# Patient Record
Sex: Female | Born: 1945 | Race: Black or African American | Hispanic: No | Smoking: Never smoker
Health system: Southern US, Community
[De-identification: ages and names within clinical notes are randomized; demographics above are authoritative.]

## PROBLEM LIST (undated history)

## (undated) ENCOUNTER — Inpatient Hospital Stay (HOSPITAL_COMMUNITY): Payer: Medicare Other

## (undated) DIAGNOSIS — H409 Unspecified glaucoma: Secondary | ICD-10-CM

## (undated) DIAGNOSIS — R42 Dizziness and giddiness: Secondary | ICD-10-CM

## (undated) DIAGNOSIS — Z5189 Encounter for other specified aftercare: Secondary | ICD-10-CM

## (undated) DIAGNOSIS — C189 Malignant neoplasm of colon, unspecified: Secondary | ICD-10-CM

## (undated) DIAGNOSIS — K2970 Gastritis, unspecified, without bleeding: Secondary | ICD-10-CM

## (undated) DIAGNOSIS — D649 Anemia, unspecified: Secondary | ICD-10-CM

## (undated) DIAGNOSIS — I10 Essential (primary) hypertension: Secondary | ICD-10-CM

## (undated) DIAGNOSIS — K219 Gastro-esophageal reflux disease without esophagitis: Secondary | ICD-10-CM

## (undated) DIAGNOSIS — E7800 Pure hypercholesterolemia, unspecified: Secondary | ICD-10-CM

## (undated) DIAGNOSIS — T7840XA Allergy, unspecified, initial encounter: Secondary | ICD-10-CM

## (undated) DIAGNOSIS — N8110 Cystocele, unspecified: Secondary | ICD-10-CM

## (undated) DIAGNOSIS — J45909 Unspecified asthma, uncomplicated: Secondary | ICD-10-CM

## (undated) DIAGNOSIS — N189 Chronic kidney disease, unspecified: Secondary | ICD-10-CM

## (undated) DIAGNOSIS — D332 Benign neoplasm of brain, unspecified: Secondary | ICD-10-CM

## (undated) HISTORY — DX: Allergy, unspecified, initial encounter: T78.40XA

## (undated) HISTORY — DX: Anemia, unspecified: D64.9

## (undated) HISTORY — PX: CATARACT EXTRACTION, BILATERAL: SHX1313

## (undated) HISTORY — PX: COLONOSCOPY W/ BIOPSIES: SHX1374

## (undated) HISTORY — DX: Dizziness and giddiness: R42

## (undated) HISTORY — DX: Unspecified glaucoma: H40.9

## (undated) HISTORY — DX: Gastro-esophageal reflux disease without esophagitis: K21.9

## (undated) HISTORY — DX: Unspecified asthma, uncomplicated: J45.909

## (undated) HISTORY — DX: Cystocele, unspecified: N81.10

## (undated) HISTORY — DX: Essential (primary) hypertension: I10

## (undated) HISTORY — DX: Pure hypercholesterolemia, unspecified: E78.00

## (undated) HISTORY — PX: EYE SURGERY: SHX253

## (undated) HISTORY — PX: OTHER SURGICAL HISTORY: SHX169

## (undated) HISTORY — PX: UPPER GASTROINTESTINAL ENDOSCOPY: SHX188

## (undated) HISTORY — PX: TMJ ARTHROPLASTY: SHX1066

## (undated) HISTORY — DX: Encounter for other specified aftercare: Z51.89

## (undated) HISTORY — DX: Gastritis, unspecified, without bleeding: K29.70

## (undated) HISTORY — DX: Benign neoplasm of brain, unspecified: D33.2

## (undated) HISTORY — PX: COLONOSCOPY: SHX174

## (undated) HISTORY — DX: Malignant neoplasm of colon, unspecified: C18.9

---

## 1974-04-11 HISTORY — PX: ABDOMINAL HYSTERECTOMY: SHX81

## 1995-04-12 HISTORY — PX: LAPAROSCOPIC NISSEN FUNDOPLICATION: SHX1932

## 1998-07-15 ENCOUNTER — Encounter: Payer: Self-pay | Admitting: Emergency Medicine

## 1998-07-15 ENCOUNTER — Emergency Department (HOSPITAL_COMMUNITY): Admission: EM | Admit: 1998-07-15 | Discharge: 1998-07-15 | Payer: Self-pay | Admitting: Emergency Medicine

## 1999-04-26 ENCOUNTER — Emergency Department (HOSPITAL_COMMUNITY): Admission: EM | Admit: 1999-04-26 | Discharge: 1999-04-26 | Payer: Self-pay | Admitting: Emergency Medicine

## 1999-04-29 ENCOUNTER — Encounter: Admission: RE | Admit: 1999-04-29 | Discharge: 1999-04-29 | Payer: Self-pay | Admitting: Occupational Medicine

## 1999-04-29 ENCOUNTER — Encounter: Payer: Self-pay | Admitting: Occupational Medicine

## 1999-06-02 ENCOUNTER — Encounter: Admission: RE | Admit: 1999-06-02 | Discharge: 1999-06-22 | Payer: Self-pay | Admitting: Occupational Medicine

## 1999-06-21 ENCOUNTER — Encounter: Admission: RE | Admit: 1999-06-21 | Discharge: 1999-06-21 | Payer: Self-pay | Admitting: *Deleted

## 1999-06-21 ENCOUNTER — Encounter: Payer: Self-pay | Admitting: *Deleted

## 1999-08-04 ENCOUNTER — Encounter: Payer: Self-pay | Admitting: Emergency Medicine

## 1999-08-04 ENCOUNTER — Emergency Department (HOSPITAL_COMMUNITY): Admission: EM | Admit: 1999-08-04 | Discharge: 1999-08-04 | Payer: Self-pay | Admitting: Emergency Medicine

## 1999-08-06 ENCOUNTER — Encounter: Admission: RE | Admit: 1999-08-06 | Discharge: 1999-08-06 | Payer: Self-pay | Admitting: Family Medicine

## 1999-08-06 ENCOUNTER — Ambulatory Visit (HOSPITAL_COMMUNITY): Admission: RE | Admit: 1999-08-06 | Discharge: 1999-08-06 | Payer: Self-pay | Admitting: Family Medicine

## 1999-08-11 ENCOUNTER — Encounter: Admission: RE | Admit: 1999-08-11 | Discharge: 1999-11-09 | Payer: Self-pay | Admitting: *Deleted

## 1999-08-20 ENCOUNTER — Encounter: Admission: RE | Admit: 1999-08-20 | Discharge: 1999-08-20 | Payer: Self-pay | Admitting: Family Medicine

## 1999-08-30 ENCOUNTER — Encounter: Admission: RE | Admit: 1999-08-30 | Discharge: 1999-08-30 | Payer: Self-pay | Admitting: Family Medicine

## 1999-08-31 ENCOUNTER — Encounter: Admission: RE | Admit: 1999-08-31 | Discharge: 1999-08-31 | Payer: Self-pay | Admitting: Sports Medicine

## 1999-09-28 ENCOUNTER — Ambulatory Visit (HOSPITAL_COMMUNITY): Admission: RE | Admit: 1999-09-28 | Discharge: 1999-09-28 | Payer: Self-pay | Admitting: Gastroenterology

## 1999-11-23 ENCOUNTER — Emergency Department (HOSPITAL_COMMUNITY): Admission: EM | Admit: 1999-11-23 | Discharge: 1999-11-23 | Payer: Self-pay | Admitting: Emergency Medicine

## 1999-12-01 ENCOUNTER — Ambulatory Visit (HOSPITAL_COMMUNITY): Admission: RE | Admit: 1999-12-01 | Discharge: 1999-12-01 | Payer: Self-pay | Admitting: Gastroenterology

## 1999-12-01 ENCOUNTER — Encounter: Payer: Self-pay | Admitting: Gastroenterology

## 2000-01-27 ENCOUNTER — Ambulatory Visit (HOSPITAL_COMMUNITY): Admission: RE | Admit: 2000-01-27 | Discharge: 2000-01-27 | Payer: Self-pay | Admitting: Gastroenterology

## 2000-01-27 ENCOUNTER — Encounter: Payer: Self-pay | Admitting: Gastroenterology

## 2000-08-17 ENCOUNTER — Encounter: Admission: RE | Admit: 2000-08-17 | Discharge: 2000-08-17 | Payer: Self-pay | Admitting: Family Medicine

## 2000-12-01 ENCOUNTER — Encounter: Admission: RE | Admit: 2000-12-01 | Discharge: 2000-12-01 | Payer: Self-pay | Admitting: Family Medicine

## 2001-01-10 ENCOUNTER — Encounter: Admission: RE | Admit: 2001-01-10 | Discharge: 2001-01-10 | Payer: Self-pay | Admitting: Sports Medicine

## 2001-01-31 ENCOUNTER — Encounter: Payer: Self-pay | Admitting: Sports Medicine

## 2001-01-31 ENCOUNTER — Encounter: Admission: RE | Admit: 2001-01-31 | Discharge: 2001-01-31 | Payer: Self-pay | Admitting: Family Medicine

## 2001-01-31 ENCOUNTER — Encounter: Admission: RE | Admit: 2001-01-31 | Discharge: 2001-01-31 | Payer: Self-pay | Admitting: Sports Medicine

## 2001-02-15 ENCOUNTER — Encounter: Admission: RE | Admit: 2001-02-15 | Discharge: 2001-02-15 | Payer: Self-pay | Admitting: Family Medicine

## 2001-02-20 ENCOUNTER — Encounter: Admission: RE | Admit: 2001-02-20 | Discharge: 2001-02-20 | Payer: Self-pay | Admitting: Sports Medicine

## 2001-02-20 ENCOUNTER — Encounter: Payer: Self-pay | Admitting: Sports Medicine

## 2001-05-07 ENCOUNTER — Encounter: Admission: RE | Admit: 2001-05-07 | Discharge: 2001-05-07 | Payer: Self-pay | Admitting: Sports Medicine

## 2001-05-07 ENCOUNTER — Inpatient Hospital Stay (HOSPITAL_COMMUNITY): Admission: AD | Admit: 2001-05-07 | Discharge: 2001-05-09 | Payer: Self-pay | Admitting: Family Medicine

## 2001-05-07 ENCOUNTER — Ambulatory Visit (HOSPITAL_COMMUNITY): Admission: RE | Admit: 2001-05-07 | Discharge: 2001-05-07 | Payer: Self-pay | Admitting: Sports Medicine

## 2001-05-09 ENCOUNTER — Ambulatory Visit (HOSPITAL_COMMUNITY): Admission: RE | Admit: 2001-05-09 | Discharge: 2001-05-09 | Payer: Self-pay | Admitting: Sports Medicine

## 2001-05-14 ENCOUNTER — Encounter: Payer: Self-pay | Admitting: Emergency Medicine

## 2001-05-14 ENCOUNTER — Emergency Department (HOSPITAL_COMMUNITY): Admission: EM | Admit: 2001-05-14 | Discharge: 2001-05-14 | Payer: Self-pay | Admitting: Emergency Medicine

## 2001-05-16 ENCOUNTER — Encounter: Admission: RE | Admit: 2001-05-16 | Discharge: 2001-05-16 | Payer: Self-pay | Admitting: Family Medicine

## 2001-05-23 ENCOUNTER — Encounter: Admission: RE | Admit: 2001-05-23 | Discharge: 2001-05-23 | Payer: Self-pay | Admitting: Family Medicine

## 2001-06-20 ENCOUNTER — Encounter: Admission: RE | Admit: 2001-06-20 | Discharge: 2001-06-20 | Payer: Self-pay | Admitting: Family Medicine

## 2001-07-16 ENCOUNTER — Encounter: Admission: RE | Admit: 2001-07-16 | Discharge: 2001-07-16 | Payer: Self-pay | Admitting: Family Medicine

## 2001-07-26 ENCOUNTER — Encounter: Admission: RE | Admit: 2001-07-26 | Discharge: 2001-07-26 | Payer: Self-pay | Admitting: Sports Medicine

## 2001-08-09 ENCOUNTER — Emergency Department (HOSPITAL_COMMUNITY): Admission: EM | Admit: 2001-08-09 | Discharge: 2001-08-09 | Payer: Self-pay | Admitting: Emergency Medicine

## 2001-10-23 DIAGNOSIS — K621 Rectal polyp: Secondary | ICD-10-CM

## 2001-10-23 DIAGNOSIS — K620 Anal polyp: Secondary | ICD-10-CM | POA: Insufficient documentation

## 2001-10-23 DIAGNOSIS — K644 Residual hemorrhoidal skin tags: Secondary | ICD-10-CM

## 2001-10-23 HISTORY — DX: Residual hemorrhoidal skin tags: K64.4

## 2002-02-28 ENCOUNTER — Ambulatory Visit (HOSPITAL_COMMUNITY): Admission: RE | Admit: 2002-02-28 | Discharge: 2002-02-28 | Payer: Self-pay | Admitting: Family Medicine

## 2002-02-28 ENCOUNTER — Encounter: Admission: RE | Admit: 2002-02-28 | Discharge: 2002-02-28 | Payer: Self-pay | Admitting: Family Medicine

## 2002-03-15 ENCOUNTER — Encounter: Admission: RE | Admit: 2002-03-15 | Discharge: 2002-03-15 | Payer: Self-pay | Admitting: Family Medicine

## 2002-03-15 ENCOUNTER — Encounter: Payer: Self-pay | Admitting: Family Medicine

## 2002-04-15 ENCOUNTER — Encounter: Admission: RE | Admit: 2002-04-15 | Discharge: 2002-04-15 | Payer: Self-pay | Admitting: Family Medicine

## 2002-04-23 ENCOUNTER — Encounter: Admission: RE | Admit: 2002-04-23 | Discharge: 2002-04-23 | Payer: Self-pay | Admitting: Family Medicine

## 2002-09-05 ENCOUNTER — Encounter: Admission: RE | Admit: 2002-09-05 | Discharge: 2002-09-05 | Payer: Self-pay | Admitting: Family Medicine

## 2002-09-12 ENCOUNTER — Encounter: Admission: RE | Admit: 2002-09-12 | Discharge: 2002-09-12 | Payer: Self-pay | Admitting: Sports Medicine

## 2002-09-13 ENCOUNTER — Ambulatory Visit (HOSPITAL_COMMUNITY): Admission: RE | Admit: 2002-09-13 | Discharge: 2002-09-13 | Payer: Self-pay | Admitting: Sports Medicine

## 2002-09-19 ENCOUNTER — Encounter: Admission: RE | Admit: 2002-09-19 | Discharge: 2002-09-19 | Payer: Self-pay | Admitting: Family Medicine

## 2002-10-02 ENCOUNTER — Encounter: Admission: RE | Admit: 2002-10-02 | Discharge: 2002-10-02 | Payer: Self-pay | Admitting: Family Medicine

## 2003-01-07 ENCOUNTER — Encounter: Admission: RE | Admit: 2003-01-07 | Discharge: 2003-01-07 | Payer: Self-pay | Admitting: Sports Medicine

## 2003-02-21 ENCOUNTER — Inpatient Hospital Stay (HOSPITAL_COMMUNITY): Admission: AD | Admit: 2003-02-21 | Discharge: 2003-02-23 | Payer: Self-pay | Admitting: Family Medicine

## 2003-02-21 ENCOUNTER — Encounter: Admission: RE | Admit: 2003-02-21 | Discharge: 2003-02-21 | Payer: Self-pay | Admitting: Family Medicine

## 2003-03-03 ENCOUNTER — Encounter: Admission: RE | Admit: 2003-03-03 | Discharge: 2003-03-03 | Payer: Self-pay | Admitting: Family Medicine

## 2003-03-10 ENCOUNTER — Encounter: Admission: RE | Admit: 2003-03-10 | Discharge: 2003-03-10 | Payer: Self-pay | Admitting: Family Medicine

## 2003-03-21 ENCOUNTER — Encounter: Admission: RE | Admit: 2003-03-21 | Discharge: 2003-03-21 | Payer: Self-pay | Admitting: Sports Medicine

## 2003-04-12 HISTORY — PX: CARDIAC CATHETERIZATION: SHX172

## 2003-09-16 ENCOUNTER — Emergency Department (HOSPITAL_COMMUNITY): Admission: EM | Admit: 2003-09-16 | Discharge: 2003-09-16 | Payer: Self-pay | Admitting: Emergency Medicine

## 2004-03-26 ENCOUNTER — Ambulatory Visit: Payer: Self-pay | Admitting: Sports Medicine

## 2004-03-26 ENCOUNTER — Ambulatory Visit (HOSPITAL_COMMUNITY): Admission: RE | Admit: 2004-03-26 | Discharge: 2004-03-26 | Payer: Self-pay | Admitting: Sports Medicine

## 2004-03-30 ENCOUNTER — Ambulatory Visit: Payer: Self-pay | Admitting: Sports Medicine

## 2004-03-30 ENCOUNTER — Ambulatory Visit (HOSPITAL_COMMUNITY): Admission: RE | Admit: 2004-03-30 | Discharge: 2004-03-30 | Payer: Self-pay | Admitting: Sports Medicine

## 2004-03-30 ENCOUNTER — Ambulatory Visit: Payer: Self-pay | Admitting: Internal Medicine

## 2004-03-31 ENCOUNTER — Ambulatory Visit (HOSPITAL_COMMUNITY): Admission: RE | Admit: 2004-03-31 | Discharge: 2004-03-31 | Payer: Self-pay | Admitting: Internal Medicine

## 2004-03-31 ENCOUNTER — Ambulatory Visit: Payer: Self-pay | Admitting: Cardiology

## 2004-04-15 ENCOUNTER — Ambulatory Visit: Payer: Self-pay | Admitting: Family Medicine

## 2004-04-16 ENCOUNTER — Encounter: Admission: RE | Admit: 2004-04-16 | Discharge: 2004-04-16 | Payer: Self-pay | Admitting: Sports Medicine

## 2004-04-29 ENCOUNTER — Ambulatory Visit: Payer: Self-pay | Admitting: Family Medicine

## 2004-04-29 ENCOUNTER — Encounter: Admission: RE | Admit: 2004-04-29 | Discharge: 2004-04-29 | Payer: Self-pay | Admitting: Sports Medicine

## 2004-05-13 ENCOUNTER — Ambulatory Visit: Payer: Self-pay | Admitting: Family Medicine

## 2004-06-04 ENCOUNTER — Ambulatory Visit: Payer: Self-pay | Admitting: Family Medicine

## 2004-06-07 ENCOUNTER — Encounter: Admission: RE | Admit: 2004-06-07 | Discharge: 2004-06-07 | Payer: Self-pay | Admitting: Family Medicine

## 2004-06-21 ENCOUNTER — Ambulatory Visit: Payer: Self-pay | Admitting: Sports Medicine

## 2004-12-17 ENCOUNTER — Ambulatory Visit: Payer: Self-pay | Admitting: Family Medicine

## 2004-12-23 ENCOUNTER — Ambulatory Visit (HOSPITAL_COMMUNITY): Admission: RE | Admit: 2004-12-23 | Discharge: 2004-12-23 | Payer: Self-pay | Admitting: Family Medicine

## 2004-12-23 ENCOUNTER — Encounter (INDEPENDENT_AMBULATORY_CARE_PROVIDER_SITE_OTHER): Payer: Self-pay | Admitting: *Deleted

## 2004-12-23 DIAGNOSIS — Z5189 Encounter for other specified aftercare: Secondary | ICD-10-CM

## 2004-12-23 HISTORY — DX: Encounter for other specified aftercare: Z51.89

## 2004-12-27 ENCOUNTER — Encounter: Admission: RE | Admit: 2004-12-27 | Discharge: 2004-12-27 | Payer: Self-pay | Admitting: Gastroenterology

## 2004-12-27 ENCOUNTER — Encounter: Payer: Self-pay | Admitting: Internal Medicine

## 2005-01-07 ENCOUNTER — Ambulatory Visit: Payer: Self-pay | Admitting: Family Medicine

## 2005-01-18 ENCOUNTER — Ambulatory Visit: Payer: Self-pay | Admitting: Sports Medicine

## 2005-03-07 ENCOUNTER — Encounter (INDEPENDENT_AMBULATORY_CARE_PROVIDER_SITE_OTHER): Payer: Self-pay | Admitting: *Deleted

## 2005-03-07 ENCOUNTER — Inpatient Hospital Stay (HOSPITAL_COMMUNITY): Admission: RE | Admit: 2005-03-07 | Discharge: 2005-03-15 | Payer: Self-pay | Admitting: General Surgery

## 2005-03-07 DIAGNOSIS — C189 Malignant neoplasm of colon, unspecified: Secondary | ICD-10-CM

## 2005-03-07 HISTORY — PX: RIGHT COLECTOMY: SHX853

## 2005-03-07 HISTORY — DX: Malignant neoplasm of colon, unspecified: C18.9

## 2005-03-29 ENCOUNTER — Ambulatory Visit: Payer: Self-pay | Admitting: Hematology and Oncology

## 2005-04-18 ENCOUNTER — Ambulatory Visit (HOSPITAL_COMMUNITY): Admission: RE | Admit: 2005-04-18 | Discharge: 2005-04-18 | Payer: Self-pay | Admitting: General Surgery

## 2005-04-22 ENCOUNTER — Ambulatory Visit (HOSPITAL_COMMUNITY): Admission: RE | Admit: 2005-04-22 | Discharge: 2005-04-22 | Payer: Self-pay | Admitting: Hematology and Oncology

## 2005-05-13 ENCOUNTER — Ambulatory Visit: Payer: Self-pay | Admitting: Hematology and Oncology

## 2005-07-01 ENCOUNTER — Ambulatory Visit: Payer: Self-pay | Admitting: Family Medicine

## 2005-07-05 ENCOUNTER — Ambulatory Visit: Payer: Self-pay | Admitting: Hematology and Oncology

## 2005-07-20 LAB — CBC WITH DIFFERENTIAL/PLATELET
BASO%: 1 % (ref 0.0–2.0)
Basophils Absolute: 0.1 10*3/uL (ref 0.0–0.1)
EOS%: 0.7 % (ref 0.0–7.0)
Eosinophils Absolute: 0.1 10*3/uL (ref 0.0–0.5)
HCT: 42.1 % (ref 34.8–46.6)
HGB: 14.3 g/dL (ref 11.6–15.9)
LYMPH%: 31 % (ref 14.0–48.0)
MCH: 30.9 pg (ref 26.0–34.0)
MCHC: 33.9 g/dL (ref 32.0–36.0)
MCV: 0 fL — ABNORMAL LOW (ref 81.0–101.0)
MONO#: 0.8 10*3/uL (ref 0.1–0.9)
MONO%: 8.4 % (ref 0.0–13.0)
NEUT#: 5.4 10*3/uL (ref 1.5–6.5)
NEUT%: 58.8 % (ref 39.6–76.8)
Platelets: ADEQUATE 10*3/uL (ref 145–400)
RBC: 4.61 10*6/uL (ref 3.70–5.32)
RDW: 14.5 % (ref 11.3–14.5)
WBC: 9.2 10*3/uL (ref 3.9–10.0)
lymph#: 2.9 10*3/uL (ref 0.9–3.3)

## 2005-07-20 LAB — COMPREHENSIVE METABOLIC PANEL
ALT: <8 U/L (ref 0–40)
AST: 11 U/L (ref 0–37)
Albumin: 4.2 g/dL (ref 3.5–5.2)
Alkaline Phosphatase: 95 U/L (ref 39–117)
BUN: 7 mg/dL (ref 6–23)
CO2: 26 mEq/L (ref 19–32)
Calcium: 9.3 mg/dL (ref 8.4–10.5)
Chloride: 101 mEq/L (ref 96–112)
Creatinine, Ser: 0.6 mg/dL (ref 0.4–1.2)
Glucose, Bld: 177 mg/dL — ABNORMAL HIGH (ref 70–99)
Potassium: 3.4 mEq/L — ABNORMAL LOW (ref 3.5–5.3)
Sodium: 141 mEq/L (ref 135–145)
Total Bilirubin: 0.6 mg/dL (ref 0.3–1.2)
Total Protein: 6.8 g/dL (ref 6.0–8.3)

## 2005-07-20 LAB — MAGNESIUM: Magnesium: 1.8 mg/dL (ref 1.5–2.5)

## 2005-08-03 LAB — CBC WITH DIFFERENTIAL/PLATELET
BASO%: 0.4 % (ref 0.0–2.0)
Basophils Absolute: 0 10*3/uL (ref 0.0–0.1)
EOS%: 1.1 % (ref 0.0–7.0)
Eosinophils Absolute: 0.1 10*3/uL (ref 0.0–0.5)
HCT: 39.5 % (ref 34.8–46.6)
HGB: 13.3 g/dL (ref 11.6–15.9)
LYMPH%: 29.5 % (ref 14.0–48.0)
MCH: 30.7 pg (ref 26.0–34.0)
MCHC: 33.6 g/dL (ref 32.0–36.0)
MCV: 91.4 fL (ref 81.0–101.0)
MONO#: 0.7 10*3/uL (ref 0.1–0.9)
MONO%: 7.3 % (ref 0.0–13.0)
NEUT#: 5.6 10*3/uL (ref 1.5–6.5)
NEUT%: 61.7 % (ref 39.6–76.8)
Platelets: 252 10*3/uL (ref 145–400)
RBC: 4.32 10*6/uL (ref 3.70–5.32)
RDW: 15 % — ABNORMAL HIGH (ref 11.3–14.5)
WBC: 9 10*3/uL (ref 3.9–10.0)
lymph#: 2.7 10*3/uL (ref 0.9–3.3)

## 2005-08-03 LAB — COMPREHENSIVE METABOLIC PANEL
ALT: 8 U/L (ref 0–40)
AST: 10 U/L (ref 0–37)
Albumin: 4 g/dL (ref 3.5–5.2)
Alkaline Phosphatase: 100 U/L (ref 39–117)
BUN: 11 mg/dL (ref 6–23)
CO2: 27 mEq/L (ref 19–32)
Calcium: 9.5 mg/dL (ref 8.4–10.5)
Chloride: 102 mEq/L (ref 96–112)
Creatinine, Ser: 0.7 mg/dL (ref 0.4–1.2)
Glucose, Bld: 211 mg/dL — ABNORMAL HIGH (ref 70–99)
Potassium: 4.1 mEq/L (ref 3.5–5.3)
Sodium: 141 mEq/L (ref 135–145)
Total Bilirubin: 0.6 mg/dL (ref 0.3–1.2)
Total Protein: 6.7 g/dL (ref 6.0–8.3)

## 2005-08-03 LAB — MAGNESIUM: Magnesium: 1.7 mg/dL (ref 1.5–2.5)

## 2005-08-17 LAB — CBC WITH DIFFERENTIAL/PLATELET
BASO%: 0.4 % (ref 0.0–2.0)
Basophils Absolute: 0 10*3/uL (ref 0.0–0.1)
EOS%: 0.8 % (ref 0.0–7.0)
Eosinophils Absolute: 0.1 10*3/uL (ref 0.0–0.5)
HCT: 37.6 % (ref 34.8–46.6)
HGB: 12.4 g/dL (ref 11.6–15.9)
LYMPH%: 24.9 % (ref 14.0–48.0)
MCH: 30.4 pg (ref 26.0–34.0)
MCHC: 33.1 g/dL (ref 32.0–36.0)
MCV: 91.8 fL (ref 81.0–101.0)
MONO#: 0.8 10*3/uL (ref 0.1–0.9)
MONO%: 9.4 % (ref 0.0–13.0)
NEUT#: 5.6 10*3/uL (ref 1.5–6.5)
NEUT%: 64.5 % (ref 39.6–76.8)
Platelets: 249 10*3/uL (ref 145–400)
RBC: 4.09 10*6/uL (ref 3.70–5.32)
RDW: 15.6 % — ABNORMAL HIGH (ref 11.3–14.5)
WBC: 8.7 10*3/uL (ref 3.9–10.0)
lymph#: 2.2 10*3/uL (ref 0.9–3.3)

## 2005-08-17 LAB — COMPREHENSIVE METABOLIC PANEL
ALT: <8 U/L (ref 0–40)
AST: 12 U/L (ref 0–37)
Albumin: 3.9 g/dL (ref 3.5–5.2)
Alkaline Phosphatase: 89 U/L (ref 39–117)
BUN: 11 mg/dL (ref 6–23)
CO2: 26 mEq/L (ref 19–32)
Calcium: 9.3 mg/dL (ref 8.4–10.5)
Chloride: 104 mEq/L (ref 96–112)
Creatinine, Ser: 0.6 mg/dL (ref 0.4–1.2)
Glucose, Bld: 207 mg/dL — ABNORMAL HIGH (ref 70–99)
Potassium: 3.7 mEq/L (ref 3.5–5.3)
Sodium: 141 mEq/L (ref 135–145)
Total Bilirubin: 0.6 mg/dL (ref 0.3–1.2)
Total Protein: 6.5 g/dL (ref 6.0–8.3)

## 2005-08-17 LAB — MAGNESIUM: Magnesium: 1.8 mg/dL (ref 1.5–2.5)

## 2005-08-19 ENCOUNTER — Ambulatory Visit: Payer: Self-pay | Admitting: Hematology and Oncology

## 2005-08-31 LAB — CBC WITH DIFFERENTIAL/PLATELET
BASO%: 0.4 % (ref 0.0–2.0)
Basophils Absolute: 0 10*3/uL (ref 0.0–0.1)
EOS%: 1.4 % (ref 0.0–7.0)
Eosinophils Absolute: 0.1 10*3/uL (ref 0.0–0.5)
HCT: 39 % (ref 34.8–46.6)
HGB: 12.9 g/dL (ref 11.6–15.9)
LYMPH%: 24.7 % (ref 14.0–48.0)
MCH: 30.3 pg (ref 26.0–34.0)
MCHC: 33.1 g/dL (ref 32.0–36.0)
MCV: 91.3 fL (ref 81.0–101.0)
MONO#: 0.6 10*3/uL (ref 0.1–0.9)
MONO%: 7.6 % (ref 0.0–13.0)
NEUT#: 5.4 10*3/uL (ref 1.5–6.5)
NEUT%: 65.9 % (ref 39.6–76.8)
Platelets: 258 10*3/uL (ref 145–400)
RBC: 4.27 10*6/uL (ref 3.70–5.32)
RDW: 15.1 % — ABNORMAL HIGH (ref 11.3–14.5)
WBC: 8.2 10*3/uL (ref 3.9–10.0)
lymph#: 2 10*3/uL (ref 0.9–3.3)

## 2005-08-31 LAB — COMPREHENSIVE METABOLIC PANEL
ALT: <8 U/L (ref 0–40)
AST: 9 U/L (ref 0–37)
Albumin: 3.8 g/dL (ref 3.5–5.2)
Alkaline Phosphatase: 104 U/L (ref 39–117)
BUN: 13 mg/dL (ref 6–23)
CO2: 27 mEq/L (ref 19–32)
Calcium: 8.8 mg/dL (ref 8.4–10.5)
Chloride: 104 mEq/L (ref 96–112)
Creatinine, Ser: 0.7 mg/dL (ref 0.4–1.2)
Glucose, Bld: 223 mg/dL — ABNORMAL HIGH (ref 70–99)
Potassium: 3.8 mEq/L (ref 3.5–5.3)
Sodium: 142 mEq/L (ref 135–145)
Total Bilirubin: 0.8 mg/dL (ref 0.3–1.2)
Total Protein: 6.4 g/dL (ref 6.0–8.3)

## 2005-09-14 LAB — COMPREHENSIVE METABOLIC PANEL
ALT: 8 U/L (ref 0–40)
AST: 10 U/L (ref 0–37)
Albumin: 4.1 g/dL (ref 3.5–5.2)
Alkaline Phosphatase: 100 U/L (ref 39–117)
BUN: 14 mg/dL (ref 6–23)
CO2: 26 mEq/L (ref 19–32)
Calcium: 9.4 mg/dL (ref 8.4–10.5)
Chloride: 105 mEq/L (ref 96–112)
Creatinine, Ser: 0.71 mg/dL (ref 0.40–1.20)
Glucose, Bld: 220 mg/dL — ABNORMAL HIGH (ref 70–99)
Potassium: 3.3 mEq/L — ABNORMAL LOW (ref 3.5–5.3)
Sodium: 143 mEq/L (ref 135–145)
Total Bilirubin: 0.8 mg/dL (ref 0.3–1.2)
Total Protein: 6.8 g/dL (ref 6.0–8.3)

## 2005-09-14 LAB — CBC WITH DIFFERENTIAL/PLATELET
BASO%: 0.4 % (ref 0.0–2.0)
Basophils Absolute: 0 10*3/uL (ref 0.0–0.1)
EOS%: 1.2 % (ref 0.0–7.0)
Eosinophils Absolute: 0.1 10*3/uL (ref 0.0–0.5)
HCT: 39.5 % (ref 34.8–46.6)
HGB: 13.2 g/dL (ref 11.6–15.9)
LYMPH%: 22.6 % (ref 14.0–48.0)
MCH: 30.3 pg (ref 26.0–34.0)
MCHC: 33.3 g/dL (ref 32.0–36.0)
MCV: 90.8 fL (ref 81.0–101.0)
MONO#: 0.7 10*3/uL (ref 0.1–0.9)
MONO%: 7.9 % (ref 0.0–13.0)
NEUT#: 5.8 10*3/uL (ref 1.5–6.5)
NEUT%: 67.9 % (ref 39.6–76.8)
Platelets: 261 10*3/uL (ref 145–400)
RBC: 4.35 10*6/uL (ref 3.70–5.32)
RDW: 14.9 % — ABNORMAL HIGH (ref 11.3–14.5)
WBC: 8.5 10*3/uL (ref 3.9–10.0)
lymph#: 1.9 10*3/uL (ref 0.9–3.3)

## 2005-09-28 LAB — CBC WITH DIFFERENTIAL/PLATELET
BASO%: 0.4 % (ref 0.0–2.0)
Basophils Absolute: 0 10*3/uL (ref 0.0–0.1)
EOS%: 1.1 % (ref 0.0–7.0)
Eosinophils Absolute: 0.1 10*3/uL (ref 0.0–0.5)
HCT: 40 % (ref 34.8–46.6)
HGB: 13.5 g/dL (ref 11.6–15.9)
LYMPH%: 25.1 % (ref 14.0–48.0)
MCH: 30.8 pg (ref 26.0–34.0)
MCHC: 33.8 g/dL (ref 32.0–36.0)
MCV: 91.1 fL (ref 81.0–101.0)
MONO#: 0.6 10*3/uL (ref 0.1–0.9)
MONO%: 8.6 % (ref 0.0–13.0)
NEUT#: 4.9 10*3/uL (ref 1.5–6.5)
NEUT%: 64.8 % (ref 39.6–76.8)
Platelets: 252 10*3/uL (ref 145–400)
RBC: 4.39 10*6/uL (ref 3.70–5.32)
RDW: 15.3 % — ABNORMAL HIGH (ref 11.3–14.5)
WBC: 7.5 10*3/uL (ref 3.9–10.0)
lymph#: 1.9 10*3/uL (ref 0.9–3.3)

## 2005-09-28 LAB — COMPREHENSIVE METABOLIC PANEL
ALT: <8 U/L (ref 0–40)
AST: 11 U/L (ref 0–37)
Albumin: 4 g/dL (ref 3.5–5.2)
Alkaline Phosphatase: 94 U/L (ref 39–117)
BUN: 10 mg/dL (ref 6–23)
CO2: 27 mEq/L (ref 19–32)
Calcium: 9.3 mg/dL (ref 8.4–10.5)
Chloride: 103 mEq/L (ref 96–112)
Creatinine, Ser: 0.66 mg/dL (ref 0.40–1.20)
Glucose, Bld: 246 mg/dL — ABNORMAL HIGH (ref 70–99)
Potassium: 3.7 mEq/L (ref 3.5–5.3)
Sodium: 141 mEq/L (ref 135–145)
Total Bilirubin: 1 mg/dL (ref 0.3–1.2)
Total Protein: 6.7 g/dL (ref 6.0–8.3)

## 2005-09-29 ENCOUNTER — Ambulatory Visit: Payer: Self-pay | Admitting: Hematology and Oncology

## 2005-10-11 LAB — COMPREHENSIVE METABOLIC PANEL
ALT: 8 U/L (ref 0–40)
AST: 10 U/L (ref 0–37)
Albumin: 4.2 g/dL (ref 3.5–5.2)
Alkaline Phosphatase: 103 U/L (ref 39–117)
BUN: 8 mg/dL (ref 6–23)
CO2: 26 mEq/L (ref 19–32)
Calcium: 9.6 mg/dL (ref 8.4–10.5)
Chloride: 103 mEq/L (ref 96–112)
Creatinine, Ser: 0.68 mg/dL (ref 0.40–1.20)
Glucose, Bld: 300 mg/dL — ABNORMAL HIGH (ref 70–99)
Potassium: 3.6 mEq/L (ref 3.5–5.3)
Sodium: 141 mEq/L (ref 135–145)
Total Bilirubin: 0.8 mg/dL (ref 0.3–1.2)
Total Protein: 7 g/dL (ref 6.0–8.3)

## 2005-10-11 LAB — CBC WITH DIFFERENTIAL/PLATELET
BASO%: 0.3 % (ref 0.0–2.0)
Basophils Absolute: 0 10*3/uL (ref 0.0–0.1)
EOS%: 0.7 % (ref 0.0–7.0)
Eosinophils Absolute: 0.1 10*3/uL (ref 0.0–0.5)
HCT: 40.3 % (ref 34.8–46.6)
HGB: 13.6 g/dL (ref 11.6–15.9)
LYMPH%: 23.5 % (ref 14.0–48.0)
MCH: 31.1 pg (ref 26.0–34.0)
MCHC: 33.7 g/dL (ref 32.0–36.0)
MCV: 92.3 fL (ref 81.0–101.0)
MONO#: 0.6 10*3/uL (ref 0.1–0.9)
MONO%: 8 % (ref 0.0–13.0)
NEUT#: 5.4 10*3/uL (ref 1.5–6.5)
NEUT%: 67.5 % (ref 39.6–76.8)
Platelets: 257 10*3/uL (ref 145–400)
RBC: 4.36 10*6/uL (ref 3.70–5.32)
RDW: 15.2 % — ABNORMAL HIGH (ref 11.3–14.5)
WBC: 8.1 10*3/uL (ref 3.9–10.0)
lymph#: 1.9 10*3/uL (ref 0.9–3.3)

## 2005-11-09 LAB — COMPREHENSIVE METABOLIC PANEL
ALT: 9 U/L (ref 0–40)
AST: 11 U/L (ref 0–37)
Albumin: 4.2 g/dL (ref 3.5–5.2)
Alkaline Phosphatase: 100 U/L (ref 39–117)
BUN: 10 mg/dL (ref 6–23)
CO2: 23 mEq/L (ref 19–32)
Calcium: 9.8 mg/dL (ref 8.4–10.5)
Chloride: 100 mEq/L (ref 96–112)
Creatinine, Ser: 0.64 mg/dL (ref 0.40–1.20)
Glucose, Bld: 357 mg/dL — ABNORMAL HIGH (ref 70–99)
Potassium: 3.2 mEq/L — ABNORMAL LOW (ref 3.5–5.3)
Sodium: 138 mEq/L (ref 135–145)
Total Bilirubin: 0.7 mg/dL (ref 0.3–1.2)
Total Protein: 6.7 g/dL (ref 6.0–8.3)

## 2005-11-09 LAB — CBC WITH DIFFERENTIAL/PLATELET
BASO%: 0.7 % (ref 0.0–2.0)
Basophils Absolute: 0.1 10*3/uL (ref 0.0–0.1)
EOS%: 0.7 % (ref 0.0–7.0)
Eosinophils Absolute: 0.1 10*3/uL (ref 0.0–0.5)
HCT: 40.9 % (ref 34.8–46.6)
HGB: 14.1 g/dL (ref 11.6–15.9)
LYMPH%: 27.1 % (ref 14.0–48.0)
MCH: 31.2 pg (ref 26.0–34.0)
MCHC: 34.3 g/dL (ref 32.0–36.0)
MCV: 90.8 fL (ref 81.0–101.0)
MONO#: 0.4 10*3/uL (ref 0.1–0.9)
MONO%: 5.5 % (ref 0.0–13.0)
NEUT#: 5 10*3/uL (ref 1.5–6.5)
NEUT%: 66 % (ref 39.6–76.8)
Platelets: 243 10*3/uL (ref 145–400)
RBC: 4.51 10*6/uL (ref 3.70–5.32)
RDW: 14.1 % (ref 11.3–14.5)
WBC: 7.5 10*3/uL (ref 3.9–10.0)
lymph#: 2 10*3/uL (ref 0.9–3.3)

## 2005-11-10 ENCOUNTER — Ambulatory Visit (HOSPITAL_COMMUNITY): Admission: RE | Admit: 2005-11-10 | Discharge: 2005-11-10 | Payer: Self-pay | Admitting: Hematology and Oncology

## 2005-11-16 ENCOUNTER — Ambulatory Visit: Payer: Self-pay | Admitting: Hematology and Oncology

## 2006-02-08 ENCOUNTER — Ambulatory Visit: Payer: Self-pay | Admitting: Hematology and Oncology

## 2006-03-21 LAB — CBC WITH DIFFERENTIAL/PLATELET
BASO%: 0.4 % (ref 0.0–2.0)
Basophils Absolute: 0 10*3/uL (ref 0.0–0.1)
EOS%: 1.5 % (ref 0.0–7.0)
Eosinophils Absolute: 0.1 10*3/uL (ref 0.0–0.5)
HCT: 39 % (ref 34.8–46.6)
HGB: 13.2 g/dL (ref 11.6–15.9)
LYMPH%: 27.8 % (ref 14.0–48.0)
MCH: 29.4 pg (ref 26.0–34.0)
MCHC: 33.9 g/dL (ref 32.0–36.0)
MCV: 86.7 fL (ref 81.0–101.0)
MONO#: 0.5 10*3/uL (ref 0.1–0.9)
MONO%: 6.2 % (ref 0.0–13.0)
NEUT#: 4.9 10*3/uL (ref 1.5–6.5)
NEUT%: 64.1 % (ref 39.6–76.8)
Platelets: 259 10*3/uL (ref 145–400)
RBC: 4.5 10*6/uL (ref 3.70–5.32)
RDW: 12.5 % (ref 11.3–14.5)
WBC: 7.6 10*3/uL (ref 3.9–10.0)
lymph#: 2.1 10*3/uL (ref 0.9–3.3)

## 2006-03-21 LAB — COMPREHENSIVE METABOLIC PANEL
ALT: 11 U/L (ref 0–35)
AST: 10 U/L (ref 0–37)
Albumin: 4.1 g/dL (ref 3.5–5.2)
Alkaline Phosphatase: 118 U/L — ABNORMAL HIGH (ref 39–117)
BUN: 14 mg/dL (ref 6–23)
CO2: 24 mEq/L (ref 19–32)
Calcium: 9 mg/dL (ref 8.4–10.5)
Chloride: 104 mEq/L (ref 96–112)
Creatinine, Ser: 0.63 mg/dL (ref 0.40–1.20)
Glucose, Bld: 287 mg/dL — ABNORMAL HIGH (ref 70–99)
Potassium: 3.7 mEq/L (ref 3.5–5.3)
Sodium: 139 mEq/L (ref 135–145)
Total Bilirubin: 0.4 mg/dL (ref 0.3–1.2)
Total Protein: 6.7 g/dL (ref 6.0–8.3)

## 2006-03-21 LAB — CEA: CEA: 1.5 ng/mL (ref 0.0–5.0)

## 2006-04-27 ENCOUNTER — Ambulatory Visit (HOSPITAL_COMMUNITY): Admission: RE | Admit: 2006-04-27 | Discharge: 2006-04-27 | Payer: Self-pay | Admitting: General Surgery

## 2006-05-31 ENCOUNTER — Ambulatory Visit: Payer: Self-pay | Admitting: Cardiology

## 2006-05-31 ENCOUNTER — Ambulatory Visit: Payer: Self-pay | Admitting: Family Medicine

## 2006-05-31 ENCOUNTER — Encounter: Payer: Self-pay | Admitting: Cardiology

## 2006-05-31 ENCOUNTER — Inpatient Hospital Stay (HOSPITAL_COMMUNITY): Admission: EM | Admit: 2006-05-31 | Discharge: 2006-06-01 | Payer: Self-pay | Admitting: Emergency Medicine

## 2006-05-31 ENCOUNTER — Ambulatory Visit: Payer: Self-pay | Admitting: Internal Medicine

## 2006-06-08 DIAGNOSIS — Z85038 Personal history of other malignant neoplasm of large intestine: Secondary | ICD-10-CM

## 2006-06-08 DIAGNOSIS — N6029 Fibroadenosis of unspecified breast: Secondary | ICD-10-CM | POA: Insufficient documentation

## 2006-06-08 DIAGNOSIS — F329 Major depressive disorder, single episode, unspecified: Secondary | ICD-10-CM | POA: Insufficient documentation

## 2006-06-08 DIAGNOSIS — F3289 Other specified depressive episodes: Secondary | ICD-10-CM

## 2006-06-08 DIAGNOSIS — F411 Generalized anxiety disorder: Secondary | ICD-10-CM | POA: Insufficient documentation

## 2006-06-08 DIAGNOSIS — K219 Gastro-esophageal reflux disease without esophagitis: Secondary | ICD-10-CM | POA: Insufficient documentation

## 2006-06-08 DIAGNOSIS — I2510 Atherosclerotic heart disease of native coronary artery without angina pectoris: Secondary | ICD-10-CM | POA: Insufficient documentation

## 2006-06-08 DIAGNOSIS — D509 Iron deficiency anemia, unspecified: Secondary | ICD-10-CM | POA: Insufficient documentation

## 2006-06-08 HISTORY — DX: Personal history of other malignant neoplasm of large intestine: Z85.038

## 2006-06-08 HISTORY — DX: Other specified depressive episodes: F32.89

## 2006-06-20 ENCOUNTER — Ambulatory Visit: Payer: Self-pay | Admitting: Cardiology

## 2006-06-28 ENCOUNTER — Ambulatory Visit: Payer: Self-pay

## 2006-07-11 ENCOUNTER — Ambulatory Visit: Payer: Self-pay | Admitting: Hematology and Oncology

## 2006-07-14 ENCOUNTER — Encounter (INDEPENDENT_AMBULATORY_CARE_PROVIDER_SITE_OTHER): Payer: Self-pay | Admitting: Family Medicine

## 2006-07-14 ENCOUNTER — Ambulatory Visit: Payer: Self-pay | Admitting: Family Medicine

## 2006-07-14 DIAGNOSIS — E781 Pure hyperglyceridemia: Secondary | ICD-10-CM | POA: Insufficient documentation

## 2006-07-14 DIAGNOSIS — I152 Hypertension secondary to endocrine disorders: Secondary | ICD-10-CM | POA: Insufficient documentation

## 2006-07-14 DIAGNOSIS — E1169 Type 2 diabetes mellitus with other specified complication: Secondary | ICD-10-CM | POA: Insufficient documentation

## 2006-07-14 DIAGNOSIS — J309 Allergic rhinitis, unspecified: Secondary | ICD-10-CM | POA: Insufficient documentation

## 2006-07-14 DIAGNOSIS — I10 Essential (primary) hypertension: Secondary | ICD-10-CM | POA: Insufficient documentation

## 2006-07-14 LAB — COMPREHENSIVE METABOLIC PANEL
ALT: 8 U/L (ref 0–35)
AST: 11 U/L (ref 0–37)
Albumin: 4 g/dL (ref 3.5–5.2)
Alkaline Phosphatase: 105 U/L (ref 39–117)
BUN: 19 mg/dL (ref 6–23)
CO2: 21 mEq/L (ref 19–32)
Calcium: 9.7 mg/dL (ref 8.4–10.5)
Chloride: 103 mEq/L (ref 96–112)
Creatinine, Ser: 0.74 mg/dL (ref 0.40–1.20)
Glucose, Bld: 354 mg/dL — ABNORMAL HIGH (ref 70–99)
Potassium: 3.7 mEq/L (ref 3.5–5.3)
Sodium: 138 mEq/L (ref 135–145)
Total Bilirubin: 0.5 mg/dL (ref 0.3–1.2)
Total Protein: 7 g/dL (ref 6.0–8.3)

## 2006-07-14 LAB — CBC WITH DIFFERENTIAL/PLATELET
BASO%: 0.5 % (ref 0.0–2.0)
Basophils Absolute: 0 10*3/uL (ref 0.0–0.1)
EOS%: 1.4 % (ref 0.0–7.0)
Eosinophils Absolute: 0.1 10*3/uL (ref 0.0–0.5)
HCT: 37.8 % (ref 34.8–46.6)
HGB: 13.2 g/dL (ref 11.6–15.9)
LYMPH%: 25.8 % (ref 14.0–48.0)
MCH: 29.4 pg (ref 26.0–34.0)
MCHC: 34.8 g/dL (ref 32.0–36.0)
MCV: 84.2 fL (ref 81.0–101.0)
MONO#: 0.4 10*3/uL (ref 0.1–0.9)
MONO%: 4.5 % (ref 0.0–13.0)
NEUT#: 5.6 10*3/uL (ref 1.5–6.5)
NEUT%: 67.8 % (ref 39.6–76.8)
Platelets: 249 10*3/uL (ref 145–400)
RBC: 4.48 10*6/uL (ref 3.70–5.32)
RDW: 12.6 % (ref 11.3–14.5)
WBC: 8.3 10*3/uL (ref 3.9–10.0)
lymph#: 2.1 10*3/uL (ref 0.9–3.3)

## 2006-07-14 LAB — CEA: CEA: 1.2 ng/mL (ref 0.0–5.0)

## 2006-07-17 ENCOUNTER — Ambulatory Visit (HOSPITAL_COMMUNITY): Admission: RE | Admit: 2006-07-17 | Discharge: 2006-07-17 | Payer: Self-pay | Admitting: Hematology and Oncology

## 2006-07-31 ENCOUNTER — Encounter (INDEPENDENT_AMBULATORY_CARE_PROVIDER_SITE_OTHER): Payer: Self-pay | Admitting: Family Medicine

## 2006-07-31 ENCOUNTER — Emergency Department (HOSPITAL_COMMUNITY): Admission: EM | Admit: 2006-07-31 | Discharge: 2006-07-31 | Payer: Self-pay | Admitting: Emergency Medicine

## 2006-07-31 LAB — COMPREHENSIVE METABOLIC PANEL
ALT: 9 U/L (ref 0–35)
AST: 12 U/L (ref 0–37)
Albumin: 4.4 g/dL (ref 3.5–5.2)
Alkaline Phosphatase: 121 U/L — ABNORMAL HIGH (ref 39–117)
BUN: 12 mg/dL (ref 6–23)
CO2: 23 mEq/L (ref 19–32)
Calcium: 9.7 mg/dL (ref 8.4–10.5)
Chloride: 102 mEq/L (ref 96–112)
Creatinine, Ser: 0.72 mg/dL (ref 0.40–1.20)
Glucose, Bld: 328 mg/dL — ABNORMAL HIGH (ref 70–99)
Potassium: 3.7 mEq/L (ref 3.5–5.3)
Sodium: 139 mEq/L (ref 135–145)
Total Bilirubin: 0.7 mg/dL (ref 0.3–1.2)
Total Protein: 7.5 g/dL (ref 6.0–8.3)

## 2006-07-31 LAB — CBC WITH DIFFERENTIAL/PLATELET
BASO%: 0.6 % (ref 0.0–2.0)
Basophils Absolute: 0 10*3/uL (ref 0.0–0.1)
EOS%: 1.4 % (ref 0.0–7.0)
Eosinophils Absolute: 0.1 10*3/uL (ref 0.0–0.5)
HCT: 40 % (ref 34.8–46.6)
HGB: 13.6 g/dL (ref 11.6–15.9)
LYMPH%: 25.2 % (ref 14.0–48.0)
MCH: 29.2 pg (ref 26.0–34.0)
MCHC: 34.1 g/dL (ref 32.0–36.0)
MCV: 85.6 fL (ref 81.0–101.0)
MONO#: 0.5 10*3/uL (ref 0.1–0.9)
MONO%: 6.9 % (ref 0.0–13.0)
NEUT#: 5.1 10*3/uL (ref 1.5–6.5)
NEUT%: 65.9 % (ref 39.6–76.8)
Platelets: 233 10*3/uL (ref 145–400)
RBC: 4.67 10*6/uL (ref 3.70–5.32)
RDW: 11.5 % (ref 11.3–14.5)
WBC: 7.7 10*3/uL (ref 3.9–10.0)
lymph#: 1.9 10*3/uL (ref 0.9–3.3)

## 2006-08-15 ENCOUNTER — Ambulatory Visit: Payer: Self-pay | Admitting: Family Medicine

## 2006-08-15 LAB — CONVERTED CEMR LAB
HCT: 39.2 %
Hemoglobin: 13.5 g/dL
MCV: 86.5 fL
Platelets: 240 10*3/uL
RBC: 4.53 M/uL
WBC: 9 10*3/uL

## 2006-09-01 ENCOUNTER — Ambulatory Visit: Payer: Self-pay | Admitting: Family Medicine

## 2006-09-01 LAB — CONVERTED CEMR LAB: Hgb A1c MFr Bld: 8.4 %

## 2006-09-05 ENCOUNTER — Ambulatory Visit: Payer: Self-pay | Admitting: Family Medicine

## 2006-09-06 ENCOUNTER — Ambulatory Visit: Payer: Self-pay | Admitting: Family Medicine

## 2006-11-02 ENCOUNTER — Ambulatory Visit: Payer: Self-pay | Admitting: Internal Medicine

## 2006-11-16 ENCOUNTER — Ambulatory Visit: Payer: Self-pay | Admitting: Hematology and Oncology

## 2006-11-21 LAB — CBC WITH DIFFERENTIAL/PLATELET
BASO%: 0.4 % (ref 0.0–2.0)
Basophils Absolute: 0 10*3/uL (ref 0.0–0.1)
EOS%: 1.2 % (ref 0.0–7.0)
Eosinophils Absolute: 0.1 10*3/uL (ref 0.0–0.5)
HCT: 35.5 % (ref 34.8–46.6)
HGB: 12.4 g/dL (ref 11.6–15.9)
LYMPH%: 25.6 % (ref 14.0–48.0)
MCH: 29.4 pg (ref 26.0–34.0)
MCHC: 34.9 g/dL (ref 32.0–36.0)
MCV: 84.3 fL (ref 81.0–101.0)
MONO#: 0.5 10*3/uL (ref 0.1–0.9)
MONO%: 6.1 % (ref 0.0–13.0)
NEUT#: 5.9 10*3/uL (ref 1.5–6.5)
NEUT%: 66.7 % (ref 39.6–76.8)
Platelets: 245 10*3/uL (ref 145–400)
RBC: 4.22 10*6/uL (ref 3.70–5.32)
RDW: 12.8 % (ref 11.3–14.5)
WBC: 8.8 10*3/uL (ref 3.9–10.0)
lymph#: 2.3 10*3/uL (ref 0.9–3.3)

## 2006-11-22 LAB — COMPREHENSIVE METABOLIC PANEL
ALT: 10 U/L (ref 0–35)
AST: 11 U/L (ref 0–37)
Albumin: 4.1 g/dL (ref 3.5–5.2)
Alkaline Phosphatase: 88 U/L (ref 39–117)
BUN: 17 mg/dL (ref 6–23)
CO2: 23 mEq/L (ref 19–32)
Calcium: 9.7 mg/dL (ref 8.4–10.5)
Chloride: 104 mEq/L (ref 96–112)
Creatinine, Ser: 0.88 mg/dL (ref 0.40–1.20)
Glucose, Bld: 293 mg/dL — ABNORMAL HIGH (ref 70–99)
Potassium: 3.7 mEq/L (ref 3.5–5.3)
Sodium: 140 mEq/L (ref 135–145)
Total Bilirubin: 0.6 mg/dL (ref 0.3–1.2)
Total Protein: 6.9 g/dL (ref 6.0–8.3)

## 2006-11-22 LAB — CEA: CEA: 0.9 ng/mL (ref 0.0–5.0)

## 2006-11-29 ENCOUNTER — Ambulatory Visit (HOSPITAL_COMMUNITY): Admission: RE | Admit: 2006-11-29 | Discharge: 2006-11-29 | Payer: Self-pay | Admitting: Internal Medicine

## 2006-11-29 ENCOUNTER — Encounter (INDEPENDENT_AMBULATORY_CARE_PROVIDER_SITE_OTHER): Payer: Self-pay | Admitting: Family Medicine

## 2006-12-13 ENCOUNTER — Ambulatory Visit: Payer: Self-pay | Admitting: Internal Medicine

## 2006-12-26 ENCOUNTER — Inpatient Hospital Stay (HOSPITAL_COMMUNITY): Admission: EM | Admit: 2006-12-26 | Discharge: 2006-12-28 | Payer: Self-pay | Admitting: Emergency Medicine

## 2007-01-04 ENCOUNTER — Ambulatory Visit (HOSPITAL_COMMUNITY): Admission: RE | Admit: 2007-01-04 | Discharge: 2007-01-04 | Payer: Self-pay | Admitting: Neurosurgery

## 2007-01-21 ENCOUNTER — Encounter: Admission: RE | Admit: 2007-01-21 | Discharge: 2007-01-21 | Payer: Self-pay | Admitting: Neurosurgery

## 2007-01-31 ENCOUNTER — Telehealth (INDEPENDENT_AMBULATORY_CARE_PROVIDER_SITE_OTHER): Payer: Self-pay | Admitting: Family Medicine

## 2007-02-07 ENCOUNTER — Encounter (INDEPENDENT_AMBULATORY_CARE_PROVIDER_SITE_OTHER): Payer: Self-pay | Admitting: Family Medicine

## 2007-02-07 ENCOUNTER — Ambulatory Visit: Payer: Self-pay | Admitting: Internal Medicine

## 2007-02-07 DIAGNOSIS — D320 Benign neoplasm of cerebral meninges: Secondary | ICD-10-CM

## 2007-02-07 HISTORY — DX: Benign neoplasm of cerebral meninges: D32.0

## 2007-02-07 LAB — CONVERTED CEMR LAB
BUN: 15 mg/dL (ref 6–23)
CO2: 24 meq/L (ref 19–32)
Calcium: 9 mg/dL (ref 8.4–10.5)
Chloride: 102 meq/L (ref 96–112)
Creatinine, Ser: 0.85 mg/dL (ref 0.40–1.20)
Glucose, Bld: 301 mg/dL — ABNORMAL HIGH (ref 70–99)
Potassium: 3.8 meq/L (ref 3.5–5.3)
Sodium: 138 meq/L (ref 135–145)

## 2007-02-08 ENCOUNTER — Encounter (INDEPENDENT_AMBULATORY_CARE_PROVIDER_SITE_OTHER): Payer: Self-pay | Admitting: Family Medicine

## 2007-02-12 ENCOUNTER — Telehealth (INDEPENDENT_AMBULATORY_CARE_PROVIDER_SITE_OTHER): Payer: Self-pay | Admitting: Family Medicine

## 2007-03-16 ENCOUNTER — Ambulatory Visit (HOSPITAL_COMMUNITY): Admission: RE | Admit: 2007-03-16 | Discharge: 2007-03-16 | Payer: Self-pay | Admitting: Family Medicine

## 2007-03-16 ENCOUNTER — Ambulatory Visit: Payer: Self-pay | Admitting: Family Medicine

## 2007-03-16 ENCOUNTER — Encounter (INDEPENDENT_AMBULATORY_CARE_PROVIDER_SITE_OTHER): Payer: Self-pay | Admitting: Family Medicine

## 2007-03-16 DIAGNOSIS — E1165 Type 2 diabetes mellitus with hyperglycemia: Secondary | ICD-10-CM | POA: Insufficient documentation

## 2007-03-16 DIAGNOSIS — E1122 Type 2 diabetes mellitus with diabetic chronic kidney disease: Secondary | ICD-10-CM | POA: Insufficient documentation

## 2007-03-16 LAB — CONVERTED CEMR LAB
BUN: 18 mg/dL (ref 6–23)
CO2: 23 meq/L (ref 19–32)
Calcium: 9.9 mg/dL (ref 8.4–10.5)
Chloride: 104 meq/L (ref 96–112)
Creatinine, Ser: 0.75 mg/dL (ref 0.40–1.20)
Glucose, Bld: 254 mg/dL — ABNORMAL HIGH (ref 70–99)
Potassium: 3.7 meq/L (ref 3.5–5.3)
Pro B Natriuretic peptide (BNP): 26 pg/mL (ref 0.0–100.0)
Sodium: 141 meq/L (ref 135–145)

## 2007-03-19 ENCOUNTER — Ambulatory Visit: Payer: Self-pay | Admitting: Family Medicine

## 2007-03-19 ENCOUNTER — Encounter (INDEPENDENT_AMBULATORY_CARE_PROVIDER_SITE_OTHER): Payer: Self-pay | Admitting: Family Medicine

## 2007-03-20 ENCOUNTER — Encounter (INDEPENDENT_AMBULATORY_CARE_PROVIDER_SITE_OTHER): Payer: Self-pay | Admitting: Family Medicine

## 2007-03-20 ENCOUNTER — Ambulatory Visit: Payer: Self-pay | Admitting: Family Medicine

## 2007-03-26 ENCOUNTER — Ambulatory Visit: Payer: Self-pay | Admitting: Hematology and Oncology

## 2007-03-30 ENCOUNTER — Ambulatory Visit (HOSPITAL_COMMUNITY): Admission: RE | Admit: 2007-03-30 | Discharge: 2007-03-30 | Payer: Self-pay | Admitting: Sports Medicine

## 2007-03-30 ENCOUNTER — Encounter (INDEPENDENT_AMBULATORY_CARE_PROVIDER_SITE_OTHER): Payer: Self-pay | Admitting: Family Medicine

## 2007-03-30 LAB — CBC WITH DIFFERENTIAL/PLATELET
BASO%: 0.1 % (ref 0.0–2.0)
Basophils Absolute: 0 10*3/uL (ref 0.0–0.1)
EOS%: 1.8 % (ref 0.0–7.0)
Eosinophils Absolute: 0.1 10*3/uL (ref 0.0–0.5)
HCT: 35.2 % (ref 34.8–46.6)
HGB: 12.1 g/dL (ref 11.6–15.9)
LYMPH%: 29.7 % (ref 14.0–48.0)
MCH: 29.4 pg (ref 26.0–34.0)
MCHC: 34.3 g/dL (ref 32.0–36.0)
MCV: 85.5 fL (ref 81.0–101.0)
MONO#: 0.5 10*3/uL (ref 0.1–0.9)
MONO%: 6.2 % (ref 0.0–13.0)
NEUT#: 4.6 10*3/uL (ref 1.5–6.5)
NEUT%: 62.2 % (ref 39.6–76.8)
Platelets: 273 10*3/uL (ref 145–400)
RBC: 4.12 10*6/uL (ref 3.70–5.32)
RDW: 13.6 % (ref 11.3–14.5)
WBC: 7.4 10*3/uL (ref 3.9–10.0)
lymph#: 2.2 10*3/uL (ref 0.9–3.3)

## 2007-03-30 LAB — COMPREHENSIVE METABOLIC PANEL
ALT: 8 U/L (ref 0–35)
AST: 12 U/L (ref 0–37)
Albumin: 4.1 g/dL (ref 3.5–5.2)
Alkaline Phosphatase: 78 U/L (ref 39–117)
BUN: 13 mg/dL (ref 6–23)
CO2: 26 mEq/L (ref 19–32)
Calcium: 9.5 mg/dL (ref 8.4–10.5)
Chloride: 107 mEq/L (ref 96–112)
Creatinine, Ser: 0.8 mg/dL (ref 0.40–1.20)
Glucose, Bld: 165 mg/dL — ABNORMAL HIGH (ref 70–99)
Potassium: 3.9 mEq/L (ref 3.5–5.3)
Sodium: 143 mEq/L (ref 135–145)
Total Bilirubin: 0.6 mg/dL (ref 0.3–1.2)
Total Protein: 7.1 g/dL (ref 6.0–8.3)

## 2007-03-30 LAB — LACTATE DEHYDROGENASE: LDH: 141 U/L (ref 94–250)

## 2007-03-30 LAB — CEA: CEA: 0.8 ng/mL (ref 0.0–5.0)

## 2007-04-03 ENCOUNTER — Encounter: Payer: Self-pay | Admitting: Sports Medicine

## 2007-04-03 ENCOUNTER — Encounter (INDEPENDENT_AMBULATORY_CARE_PROVIDER_SITE_OTHER): Payer: Self-pay | Admitting: Family Medicine

## 2007-04-03 ENCOUNTER — Ambulatory Visit: Payer: Self-pay | Admitting: Cardiology

## 2007-04-03 ENCOUNTER — Ambulatory Visit: Payer: Self-pay | Admitting: Sports Medicine

## 2007-04-03 ENCOUNTER — Inpatient Hospital Stay (HOSPITAL_COMMUNITY): Admission: EM | Admit: 2007-04-03 | Discharge: 2007-04-04 | Payer: Self-pay | Admitting: Emergency Medicine

## 2007-04-03 ENCOUNTER — Encounter: Payer: Self-pay | Admitting: Family Medicine

## 2007-04-03 ENCOUNTER — Ambulatory Visit: Payer: Self-pay | Admitting: Family Medicine

## 2007-04-03 ENCOUNTER — Ambulatory Visit: Payer: Self-pay | Admitting: Internal Medicine

## 2007-04-06 ENCOUNTER — Telehealth (INDEPENDENT_AMBULATORY_CARE_PROVIDER_SITE_OTHER): Payer: Self-pay | Admitting: Family Medicine

## 2007-04-18 ENCOUNTER — Encounter (INDEPENDENT_AMBULATORY_CARE_PROVIDER_SITE_OTHER): Payer: Self-pay | Admitting: Family Medicine

## 2007-04-20 ENCOUNTER — Encounter: Payer: Self-pay | Admitting: *Deleted

## 2007-07-09 ENCOUNTER — Encounter (INDEPENDENT_AMBULATORY_CARE_PROVIDER_SITE_OTHER): Payer: Self-pay | Admitting: Family Medicine

## 2007-07-09 ENCOUNTER — Ambulatory Visit: Payer: Self-pay | Admitting: Family Medicine

## 2007-07-09 ENCOUNTER — Telehealth: Payer: Self-pay | Admitting: *Deleted

## 2007-07-09 DIAGNOSIS — G5760 Lesion of plantar nerve, unspecified lower limb: Secondary | ICD-10-CM | POA: Insufficient documentation

## 2007-07-09 LAB — CONVERTED CEMR LAB
BUN: 13 mg/dL (ref 6–23)
CO2: 21 meq/L (ref 19–32)
Calcium: 9.1 mg/dL (ref 8.4–10.5)
Chloride: 107 meq/L (ref 96–112)
Creatinine, Ser: 0.82 mg/dL (ref 0.40–1.20)
Glucose, Bld: 197 mg/dL — ABNORMAL HIGH (ref 70–99)
Hgb A1c MFr Bld: 8.6 %
Potassium: 3.9 meq/L (ref 3.5–5.3)
Sodium: 140 meq/L (ref 135–145)

## 2007-07-10 ENCOUNTER — Encounter (INDEPENDENT_AMBULATORY_CARE_PROVIDER_SITE_OTHER): Payer: Self-pay | Admitting: Family Medicine

## 2007-08-21 ENCOUNTER — Ambulatory Visit (HOSPITAL_COMMUNITY): Admission: RE | Admit: 2007-08-21 | Discharge: 2007-08-21 | Payer: Self-pay | Admitting: Chiropractic Medicine

## 2007-08-21 ENCOUNTER — Telehealth: Payer: Self-pay | Admitting: *Deleted

## 2007-08-21 ENCOUNTER — Emergency Department (HOSPITAL_COMMUNITY): Admission: EM | Admit: 2007-08-21 | Discharge: 2007-08-21 | Payer: Self-pay | Admitting: Emergency Medicine

## 2007-08-23 ENCOUNTER — Encounter (INDEPENDENT_AMBULATORY_CARE_PROVIDER_SITE_OTHER): Payer: Self-pay | Admitting: Family Medicine

## 2007-08-23 ENCOUNTER — Ambulatory Visit: Payer: Self-pay | Admitting: Family Medicine

## 2007-08-23 ENCOUNTER — Telehealth: Payer: Self-pay | Admitting: *Deleted

## 2007-08-23 ENCOUNTER — Ambulatory Visit (HOSPITAL_COMMUNITY): Admission: RE | Admit: 2007-08-23 | Discharge: 2007-08-23 | Payer: Self-pay | Admitting: Family Medicine

## 2007-08-23 LAB — CONVERTED CEMR LAB
BUN: 15 mg/dL (ref 6–23)
CO2: 23 meq/L (ref 19–32)
Calcium: 9.8 mg/dL (ref 8.4–10.5)
Chloride: 104 meq/L (ref 96–112)
Creatinine, Ser: 0.77 mg/dL (ref 0.40–1.20)
Glucose, Bld: 192 mg/dL — ABNORMAL HIGH (ref 70–99)
HCT: 38.3 % (ref 36.0–46.0)
Hemoglobin: 12.7 g/dL (ref 12.0–15.0)
MCHC: 33.2 g/dL (ref 30.0–36.0)
MCV: 86.6 fL (ref 78.0–100.0)
Platelets: 154 10*3/uL (ref 150–400)
Potassium: 3.9 meq/L (ref 3.5–5.3)
RBC: 4.43 M/uL (ref 3.87–5.11)
RDW: 13.5 % (ref 11.5–15.5)
Sodium: 139 meq/L (ref 135–145)
WBC: 8.3 10*3/uL (ref 4.0–10.5)

## 2007-08-24 ENCOUNTER — Encounter (INDEPENDENT_AMBULATORY_CARE_PROVIDER_SITE_OTHER): Payer: Self-pay | Admitting: Family Medicine

## 2007-08-24 ENCOUNTER — Ambulatory Visit: Payer: Self-pay | Admitting: Family Medicine

## 2007-08-29 ENCOUNTER — Ambulatory Visit: Payer: Self-pay | Admitting: Family Medicine

## 2007-09-05 ENCOUNTER — Encounter: Payer: Self-pay | Admitting: *Deleted

## 2007-09-19 ENCOUNTER — Ambulatory Visit: Payer: Self-pay | Admitting: Hematology and Oncology

## 2007-09-24 ENCOUNTER — Ambulatory Visit (HOSPITAL_COMMUNITY): Admission: RE | Admit: 2007-09-24 | Discharge: 2007-09-24 | Payer: Self-pay | Admitting: Hematology and Oncology

## 2007-09-24 LAB — CBC WITH DIFFERENTIAL/PLATELET
BASO%: 1.1 % (ref 0.0–2.0)
Basophils Absolute: 0.1 10*3/uL (ref 0.0–0.1)
EOS%: 1 % (ref 0.0–7.0)
Eosinophils Absolute: 0.1 10*3/uL (ref 0.0–0.5)
HCT: 38.8 % (ref 34.8–46.6)
HGB: 13.4 g/dL (ref 11.6–15.9)
LYMPH%: 27.5 % (ref 14.0–48.0)
MCH: 29.3 pg (ref 26.0–34.0)
MCHC: 34.4 g/dL (ref 32.0–36.0)
MCV: 85.2 fL (ref 81.0–101.0)
MONO#: 0.7 10*3/uL (ref 0.1–0.9)
MONO%: 7.2 % (ref 0.0–13.0)
NEUT#: 5.8 10*3/uL (ref 1.5–6.5)
NEUT%: 63.2 % (ref 39.6–76.8)
Platelets: 248 10*3/uL (ref 145–400)
RBC: 4.56 10*6/uL (ref 3.70–5.32)
RDW: 13.3 % (ref 11.3–14.5)
WBC: 9.2 10*3/uL (ref 3.9–10.0)
lymph#: 2.5 10*3/uL (ref 0.9–3.3)

## 2007-09-24 LAB — COMPREHENSIVE METABOLIC PANEL
ALT: 15 U/L (ref 0–35)
AST: 18 U/L (ref 0–37)
Albumin: 3.7 g/dL (ref 3.5–5.2)
Alkaline Phosphatase: 74 U/L (ref 39–117)
BUN: 14 mg/dL (ref 6–23)
CO2: 31 mEq/L (ref 19–32)
Calcium: 9.4 mg/dL (ref 8.4–10.5)
Chloride: 101 mEq/L (ref 96–112)
Creatinine, Ser: 0.95 mg/dL (ref 0.40–1.20)
Glucose, Bld: 155 mg/dL — ABNORMAL HIGH (ref 70–99)
Potassium: 3.3 mEq/L — ABNORMAL LOW (ref 3.5–5.3)
Sodium: 141 mEq/L (ref 135–145)
Total Bilirubin: 1.2 mg/dL (ref 0.3–1.2)
Total Protein: 7.3 g/dL (ref 6.0–8.3)

## 2007-09-24 LAB — CEA: CEA: 1.1 ng/mL (ref 0.0–5.0)

## 2007-09-27 ENCOUNTER — Encounter (INDEPENDENT_AMBULATORY_CARE_PROVIDER_SITE_OTHER): Payer: Self-pay | Admitting: Family Medicine

## 2007-11-09 ENCOUNTER — Emergency Department (HOSPITAL_COMMUNITY): Admission: EM | Admit: 2007-11-09 | Discharge: 2007-11-09 | Payer: Self-pay | Admitting: Emergency Medicine

## 2007-12-18 ENCOUNTER — Ambulatory Visit: Payer: Self-pay | Admitting: Family Medicine

## 2007-12-18 LAB — CONVERTED CEMR LAB: Hgb A1c MFr Bld: 7.8 %

## 2007-12-25 ENCOUNTER — Ambulatory Visit: Payer: Self-pay | Admitting: Family Medicine

## 2007-12-26 ENCOUNTER — Ambulatory Visit: Payer: Self-pay | Admitting: Family Medicine

## 2007-12-26 ENCOUNTER — Encounter: Payer: Self-pay | Admitting: Family Medicine

## 2007-12-28 ENCOUNTER — Encounter: Payer: Self-pay | Admitting: Family Medicine

## 2007-12-28 LAB — CONVERTED CEMR LAB
Cholesterol: 141 mg/dL (ref 0–200)
HDL: 44 mg/dL (ref >39)
LDL Cholesterol: 70 mg/dL (ref 0–99)
Total CHOL/HDL Ratio: 3.2
Triglycerides: 135 mg/dL (ref <150)
VLDL: 27 mg/dL (ref 0–40)

## 2008-01-16 ENCOUNTER — Encounter: Admission: RE | Admit: 2008-01-16 | Discharge: 2008-01-16 | Payer: Self-pay | Admitting: Family Medicine

## 2008-02-11 ENCOUNTER — Encounter: Admission: RE | Admit: 2008-02-11 | Discharge: 2008-02-11 | Payer: Self-pay | Admitting: Neurosurgery

## 2008-02-22 ENCOUNTER — Telehealth: Payer: Self-pay | Admitting: *Deleted

## 2008-03-13 ENCOUNTER — Encounter: Payer: Self-pay | Admitting: Family Medicine

## 2008-03-13 ENCOUNTER — Ambulatory Visit (HOSPITAL_COMMUNITY): Admission: RE | Admit: 2008-03-13 | Discharge: 2008-03-13 | Payer: Self-pay | Admitting: Family Medicine

## 2008-03-13 ENCOUNTER — Ambulatory Visit: Payer: Self-pay | Admitting: Family Medicine

## 2008-03-13 LAB — CONVERTED CEMR LAB
BUN: 17 mg/dL (ref 6–23)
CO2: 22 meq/L (ref 19–32)
Calcium: 9.8 mg/dL (ref 8.4–10.5)
Chloride: 107 meq/L (ref 96–112)
Creatinine, Ser: 0.86 mg/dL (ref 0.40–1.20)
Glucose, Bld: 118 mg/dL — ABNORMAL HIGH (ref 70–99)
Hgb A1c MFr Bld: 7.5 %
Potassium: 3.5 meq/L (ref 3.5–5.3)
Sodium: 143 meq/L (ref 135–145)
TSH: 0.694 microintl units/mL (ref 0.350–4.50)

## 2008-03-17 ENCOUNTER — Encounter: Payer: Self-pay | Admitting: Family Medicine

## 2008-03-19 ENCOUNTER — Ambulatory Visit: Payer: Self-pay | Admitting: Cardiology

## 2008-03-19 ENCOUNTER — Encounter: Payer: Self-pay | Admitting: Family Medicine

## 2008-03-25 ENCOUNTER — Ambulatory Visit: Payer: Self-pay

## 2008-03-25 ENCOUNTER — Encounter: Payer: Self-pay | Admitting: Family Medicine

## 2008-04-21 ENCOUNTER — Telehealth: Payer: Self-pay | Admitting: Family Medicine

## 2008-04-30 ENCOUNTER — Emergency Department (HOSPITAL_COMMUNITY): Admission: EM | Admit: 2008-04-30 | Discharge: 2008-05-01 | Payer: Self-pay | Admitting: Emergency Medicine

## 2008-05-01 ENCOUNTER — Telehealth: Payer: Self-pay | Admitting: Family Medicine

## 2008-05-05 ENCOUNTER — Encounter (INDEPENDENT_AMBULATORY_CARE_PROVIDER_SITE_OTHER): Payer: Self-pay | Admitting: Family Medicine

## 2008-05-05 ENCOUNTER — Ambulatory Visit: Payer: Self-pay | Admitting: Family Medicine

## 2008-05-05 ENCOUNTER — Ambulatory Visit (HOSPITAL_COMMUNITY): Admission: RE | Admit: 2008-05-05 | Discharge: 2008-05-05 | Payer: Self-pay | Admitting: Family Medicine

## 2008-05-05 LAB — CONVERTED CEMR LAB
ALT: 8 units/L (ref 0–35)
AST: 12 units/L (ref 0–37)
Albumin: 4.2 g/dL (ref 3.5–5.2)
Alkaline Phosphatase: 76 units/L (ref 39–117)
BUN: 13 mg/dL (ref 6–23)
Basophils Absolute: 0 10*3/uL (ref 0.0–0.1)
Basophils Relative: 0 % (ref 0–1)
Bilirubin Urine: NEGATIVE
Blood in Urine, dipstick: NEGATIVE
CO2: 23 meq/L (ref 19–32)
Calcium: 9.3 mg/dL (ref 8.4–10.5)
Chloride: 102 meq/L (ref 96–112)
Creatinine, Ser: 0.8 mg/dL (ref 0.40–1.20)
Eosinophils Absolute: 0.1 10*3/uL (ref 0.0–0.7)
Eosinophils Relative: 2 % (ref 0–5)
Glucose, Bld: 191 mg/dL — ABNORMAL HIGH (ref 70–99)
Glucose, Urine, Semiquant: 100
HCT: 40.1 % (ref 36.0–46.0)
Hemoglobin: 13.1 g/dL (ref 12.0–15.0)
Ketones, urine, test strip: NEGATIVE
Lymphocytes Relative: 36 % (ref 12–46)
Lymphs Abs: 2.9 10*3/uL (ref 0.7–4.0)
MCHC: 32.7 g/dL (ref 30.0–36.0)
MCV: 86.6 fL (ref 78.0–100.0)
Monocytes Absolute: 0.6 10*3/uL (ref 0.1–1.0)
Monocytes Relative: 8 % (ref 3–12)
Neutro Abs: 4.5 10*3/uL (ref 1.7–7.7)
Neutrophils Relative %: 55 % (ref 43–77)
Nitrite: NEGATIVE
Platelets: 281 10*3/uL (ref 150–400)
Potassium: 3.9 meq/L (ref 3.5–5.3)
Protein, U semiquant: 30
RBC: 4.63 M/uL (ref 3.87–5.11)
RDW: 13.2 % (ref 11.5–15.5)
Sodium: 143 meq/L (ref 135–145)
Specific Gravity, Urine: >=1.03
Total Bilirubin: 0.6 mg/dL (ref 0.3–1.2)
Total Protein: 7 g/dL (ref 6.0–8.3)
Urobilinogen, UA: 0.2
WBC Urine, dipstick: NEGATIVE
WBC: 8.1 10*3/uL (ref 4.0–10.5)
pH: 5.5

## 2008-05-06 ENCOUNTER — Encounter (INDEPENDENT_AMBULATORY_CARE_PROVIDER_SITE_OTHER): Payer: Self-pay | Admitting: Family Medicine

## 2008-05-09 ENCOUNTER — Encounter: Payer: Self-pay | Admitting: Family Medicine

## 2008-07-08 ENCOUNTER — Telehealth: Payer: Self-pay | Admitting: Family Medicine

## 2008-07-09 ENCOUNTER — Ambulatory Visit: Payer: Self-pay | Admitting: Family Medicine

## 2008-07-09 LAB — CONVERTED CEMR LAB: Hgb A1c MFr Bld: 7.9 %

## 2008-07-10 ENCOUNTER — Encounter (INDEPENDENT_AMBULATORY_CARE_PROVIDER_SITE_OTHER): Payer: Self-pay | Admitting: Family Medicine

## 2008-08-12 ENCOUNTER — Emergency Department (HOSPITAL_COMMUNITY): Admission: EM | Admit: 2008-08-12 | Discharge: 2008-08-13 | Payer: Self-pay | Admitting: Emergency Medicine

## 2008-08-13 ENCOUNTER — Ambulatory Visit (HOSPITAL_COMMUNITY): Admission: RE | Admit: 2008-08-13 | Discharge: 2008-08-13 | Payer: Self-pay | Admitting: Emergency Medicine

## 2008-08-13 ENCOUNTER — Encounter (INDEPENDENT_AMBULATORY_CARE_PROVIDER_SITE_OTHER): Payer: Self-pay | Admitting: Emergency Medicine

## 2008-08-13 ENCOUNTER — Ambulatory Visit: Payer: Self-pay | Admitting: Vascular Surgery

## 2008-09-03 ENCOUNTER — Encounter: Payer: Self-pay | Admitting: Family Medicine

## 2008-09-03 ENCOUNTER — Ambulatory Visit: Payer: Self-pay | Admitting: Family Medicine

## 2008-09-03 LAB — CONVERTED CEMR LAB
ALT: 12 units/L (ref 0–35)
AST: 13 units/L (ref 0–37)
Albumin: 4.2 g/dL (ref 3.5–5.2)
Alkaline Phosphatase: 89 units/L (ref 39–117)
BUN: 12 mg/dL (ref 6–23)
CO2: 22 meq/L (ref 19–32)
Calcium: 9.4 mg/dL (ref 8.4–10.5)
Chloride: 105 meq/L (ref 96–112)
Creatinine, Ser: 0.75 mg/dL (ref 0.40–1.20)
Glucose, Bld: 112 mg/dL — ABNORMAL HIGH (ref 70–99)
Potassium: 3.6 meq/L (ref 3.5–5.3)
Sodium: 144 meq/L (ref 135–145)
Total Bilirubin: 0.7 mg/dL (ref 0.3–1.2)
Total Protein: 7.4 g/dL (ref 6.0–8.3)

## 2008-09-09 ENCOUNTER — Encounter: Payer: Self-pay | Admitting: Family Medicine

## 2008-09-16 ENCOUNTER — Encounter: Payer: Self-pay | Admitting: Family Medicine

## 2008-10-30 ENCOUNTER — Encounter: Payer: Self-pay | Admitting: Family Medicine

## 2008-10-30 ENCOUNTER — Ambulatory Visit: Payer: Self-pay | Admitting: Family Medicine

## 2008-10-30 LAB — CONVERTED CEMR LAB
BUN: 16 mg/dL (ref 6–23)
CO2: 25 meq/L (ref 19–32)
Calcium: 9.3 mg/dL (ref 8.4–10.5)
Chloride: 105 meq/L (ref 96–112)
Cholesterol: 149 mg/dL (ref 0–200)
Creatinine, Ser: 0.87 mg/dL (ref 0.40–1.20)
Glucose, Bld: 145 mg/dL — ABNORMAL HIGH (ref 70–99)
HDL: 46 mg/dL (ref >39)
Hgb A1c MFr Bld: 7.8 %
LDL Cholesterol: 77 mg/dL (ref 0–99)
Potassium: 3.6 meq/L (ref 3.5–5.3)
Sodium: 143 meq/L (ref 135–145)
Total CHOL/HDL Ratio: 3.2
Triglycerides: 130 mg/dL (ref <150)
VLDL: 26 mg/dL (ref 0–40)

## 2008-10-31 ENCOUNTER — Encounter: Payer: Self-pay | Admitting: Family Medicine

## 2008-11-05 ENCOUNTER — Telehealth: Payer: Self-pay | Admitting: *Deleted

## 2008-11-06 ENCOUNTER — Ambulatory Visit: Payer: Self-pay | Admitting: Family Medicine

## 2008-11-06 ENCOUNTER — Encounter: Payer: Self-pay | Admitting: Family Medicine

## 2008-11-07 ENCOUNTER — Ambulatory Visit: Payer: Self-pay | Admitting: Family Medicine

## 2008-11-11 ENCOUNTER — Encounter: Payer: Self-pay | Admitting: Family Medicine

## 2008-12-27 ENCOUNTER — Encounter (INDEPENDENT_AMBULATORY_CARE_PROVIDER_SITE_OTHER): Payer: Self-pay | Admitting: *Deleted

## 2008-12-27 DIAGNOSIS — F17200 Nicotine dependence, unspecified, uncomplicated: Secondary | ICD-10-CM | POA: Insufficient documentation

## 2009-03-29 ENCOUNTER — Emergency Department (HOSPITAL_COMMUNITY): Admission: EM | Admit: 2009-03-29 | Discharge: 2009-03-29 | Payer: Self-pay | Admitting: Emergency Medicine

## 2009-04-09 ENCOUNTER — Ambulatory Visit (HOSPITAL_COMMUNITY): Admission: RE | Admit: 2009-04-09 | Discharge: 2009-04-09 | Payer: Self-pay | Admitting: Neurosurgery

## 2009-06-09 ENCOUNTER — Inpatient Hospital Stay (HOSPITAL_COMMUNITY): Admission: EM | Admit: 2009-06-09 | Discharge: 2009-06-11 | Payer: Self-pay | Admitting: Emergency Medicine

## 2009-06-09 ENCOUNTER — Ambulatory Visit: Payer: Self-pay | Admitting: Family Medicine

## 2009-06-10 LAB — CONVERTED CEMR LAB
Platelets: 11.2 10*3/uL
TSH: 1.392 microintl units/mL

## 2009-06-16 ENCOUNTER — Ambulatory Visit: Payer: Self-pay | Admitting: Family Medicine

## 2009-06-16 DIAGNOSIS — R42 Dizziness and giddiness: Secondary | ICD-10-CM | POA: Insufficient documentation

## 2009-07-03 ENCOUNTER — Encounter: Payer: Self-pay | Admitting: Family Medicine

## 2009-08-05 ENCOUNTER — Ambulatory Visit: Payer: Self-pay | Admitting: Family Medicine

## 2009-08-07 ENCOUNTER — Telehealth: Payer: Self-pay | Admitting: Family Medicine

## 2009-08-11 ENCOUNTER — Telehealth: Payer: Self-pay | Admitting: Family Medicine

## 2009-08-13 ENCOUNTER — Telehealth: Payer: Self-pay | Admitting: Family Medicine

## 2009-09-03 ENCOUNTER — Encounter: Payer: Self-pay | Admitting: Family Medicine

## 2009-09-03 ENCOUNTER — Ambulatory Visit: Payer: Self-pay | Admitting: Family Medicine

## 2009-09-03 LAB — CONVERTED CEMR LAB
ALT: 11 units/L (ref 0–35)
AST: 11 units/L (ref 0–37)
Albumin: 4.1 g/dL (ref 3.5–5.2)
Alkaline Phosphatase: 75 units/L (ref 39–117)
BUN: 14 mg/dL (ref 6–23)
CO2: 22 meq/L (ref 19–32)
Calcium: 9.5 mg/dL (ref 8.4–10.5)
Chloride: 106 meq/L (ref 96–112)
Creatinine, Ser: 0.71 mg/dL (ref 0.40–1.20)
Glucose, Bld: 153 mg/dL — ABNORMAL HIGH (ref 70–99)
HCT: 37.9 % (ref 36.0–46.0)
Hemoglobin: 12.3 g/dL (ref 12.0–15.0)
MCHC: 32.5 g/dL (ref 30.0–36.0)
MCV: 86.7 fL (ref 78.0–100.0)
Platelets: 232 10*3/uL (ref 150–400)
Potassium: 3.7 meq/L (ref 3.5–5.3)
RBC: 4.37 M/uL (ref 3.87–5.11)
RDW: 13.4 % (ref 11.5–15.5)
Sodium: 142 meq/L (ref 135–145)
Total Bilirubin: 0.6 mg/dL (ref 0.3–1.2)
Total Protein: 6.7 g/dL (ref 6.0–8.3)
WBC: 8.8 10*3/uL (ref 4.0–10.5)

## 2009-09-21 ENCOUNTER — Encounter: Payer: Self-pay | Admitting: Family Medicine

## 2010-01-29 ENCOUNTER — Ambulatory Visit: Payer: Self-pay | Admitting: Family Medicine

## 2010-01-29 ENCOUNTER — Emergency Department (HOSPITAL_COMMUNITY): Admission: EM | Admit: 2010-01-29 | Discharge: 2010-01-30 | Payer: Self-pay | Admitting: Emergency Medicine

## 2010-01-29 ENCOUNTER — Telehealth: Payer: Self-pay | Admitting: *Deleted

## 2010-02-09 ENCOUNTER — Ambulatory Visit: Payer: Self-pay | Admitting: Family Medicine

## 2010-02-09 LAB — CONVERTED CEMR LAB: Hgb A1c MFr Bld: 8.9 %

## 2010-02-16 ENCOUNTER — Encounter: Payer: Self-pay | Admitting: Family Medicine

## 2010-03-01 ENCOUNTER — Encounter: Payer: Self-pay | Admitting: Family Medicine

## 2010-03-01 ENCOUNTER — Ambulatory Visit: Payer: Self-pay | Admitting: Family Medicine

## 2010-03-01 LAB — CONVERTED CEMR LAB
ALT: <8 units/L (ref 0–35)
AST: 14 units/L (ref 0–37)
Albumin: 4.1 g/dL (ref 3.5–5.2)
Alkaline Phosphatase: 79 units/L (ref 39–117)
Amylase: 64 units/L (ref 0–105)
BUN: 45 mg/dL — ABNORMAL HIGH (ref 6–23)
Bilirubin Urine: NEGATIVE
CO2: 27 meq/L (ref 19–32)
Calcium: 9.4 mg/dL (ref 8.4–10.5)
Chloride: 101 meq/L (ref 96–112)
Creatinine, Ser: 1.59 mg/dL — ABNORMAL HIGH (ref 0.40–1.20)
Glucose, Bld: 169 mg/dL — ABNORMAL HIGH (ref 70–99)
Glucose, Urine, Semiquant: NEGATIVE
Ketones, urine, test strip: NEGATIVE
Lipase: 53 units/L (ref 0–75)
Nitrite: POSITIVE
Potassium: 3.4 meq/L — ABNORMAL LOW (ref 3.5–5.3)
Protein, U semiquant: NEGATIVE
Sodium: 142 meq/L (ref 135–145)
Specific Gravity, Urine: 1.02
Total Bilirubin: 0.6 mg/dL (ref 0.3–1.2)
Total Protein: 7.1 g/dL (ref 6.0–8.3)
Urobilinogen, UA: 0.2
WBC Urine, dipstick: NEGATIVE
pH: 5.5

## 2010-03-03 ENCOUNTER — Ambulatory Visit (HOSPITAL_COMMUNITY)
Admission: RE | Admit: 2010-03-03 | Discharge: 2010-03-03 | Payer: Self-pay | Source: Home / Self Care | Admitting: Family Medicine

## 2010-03-08 ENCOUNTER — Encounter: Payer: Self-pay | Admitting: Family Medicine

## 2010-03-08 ENCOUNTER — Ambulatory Visit: Payer: Self-pay | Admitting: Family Medicine

## 2010-03-08 DIAGNOSIS — R279 Unspecified lack of coordination: Secondary | ICD-10-CM | POA: Insufficient documentation

## 2010-03-08 LAB — CONVERTED CEMR LAB
HCT: 36.6 % (ref 36.0–46.0)
Hemoglobin: 11.9 g/dL — ABNORMAL LOW (ref 12.0–15.0)
MCHC: 32.5 g/dL (ref 30.0–36.0)
MCV: 87.8 fL (ref 78.0–100.0)
Platelets: 246 10*3/uL (ref 150–400)
RBC: 4.17 M/uL (ref 3.87–5.11)
RDW: 12.6 % (ref 11.5–15.5)
Vitamin B-12: 343 pg/mL (ref 211–911)
WBC: 9.2 10*3/uL (ref 4.0–10.5)

## 2010-03-10 ENCOUNTER — Ambulatory Visit (HOSPITAL_COMMUNITY)
Admission: RE | Admit: 2010-03-10 | Discharge: 2010-03-10 | Payer: Self-pay | Source: Home / Self Care | Admitting: Family Medicine

## 2010-03-18 ENCOUNTER — Ambulatory Visit: Payer: Self-pay | Admitting: Family Medicine

## 2010-04-07 ENCOUNTER — Ambulatory Visit: Payer: Self-pay | Admitting: Hematology and Oncology

## 2010-04-09 LAB — CBC WITH DIFFERENTIAL/PLATELET
BASO%: 0.3 % (ref 0.0–2.0)
Basophils Absolute: 0 10*3/uL (ref 0.0–0.1)
EOS%: 1 % (ref 0.0–7.0)
Eosinophils Absolute: 0.1 10*3/uL (ref 0.0–0.5)
HCT: 32.5 % — ABNORMAL LOW (ref 34.8–46.6)
HGB: 11.3 g/dL — ABNORMAL LOW (ref 11.6–15.9)
LYMPH%: 25.5 % (ref 14.0–49.7)
MCH: 30.2 pg (ref 25.1–34.0)
MCHC: 34.6 g/dL (ref 31.5–36.0)
MCV: 87.4 fL (ref 79.5–101.0)
MONO#: 0.4 10*3/uL (ref 0.1–0.9)
MONO%: 5.6 % (ref 0.0–14.0)
NEUT#: 5.3 10*3/uL (ref 1.5–6.5)
NEUT%: 67.6 % (ref 38.4–76.8)
Platelets: 227 10*3/uL (ref 145–400)
RBC: 3.72 10*6/uL (ref 3.70–5.45)
RDW: 13.2 % (ref 11.2–14.5)
WBC: 7.9 10*3/uL (ref 3.9–10.3)
lymph#: 2 10*3/uL (ref 0.9–3.3)

## 2010-04-09 LAB — COMPREHENSIVE METABOLIC PANEL
ALT: 9 U/L (ref 0–35)
AST: 16 U/L (ref 0–37)
Albumin: 3.6 g/dL (ref 3.5–5.2)
Alkaline Phosphatase: 64 U/L (ref 39–117)
BUN: 19 mg/dL (ref 6–23)
CO2: 25 mEq/L (ref 19–32)
Calcium: 9 mg/dL (ref 8.4–10.5)
Chloride: 101 mEq/L (ref 96–112)
Creatinine, Ser: 1.02 mg/dL (ref 0.40–1.20)
Glucose, Bld: 290 mg/dL — ABNORMAL HIGH (ref 70–99)
Potassium: 3.4 mEq/L — ABNORMAL LOW (ref 3.5–5.3)
Sodium: 139 mEq/L (ref 135–145)
Total Bilirubin: 0.5 mg/dL (ref 0.3–1.2)
Total Protein: 6.8 g/dL (ref 6.0–8.3)

## 2010-04-09 LAB — CEA: CEA: 0.8 ng/mL (ref 0.0–5.0)

## 2010-04-14 ENCOUNTER — Ambulatory Visit: Payer: Self-pay | Admitting: Hematology and Oncology

## 2010-04-14 ENCOUNTER — Encounter: Payer: Self-pay | Admitting: Family Medicine

## 2010-05-03 ENCOUNTER — Encounter: Payer: Self-pay | Admitting: Neurosurgery

## 2010-05-05 ENCOUNTER — Encounter
Admission: RE | Admit: 2010-05-05 | Discharge: 2010-05-05 | Payer: Self-pay | Source: Home / Self Care | Attending: Hematology and Oncology | Admitting: Hematology and Oncology

## 2010-05-11 NOTE — Progress Notes (Signed)
Summary: triage   Phone Note Call from Patient Call back at Home Phone [phone]   Caller: Patient Summary of Call: Crystal from [facility] called and is concerned about [name]- bs is running high for the last few days (430) and had abd. pain over the weekend.  she is going to be home around 4 pm today Initial call taken by: Audie Clear,  July 08, 2008 2:30 PM  Follow-up for Phone Call        left message Follow-up by: Elige Radon RN,  July 08, 2008 2:35 PM  Additional Follow-up for Phone Call Additional follow up Details #1::        pt agreed to 1:30pm appt tomorrow with Dr. Jerline Pain. asked her to bring the meter & pill bottles. states she has not been feeling well. abd pain off & on Additional Follow-up by: Elige Radon RN,  July 08, 2008 3:49 PM

## 2010-05-11 NOTE — Assessment & Plan Note (Signed)
Summary: holter monitor removal  Nurse Visit   Complete Medication List: 1)  Ativan 1 Mg Tabs (Lorazepam) .... Take 1 tablet by mouth every twelve hours 2)  Bayer Childrens Aspirin 81 Mg Chew (Aspirin) .... Take 1 tablet by mouth once a day 3)  Coreg 25 Mg Tabs (Carvedilol) .... Take 1 tablet by mouth twice a day 4)  Glucotrol 10 Mg Tabs (Glipizide) .... Take 1 tablet by mouth twice a day 5)  Hydrochlorothiazide 12.5 Mg Tabs (Hydrochlorothiazide) .... Take 1 tablet by mouth once a day 6)  Lantus 100 Unit/ml Soln (Insulin glargine) .... Inject 22 units in the morning and 22 units in the evening  dispense # qs 3 months.  refills #3 7)  Norvasc 10 Mg Tabs (Amlodipine besylate) .... Take 1 tablet once a day 8)  Onetouch Ultra Test Strp (Glucose blood) .... Use 1 strip three times a day 9)  Lisinopril 20 Mg Tabs (Lisinopril) .Marland Kitchen.. 1 by mouth daily 10)  Nitroquick 0.4 Mg Subl (Nitroglycerin) .Marland Kitchen.. 1 under your tongue every 5 minutes as needed.  if chest pain not better, call 911 11)  Isosorbide Dinitrate 30 Mg Tabs (Isosorbide dinitrate) .Marland Kitchen.. 1 by mouth 4x per day   Vitals Entered By: Lupita Raider CMA, (March 27, 2007 9:36 AM)                 Prior Medications: ATIVAN 1 MG TABS (LORAZEPAM) Take 1 tablet by mouth every twelve hours BAYER CHILDRENS ASPIRIN 81 MG CHEW (ASPIRIN) Take 1 tablet by mouth once a day COREG 25 MG TABS (CARVEDILOL) Take 1 tablet by mouth twice a day GLUCOTROL 10 MG TABS (GLIPIZIDE) Take 1 tablet by mouth twice a day HYDROCHLOROTHIAZIDE 12.5 MG TABS (HYDROCHLOROTHIAZIDE) Take 1 tablet by mouth once a day LANTUS 100 UNIT/ML SOLN (INSULIN GLARGINE) Inject 22 units in the morning and 22 units in the evening  Dispense # QS 3 months.  Refills #3 NORVASC 10 MG TABS (AMLODIPINE BESYLATE) Take 1 tablet once a day ONETOUCH ULTRA TEST  STRP (GLUCOSE BLOOD) Use 1 strip three times a day LISINOPRIL 20 MG  TABS (LISINOPRIL) 1 by mouth daily NITROQUICK 0.4 MG  SUBL  (NITROGLYCERIN) 1 under your tongue every 5 minutes as needed.  if chest pain not better, call 911 ISOSORBIDE DINITRATE 30 MG  TABS (ISOSORBIDE DINITRATE) 1 by mouth 4x per day     Orders Added: 1)  No Charge Patient Arrived (NCPA0) [NCPA0]    ]  Appended Document: holter monitor removal Note ETT today 03/30/2007  Test termination secondary to Chest pain, poor exercise tolerance (only 4.5 METS) and frequent PVCs in exercise and at rest.  All suggestive of coronary artery disease or significant intrinsic CVD.

## 2010-05-11 NOTE — Miscellaneous (Signed)
Summary: chart update   Clinical Lists Changes  Problems: Removed problem of UTI (ICD-599.0) Removed problem of FREQUENCY, URINARY (ICD-788.41) Removed problem of ABDOMINAL PAIN (ICD-789.00) Removed problem of GASTRITIS (ICD-535.50) Removed problem of CHEST PAIN (ICD-786.50) Removed problem of MENOPAUSAL SYNDROME (ICD-627.2) Removed problem of HYPERTRIGLYCERIDEMIA (ICD-272.1) Removed problem of HYPERTENSION, BENIGN SYSTEMIC (ICD-401.1) Observations: Added new observation of PRIMARY MD: Eugenie Norrie  MD (09/21/2009 11:36)

## 2010-05-11 NOTE — Progress Notes (Signed)
Summary: phn msg   Phone Note Other Incoming Call back at [phone]   Caller: [place] Summary of Call: Pt is feeling whoozing on new bp meds, but bp is down. Initial call taken by: Raymond Gurney,  Aug 11, 2009 10:55 AM  Follow-up for Phone Call        noted. Blue team -- can we call pt to see if she's falling? If so, needs to stop med and have follow-up here soon. Thanks. Follow-up by: Eugenie Norrie  MD,  Aug 11, 2009 1:43 PM  Additional Follow-up for Phone Call Additional follow up Details #1::        Spoke with pt., she states meds make her feel out of it and that she always catches herself before she falls. Offered pt an earlier appt., states she is going to try taking the med at night to see if that makes any difference and if not she will call back and make a sooner appt. Message to MD. Additional Follow-up by: Levert Feinstein LPN,  May  3, 624THL 579FGE PM    Additional Follow-up for Phone Call Additional follow up Details #2::    agree. thanks. Follow-up by: Eugenie Norrie  MD,  Aug 11, 2009 2:08 PM

## 2010-05-11 NOTE — Letter (Signed)
Summary: Generic Letter  [facility]  [address]   [place], [place] [address]   Phone: [phone]  Fax: [phone]    03/19/2007 MRN: [id number]  [place], [place]  [address]  Dear Ms. Lizama,  Your electroyltes were normal except your blood sugar was high at 254.  The blood test for strain on your heart was also normal.    Sincerely,      Hortencia Pilar MD [place]  Appended Document: Generic Letter Lab results sent via mail to pt ..................Marland KitchenDelores Pate-Gaddy, CMA (AAMA) {DATETIMESTAMP()}

## 2010-05-11 NOTE — Consult Note (Signed)
Summary: Heart Monitor  Heart Monitor   Imported By: Raymond Gurney 10/31/2008 12:24:59  _____________________________________________________________________  External Attachment:    Type:   Image     Comment:   External Document

## 2010-05-11 NOTE — Letter (Signed)
Summary: Generic Letter  [facility]  [address]   [place], [place] [address]   Phone: [phone]  Fax: [phone]    02/08/2007 MRN: [id number]  [place], [place]  [address]  Dear Ms. Kohan,  Your blood work was normal except for a blood sugar that was 300.      Sincerely,      Hortencia Pilar MD [place]

## 2010-05-11 NOTE — Letter (Signed)
Summary: Generic Letter  [facility]  [address]   [place], [place] [address]   Phone: [phone]  Fax: [phone]    07/10/2007 MRN: [id number]  [place], [place]  [address]  Dear Ms. Mase,  Your electrolytes were normal.    Sodium                    140 mEq/L                   135-145   Potassium                 3.9 mEq/L                   3.5-5.3   Chloride                  107 mEq/L                   96-112   CO2                       21 mEq/L                    19-32   Glucose              [H]  197 mg/dL                   70-99   BUN                       13 mg/dL                    6-23   Creatinine                0.82 mg/dL                  0.40-1.20   Calcium                   9.1 mg/dL                   8.4-10.5   Sincerely,      Hortencia Pilar MD [place]

## 2010-05-11 NOTE — Progress Notes (Signed)
Summary: Triage   Phone Note Call from Patient Call back at Home Phone [phone]   Summary of Call: was seen in ED last night and told she has a UTI, needs to f/u on Monday with any provider.   Initial call taken by: Drucie Ip,  May 01, 2008 11:39 AM  Follow-up for Phone Call        taking antibiotic. was told to f/u monday. pcp not in clinic that day. appt with another md on his team at 11am. asked pt to bring the med bottles with her Follow-up by: Elige Radon RN,  May 01, 2008 11:46 AM

## 2010-05-11 NOTE — Assessment & Plan Note (Signed)
Summary: f/u,df/resch'd from 7/14/[facility]   Vital Signs:  Patient profile:   65 year old female Height:      64 inches Weight:      158.9 pounds BMI:     27.37 Temp:     97.9 degrees F oral Pulse rate:   68 / minute BP sitting:   155 / 85  (left arm) Cuff size:   regular  Vitals Entered By: Levert Feinstein LPN (July 22, 624THL D34-534 AM) CC: f/u visit   Primary Care Provider:  Eugenie Norrie  MD  CC:  f/u visit.  History of Present Illness: 1. HTN Out of coreg for the last month due to issues with the MAP program. Taking accuretic and amlodipine. Brings log of BPs at home -- looks pretty well-controlled [id number] to Q000111Q systolic for the most part. No problems with medications.  ROS: endorses HA, occasional SOB, occasional chest pain.  2. diabetes Brings in meter today. Sugars are variable from 100s to low 300s. Hasn't been to diabetic education center for a while and has questions re: diet (what she should be eating). Eats lots of grits, but also admits to high variability in dietary intake-- says sometimes she doesn't eat for a whole day. Last LDL was 70. Checks feet every day. A1C today 7.8. 7.9 at last check.   ROS: reports occasional nausea and loose stools.   3. Falls States that she's still falling. Reports dizziness with room spinning. Thinks that she may lose consciousness sometimes. BPs not very low per her log. States that she hasn't injured herself before with any of these falls. States the she sometimes loses her footing as well. Of note, pt does not mention these falls when asked about her concerns, has to be asked about this history before she gives any details.    Allergies: No Known Drug Allergies  Past History:  Past Surgical History: -bladder tack -,  -cardiac Cath 03/31/04 no sig. 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  There was a 50% PDA)  -Cardiolite 11/04-nl, EF 68%, no ischemia -normal myoview 12/10 -colonoscopy: adenomatous polyp 2003 -EGD 7/98, 7/03 (H.pylori) gastritis - -hysterectomy -,  -Nissen fundiplication (Q000111Q) -Holter monitor 5/08 - normal.   Physical Exam  General:  Vital signs reviewed. Hypertensive. No signs of bruising or trauma on the skin/head.  HEENT: normal. EOMI. Fundoscopic exam benign. Neck: no carotid bruits; no JVD CV: RRR, no murmurs Lungs: work of breathing, no wheezes, rales, or ronchi  Ext: no cyanosis, clubbing, or edema;  Neuro: CN II-XII intact; speech, station and gait normal; ambulates easily. Normal finger-nose. Romberg negative. Could not elicit patellar DTRs. alert and oriented. Appriopriately answers questions.   Diabetes Management Exam:    Foot Exam (with socks and/or shoes not present):       Sensory-Monofilament:          Left foot: normal          Right foot: normal       Inspection:          Left foot: normal          Right foot: normal       Nails:          Left foot: normal          Right foot: normal   Impression & Recommendations:  Problem #  1:  HYPERTENSION, BENIGN SYSTEMIC (ICD-401.1) Suboptimal control. On amlodipine. Tried to call MAP office but they were gone for lunch. Will try to get coreg back on-board. No very low BPs in pt's log. Fundoscopic exam benign. Check electrolytes.   Her updated medication list for this problem includes:    Accuretic 20-12.5 Mg Tabs (Quinapril-hydrochlorothiazide) .Marland Kitchen... Take 2 tabs by mouth daily    Carvedilol 25 Mg Tabs (Carvedilol) ..... One by mouth two times a day for blood pressure and heart    Amlodipine Besylate 10 Mg Tabs (Amlodipine besylate) ..... One by mouth daily for blood pressure  Orders: Basic Met-[facility] [id number]) [facility]- Est  Level 4 [id number])  Problem # 2:  DIABETES MELLITUS II, UNCOMPLICATED (XX123456) A1C shows need for better control. Continue current glucotrol and add metformin two times a day. Still  don't think pt need insulin reinstated yet. Continue to follow. Monofilament normal. Checks feet daily. Will refer to diabetes and nutrition for review of dietary interventions.  Her updated medication list for this problem includes:    Bayer Childrens Aspirin 81 Mg Chew (Aspirin) .Marland Kitchen... Take 1 tablet by mouth once a day    Glucotrol 10 Mg Tabs (Glipizide) .Marland Kitchen... Take 1 tablet by mouth twice a day    Accuretic 20-12.5 Mg Tabs (Quinapril-hydrochlorothiazide) .Marland Kitchen... Take 2 tabs by mouth daily    Metformin Hcl 500 Mg Tabs (Metformin hcl) ..... One by mouth two times a day for diabetes  Orders: A1C-[facility] [id number]) Diabetic Clinic Referral (Diabetic) Lipid-[facility] [id number]) [facility]- Est  Level 4 [id number])  Problem # 3:  ACCIDENTAL FALL (ICD-E888.9) Assessment: Unchanged I am a bit puzzled by the patient's history. Normal holter in 2008. Has had negative myoview 12/09. Consider orthostatic hypotension, medication side effect, peripheral neuropathy, BPPV, arrhythmia. Will redo Holter and have back soon for reevaluation. Encouraged pt to follow-up with cardiology again soon. It is remarkable that the patients shows no sign of injury, has a normal exam, and doesn't seem concerned about these falls - i.e. does not volunteer to discuss the problem when asked about active issues.  Precepted with Dr. Nori Riis.   Orders: [facility]- Est  Level 4 ([id number])Future Orders: 24 Hr Holter (24 Hr Holter) ... 10/30/2009  Complete Medication List: 1)  Bayer Childrens Aspirin 81 Mg Chew (Aspirin) .... Take 1 tablet by mouth once a day 2)  Glucotrol 10 Mg Tabs (Glipizide) .... Take 1 tablet by mouth twice a day 3)  Truetrack Test Strp (Glucose blood) .... Dispense 50 strips; use as directed 4)  Nitroquick 0.4 Mg Subl (Nitroglycerin) .Marland Kitchen.. 1 under your tongue every 5 minutes as needed.  if chest pain not better, call 911 5)  Accuretic 20-12.5 Mg Tabs (Quinapril-hydrochlorothiazide) .... Take 2 tabs by mouth daily 6)  Carvedilol 25 Mg Tabs  (Carvedilol) .... One by mouth two times a day for blood pressure and heart 7)  Hydroxyzine Hcl 25 Mg Tabs (Hydroxyzine hcl) .... One by mouth q6 hours as needed itching. 8)  Amlodipine Besylate 10 Mg Tabs (Amlodipine besylate) .... One by mouth daily for blood pressure 9)  Metformin Hcl 500 Mg Tabs (Metformin hcl) .... One by mouth two times a day for diabetes  Patient Instructions: 1)  follow-up on Monday for a nurse visit to place a heart monitor. 2)  we're going to [facility] you [facility] with the [facility]. 3)  Start metformin 500 mg two times a day. Let me know if you have problems. 4)  follow-up with me in 1-2 months. Prescriptions:  METFORMIN HCL 500 MG TABS (METFORMIN HCL) one by mouth two times a day for diabetes  #60 x 3   Entered and Authorized by:   Eugenie Norrie  MD   Signed by:   Eugenie Norrie  MD on 10/30/2008   Method used:   Electronically to        [facility]. [phone]* (retail)       [address] [place]  [address]       Ph: [phone] or [phone]       Fax: [phone]   RxID:   [id number] METFORMIN HCL 500 MG TABS (METFORMIN HCL) one by mouth two times a day for diabetes  #60 x 3   Entered and Authorized by:   Eugenie Norrie  MD   Signed by:   Eugenie Norrie  MD on 10/30/2008   Method used:   Electronically to        [facility]  [phone]* (retail)       [place], [place]  [address]       Ph: [phone] or [phone]       Fax: [phone]   RxID:   [id number]   Laboratory Results   Blood Tests   Date/Time Received: October 30, 2008 11:30 AM  Date/Time Reported: October 30, 2008 11:47 AM   HGBA1C: 7.8%   (Normal Range: Non-Diabetic - 3-6%   Control Diabetic - 6-8%)  Comments: ...............test performed by......Marland KitchenBonnie A. Martin[place], [place] (ASCP)

## 2010-05-11 NOTE — Assessment & Plan Note (Signed)
Summary: routine check wp   Vital Signs:  Patient Profile:   65 Years Old Female Height:     64 inches (162.56 cm) Weight:      163.5 pounds BMI:     28.17 Temp:     98.5 degrees F oral Pulse rate:   68 / minute BP sitting:   165 / 95  (left arm) Cuff size:   regular  Pt. in pain?   yes    Location:   [facility]  Vitals Entered By: Levert Feinstein LPN (December  3, 579FGE 2:13 PM)                  PCP:  Eugenie Norrie  MD  Chief Complaint:  f/u visit.  History of Present Illness: 1. chest pain Pt reports chest heaviness. Radiates into left arm with hand numbness/tingling. Worse with exertion and relieved by rest. Happens almost daily and lasts about 30 minutes. Cath 12/08 showed moderate non-obstructive disease and normal LV function (reviewed chart). Normal renal arteries. Has seen [facility] in the past. Occasional associated nausea. Walked 2 miles the other day without problems but felt "shaky" afterwards and was wiped out for the rest of the day.   2. HTN Occasionally sees "in threes." Has fairly frequent headaches. BP diary shows pressures mostly in the 120s to 150s. Taking medications without problems. Needs refill on coreg (we did not have this on her med list at this point). No problems walking/talking  3. diabetes Sugars in the 140s to low 200s for the most part. No hypoglycemic episodes. Taking Lantus two times a day and glipizide without problems. Needs diabetic eye exam. Cholesterol good 9/09 (LDL 70, HDL 44).   4. Itching Last month or so has had itchy skin. Staying in shower for 15 minutes. Uses moisturizers. Keeps her up at night. Skin feels dry.        Physical Exam  General:     alert appropriate. Well-dressed, well-nourished.  Eyes:     No corneal or conjunctival inflammation noted. EOMI. Perrla. Catarcts visualized. No papilledema. Optic disks normal to my exam. No hemorrhage/exudate. Mouth:     pharynx pink and moist.   Neck:     no JVD. R carotid bruit  noted. Lungs:     Normal respiratory effort, chest expands symmetrically. Lungs are clear to auscultation, no crackles or wheezes. Heart:     normal rate, regular rhythm, no murmur, and no gallop.   Abdomen:     soft, non-tender, no distention, no masses, no guarding, and no hepatomegaly.   Pulses:     2+ DP and radial pulses. Extremities:     no LE edema Neurologic:     CN II-XII grossly intact. Normal gait. Speech articulate. Additional Exam:     EKG: NSR. unchanged from 5/09. Non-specific t wave inversion in II, III, aVF that is old.    Impression & Recommendations:  Problem # 1:  CHEST PAIN (ICD-786.50) Assessment: Deteriorated Sounds typical. NTG not really helping. EKG unchaned from 5/09 - T inversion in leads II, III, aVF. On coreg (which I did not know) and ACE/thiazide combo. Given HR will increase HCTZ instead of beta-blocker. BP 165/95 on my manual recheck. Will refer back to [facility] as they did a cath on her 12/09 which showed moderate non-obstructive disease. Perhaps they would want to consider myoview - regardless, cardiology follow-up would be helpful in this pt with multiple risk factors. Check BMET, TSH today. Orders: Cardiology Referral (Cardiology) Basic Met-[facility] (  [phone]) TSH-[facility] [phone])  Orders: Basic Met-[facility] [id number]) TSH-[facility] [phone]) Cardiology Referral (Cardiology) [facility]- Est  Level 4 [id number])   Problem # 2:  HYPERTENSION, BENIGN SYSTEMIC (ICD-401.1) Assessment: Deteriorated Increase HCTZ to 25 mg as above. Return in 1-2 months. Check BMET.  Her updated medication list for this problem includes:    Lisinopril-hydrochlorothiazide 20-25 Mg Tabs (Lisinopril-hydrochlorothiazide) ..... One by mouth daily    Carvedilol 25 Mg Tabs (Carvedilol) ..... One by mouth two times a day  Orders: [facility]- Est  Level 4 [id number])   Problem # 3:  DIABETES MELLITUS II, UNCOMPLICATED (XX123456) A1C at 7.5. Given consistenly high sugars will increase Lantus  to 32 units two times a day. Consider metformin as insulin sensitizing agent. Refer for diabetic eye exam. Cholesterol at goal 9/09.   Her updated medication list for this problem includes:    Bayer Childrens Aspirin 81 Mg Chew (Aspirin) .Marland Kitchen... Take 1 tablet by mouth once a day    Glucotrol 10 Mg Tabs (Glipizide) .Marland Kitchen... Take 1 tablet by mouth twice a day    Lantus 100 Unit/ml Soln (Insulin glargine) ..... Inject 32 units sq bid. dispense one month's supply    Lisinopril-hydrochlorothiazide 20-25 Mg Tabs (Lisinopril-hydrochlorothiazide) ..... One by mouth daily  Orders: A1C-[facility] [id number]) Ophthalmology Referral (Ophthalmology) [facility]- Est  Level 4 [id number])   Problem # 4:  DRY SKIN (ICD-701.1) Assessment: New likely due to moisture loss, heat, winter dry air. Advised moisturizers, reducing shower time to 10 minutes or less. Hydroxyzine as needed.  Orders: [facility]- Est  Level 4 ([address])   Complete Medication List: 1)  Ativan 1 Mg Tabs (Lorazepam) .... Take 1 tablet by mouth every twelve hours 2)  Bayer Childrens Aspirin 81 Mg Chew (Aspirin) .... Take 1 tablet by mouth once a day 3)  Glucotrol 10 Mg Tabs (Glipizide) .... Take 1 tablet by mouth twice a day 4)  Lantus 100 Unit/ml Soln (Insulin glargine) .... Inject 32 units sq bid. dispense one month's supply 5)  Truetrack Test Strp (Glucose blood) .... Dispense 50 strips; use as directed 6)  Nitroquick 0.4 Mg Subl (Nitroglycerin) .Marland Kitchen.. 1 under your tongue every 5 minutes as needed.  if chest pain not better, call 911 7)  Lisinopril-hydrochlorothiazide 20-25 Mg Tabs (Lisinopril-hydrochlorothiazide) .... One by mouth daily 8)  Carvedilol 25 Mg Tabs (Carvedilol) .... One by mouth two times a day 9)  Hydroxyzine Hcl 25 Mg Tabs (Hydroxyzine hcl) .... One by mouth q6 hours as needed itching.   Patient Instructions: 1)  Increase Lantus to 32 units twice a day. Continue to record your blood sugars. If it ever gets below 70 I want you to call our office and let  us know. 2)  We are going to refer you back to [facility] to see if they want to do any more tests. 3)  We are going to refer you to the eye doctor for your diabetes. 4)  We are going to increase your linsinopril/HCTZ. 5)  Follow-up here in 1-2 months. We need to get on top of your diabetes and blood pressure.   Prescriptions: HYDROXYZINE HCL 25 MG TABS (HYDROXYZINE HCL) one by mouth q6 hours as needed itching.  #30 x 2   Entered and Authorized by:   Eugenie Norrie  MD   Signed by:   Eugenie Norrie  MD on 03/13/2008   Method used:   Electronically to        [facility]. [phone]* (retail)  [address], [place]  [address]       Ph: [phone] or [phone]       Fax: [phone]   RxID:   [id number] TEST  STRP (GLUCOSE BLOOD) dispense 50 strips; use as directed  #1 x 300   Entered and Authorized by:   Eugenie Norrie  MD   Signed by:   Eugenie Norrie  MD on 03/13/2008   Method used:   Electronically to        [facility]. [phone]* (retail)       [address] [place], [place]  [address]       Ph: [phone] or [phone]       Fax: [phone]   RxID:   [id number] LISINOPRIL-HYDROCHLOROTHIAZIDE 20-25 MG TABS (LISINOPRIL-HYDROCHLOROTHIAZIDE) one by mouth daily  #30 x 3   Entered and Authorized by:   Eugenie Norrie  MD   Signed by:   Eugenie Norrie  MD on 03/13/2008   Method used:   Electronically to        [facility]. [phone]* (retail)       [address] [place]  [address]       Ph: [phone] or [phone]       Fax: [phone]   RxID:   [id number] CARVEDILOL 25 MG TABS (CARVEDILOL) one by mouth two times a day  #30 x 3   Entered and Authorized by:   Eugenie Norrie  MD   Signed by:   Eugenie Norrie  MD on 03/13/2008   Method used:   Electronically to        [facility]. [phone]* (retail)       [address] [place], [place]  [address]       Ph: [phone]  or [phone]       Fax: [phone]   RxID:   [id number] LANTUS 100 UNIT/ML SOLN (INSULIN GLARGINE) Inject 32 units SQ bid. Dispense one month's supply  #1 x 3   Entered and Authorized by:   Eugenie Norrie  MD   Signed by:   Eugenie Norrie  MD on 03/13/2008   Method used:   Electronically to        [facility]. [phone]* (retail)       [address] [place], [place]  [address]       Ph: [phone] or [phone]       Fax: [phone]   RxID:   [id number]  ] Laboratory Results   Blood Tests   Date/Time Received: March 13, 2008 2:16  PM  Date/Time Reported: March 13, 2008 2:29 PM   HGBA1C: 7.5%   (Normal Range: Non-Diabetic - 3-6%   Control Diabetic - 6-8%)  Comments: ...............test performed by......Marland KitchenBonnie A. Martin[place], [place] (ASCP)

## 2010-05-11 NOTE — Miscellaneous (Signed)
Summary: DNKA  Clinical Lists Changes 

## 2010-05-11 NOTE — Assessment & Plan Note (Signed)
Summary: f/u,df   Vital Signs:  Patient profile:   65 year old female Height:      64 inches Weight:      157 pounds BMI:     27.05 Temp:     98.4 degrees F oral Pulse rate:   56 / minute BP sitting:   110 / 72  (right arm) Cuff size:   regular  Vitals Entered By: Schuyler Amor CMA (March 08, 2010 9:01 AM) CC: F/U vertigo and abdominal pain Is Patient Diabetic? Yes Pain Assessment Patient in pain? yes     Location: abdomen Intensity: 7   Primary Care Provider:  Lynne Leader MD  CC:  F/U vertigo and abdominal pain.  History of Present Illness: Ms Garnes presents to clinic today to follow up her abdominal pain and vertigo/dizzyness.   Vertigo: Continues to be a problem. Has been intermintantly persistant since March when she was admited to the hospital. She has seen Dr. Juleen China and Kennon Rounds. She has tried valium which did not help much. She describes spinning. Her family notes that she seems like she is drunk. She denies any falls or syncope symptoms. She is concerned about her history of colon cancer.   Abdominal Pain: Upper left quadrent and lower right quadrent pain. Has been present for a few weeks. Denies any nausea or vomiting. No blood per rectum or mouth. Pain is not related to food. She has had an ultrasound of her abdomen which only showed fatty liver. She is of course worried about recurrance of her colon cancer.   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  (ICD-280.9) 19)  Rhinitis, Allergic Nos  (ICD-477.9) 20)  Hypertriglyceridemia  (ICD-272.1) 21)  Hypertension, Benign Essential  (ICD-401.1)  Current Medications (verified): 1)  Bayer Childrens Aspirin 81 Mg Chew (Aspirin) .... Take 1 Tablet By Mouth Once A Day 2)  Glucotrol 10 Mg Tabs (Glipizide) .... Take 1 Tablet By Mouth Twice A Day 3)  Truetrack Test  Strp (Glucose Blood) .... Dispense 50 Strips; Use As Directed 4)  Nitroquick 0.4 Mg  Subl (Nitroglycerin) .Marland Kitchen.. 1 Under Your Tongue Every 5 Minutes As Needed.  If Chest Pain Not Better, Call 911 5)  Accuretic 20-12.5 Mg Tabs (Quinapril-Hydrochlorothiazide) .... Take 2 Tabs By Mouth Daily 6)  Carvedilol 25 Mg Tabs (Carvedilol) .... One By Mouth Two Times A Day For Blood Pressure and Heart 7)  Klonopin 0.5 Mg Tabs (Clonazepam) .... One By Mouth 2-3 Times A Day For Dizziness 8)  Accuretic 20-12.5 Mg Tabs (Quinapril-Hydrochlorothiazide) .... 2 Tabs By Mouth Daily For Blood Pressure 9)  Amlodipine Besylate 10 Mg Tabs (Amlodipine Besylate) .... One By Mouth Daily 10)  Lantus 100 Unit/ml Soln (Insulin Glargine) .Marland Kitchen.. 10 Units Subcutaneously Twice A Day;; Disp 1  Months Supply 11)  Clarinex 5 Mg Tabs (Desloratadine) .... One By Mouth Daily For Allergies 12)  Antivert 50 Mg Tabs (Meclizine Hcl) .Marland Kitchen.. 1 By Mouth Two Times A Day As Needed Dizziness 13)  Valium 5 Mg Tabs (Diazepam) .... Take 1/2 To 1 Three Times A Day While Having Severe Vertigo Symptoms 14)  Promethazine Hcl 12.5 Mg Tabs (Promethazine Hcl) .Marland Kitchen.. 1 Tab By Mouth Every 8 Hours As Needed For Nausea  Allergies (verified): No Known Drug Allergies  Past History:  Past Medical History: Last updated: 12/25/2007 colon  CA Dx 12/23/04 - sees Dr. Jamse Arn. surg 02/25/05 chemo sche x 12 CTs 7/07: no mets, interdigital neuroma right foot, moderatly differntiated adenocarcinoma with, mucinous feautres., s/p 6 mo chemo. last tx 10-14-05 meningioma diagnosed 11/08 hysterectomy in 77 for bleeding TMJ surgery 90 bladder tack in 89 [id number]  Past Surgical History: Last updated: 10/30/2008 -bladder tack -,  -cardiac Cath 03/31/04 no sig. 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  There was a 50% PDA) -Cardiolite 11/04-nl, EF 68%, no ischemia -normal myoview 12/10 -colonoscopy: adenomatous polyp 2003 -EGD 7/98, 7/03 (H.pylori) gastritis - -hysterectomy -,  -Nissen fundiplication ([id number]) -Holter monitor 5/08 - normal.   Family History: Last updated: 12/18/2007 Daughter healthy., Father with DM, dialysis, HTN, no CAD.  Mom left when patient was 40 yo.  Son commited suicide. 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  No tob, etoh, drugs.; Son commited suicide in April of 2002.  His birthday is in November-pt gets sad these times of year.  Risk Factors: Smoking Status: never (03/08/2010)  Review of Systems       The patient complains of abdominal pain and difficulty walking.  The patient denies anorexia, fever, weight loss, chest pain, syncope, prolonged cough, melena, hematochezia, severe indigestion/heartburn, muscle weakness, unusual weight change, and enlarged lymph nodes.    Physical Exam  General:  VS noted.  NAD Head:  normocephalic and atraumatic.   Eyes:  No corneal or conjunctival inflammation noted. EOMI. Perrla. Funduscopic exam benign, without hemorrhages, exudates or papilledema. Vision grossly normal. Ears:  External ear exam shows no significant lesions or deformities.  Otoscopic examination reveals  clear canals, tympanic membranes are intact bilaterally without bulging, retraction, inflammation or discharge. Hearing is grossly normal bilaterally. Nose:  External nasal examination shows no deformity or inflammation. Nasal mucosa are pink and moist without lesions or exudates. Mouth:  Oral mucosa and oropharynx without lesions or exudates.   Neck:  supple no JVD noted.  Lungs:  CTABL nl WOB.   Heart:  normal rate, regular rhythm, and no murmur.   Abdomen:  Non disteded. NABS.  TTP left upper quadrent No masses no rebound no guarding noted.  Extremities:  no LE edema Neurologic:  alert & oriented X3, cranial nerves II-XII intact, sensation intact to light touch, finger-to-nose normal, toes down bilaterally on Babinski, Romberg negative, and abnormal gait.    Gait: Curves to the right but does correct self. Not stumbling or falling.  Cervical Nodes:  No lymphadenopathy noted Axillary Nodes:  No palpable lymphadenopathy Additional Exam:  Dix-hallpike: No nystagmus to either right or left. Neg test.    Impression & Recommendations:  Problem # 1:  VERTIGO, CHRONIC (ICD-780.4) Assessment New  Pt has persistant vertigo with a  history of colon cancer and a meingeoma.  Neuro exam only showes a gait deviation to the right.  Plan at this time to start with a head CT to eval mass and meingeoma. May require a MRI of head in the future. Will also check a B12 level to eval the gait and vertigo.  Will follow up in 1-2 weeks.   Her updated medication list for this problem includes:    Clarinex 5 Mg Tabs (Desloratadine) ..... One by mouth daily for allergies    Antivert 50 Mg Tabs (Meclizine hcl) .Marland Kitchen... 1 by mouth two times a day as needed dizziness    Promethazine Hcl 12.5 Mg Tabs (Promethazine hcl) .Marland Kitchen... 1 tab by mouth every 8 hours as needed for nausea  Orders: [place]- Est  Level 4 ([id number])  Problem # 2:  ABDOMINAL PAIN (ICD-789.00) Assessment: Unchanged As above this patient has a history of  colon cancer. She has persistant abdominal pain. Will eval this with a CT scan with oral conterast and a CBC. Has had a CMP recently as well as a ultrasound.  Will follow up as above. Discussed red flags.   Orders: CBC-[facility] [id number]) B12-[facility] [id number]) CT with Contrast (CT w/ contrast) [place]- Est  Level 4 [id number])  Problem # 3:  LACK OF COORDINATION (ICD-781.3) Assessment: New Diagnosis of the gait. Neuro exam seems subtle at this time. Will follow up the above test and spend more time in clinic with a dedicated neuro exam including pinprick and vibratory exam.  Orders: [place]- Est  Level 4 [id number])  Complete Medication List: 1)  Bayer Childrens Aspirin 81 Mg Chew (Aspirin) .... Take 1 tablet by mouth once a day 2)  Glucotrol 10 Mg Tabs (Glipizide) .... Take 1 tablet by mouth twice a day 3)  Truetrack Test Strp (Glucose blood) .... Dispense 50 strips; use as directed 4)  Nitroquick 0.4 Mg Subl (Nitroglycerin) .Marland Kitchen.. 1 under your tongue every 5 minutes as needed.  if chest pain not better, call 911 5)  Accuretic 20-12.5 Mg Tabs (Quinapril-hydrochlorothiazide) .... Take 2 tabs by mouth daily 6)  Carvedilol 25 Mg Tabs (Carvedilol) .... One by mouth two times a day for blood pressure and heart 7)  Klonopin 0.5 Mg Tabs (Clonazepam) .... One by mouth 2-3 times a day for dizziness 8)  Accuretic 20-12.5 Mg Tabs (Quinapril-hydrochlorothiazide) .... 2 tabs by mouth daily for blood pressure 9)  Amlodipine Besylate 10 Mg Tabs (Amlodipine besylate) .... One by mouth daily 10)  Lantus 100 Unit/ml Soln (Insulin glargine) .Marland Kitchen.. 10 units subcutaneously twice a day;; disp 1 months supply 11)  Clarinex 5 Mg Tabs (Desloratadine) .... One by mouth daily for allergies 12)  Antivert 50 Mg Tabs (Meclizine hcl) .Marland Kitchen.. 1 by mouth two times a day as needed dizziness 13)  Valium 5 Mg Tabs (Diazepam) .... Take 1/2 to 1 three times a day while having severe vertigo symptoms 14)  Promethazine Hcl 12.5 Mg Tabs (Promethazine hcl)  .Marland Kitchen.. 1 tab by mouth every 8 hours as needed for nausea  Patient Instructions: 1)  Thank you for seeing me today. 2)  If you have chest pain, difficulty breathing, fevers over 102 that does not get better with tylenol please call us or see a doctor.  3)  Please schedule a follow-up appointment in 1-2 weeks.  4)  Please go get the CT scan.   Orders Added: 1)  CBC-[facility] [id number] 2)  B12-[facility] [[id number]] 3)  CT with Contrast [CT w/ contrast] 4)  [place]- Est  Level 4 [id number]

## 2010-05-11 NOTE — Assessment & Plan Note (Signed)
Summary: high cbgs lately, abd pain off & on   Vital Signs:  Patient profile:   65 year old female Height:      64 inches (162.56 cm) Weight:      161.5 pounds (73.41 kg) BMI:     27.82 Temp:     97.8 degrees F (36.56 degrees C) oral Pulse rate:   88 / minute BP sitting:   193 / 73  (left arm) Cuff size:   regular  Vitals Entered By: Levert Feinstein LPN (March 31, 624THL 075-GRM PM) CC: blood sugar, abd pain Pain Assessment Patient in pain? yes     Location: back Intensity: 8   History of Present Illness: 65 yo here for abdominal pain and sugars have been high.  She says she has not been following up as suggested because of her insurance running out.   DM - Sugars have been 438 at the highest. Lowest has been 111. Usually checks sugars at 6 am. Urinating a lot. Thirsty alot. Feels hungry a lot. Taking lantus 30 units. Did not increase her lantus to 32 units two times a day as she had been told to do at her last apt.CBG today is 314.  HTN - Has a little substernal chest pain right now but this is an everyday thing. Says she went to see cardiologist recently for the same pain and he told her not to worry about it. No SOB. No vision changes. No changes in leg swelling.   Abd pain - comes as a sharp pain and lasts 10-15 min. Feels like something is turning over in her stomach. Feeling nausea a lot. No vomiting but is spitting up some times. Spits up goldish yellow stuff. Sometimes has metal taste in her mouth. no heartburn. Does not eat spicey foods. Pain in belly happens when she eats and when she does not eat. Pain is in RLQ. Last time had pain was Sunday. Having a lot of diarrhea. Had colon cancer surgery in 2006 and ever since has had diarrhea. Stool is dark black. Says she did stool cards but says she turned them back in. Has had hysterectomy in the past for polyps. No cancer. Has been taking Ibuprofen 400 mg two times a day for headaches. Thinks she may have another UTI. Was seen by urology  recently and put on [place] chronically but has been unable to afford this. Is trying to get meds at [facility]. No hematuria. No dysuria.   Habits & Providers     Tobacco Status: current  Allergies: No Known Drug Allergies  Social History:    Smoking Status:  current  Physical Exam  General:  alert, NAD  Lungs:  normal respiratory effort, normal breath sounds, no crackles, and no wheezes.   Heart:  normal rate, regular rhythm, mild 2/6 RUSB systolic murmur, no gallop, and no rub.   Abdomen:  soft, non-tender, normal bowel sounds, no distention, no masses, no guarding, no rigidity, no hepatomegaly, and no splenomegaly.   Extremities:  trace edema Bilaterally    Impression & Recommendations:  Problem # 1:  DIABETES MELLITUS II, UNCOMPLICATED ([id number]) Assessment Deteriorated Advised patient to increase lantus to 32 units two times a day as we had discussed doing at her last appointment. Should follow up with Dr. Sarita Haver in 1-2 weeks.   Her updated medication list for this problem includes:    Bayer Childrens Aspirin 81 Mg Chew (Aspirin) .Marland Kitchen... Take 1 tablet by mouth once a day    Glucotrol  10 Mg Tabs (Glipizide) .Marland Kitchen... Take 1 tablet by mouth twice a day    Lantus 100 Unit/ml Soln (Insulin glargine) ..... Inject 32 units sq bid. dispense one month's supply    Prinzide 20-12.5 Mg Tabs (Lisinopril-hydrochlorothiazide) .Marland Kitchen... Take 2 tablets two times a day for blood pressure  Orders: A1C-[facility] [id number]) Glucose Cap-[facility] [id number]) [place]- Est  Level 4 [id number])  Problem # 2:  ABDOMINAL PAIN (ICD-789.00) Assessment: Deteriorated Will check urine culture. If positive will treat, if not then will refer back to urology. Also advised patient to see if she can get her macrobid filled through the [facility]. Chronic diarrhea seems to be due to her colon surgery. Will see if lab ever received her stool cards as patient said she turned them in. Patient does not seem to be feeling very poorly today on exam.  Advised her not to take Ibuprofen as this may be irritating her stomach. Does not seem like GERD but could consider trying omeprazole in the future.   Orders: Urine Culture-[facility] [id number]) [place]- Est  Level 4 [id number])  Problem # 3:  HYPERTENSION, BENIGN SYSTEMIC (ICD-401.1) Assessment: Deteriorated  BP very high. Will increase Prinzide to 2 tablets two times a day and have her come back in 1 week for BP check and to see Everhardt.  Discussed with patient the importance of being seen regularly by her PCP.   Her updated medication list for this problem includes:    Prinzide 20-12.5 Mg Tabs (Lisinopril-hydrochlorothiazide) .Marland Kitchen... Take 2 tablets two times a day for blood pressure    Carvedilol 25 Mg Tabs (Carvedilol) ..... One by mouth two times a day    Norvasc 10 Mg Tabs (Amlodipine besylate) .Marland Kitchen... Take one tablet by mouth daily for blood pressure  Orders: [place]- Est  Level 4 [id number])  Complete Medication List: 1)  Bayer Childrens Aspirin 81 Mg Chew (Aspirin) .... Take 1 tablet by mouth once a day 2)  Glucotrol 10 Mg Tabs (Glipizide) .... Take 1 tablet by mouth twice a day 3)  Lantus 100 Unit/ml Soln (Insulin glargine) .... Inject 32 units sq bid. dispense one month's supply 4)  Truetrack Test Strp (Glucose blood) .... Dispense 50 strips; use as directed 5)  Nitroquick 0.4 Mg Subl (Nitroglycerin) .Marland Kitchen.. 1 under your tongue every 5 minutes as needed.  if chest pain not better, call 911 6)  Prinzide 20-12.5 Mg Tabs (Lisinopril-hydrochlorothiazide) .... Take 2 tablets two times a day for blood pressure 7)  Carvedilol 25 Mg Tabs (Carvedilol) .... One by mouth two times a day 8)  Hydroxyzine Hcl 25 Mg Tabs (Hydroxyzine hcl) .... One by mouth q6 hours as needed itching. 9)  Norvasc 10 Mg Tabs (Amlodipine besylate) .... Take one tablet by mouth daily for blood pressure 10)  Macrobid 100 Mg Caps (Nitrofurantoin monohyd macro) .... Take one tablet two times a day for 7 days prescribed by macdiarmid,  urologist 11)  Lyrica 75 Mg Caps (Pregabalin) .... Takes one a day - she has samples at home. given to her by dr. Hoy Morn.  Patient Instructions: 1)  Increase your lantus to 32 units two times a day. 2)  Stop taking Ibuprofen and advil and try Tylenol instead.  3)  Increase your HCTZ/Lisinopril to 2 tablets two times a day. 4)  I sent your new  5)  Follow up in 1week with Dr. Sarita Haver because your blood pressure is so high today or come in 1 week for a blood pressure check with the nurses and then one week  after that to see Everhardt. Prescriptions: LANTUS 100 UNIT/ML SOLN (INSULIN GLARGINE) Inject 32 units SQ bid. Dispense one month's supply  #1 x 3   Entered and Authorized by:   Danise Mina MD   Signed by:   Danise Mina MD on 07/09/2008   Method used:   Faxed to ...       [facility] (retail)       [address], [place]  [address]       Ph: [phone]       Fax: [phone]   RxID:   [id number] PRINZIDE 20-12.5 MG TABS (LISINOPRIL-HYDROCHLOROTHIAZIDE) Take 2 tablets two times a day for blood pressure  #60 x 0   Entered and Authorized by:   Danise Mina MD   Signed by:   Danise Mina MD on 07/09/2008   Method used:   Faxed to ...       [facility] (retail)       [address], [place]  [address]       Ph: [phone]       Fax: [phone]   RxID:   [id number]   Laboratory Results   Blood Tests   Date/Time Received: July 09, 2008 1:40 PM  Date/Time Reported: July 09, 2008 1:50 PM   HGBA1C: 7.9%   (Normal Range: Non-Diabetic - 3-6%   Control Diabetic - 6-8%)  Comments: ...........test performed by...........Marland KitchenHedy Camara, CMA     Appended Document: high cbgs lately, abd pain off & on Patient had heme cards that were reported as negative on 05/05/08.

## 2010-05-11 NOTE — Assessment & Plan Note (Signed)
Summary: [place] for dizziness/falls/kf/red   Vital Signs:  Patient profile:   65 year old female Height:      64 inches Weight:      154 pounds Temp:     98.3 degrees F oral Pulse rate:   65 / minute BP sitting:   162 / 87  (left arm) Cuff size:   regular  Vitals Entered By: Enid Skeens, CMA (January 29, 2010 4:17 PM) CC: dizziness and off balance x3 days. hearing decreased and ears feel full Is Patient Diabetic? Yes   Primary Care Provider:  Eugenie Norrie  MD  CC:  dizziness and off balance x3 days. hearing decreased and ears feel full.  History of Present Illness: 1. 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  She was doing well up until about 3 days ago when she started having dizziness again.  Described like the room was spinning.  This feels the same as her prior dizzy episodes but is a little bit worse.  She has been running into things and have fallen a couple of times.  The dizziness is worse when she is turning her head.  When she keeps her head straight it is not as bad.  She has been taking Meclizine once a day.  It helps a little bit.  She has been out of the [place].  She has some associated nausea but no vomitting.    ROS: denies numbness / weakness, vision changes.  endorses feeling like her ears are clogged up and some left leg pain.  Denies fevers.  PMHx: BPPV  Habits & Providers  Alcohol-Tobacco-Diet     Tobacco Status: never  Current Medications (verified): 1)  Bayer Childrens Aspirin 81 Mg Chew (Aspirin) .... Take 1 Tablet By Mouth Once A Day 2)  Glucotrol 10 Mg Tabs (Glipizide) .... Take 1 Tablet By Mouth Twice A Day 3)  Truetrack Test  Strp (Glucose Blood) .... Dispense 50 Strips; Use As Directed 4)  Nitroquick 0.4 Mg  Subl (Nitroglycerin) .Marland Kitchen.. 1 Under Your Tongue Every 5 Minutes As Needed.  If Chest Pain Not Better, Call 911 5)  Accuretic 20-12.5 Mg Tabs (Quinapril-Hydrochlorothiazide) .... Take 2 Tabs By Mouth Daily 6)   Carvedilol 25 Mg Tabs (Carvedilol) .... One By Mouth Two Times A Day For Blood Pressure and Heart 7)  Klonopin 0.5 Mg Tabs (Clonazepam) .... One By Mouth 2-3 Times A Day For Dizziness 8)  Accuretic 20-12.5 Mg Tabs (Quinapril-Hydrochlorothiazide) .... 2 Tabs By Mouth Daily For Blood Pressure 9)  Amlodipine Besylate 10 Mg Tabs (Amlodipine Besylate) .... One By Mouth Daily 10)  Lantus 100 Unit/ml Soln (Insulin Glargine) .Marland Kitchen.. 10 Units Subcutaneously Twice A Day;; Disp 1 Months Supply 11)  Clarinex 5 Mg Tabs (Desloratadine) .... One By Mouth Daily For Allergies 12)  Meclizine Hcl 25 Mg Tabs (Meclizine Hcl) .Marland Kitchen.. 1 Tab By Mouth Twice A Day As Needed For Dizziness 13)  Klonopin 0.5 Mg Tabs (Clonazepam) .Marland Kitchen.. 1 Tab By Mouth Twice A Day As Needed For Dizziness 14)  Promethazine Hcl 12.5 Mg Tabs (Promethazine Hcl) .Marland Kitchen.. 1 Tab By Mouth Every 8 Hours As Needed For Nausea  Allergies: No Known Drug Allergies  Physical Exam  General:  vitals signs reviewed -- hypertensive but otherwise normal.  Sitting on exam table keeping head still.  No acute distress Head:  normocephalic and atraumatic.   Eyes:  No corneal or conjunctival inflammation noted. EOMI. Perrla. Funduscopic exam benign, without hemorrhages,  exudates or papilledema. Vision grossly normal. Ears:  External ear exam shows no significant lesions or deformities.  Otoscopic examination reveals clear canals, tympanic membranes are intact bilaterally without bulging, retraction, inflammation or discharge.  Nose:  no external deformity and no nasal discharge.   Mouth:  Oral mucosa and oropharynx without lesions or exudates.   Neck:  supple, full ROM, and no masses.   Lungs:  work of breathing unlabored, clear to auscultation bilaterally; no wheezes, rales, or ronchi; good air movement throughout  Heart:  regular rate and rhythm, no murmurs; normal s1/s2  Abdomen:  soft, non-tender, and normal bowel sounds.   Extremities:  no LE edema Neurologic:  +  Dix-HallPike CNII-XII Intact.  Strength and sensation intact in all extremities.  Normal gait.   Impression & Recommendations:  Problem # 1:  BENIGN PAROXYSMAL POSITIONAL VERTIGO (ICD-386.11) Assessment Deteriorated  Worse over the past couple of days.  Will provide Meclizine in increased dose and Klonopin in this acute period.  Will also provide Phenergan for pain.  Would recommend sending pt back to PT for vertigo therapy.  Will have patient follow up with PCP in 1 week. Her updated medication list for this problem includes:    Clarinex 5 Mg Tabs (Desloratadine) ..... One by mouth daily for allergies    Meclizine Hcl 25 Mg Tabs (Meclizine hcl) .Marland Kitchen... 1 tab by mouth twice a day as needed for dizziness    Promethazine Hcl 12.5 Mg Tabs (Promethazine hcl) .Marland Kitchen... 1 tab by mouth every 8 hours as needed for nausea  Orders: [facility]- Est Level  3 [id number])  Complete Medication List: 1)  Bayer Childrens Aspirin 81 Mg Chew (Aspirin) .... Take 1 tablet by mouth once a day 2)  Glucotrol 10 Mg Tabs (Glipizide) .... Take 1 tablet by mouth twice a day 3)  Truetrack Test Strp (Glucose blood) .... Dispense 50 strips; use as directed 4)  Nitroquick 0.4 Mg Subl (Nitroglycerin) .Marland Kitchen.. 1 under your tongue every 5 minutes as needed.  if chest pain not better, call 911 5)  Accuretic 20-12.5 Mg Tabs (Quinapril-hydrochlorothiazide) .... Take 2 tabs by mouth daily 6)  Carvedilol 25 Mg Tabs (Carvedilol) .... One by mouth two times a day for blood pressure and heart 7)  Klonopin 0.5 Mg Tabs (Clonazepam) .... One by mouth 2-3 times a day for dizziness 8)  Accuretic 20-12.5 Mg Tabs (Quinapril-hydrochlorothiazide) .... 2 tabs by mouth daily for blood pressure 9)  Amlodipine Besylate 10 Mg Tabs (Amlodipine besylate) .... One by mouth daily 10)  Lantus 100 Unit/ml Soln (Insulin glargine) .Marland Kitchen.. 10 units subcutaneously twice a day;; disp 1 months supply 11)  Clarinex 5 Mg Tabs (Desloratadine) .... One by mouth daily for  allergies 12)  Meclizine Hcl 25 Mg Tabs (Meclizine hcl) .Marland Kitchen.. 1 tab by mouth twice a day as needed for dizziness 13)  Klonopin 0.5 Mg Tabs (Clonazepam) .Marland Kitchen.. 1 tab by mouth twice a day as needed for dizziness 14)  Promethazine Hcl 12.5 Mg Tabs (Promethazine hcl) .Marland Kitchen.. 1 tab by mouth every 8 hours as needed for nausea  Patient Instructions: 1)  I know that the dizziness can be very bothersome 2)  We will try the Meclizine in a stronger dose and the Klonopin again to see if we can control your dizziness 3)  I have also provided some Phenergan for nausea 4)  Please schedule an appointment in 1 week with your regular doctor to see how you are doing Prescriptions: PROMETHAZINE HCL 12.5 MG TABS (PROMETHAZINE  HCL) 1 tab by mouth every 8 hours as needed for nausea  #40 x 0   Entered and Authorized by:   Mylinda Latina MD   Signed by:   Mylinda Latina MD on 01/29/2010   Method used:   Print then Give to Patient   RxID:   [id number] KLONOPIN 0.5 MG TABS (CLONAZEPAM) 1 tab by mouth twice a day as needed for dizziness  #30 x 0   Entered and Authorized by:   Mylinda Latina MD   Signed by:   Mylinda Latina MD on 01/29/2010   Method used:   Print then Give to Patient   RxID:   [id number] MECLIZINE HCL 25 MG TABS (MECLIZINE HCL) 1 tab by mouth twice a day as needed for dizziness  #40 x 0   Entered and Authorized by:   Mylinda Latina MD   Signed by:   Mylinda Latina MD on 01/29/2010   Method used:   Print then Give to Patient   RxID:   [id number]    Orders Added: 1)  [facility]- Est Level  3 [id number]

## 2010-05-11 NOTE — Progress Notes (Signed)
Summary: triage   Phone Note From Other Clinic Call back at Home Phone [phone]   Caller: [place]- PT Summary of Call: is at the home of pt and has an onset of rt ear pain & neck pain/blurred vision- loss of appetite, sleeping more.  Initial call taken by: Audie Clear,  Aug 13, 2009 11:03 AM  Follow-up for Phone Call        spoke with the PT. pt c/o R ear pain since last night. pain 7/10.  advised tylenol & warm cloth to outside of ear. She is unable to get here today 224, 195 sugars. states they have benn running high. has not seen eye md in "a long time" advised she call for an appt asap. pt states her dtr will call us for an appt tomorrow Follow-up by: Elige Radon RN,  Aug 13, 2009 11:16 AM  Additional Follow-up for Phone Call Additional follow up Details #1::        spoke with pt. States she's feeling better. BPs are "low" at 114/60 or so. Does not feel bad with these BPs. I stated that this was actually good. Has follow-up with me 5/26. Okay to wait until then. Asked pt to call for any problems before then. She is agreeable. Additional Follow-up by: Eugenie Norrie  MD,  Aug 19, 2009 1:42 PM

## 2010-05-11 NOTE — Miscellaneous (Signed)
Summary: change lisinopril/hctz to accuretic  Medications Added NITROQUICK 0.4 MG  SUBL (NITROGLYCERIN) 1 under your tongue every 5 minutes as needed.  if chest pain not better, call 911 ACCURETIC 20-12.5 MG TABS (QUINAPRIL-HYDROCHLOROTHIAZIDE) 2 tabs by mouth daily for blood pressure       Clinical Lists Changes  Medications: Changed medication from LISINOPRIL-HYDROCHLOROTHIAZIDE 20-12.5 MG TABS (LISINOPRIL-HYDROCHLOROTHIAZIDE) take 2 tablets daily for blood pressure to ACCURETIC 20-12.5 MG TABS (QUINAPRIL-HYDROCHLOROTHIAZIDE) 2 tabs by mouth daily for blood pressure - Signed Changed medication from NITROQUICK 0.4 MG  SUBL (NITROGLYCERIN) 1 under your tongue every 5 minutes as needed.  if chest pain not better, call 911 to NITROQUICK 0.4 MG  SUBL (NITROGLYCERIN) 1 under your tongue every 5 minutes as needed.  if chest pain not better, call 911 - Signed Rx of ACCURETIC 20-12.5 MG TABS (QUINAPRIL-HYDROCHLOROTHIAZIDE) 2 tabs by mouth daily for blood pressure;  #60 x 2;  Signed;  Entered by: Eugenie Norrie  MD;  Authorized by: Eugenie Norrie  MD;  Method used: Faxed to [facility], [place], [place], [place]  [address], Ph: [phone], Fax: [phone] Rx of NITROQUICK 0.4 MG  SUBL (NITROGLYCERIN) 1 under your tongue every 5 minutes as needed.  if chest pain not better, call 911;  #25 x 0;  Signed;  Entered by: Eugenie Norrie  MD;  Authorized by: Eugenie Norrie  MD;  Method used: Faxed to [facility], [place], [place], [place]  [address], Ph: [phone], Fax: [phone]    Prescriptions: NITROQUICK 0.4 MG  SUBL (NITROGLYCERIN) 1 under your tongue every 5 minutes as needed.  if chest pain not better, call 911  #25 x 0   Entered and Authorized by:   Eugenie Norrie  MD   Signed by:   Eugenie Norrie  MD on 07/03/2009   Method used:   Faxed to ...       [facility] (retail)       [place], [place]  [address]       Ph: [phone]       Fax: [phone]   RxID:   [id number] ACCURETIC 20-12.5 MG TABS (QUINAPRIL-HYDROCHLOROTHIAZIDE) 2 tabs by mouth daily for blood pressure  #60 x 2   Entered and Authorized by:   Eugenie Norrie  MD   Signed by:   Eugenie Norrie  MD on 07/03/2009   Method used:   Faxed to ...       [facility] (retail)       [address] [place], [place]  [address]       Ph: [phone]       Fax: [phone]   RxID:   [id number]

## 2010-05-11 NOTE — Assessment & Plan Note (Signed)
Summary: cpe,tcb   Vital Signs:  Patient profile:   65 year old female Height:      64 inches Weight:      158 pounds BMI:     27.22 BSA:     1.77 Temp:     97.9 degrees F Pulse rate:   77 / minute BP sitting:   174 / 82  Vitals Entered By: Christen Bame CMA (Sep 03, 2008 11:45 AM) CC: CPE Pain Assessment Patient in pain? no        History of Present Illness: 1. Pt reports difficulties with balance States she gets off-balance 2-3 times per week and falls. Denies any syncope or LOC. No serious injury. Does not use a walking aid. Has been seen in ED for this previously.  2. Prevention s/p hysto; h/o colon cancer - has upcoming monitor colonoscopy 9/10 (last in 06).   Last Lipid ProfileCholesterol: 141 (12/26/2007 8:17:00 PM)HDL:  44 (12/26/2007 8:17:00 PM)LDL:  70 (12/26/2007 8:17:00 PM)Triglycerides:  Last Liver profileSGOT:  12 (05/05/2008 8:20:00 PM)SPGT:  8 (05/05/2008 8:20:00 PM)T. Bili:  0.6 (05/05/2008 8:20:00 PM)Alk Phos:  76 (05/05/2008 8:20:00 PM)  3. diabetes Has not been taking oral agent or lantus. CBG in rom is 109. Hasn't had anything to eat or drink today.  Note that pt has not been able to get meds for last 2 weeks due to income and MAP set-up not going through yet.    Current Medications (verified): 1)  Bayer Childrens Aspirin 81 Mg Chew (Aspirin) .... Take 1 Tablet By Mouth Once A Day 2)  Glucotrol 10 Mg Tabs (Glipizide) .... Take 1 Tablet By Mouth Twice A Day 3)  Lantus 100 Unit/ml Soln (Insulin Glargine) .... Inject 32 Units Sq Bid. Dispense One Month's Supply 4)  Truetrack Test  Strp (Glucose Blood) .... Dispense 50 Strips; Use As Directed 5)  Nitroquick 0.4 Mg  Subl (Nitroglycerin) .Marland Kitchen.. 1 Under Your Tongue Every 5 Minutes As Needed.  If Chest Pain Not Better, Call 911 6)  Prinzide 20-12.5 Mg Tabs (Lisinopril-Hydrochlorothiazide) .... Take 2 Tablets Two Times A Day For Blood Pressure 7)  Carvedilol 25 Mg Tabs (Carvedilol) .... One By Mouth Two Times  A Day 8)  Hydroxyzine Hcl 25 Mg Tabs (Hydroxyzine Hcl) .... One By Mouth Q6 Hours As Needed Itching. 9)  Norvasc 10 Mg Tabs (Amlodipine Besylate) .... Take One Tablet By Mouth Daily For Blood Pressure 10)  Macrobid 100 Mg Caps (Nitrofurantoin Monohyd Macro) .... Take One Tablet Two Times A Day For 7 Days Prescribed By Macdiarmid, Urologist 11)  Lyrica 75 Mg Caps (Pregabalin) .... Takes One A Day - She Has Samples At Home. Given To Her By Dr. Hoy Morn.  Allergies: No Known Drug Allergies  Past History:  Past Medical History: Last updated: 12/25/2007 colon CA Dx 12/23/04 - sees Dr. Jamse Arn. surg 02/25/05 chemo sche x 12 CTs 7/07: no mets, interdigital neuroma right foot, moderatly differntiated adenocarcinoma with, mucinous feautres., s/p 6 mo chemo. last tx 10-14-05 meningioma diagnosed 11/08 hysterectomy in 77 for bleeding TMJ surgery 90 bladder tack in 89 G2P2002  Past Surgical History: Last updated: 09/01/2006 bladder tack -,  cardiac Cath 03/31/04 no sig. CA dz - 04/15/2004,  Cardiolite 11/04-nl, EF 68%, no ischemia - 02/25/2003,  colonoscopy: adenomatous polyp 2003 - 09/10/1999,  EGD 7/98, 7/03 (H.pylori) gastritis -,  ETT 12/20 + - 04/15/2004,  hysterectomy -,  Nissen fundiplication (Q000111Q) - Q000111Q  Family History: Last updated: 12/18/2007 Daughter healthy., Father with DM, dialysis, HTN,  no CAD.  Mom left when patient was 77 yo.  Son commited suicide. No premature heart disease  Physical Exam  General:  alert, well-developed, well-nourished, well-hydrated, and overweight-appearing.   Eyes:  No corneal or conjunctival inflammation noted. EOMI. Perrla. Funduscopic exam benign, without hemorrhages, exudates or papilledema. Vision grossly normal. Ears:  External ear exam shows no significant lesions or deformities.  Otoscopic examination reveals clear canals, tympanic membranes are intact bilaterally without bulging, retraction, inflammation or discharge. Hearing is grossly normal  bilaterally. Mouth:  Oral mucosa and oropharynx without lesions or exudates.  Teeth in good repair. Neck:  No deformities, masses, or tenderness noted. no JVD Lungs:  Normal respiratory effort, chest expands symmetrically. Lungs are clear to auscultation, no crackles or wheezes. Heart:  Normal rate and regular rhythm. S1 and S2 normal without gallop, murmur, click, rub or other extra sounds. Abdomen:  soft, non-tender, normal bowel sounds, no distention, no masses, and no guarding.   Msk:  Normal posture and gait.   Pulses:  1+ BLE pitting edema Extremities:  No clubbing, cyanosis, edema, or deformity noted with normal full range of motion of all joints.   Neurologic:  alert & oriented X3, cranial nerves II-XII intact, strength normal in all extremities, gait normal, DTRs symmetrical and normal, finger-to-nose normal, toes down bilaterally on Babinski, and Romberg negative. Gait is steady. No obvious problems with ambulation. Skin:  turgor normal, color normal, no rashes, and no suspicious lesions.   Psych:  Cognition and judgment appear intact. Alert and cooperative with normal attention span and concentration. No apparent delusions, illusions, hallucinations  Diabetes Management Exam:       Nails:          Left foot: normal          Right foot: normal   Impression & Recommendations:  Problem # 1:  PHYSICAL EXAMINATION (ICD-V70.0) Assessment Unchanged  Will send meds to [facility] - most on $4 list. History of falls is concerning. Will have back soon to reevaluate. Will not restart amlodipine given cost and not wanting to start too many meds at once.  Orders: [facility] - Est  40-64 yrs [id number])  Problem # 2:  DIABETES MELLITUS II, UNCOMPLICATED ([id number]) Assessment: Comment Only Very surprising that glucose is 109 given no meds for past 2 weeks. Will continue to watch. Will not restart lantus as do not want to cause hypoglycemia given these numbers.  The following medications were removed  from the medication list:    Lantus 100 Unit/ml Soln (Insulin glargine) ..... Inject 32 units sq bid. dispense one month's supply Her updated medication list for this problem includes:    Bayer Childrens Aspirin 81 Mg Chew (Aspirin) .Marland Kitchen... Take 1 tablet by mouth once a day    Glucotrol 10 Mg Tabs (Glipizide) .Marland Kitchen... Take 1 tablet by mouth twice a day    Prinzide 20-12.5 Mg Tabs (Lisinopril-hydrochlorothiazide) .Marland Kitchen... Take 2 tablets once a day for blood pressure  Orders: Glucose Cap-[facility] [id number]) Comp Met-[facility] [id number])  Problem # 3:  HYPERTENSION, BENIGN SYSTEMIC (ICD-401.1) Assessment: Comment Only not on meds. Will restart and reevaluate soon.  The following medications were removed from the medication list:    Norvasc 10 Mg Tabs (Amlodipine besylate) .Marland Kitchen... Take one tablet by mouth daily for blood pressure Her updated medication list for this problem includes:    Prinzide 20-12.5 Mg Tabs (Lisinopril-hydrochlorothiazide) .Marland Kitchen... Take 2 tablets once a day for blood pressure    Carvedilol 25 Mg Tabs (Carvedilol) ..... One by  mouth two times a day for blood pressure and heart  Complete Medication List: 1)  Bayer Childrens Aspirin 81 Mg Chew (Aspirin) .... Take 1 tablet by mouth once a day 2)  Glucotrol 10 Mg Tabs (Glipizide) .... Take 1 tablet by mouth twice a day 3)  Truetrack Test Strp (Glucose blood) .... Dispense 50 strips; use as directed 4)  Nitroquick 0.4 Mg Subl (Nitroglycerin) .Marland Kitchen.. 1 under your tongue every 5 minutes as needed.  if chest pain not better, call 911 5)  Prinzide 20-12.5 Mg Tabs (Lisinopril-hydrochlorothiazide) .... Take 2 tablets once a day for blood pressure 6)  Carvedilol 25 Mg Tabs (Carvedilol) .... One by mouth two times a day for blood pressure and heart 7)  Hydroxyzine Hcl 25 Mg Tabs (Hydroxyzine hcl) .... One by mouth q6 hours as needed itching.  Patient Instructions: 1)  I will send a prescription for your medicines to [facility]. 2)  Call MAP to see when your  medicines will be available. 3)  follow-up with me within the next month to discuss falls and diabetes. 4)  I'll keep you off the lantus for now. 5)  Use a cane to help with your balance. Prescriptions: CARVEDILOL 25 MG TABS (CARVEDILOL) one by mouth two times a day for blood pressure and heart  #60 x 3   Entered and Authorized by:   Eugenie Norrie  MD   Signed by:   Eugenie Norrie  MD on 09/03/2008   Method used:   Electronically to        [facility] [phone]* (retail)       [place], [place]  [address]       Ph: [phone]       Fax: [phone]   [id number] HYDROXYZINE HCL 25 MG TABS (HYDROXYZINE HCL) one by mouth q6 hours as needed itching.  #30 x 2   Entered and Authorized by:   Eugenie Norrie  MD   Signed by:   Eugenie Norrie  MD on 09/03/2008   Method used:   Electronically to        [name] [phone]* (retail)       [place], [place]  [address]       Ph: [phone]       Fax: [phone]   [id number] PRINZIDE 20-12.5 MG TABS (LISINOPRIL-HYDROCHLOROTHIAZIDE) Take 2 tablets once a day for blood pressure  #60 x 3   Entered and Authorized by:   Eugenie Norrie  MD   Signed by:   Eugenie Norrie  MD on 09/03/2008   Method used:   Electronically to        [facility] [phone]* (retail)       [address]       [place], [place]  [address]       Ph: [phone]       Fax: [phone]   [id number] TEST  STRP (GLUCOSE BLOOD) dispense 50 strips; use as directed  #1 x 300   Entered and Authorized by:   Eugenie Norrie  MD   Signed by:   Eugenie Norrie  MD on 09/03/2008   Method used:   Electronically to        [facility] [phone]* (retail)       [place], [place]  [id number]       Ph: [phone]       Fax: [phone]   [id number] GLUCOTROL 10 MG TABS (GLIPIZIDE) Take 1 tablet by mouth twice a day  #62 x 3   Entered and  Authorized by:   Eugenie Norrie  MD   Signed by:   Eugenie Norrie  MD on 09/03/2008   Method used:   Electronically to        [facility] [phone]* (retail)       [place], [place]  [address]       Ph: [phone]       Fax: [phone]   RxID:   [id number] BAYER CHILDRENS ASPIRIN 81 MG CHEW (ASPIRIN) Take 1 tablet by mouth once a day  #90 x 11   Entered and Authorized by:   Eugenie Norrie  MD   Signed by:   Eugenie Norrie  MD on 09/03/2008   Method used:   Electronically to        [facility] [phone]* (retail)       [address]       [place], [place]  [address]       Ph: [phone]       Fax: [phone]   [id number]       History of Present Illness: 1. Pt reports difficulties with balance States she gets off-balance 2-3 times per week and falls. Denies any syncope or LOC. No serious injury. Does not use a walking aid. Has been seen in ED for this previously.  2. Prevention s/p hysto; h/o colon cancer - has upcoming monitor colonoscopy 9/10 (last in 06).   Last Lipid ProfileCholesterol: 141 (12/26/2007 8:17:00 PM)HDL:  44 (12/26/2007 8:17:00 PM)LDL:  70 (12/26/2007 8:17:00 PM)Triglycerides:  Last Liver profileSGOT:  12 (05/05/2008 8:20:00 PM)SPGT:  8 (05/05/2008 8:20:00 PM)T. Bili:  0.6 (05/05/2008 8:20:00 PM)Alk Phos:  76 (05/05/2008 8:20:00 PM)  3. diabetes Has not been taking oral agent or lantus. CBG in rom is 109. Hasn't had anything to eat or drink today.  Note that pt has not been able to get meds for last 2 weeks due to income and MAP set-up not going through yet.

## 2010-05-11 NOTE — Miscellaneous (Signed)
Summary: ED visit for UTI                    PHYSICIAN DOCUMENTATION SHEET          Thu Jan 21 01:05:12 EST 2010            [place]       [address]       [place], [place] [address]        PHONE: [phone]      MRN:  [id number]        Account #: [id number]   Name: [name], [name]       Sex: F   Age: 65         DOB: 1945-09-21   Complaint: Abdominal pain      Primary Diagnosis: Urinary tract            infection   Arrival Time: 04/30/2008 18:14      Discharge Time: 05/01/2008 01:05   All Providers: Dr. Delora Fuel, MD PSA   --------------------------------------------------------------------------------------         PROVIDER: Dr. Delora Fuel, MD PSA        HPI:       The patient is a 65 year old female who presents with a chief complaint of abdom-       inal pain. The history was provided by the patient. The patient was seen at 09:03       PM. She had similar symptoms in the past with a UTI and  also  bladder  prolapse.       The  abdominal  pain  started yesterday. The onset was acute. The Pattern is con-       stant. The Course is persistent. The abdominal  pain  is  located  in  the lower       abdomen. The abdominal pain radiates to the left arm, left chest and left jaw. It       is characterized as vague pain. The patient's pain was 10 out of 10 at its worst.       The  patient's pain is 5 out of 10 now. The symptoms are described as severe. The       condition is aggravated by urination. The condition is relieved by nothing.  The       symptoms  have  been  associated  with dysuria,  while the symptoms have not been       associated with constipation, cough, diarrhea, dyspnea, nausea, numbness or  vom-       iting.  The patient was treated prior to ED evaluation by no one. The patient has       a significant history of similar symptoms previously, brain tumor, colon  cancer,       diabetes and hypertension.     21:35 [id number] by Delora Fuel, MD PSA, Dr.           Leonard Downing:       Statement: all systems negative except as marked or noted in the HPI     21:32 [id number] by Delora Fuel, MD PSA, Dr.           [facility]:       Documentation: physician reviewed/amended       Historian: patient       Past medical history: hypertension, diabetes, brain tumor, cancer of colon       Surgical History: bladder tacking,  colectomy, hysterectomy, Nissen fundoplication       Social History: non-smoker, non-drinker, no drug abuse          +--------------------------------------------------------------------------------+       +     Allergies         +       +---------------------+----------------------------+-----------------------------+       +    Drug    +     Reaction        +   Allergy Note      +       +---------------------+----------------------------+-----------------------------+       +    NKDA    +          +        +       +---------------------+----------------------------+-----------------------------+     21:32 [id number] by Delora Fuel, MD PSA, Dr.           Epifania Gore Medications:       Documentation: physician reviewed/amended          +--------------------------------------------------------------------------------+       +           Medications         +       +----------------------+-------------------+---------------------+---------------+       +   Medication    +   [Medication]    +     Frequency      +  Last Dose   +       +      +   Dosage        +       +       +       +----------------------+-------------------+---------------------+---------------+       +  Ativan Oral    +   1 mg        +   every 12 hours    +       +       +----------------------+-------------------+---------------------+---------------+       +  Aspirin Oral    +   81 mg        +   once a day      +       +       +----------------------+-------------------+---------------------+---------------+       +  Glucotrol Oral    +   10 mg        +    twice a day      +       +       +----------------------+-------------------+---------------------+---------------+       +  LanTUS SubQ    +   22 units        +   twice a day      +       +       +----------------------+-------------------+---------------------+---------------+       +  Nitroglycerin SL    +         +   as needed      +       +       +----------------------+-------------------+---------------------+---------------+       +  TrueTrack Smart    +         +       +       +       +  System InVt    +         +       +       +       +----------------------+-------------------+---------------------+---------------+       +  Lisinopril-    +         +       +       +       +  Hydrochloroth-    +         +       +       +       +  iazide Oral    +         +       +       +       +----------------------+-------------------+---------------------+---------------+     21:32 [id number] by Delora Fuel, MD PSA, Dr.           Physical examination:       Vital signs and O2 SAT: reviewed, normal except, hypertensive       Constitutional: well developed, well nourished, well hydrated, in no  acute  dis-       tress       Head and Face: normocephalic, atraumatic       Eyes: EOMI, PERRL, no scleral icterus       ENMT: ears, nose and throat normal       Neck:  supple, full range of motion, no meningeal signs, no lymphadenopathy, non-       tender       Spine: entire spine non-tender       Cardiovascular: regular rate and rhythm, no murmur, rub, or gallop       Respiratory: normal, breath sounds equal bilaterally, no rales, rhonchi, wheezes,       or rub       Chest: movement normal, non-tender       Abdomen:  soft,  nontender, nondistended, no masses, no hepatosplenomegaly, bowel       Sounds present       Genitourinary: no CVA tenderness       Extremities: full range of motion, no edema, normal appearance       Neuro: Cranial Nerves II-XII intact, motor intact in all  extremities,   sensation       normal , normal reflexes, normal coordination, normal speech, a T Ox3       Skin: color normal, no rash, warm, dry       Psychiatric:  no  abnormalities  of  mood  or  affect, alert and oriented person,       place, and time, memory intact       Lymph: no palpable or tender nodes     21:32 [id number] by Delora Fuel, MD PSA, Dr.           Reviewed result:       Result Type: Dorisann Frames: [id number]       Step Type: LAB       Procedure Name: CBC WITH DIFF        Procedure: CBC WITH DIFF        Result:        WBC COUNT    11.7       K/uL  [4.0-10.5]      H        RBC COUNT    4.50       MIL/uL  [3.87-5.11]        HEMOGLOBIN    13.0       g/dL  [12.0-15.0]        HEMATOCRIT  38.8       %   [36.0-46.0]        MCV    86.3       fL  [78.0-100.0]        MCHC    33.5       g/dL  [30.0-36.0]        RDW    13.2       %   [11.5-15.5]        PLATELET COUNT   248       K/uL  [150-400]        NEUTROPHIL    69       %   [43-77]        ABS GRANULOCYTE   8.1       K/uL  [1.7-7.7]      H        LYMPHOCYTE    24       %   [12-46]        ABS LYMPH    2.8       K/uL  [0.7-4.0]        MONOCYTE    6       %   [3-12]        ABS MONOCYTE   0.7       K/uL  [0.1-1.0]        EOSINOPHIL    1       %   [0-5]        ABS EOS    0.1       K/uL  [0.0-0.7]        BASOPHIL    0        %   [0-1]        ABS BASO    0.0       K/uL  [0.0-0.1]           21:04 [id number] by Delora Fuel, MD PSA, Dr.        Reviewed result:       Result Type: Dorisann Frames: [id number]       Step Type: LAB       Procedure Name: CKMB        Procedure: CKMB        Result:        CK, TOTAL    113       U/L  [7-177]        CK-MB    2.8       ng/mL  [0.3-4.0]        RELATIVE INDEX   2.5    [0.0-2.5]           21:29 [id number] by Delora Fuel, MD PSA, Dr.        Reviewed result:       Result Type: Dorisann Frames: [id number]       Step Type: LAB       Procedure Name:  COMPREHENSIVE METABOLIC PANEL        Procedure: COMPREHENSIVE METABOLIC PANEL        Procedure Notes: GFR, Est Afr Am - The eGFR has been calculated using  the  MDRD       equation.  This  calculation  has  not been validated in all clinical situations.       eGFR's persistently <60 mL/min signify possible Chronic Kidney Disease.  Result:        SODIUM    141       mEq/L  [135-145]        POTASSIUM    3.2       mEq/L  [3.5-5.1]      L        CHLORIDE    106       mEq/L  [96-112]        CARBON DIOXIDE   29       mEq/L  [19-32]        GLUCOSE    117       mg/dL  [70-99]      H        BUN    8       mg/dL  [6-23]        CREATININE    0.74       mg/dL  [0.4-1.2]        CALCIUM    9.2       mg/dL  [8.4-10.5]        TOTAL PROTEIN   6.9       g/dL  [6.0-8.3]        ALBUMIN    3.5       g/dL  [3.5-5.2]        AST/SGOT    15       U/L  [0-37]        ALT/SGPT    12       U/L  [0-35]        ALKALINE PHOSPHATASE 85       U/L  [39-117]        BILIRUBIN, TOTAL   1.0       mg/dL  [0.3-1.2]        GFR, Est Non Af Am   >60       mL/min  [>60]        GFR, Est Afr Am   >60       mL/min  [>60]           21:31 [id number] by Delora Fuel, MD PSA, Dr.        Reviewed result:       Result Type: Dorisann Frames: [id number]       Step Type: LAB       Procedure Name: TROPONIN I        Procedure: TROPONIN I        Procedure Notes: TROPONIN I - NO INDICATION OF MYOCARDIAL INJURY.        Result:        TROPONIN I    0.01       ng/mL  [0.00-0.06]           21:31 [id number] by Delora Fuel, MD PSA, Dr.        Reviewed result:       Result Type: Dorisann Frames: [id number]       Step Type: XRAY       Procedure Name: DG CHEST 2 VIEW        Procedure: DG CHEST 2 VIEW        Result:              Clinical Data: Left chest pain, hypertension, diabetes           CHEST - 2 VIEW  Comparison: 11/09/2007           Findings:        Upper normal heart size.        Normal  mediastinal contours and pulmonary vascularity.        Minimal chronic peribronchial thickening.        No pulmonary infiltrate, pleural effusion, or pneumothorax.        Bones unremarkable.           IMPRESSION:        No acute abnormalities.                Note/Interpretation: no pneumonia - images viewed by me     21:33 [id number] by Delora Fuel, MD PSA, Dr.        Reviewed result:       Result Type: Dorisann Frames: [id number]       Step Type: LAB       Procedure Name: URINE MACROSCOPIC        Procedure: URINE MACROSCOPIC        Result:        URINE COLOR   YELLOW   [YELLOW]        URINE APPEARANCE   TURBID   [CLEAR]      A        URINE SPEC GRAVITY   1.019    [1.005-1.030]        URINE PH    6.0    [5.0-8.0]        URINE GLUCOSE   NEGATIVE    mg/dL  [NEG]        URINE HEMOGLOBIN   MODERATE   [NEG]       A        URINE BILIRUBIN   NEGATIVE   [NEG]        URINE KETONES   NEGATIVE    mg/dL  [NEG]        URINE TOTAL PROTEIN  30       mg/dL  [NEG]       A        URINE UROBILINOGEN   0.2       mg/dL  [0.0-1.0]        URINE NITRITE   POSITIVE   [NEG]       A        LEUKOCYTE ESTERASE   LARGE    [NEG]       A           00:07 0000000 by Delora Fuel, MD PSA, Dr.        Reviewed result:       Result Type: Dorisann Frames: [id number]       Step Type: LAB       Procedure Name: URINE MICROSCOPIC        Procedure: URINE MICROSCOPIC        Result:        URINE EPITHELIAL   MANY    [RARE]       A        URINE WBC'S TOO NUMEROUS TO COUNT WBC/hpf [<3]        URINE RBC'S   7-10       RBC/hpf  [<3]        BACTERIA    MANY    [RARE]       A  00:07 0000000 by Delora Fuel, MD PSA, Dr.           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     21:32 [id number] by Delora Fuel, MD PSA, Dr.           Cardiac:          +--------------------------------------------------------------------------------+       +        EKG          +       +-----------+-----------+-------------+----------+---------------+---------------+       + EKG Time +   Rate    + Rhythm   +  Axis    +  PQRS  T       +   ST  T  T     +       +  +     +    +      +  Intervals   +       +       +-----------+-----------+-------------+----------+---------------+---------------+       +  +  82 BPM   + normal   + normal   + normal P     + nonspecific  +       +  +     + sinus, no   +      + waves, nor-  + T wave      +       +  +     + ectopy   +      + mal QRS,     + changes      +       +  +     +    +      + normal      +       +       +  +     +    +      + intervals    +       +       +-----------+-----------+-------------+----------+---------------+---------------+             +--------------------------------------------------------------------------------+       +          Compared with previous EKG        +       +------------------------+-------------------------------+-----------------------+       +      Date      +  Comparison      +        Comments      +       +------------------------+-------------------------------+-----------------------+       +   03/13/2008      +       no significant      +        +       +        +       changes       +        +       +------------------------+-------------------------------+-----------------------+  22:40 [id number] by Delora Fuel, MD PSA, Dr.           Patient disposition:       Patient disposition: Disch - Home       Primary Diagnosis: urinary tract infection       Additional diagnoses: hypokalemia            Counseling: advised of diagnosis, advised of treatment plan,  advised  of  xray         and  lab findings,  advised  of need  for close follow-up, advised of need to          return for worsening or changing symptoms, patient voices understanding     00:08 0000000 by Delora Fuel, MD PSA, Dr.           Prescriptions:            +------------------------------------------------------------------------------+         +           Prescription         +         +-----------------------------+----------------------+-------------------------+         +        Medication     +    Dispense    +      Sig Line      +         +-----------------------------+----------------------+-------------------------+         +   Cipro 500 mg Tab     +   Twenty (20)    + One PO BID      +         +-----------------------------+----------------------+-------------------------+         +   Potassium Chloride S R     +   Twenty (20)    + One tablet PO BID    +         +   20 mEq Tab, Parti-     +      +        +         +   cles/Crystals      +      +        +         +-----------------------------+----------------------+-------------------------+            Drug interactions:       +----------------------------------------------------------------------------+    +        Drug interactions         +    +-------------+-------------------+---------------------+--------------------+    +       +     +   +       +    +-------------+-------------------+---------------------+--------------------+    + Moderate    +  Lisinopril-   +   Potassium Chlo- +  ACE INHIBITORS;   +    +       +  Hydrochloroth-   +   ride Oral  +  ARB'S/POTASSIUM   +    +       +  iazide Oral   +   +  PREPARATIONS      +    +-------------+-------------------+---------------------+--------------------+    + Moderate    +  Glucotrol Oral   +   Cipro Oral +  ANTIDIABETIC      +    +       +     +   +  AGENTS/SELECTED   +    +       +     +   +  QUINOLONES      +    +-------------+-------------------+---------------------+--------------------+    + Moderate    +  LanTUS SubQ   +   Cipro Oral +  ANTIDIABETIC      +     +       +     +   +  AGENTS/SELECTED   +    +       +     +   +  QUINOLONES      +    +-------------+-------------------+---------------------+--------------------+     22:42 99991111 by Delora Fuel, MD PSA, Dr.           Medication disposition:          +--------------------------------------------------------------------------------+       +           Medications         +       +---------------+-------------+------------+-----------+------------+------------+       +  Medication   +[Medication] + Frequency  +Last Dose  +Medication +PCP contact +       +      +Dosage   +        +    +disposition +      +       +---------------+-------------+------------+-----------+------------+------------+       +Ativan Oral    +1 mg   +every 12    +    +continue +      +       +      +    +hours       +    +  +      +       +---------------+-------------+------------+-----------+------------+------------+       +Aspirin Oral   +81 mg   +once a day  +    +continue +      +       +---------------+-------------+------------+-----------+------------+------------+       +Glucotrol     +10 mg   +twice a day +    +continue +      +       +Oral     +    +        +    +  +      +       +---------------+-------------+------------+-----------+------------+------------+       +LanTUS SubQ    +22 units   +twice a day +    +continue +      +       +---------------+-------------+------------+-----------+------------+------------+       +Nitroglyc-     +    +as needed   +    +continue +      +       +erin SL     +    +        +    +  +      +       +---------------+-------------+------------+-----------+------------+------------+       +TrueTrack     +    +        +    +continue +      +       +Smart System   +    +        +    +  +      +       +  InVt     +    +        +    +  +      +       +---------------+-------------+------------+-----------+------------+------------+       +Lisinopril-    +    +         +    +continue +      +       +Hydrochloroth- +    +        +    +  +      +       +iazide Oral    +    +        +    +  +      +       +---------------+-------------+------------+-----------+------------+------------+     22:42 [id number] by Delora Fuel, MD PSA, Dr.           Discharge:       Discharge Instructions:         hypokalemia - with potassium supplementation, urinary tract infection          Append a Note to Discharge Instructions: Return if symptoms are getting worse.          +--------------------------------------------------------------------------------+       +      Referral/Appointment         +       +----------------------+--------------------+-------------------+----------------+       +  Refer Patient To:   +   Phone Number: +   Follow-up in    +  Appointment   +       +      +   +      +  Details:      +       +----------------------+--------------------+-------------------+----------------+       +  Family Practice    +   +   5 days     +       +       +  Center,     +   +      +       +       +----------------------+--------------------+-------------------+----------------+          Drug Instructions:         antibiotic fluoroquinolones, potassium     22:42 [id number] by Delora Fuel, MD PSA, Dr.           Hyman Bible electronically signed by ER Physician     22:43 [id number] by Delora Fuel, MD PSA, Dr.        Hyman Bible electronically signed by Responsible Physician     00:09 0000000 by Delora Fuel, MD PSA, Dr.           ED Course:          +--------------------------------------------------------------------------------+       +      Course          +       +-------------+-----------+---------------+-----------+--------------+-----------+       + Assessment  + Treatment +  Medication   +   Route   + Reassessment + Timestamp +       +-------------+-----------+---------------+-----------+--------------+-----------+       +urinary   +       + ciprofloxacin +  PO (oral) +   +      +       +  tract infec- +       +        +    +   +      +       +tion   +       +        +    +   +      +       +-------------+-----------+---------------+-----------+--------------+-----------+       +hypokalemia  +       + potassium     + PO (oral) +   +      +       +    +       + chloride      +    +   +      +       +-------------+-----------+---------------+-----------+--------------+-----------+     00:08 0000000 by Delora Fuel, MD PSA, Dr.

## 2010-05-11 NOTE — Assessment & Plan Note (Signed)
Summary: work in fu wp   Vital Signs:  Patient Profile:   65 Years Old Female Height:     64 inches (162.56 cm) Weight:      158.2 pounds Temp:     98.0 degrees F Pulse rate:   63 / minute BP sitting:   139 / 77  (left arm)  Pt. in pain?   no  Vitals Entered By: Arnette Schaumann RN (December 25, 2007 2:06 PM)                  PCP:  Eugenie Norrie  MD  Chief Complaint:  f/u diabetes.  History of Present Illness: HTN -Taking meds regularly? yes  -Side effects from medications: no -headache? yes, daily -problems with speech, gait, or coordination? denies BP is controlled today. Pt does report persistent blurry vision.   Diabetes Sugars have ranged from 119 - 240s but mostly in the 140-180s for the most part. Taking lantus 30 units two times a day. No hypoglycemic episodes. Gets up to urinate a lot at night. No polydipsia. Reports occasional burning and numbness in feet. Checks feet regularly but not daily. Not foot wounds. Diabetic foot exam performed. Needs lipids and other labs checked but wants to wait until disability/medicaid comes through (hopefully in 2-3 months).  Colon cancer - Pt reports that she has been cancer-free for 2 years as of yesterday. Sees Dr. Jamse Arn. States that she has an appointment sometime next month for follow-up with this and a colonoscopy scheduled next year.     Past Medical History:    colon CA Dx 12/23/04 - sees Dr. Jamse Arn.    surg 02/25/05 chemo sche x 12    CTs 7/07: no mets, interdigital neuroma right foot, moderatly differntiated adenocarcinoma with, mucinous feautres., s/p 6 mo chemo.    last tx 10-14-05    meningioma diagnosed 11/08    hysterectomy in 77 for bleeding    TMJ surgery 89    bladder tack in 89    G2P2002    Last Flex Sig:  Done. (10/09/2001 12:00:00 AM) Flex Sig Next Due:  Not Indicated Last Hemoccult Result: Done. (02/10/2003 12:00:00 AM) Hemoccult Next Due:  Not Indicated PAP Next Due:  Not Indicated     Physical Exam  General:     alert and well-hydrated.   Eyes:     No corneal or conjunctival inflammation noted. EOMI. Perrla. Funduscopic exam benign, without hemorrhages, exudates or papilledema. Vision grossly normal. Lungs:     Normal respiratory effort, chest expands symmetrically. Lungs are clear to auscultation, no crackles or wheezes. Heart:     Normal rate and regular rhythm. S1 and S2 normal without gallop, murmur, click, rub or other extra sounds. Neurologic:     alert & oriented X3, cranial nerves II-XII intact, and strength normal in all extremities.    Diabetes Management Exam:    Foot Exam (with socks and/or shoes not present):       Sensory-Pinprick/Light touch:          Left medial foot (L-4): diminished          Left dorsal foot (L-5): normal          Left lateral foot (S-1): diminished          Right medial foot (L-4): normal          Right dorsal foot (L-5): normal          Right lateral foot (S-1): normal  Sensory-Monofilament:          Left foot: diminished          Right foot: normal       Inspection:          Left foot: normal          Right foot: normal    Impression & Recommendations:  Problem # 1:  HYPERTENSION, BENIGN SYSTEMIC (ICD-401.1) Assessment: Improved Better with lisinopril/hctz. Records from home indicate adequate control.  No medications side effects. Still with HA and blurry visiton. Continue to follow. Her updated medication list for this problem includes:    Lisinopril-hydrochlorothiazide 20-12.5 Mg Tabs (Lisinopril-hydrochlorothiazide) ..... One by mouth daily  Orders: [place]- Est  Level 4 [id number])   Problem # 2:  DIABETES MELLITUS II, UNCOMPLICATED ([id number]) Assessment: Unchanged Discussed A1c and need for continued improvement. Diabetic foot exam done, evidence of neuropathy on monofilament. Discussed foot care daily. Consider diabetic shoes, but again does not have insurance. Needs eye exam but does not have insurance for  this either. Hopefully can get one once medicaid/disability in place. Follow-up. Her updated medication list for this problem includes:    Bayer Childrens Aspirin 81 Mg Chew (Aspirin) .Marland Kitchen... Take 1 tablet by mouth once a day    Glucotrol 10 Mg Tabs (Glipizide) .Marland Kitchen... Take 1 tablet by mouth twice a day    Lantus 100 Unit/ml Soln (Insulin glargine) ..... Inject 30 units sq bid. refills #3    Lisinopril-hydrochlorothiazide 20-12.5 Mg Tabs (Lisinopril-hydrochlorothiazide) ..... One by mouth daily  Orders: [place]- Est  Level 4 (99214)   Problem # 3:  DIABETIC PERIPHERAL NEUROPATHY (ICD-250.60) Assessment: New Evidence of periph. neuropathy on monofilament. Pt mentions some burning in feet. Will not start therapy at this time. Discussed foot care. Her updated medication list for this problem includes:    Bayer Childrens Aspirin 81 Mg Chew (Aspirin) .Marland Kitchen... Take 1 tablet by mouth once a day    Glucotrol 10 Mg Tabs (Glipizide) .Marland Kitchen... Take 1 tablet by mouth twice a day    Lantus 100 Unit/ml Soln (Insulin glargine) ..... Inject 30 units sq bid. refills #3    Lisinopril-hydrochlorothiazide 20-12.5 Mg Tabs (Lisinopril-hydrochlorothiazide) ..... One by mouth daily  Orders: [place]- Est  Level 4 [id number])   Problem # 4:  COLON CANCER (ICD-153.9) Assessment: Unchanged Has follow-up with Dr. Jamse Arn next month and repeat colonoscopy in one year. No symptoms. Continue to follow. Orders: [place]- Est  Level 4 (99214)   Complete Medication List: 1)  Ativan 1 Mg Tabs (Lorazepam) .... Take 1 tablet by mouth every twelve hours 2)  Bayer Childrens Aspirin 81 Mg Chew (Aspirin) .... Take 1 tablet by mouth once a day 3)  Glucotrol 10 Mg Tabs (Glipizide) .... Take 1 tablet by mouth twice a day 4)  Lantus 100 Unit/ml Soln (Insulin glargine) .... Inject 30 units sq bid. refills #3 5)  Truetrack Test Strp (Glucose blood) .... Dispense 50 strips; use as directed 6)  Nitroquick 0.4 Mg Subl (Nitroglycerin) .Marland Kitchen.. 1 under your tongue  every 5 minutes as needed.  if chest pain not better, call 911 7)  Isosorbide Dinitrate 30 Mg Tabs (Isosorbide dinitrate) .Marland Kitchen.. 1 by mouth 4x per day 8)  Lisinopril-hydrochlorothiazide 20-12.5 Mg Tabs (Lisinopril-hydrochlorothiazide) .... One by mouth daily   Patient Instructions: 1)  Schedule a follow-up appointment in 2 months. Schedule a lab appointment this week (you need to be fasting) and we'll check your cholesterol and some other things. 2)  Call our office today  or tomorrow with your dose of Coreg (also called carvedilol).  3)  Continue to watch your sugar intake and your carbohydrate intake to keep your sugar under control. You're better but we still have some work to do. 4)  Schedule a mammogram when possible. 5)  When need to get you an eye appointment and check your cholesterol at some point in the future.   ]

## 2010-05-11 NOTE — Assessment & Plan Note (Signed)
Summary: Dizziness/corey/kf   Vital Signs:  Patient profile:   65 year old female Weight:      155.9 pounds Temp:     98.5 degrees F oral Pulse rate:   60 / minute BP sitting:   171 / 79  (right arm)  Vitals Entered By: Mauricia Area CMA, (February 09, 2010 2:09 PM) CC: dizziness ongoing. worse with head movement to the left. Is Patient Diabetic? Yes Pain Assessment Patient in pain? yes     Location: ears Intensity: 8 Onset of pain  x 2 weeks   Primary Care Provider:  Lynne Leader MD  CC:  dizziness ongoing. worse with head movement to the left..  History of Present Illness: 65 yo F:  1. Vertigo: Intermittent, chronic issue. Worse with head movement to left. Has fallen 3 times. BG this am 198.  Has been Rx Klonopin and Meclizine - reports not improvement with either medication. Family member picking her up today. She can go and stay with her daughter. Hx meningioma - frontal. Labs from previous ER visit reviewed - WNL with exception of hyperglycemia and slightly elevated Cr. No URI s/s. No ear fullness or change in hearing. No CP, SOB, HA, lightheadedness, LE edema, numbness/tingling in hands/feet, rash, fever/chills.  Habits & Providers  Alcohol-Tobacco-Diet     Tobacco Status: never  Current Medications (verified): 1)  Bayer Childrens Aspirin 81 Mg Chew (Aspirin) .... Take 1 Tablet By Mouth Once A Day 2)  Glucotrol 10 Mg Tabs (Glipizide) .... Take 1 Tablet By Mouth Twice A Day 3)  Truetrack Test  Strp (Glucose Blood) .... Dispense 50 Strips; Use As Directed 4)  Nitroquick 0.4 Mg  Subl (Nitroglycerin) .Marland Kitchen.. 1 Under Your Tongue Every 5 Minutes As Needed.  If Chest Pain Not Better, Call 911 5)  Accuretic 20-12.5 Mg Tabs (Quinapril-Hydrochlorothiazide) .... Take 2 Tabs By Mouth Daily 6)  Carvedilol 25 Mg Tabs (Carvedilol) .... One By Mouth Two Times A Day For Blood Pressure and Heart 7)  Klonopin 0.5 Mg Tabs (Clonazepam) .... One By Mouth 2-3 Times A Day For Dizziness 8)   Accuretic 20-12.5 Mg Tabs (Quinapril-Hydrochlorothiazide) .... 2 Tabs By Mouth Daily For Blood Pressure 9)  Amlodipine Besylate 10 Mg Tabs (Amlodipine Besylate) .... One By Mouth Daily 10)  Lantus 100 Unit/ml Soln (Insulin Glargine) .Marland Kitchen.. 10 Units Subcutaneously Twice A Day;; Disp 1 Months Supply 11)  Clarinex 5 Mg Tabs (Desloratadine) .... One By Mouth Daily For Allergies 12)  Meclizine Hcl 25 Mg Tabs (Meclizine Hcl) .Marland Kitchen.. 1 Tab By Mouth Twice A Day As Needed For Dizziness 13)  Valium 5 Mg Tabs (Diazepam) .... Take 1/2 To 1 Three Times A Day While Having Severe Vertigo Symptoms 14)  Promethazine Hcl 12.5 Mg Tabs (Promethazine Hcl) .Marland Kitchen.. 1 Tab By Mouth Every 8 Hours As Needed For Nausea  Allergies (verified): No Known Drug Allergies PMH-FH-SH reviewed for relevance  Review of Systems      See HPI  Physical Exam  General:  Well-developed, well-nourished, in no acute distress; alert, appropriate and cooperative throughout examination. Vitals reviewed. Head:  Normocephalic and atraumatic without obvious abnormalities.  Eyes:  No corneal or conjunctival inflammation noted. EOMI. Perrla. Funduscopic exam benign, without hemorrhages, exudates or papilledema. Vision grossly normal. Ears:  R ear normal and L ear normal.   Nose:  External nasal examination shows no deformity or inflammation. Nasal mucosa are pink and moist without lesions or exudates. Mouth:  Oral mucosa and oropharynx without lesions or exudates.  Lungs:  Normal respiratory effort, chest expands symmetrically. Lungs are clear to auscultation, no crackles or wheezes. Heart:  Regular rate and rhythm, no murmurs; normal S1/S2.  Neurologic:  Alert & oriented X3, cranial nerves II-XII intact, strength normal in all extremities, sensation intact to light touch, and DTRs symmetrical and normal.   Endorsed dizziness with head movement right to left, no nystagmus noted. Psych:  Oriented X3, memory intact for recent and remote, normally  interactive, good eye contact, not anxious appearing, and not depressed appearing.     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 Previous OV notes and UCC note reviewed. No red flags today. Precepted with Dr. Nori Riis. Rx Valium. Discussed sedating effects with patient at length. Patient will be going to stay with daughter. Follow by the end of the week if not starting to improve. Her updated medication list for this problem includes:    Clarinex 5 Mg Tabs (Desloratadine) ..... One by mouth daily for allergies    Meclizine Hcl 25 Mg Tabs (Meclizine hcl) .Marland Kitchen... 1 tab by mouth twice a day as needed for dizziness    Promethazine Hcl 12.5 Mg Tabs (Promethazine hcl) .Marland Kitchen... 1 tab by mouth every 8 hours as needed for nausea  Orders: [facility]- Est Level  3 [id number])  Complete Medication List: 1)  Bayer Childrens Aspirin 81 Mg Chew (Aspirin) .... Take 1 tablet by mouth once a day 2)  Glucotrol 10 Mg Tabs (Glipizide) .... Take 1 tablet by mouth twice a day 3)  Truetrack Test Strp (Glucose blood) .... Dispense 50 strips; use as directed 4)  Nitroquick 0.4 Mg Subl (Nitroglycerin) .Marland Kitchen.. 1 under your tongue every 5 minutes as needed.  if chest pain not better, call 911 5)  Accuretic 20-12.5 Mg Tabs (Quinapril-hydrochlorothiazide) .... Take 2 tabs by mouth daily 6)  Carvedilol 25 Mg Tabs (Carvedilol) .... One by mouth two times a day for blood pressure and heart 7)  Klonopin 0.5 Mg Tabs (Clonazepam) .... One by mouth 2-3 times a day for dizziness 8)  Accuretic 20-12.5 Mg Tabs (Quinapril-hydrochlorothiazide) .... 2 tabs by mouth daily for blood pressure 9)  Amlodipine Besylate 10 Mg Tabs (Amlodipine besylate) .... One by mouth daily 10)  Lantus 100 Unit/ml Soln (Insulin glargine) .Marland Kitchen.. 10 units subcutaneously twice a day;; disp 1 months supply 11)  Clarinex 5 Mg Tabs (Desloratadine) .... One by mouth daily for allergies 12)   Meclizine Hcl 25 Mg Tabs (Meclizine hcl) .Marland Kitchen.. 1 tab by mouth twice a day as needed for dizziness 13)  Valium 5 Mg Tabs (Diazepam) .... Take 1/2 to 1 three times a day while having severe vertigo symptoms 14)  Promethazine Hcl 12.5 Mg Tabs (Promethazine hcl) .Marland Kitchen.. 1 tab by mouth every 8 hours as needed for nausea  Other Orders: [facility]1C-[facility] [id number])  Patient Instructions: 1)  I know that the dizziness can be very bothersome. 2)  We are changing your medicine to Valium. This is very strong! You must take it easy for the next several days. 3)  Please schedule an appointment with your regular doctor to see how you are doing. Prescriptions: VALIUM 5 MG TABS (DIAZEPAM) take 1/2 to 1 three times a day while having severe vertigo symptoms  #15 x 0   Entered and Authorized by:   Briscoe Deutscher DO   Signed by:   Briscoe Deutscher DO on 02/09/2010   Method used:   Print then Give to Patient  RxID:   [id number]    Orders Added: 1)  A1C-[facility] [83036] 2)  [facility]- Est Level  3 [99213]    Laboratory Results   Blood Tests   Date/Time Received: February 09, 2010 2:23 PM  Date/Time Reported: February 09, 2010 2:44 PM   HGBA1C: 8.9%   (Normal Range: Non-Diabetic - 3-6%   Control Diabetic - 6-8%)  Comments: ...............test performed by......Marland KitchenBonnie A. Martinique, MLS (ASCP)cm

## 2010-05-11 NOTE — Assessment & Plan Note (Signed)
Summary: [name] and meds wp   Vital Signs:  Patient Profile:   65 Years Old Female Height:     64 inches (162.56 cm) Weight:      157.4 pounds BMI:     27.12 Temp:     98.0 degrees F oral Pulse rate:   77 / minute BP sitting:   144 / 77  (left arm)  Pt. in pain?   no  Vitals Entered By: Levert Feinstein LPN (March 30, [id number] PM)                  PCP:  Hortencia Pilar MD  Chief Complaint:  f/u on [name] and med refills.  History of Present Illness: 65 yo F with  1.  [name] - am blood sugars 110-115.  post prandial 200s occasioanlly but usually lower 140s.  Hasn't seen optho yet - just got project access.  takes asa, acei.  check feet daily.  2. HTN: No shortness of breath, chest pain, edema.  Tolerating medications well.  Knows medication regimen.  No side effects.  no bps at home are less than 140.  3. syncope/meningioma - no further episodes of losing conciousness.  was supposed to take topamax per neuro but can't afford it.    4. chest pain - only has 2x/month, relieved with NG.  not taking imdur as too $$$.  hasn't seen cards again as just got project access.    5. foot pain - worse p wearing high heels.  on bottom of foot onaly.  worse3 p wearing flip flops and flat shoes,  worse on left.  was bad last pm.  in current shoes, does not hurt.        Physical Exam  General:     Well-developed,well-nourished,in no acute distress; alert,appropriate and cooperative throughout examination Lungs:     Normal respiratory effort, chest expands symmetrically. Lungs are clear to auscultation, no crackles or wheezes. Heart:     Normal rate and regular rhythm. S1 and S2 normal without gallop, murmur, click, rub or other extra sounds. Extremities:     pain between 3rd and 4th MT on palpation.  no bony abnormalities of feet.  normal sensation.      Impression & Recommendations:  Problem # 1:  DIABETES MELLITUS II, UNCOMPLICATED ([id number]) Assessment: Deteriorated good control.   will check HbA1c today.  will increase lisinopril since bp can tolerate it.  emphasized neesds to see optho soon - talk to [facility] about which opthos accept it.   The following medications were removed from the medication list:    Lisinopril 20 Mg Tabs (Lisinopril) .Marland Kitchen... 1 by mouth daily  Her updated medication list for this problem includes:    Bayer Childrens Aspirin 81 Mg Chew (Aspirin) .Marland Kitchen... Take 1 tablet by mouth once a day    Glucotrol 10 Mg Tabs (Glipizide) .Marland Kitchen... Take 1 tablet by mouth twice a day    Lantus 100 Unit/ml Soln (Insulin glargine) ..... Inject 24 units in the morning and 23 units in the evening  dispense # qs 3 months.  refills #3    Lisinopril-hydrochlorothiazide 20-12.5 Mg Tabs (Lisinopril-hydrochlorothiazide) .Marland Kitchen... 2 by mouth daily  Orders: A1C-[facility] [id number]) [place]- Est  Level 4 [id number])   Problem # 2:  HYPERTENSION, BENIGN SYSTEMIC (ICD-401.1) Assessment: Deteriorated not at goal.  will increase lisinopril today and HCTZ to try to to get to goal of 130/80.  check BMET with increasing lisinopril. The following medications were removed from the medication  list:    Hydrochlorothiazide 12.5 Mg Tabs (Hydrochlorothiazide) .Marland Kitchen... Take 1 tablet by mouth once a day    Lisinopril 20 Mg Tabs (Lisinopril) .Marland Kitchen... 1 by mouth daily  Her updated medication list for this problem includes:    Coreg 25 Mg Tabs (Carvedilol) .Marland Kitchen... Take 1 tablet by mouth twice a day    Norvasc 10 Mg Tabs (Amlodipine besylate) .Marland Kitchen... Take 1 tablet once a day    Lisinopril-hydrochlorothiazide 20-12.5 Mg Tabs (Lisinopril-hydrochlorothiazide) .Marland Kitchen... 2 by mouth daily  Orders: [facility] [id number]) [place]- Est  Level 4 [id number])   Problem # 3:  CHEST PAIN (ICD-786.50) Assessment: Improved stable.  not taking imdur so will stop it.  ng as needed.   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  avoid  high heels.  bring in shoes tha thurt and we can put in pads.   Orders: [place] [place]- Est  Level 4 (99214)   Complete Medication List: 1)  Ativan 1 Mg Tabs (Lorazepam) .... Take 1 tablet by mouth every twelve hours 2)  Bayer Childrens Aspirin 81 Mg Chew (Aspirin) .... Take 1 tablet by mouth once a day 3)  Coreg 25 Mg Tabs (Carvedilol) .... Take 1 tablet by mouth twice a day 4)  Glucotrol 10 Mg Tabs (Glipizide) .... Take 1 tablet by mouth twice a day 5)  Lantus 100 Unit/ml Soln (Insulin glargine) .... Inject 24 units in the morning and 23 units in the evening  dispense # qs 3 months.  refills #3 6)  Norvasc 10 Mg Tabs (Amlodipine besylate) .... Take 1 tablet once a day 7)  Onetouch Ultra Test Strp (Glucose blood) .... Use 1 strip three times a day 8)  Nitroquick 0.4 Mg Subl (Nitroglycerin) .Marland Kitchen.. 1 under your tongue every 5 minutes as needed.  if chest pain not better, call 911 9)  Isosorbide Dinitrate 30 Mg Tabs (Isosorbide dinitrate) .Marland Kitchen.. 1 by mouth 4x per day 10)  Lisinopril-hydrochlorothiazide 20-12.5 Mg Tabs (Lisinopril-hydrochlorothiazide) .... 2 by mouth daily     Prescriptions: LANTUS 100 UNIT/ML SOLN (INSULIN GLARGINE) Inject 24 units in the morning and 23 units in the evening  Dispense # QS 3 months.  Refills #3  #1 x 3   Entered and Authorized by:   Hortencia Pilar MD   Signed by:   Hortencia Pilar MD on 07/09/2007   Method used:   Print then Give to Patient   RxID:   [id number] NITROQUICK 0.4 MG  SUBL (NITROGLYCERIN) 1 under your tongue every 5 minutes as needed.  if chest pain not better, call 911  #9 x 0   Entered and Authorized by:   Hortencia Pilar MD   Signed by:   Hortencia Pilar MD on 07/09/2007   Method used:   Print then Give to Patient   RxID:   [id number] ONETOUCH ULTRA TEST  STRP (GLUCOSE BLOOD) Use 1 strip three times a day  #300 x 11   Entered and Authorized by:   Hortencia Pilar MD   Signed by:   Hortencia Pilar MD on 07/09/2007   Method used:   Print then Give to  Patient   RxID:   [id number] NORVASC 10 MG TABS (AMLODIPINE BESYLATE) Take 1 tablet once a day  #90 x 3   Entered and Authorized by:   Hortencia Pilar MD   Signed by:   Hortencia Pilar MD on 07/09/2007  Method used:   Print then Give to Patient   RxID:   [id number] LISINOPRIL-HYDROCHLOROTHIAZIDE 20-12.5 MG  TABS (LISINOPRIL-HYDROCHLOROTHIAZIDE) 2 by mouth daily  #180 x 3   Entered and Authorized by:   Hortencia Pilar MD   Signed by:   Hortencia Pilar MD on 07/09/2007   Method used:   Print then Give to Patient   RxID:   [id number] LANTUS 100 UNIT/ML SOLN (INSULIN GLARGINE) Inject 22 units in the morning and 22 units in the evening  Dispense # QS 3 months.  Refills #3  #1 x 3   Entered and Authorized by:   Hortencia Pilar MD   Signed by:   Hortencia Pilar MD on 07/09/2007   Method used:   Print then Give to Patient   RxID:   [id number] GLUCOTROL 10 MG TABS (GLIPIZIDE) Take 1 tablet by mouth twice a day  #90 x 3   Entered and Authorized by:   Hortencia Pilar MD   Signed by:   Hortencia Pilar MD on 07/09/2007   Method used:   Print then Give to Patient   RxID:   [id number] COREG 25 MG TABS (CARVEDILOL) Take 1 tablet by mouth twice a day  #90 x 3   Entered and Authorized by:   Hortencia Pilar MD   Signed by:   Hortencia Pilar MD on 07/09/2007   Method used:   Print then Give to Patient   RxID:   [id number] ATIVAN 1 MG TABS (LORAZEPAM) Take 1 tablet by mouth every twelve hours  #30 x 3   Entered and Authorized by:   Hortencia Pilar MD   Signed by:   Hortencia Pilar MD on 07/09/2007   Method used:   Print then Give to Patient   RxID:   [id number]  ] Laboratory Results   Blood Tests   Date/Time Received: July 09, 2007 3:00  PM  Date/Time Reported: July 09, 2007 3:15 PM   HGBA1C: 8.6%   (Normal Range: Non-Diabetic - 3-6%   Control Diabetic - 6-8%)  Comments: ...............test performed by......Marland KitchenBonnie A. Martinique, [place] (ASCP)

## 2010-05-11 NOTE — Progress Notes (Signed)
Summary: needs orders   Phone Note From Other Clinic Call back at [phone] x [phone]   [facility] Summary [facility]: need verbal orders to get a commode Initial call taken by: Audie Clear,  Aug 11, 2009 9:36 AM  Follow-up for Phone Call        order for commode given Follow-up by: Eugenie Norrie  MD,  Aug 11, 2009 1:59 PM

## 2010-05-11 NOTE — Progress Notes (Signed)
Summary: phn msg   Phone Note Other Incoming Call back at [phone]   Caller: Olean Ree -Pt for [place] Summary of Call: Continues to have high bp of 190/90.  Has not started on new bp meds will start on them today.  Will continue working with her next week to se how she responds to meds.   Initial call taken by: Raymond Gurney,  August 07, 2009 4:36 PM

## 2010-05-11 NOTE — Miscellaneous (Signed)
Summary: [facility]- f/u   Clinical Lists Changes

## 2010-05-11 NOTE — Miscellaneous (Signed)
   Clinical Lists Changes  Problems: Removed problem of ACCIDENTAL FALL (ICD-E888.9) Removed problem of PHYSICAL EXAMINATION (ICD-V70.0) Removed problem of HYPOKALEMIA (ICD-276.8) Changed problem from DIABETES MELLITUS II, UNCOMPLICATED ([id number]) to DIABETES MELLITUS, TYPE II, WITH NEUROLOGICAL COMPLICATIONS (123XX123)
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
## 2010-05-11 NOTE — Assessment & Plan Note (Signed)
Summary: hfu,df   Vital Signs:  Patient profile:   66 year old female Height:      64 inches Weight:      151 pounds BMI:     26.01 BSA:     1.74 Temp:     98.1 degrees F Pulse rate:   77 / minute BP sitting:   155 / 81  Vitals Entered By: Christen Bame CMA (June 16, 2009 3:03 PM) CC: HFU Is Patient Diabetic? Yes Did you bring your meter with you today? No Pain Assessment Patient in pain? no        Primary Care Provider:  Eugenie Norrie  MD  CC:  HFU.  History of Present Illness: Pt here for hospital follow-up after d/c 3/3...diagnosed with BPPV; Head CT negative except for stable menigioma  1. dizziness Sensation of the room spinning. Happens when she "makes the wrong move." Happens many times during the day. Has lost her balance and caught herself. Denies any falls. Denies any injury. Not using a walking aid at this time. Not driving (hasn't been driving for some time now).  Meclizine doesn't seem to improve her symptoms. Has tried to do the exercises for dizziness that PT recommended. Can't afford PT as Medicare/Medicaid won't be active until the first of April.   ROS: some nausea but no vomiting. Decreased appetite. Some problems sleeping due to the dizziness. No problems breathing. No syncope or presyncope.   2. HTN restarted on amlodipine in hospital but BP was low so this was stopped again. Discharged on Lisnionpril / HCTZ 10/12.5mg  -- different from her previous regimen.  3. diabetes A1c in the hospital was  11.2; previously 7.16 October 2008. On glipizide two times a day   Habits & Providers  Alcohol-Tobacco-Diet     Tobacco Status: never  -  Date:  06/10/2009    PLTS 11.2    TSH 1.392  Current Medications (verified): 1)  Bayer Childrens Aspirin 81 Mg Chew (Aspirin) .... Take 1 Tablet By Mouth Once A Day 2)  Glucotrol 10 Mg Tabs (Glipizide) .... Take 1 Tablet By Mouth Twice A Day 3)  Truetrack Test  Strp (Glucose Blood) .... Dispense 50 Strips; Use As  Directed 4)  Nitroquick 0.4 Mg  Subl (Nitroglycerin) .Marland Kitchen.. 1 Under Your Tongue Every 5 Minutes As Needed.  If Chest Pain Not Better, Call 911 5)  Accuretic 20-12.5 Mg Tabs (Quinapril-Hydrochlorothiazide) .... Take 2 Tabs By Mouth Daily 6)  Carvedilol 25 Mg Tabs (Carvedilol) .... One By Mouth Two Times A Day For Blood Pressure and Heart 7)  Klonopin 0.5 Mg Tabs (Clonazepam) .... One By Mouth 2-3 Times A Day For Dizziness 8)  Lisinopril-Hydrochlorothiazide 20-12.5 Mg Tabs (Lisinopril-Hydrochlorothiazide) .... Take 2 Tablets Daily For Blood Pressure  Allergies (verified): No Known Drug Allergies  Social History: Smoking Status:  never  Review of Systems       as per HPI section  Physical Exam  General:  vitals signs reviewed -- hypertensive but otherwise normal  Eyes:  PERRL, EOMI, fundoscopic exam unremarkable  Lungs:  work of breathing unlabored, clear to auscultation bilaterally; no wheezes, rales, or ronchi; good air movement throughout  Heart:  regular rate and rhythm, no murmurs; normal s1/s2  Neurologic:  alert and oriented. speech normal. CN II-XII intact. Station and gait normal. Normal upper and lower extremity strength. CN II-XII intact. PRomberg negative. Normal finger-nose.   Dix-Hallpike ellicits mild rotational nystagmus -- this maneuver was transitioned to Epley maneuver. Pt able to  walk easily and without problems immediately afterward.    Impression & Recommendations:  Problem # 1:  BENIGN PAROXYSMAL POSITIONAL VERTIGO (ICD-386.11) Assessment New  posterior canal vertigo based on PT testing in the hospital. Epley performed today. Will transition from meclizine to klonipin for dizziness. Needs PT but financial constraints prevent. Asked pt to purchase a cane. Stressed that falls are one of her greatest health risks at this point. follow-up in 2 weeks.  Orders: [place]- Est  Level 4 [id number])  Problem # 2:  HYPERTENSION, BENIGN SYSTEMIC (ICD-401.1) Assessment:  Unchanged  Given vertigo, will not alter regimen as do not want to exacerbate symptoms. Consider adding norvasc or replacing diuretic with this at next visit.   Her updated medication list for this problem includes:    Accuretic 20-12.5 Mg Tabs (Quinapril-hydrochlorothiazide) .Marland Kitchen... Take 2 tabs by mouth daily    Carvedilol 25 Mg Tabs (Carvedilol) ..... One by mouth two times a day for blood pressure and heart    Lisinopril-hydrochlorothiazide 20-12.5 Mg Tabs (Lisinopril-hydrochlorothiazide) .Marland Kitchen... Take 2 tablets daily for blood pressure  BP today: 155/81 Prior BP: 155/85 (10/30/2008)  Labs Reviewed: K+: 3.6 (10/30/2008) Creat: : 0.87 (10/30/2008)   Chol: 149 (10/30/2008)   HDL: 46 (10/30/2008)   LDL: 77 (10/30/2008)   TG: 130 (10/30/2008)  Orders: [place]- Est  Level 4 ([address]214)  Problem # 3:  DIABETES MELLITUS II, UNCOMPLICATED ([id number]) Assessment: Deteriorated  a1c greatly deteriorated (11.2). continue glipizide for now. Had been on Lantus previous (> 30 units) but patient has a confusing history of coming off lantus with good control of sugars. Poor health literacy. follow-up in 2 weeks.   Her updated medication list for this problem includes:    Bayer Childrens Aspirin 81 Mg Chew (Aspirin) .Marland Kitchen... Take 1 tablet by mouth once a day    Glucotrol 10 Mg Tabs (Glipizide) .Marland Kitchen... Take 1 tablet by mouth twice a day    Accuretic 20-12.5 Mg Tabs (Quinapril-hydrochlorothiazide) .Marland Kitchen... Take 2 tabs by mouth daily    Lisinopril-hydrochlorothiazide 20-12.5 Mg Tabs (Lisinopril-hydrochlorothiazide) .Marland Kitchen... Take 2 tablets daily for blood pressure  Orders: [place]- Est  Level 4 [id number])  Complete Medication List: 1)  Bayer Childrens Aspirin 81 Mg Chew (Aspirin) .... Take 1 tablet by mouth once a day 2)  Glucotrol 10 Mg Tabs (Glipizide) .... Take 1 tablet by mouth twice a day 3)  Truetrack Test Strp (Glucose blood) .... Dispense 50 strips; use as directed 4)  Nitroquick 0.4 Mg Subl (Nitroglycerin) .Marland Kitchen.. 1 under  your tongue every 5 minutes as needed.  if chest pain not better, call 911 5)  Accuretic 20-12.5 Mg Tabs (Quinapril-hydrochlorothiazide) .... Take 2 tabs by mouth daily 6)  Carvedilol 25 Mg Tabs (Carvedilol) .... One by mouth two times a day for blood pressure and heart 7)  Klonopin 0.5 Mg Tabs (Clonazepam) .... One by mouth 2-3 times a day for dizziness 8)  Lisinopril-hydrochlorothiazide 20-12.5 Mg Tabs (Lisinopril-hydrochlorothiazide) .... Take 2 tablets daily for blood pressure  Patient Instructions: 1)  take the klonopin to help with your dizziness 2)  go to the store and buy a can to help with your balance. 3)  it's important that we work on getting your medicare and medicaid straightened out so that you can get the care that you need. 4)  do the exercises that physical therapy taught you. 5)  follow-up with me in 2 weeks. Prescriptions: LISINOPRIL-HYDROCHLOROTHIAZIDE 20-12.5 MG TABS (LISINOPRIL-HYDROCHLOROTHIAZIDE) take 2 tablets daily for blood pressure  #60 x 2   Entered  and Authorized by:   Eugenie Norrie  MD   Signed by:   Eugenie Norrie  MD on 06/16/2009   Method used:   Faxed to ...       [facility] (retail)       [place], [place]  [address]       Ph: [phone]       Fax: [phone]   RxID:   [id number] KLONOPIN 0.5 MG TABS (CLONAZEPAM) one by mouth 2-3 times a day for dizziness  #60 x 0   Entered and Authorized by:   Eugenie Norrie  MD   Signed by:   Eugenie Norrie  MD on 06/16/2009   Method used:   Print then Give to Patient   RxID:   [id number]    Prevention & Chronic Care Immunizations   Influenza vaccine: Not documented    Tetanus booster: 08/09/1996: Done.   Tetanus booster due: 08/10/2006    Pneumococcal vaccine: Not documented    H. zoster vaccine: Not documented  Colorectal Screening   Hemoccult: normal  (05/14/2008)   Hemoccult due: Not Indicated    Colonoscopy: Done.  (12/10/2004)    Colonoscopy due: 12/11/2014  Other Screening   Pap smear: Not documented   Pap smear due: Not Indicated    Mammogram: Normal  (01/17/2008)   Mammogram due: 01/2009    DXA bone density scan: Not documented   Smoking status: never  (06/16/2009)  Diabetes Mellitus   HgbA1C: 7.8  (10/30/2008)   Hemoglobin A1C due: 10/09/2007    Eye exam: Not documented    Foot exam: yes  (10/30/2008)   High risk foot: Not documented   Foot care education: Not documented   Foot exam due: 12/24/2008    Urine microalbumin/creatinine ratio: Not documented    Diabetes flowsheet reviewed?: Yes   Progress toward A1C goal: Deteriorated  Lipids   Total Cholesterol: 149  (10/30/2008)   LDL: 77  (10/30/2008)   LDL Direct: Not documented   HDL: 46  (10/30/2008)   Triglycerides: 130  (10/30/2008)    SGOT (AST): 13  (09/03/2008)   SGPT (ALT): 12  (09/03/2008)   Alkaline phosphatase: 89  (09/03/2008)   Total bilirubin: 0.7  (09/03/2008)    Lipid flowsheet reviewed?: Yes   Progress toward LDL goal: Unchanged  Hypertension   Last Blood Pressure: 155 / 81  (06/16/2009)   Serum creatinine: 0.87  (10/30/2008)   Serum potassium 3.6  (10/30/2008)    Hypertension flowsheet reviewed?: Yes   Progress toward BP goal: Unchanged  Self-Management Support :   Personal Goals (by the next clinic visit) :     Personal A1C goal: 8  (06/16/2009)     Personal blood pressure goal: 140/90  (06/16/2009)     Personal LDL goal: 70  (06/16/2009)    Diabetes self-management support: Not documented    Hypertension self-management support: Not documented    Lipid self-management support: Not documented

## 2010-05-11 NOTE — Assessment & Plan Note (Signed)
Summary: HTN   Vital Signs:  Patient Profile:   65 Years Old Female Height:     64 inches (162.56 cm) Weight:      160 pounds Temp:     97.9 degrees F Pulse rate:   76 / minute BP sitting:   216 / 89  (left arm)  Pt. in pain?   yes    Location:   headache    Intensity:   9    Type:       aching  Vitals Entered By: Marcell Barlow RN (Aug 23, 2007 8:32 AM)              Is Patient Diabetic? Yes      PCP:  Hortencia Pilar MD  Chief Complaint:  elevated blood pressure and headache.  History of Present Illness: Hx, HTN, meningioma, ? with seizure activity, hx colon cancer s/p resection with chemo 2006, DM  Presents for   High blood pressure - states taking all meds but did not bring bottles today.  Has been doubling up on several pills when BP at home > 210.  These include norvasc, imdur, lisinopril.  Took 2 NTG on tuesday and this gave her a headache as well.  Admits HA right frontal x 3 days.  Sent to ED  08/21/07.  No slurred speech or vision change.   States also with left leg weakness since 0930 last night.  No problems with bowel or bladder.  Has not had neuro follow up was given script for topamax for concern seizure activity with syncopal episodes 2008.  Pt did not start topamax sec to finances.  She has no otyher syncopal episodes since this time.    ** Pt seen with Dr. Walker Kehr**           Physical Exam  General:     Well-developed,well-nourished,in no acute distress; alert,appropriate and cooperative throughout examination Head:     Normocephalic and atraumatic without obvious abnormalities. No apparent alopecia or balding. Eyes:     No corneal or conjunctival inflammation noted. EOMI. Perrla. Funduscopic exam benign, without hemorrhages, exudates or papilledema. Vision grossly normal. Mouth:     Oral mucosa and oropharynx without lesions or exudates.  Teeth in good repair. Neck:     no bruits Lungs:     Normal respiratory effort, chest expands  symmetrically. Lungs are clear to auscultation, no crackles or wheezes. Heart:     Normal rate and regular rhythm. S1 and S2 normal without gallop, murmur, click, rub or other extra sounds. Abdomen:     Bowel sounds positive,abdomen soft and non-tender without masses, organomegaly or hernias noted. Msk:     5/5 UE/ LE strength 4/5 strength left hip flexors/ 5/5 right LE walks on toes and heels without difficulty. 2+ DTR.  Neative rhomberg Extremities:     No clubbing, cyanosis, edema, or deformity noted with normal full range of motion of all joints.   Neurologic:     No cranial nerve deficits noted. Station and gait are normal. Plantar reflexes are down-going bilaterally. DTRs are symmetrical throughout.  See MSK exam as above    Impression & Recommendations:  Problem # 1:  WEAKNESS (ICD-780.79) Multifactorial especially given hx meningioma, and multiple stroke risk factors v/s lumbar pathology for new onset left leg weakness.  Will check CBC/BMET for creat and set up Head CT W/WO contrast and l- spine.   Orders: Basic Met-[facility] [id number]) CT with Contrast (CT w/ contrast)   Problem # 2:  MENINGIOMA (ICD-225.2) Last MRI 01/2007 7X7X8 mm right parafalx meningioma.  Has seen Dr. Erling Cruz in the past.  Pt was supposed to start Topamax but she states she could not afford this med. ? if an EEG revealed seizure activity.  Pt denies seizure like symptoms.  Would recommend f/u on this issue as she may tolerate a cheaper seizure med if truly needed like dilantin Orders: [place]- Est  Level 4 [id number]) CT with Contrast (CT w/ contrast)   Problem # 3:  HYPERTENSION, BENIGN SYSTEMIC (ICD-401.1) Assessment: Deteriorated Recheck confirmed SBP > 200 both arms.  EKG no acute ST-T changes.  Given clonidine in office and to start 0.1 mg two times a day.  Informed to take meds as directed and not to double up on her own.   Her updated medication list for this problem includes:    Coreg 25 Mg Tabs  (Carvedilol) .Marland Kitchen... Take 1 tablet by mouth twice a day    Norvasc 10 Mg Tabs (Amlodipine besylate) .Marland Kitchen... Take 1 tablet once a day    Lisinopril-hydrochlorothiazide 20-12.5 Mg Tabs (Lisinopril-hydrochlorothiazide) .Marland Kitchen... 2 by mouth daily    Catapres 0.1 Mg Tabs (Clonidine hcl) .Marland Kitchen... 1 tab by mouth bid  Orders: [id number] [facility]- Est  Level 4 (99214) CBC-[facility] [id number]) Basic Met-[facility] [id number])  BP today: 216/89 Prior BP: 144/77 (07/09/2007)  Labs Reviewed: Creat: 0.82 (07/09/2007)   Complete Medication List: 1)  Ativan 1 Mg Tabs (Lorazepam) .... Take 1 tablet by mouth every twelve hours 2)  Bayer Childrens Aspirin 81 Mg Chew (Aspirin) .... Take 1 tablet by mouth once a day 3)  Coreg 25 Mg Tabs (Carvedilol) .... Take 1 tablet by mouth twice a day 4)  Glucotrol 10 Mg Tabs (Glipizide) .... Take 1 tablet by mouth twice a day 5)  Lantus 100 Unit/ml Soln (Insulin glargine) .... Inject 24 units in the morning and 23 units in the evening  dispense # qs 3 months.  refills #3 6)  Norvasc 10 Mg Tabs (Amlodipine besylate) .... Take 1 tablet once a day 7)  Onetouch Ultra Test Strp (Glucose blood) .... Use 1 strip three times a day 8)  Nitroquick 0.4 Mg Subl (Nitroglycerin) .Marland Kitchen.. 1 under your tongue every 5 minutes as needed.  if chest pain not better, call 911 9)  Isosorbide Dinitrate 30 Mg Tabs (Isosorbide dinitrate) .Marland Kitchen.. 1 by mouth 4x per day 10)  Lisinopril-hydrochlorothiazide 20-12.5 Mg Tabs (Lisinopril-hydrochlorothiazide) .... 2 by mouth daily 11)  Catapres 0.1 Mg Tabs (Clonidine hcl) .Marland Kitchen.. 1 tab by mouth bid   Patient Instructions: 1)  Follow up with your regular physician in 3-4 days. 2)  Have your head and back CT performed as scheduled. 3)  We will contact you with the results and let you know if further work up needs to be done. 4)  Start clonidine 0.1 mg by mouth two times a day for blood pressure 5)  Take all other meds as prescribed. 6)  If worse weakness, chest pain, slurred speech or stroke  symptoms , call 911.   bp checked in right arm also 205/87  [facility] RN  Aug 23, 2007 8:37 AM   Prescriptions: CATAPRES 0.1 MG  TABS (CLONIDINE HCL) 1 tab by mouth bid  #62 x 0   Entered and Authorized by:   Barth Kirks MD   Signed by:   Barth Kirks MD on 08/23/2007   Method used:   Print then Give to Patient   [id number]  ]  Appended Document: HTN              Complete Medication List: 1)  Ativan 1 Mg Tabs (Lorazepam) .... Take 1 tablet by mouth every twelve hours 2)  Bayer Childrens Aspirin 81 Mg Chew (Aspirin) .... Take 1 tablet by mouth once a day 3)  Coreg 25 Mg Tabs (Carvedilol) .... Take 1 tablet by mouth twice a day 4)  Glucotrol 10 Mg Tabs (Glipizide) .... Take 1 tablet by mouth twice a day 5)  Lantus 100 Unit/ml Soln (Insulin glargine) .... Inject 24 units in the morning and 23 units in the evening  dispense # qs 3 months.  refills #3 6)  Norvasc 10 Mg Tabs (Amlodipine besylate) .... Take 1 tablet once a day 7)  Onetouch Ultra Test Strp (Glucose blood) .... Use 1 strip three times a day 8)  Nitroquick 0.4 Mg Subl (Nitroglycerin) .Marland Kitchen.. 1 under your tongue every 5 minutes as needed.  if chest pain not better, call 911 9)  Isosorbide Dinitrate 30 Mg Tabs (Isosorbide dinitrate) .Marland Kitchen.. 1 by mouth 4x per day 10)  Lisinopril-hydrochlorothiazide 20-12.5 Mg Tabs (Lisinopril-hydrochlorothiazide) .... 2 by mouth daily 11)  Catapres 0.1 Mg Tabs (Clonidine hcl) .Marland Kitchen.. 1 tab by mouth bid    ]   Medication Administration  Medication # 3:    Medication: Clonidine 0.1mg  tab    Diagnosis: HYPERTENSION, BENIGN ESSENTIAL (ICD-401.1)    Dose: 1 tablet    Route: po    Exp Date: 07/11/2007    Lot #: [id number]    Comments: clonidine 0.2 mg. 1/2 tablet given at 9:50 AM with instructions to take other half at 10:50 AM (  one hour later.)    Patient tolerated medication without complications    Given by: Marcell Barlow RN (Aug 23, 2007 10:46  AM)  Orders Added: 1)  Clonidine 0.1mg  tab [EMRORAL]  Appended Document: order for EKG              Complete Medication List: 1)  Ativan 1 Mg Tabs (Lorazepam) .... Take 1 tablet by mouth every twelve hours 2)  Bayer Childrens Aspirin 81 Mg Chew (Aspirin) .... Take 1 tablet by mouth once a day 3)  Coreg 25 Mg Tabs (Carvedilol) .... Take 1 tablet by mouth twice a day 4)  Glucotrol 10 Mg Tabs (Glipizide) .... Take 1 tablet by mouth twice a day 5)  Lantus 100 Unit/ml Soln (Insulin glargine) .... Inject 24 units in the morning and 23 units in the evening  dispense # qs 3 months.  refills #3 6)  Norvasc 10 Mg Tabs (Amlodipine besylate) .... Take 1 tablet once a day 7)  Onetouch Ultra Test Strp (Glucose blood) .... Use 1 strip three times a day 8)  Nitroquick 0.4 Mg Subl (Nitroglycerin) .Marland Kitchen.. 1 under your tongue every 5 minutes as needed.  if chest pain not better, call 911 9)  Isosorbide Dinitrate 30 Mg Tabs (Isosorbide dinitrate) .Marland Kitchen.. 1 by mouth 4x per day 10)  Lisinopril-hydrochlorothiazide 20-12.5 Mg Tabs (Lisinopril-hydrochlorothiazide) .... 2 by mouth daily 11)  Catapres 0.1 Mg Tabs (Clonidine hcl) .Marland Kitchen.. 1 tab by mouth bid    ]

## 2010-05-11 NOTE — Progress Notes (Signed)
Summary: CLARIFY ORDERS   Phone Note Other Incoming Call back at [phone]   Call placed by: BONNIE @ [place] Summary of Call: NEED RETURN CALL TO [facility]. Initial call taken by: Benna Dunks,  Aug 23, 2007 11:02 AM  Follow-up for Phone Call        called and spoke with Horris Latino and clarified question. Follow-up by: Marcell Barlow RN,  Aug 23, 2007 11:08 AM
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
## 2010-05-11 NOTE — Letter (Signed)
Summary: Generic Letter  [facility]  [address]   [place], [place] [address]   Phone: [phone]  Fax: [phone]    12/28/2007  [facility] [place], [place]  [address]  Dear Ms. Aymond,  Please call our office as soon as you can to let us know how many times you take it and where to send your prescription for carvedilol (Coreg). Thank you.  Sincerely,     Eugenie Norrie  MD [facility]  Appended Document: Generic Letter Letter sent to pt via mail    ............................DELORES PATE-GADDY,CMA (AAMA)
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
## 2010-05-11 NOTE — Progress Notes (Signed)
Summary: returning call  Phone Note Call from Patient Call back at Home Phone [phone]   Reason for Call: Talk to Nurse Summary of Call: pt is returning someones call, not sure who called Initial call taken by: ERIN LEVAN,  February 12, 2007 3:01 PM  Follow-up for Phone Call        Spoke with pt- advised I scheduled appt for her ETT 03/30/07 at 11am.   Follow-up by: AMY MARTIN RN,  February 12, 2007 3:08 PM

## 2010-05-11 NOTE — Assessment & Plan Note (Signed)
Summary: med refills/eo   Vital Signs:  Patient Profile:   65 Years Old Female Height:     64 inches (162.56 cm) BP sitting:   189 / 91  (left arm)  Vitals Entered By: Arnette Schaumann RN (December 18, 2007 4:47 PM)                 PCP:  Hortencia Pilar MD  Chief Complaint:  meet new MD and f/u.  History of Present Illness: Pt presents to meet me for the first time and discuss meds, medical problems.  1. BP - very elevated today. Taking cardura two times a day which is not on her med list. Not taking catapress but also not taking lisinopril. States she has never been on a fluid pill. Reports that she has blurry and sometime double vision. Also has fairly frequent headaches. No trouble with speech, gait, or coordination. Denies chest pain or shortness of breath. States that she would be willing to follow-up in 1 week. Pt has not been seen since 5/09 and was supposed to have close follow-up at that visit.  2. diabetes - taking glipizide and lantus 30 units twice  a day per her report. Denies polyphagia or polydipsia. Does report urinary frequency especially at night.  3. 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 Stable as far as the pt knows. Has seen Dr. Erling Cruz concerning this but does not have another appointment that she is aware of.    Past Medical History:    Reviewed history from 09/27/2007 and no changes required:       colon CA Dx 12/23/04       surg 02/25/05 chemo sche x 12       CTs 7/07: no mets, interdigital neuroma right foot, moderatly differntiated adenocarcinoma with, mucinous feautres., s/p 6 mo chemo.       last tx 10-14-05       meningioma diagnosed 11/08       hysterectomy in 77 for bleeding       TMJ surgery 89       bladder tack in 89       G2P2002  Past Surgical History:    Reviewed history from 09/01/2006 and no changes required:       bladder tack -,        cardiac Cath 03/31/04 no sig. CA dz - 04/15/2004,        Cardiolite 11/04-nl, EF 68%, no ischemia -  02/25/2003,        colonoscopy: adenomatous polyp 2003 - 09/10/1999,        EGD 7/98, 7/03 (H.pylori) gastritis -,        ETT 12/20 + - 04/15/2004,        hysterectomy -,        Nissen fundiplication (Q000111Q) - Q000111Q   Family History:    Reviewed history from 04/03/2007 and no changes required:       Daughter healthy., Father with DM, dialysis, HTN, no CAD.  Mom left when patient was 66 yo.  Son commited suicide.       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  No tob, etoh, drugs.; Son commited suicide in April of 2002.  His birthday is in November-pt gets sad these times of year.      Physical Exam  General:  alert and overweight-appearing.  Pleasant.  Eyes:     No corneal or conjunctival inflammation noted. EOMI. Perrla. Funduscopic exam benign, without hemorrhages, exudates or papilledema; discs normal as well. Vision grossly normal. Lungs:     Normal respiratory effort, chest expands symmetrically. Lungs are clear to auscultation, no crackles or wheezes. Heart:     normal rate, regular rhythm, and no murmur.   Abdomen:     soft, non-tender, no masses, and no guarding.   Pulses:     2+ DP and radial pulses Extremities:     trace pitting bilat LE edema Neurologic:     alert & oriented X3, cranial nerves II-XII intact, strength normal in all extremities, finger-to-nose normal, and heel-to-shin normal.  DTRs were not able to be elicited. Skin:     turgor normal, color normal, and no rashes.      Impression & Recommendations:  Problem # 1:  HYPERTENSION, BENIGN SYSTEMIC (ICD-401.1) Assessment: Deteriorated Will add lisinorpil-HCTZ for better control with follow-up in one week. This is on $4 list and pt states she will get it. Advised her to take first dose tonight. Pt agrees. Will need to add cardura to med list once we verify dose.  The following  medications were removed from the medication list:    Catapres 0.1 Mg Tabs (Clonidine hcl) .Marland Kitchen... 1 tab by mouth bid  Her updated medication list for this problem includes:    Lisinopril-hydrochlorothiazide 20-12.5 Mg Tabs (Lisinopril-hydrochlorothiazide) ..... One by mouth daily  Orders: [place]- Est Level  3 [id number])   Problem # 2:  DIABETES MELLITUS II, UNCOMPLICATED ([id number]) Assessment: Improved Last A1C  ~ 12. Still needs optimization but will not push this at this time until BP more stable. Need to confirm lantus dosing. RN Arnette Schaumann gave pt lantus samples and we will try to get pt help with obtaining this medicine. ACE would be indicated given her diabetes. Check Cr in near future. Will need to work to eye exams set up but pt has not been able to afford these in the past.   Her updated medication list for this problem includes:    Bayer Childrens Aspirin 81 Mg Chew (Aspirin) .Marland Kitchen... Take 1 tablet by mouth once a day    Glucotrol 10 Mg Tabs (Glipizide) .Marland Kitchen... Take 1 tablet by mouth twice a day    Lantus 100 Unit/ml Soln (Insulin glargine) ..... Inject 30 units sq bid. refills #3    Lisinopril-hydrochlorothiazide 20-12.5 Mg Tabs (Lisinopril-hydrochlorothiazide) ..... One by mouth daily  Orders: A1C-[facility] [id number]) [place]- Est Level  3 [id number])   Problem # 3:  MENINGIOMA (ICD-225.2) Assessment: Unchanged Stable per report. Will need to make sure she has neurology follow-up. Payment is an issue for the patient.  Complete Medication List: 1)  Ativan 1 Mg Tabs (Lorazepam) .... Take 1 tablet by mouth every twelve hours 2)  Bayer Childrens Aspirin 81 Mg Chew (Aspirin) .... Take 1 tablet by mouth once a day 3)  Glucotrol 10 Mg Tabs (Glipizide) .... Take 1 tablet by mouth twice a day 4)  Lantus 100 Unit/ml Soln (Insulin glargine) .... Inject 30 units sq bid. refills #3 5)  Truetrack Test Strp (Glucose blood) .... Dispense 50 strips; use as directed 6)  Nitroquick 0.4 Mg Subl (Nitroglycerin) .Marland Kitchen..  1 under your tongue every 5 minutes as needed.  if chest pain not better, call 911 7)  Isosorbide Dinitrate 30 Mg Tabs (Isosorbide dinitrate) .Marland Kitchen.. 1 by mouth 4x per day 8)  Lisinopril-hydrochlorothiazide 20-12.5 Mg  Tabs (Lisinopril-hydrochlorothiazide) .... One by mouth daily   Patient Instructions: 1)  Call 911 if you have any worsening vision problems, pass out, or other concerning symptoms. 2)  Be sure to start the new blood pressure medication. You can get it at [facility] for $4. 3)  Schedule a follow-up appointment in 1 week. We need to get your blood pressure under control.   Prescriptions: GLUCOTROL 10 MG TABS (GLIPIZIDE) Take 1 tablet by mouth twice a day  #62 x 3   Entered and Authorized by:   Eugenie Norrie  MD   Signed by:   Eugenie Norrie  MD on 12/18/2007   Method used:   Print then Give to Patient   RxIDPD:[id number] [place] TEST  STRP (GLUCOSE BLOOD) dispense 50 strips; use as directed  #1 x 11   Entered and Authorized by:   Eugenie Norrie  MD   Signed by:   Eugenie Norrie  MD on 12/18/2007   Method used:   Print then Give to Patient   RxIDNE:[id number] LISINOPRIL-HYDROCHLOROTHIAZIDE 20-12.5 MG TABS (LISINOPRIL-HYDROCHLOROTHIAZIDE) one by mouth daily  #31 x 3   Entered and Authorized by:   Eugenie Norrie  MD   Signed by:   Eugenie Norrie  MD on 12/18/2007   Method used:   Print then Give to Patient   RxID:   [id number]  ] Laboratory Results   Blood Tests   Date/Time Received: December 18, 2007 5:28 PM  Date/Time Reported: December 18, 2007 5:35 PM   HGBA1C: 7.8%   (Normal Range: Non-Diabetic - 3-6%   Control Diabetic - 6-8%)  Comments: ...............test performed by......Marland KitchenBonnie A. Martinique, [place] (ASCP)

## 2010-05-11 NOTE — Letter (Signed)
Summary: Generic Letter  [facility]  [address]   [place], [place] [address]   Phone: [phone]  Fax: [phone]    03/19/2008  [facility] [place], [place]  [address]  Dear Ms. Haraway,  I wanted to let you know that your lab tests were normal. Please call our office if you have any questions. Nice to see you the other day.  Sincerely,   Eugenie Norrie  MD [facility]  Appended Document: Generic Letter mailed

## 2010-05-11 NOTE — Letter (Signed)
Summary: Generic Letter  [facility]  [address]   [place], [place] [address]   Phone: [phone]  Fax: [phone]    12/28/2007  [facility] [place], [place]  [address]  Dear Ms. Misner,  I wanted to let you know that all your cholesterol results have come back. Most of the results are good but your "bad" cholesterol is too high. I would like you to set up an appointment in the next week or two to discuss medications and options to treat this. Nice to see you the other day.   Sincerely,     Eugenie Norrie  MD [facility]  Appended Document: Generic Letter Letter sent to pt via mail    ............................DELORES PATE-GADDY,CMA (AAMA)

## 2010-05-11 NOTE — Letter (Signed)
Summary: Generic Letter  [facility]  [address]   [place], [place] [address]   Phone: [phone]  Fax: [phone]    05/06/2008  [facility] [place], [place]  [address]  Dear Ms. Fischman,  Your labwork from 05/05/08 was normal except that your blood sugar was 191. We are still waiting on your urine culture. If there is anything that grows in your culture,we will call and let you know. Please call our office if you have any questions.  Sincerely,   Danise Mina MD [facility]  Appended Document: Generic Letter mailed

## 2010-05-11 NOTE — Assessment & Plan Note (Signed)
Summary: ed for uti thursday. needs f/u   Vital Signs:  Patient Profile:   65 Years Old Female Height:     64 inches (162.56 cm) Weight:      160 pounds (72.73 kg) Temp:     98.2 degrees F (36.78 degrees C) Pulse rate:   75 / minute BP sitting:   180 / 100  Pt. in pain?   yes    Location:   back and lower abd    Intensity:   8  Vitals Entered By: Christen Bame CMA (May 05, 2008 11:08 AM)                  PCP:  Eugenie Norrie  MD  Chief Complaint:  F/u UTI from ED.  History of Present Illness: 65 yo here for FU for UTI diagnosed in ED, Culture showed 50,000 colonies of multiple types of bacteria  Started having urine problems on Tuesday (1 week ago). Was having pain in left arm to fingers every time she had to urinate. Also feeling pain in stomach, vagina, and legs and back pain. Did not check her vagina in the ED. No vaginal discharge. No itching or burning in vaginal area. Felt the same way when her bladder was falling and had to have her bladder "tacked up." Does not remember who did the surgery. Thinks it was a gynecologist. Feels like she has to go very bad. Frequency as well. Not getting better since using cipro that she got from ED. Never had any urine infections before. Does not feel like a yeast infection. No fevers. Pain in back and legs noticed last week. Earache and sorethroat now. Had a cold last week. Sugar was 289 this AM. Some nausea. chronic diarrhea. Sometimes has dark stools. Has been dark recently. Was also given potassium to take in the ED because it was low. Has had hysterectomy before.   Took BP meds this AM. No SOB. See above.     Current Allergies (reviewed today): No known allergies       Physical Exam  General:     alert, NAD  Ears:     R and L ear without erythema, Shiny TM  Mouth:     pharynx pink and moist, no erythema, and no exudates.   Lungs:     normal respiratory effort, normal breath sounds, no crackles, and no wheezes.    Heart:     normal rate, regular rhythm, no murmur, no gallop, and no rub.   Abdomen:     Mild epigastric tendernesssoft, no distention, no masses, no hepatomegaly, and no splenomegaly.   Genitalia:     Question of bladder dropping on genital exam. Blind vaginal pouch. No discharge or reddness noted. Speculum exam performed. Extremities:     Trace edema bilaterally  Neurologic:     alert and oriented    Impression & Recommendations:  Problem # 1:  FREQUENCY, URINARY (ICD-788.41) Assessment: New Diagnosed with UTI in ED based on UA and started on Cipro. Today UA is clear. Will go ahead and culture since only multiple bacterial morphotypes came back on prior UA and patient is still having problems. Could be due to cystocele that seemed present on exam. Will sent to urology for evaluation.  Orders: [place]- Est  Level 4 [id number])   Problem # 2:  HYPERTENSION, BENIGN SYSTEMIC (ICD-401.1) Assessment: Deteriorated Very elevated. Will start Norvasc 10 mg daily. FU with [facility] in 2 weeks. EKG looked unchanged from prior. 73 bpm.  Nonspecific T wave abnromalities that were present on prior EKG. HAs had neg Cardiac workup recently with myoview and has had multiple caths in the past.  Her updated medication list for this problem includes:    Lisinopril-hydrochlorothiazide 20-25 Mg Tabs (Lisinopril-hydrochlorothiazide) ..... One by mouth daily    Carvedilol 25 Mg Tabs (Carvedilol) ..... One by mouth two times a day    Norvasc 10 Mg Tabs (Amlodipine besylate) .Marland Kitchen... Take one tablet by mouth daily for blood pressure   Problem # 3:  HYPOKALEMIA (ICD-276.8) Assessment: Unchanged Was low in Ed. Could be due to HCTZ. Will check again today as patient is now taking potassium.  Orders: [place]- Est  Level 4 [id number])   Problem # 4:  MELENA (ICD-578.1) Assessment: New Will check stool cards x 3. Patient is noting darker stools recently.  Orders: [place]- Est  Level 4 ([id number])   Problem # 5:  ABDOMINAL PAIN  (ICD-789.00) Assessment: New Could be due to hx of gastritis. Will go ahead and check below labs and refer in case this is due to urologic issues.   Orders: Urine Culture-[facility] [id number]) Comp Met-[facility] [id number]) CBC w/Diff-[facility] [id number]) Urology Referral (Urology) [facility]- Est  Level 4 [id number])   Complete Medication List: 1)  Ativan 1 Mg Tabs (Lorazepam) .... Take 1 tablet by mouth every twelve hours 2)  Bayer Childrens Aspirin 81 Mg Chew (Aspirin) .... Take 1 tablet by mouth once a day 3)  Glucotrol 10 Mg Tabs (Glipizide) .... Take 1 tablet by mouth twice a day 4)  Lantus 100 Unit/ml Soln (Insulin glargine) .... Inject 32 units sq bid. dispense one month's supply 5)  Truetrack Test Strp (Glucose blood) .... Dispense 50 strips; use as directed 6)  Nitroquick 0.4 Mg Subl (Nitroglycerin) .Marland Kitchen.. 1 under your tongue every 5 minutes as needed.  if chest pain not better, call 911 7)  Lisinopril-hydrochlorothiazide 20-25 Mg Tabs (Lisinopril-hydrochlorothiazide) .... One by mouth daily 8)  Carvedilol 25 Mg Tabs (Carvedilol) .... One by mouth two times a day 9)  Hydroxyzine Hcl 25 Mg Tabs (Hydroxyzine hcl) .... One by mouth q6 hours as needed itching. 10)  Norvasc 10 Mg Tabs (Amlodipine besylate) .... Take one tablet by mouth daily for blood pressure  Other Orders: Urinalysis-[facility] (00000) EKG- [place] (EKG)   Patient Instructions: 1)  We are checking your potassium today and the cells that look for infection. 2)  I would also like you to do the stool cards - complete 3 and bring them back to [place].  3)  Your EKG shows no change.  4)  I would like for you to go back to see the urologist. 5)  We will follow your culture results.  6)  Follow up in 2 weeks with Dr. Sarita Haver.    Prescriptions: NORVASC 10 MG TABS (AMLODIPINE BESYLATE) Take one tablet by mouth daily for blood pressure  #30 x 3   Entered and Authorized by:   Danise Mina MD   Signed by:   Danise Mina MD on 05/05/2008   Method used:    Faxed to ...       [facility] (retail)       [place], [place]  [address]       Ph: [phone]       Fax: [phone]   RxID:   [id number]   Laboratory Results   Urine Tests  Date/Time Received: May 05, 2008 12:10 PM  Date/Time Reported: May 05, 2008 12:30 PM   Routine Urinalysis   Color: yellow Appearance: Clear Glucose: 100   (Normal Range: Negative) Bilirubin: negative   (Normal Range: Negative) Ketone: negative   (Normal Range: Negative) Spec. Gravity: >=1.030   (Normal Range: 1.003-1.035) Blood: negative   (Normal Range: Negative) pH: 5.5   (Normal Range: 5.0-8.0) Protein: 30   (Normal Range: Negative) Urobilinogen: 0.2   (Normal Range: 0-1) Nitrite: negative   (Normal Range: Negative) Leukocyte Esterace: negative   (Normal Range: Negative)  Urine Microscopic WBC/HPF: 1-5 RBC/HPF: 0-3 Bacteria/HPF: 2+ Mucous/HPF: 2+ Epithelial/HPF: 5-10    Comments: urine sent for culture ...........test performed by...........Marland KitchenHedy Camara, CMA      Appended Document: hemoccult cards report     Lab Visit   Laboratory Results  Date/Time Received: May 13, 2008 Date/Time Reported: May 13, 2008 5:25 PM   Stool - Occult Blood Hemmoccult #1: negative Date: 05/06/2008 Hemoccult #2: negative Date: 05/06/2008 Hemoccult #3: negative Date: 05/08/2008 Comments: ...............test performed by......Marland KitchenBonnie A. Martinique, [place] (ASCP)    Orders Today: Miscellaneous Lab Charge-[facility] [phone]   Appended Document: ed for uti thursday. needs f/u     Clinical Lists Changes  Observations: Added new observation of LAST STL OCC: 05/14/2008 (05/14/2008 9:24) Added new observation of HEMOCULTDUE: Not Indicated (05/14/2008 9:24) Added new observation of HEMOCCULT: normal (05/14/2008 9:24) Added new observation of HDLNXTDUE: [id number] (05/14/2008 9:24) Added new observation of LDLNXTDUE: [id number]  (05/14/2008 9:24) Added new observation of DB FT EX DUE: 12/24/2008 (05/14/2008 9:24)      Last Hemoccult Result: Done. (02/10/2003 12:00:00 AM) Hemoccult Result Date:  05/14/2008 Hemoccult Result:  normal Hemoccult Next Due:  Not Indicated

## 2010-05-11 NOTE — Miscellaneous (Signed)
Summary: [facility] diabetes edu appt   Clinical Lists Changes rec'd fax that pt did not keep her appt at diabtes edu center on 11/10/08.Elige Radon RN  November 11, 2008 10:57 AM

## 2010-05-11 NOTE — Assessment & Plan Note (Signed)
Summary: REMOVAL HOLTER MONITOR [facility]  Nurse Visit        Holter monitor removed, pt will f/u with pcp @ next visit.

## 2010-05-11 NOTE — Progress Notes (Signed)
Summary: triage   Phone Note Call from Patient Call back at [phone]   Caller: Patient Summary of Call: Pt having episodes of falling down and dizziness. Initial call taken by: Raymond Gurney,  January 29, 2010 2:25 PM  Follow-up for Phone Call        Sx started this past Tuesday.  Describes them as dizziness, nausea and difficulty walking (has even fallen a few times).  Did expereince similar symptoms back in March and was diagnosed with vertigo but says that they are worse this time.  Questioned about an hemiparesis or other neurological changes, denied these but does feel like for legs are going to give out when she walks.  Told pt to come in and we would work her in.   Follow-up by: Elray Mcgregor RN,  January 29, 2010 2:39 PM

## 2010-05-11 NOTE — Assessment & Plan Note (Signed)
Summary: FU VISIT [facility]   Vital Signs:  Patient Profile:   65 Years Old Female Height:     64 inches (162.56 cm) Weight:      157.2 pounds (71.45 kg) BMI:     27.08 Temp:     97.9 degrees F (36.61 degrees C) Pulse rate:   60 / minute BP sitting:   122 / 71  (left arm)  Pt. in pain?   no  Vitals Entered By: Dalbert Mayotte (Sep 01, 2006 1:49 PM)              Is Patient Diabetic? Yes    Chief Complaint:  F/U visit.  History of Present Illness: 65 yo Fw ith DM, HTN:  1. HTN: No CP, edema.  c/o palpitations.  Occur p she takes her coreg and atacand.  Occur only at rest.  occasionally gets sweating with them, no nausea, cp or diaphoresis.  Hasn't taken her pulse with these.  Occasionally get SOB with these.  Increased Norvasc to 5mg  daily last visit. home bps 132-152/74-90.  2. Diabetes - Blood sugards 134-437.  None less than 100.  On Glucotrol, Lantus 20u two times a day.  Feels tired and weak.  Normal hgb last visit.       Past Medical History:    Reviewed history from 08/15/2006 and no changes required:       colon CA Dx 12/23/04       surg 02/25/05 chemo sche x 12       CTs 7/07: no mets, interdigital neuroma right foot, moderatly differntiated adenocarcinoma with, mucinous feautres., s/p 6 mo chemo.       last tx 10-14-05  Past Surgical History:    Reviewed history from 06/08/2006 and no changes required:       bladder tack -,        cardiac Cath 03/31/04 no sig. CA dz - 04/15/2004,        Cardiolite 11/04-nl, EF 68%, no ischemia - 02/25/2003,        colonoscopy: adenomatous polyp 2003 - 09/10/1999,        EGD 7/98, 7/03 (H.pylori) gastritis -,        ETT 12/20 + - 04/15/2004,        hysterectomy -,        Nissen fundiplication (Q000111Q) - Q000111Q    Risk Factors:  Tobacco use:  current    Physical Exam  General:     Well-developed,well-nourished,in no acute distress; alert,appropriate and cooperative throughout examination Neck:     No deformities, masses, or  tenderness noted. Lungs:     Normal respiratory effort, chest expands symmetrically. Lungs are clear to auscultation, no crackles or wheezes. Heart:     Normal rate and regular rhythm. S1 and S2 normal without gallop, murmur, click, rub or other extra sounds. Extremities:     No clubbing, cyanosis, edema, or deformity noted with normal full range of motion of all joints.      Impression & Recommendations:  Problem # 1:  HYPERTENSION, BENIGN SYSTEMIC (ICD-401.1) Assessment: Unchanged Will increase norvasc to 10mg  daily.  Continue to moniter home bps.   Orders: [place]- Est  Level 4 [id number])   Problem # 2:  DIABETES MELLITUS II, UNCOMPLICATED ([id number]) Assessment: Deteriorated Will increase Lantus by 1 unit daily.  F/u 2 weeks.   Orders: A1C-[facility] [id number]) [place]- Est  Level 4 [id number])   Problem # 3:  PALPITATIONS (ICD-785.1) Assessment: New Needs holter moniter.  Will have come  back for nurse visit next week.  told to go to er if gets cp with these.  quit smoking. Orders: [place]- Est  Level 4 [id number])    Patient Instructions: 1)  1. Increase Norvasc to 10mg  daily.  Continue taking home blood pressures. 2)  2. Diabetes.  Increase your Lantus by 1 unit daily.  Follow up in two weeks and we will watch your blood sugars.  If you morning sugar is ever less than 100, stop increasing your Lantus.   3)  3. Palpitations.  Holter moniter to look for arrythmias.   Laboratory Results   Blood Tests   Date/Time Recieved: Sep 01, 2006 1:47 PM  Date/Time Reported: Sep 01, 2006 2:00 PM   HGBA1C: 8.4%   (Normal Range: Non-Diabetic - 3-6%   Control Diabetic - 6-8%)  Comments: ...................................................................DONNA LORING  Sep 01, 2006 2:00 PM

## 2010-05-11 NOTE — Progress Notes (Signed)
  Phone Note Outgoing Call   Summary of Call: Called and told patient that she shoudl stop hctz and drink plenty of water.  doing okay.   Initial call taken by: Hortencia Pilar MD,  April 06, 2007 12:29 PM

## 2010-05-11 NOTE — Assessment & Plan Note (Signed)
Summary: f/up,tcb   Vital Signs:  Patient profile:   65 year old female Height:      64 inches Weight:      148 pounds BMI:     25.50 BSA:     1.72 Temp:     98.4 degrees F Pulse rate:   71 / minute BP sitting:   181 / 81  Vitals Entered By: Christen Bame CMA (August 05, 2009 3:28 PM) CC: f/u Is Patient Diabetic? Yes Pain Assessment Patient in pain? no        Primary Care Provider:  Eugenie Norrie  MD  CC:  f/u.  History of Present Illness: 1. dizziness states that this is maybe a little better but still a persistent problem. Doing the BPPV exercises regularly. Has had a couple of falls since I saw but hasn't hurt herself. Just got her medicare to go through. States she can't go to PT because of transportation problems. Notes blurry vision and the sensation that she's going around in a twirl. The meclizine helps (taking it two times a day). Given klonopin at last visit and only takes these if she really really needs them. States they help but make her sleepy.  ROS: some nausea but no vomiting. Decreased appetite. Some problems sleeping due to the dizziness. No problems breathing. No syncope or presyncope.   2. HTN Hypertension adherent to medications: yes but didn't take today side effects from medications:denies subjective: checks BP and occasionally gets 140/80 but other times is "real high."  ROS: chest pain:   no    SOB: no   HA:  occasion    swelling: no  vision changes: no, has occasional blurry vision    also reports itchy eyes and sneezing -- would like something for this.  3. diabetes Just started Lantus 10 units a day (without my knowledge or an rx from me) as it just got approved by MAP. Also on glipizide two times a day. Checking sugars two times a day but not since sunday because she ran out of strips. Eating only once a day due to decreased appetite. Denies any hypoglycemic episodes. Getting numbers mostly in the mid-100s but doesn't have a log book with her  today.  Habits & Providers  Alcohol-Tobacco-Diet     Tobacco Status: never  Current Medications (verified): 1)  Bayer Childrens Aspirin 81 Mg Chew (Aspirin) .... Take 1 Tablet By Mouth Once A Day 2)  Glucotrol 10 Mg Tabs (Glipizide) .... Take 1 Tablet By Mouth Twice A Day 3)  Truetrack Test  Strp (Glucose Blood) .... Dispense 50 Strips; Use As Directed 4)  Nitroquick 0.4 Mg  Subl (Nitroglycerin) .Marland Kitchen.. 1 Under Your Tongue Every 5 Minutes As Needed.  If Chest Pain Not Better, Call 911 5)  Accuretic 20-12.5 Mg Tabs (Quinapril-Hydrochlorothiazide) .... Take 2 Tabs By Mouth Daily 6)  Carvedilol 25 Mg Tabs (Carvedilol) .... One By Mouth Two Times A Day For Blood Pressure and Heart 7)  Klonopin 0.5 Mg Tabs (Clonazepam) .... One By Mouth 2-3 Times A Day For Dizziness 8)  Accuretic 20-12.5 Mg Tabs (Quinapril-Hydrochlorothiazide) .... 2 Tabs By Mouth Daily For Blood Pressure 9)  Amlodipine Besylate 10 Mg Tabs (Amlodipine Besylate) .... One By Mouth Daily 10)  Lantus 100 Unit/ml Soln (Insulin Glargine) .Marland Kitchen.. 10 Units Subcutaneously Daily; Disp 1 Months Supply 11)  Clarinex 5 Mg Tabs (Desloratadine) .... One By Mouth Daily For Allergies  Allergies (verified): No Known Drug Allergies  Review of Systems  review of systems as noted in HPI section   Physical Exam  General:  vitals signs reviewed -- hypertensive but otherwise normal  Neck:  no carotid bruits, no JVD, no tenderness or masses  Lungs:  work of breathing unlabored, clear to auscultation bilaterally; no wheezes, rales, or ronchi; good air movement throughout  Heart:  regular rate and rhythm, no murmurs; normal s1/s2  Neurologic:  alert and oriented ;grossly intact; ambulating without difficulty but reports subjective dizziness on standing; negative Romberg; no pronator drift. normal finger nose.  Dix-Hallpike elicits vertigo but no nystagmus observed. This is transitioned to an Eply maneuver during the visit.   Impression &  Recommendations:  Problem # 1:  BENIGN PAROXYSMAL POSITIONAL VERTIGO (ICD-386.11) Not much better. Now Medicare has been approved. Can't get to PT so will order [facility] PT. continue meclizine and klonipin. Eply done in office today. follow-up in one month.  Orders: Home Health Referral ([place]) [facility]- Est  Level 4 [id number])  Problem # 2:  HYPERTENSION, BENIGN SYSTEMIC (ICD-401.1)  poorly controlled today but hasn't taken medications. Given persistent elevation will add amlodipine. Will have pt call to clarify accuretic dose.  Will try to get BP checks by [facility] and reported to [place] here at [facility].   Her updated medication list for this problem includes:    Accuretic 20-12.5 Mg Tabs (Quinapril-hydrochlorothiazide) .Marland Kitchen... Take 2 tabs by mouth daily    Carvedilol 25 Mg Tabs (Carvedilol) ..... One by mouth two times a day for blood pressure and heart    Accuretic 20-12.5 Mg Tabs (Quinapril-hydrochlorothiazide) .Marland Kitchen... 2 tabs by mouth daily for blood pressure    Amlodipine Besylate 10 Mg Tabs (Amlodipine besylate) ..... One by mouth daily  Orders: [facility]- Est  Level 4 [id number])  Problem # 3:  DIABETES MELLITUS II, UNCOMPLICATED ([id number]) A1c 11.2 in the hospital in March. Surprised to hear that the patient has started taking Lantus (without a prescription from me). Advised increasing to at least meals 3 x per day. Check sugars twice per day. Continue Lantus 10 units daily and glipizide bid.  Has been on Lantus 32 units in the past. follow-up in one month.  Her updated medication list for this problem includes:    Bayer Childrens Aspirin 81 Mg Chew (Aspirin) .Marland Kitchen... Take 1 tablet by mouth once a day    Glucotrol 10 Mg Tabs (Glipizide) .Marland Kitchen... Take 1 tablet by mouth twice a day    Accuretic 20-12.5 Mg Tabs (Quinapril-hydrochlorothiazide) .Marland Kitchen... Take 2 tabs by mouth daily    Accuretic 20-12.5 Mg Tabs (Quinapril-hydrochlorothiazide) .Marland Kitchen... 2 tabs by mouth daily for blood pressure    Lantus 100 Unit/ml Soln (Insulin  glargine) .Marland KitchenMarland KitchenMarland KitchenMarland Kitchen 10 units subcutaneously daily; disp 1 months supply  Orders: [facility]1C-[facility] [id number]) [facility]- Est  Level 4 [id number])  Complete Medication List: 1)  Bayer Childrens Aspirin 81 Mg Chew (Aspirin) .... Take 1 tablet by mouth once a day 2)  Glucotrol 10 Mg Tabs (Glipizide) .... Take 1 tablet by mouth twice a day 3)  Truetrack Test Strp (Glucose blood) .... Dispense 50 strips; use as directed 4)  Nitroquick 0.4 Mg Subl (Nitroglycerin) .Marland Kitchen.. 1 under your tongue every 5 minutes as needed.  if chest pain not better, call 911 5)  Accuretic 20-12.5 Mg Tabs (Quinapril-hydrochlorothiazide) .... Take 2 tabs by mouth daily 6)  Carvedilol 25 Mg Tabs (Carvedilol) .... One by mouth two times a day for blood pressure and heart 7)  Klonopin 0.5 Mg Tabs (Clonazepam) .... One by mouth 2-3 times  a day for dizziness 8)  Accuretic 20-12.5 Mg Tabs (Quinapril-hydrochlorothiazide) .... 2 tabs by mouth daily for blood pressure 9)  Amlodipine Besylate 10 Mg Tabs (Amlodipine besylate) .... One by mouth daily 10)  Lantus 100 Unit/ml Soln (Insulin glargine) .Marland Kitchen.. 10 units subcutaneously daily; disp 1 months supply 11)  Clarinex 5 Mg Tabs (Desloratadine) .... One by mouth daily for allergies  Patient Instructions: 1)  start the amlodipine 10 mg once a day for your blood pressure 2)  call to schedule a mammogram 3)  call us to let us know whoat your accuretic dose is (we have 20/12.5 mg 2 tablets a day) 4)  check your sugars twice a day -- I'm sending the test strips to your pharmacy. 5)  we will arrange for PT to come to your house to help you with your dizziness Prescriptions: CLARINEX 5 MG TABS (DESLORATADINE) one by mouth daily for allergies  #30 x 2   Entered and Authorized by:   Eugenie Norrie  MD   Signed by:   Eugenie Norrie  MD on 08/05/2009   Method used:   Faxed to ...       [facility] (retail)       [place], [place]  [address]       Ph: [phone]       Fax:  [phone]   RxID:   [id number] AMLODIPINE BESYLATE 10 MG TABS (AMLODIPINE BESYLATE) one by mouth daily  #30 x 2   Entered and Authorized by:   Eugenie Norrie  MD   Signed by:   Eugenie Norrie  MD on 08/05/2009   Method used:   Faxed to ...       [facility] (retail)       [place], [place]  [address]       Ph: [phone]       Fax: [phone]   RxID:   [id number] TRUETRACK TEST  STRP (GLUCOSE BLOOD) dispense 50 strips; use as directed  #1 x 300   Entered and Authorized by:   Eugenie Norrie  MD   Signed by:   Eugenie Norrie  MD on 08/05/2009   Method used:   Faxed to ...       [facility] (retail)       [address] [place], [place]  [address]       Ph: [phone]       Fax: [phone]   RxID:   [id number]    Prevention & Chronic Care Immunizations   Influenza vaccine: Not documented    Tetanus booster: 08/09/1996: Done.   Tetanus booster due: 08/10/2006    Pneumococcal vaccine: Not documented    H. zoster vaccine: Not documented  Colorectal Screening   Hemoccult: normal  (05/14/2008)   Hemoccult due: Not Indicated    Colonoscopy: Done.  (12/10/2004)   Colonoscopy due: 12/11/2014  Other Screening   Pap smear: Not documented   Pap smear due: Not Indicated    Mammogram: Normal  (01/17/2008)   Mammogram due: 01/2009    DXA bone density scan: Not documented   Smoking status: never  (08/05/2009)  Diabetes Mellitus   HgbA1C: 7.8  (10/30/2008)   Hemoglobin A1C due: 10/09/2007    Eye exam: Not documented    Foot exam: yes  (10/30/2008)   High risk foot: Not  documented   Foot care education: Not documented   Foot exam due: 12/24/2008    Urine microalbumin/creatinine ratio: Not documented    Diabetes flowsheet reviewed?: Yes   Progress toward A1C goal: Unchanged  Lipids   Total Cholesterol: 149  (10/30/2008)   LDL: 77  (10/30/2008)   LDL Direct: Not  documented   HDL: 46  (10/30/2008)   Triglycerides: 130  (10/30/2008)    SGOT (AST): 13  (09/03/2008)   SGPT (ALT): 12  (09/03/2008)   Alkaline phosphatase: 89  (09/03/2008)   Total bilirubin: 0.7  (09/03/2008)    Lipid flowsheet reviewed?: Yes   Progress toward LDL goal: Unchanged  Hypertension   Last Blood Pressure: 181 / 81  (08/05/2009)   Serum creatinine: 0.87  (10/30/2008)   Serum potassium 3.6  (10/30/2008)    Hypertension flowsheet reviewed?: Yes   Progress toward BP goal: Deteriorated  Self-Management Support :   Personal Goals (by the next clinic visit) :     Personal A1C goal: 8  (06/16/2009)     Personal blood pressure goal: 140/90  (06/16/2009)     Personal LDL goal: 70  (06/16/2009)    Diabetes self-management support: Copy of home glucose meter record, Written self-care plan, Education handout  (08/05/2009)   Diabetes care plan printed   Diabetes education handout printed    Hypertension self-management support: Written self-care plan  (08/05/2009)   Hypertension self-care plan printed.    Lipid self-management support: Written self-care plan  (08/05/2009)   Lipid self-care plan printed.  Appended Document: f/up,tcb     Clinical Lists Changes  Observations: Added new observation of HGBA1C: 11.2 % (06/23/2009 10:11)
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
## 2010-05-11 NOTE — Progress Notes (Signed)
  Phone Note Outgoing Call   Summary of Call: Tried to call patient and tell her that her HBA1C was too high and we need to increase her Lantus.  Have script up at front for her but meanwhile, take 24 units in the morning and 23 units in the evening.  Since her blood sugars have been too high, have her make an appointment to see me in a month instead of 3 months.  Have her bring in her glucose meter.  Tried to call her but didn't get through. Initial call taken by: Hortencia Pilar MD,  July 09, 2007 3:59 PM  Follow-up for Phone Call        lmovm for pt. to call back Follow-up by: Ellamae Lybeck LPN,  March 30, [id number] 4:02 PM  Additional Follow-up for Phone Call Additional follow up Details #1::        Pt. notified, expressed understanding Additional Follow-up by: Enzley Kitchens LPN,  March 30, [id number] 4:09 PM
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
## 2010-05-11 NOTE — Procedures (Signed)
Summary: [place]   Imported By: Raymond Gurney 11/19/2008 16:19:28  _____________________________________________________________________  External Attachment:    Type:   Image     Comment:   External Document

## 2010-05-11 NOTE — Progress Notes (Signed)
Summary: NEED PRESCRIPTIONS  Medications Added ATACAND 32 MG TABS (CANDESARTAN CILEXETIL) Take 1 tablet by mouth once a day ATIVAN 1 MG TABS (LORAZEPAM) Take 1 tablet by mouth every twelve hours BAYER CHILDRENS ASPIRIN 81 MG CHEW (ASPIRIN) Take 1 tablet by mouth once a day COLACE 100 MG CAPS (DOCUSATE SODIUM) Take 1 capsule by mouth twice a day COREG 25 MG TABS (CARVEDILOL) Take 1 tablet by mouth twice a day GLUCOTROL 10 MG TABS (GLIPIZIDE) Take 1 tablet by mouth twice a day HYDROCHLOROTHIAZIDE 12.5 MG TABS (HYDROCHLOROTHIAZIDE) Take 1 tablet by mouth once a day LANTUS 100 UNIT/ML SOLN (INSULIN GLARGINE) Inject 20 unit subcutaneously every twelve hours NORVASC 10 MG TABS (AMLODIPINE BESYLATE) Take 1 tablet once a day ONETOUCH ULTRA TEST  STRP (GLUCOSE BLOOD) Use 1 strip three times a day LISINOPRIL 20 MG  TABS (LISINOPRIL) 1 by mouth daily       Phone Note Other Incoming Call back at [phone]   Call placed by: [facility] @ [place], NURSE CASE MGR Summary of Call: NEED TO SPK W/SOMEONE TO GET PRESCRIPTIONS FOR ALL OF PT'S MEDS.  PT BP & SUGAR ELEVATED.  PT HAS NO MEDS.  TRYING TO HELP HER GET MEDS THROUGH [facility] OR [facility].  NEED TO GET 3 MONTH PRESCRIPTIONS AND/OR ANY SAMPLES TO HELP. NEED RETURN CALL ASAP. Initial call taken by: Benna Dunks,  January 31, 2007 3:07 PM  Follow-up for Phone Call        Spoke with pt- she states she is out of all of her meds - states her blood sugars have been running 170s-180s, and bp 170s/90s.  States she is still under her husbands insurance, but has recently been diagnosed with a brain tumor, and does not want to cannot afford to come to the doctor because of medical bills adding up.  Advised we will be glad to switch her rxs to any pharmacy she would like - to switch to [facility] on [address] and would like the generic of all her meds if possible.  Left a voicemail for pt's case manager as well.  Also was able to schedule pt for f/u with Dr. Hoy Morn  02/07/07 at 2:45pm - pt agreeable to come to appt then.  Advised will ask Dr. Hoy Morn to send her rxs ASAP.   Follow-up by: AMY MARTIN RN,  January 31, 2007 5:13 PM    New/Updated Medications: ATACAND 32 MG TABS (CANDESARTAN CILEXETIL) Take 1 tablet by mouth once a day ATIVAN 1 MG TABS (LORAZEPAM) Take 1 tablet by mouth every twelve hours BAYER CHILDRENS ASPIRIN 81 MG CHEW (ASPIRIN) Take 1 tablet by mouth once a day COLACE 100 MG CAPS (DOCUSATE SODIUM) Take 1 capsule by mouth twice a day COREG 25 MG TABS (CARVEDILOL) Take 1 tablet by mouth twice a day GLUCOTROL 10 MG TABS (GLIPIZIDE) Take 1 tablet by mouth twice a day HYDROCHLOROTHIAZIDE 12.5 MG TABS (HYDROCHLOROTHIAZIDE) Take 1 tablet by mouth once a day LANTUS 100 UNIT/ML SOLN (INSULIN GLARGINE) Inject 20 unit subcutaneously every twelve hours NORVASC 10 MG TABS (AMLODIPINE BESYLATE) Take 1 tablet once a day ONETOUCH ULTRA TEST  STRP (GLUCOSE BLOOD) Use 1 strip three times a day LISINOPRIL 20 MG  TABS (LISINOPRIL) 1 by mouth daily   Prescriptions: LISINOPRIL 20 MG  TABS (LISINOPRIL) 1 by mouth daily  #30 x 0   Entered and Authorized by:   Hortencia Pilar MD   Signed by:   Hortencia Pilar MD on 02/01/2007   Method used:  Electronically sent to ...       [facility]       [address]       [place], [place]  [address]       Ph: [phone]       Fax: [phone]   RxID:   [id number] NORVASC 10 MG TABS (AMLODIPINE BESYLATE) Take 1 tablet once a day  #30 x 0   Entered and Authorized by:   Hortencia Pilar MD   Signed by:   Hortencia Pilar MD on 02/01/2007   Method used:   Electronically sent to ...       [facility]       [address]       [place], [place]  [address]       Ph: [phone]       Fax: [phone]   RxID:   [id number] HYDROCHLOROTHIAZIDE 12.5 MG TABS (HYDROCHLOROTHIAZIDE) Take 1 tablet by mouth once a day  #30 x 0   Entered and Authorized by:    Hortencia Pilar MD   Signed by:   Hortencia Pilar MD on 02/01/2007   Method used:   Electronically sent to ...       [facility]       [address]       [place], [place]  [address]       Ph: [phone]       Fax: [phone]   RxID:   [id number] GLUCOTROL 10 MG TABS (GLIPIZIDE) Take 1 tablet by mouth twice a day  #60 x 0   Entered and Authorized by:   Hortencia Pilar MD   Signed by:   Hortencia Pilar MD on 02/01/2007   Method used:   Electronically sent to ...       [facility]       [address]       [place], [place]  [address]       Ph: [phone]       Fax: [phone]   RxID:   [id number] COREG 25 MG TABS (CARVEDILOL) Take 1 tablet by mouth twice a day  #30 x 0   Entered and Authorized by:   Hortencia Pilar MD   Signed by:   Hortencia Pilar MD on 02/01/2007   Method used:   Electronically sent to ...       [facility]       [address]       [place], [place]  [address]       Ph: [phone]       Fax: [phone]   RxID:   [id number]     Appended Document: NEED PRESCRIPTIONS [name] is requesting to speak with Amy again.  Her phone number is [phone].  Appended Document: NEED PRESCRIPTIONS Spoke with [name]- advised meds have been sent to [facility] - and she can pick up lantus samples for pt here at our office.  [name] states they are going to adopt pt's family  for christmas as an office this year- and will be paying for her med refill and picking up her lantus and deliver to pt.

## 2010-05-11 NOTE — Assessment & Plan Note (Signed)
Summary: F/U [facility]   Vital Signs:  Patient Profile:   65 Years Old Female Height:     64 inches (162.56 cm) Weight:      162 pounds Temp:     98.3 degrees F Pulse rate:   72 / minute BP sitting:   176 / 88  (left arm)  Pt. in pain?   yes    Location:   chest, back, neck and left leg    Intensity:   9    Type:       aching  Vitals Entered ByArnette Schaumann RN (Aug 29, 2007 8:53 AM)                  PCP:  Hortencia Pilar MD  Chief Complaint:  f/u bp and diabetes.  History of Present Illness: 65 yo F with:  HTN: No shortness of breath, chest pain, edema.  does get dizzy when bp too high.  Tolerating medications well.  Knows medication regimen.  bps better p taking medication as low as 114/72 but p meds wear off, she shoots back up to 200s/100s.  2. DM - fasting blood sugars 169-180.  also high during the day 140-200s.  Check feet daily.  still can't afford to see optho.  Dad had DM also.  went on dialysis.         Physical Exam  General:     Well-developed,well-nourished,in no acute distress; alert,appropriate and cooperative throughout examination Lungs:     Normal respiratory effort, chest expands symmetrically. Lungs are clear to auscultation, no crackles or wheezes. Heart:     Normal rate and regular rhythm. S1 and S2 normal without gallop, murmur, click, rub or other extra sounds. Extremities:     no edema.    Impression & Recommendations:  Problem # 1:  HYPERTENSION, BENIGN SYSTEMIC (ICD-401.1) clonidine does not seem to be working for her -- too variable.  will switch back to her old regimen but have to do it carefully.  d/w SM fellow and she agreed with plan.  Will have patient under close follow up for awhile until we get this sorted out.   Her updated medication list for this problem includes:    Catapres 0.1 Mg Tabs (Clonidine hcl) .Marland Kitchen... 1 tab by mouth bid    Lisinopril 40 Mg Tabs (Lisinopril) .Marland Kitchen... 1 by mouth daily  Orders: Basic Met-[facility]  [id number]) [place]- Est Level  3 [id number])   Problem # 2:  DIABETES MELLITUS II, UNCOMPLICATED ([id number]) will increase her lantus.  could probably go up by 8 but want to be careful and avoid hypoglycemia so will only increase by 4 overall and have patient call me with her blood sugars if she has problems.  otherwise, will check in late next week. Her updated medication list for this problem includes:    Bayer Childrens Aspirin 81 Mg Chew (Aspirin) .Marland Kitchen... Take 1 tablet by mouth once a day    Glucotrol 10 Mg Tabs (Glipizide) .Marland Kitchen... Take 1 tablet by mouth twice a day    Lantus 100 Unit/ml Soln (Insulin glargine) ..... Inject 24 units in the morning and 23 units in the evening  dispense # qs 3 months.  refills #3    Lisinopril 40 Mg Tabs (Lisinopril) .Marland Kitchen... 1 by mouth daily  Orders: [place]- Est Level  3 [id number])   Complete Medication List: 1)  Ativan 1 Mg Tabs (Lorazepam) .... Take 1 tablet by mouth every twelve hours 2)  Bayer Childrens Aspirin 81 Mg  Chew (Aspirin) .... Take 1 tablet by mouth once a day 3)  Glucotrol 10 Mg Tabs (Glipizide) .... Take 1 tablet by mouth twice a day 4)  Lantus 100 Unit/ml Soln (Insulin glargine) .... Inject 24 units in the morning and 23 units in the evening  dispense # qs 3 months.  refills #3 5)  Onetouch Ultra Test Strp (Glucose blood) .... Use 1 strip three times a day 6)  Nitroquick 0.4 Mg Subl (Nitroglycerin) .Marland Kitchen.. 1 under your tongue every 5 minutes as needed.  if chest pain not better, call 911 7)  Isosorbide Dinitrate 30 Mg Tabs (Isosorbide dinitrate) .Marland Kitchen.. 1 by mouth 4x per day 8)  Catapres 0.1 Mg Tabs (Clonidine hcl) .Marland Kitchen.. 1 tab by mouth bid 9)  Lisinopril 40 Mg Tabs (Lisinopril) .Marland Kitchen.. 1 by mouth daily   Patient Instructions: 1)  Blood pressure - Start taking your Lisinopril again (40mg ).  On Monday, Decrease your Clonidine to once daily.  Clonidine is a good medicine but after it wears off, it actually drives your blood pressure higher.  I don't think this is  the right medicine for you.  Do NOT stop the Clonidine totally - your blood pressure will be too high.  Make an appointment to see me later next week. 2)  Blood sugar - Increase your Lantus to 27 units in the morning and in the evening. 3)  **addendum: wrote on her paper for patient to take a half pill of lisinopril instead since her bp does get to [id number] systolic.     Prescriptions: LISINOPRIL 40 MG  TABS (LISINOPRIL) 1 by mouth daily  #30 x 2   Entered and Authorized by:   Hortencia Pilar MD   Signed by:   Hortencia Pilar MD on 08/29/2007   Method used:   Print then Give to Patient   RxID:   [id number]  ]

## 2010-05-11 NOTE — Procedures (Signed)
Summary: Gastroenterology Col  Gastroenterology Col   Imported By: June McMurray Eaton 07/25/2007 13:23:33  _____________________________________________________________________  External Attachment:    Type:   Image     Comment:   External Document

## 2010-05-11 NOTE — Assessment & Plan Note (Signed)
Summary: F/U [facility]   Vital Signs:  Patient Profile:   65 Years Old Female Height:     64 inches Weight:      150 pounds BMI:     25.84 BSA:     1.73 Temp:     98.8 degrees F Pulse rate:   60 / minute BP sitting:   150 / 75  Pt. in pain?   yes    Location:   head    Intensity:   9  Vitals Entered By: Elige Radon RN (Aug 15, 2006 1:44 PM)              Is Patient Diabetic? Yes    Chief Complaint:  f/u .  History of Present Illness: 66 yo F c HTN, colon ca, c/o  1. HTN: High bps at home and sometimes feels dizzy with them.  No headaches.  No edema.  Some chest pain after eating big dinners and lying donw - burning, her typical reflux.  some sOB with walking.  c/o feeling a little weaker lately.  2. Colon cancer - pt had been having bright red blood in her stools, dark tarry stools.  she says she told this to her onc doctor but they didn't do anything.  recent CT with density on liver  Has abdominal pain with every BM.  Her stools are loose all the time since her colon rescetion and she has 4-5/day.       Past Medical History:    Reviewed history from 07/14/2006 and no changes required:       colon CA Dx 12/23/04       surg 02/25/05 chemo sche x 12       CTs 7/07: no mets, interdigital neuroma right foot, moderatly differntiated adenocarcinoma with, mucinous feautres., s/p 6 mo chemo.       last tx 10-14-05   Family History:    Reviewed history from 06/08/2006 and no changes required:       Daughter healthy., Father with DM, dialysis, HTN, no CAD.  Mom left, when patient was 65 yo.  Son commited suicide.  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  No tob, etoh, drugs.; Son commited suicide in April of 2002.  His birthday is in November-pt gets sad these times of year.     Physical Exam  General:     Well-developed,well-nourished,in no acute distress; alert,appropriate and  cooperative throughout examination Neck:     No deformities, masses, or tenderness noted. Chest Wall:     No deformities, masses, or tenderness noted. Lungs:     Normal respiratory effort, chest expands symmetrically. Lungs are clear to auscultation, no crackles or wheezes. Heart:     Normal rate and regular rhythm. S1 and S2 normal without gallop, murmur, click, rub or other extra sounds. Abdomen:     Bowel sounds positive,abdomen soft and without masses, organomegaly or hernias noted.  Some tenderness bilatearl lower quadrants. Extremities:     No clubbing, cyanosis, edema, or deformity noted with normal full range of motion of all joints.      Impression & Recommendations:  Problem # 1:  HYPERTENSION, BENIGN SYSTEMIC (ICD-401.1) bp not well controlled.  will add norvasc 5mg  daily and recheck in 2 weeks.   Orders: [place]- Est  Level 4 [id number])   Problem # 2:  COLON CANCER (ICD-153.9) c/o  increase in weakness.  wil check CBC.  with blood in stool, will send  a note to her onc doctor.  Hemeoccult negative in the office today.   Orders: CBC-[facility] [id number]) [place]- Est  Level 4 [id number])   Problem # 3:  HEADACHE (ICD-784.0) 9/10 get occasional headaches.   Orders: Promethazine up to 50mg  (J2550) Morphine 1 mg (J2270) [place]- Est  Level 4 [id number])  Orders: Promethazine up to 50mg  (J2550) Morphine 10 mg (J2270) [place]- Est  Level 4 [id number])   Problem # 4:  ANEMIA, IRON DEFICIENCY, UNSPEC. (ICD-280.9) Will check CBC in concern for weakness.    Medication Administration  Injection # 1:    Medication: Morphine 10 mg    Diagnosis: HEADACHE (ICD-784.0)    Route: IM    Site: L deltoid    Exp Date: 02/10/2008    Lot #: [id number]    Mfr: [email or web address]    Patient tolerated injection without complications    Given by: Elige Radon RN (Aug 15, 2006 3:10 PM)  Injection # 2:    Medication: Promethazine up to 50mg     Diagnosis: HEADACHE (ICD-784.0)    Route: IM    Site: R deltoid    Exp Date:  03/10/2008    Lot #: [id number]    Mfr: [email or web address]    Patient tolerated injection without complications    Given by: Elige Radon RN (Aug 15, 2006 3:13 PM)  Orders Added: 1)  CBC-[facility] [id number] 2)  Promethazine up to 50mg  [J2550] 3)  Morphine 10 mg [J2270] 4)  [place]- Est  Level 4 [id number]   Laboratory Results   Blood Tests   Date/Time Recieved: Aug 15, 2006 2:42 PM  Date/Time Reported: Aug 15, 2006 3:59 PM    CBC WBC:  9.0   (Normal Range: 4.5-11.0) RBC:  4.53   (Normal Range 4.20-5.40) HGB:  13.5 g/dL   (Normal Range: 13.0-17.0 in Males, 12.0-15.0 in Females) Hct:  39.2 %   (Normal Range: 36.0-46.0) MCV:  86.5   (Normal Range: 80.0-100.0) Plt.:  240   (Normal Range: 150-450) Comments: ...................................................................DONNA [facility]  Aug 15, 2006 3:59 PM

## 2010-05-11 NOTE — Assessment & Plan Note (Signed)
Summary: f/u/kh   Vital Signs:  Patient profile:   65 year old female Height:      64 inches Weight:      153 pounds BMI:     26.36 BSA:     1.75 Temp:     97.6 degrees F Pulse rate:   53 / minute BP sitting:   144 / 80  (left arm)  Vitals Entered By: Christen Bame CMA (Sep 03, 2009 4:15 PM) CC: f/u BP Is Patient Diabetic? Yes Did you bring your meter with you today? No Pain Assessment Patient in pain? yes     Location: head Intensity: 7   Primary Care Provider:  Eugenie Norrie  MD  CC:  f/u BP.  History of Present Illness: 1. dizziness states that this is improved. Still dizzy at times but much better. Donig the Epley maneuvers at home. PT has been out to work on balance but she's not sure this has done much good. Taking the meclizine and occasionally klonipin but this makes her quite sleepy. Has not fallen since our last visit.   ROS: some nausea but no vomiting. Decreased appetite. Still having some sleep diffifulty. No problems breathing. No syncope or presyncope.   2. HTN Hypertension adherent to medications: yes but didn't take today side effects from medications:denies subjective: states that BPs have been up and down. Doesn't take the norvasc every day because her blood pressure gets down to around 123XX123 systolic. Pt denies any symptoms when BP is that low, just thinks the number is too low.   ROS: chest pain:   no    SOB: no   HA:  occasionally   swelling: no  vision changes  3. Diabetes taking medications: Lantus 10 units bid and glipizide problems with medications?: no blood sugar testing frequency: two times a day  hypoglycemic events?: no subjective: feels like sugars are variable. Numbers don't seem to correlate with how much she's eating.  polyuria:  yes   polydipsia: yes    problems with feet:   sugar log readings for past several days in reverse chronology: 172 /  185 / 318 / 176 / 256 / 172 / 244 / 228 / 180 / 130 / 245 / 244 / 164 /  243  Habits & Providers  Alcohol-Tobacco-Diet     Tobacco Status: never  Current Medications (verified): 1)  Bayer Childrens Aspirin 81 Mg Chew (Aspirin) .... Take 1 Tablet By Mouth Once A Day 2)  Glucotrol 10 Mg Tabs (Glipizide) .... Take 1 Tablet By Mouth Twice A Day 3)  Truetrack Test  Strp (Glucose Blood) .... Dispense 50 Strips; Use As Directed 4)  Nitroquick 0.4 Mg  Subl (Nitroglycerin) .Marland Kitchen.. 1 Under Your Tongue Every 5 Minutes As Needed.  If Chest Pain Not Better, Call 911 5)  Accuretic 20-12.5 Mg Tabs (Quinapril-Hydrochlorothiazide) .... Take 2 Tabs By Mouth Daily 6)  Carvedilol 25 Mg Tabs (Carvedilol) .... One By Mouth Two Times A Day For Blood Pressure and Heart 7)  Klonopin 0.5 Mg Tabs (Clonazepam) .... One By Mouth 2-3 Times A Day For Dizziness 8)  Accuretic 20-12.5 Mg Tabs (Quinapril-Hydrochlorothiazide) .... 2 Tabs By Mouth Daily For Blood Pressure 9)  Amlodipine Besylate 10 Mg Tabs (Amlodipine Besylate) .... One By Mouth Daily 10)  Lantus 100 Unit/ml Soln (Insulin Glargine) .Marland Kitchen.. 10 Units Subcutaneously Twice A Day;; Disp 1 Months Supply 11)  Clarinex 5 Mg Tabs (Desloratadine) .... One By Mouth Daily For Allergies  Allergies (verified): No Known  Drug Allergies  Review of Systems       review of systems as noted in HPI section   Physical Exam  General:  vitals signs reviewed -- hypertensive but otherwise normal  Neck:  no carotid bruits, no tenderness or masses  Lungs:  work of breathing unlabored, clear to auscultation bilaterally; no wheezes, rales, or ronchi; good air movement throughout  Heart:  regular rate and rhythm, no murmurs; normal s1/s2  Neurologic:  alert and oriented. speech normal. station and gait normal. no gross deficitis. transfers easily, bends over and stands up without problems.   Impression & Recommendations:  Problem # 1:  BENIGN PAROXYSMAL POSITIONAL VERTIGO (ICD-386.11) Assessment Improved  better. continue at-home Epley maneuvers. PT has  been ordered for balance and I think this may have more benefit than the pt knows. No falls. Her updated medication list for this problem includes:    Clarinex 5 Mg Tabs (Desloratadine) ..... One by mouth daily for allergies  Orders: [place]- Est  Level 4 [id number])  Problem # 2:  HYPERTENSION, BENIGN SYSTEMIC (ICD-401.1) Assessment: Improved Explained a normal BP to the patient. She seems to understand. No symptoms of orthostasis. Continue amlodipine along with other meds. Has been difficult to control. Pt reports no problems remembering meds.   Her updated medication list for this problem includes:    Accuretic 20-12.5 Mg Tabs (Quinapril-hydrochlorothiazide) .Marland Kitchen... Take 2 tabs by mouth daily    Carvedilol 25 Mg Tabs (Carvedilol) ..... One by mouth two times a day for blood pressure and heart    Accuretic 20-12.5 Mg Tabs (Quinapril-hydrochlorothiazide) .Marland Kitchen... 2 tabs by mouth daily for blood pressure    Amlodipine Besylate 10 Mg Tabs (Amlodipine besylate) ..... One by mouth daily  Problem # 3:  DIABETIC PERIPHERAL NEUROPATHY (ICD-250.60) Assessment: Unchanged Deteriorating control per A1c in the hospital in March. Will increase lantus to 12 units two times a day given suboptimal control per pt glucose readings. follow-up in pharm clinic soon.  I am still unclear why pt had such good control in July 2010 off lantus and now has poor control have she restarted it on her own. Consider restarting metformin (pt d/c'd due to GI side effects).   Her updated medication list for this problem includes:    Bayer Childrens Aspirin 81 Mg Chew (Aspirin) .Marland Kitchen... Take 1 tablet by mouth once a day    Glucotrol 10 Mg Tabs (Glipizide) .Marland Kitchen... Take 1 tablet by mouth twice a day    Accuretic 20-12.5 Mg Tabs (Quinapril-hydrochlorothiazide) .Marland Kitchen... Take 2 tabs by mouth daily    Accuretic 20-12.5 Mg Tabs (Quinapril-hydrochlorothiazide) .Marland Kitchen... 2 tabs by mouth daily for blood pressure    Lantus 100 Unit/ml Soln (Insulin glargine)  .Marland KitchenMarland KitchenMarland KitchenMarland Kitchen 10 units subcutaneously twice a day;; disp 1 months supply  Labs Reviewed: Creat: 0.87 (10/30/2008)    Reviewed HgBA1c results: 11.2 (06/23/2009)  7.8 (10/30/2008)  Complete Medication List: 1)  Bayer Childrens Aspirin 81 Mg Chew (Aspirin) .... Take 1 tablet by mouth once a day 2)  Glucotrol 10 Mg Tabs (Glipizide) .... Take 1 tablet by mouth twice a day 3)  Truetrack Test Strp (Glucose blood) .... Dispense 50 strips; use as directed 4)  Nitroquick 0.4 Mg Subl (Nitroglycerin) .Marland Kitchen.. 1 under your tongue every 5 minutes as needed.  if chest pain not better, call 911 5)  Accuretic 20-12.5 Mg Tabs (Quinapril-hydrochlorothiazide) .... Take 2 tabs by mouth daily 6)  Carvedilol 25 Mg Tabs (Carvedilol) .... One by mouth two times a day for blood  pressure and heart 7)  Klonopin 0.5 Mg Tabs (Clonazepam) .... One by mouth 2-3 times a day for dizziness 8)  Accuretic 20-12.5 Mg Tabs (Quinapril-hydrochlorothiazide) .... 2 tabs by mouth daily for blood pressure 9)  Amlodipine Besylate 10 Mg Tabs (Amlodipine besylate) .... One by mouth daily 10)  Lantus 100 Unit/ml Soln (Insulin glargine) .Marland Kitchen.. 10 units subcutaneously twice a day;; disp 1 months supply 11)  Clarinex 5 Mg Tabs (Desloratadine) .... One by mouth daily for allergies  Other Orders: Comp Met-[facility] [phone]) CBC-[facility] [id number])  Patient Instructions: 1)  increase your lantus to 12 units two times a day; remember to eat small frequent meals 2)  keep taking the norvasc every day (blood pressures around 110 for the top number are good as long as you feel okay) 3)  follow-up in our pharmacy clinic in the next month to talk more about your diabetes. Keep recording your sugars, this is very helpful. 4)  follow-up here in 2 months.   Prevention & Chronic Care Immunizations   Influenza vaccine: Not documented    Tetanus booster: 08/09/1996: Done.   Tetanus booster due: 08/10/2006    Pneumococcal vaccine: Not documented    H. zoster vaccine:  Not documented  Colorectal Screening   Hemoccult: normal  (05/14/2008)   Hemoccult due: Not Indicated    Colonoscopy: Done.  (12/10/2004)   Colonoscopy due: 12/11/2014  Other Screening   Pap smear: Not documented   Pap smear due: Not Indicated    Mammogram: Normal  (01/17/2008)   Mammogram due: 01/2009    DXA bone density scan: Not documented   Smoking status: never  (09/03/2009)  Diabetes Mellitus   HgbA1C: 11.2  (06/23/2009)   Hemoglobin A1C due: 10/09/2007    Eye exam: Not documented    Foot exam: yes  (10/30/2008)   High risk foot: Not documented   Foot care education: Not documented   Foot exam due: 12/24/2008    Urine microalbumin/creatinine ratio: Not documented    Diabetes flowsheet reviewed?: Yes   Progress toward A1C goal: Unchanged  Lipids   Total Cholesterol: 149  (10/30/2008)   LDL: 77  (10/30/2008)   LDL Direct: Not documented   HDL: 46  (10/30/2008)   Triglycerides: 130  (10/30/2008)    SGOT (AST): 13  (09/03/2008)   SGPT (ALT): 12  (09/03/2008) CMP ordered    Alkaline phosphatase: 89  (09/03/2008)   Total bilirubin: 0.7  (09/03/2008)    Lipid flowsheet reviewed?: Yes   Progress toward LDL goal: Unchanged  Hypertension   Last Blood Pressure: 144 / 80  (09/03/2009)   Serum creatinine: 0.87  (10/30/2008)   Serum potassium 3.6  (10/30/2008) CMP ordered     Hypertension flowsheet reviewed?: Yes   Progress toward BP goal: Improved  Self-Management Support :   Personal Goals (by the next clinic visit) :     Personal A1C goal: 8  (06/16/2009)     Personal blood pressure goal: 140/90  (06/16/2009)     Personal LDL goal: 70  (06/16/2009)    Diabetes self-management support: Copy of home glucose meter record, Written self-care plan, Education handout  (08/05/2009)    Hypertension self-management support: Written self-care plan  (08/05/2009)    Lipid self-management support: Written self-care plan  (08/05/2009)
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
## 2010-05-13 NOTE — Consult Note (Signed)
Summary: [facility]  [facility]   Imported By: Audie Clear 04/26/2010 13:54:35  _____________________________________________________________________  External Attachment:    Type:   Image     Comment:   External Document

## 2010-06-23 LAB — DIFFERENTIAL
Basophils Absolute: 0 10*3/uL (ref 0.0–0.1)
Basophils Relative: 0 % (ref 0–1)
Eosinophils Absolute: 0.1 10*3/uL (ref 0.0–0.7)
Eosinophils Relative: 1 % (ref 0–5)
Lymphocytes Relative: 31 % (ref 12–46)
Lymphs Abs: 3.2 10*3/uL (ref 0.7–4.0)
Monocytes Absolute: 0.7 10*3/uL (ref 0.1–1.0)
Monocytes Relative: 7 % (ref 3–12)
Neutro Abs: 6.4 10*3/uL (ref 1.7–7.7)
Neutrophils Relative %: 61 % (ref 43–77)

## 2010-06-23 LAB — BASIC METABOLIC PANEL
BUN: 19 mg/dL (ref 6–23)
CO2: 24 mEq/L (ref 19–32)
Calcium: 9.2 mg/dL (ref 8.4–10.5)
Chloride: 104 mEq/L (ref 96–112)
Creatinine, Ser: 1.37 mg/dL — ABNORMAL HIGH (ref 0.4–1.2)
GFR calc Af Amer: 47 mL/min — ABNORMAL LOW (ref >60)
GFR calc non Af Amer: 39 mL/min — ABNORMAL LOW (ref >60)
Glucose, Bld: 448 mg/dL — ABNORMAL HIGH (ref 70–99)
Potassium: 3.5 mEq/L (ref 3.5–5.1)
Sodium: 137 mEq/L (ref 135–145)

## 2010-06-23 LAB — CBC
HCT: 37.8 % (ref 36.0–46.0)
Hemoglobin: 12.5 g/dL (ref 12.0–15.0)
MCH: 28.8 pg (ref 26.0–34.0)
MCHC: 33.1 g/dL (ref 30.0–36.0)
MCV: 87.1 fL (ref 78.0–100.0)
Platelets: 235 10*3/uL (ref 150–400)
RBC: 4.34 MIL/uL (ref 3.87–5.11)
RDW: 13 % (ref 11.5–15.5)
WBC: 10.4 10*3/uL (ref 4.0–10.5)

## 2010-06-23 LAB — GLUCOSE, CAPILLARY
Glucose-Capillary: 291 mg/dL — ABNORMAL HIGH (ref 70–99)
Glucose-Capillary: 400 mg/dL — ABNORMAL HIGH (ref 70–99)

## 2010-07-05 LAB — BASIC METABOLIC PANEL
BUN: 16 mg/dL (ref 6–23)
BUN: 20 mg/dL (ref 6–23)
CO2: 24 mEq/L (ref 19–32)
CO2: 31 mEq/L (ref 19–32)
Calcium: 9 mg/dL (ref 8.4–10.5)
Calcium: 9.6 mg/dL (ref 8.4–10.5)
Chloride: 101 mEq/L (ref 96–112)
Chloride: 97 mEq/L (ref 96–112)
Creatinine, Ser: 0.75 mg/dL (ref 0.4–1.2)
Creatinine, Ser: 0.9 mg/dL (ref 0.4–1.2)
GFR calc Af Amer: >60 mL/min (ref >60)
GFR calc Af Amer: >60 mL/min (ref >60)
GFR calc non Af Amer: >60 mL/min (ref >60)
GFR calc non Af Amer: >60 mL/min (ref >60)
Glucose, Bld: 254 mg/dL — ABNORMAL HIGH (ref 70–99)
Glucose, Bld: 331 mg/dL — ABNORMAL HIGH (ref 70–99)
Potassium: 2.9 mEq/L — ABNORMAL LOW (ref 3.5–5.1)
Potassium: 4 mEq/L (ref 3.5–5.1)
Sodium: 134 mEq/L — ABNORMAL LOW (ref 135–145)
Sodium: 138 mEq/L (ref 135–145)

## 2010-07-05 LAB — GLUCOSE, CAPILLARY
Glucose-Capillary: 153 mg/dL — ABNORMAL HIGH (ref 70–99)
Glucose-Capillary: 161 mg/dL — ABNORMAL HIGH (ref 70–99)
Glucose-Capillary: 175 mg/dL — ABNORMAL HIGH (ref 70–99)
Glucose-Capillary: 212 mg/dL — ABNORMAL HIGH (ref 70–99)
Glucose-Capillary: 213 mg/dL — ABNORMAL HIGH (ref 70–99)
Glucose-Capillary: 288 mg/dL — ABNORMAL HIGH (ref 70–99)
Glucose-Capillary: 313 mg/dL — ABNORMAL HIGH (ref 70–99)
Glucose-Capillary: 314 mg/dL — ABNORMAL HIGH (ref 70–99)

## 2010-07-05 LAB — URINE MICROSCOPIC-ADD ON

## 2010-07-05 LAB — CARDIAC PANEL(CRET KIN+CKTOT+MB+TROPI)
CK, MB: 1.4 ng/mL (ref 0.3–4.0)
Relative Index: INVALID (ref 0.0–2.5)
Total CK: 80 U/L (ref 7–177)
Troponin I: 0.04 ng/mL (ref 0.00–0.06)

## 2010-07-05 LAB — HEMOGLOBIN A1C
Hgb A1c MFr Bld: 11.2 % — ABNORMAL HIGH (ref 4.6–6.1)
Mean Plasma Glucose: 275 mg/dL

## 2010-07-05 LAB — POCT CARDIAC MARKERS
CKMB, poc: <1 ng/mL — ABNORMAL LOW (ref 1.0–8.0)
Myoglobin, poc: 77.9 ng/mL (ref 12–200)
Troponin i, poc: <0.05 ng/mL (ref 0.00–0.09)

## 2010-07-05 LAB — CBC
HCT: 38 % (ref 36.0–46.0)
Hemoglobin: 13.3 g/dL (ref 12.0–15.0)
MCHC: 34.9 g/dL (ref 30.0–36.0)
MCV: 86.3 fL (ref 78.0–100.0)
Platelets: 218 10*3/uL (ref 150–400)
RBC: 4.41 MIL/uL (ref 3.87–5.11)
RDW: 12.6 % (ref 11.5–15.5)
WBC: 7.7 10*3/uL (ref 4.0–10.5)

## 2010-07-05 LAB — URINALYSIS, ROUTINE W REFLEX MICROSCOPIC
Bilirubin Urine: NEGATIVE
Glucose, UA: >1000 mg/dL — AB
Hgb urine dipstick: NEGATIVE
Ketones, ur: NEGATIVE mg/dL
Leukocytes, UA: NEGATIVE
Nitrite: NEGATIVE
Protein, ur: NEGATIVE mg/dL
Specific Gravity, Urine: 1.02 (ref 1.005–1.030)
Urobilinogen, UA: 0.2 mg/dL (ref 0.0–1.0)
pH: 5 (ref 5.0–8.0)

## 2010-07-05 LAB — DIFFERENTIAL
Basophils Absolute: 0 10*3/uL (ref 0.0–0.1)
Basophils Relative: 0 % (ref 0–1)
Eosinophils Absolute: 0.1 10*3/uL (ref 0.0–0.7)
Eosinophils Relative: 1 % (ref 0–5)
Lymphocytes Relative: 28 % (ref 12–46)
Lymphs Abs: 2.2 10*3/uL (ref 0.7–4.0)
Monocytes Absolute: 0.5 10*3/uL (ref 0.1–1.0)
Monocytes Relative: 7 % (ref 3–12)
Neutro Abs: 4.9 10*3/uL (ref 1.7–7.7)
Neutrophils Relative %: 64 % (ref 43–77)

## 2010-07-05 LAB — CK TOTAL AND CKMB (NOT AT ARMC)
CK, MB: 1.3 ng/mL (ref 0.3–4.0)
Relative Index: INVALID (ref 0.0–2.5)
Total CK: 77 U/L (ref 7–177)

## 2010-07-05 LAB — TSH: TSH: 1.392 u[IU]/mL (ref 0.350–4.500)

## 2010-07-05 LAB — URINE CULTURE: Colony Count: 15000

## 2010-07-05 LAB — TROPONIN I: Troponin I: 0.04 ng/mL (ref 0.00–0.06)

## 2010-07-12 LAB — CREATININE, SERUM
Creatinine, Ser: 0.91 mg/dL (ref 0.4–1.2)
GFR calc Af Amer: >60 mL/min (ref >60)
GFR calc non Af Amer: >60 mL/min (ref >60)

## 2010-07-12 LAB — RAPID STREP SCREEN (MED CTR MEBANE ONLY): Streptococcus, Group A Screen (Direct): NEGATIVE

## 2010-07-20 LAB — URINALYSIS, ROUTINE W REFLEX MICROSCOPIC
Bilirubin Urine: NEGATIVE
Glucose, UA: NEGATIVE mg/dL
Hgb urine dipstick: NEGATIVE
Ketones, ur: NEGATIVE mg/dL
Nitrite: NEGATIVE
Protein, ur: NEGATIVE mg/dL
Specific Gravity, Urine: 1.012 (ref 1.005–1.030)
Urobilinogen, UA: 0.2 mg/dL (ref 0.0–1.0)
pH: 6 (ref 5.0–8.0)

## 2010-07-20 LAB — BASIC METABOLIC PANEL
BUN: 14 mg/dL (ref 6–23)
CO2: 27 mEq/L (ref 19–32)
Calcium: 9.2 mg/dL (ref 8.4–10.5)
Chloride: 103 mEq/L (ref 96–112)
Creatinine, Ser: 0.72 mg/dL (ref 0.4–1.2)
GFR calc Af Amer: >60 mL/min (ref >60)
GFR calc non Af Amer: >60 mL/min (ref >60)
Glucose, Bld: 132 mg/dL — ABNORMAL HIGH (ref 70–99)
Potassium: 3.8 mEq/L (ref 3.5–5.1)
Sodium: 137 mEq/L (ref 135–145)

## 2010-07-20 LAB — DIFFERENTIAL
Basophils Absolute: 0.1 10*3/uL (ref 0.0–0.1)
Basophils Relative: 1 % (ref 0–1)
Eosinophils Absolute: 0.1 10*3/uL (ref 0.0–0.7)
Eosinophils Relative: 1 % (ref 0–5)
Lymphocytes Relative: 38 % (ref 12–46)
Lymphs Abs: 3.1 10*3/uL (ref 0.7–4.0)
Monocytes Absolute: 0.5 10*3/uL (ref 0.1–1.0)
Monocytes Relative: 6 % (ref 3–12)
Neutro Abs: 4.3 10*3/uL (ref 1.7–7.7)
Neutrophils Relative %: 54 % (ref 43–77)

## 2010-07-20 LAB — CBC
HCT: 39 % (ref 36.0–46.0)
Hemoglobin: 13.1 g/dL (ref 12.0–15.0)
MCHC: 33.5 g/dL (ref 30.0–36.0)
MCV: 87 fL (ref 78.0–100.0)
Platelets: 228 10*3/uL (ref 150–400)
RBC: 4.49 MIL/uL (ref 3.87–5.11)
RDW: 13.4 % (ref 11.5–15.5)
WBC: 8 10*3/uL (ref 4.0–10.5)

## 2010-07-20 LAB — BRAIN NATRIURETIC PEPTIDE: Pro B Natriuretic peptide (BNP): 94 pg/mL (ref 0.0–100.0)

## 2010-07-20 LAB — GLUCOSE, CAPILLARY: Glucose-Capillary: 109 mg/dL — ABNORMAL HIGH (ref 70–99)

## 2010-07-22 LAB — GLUCOSE, CAPILLARY: Glucose-Capillary: 314 mg/dL — ABNORMAL HIGH (ref 70–99)

## 2010-07-26 LAB — CBC
HCT: 38.8 % (ref 36.0–46.0)
Hemoglobin: 13 g/dL (ref 12.0–15.0)
MCHC: 33.5 g/dL (ref 30.0–36.0)
MCV: 86.3 fL (ref 78.0–100.0)
Platelets: 248 10*3/uL (ref 150–400)
RBC: 4.5 MIL/uL (ref 3.87–5.11)
RDW: 13.2 % (ref 11.5–15.5)
WBC: 11.7 10*3/uL — ABNORMAL HIGH (ref 4.0–10.5)

## 2010-07-26 LAB — COMPREHENSIVE METABOLIC PANEL
ALT: 12 U/L (ref 0–35)
AST: 15 U/L (ref 0–37)
Albumin: 3.5 g/dL (ref 3.5–5.2)
Alkaline Phosphatase: 85 U/L (ref 39–117)
BUN: 8 mg/dL (ref 6–23)
CO2: 29 mEq/L (ref 19–32)
Calcium: 9.2 mg/dL (ref 8.4–10.5)
Chloride: 106 mEq/L (ref 96–112)
Creatinine, Ser: 0.74 mg/dL (ref 0.4–1.2)
GFR calc Af Amer: >60 mL/min (ref >60)
GFR calc non Af Amer: >60 mL/min (ref >60)
Glucose, Bld: 117 mg/dL — ABNORMAL HIGH (ref 70–99)
Potassium: 3.2 mEq/L — ABNORMAL LOW (ref 3.5–5.1)
Sodium: 141 mEq/L (ref 135–145)
Total Bilirubin: 1 mg/dL (ref 0.3–1.2)
Total Protein: 6.9 g/dL (ref 6.0–8.3)

## 2010-07-26 LAB — URINE MICROSCOPIC-ADD ON

## 2010-07-26 LAB — DIFFERENTIAL
Basophils Absolute: 0 10*3/uL (ref 0.0–0.1)
Basophils Relative: 0 % (ref 0–1)
Eosinophils Absolute: 0.1 10*3/uL (ref 0.0–0.7)
Eosinophils Relative: 1 % (ref 0–5)
Lymphocytes Relative: 24 % (ref 12–46)
Lymphs Abs: 2.8 10*3/uL (ref 0.7–4.0)
Monocytes Absolute: 0.7 10*3/uL (ref 0.1–1.0)
Monocytes Relative: 6 % (ref 3–12)
Neutro Abs: 8.1 10*3/uL — ABNORMAL HIGH (ref 1.7–7.7)
Neutrophils Relative %: 69 % (ref 43–77)

## 2010-07-26 LAB — CK TOTAL AND CKMB (NOT AT ARMC)
CK, MB: 2.8 ng/mL (ref 0.3–4.0)
Relative Index: 2.5 (ref 0.0–2.5)
Total CK: 113 U/L (ref 7–177)

## 2010-07-26 LAB — URINALYSIS, ROUTINE W REFLEX MICROSCOPIC
Bilirubin Urine: NEGATIVE
Glucose, UA: NEGATIVE mg/dL
Ketones, ur: NEGATIVE mg/dL
Nitrite: POSITIVE — AB
Protein, ur: 30 mg/dL — AB
Specific Gravity, Urine: 1.019 (ref 1.005–1.030)
Urobilinogen, UA: 0.2 mg/dL (ref 0.0–1.0)
pH: 6 (ref 5.0–8.0)

## 2010-07-26 LAB — URINE CULTURE: Colony Count: 50000

## 2010-07-26 LAB — TROPONIN I: Troponin I: 0.01 ng/mL (ref 0.00–0.06)
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
## 2010-08-24 NOTE — Assessment & Plan Note (Signed)
[facility] OFFICE NOTE   [name], [name]                       MRN:          [id number]  DATE:11/02/2006                            DOB:          [birth year]    CHIEF COMPLAINT:  Epigastric pain, indigestion.   ASSESSMENT:  64. A 65 year old African American woman status post prior [employer]      fundoplication with recurrent epigastric pain and indigestion      symptoms.  2. Personal history of colon cancer. I had performed colonoscopy in      2003, showing adenomatous colon polyp in the hepatic flexure with      gastritis on EGD and prior fundoplication. She also had      diverticulosis and hemorrhoids on her colonoscopy. She had      attempted to return to me for further evaluation of recurrent      melena in 2006, but somehow was told that she had not seen me in      the past and could not come to see me. She saw Dr.  Benson Norway who      diagnosed an ascending colon cancer in September 2006. She had a      resection through Dr.  Zella Richer and ended up having neo adjuvant      chemotherapy. I believe she was stage T3NO. She had a surveillance      colonoscopy in 2007, that was free of disease. She apparently has a      liver lesion and is due to have followup CT in August.  3. Chronic diarrhea since resection. Probably related to right      hemicolectomy.   PLAN:  1. I apologized to her about the refusal of our office to schedule her      a couple of years ago. I cannot explain that, she understands and      wishes to continue care with me and [employer].  2. Schedule EGD to evaluate this epigastric pain and dyspepsia.      Question if she is having problems related to her fundoplication.      Note, in the past, I had treated her for H-pylori. We will check      for that again. She is not on a proton pump inhibitor. The      antibiotics seem to help her. She could have recurrent H-pylori and      I  also wondered if she might not have had some sort of bacterial      overgrowth complaint as well.  3. Regarding her diarrhea, I did discuss possibly taking some Imodium.      There may be further treatment to be recommended something like      Colestid or cholestyramine as well. Will see what this other study      shows first.   HISTORY:  As above. She is complaining of a lot of indigestion.  Sometimes there is occasional dysphagia too. This is somewhat vague. She  feels pressure in the epigastrium. She has loose stools all the time,  though does not  awaken at night. It is mainly a postprandial phenomena.  She is not describing any bleeding at this time. That has been in the  past. Otherwise, history as above. See my medical history form.   MEDICATIONS:  1. Lantus 22 units morning, 22 units evening.  2. Atacand 32/12.5 mg b.i.d.  3. Carvedilol 12.5 mg b.i.d.  4. Glipizide 20 mg b.i.d.   DRUG ALLERGIES:  None known.   PAST MEDICAL HISTORY:  1. Colon cancer as above.  2. Hypertension.  3. Diabetes mellitus.  4. Osteoarthritis.  5. Prior hiatal hernia repair/fundoplication.  6. Hemorrhoid surgery.  7. Prior hysterectomy.  8. H-pylori, status post therapy.  9. History of nephrolithiasis.   SOCIAL HISTORY, FAMILY HISTORY AND REVIEW OF SYSTEMS:  See my medical  history form otherwise. Her father did have colon polyps.   PHYSICAL EXAMINATION:  Height 5 feet, 4 inches. Weight 155 pounds, blood  pressure 164/78, pulse 64.  EYES: Anicteric.  ENT: Teeth in fair to poor condition. Otherwise, free of lesions in the  mouth and posterior pharynx.  NECK: Is supple without mass.  CHEST: Is clear.  HEART: S1, S2. No murmurs or gallop.  ABDOMEN: Multiple surgical scars. No organomegaly or mass.  EXTREMITIES: There is no edema.  SKIN: No rash.   I have reviewed the previous colonoscopy reports from Dr.  Benson Norway. Also,  previous pathology report, operative note from [facility]. Office   records from [facility] show a recent hemoglobin  of 13.5 on May 6, with normal white count. I reviewed those office notes  as well.   Note, she needs a repeat colonoscopy in 2010 in September. The  colonoscopy report Dr.  Benson Norway says December 27, 2004 for the followup  examination, but I believe that is really 2007. I will clarify that with  the patient to be sure as the colonoscopy that diagnosed the colon  cancer was on December 23, 2004.     Gatha Mayer, MD,FACG  Electronically Signed   CEG/MedQ  DD: 11/02/2006  DT: 11/02/2006  Job #: [id number]   cc:   Hortencia Pilar, M.D.  Lauretta I. Odogwu, M.D.  Odis Hollingshead, M.D.

## 2010-08-24 NOTE — Discharge Summary (Signed)
[name], [name]                ACCOUNT NO.:  [id number]   MEDICAL RECORD NO.:  [id number]          PATIENT TYPE:  INP   LOCATION:  4735                         FACILITY:  [facility]   PHYSICIAN:  Jamal Collin. Hensel, M.D.DATE OF BIRTH:  19-Mar-1946   DATE OF ADMISSION:  04/03/2007  DATE OF DISCHARGE:  04/04/2007                               DISCHARGE SUMMARY   DISCHARGE DIAGNOSES:  1. Noncardiac chest pain.  2. Diabetes mellitus, type 2.  3. Hypertension.  4. Hypertriglyceridemia.  5. Anxiety.  6. Recently diagnosed meningioma.   DISCHARGE MEDICATIONS:  1. Aspirin 81 mg p.o. daily.  2. Coreg 25 mg p.o. b.i.d.  3. Glucotrol 10 mg p.o. daily.  4. Hydrochlorothiazide 12.5 mg p.o. daily.  5. Lantus 22 units each morning and 22 units each night.  6. Norvasc 10 mg p.o. daily.  7. Lisinopril 20 mg p.o. daily.  8. Isosorbide dinitrate 30 mg 1 tablet 4 times per day.  9. Ativan 1 mg twice daily.  10.Zocor 40 mg nightly.   PROCEDURES:  1. Echocardiogram showed overall normal left ventricular systolic      function with an EF 55-65%, inadequate study for evaluation of left      ventricular regional wall motion and mild focal  basilar septal      hypertrophy.  2. Cardiac catheterization showed an EF of 70%, mild plaque in the      aorta, and bilateral renal arteries patent.  It showed moderate      nonobstructive coronary artery disease, ranging 30-50% blockages.   DISCHARGE LABORATORY DATA:  Sodium 141, potassium 3.6, chloride 107,  bicarb 27, BUN 12, creatinine 0.67, glucose 158, calcium 9.0.  White  blood count 7.9, hemoglobin 10.9, hematocrit 31.7, platelets 231.  Cardiac enzymes negative x3 sets.  Total cholesterol 140, HDL 25,  triglycerides elevated at 689, and unable to calculate LDL secondary to  elevated triglycerides.   FOLLOWUP:  The patient is to follow up as previously scheduled with Dr.  Hoy Morn on April 18, 2007.  She is to return to clinic earlier if she  experiences any hematoma or bleeding at her catheterization site or any  further concerns.   HOSPITAL COURSE:  #1.  CHEST PAIN:  The patient was admitted from family practice center  clinic with sustained substernal chest pain radiating down the left arm  associated with nausea and diaphoresis.  She had a positive recent  treadmill test with chest pain and PVCs.  She had no significant change  in her EKG, and point-of-care cardiac enzymes were negative.  However,  given her symptoms and her risk factors, she was taken to cardiac  catheterization by Dr. Haroldine Laws and was found to have a normal ejection  fraction and nonobstructive coronary artery disease.  Her chest pain is  noncardiac in nature, likely related to anxiety, musculoskeletal, or  GERD.  For her nonobstructive coronary artery disease, we will continue  risk factor modification.   #2.  HYPERTENSION:  In the hospital when started on her home regimen of  Coreg 25 mg p.o. b.i.d., hydrochlorothiazide 12.5 mg daily, Norvasc 10  mg daily, and lisinopril 20 mg daily, her blood pressure was at her goal  of less than 130/80.  Continue these medications as an outpatient.   #3.  DIABETES:  Blood sugars have been moderately well controlled in the  hospital between 150s and 190s.  The patient will continue her home  Lantus regimen and Glucotrol XL. Follow up with her primary care  physician.  She is also to continue ACE inhibitor and aspirin.   #4.  HYPERTRIGLYCERIDEMIA:  This was discussed with the patient.  The  first step in treatment of hypertriglyceridemia will be better control  of diabetes, and then also we added a statin  Zocor 40 mg daily to  assist with lowering of triglycerides.  The patient's direct LDL was  unable to be calculated secondary to the elevated triglycerides.  The  patient was counseled about muscle aches and need for LFT monitoring,  and she expressed understanding and agreement with the plan to start the   medicine.   #5.  ANXIETY:  The patient's anxiety was likely playing a role in her  chest pain.  She has significant stress related to her recent diagnosis  of meningioma.  She had been out of Ativan per clinic note today.  This  was restarted in the hospital, and she will be discharged home with  this.   CONSULTATIONS:  [facility].      Brion Aliment, M.D.  Electronically Signed      Jamal Collin. Andria Frames, M.D.  Electronically Signed    JH/MEDQ  D:  04/04/2007  T:  04/04/2007  Job:  [id number]
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
## 2010-08-24 NOTE — Cardiovascular Report (Signed)
NAMEELONNA, [name]                ACCOUNT NO.:  [id number]   MEDICAL RECORD NO.:  [id number]          PATIENT TYPE:  INP   LOCATION:  [id number]                         FACILITY:  [facility]   PHYSICIAN:  Shaune Pascal. Bensimhon, MDDATE OF BIRTH:  06-25-1945   DATE OF PROCEDURE:  04/03/2007  DATE OF DISCHARGE:                            CARDIAC CATHETERIZATION   PRIMARY CARE PHYSICIAN:  Dr. Hortencia Pilar.   INDICATIONS:  Ms. Essenburg is a 65 year old woman with multiple cardiac  risk factors.  She has had two previous cardiac catheterizations in the  past, most recently December 2005 which showed nonobstructive coronary  artery disease.  She presented to clinic today reporting several-week  history of progressive chest pain radiating to her left arm.  She  apparently had a routine treadmill test last week, which she had poor  exercise tolerance and noted to have early ST depression.  She was once  again having chest pain today in clinic with markedly elevated blood  pressure, and she was admitted.  We are consulted.  Given her previous  cardiac catheterizations, we discussed Myoview versus repeat  catheterization.  Ms. Stonecypher elected to proceed with diagnostic  catheterization.   PROCEDURES PERFORMED:  1. Selective coronary angiography.  2. Left heart cath.  3. Left ventriculogram.  4. Abdominal aortogram.  5. StarClose femoral artery closure.   DESCRIPTION OF PROCEDURE:  The risks and indication of catheterization  were explained.  Consent was signed and placed on the chart.  A 5-French  arterial sheath was placed in right femoral artery using a modified  Seldinger technique.  Standard catheters including JL-4, JR-4 and angled  pigtail used  in procedure.  All catheter exchanges made over wire.  There are no apparent complications.  At the end the procedure, the  patient had her right femoral artery site closed with a StarClose  closure device.  There was good hemostasis.   Central aortic  pressure was a 131/60 with a mean of 88.  LV was 143/3  with an EDP of 9.  There was no aortic stenosis.   Left main was normal.   LAD coursed to the apex.  It gave off a large diagonal in the proximal  portion.  There is a 30% lesion in the mid-LAD.   Left circumflex gave off a small OM-1, a small OM-2, moderate sized OM-3  and a small posterolateral.  There is a 50% lesion in the ostium of the  very small OM-1 and 30% lesion in the mid AV groove circ.   Right coronary artery was a moderate-sized dominant vessel, gave off an  RV branch and PDA.  It had diffuse plaquing.  There is a 30% plaquing  throughout the proximal and mid section.  There is a 50% lesion in the  PDA.   Left ventriculogram done in the RAO position showed an EF of 70% with no  regional wall motion abnormalities and no mitral regurgitation.   Abdominal aortogram done to assess her renal arteries showed mild  plaquing.  Her abdominal aorta with patent renal arteries bilaterally.   ASSESSMENT:  1.  Moderate nonobstructive coronary artery disease.  2. Normal LV function.  3. Normal renal arteries.   PLAN/DISCUSSION:  I suspect her chest pain is non cardiac.  We will  continue aggressive risk factor modification.  She should be okay for  discharge from a cardiac perspective tonight as long as her groin  remains stable.      Shaune Pascal. Bensimhon, MD  Electronically Signed     DRB/MEDQ  D:  04/03/2007  T:  04/04/2007  Job:  [id number]   cc:   Hortencia Pilar, M.D.
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
## 2010-08-24 NOTE — Assessment & Plan Note (Signed)
[place] OFFICE NOTE   JAYLI, [name]                       MRN:          [id number]  DATE:03/19/2008                            DOB:          07-Mar-1946    [name] is a 65 year old female who I am asked to evaluate for  chest pain.  She has been seen in this office in the past.  She has had  multiple catheterizations in the past secondary to chest pain by her  report.  I do note that she had a previous catheterization performed in  December 2005, that showed nonobstructive disease.  Her last  catheterization was performed on April 03, 2007.  At that time she  was found to have a 30% mid LAD, a 50% lesion in the ostium of a very  small OM1, and a 30% lesion in the mid AV circumflex.  There was a 50%  PDA.  Her ejection fraction was 70%.  She has been treated medically.  Also, note that she had a Myoview last performed on June 28, 2006, that  showed no sign of scar ischemia, an ejection fraction of 60%.  She has  been recently complaining of occasional palpitations.  They typically  occur at night.  They are described as her heart skip.  However, they  are not sustained palpitations.  There is no associated chest pain,  shortness of breath, or syncope.  She also has dyspnea on exertion which  apparently has a chronic issue.  There is no orthopnea, PND, or pedal  edema.  She also has occasional pain in her chest.  She states it  typically occurs when she is lying down.  It lasts for approximately 20  seconds, resolve spontaneously.  It does not radiate.  It is not  pleuritic in position, nor it is related to food.  There are no  associated symptoms.  Because of the above, we were asked to further  evaluate.   MEDICATIONS:  1. Glipizide 20 mg p.o. b.i.d.  2. Insulin.  3. Carvedilol 25 mg daily p.o. b.i.d.  4. Aspirin 81 mg p.o. daily.  5. Actos 25 mg p.o. daily.  6. Altace 2.5 mg p.o. daily.   PHYSICAL EXAMINATION:  VITAL SIGNS:  Blood pressure of 140/80 and her  pulse is 68.  She weighs 164 pounds.  HEENT:  Normal.  NECK:  Supple with no bruits.  CHEST:  Clear.  CARDIOVASCULAR:  Regular rate and rhythm.  ABDOMINAL:  No tenderness.  EXTREMITIES:  No edema.  Her electrocardiogram from March 13, 2008,  shows a sinus rhythm at a rate of 60.  There are nonspecific T-wave  changes in the inferolateral leads.   DIAGNOSES:  1. Atypical chest pain - [name] continues to have occasional      atypical chest pain.  She has had multiple cardiac catheterizations      in the past, most recently in December 2008.  Description of her      symptoms are atypical, but she does have longstanding diabetes.  We  will plan to proceed with an adenosine Myoview.  If it shows no      ischemia, then we will not pursue further cardiac workup.  2. Dyspnea - this has also been a longstanding process and we will      quantify our left ventricular function with Myoview and also      exclude ischemia.  3. Palpitations - these are found to be most likely premature atrial      contractions or premature ventricular contractions.  They are non-      sustained nor is there any associated presyncope or syncope.  We      will continue with her Coreg.  4. Hypertension - her primary care physician following this, will      continue with the present medications.  5. Diabetes mellitus.  6. History of nonobstructive coronary disease - given the patient's      diabetes and history of plaque, she would benefit from a statin      long term.  I will leave this to her primary care physician.  7. History of colon cancer.  8. Gastroesophageal reflux disease.  9. History of anxiety/depression.   I will see her back on a p.r.n. basis depending the results of her  Myoview.     Denice Bors Stanford Breed, MD, [facility]  Electronically Signed    BSC/MedQ  DD: 03/19/2008  DT: 03/20/2008  Job #: [id number]   cc:   Eugenie Norrie, MD @ [facility]

## 2010-08-27 NOTE — Consult Note (Signed)
[facility]  Patient:    [name], [name] Visit Number: [id number] MRN: [id number]          Service Type: DSU Location: 2000 2012 01 Attending Physician:  Tiburcio Pea Dictated by:   Nikki Dom, M.D., [facility] [place]. Date: 05/08/01 Admit Date:  05/07/2001   CC:         [place]   Consultation Report  REASON FOR CONSULTATION: Thank you very much for asking me to see Sharlynn Oliphant in cardiac consultation because of chest pain.  HISTORY OF PRESENT ILLNESS: Ms. Lupfer is a 65 year old woman, with an extensive history of GE reflux disease, actually having undergone Nissen fundoplication in Q000111Q with a marked improvement in her symptoms.  Over recent weeks she has had some problems with intermittent chest discomfort and over the last couple of weeks has had marked escalation of chest discomfort, associated with primarily with exertion.  She describes this as like someone standing on her chest, with radiation into her neck and to her shoulders.  There is associated diaphoresis and nausea, and then relieved with rest.  It has occurred only minimally while at rest, and not aggravated at all by being recumbent.  She also has an extensive history of GE reflux disease, with a history of the Nissen as noted.  Her cardiac risk factors are notable for hypertension, diabetes, but a low HDL.  She was admitted yesterday.  She has continued to have some spells, though not significant.  Interestingly, her initial electrocardiogram showed T wave inversions in leads V3 to V6 and II, III, and F, which have resolved in the anterior leads by this morning though have persisted in the inferior leads.  SOCIAL HISTORY: The patient works at [employer].  She often walks home from work, three miles in about 45 minutes.  She is married.  She has two children.  Her son committed suicide in April 2002, and this has  been a markedly devastating experience.  CURRENT MEDICATIONS:  1. Actos.  2. Altace 5 mg.  3. Atenolol 50 mg.  4. Sliding-scale insulin.  ALLERGIES: No known drug allergies.  PAST SURGICAL HISTORY:  1. Hysterectomy.  2. Bladder tack.  3. Colonoscopy with polyp.  4. Nissen fundoplication.  REVIEW OF SYSTEMS: Notable as above, otherwise noncontributory.  PHYSICAL EXAMINATION:  GENERAL: She is a middle-aged African-American female, in no significant distress.  VITAL SIGNS: Blood pressure 136/70, pulse 72, respirations 20 and unlabored.  HEENT: No scleral icterus.  TMs normal.  NECK: The neck veins were flat.  Carotids were brisk and full bilaterally without bruits.  BACK: Without kyphosis or scoliosis.  LUNGS: Clear.  HEART: Sounds regular without murmurs or gallops.  No lymphadenopathy.  ABDOMEN: Soft, with active bowel sounds.  Without midline pulsation or hepatomegaly.  EXTREMITIES: Femoral pulses 2+, distal pulses intact.  No clubbing, cyanosis, or edema.  SKIN: Warm and dry.  NEUROLOGIC: Grossly normal.  LABORATORY DATA: Electrocardiogram is as described above, with interval this morning of 0.14/0.08/0.41.  As noted, troponins and CKs were normal.  HDL 39.  IMPRESSION:  1. Chest pain with typical and atypical features, including:     a. Symptoms being typical with radiation and associated ______        phenomenon.     b. Exertional.     c. Associated with brackish taste in her mouth.  2. Cardiac risk factors notable for hypertension, diabetes, and a low  HDL.  3. Gastroesophageal reflux disease, with history of a Nissen fundoplication     in Q000111Q.  4. Depression and anxiety since suicide death of her son in 09-Aug-2000.  5. Electrocardiogram changes associated with #1, with normal enzymes.  RECOMMENDATIONS: Ms. Bochenek has chest pain that is quite concerning.  She has features that are both suggestive of significant ischemic heart disease with  a low threshold for exertional chest pain, the abnormal electrocardiogram, and the multiple risk factors.  However, the fact that she has an extensive gastrointestinal history and that she has a brackish taste associated with these episodes makes that a little bit less concerning.  However, I still think the post test probability is going to be sufficiently that if she were to have recurrent chest pain that she would need to be catheterized.  Also, there is a huge amount of anxiety in the family right now related to the suicidal death of her son, and fear across-the-board about the patients likelihood of dying that with the borderline pretest probability, I favor catheterization.  I have reviewed this with her and with Dr. Lavella Hammock of the teaching service.  Dr. Oneida Alar was not available at this time.  I have reviewed the procedure with the patient as well, and would recommend that we undertake this.  This is tentatively scheduled for tomorrow.Dictated by:   Nikki Dom, M.D., [facility] Attending Physician:  Tiburcio Pea DD:  05/08/01 TD:  05/09/01 Job: [id number]

## 2010-08-27 NOTE — Procedures (Signed)
[place]  Patient:    [name], [name]                       MRN: [id number] Proc. Date: 09/28/99 Adm. Date:  [id number] Disc. Date: [id number] Attending:  Rafael Bihari CC:         Everlene Balls, M.D., [place]                           Procedure Report  PROCEDURE PERFORMED:  Colonoscopy with polypectomy.  INDICATION FOR PROCEDURE:  Recurrent hemoccult positive stool with last colon imaging three years ago.  DESCRIPTION OF PROCEDURE:  The patient was placed in the left lateral decubitus position and placed on the pulse monitor with continuous low flow oxygen delivered by nasal cannula. She was sedated with 60 mg IV Demerol and 7 mg IV Versed.  The Olympus video colonoscope was inserted into the rectum and advanced to the cecum, confirmed by transillumination of McBurneys point and visualization of the ileocecal valve and appendiceal orifice.  The prep was good.  At the base of the cecum was a diminutive polyp approximately 6 mm in diameter and this was fulgurated by hot biopsy.  The remainder of the cecum, ascending and transverse descending and sigmoid colon appeared normal with no further polyps, masses, diverticula or other mucosal abnormalities. The rectum likewise appeared normal and retroflexed view of the anus revealed no obvious internal hemorrhoids.  The colonoscope was then withdrawn and the patient returned to the recovery room in stable condition.  She tolerated the procedure well and there were no immediate complications.  IMPRESSION: Small cecal polyp otherwise normal colonoscopy.  PLAN: Await histology for determination of interval for further colon screening. DD:  09/28/99 TD:  09/30/99 Job: [id number]

## 2010-08-27 NOTE — Assessment & Plan Note (Signed)
[place] OFFICE NOTE   Anne, SCHROTENBOER                       MRN:          [id number]  DATE:06/20/2006                            DOB:          12/11/1945    [name] is a 65 year old female with multiple medical problems who  was recently admitted to [facility] with multiple complaints.  It was felt that she had the flu.  She was also complaining of melena  and also of atypical chest pain that was felt to be musculoskeletal in  etiology.  She ruled out for myocardial infarction with serial enzymes.  She did have an echocardiogram performed on May 31, 2006.  There  was mild, asymmetric septal hypertrophy with normal LV function.  Note,  her chest pain had been present for several months without ever  completely resolving.  She continues to have the chest pain since  discharge.  It never completely resolves.  It does increase with  exertion, but also with certain movements.   CURRENT MEDICATIONS:  1. Atacand HCT 32/12.5 mg tablets 1 p.o. daily.  2. Coreg 6.25 mg p.o. daily.  3. Insulin.   PHYSICAL EXAMINATION:  Blood pressure of 189/95 and her pulse is 85.  Her neck is supple.  Her chest is clear.  CARDIOVASCULAR:  Regular rate and rhythm.  EXTREMITIES:  Show no edema.   Electrocardiogram shows a sinus rhythm at a rate of 80.  There is  inferolateral T-wave inversion.   DIAGNOSES:  1. Atypical chest pain.  2. Hypertension.  3. Diabetes mellitus.  4. Question history of melena.  5. Gastroesophageal reflux disease.  6. History of colon cancer.  7. Anxiety/depression.   PLAN:  [name] continues to have chest pain that does not sound  cardiac.  There is some reproduction with palpation of her chest wall.  However, she has multiple risk factors, and we will plan to proceed with  a Myoview.  If it shows normal perfusion, we will not proceed with  further cardiac workup.  She  does need risk factor modification.  Her  blood pressure is elevated today, and I have asked her to increase her  Coreg to 12.5 mg p.o. b.i.d.  I then asked her to follow up with her  primary care physician for her blood pressure, diabetes, and other risk  factors.  Given her diabetes mellitus and the fact that this is a  coronary artery disease equivalent, I think she would benefit from a  Statin long term when the primary care physicians feel this is  appropriate.  She would also benefit from baby aspirin once her  gastrointestinal  system is cleared, which was an issue in the hospital.  She will see me  back on an as-needed basis if her Myoview is normal.     Denice Bors. Stanford Breed, MD, [facility]  Electronically Signed    BSC/MedQ  DD: 06/20/2006  DT: 06/22/2006  Job #: [id number]   cc:   Hortencia Pilar, M.D.

## 2010-08-27 NOTE — H&P (Signed)
NAMEARRIANNE, CANSLER                          ACCOUNT NO.:  [id number]   MEDICAL RECORD NO.:  [id number]                   PATIENT TYPE:  INP   LOCATION:  2028                                 FACILITY:  [facility]   PHYSICIAN:  Loura Back. Lavella Hammock, M.D.               DATE OF BIRTH:  July 31, 1945   DATE OF ADMISSION:  02/21/2003  DATE OF DISCHARGE:                                HISTORY & PHYSICAL   CHIEF COMPLAINT:  Weakness x1 day.   HISTORY:  Ms. [name] is a 65 year old African-American female with previous  history significant for diabetes, hypertriglyceridemia, and coronary artery  disease who presents today with a 1 day history of feeling lightheaded, with  some shortness of breath on exertion associated with chest pain, which is  retrosternal, lasting approximately 15-20 seconds at a time, and is  alleviated by rest.  The patient has difficulty describing the chest pain,  other than it is like when I had my heart attack.  Also, patient has  complained of sore throat for the past day.   REVIEW OF SYSTEMS:  Significant for weakness, fatigue, dizziness and  positive chills for 1 day.  Diarrhea for the past week.  The patient denied  any rashes or neurological symptoms.  She does state that she had polyuria  for the past week as well, and that her blood sugars have been higher than  normal, although she does state her normal blood sugars run around 250  fasting.   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   MEDICATIONS:  1. Aspirin 325 mg a day.  2. Estrace vaginal cream daily.  3. Insulin 70/30, 16 units in the morning and 8 units in the evening.  4. Nitroglycerin sublingual 0.4 mg p.r.n.   In addition to those, she is supposed to be taking:  1. Altace 5 mg a day.  2. Glipizide 20 mg a  day.  3. Lantis 14 units q.h.s.  4. Metformin 1000 mg b.i.d.  5. Toprol  XL 25 mg a day.  However, she currently is not taking those 5 medications due to monetary  issues.   ALLERGIES:  NO KNOWN DRUG ALLERGIES.   SOCIAL HISTORY:  Patient's husband just got laid off, and she is working at  the [employer], trying to keep the 2 of them above water. She  is currently very stressed out about this. She denies smoking.   FAMILY HISTORY:  Unknown.   PHYSICAL EXAMINATION:  VITAL SIGNS:  Patient is afebrile.  Initial blood  pressure 157/83, pulse 78, she was not orthostatic in the office.  GENERAL:  This is an ill-appearing female in no acute distress.  HEENT:  [place]/AT, PERRL, EOMI.  Nares patent. Mouth, oropharynx shows some mild  erythema.  NECK:  Supple, no masses or lymphadenopathy.  LUNGS:  CTAP.  CARDIOVASCULAR:  Positive RRR.  ABDOMEN:  Soft, nontender, nondistended.  Bowel sounds positive.  NEUROLOGIC:  Cranial nerves II-XII grossly intact. There is no focal  neurologic deficit.   LABORATORY DATA:  UA showed greater than 1000 glucose, trace ketones,  negative nitrates, negative leukocyte esterase and negative blood.  White  count 9.7, hemoglobin 14, hematocrit 41.2, platelet count 317,000. CBG 323.  EKG shows no changes from her previous EKG in November 2003.  CMP and  cardiac enzymes are pending.   ASSESSMENT/PLAN:  1. Chest pain.  Given the extent of the patient's uncontrolled diabetes,     this patient will need to be ruled out for an MI with EKG's and enzymes.     She was given an aspirin in the clinic 325 mg prior to being sent to the     hospital.  Although it is possible that this all simply from a viral     illness, given the patient's history and current symptoms, I feel rule     out is warranted.  2. Diabetes mellitus. Will place the patient on her home dose of 70/30, and     add a sliding scale insulin. Currently, the patient is at high risk for      becoming volume depleted, given her uncontrolled diabetes and her recent     polyuria and diarrhea.  3. Hypertension.  In addition to its benefit for coronary artery disease,     will restart the patient on her ACE and beta-blocker for her     hypertension.  4. Psychiatric, anxiety and depression.  The patient denies ever having been     on antidepressants or anxiolytics, but I would recommend that she be     started on them, if not in-hospital, soon thereafter.                                                Loura Back Lavella Hammock, M.D.    SAG/MEDQ  D:  02/23/2003  T:  02/23/2003  Job:  [id number]   cc:   Barth Kirks C. Alford Highland, M.D.  Fam. Med - Resident - [place], [place] [address]  Fax: [phone]

## 2010-08-27 NOTE — Discharge Summary (Signed)
[name], [name]                ACCOUNT NO.:  [id number]   MEDICAL RECORD NO.:  [id number]          PATIENT TYPE:  INP   LOCATION:  1615                         [facility]   PHYSICIAN:  Odis Hollingshead, M.D.DATE OF BIRTH:  Feb 14, 1946   DATE OF ADMISSION:  03/07/2005  DATE OF DISCHARGE:  03/15/2005                                 DISCHARGE SUMMARY   PRINCIPAL DISCHARGE DIAGNOSIS:  Right colon cancer (T3, N0).   SECONDARY DIAGNOSES:  1.  Type 2 diabetes mellitus.  2.  Hypertension.  3.  Acute on chronic anemia.  4.  Acute delirium.   PROCEDURE:  Laparoscopic-assisted right colectomy.   REASON FOR ADMISSION:  [name] is a 65 year old female who was noted to  be anemic, and a colonoscopy demonstrated a mass in the ascending colon,  which was biopsied and positive for adenocarcinoma.  She was admitted for  elective resection.   HOSPITAL COURSE:  She underwent a laparoscopic-assisted right colectomy on  March 07, 2005.  Postoperatively, she had some acute on chronic anemia  but remained hemodynamically stable.  She was placed on a sliding-scale  insulin for type 2 diabetes mellitus.  She was mobilized on the first  postoperative day.  Hemoglobin did drop to 7.1, and she was a little dizzy.  Thus, she was given 2 units of packed red blood cells on March 09, 2005.  Pathology came back T3, N0 with lymphovascular invasion.  The plan was to  get her an outpatient oncology consult.  She had a little bit of confusion,  and she was given some Ambien that night, and once this was stopped, her  acute delirium/confusion improved.  Her antihypertensives were restarted as  well as her oral hypoglycemics.  She had a mild ileus, but this slowly began  to improve.  She had some mild hypokalemia as well, but this improved with  supplementation.  By her eighth postoperative day, she was tolerating a  diet, her bowels were moving, she was feeling stronger, and she was able to  be  discharged.   DISPOSITION:  Discharged to home on March 15, 2005, in satisfactory  condition.  She is to come back to see me in two weeks.  Staples were  removed.  She was given Tylox for pain and told to continue her home  medications.      Odis Hollingshead, M.D.  Electronically Signed     TJR/MEDQ  D:  04/18/2005  T:  04/19/2005  Job:  [id number]

## 2010-08-27 NOTE — Op Note (Signed)
[name], [name]                ACCOUNT NO.:  [id number]   MEDICAL RECORD NO.:  [id number]          PATIENT TYPE:  AMB   LOCATION:  SDS                          FACILITY:  [place]   PHYSICIAN:  Odis Hollingshead, M.D.DATE OF BIRTH:  November 06, 1945   DATE OF PROCEDURE:  04/27/2006  DATE OF DISCHARGE:                               OPERATIVE REPORT   PREOPERATIVE DIAGNOSIS:  Retained Port-A-Cath.   POSTOPERATIVE DIAGNOSIS:  Retained Port-A-Cath.   PROCEDURE:  Port-A-Cath removal.   SURGEON:  Odis Hollingshead, M.D.   ANESTHESIA:  Local (1% lidocaine with epinephrine) with MAC.   INDICATIONS:  This 65 year old female had colorectal cancer and has  undergone chemotherapy treatments.  She is finished with her  chemotherapy.  She had a Port-A-Cath placed and now presents for  removal.  We discussed the procedure preoperatively.   TECHNIQUE:  She was seen in the holding area and brought to the  operating room, and placed supine on the operating table and given some  intravenous sedation.  The left upper chest wall was sterilely prepped  and draped.  The Port-A-Cath incision site was identified.  Local  anesthetic was infiltrated superficially and deep.  The previous  incision was reincised sharply and incision carried down to through the  subcutaneous tissues until the catheter was identified.  The fibrous  sheath around the catheter was dissected and freed up and the catheter  was removed and direct pressure held in the left infraclavicular region  for approximately 10 minutes.  I then used electrocautery to dissect the  fibrous sheath off of the port and removed the entire apparatus in toto.  I inspected the Port-A-Cath bed and controlled bleeding with  electrocautery.  Once hemostasis was adequate, I then closed the wound  in two layers.  The subcutaneous tissue was approximated with a running  3-0 Vicryl suture and skin closed with 4-0 Monocryl subcuticular stitch.  Steri-Strips  and a sterile dressing were applied.   She tolerated the procedure without any apparent complications and was  taken to recovery room in satisfactory condition.      Odis Hollingshead, M.D.  Electronically Signed     TJR/MEDQ  D:  04/27/2006  T:  04/27/2006  Job:  [id number]

## 2010-08-27 NOTE — Discharge Summary (Signed)
[name], [name]                          ACCOUNT NO.:  [id number]   MEDICAL RECORD NO.:  [id number]                   PATIENT TYPE:  INP   LOCATION:  2028                                 FACILITY:  [facility]   PHYSICIAN:  Vickki Hearing, M.D.                  DATE OF BIRTH:  [birth year]   DATE OF ADMISSION:  02/21/2003  DATE OF DISCHARGE:  02/23/2003                                 DISCHARGE SUMMARY   PRIMARY CARE PHYSICIAN:  Dr. Barth Kirks C. [facility], [facility].   DISCHARGE DIAGNOSES:  1. Atypical chest pain, non-cardiac.  2. Diabetes mellitus.  3. Hypertension.   DISCHARGE MEDICATIONS:  1. Aspirin 81 mg p.o. q. day.  2. Insulin 70/30 16 units b.i.d.  3. Enalapril 5 mg p.o. q. day.   BRIEF ADMISSION HISTORY:  This is a 65 year old female who presented with a  one day history of intermittent chest pain associated with headache,  weakness, sore throat, and dyspnea on exertion.  Chest pain is substernal  and the patient had a lot of difficulties describing the nature of it.  She  states it is like when I had my heart attack.  She states that is lasts  approximately 15 to 20 minutes.   BRIEF HOSPITAL COURSE:  #1 -  CHEST PAIN:  The patient was admitted with the  history as outlined above.  Upon initially evaluating the patient she was  hypertensive, blood pressure 157/83.  Otherwise, she had a normal physical  exam.  EKG was unchanged since November 2003.  She was given aspirin in the  clinic and this was continued while in the hospital.  She was admitted for  serial cardiac enzymes.  She ruled out.  However, she continued to have some  intermittent chest pain which was relieved by nitroglycerin.  Consequently,  [facility] was consulted and they performed a Cardiolite on February 23, 2003.  The Cardiolite exam revealed normal motion, normal EF of 68%, and  no ischemia.  They deemed no further cardiology workup was needed.  Ms.  [name] continued to do well  while in the hospital, having some intermittent  chest pain which was most likely viral associated.  She was discharged on  February 23, 2003, in good condition.  #2 -  DIABETES MELLITUS:  [name] is on insulin 70/30 16 units in the  morning and 8 units at night as an outpatient.  She had been on her other  diabetic medications, but these had been stopped due to financial reasons.  While in the hospital, she was placed on insulin 16 units b.i.d. with fair  control of her blood sugar.  They fluctuated tremendously while in the  hospital.  I believe further titration of her other anti-diabetic  medications will be needed.  This can be worked on as an outpatient.  #3 -  HYPERTENSION:  Due to the concern  of coronary artery disease and her  symptoms, she was restarted on Toprol and Altace while in the hospital.  Her  blood pressure improved and was found to be 120/70s for the last day of  admission.  However, she says she is unable to afford this.  Consequently,  she was switched to Enalapril at the time of discharge in the hopes that she  can afford this.  #4 -  FINANCIAL ISSUES:  The patient states her husband recently lost his  job and they have been having a lot of trouble affording things, even though  she has insurance.  Consequently, she will be placed on the medications  named above.  I have asked her to contact Clarice Pole to see if she qualifies  for any further assistance.  She can do this as an outpatient.   ACTIVITY:  No restrictions.   DIET:  American Diabetic Diet.   SPECIAL INSTRUCTIONS:  The patient was advised to keep track of all of her  blood sugars and bring them to her next appointment with Dr. Alford Highland.   FOLLOWUP:  1. [facility] with Dr. Alford Highland.  The patient was advised to     call in the morning at [phone] to set up a follow up appointment.  2. Clarice Pole.  The patient was advised to call the [facility]     to get [name]'s number  to contact her for further financial assistance.                                                Vickki Hearing, M.D.    AK/MEDQ  D:  02/23/2003  T:  02/23/2003  Job:  [id number]   cc:   Barth Kirks C. Alford Highland, M.D.  Fam. Med - Resident - [place], [place] [address]  Fax: [phone]

## 2010-08-27 NOTE — Discharge Summary (Signed)
[name], [name]                ACCOUNT NO.:  [id number]   MEDICAL RECORD NO.:  [id number]          PATIENT TYPE:  INP   LOCATION:  [id number]                         FACILITY:  [facility]   PHYSICIAN:  Mayra Neer, M.D.   DATE OF BIRTH:  08-Sep-1945   DATE OF ADMISSION:  05/31/2006  DATE OF DISCHARGE:  06/01/2006                               DISCHARGE SUMMARY   PRIMARY CARE PHYSICIAN:  Hortencia Pilar, M.D. of [place].   CONSULTS:  Cardiology, Dr. Stanford Breed.   PROCEDURES AND IMAGING:  1. Echocardiogram that showed mild asymmetric septal hypertrophy with      an overall left ventricular systolic function of [id number] and no      regional wall abnormalities.  2. KUB, no acute changes.  3. A chest x-ray with no acute disease.   DISCHARGE DIAGNOSES:  1. Atypical chest pain, likely musculoskeletal in origin.  2. Influenza A.  3. Diabetes type 2, uncomplicated.  4. Hypertriglyceridemia.  5. Anxiety and depression.  6. A history of melena and a history of gastritis.  7. Gastroesophageal reflux disease.  8. A history of colon cancer status post resection.  9. Hypertension.  10.Coronary artery disease.  11.Hemorrhoids.   MEDICATIONS:  1. Atacand 32/12.5 mg 1 tablet p.o. daily.  2. Glucotrol 10 mg p.o. b.i.d.  3. Lantus 20 units subcu b.i.d.  4. Coreg 6.25 mg p.o. b.i.d.  5. Tamiflu 75 mg p.o. b.i.d. until June 05, 2006.  6. Prilosec 20 mg p.o. daily.  7. Note:  Aspirin was held until patient follows up with Dr. Benson Norway, and      Statin will be held until patient has recovered from the flu and      follows up with Dr. Hoy Morn.  8. The patient was instructed not to take her Toprol-XL or any NSAIDs      as well as not taking her Coreg before her stress test on February      29.   LABS:  Hemoglobin Alc 9.2.  Hemoccult negative in the office and in the  hospital.  Cardiac enzymes negative x3.  Hemoglobin on discharge 12.6.  Chemistries within normal limits except for a  glucose of 282, creatinine  was 0.81.  LFTs were within normal limits.  Total cholesterol was 129,  triglycerides 222, HDL 38, LDL 47.  Helicobacter negative.  TSH 0.411.   FOLLOWUP:  1. Dr. Benson Norway with Gastroenterology on Monday, February 25 at 2 p.m.  2. Myoview on February 29 at 8 a.m. and a followup with Dr. Stanford Breed      on March 11 at 3:30 p.m.  3. Dr. [facility] on April 4 at 8:30 a.m.   HOSPITAL COURSE:  [name] with a  history of diabetes, coronary artery disease, hypertension,  hyperlipidemia, and colon cancer status post resection and chemotherapy  who presented with atypical chest pain and complaint of melena.   By problems:  1. Atypical chest pain.  The patient was considered at high risk for a      cardiac source of her  chest pain and was admitted for a rule out of      myocardial ischemia.  Cardiology was consulted and felt that with      her negative cardiac enzymes and no significant changes in her EKG      and a relatively normal echo as well as reproducible chest pain,      that patient was okay for a Myoview as an outpatient, that this was      likely musculoskeletal in origin.  She was started on a beta      blocker and restarted her on her Ativan, which she had previously      not been taken.  She was not started on a Statin given her      diagnosis of flu, nor was she was started on an aspirin given that      she had a complaint of melena.  Both of these will need to be      started, however, once she recovers from the flu and she consults      Dr. Benson Norway for management of possible gastritis.  Her diabetes and      triglycerides were not well controlled, mostly because she refuses      to take her medications continuously.  She was restarted on her      Glucotrol and Lantus, and the importance of keeping these under      control was emphasized at length with [name].  2. Influenza A.  The patient  developed fever to 101.6, malaise, body      aches, and fatigue the first night of her admission.  She had      numerous family members currently suffering from similar flu-like      symptoms, and she was started on Tamiflu and will need to continue      this for 5 days.  Influenza swab was pending at discharge.  3. A history of melena.  The patient complained of melena and bright      red blood per rectum, although she was hemoccult negative both in      the clinic and in the hospital, and visualization of her stool by      health care providers was brown.  Her hemoglobin was stable and she      was not orthostatic, so it was felt it was safe for the patient to      be followed up by Dr. Benson Norway as an outpatient on Monday, the 25th, at      2 p.m. for further management.  Aspirin was not started secondary      to this complaint but will need to be started once this is cleared      up.  I expressed to the patient the importance of following up with      GI and that she should avoid NSAIDs, including things like Goody's      powders,given her history of gastritis, Tylenol for pain only.  She      was started on Prilosec OTC 20 mg p.o. daily.           ______________________________  Mayra Neer, M.D.     KS/MEDQ  D:  06/01/2006  T:  06/02/2006  Job:  [id number]

## 2010-08-27 NOTE — Op Note (Signed)
[name], Anne                ACCOUNT NO.:  [id number]   MEDICAL RECORD NO.:  [id number]          PATIENT TYPE:  AMB   LOCATION:  SDS                          FACILITY:  [facility]   PHYSICIAN:  Odis Hollingshead, M.D.DATE OF BIRTH:  Sep 06, 1945   DATE OF PROCEDURE:  04/18/2005  DATE OF DISCHARGE:  04/18/2005                                 OPERATIVE REPORT   PREOP DIAGNOSIS:  Right colon cancer.   POSTOPERATIVE DIAGNOSIS:  Right colon cancer.   PROCEDURE:  Insertion of single-lumen Port-A-Cath for fluoroscopic guidance.   SURGEON:  Odis Hollingshead, M.D.   ANESTHESIA:  Local (1% lidocaine) with MAC.   INDICATIONS:  65 year old female status post right colectomy for colon  cancer. She is felt to be a good candidate chemotherapy any longer needs  long-term venous access. She now presents for Port-A-Cath insertion. We  discussed the procedure and risks including but not limited to bleeding,  infection, wound healing problems, catheter malfunction, DVT, pneumothorax,  anesthesia. I also discussed my recommendation for the 1 milligram of  Coumadin daily while the Port-A-Cath is in.   TECHNIQUE:  She is seen in holding area and brought to the operating room,  placed supine on the operating table and given intravenous sedation. A roll  was placed under the back. The upper chest wall and neck were sterilely  prepped and draped. Local anesthetic was infiltrated inferior to the left  clavicle and a hollow needle was used to cannulate the left subclavian vein.  A wire was passed through the needle was verified in the vena cava by way of  fluoroscopic guidance.   The needle was then removed and the wire left in place. On the left chest  wall inferior to the cannulation site local anesthetic was infiltrated  superficially and deep and an incision was made through the skin and  subcutaneous tissue and using electrocautery, a pocket was created for the  port.  I then made an incision  around the wire. The tunnel was created and  the catheter was passed from the inferior incision up to the superior  incision. The dilator introducer complex was then placed over the wire and  the dilator and wire were removed. The catheter was then threaded through  the introducer complex and the peel-away sheath introducer was removed.   The catheter aspirated blood. Under fluoroscopic guidance I pulled the  catheter back until the tip was in the distal superior vena cava. I then cut  the catheter and threaded onto the port. The port aspirated blood and  flushed easily. The pole was then anchored to the chest wall with  interrupted 2-0 Vicryl sutures. I then placed concentrated heparin solution  in the port.   Hemostasis was adequate at this time and I once again used fluoroscopy to  verify the position of the catheter tip. Following this, the subcutaneous  tissue was closed over the port with a running 3-0 Vicryl suture. The skin  of both incisions was closed with 4-0 Monocryl subcuticular stitch. Steri-  Strips and sterile dressing were applied.   She tolerated  procedure without apparent complications was taken to recovery  in satisfactory condition. A portable chest x-ray is pending.      Odis Hollingshead, M.D.  Electronically Signed     TJR/MEDQ  D:  04/18/2005  T:  04/19/2005  Job:  [id number]

## 2010-08-27 NOTE — Consult Note (Signed)
NAMESONATA, HOCKLEY                ACCOUNT NO.:  [id number]   MEDICAL RECORD NO.:  [id number]          PATIENT TYPE:  INP   LOCATION:  3733                         FACILITY:  [facility]   PHYSICIAN:  Denice Bors. Stanford Breed, MD, FACCDATE OF BIRTH:  Sep 07, 1945   DATE OF CONSULTATION:  05/31/2006  DATE OF DISCHARGE:                                 CONSULTATION   SUMMARY OF HISTORY:  Ms. Ocana is a 65 year old African American  female who was admitted by the teaching service after she presented to  their office today with multiple complaints.  Ms. Mada states that  she has not felt well since July 2007.  Her multiple complaints consist  of reoccurring colds, generalized weakness particularly in her legs,  chest discomfort, blurry vision, headaches, multiple GI complaints.  After seeing her physician today in the office, she was sent to the ER  for admission.  We are asked to consult secondary to chest discomfort.  Ms. Allton states that she has anterior dull chest discomfort since her  chemotherapy July 2007.  She said that she has not paid attention to it  very much and she cannot relate a duration, frequency, etc.  However,  since April 27, 2006 after her Port-A-Cath was removed, she has had  increased episodes she describes as a dull discomfort with radiation  into the abdomen, both arms and her back.  It occurs nocturnally with  exertion.  With rest, she obtains some relief.  The intense episodes  usually last less than 15 minutes.  She states the discomfort can  sometimes be a 7 to an 8, although usually it is a 2.  She cannot recall  the last time it was a zero, meaning no discomfort at all.  She states  that it was the least prior to her Port-A-Cath removal.  She denies any  accidents or injuries.  It is noted that she is somewhat of a poor  historian.   PAST MEDICAL HISTORY:  Allergies include, according to teaching service,  ENALAPRIL for which she develops nausea and throat  closing.  She is not  on any prescriptions.  She ran out of her prescriptions in October 2007  secondary to her insurance was cancelled.  She is now currently on [employer]  and covered by her husband's policy since he works at [employer].  Past  medical history is notable for hypertension, type 2 diabetes.  She does  continue to check her sugars and her blood pressures at home.  These  usually are round 195/98 and greater than the 200s, per the patient.  She has a history of anxiety, depression, hyperlipidemia.  She underwent  a cardiac catheterization in 2005 secondary to a hypertensive response  on a stress test that was never completed.  Catheterization at that time  showed EF of 60%, 30% proximal LAD, 50% OM1, patent renals and aorta and  iliacs.  Her last adenosine Myoview was on February 23, 2003.  It showed  an EF of 68% and no ischemia.  She has also been treated for H. pylori  in 2003, irritable bowel  syndrome, iron-deficiency anemia, GERD and  gastritis.  She was diagnosed with colon cancer, adenocarcinoma T3 N0  and underwent colectomy in November 2006.  She also went through  chemotherapy and last treatment was July 2007.   SOCIAL HISTORY:  She resides in [place] with her husband.  She is  unemployed at this time since December 2006.  She had one son commit  suicide.  One daughter is alive with diabetes.  Six grandchildren, one  great grandchild.  She has never smoked.  Denies alcohol, drugs or  herbal medications, specific diet or exercise.  She does not know the  health history of her mother.  Her father died at age 17 with  complications from hemodialysis.  History of diabetes and hypertension.  She has two half sisters and two half brothers who are alive and well.   REVIEW OF SYSTEMS:  Review of systems is notable for recent temperatures  up to 101, chills, poor dentition, reading glasses, chest discomfort as  described above, sinus congestion, nocturia, generalized weakness,   depression, anxiety, nausea, diarrhea, GERD, abdominal discomfort, dark  tarry stools according to the patient, postmenopausal.  Specifically,  she denies associated shortness of breath, dyspnea on exertion, edema,  orthopnea or PND.   PHYSICAL EXAM:  GENERAL:  Well-nourished, well-developed pleasant  African American female in no apparent distress.  Temperature is 97.2,  blood pressure is 197/85, pulse 79 and regular, respirations 18 and  regular, 100% sat on room air.  HEENT:  Grossly unremarkable except for poor dentition.  NECK:  Neck is supple without thyromegaly, adenopathy, JVD or carotid  bruits.  CHEST:  Symmetrical excursion.  Lung sounds are slightly diminished at  right base.  HEART:  PMI is not displaced.  Regular rate and rhythm.  I  did not appreciate any murmurs, rubs, clicks or gallops.  All pulses are  symmetric and intact.  I did not appreciate any abdominal or femoral  bruits.  SKIN:  Integument appears to be intact.  ABDOMEN:  Bowel sounds present, soft.  I did not appreciate any masses.  Slight tenderness throughout her abdomen without organomegaly or masses.  EXTREMITIES:  No cyanosis, clubbing or edema.  MUSCULOSKELETAL:  Grossly unremarkable.  However, with palpation of  anterior chest, she did have some chest discomfort that was not  reminiscent of her above symptoms.  NEUROLOGICAL:  Grossly unremarkable.   Chest x-ray showed no active disease.  Abdominal films showed  postoperative changes.  EKG showed normal sinus rhythm at 76, normal  axis, normal intervals, early R-wave, nonspecific ST-T wave changes.  No  old EKGs for comparison.  H&H is 13.9 and 40.6, normal indices,  platelets 231, WBC 7.6.  Sodium 139, potassium was low at 3.3, BUN 6,  creatinine 0.67, glucose 295, normal LFTs.  CK-MB was 109, troponin  0.01.  Urinalysis was positive for greater than 1000 mg of glucose.   IMPRESSION: 1. Prolonged atypical chest discomfort:  Last time her discomfort  was      a zero, the patient was not sure; it seems to be at least a month      ago.  2. Hypertension.  3. Hyperglycemia with a history of diabetes:  Both are poorly      controlled.  4. Multiple gastrointestinal complaints.  5. Hypokalemia:  Noncompliance with medication secondary to lack of      insurance and finances.  6. History as previously.   PLAN:  Dr. Stanford Breed reviewed the patient's history, spoke with  and  examined the patient and agrees with the above.  Agree with admission to  rule out myocardial infarction.  Proceed with control of hypertension  and her diabetes and GI evaluation.  Would add beta blocker and increase  as necessary as well as aspirin and statin.  If she does rule out for  myocardial infarction, we will arrange an outpatient adenosine Myoview  as well as follow-up appointment.      Sharyl Nimrod, PA-C      Denice Bors Stanford Breed, MD, [facility]  Electronically Signed    EW/MEDQ  D:  05/31/2006  T:  06/01/2006  Job:  [id number]   cc:   [facility] Stanford Breed, MD, [facility], [place]

## 2010-08-27 NOTE — Cardiovascular Report (Signed)
[place]  Patient:    [name], [name] Visit Number: [id number] MRN: [id number]          Service Type: MED Location: 2000 2012 01 Attending Physician:  Tiburcio Pea Dictated by:   Christy Sartorius, M.D. [facility] Proc. Date: 05/09/01 Admit Date:  05/07/2001 Discharge Date: 05/09/2001   CC:         Nikki Dom, M.D., [facility]  Kirtland Bouchard, M.D.  Jerrel Ivory, M.D.   Cardiac Catheterization  PROCEDURES PERFORMED: 1. Left heart catheterization. 2. Left ventriculogram. 3. Selective coronary angiography.  DIAGNOSES: 1. Moderate coronary artery disease. 2. Normal left ventricular systolic function.  HISTORY:  [name] is a 65 year old black female with diabetes mellitus and multiple cardiac risk factors who presents with substernal and right arm discomfort.  This appeared to be exacerbated by exertion.  The patient had nonspecific ECG changes with T wave inversion in the inferolateral leads and she was admitted to the hospital where troponin enzymes were slightly elevated.  She presents for further cardiac assessment.  DESCRIPTION OF PROCEDURE:  Informed consent was obtained.  The patient was brought to the catheterization lab.  A #6 French sheath was placed in the right femoral artery.  Left heart catheterization, left ventriculogram, and selective coronary angiography was then performed in the usual fashion using preformed #6 French Judkins catheters.  An additional angiography of the right coronary artery was performed using #6 [place] special right catheter and intracoronary nitroglycerin.  At the termination of the case, catheters and sheath were removed and manual pressure applied until adequate hemostasis was achieved.  The patient tolerated the procedure well and was transferred to the floor in stable condition.  Findings are as follows:  FINDINGS: 1. Left main trunk:  Eccentric plaque of 20% in the  proximal segment. 2. LAD:  This begins as a large caliber vessel and provides a major first    diagonal branch in the proximal segment and several small diagonal    branches in the middle and distal segment.  The LAD has mild diffuse    disease in the middle and distal segments.  There is a focal concentric    narrowing of 50% in the mid section. 3. Left circumflex artery:  This is a medium caliber vessel that provides    four marginal branches.  The third marginal branch is the largest of the    vessels and provides the lateral wall.  There are mild irregularities of    20% in the AV circumflex. 4. Right coronary artery:  Dominant.  This is a small caliber vessel that    provides a post descending artery in its terminal segment.  The right    coronary artery has moderate disease of 20-30% in the mid section.    Spasm was noted in the proximal right coronary artery with engagement    using a #6 [place] JR4 catheter.  Repeat angiography and selective right    catheter and intracoronary nitroglycerin showed no critical disease.  LV:  Normal end systolic and end diastolic dimensions.  Overall left ventricular function is well preserved.  Ejection fraction of greater than 55%.  No mitral regurgitation.  LV pressure was 120/5.  Aortic was 120/70. LVEDP equals 14.  ASSESSMENT AND PLAN:  [name] is a 65 year old female with noncritical coronary disease and well preserved LV function.  Continued medical therapy will be pursued. Dictated by:   Christy Sartorius, M.D. [place] Attending Physician:  Tiburcio Pea DD:  05/09/01 TD:  05/09/01 Job: [id number]

## 2010-08-27 NOTE — Op Note (Signed)
[name], POLL                ACCOUNT NO.:  [id number]   MEDICAL RECORD NO.:  [id number]          PATIENT TYPE:  INP   LOCATION:  1615                         FACILITY:  [facility]   PHYSICIAN:  Odis Hollingshead, M.D.DATE OF BIRTH:  1946/03/22   DATE OF PROCEDURE:  DATE OF DISCHARGE:                                 OPERATIVE REPORT   PREOPERATIVE DIAGNOSIS:  Right colon cancer.   POSTOPERATIVE DIAGNOSIS:  Right colon cancer.   PROCEDURE:  Laparoscopic-assisted right colectomy.   SURGEON:  Odis Hollingshead, M.D.   ASSISTANT:  Orson Ape. [name], M.D.   ANESTHESIA:  General.   ESTIMATED BLOOD LOSS:  350 mL.   INDICATIONS:  This 65 year old female had been feeling weak, noted to be  anemic and had a little hematochezia.  She underwent a colonoscopy  demonstrating a lesion on the right colon which was adenocarcinoma. No  obvious metastatic disease was noted on her preoperative CT scan.  Preoperative CEA was 1.3. She now presents for elective laparoscopic-  assisted possible open colectomy.   TECHNIQUE:  She is seen in the holding area, brought to the operating room,  placed supine on the operating table, and general anesthetic was  administered.  A Foley catheter placed in the bladder. The abdomen was  sterilely prepped and draped. There was a previous small transverse  supraumbilical incision was re-incised through the skin and subcutaneous  tissue. The fascia was grasped. A small incision made in the fascia.  Peritoneal cavity was entered under direct vision. A pursestring suture of  zero Vicryl was placed around fascial edges. A Hassan trocar was induced to  the peritoneal cavity. Pneumoperitoneum created by insufflation of CO2 gas.   Next, the laparoscope was introduced. There were some omental adhesions  noted superiorly at the upper midline and lower midline, although very low  midline was cleared of omental adhesions and made a small incision through  previous  scar, inserted 10/11 mm trocar through this incision. I then  performed adhesiolysis of the upper abdominal wall mobilizing the omentum  free from this using harmonic scalpel. I was then able to put a 10/11 mm  trocar through a small epigastric incision.   I then directed my attention to the cecum which I began mobilizing, dividing  lateral attachments using harmonic scalpel. The distal ileum appeared to be  fairly affixed and was affixed down into the pelvis. I then just  concentrated on mobilizing the descending colon with harmonic scalpel up  toward the hepatic flexure. Once I felt I had some of this mobilized, I felt  that I was not going to be able to mobilize the terminal ileum. I  subsequently removed a Hassan trocar and made a transverse incision toward  the right side of the abdomen through all the skin, anterior fascia, rectus  muscle, posterior fascia. I ended up having to enlarge this incision.   I then directed my attention to the distal ileum.  There were dense  adhesions between the distal ileum, what appeared to be a fallopian tube and  bladder. I used sharp dissection to mobilize  these dense adhesions and  trying to mobilize the terminal ileum and bring the small bowel up into the  wound. Once I had this I was then able to further mobilize and medialize the  right colon using electrocautery and harmonic scalpel. I mobilized the  hepatic flexure and dissected the omentum free from the transverse colon. I  was able to palpate the tumor in the mid ascending colon. The ilium was then  divided proximally approximately 6-8 cm proximal to ileocecal valve. I then  divided the transverse colon at the proximal one-third distal, two  third  junction. The mesenteric vessels were then clamped, divided, and doubly  ligated and a wedge of mesentery as well as the part of the distal ileum and  right colon, and part of transverse colon were then handed off the field and  sent to  pathology.   Following this, I then performed a side-to-side stapled anastomosis between  the distal ileum and the transverse colon. The remaining enterotomy was  closed with a linear non cutting stapler and this part was oversewn with  interrupted 3-0 silk sutures. The distal anterior staple was reinforced with  a 3-0 silk suture. The anastomosis was patent, viable, under no tension. The  mesenteric defect was closed with interrupted 3-0 silk sutures.   Gloves were changed and irrigation was performed. During my dissection, I  had identified the ureter and kept the plane of dissection anterior to it.  It was once again inspected and intact without evidence of injury. I  irrigated with approximately 2 liters saline. I saw no evidence of leak from  the anastomosis and no bleeding. Irrigation fluid was evacuated. No liver  lesions were noted.   I then requested the needle, sponge, and instrument count, which was  reported to be correct. I then closed the fascia of the large transverse  incision in two layers, closing posterior layer with running #1 PDS suture,  the anterior layer with a running #1 PDS suture. Subcutaneous tissue was  irrigated.  Trocars were removed. All skin incisions were closed with  staples.  Sterile dressings were applied. She tolerated procedure without  apparent complications was taken to recovery room in satisfactory condition.      Odis Hollingshead, M.D.  Electronically Signed     TJR/MEDQ  D:  03/07/2005  T:  03/07/2005  Job:  [id number]   cc:   Tory Emerald. Benson Norway, MD  Fax: [phone]   Riverton Oneida Alar, M.D.  Fax: [phone]

## 2010-08-27 NOTE — Discharge Summary (Signed)
[name]. [facility]  Patient:    [name], [name] Visit Number: [id number] MRN: [id number]          Service Type: MED Location: 2000 2012 01 Attending Physician:  [name] Dictated by:   [name], M.D. Admit Date:  05/07/2001 Discharge Date: 05/09/2001   CC:         [name], M.D.  [name], M.D. [facility]  [name], M.D., [facility]   Discharge Summary  DATE OF BIRTH:  October 20, 1945  DISCHARGE MEDICATIONS: 1. Aspirin 325 mg q.d. 2. Altace 5 mg q.d. 3. Actos 15 mg q.d. 4. Acetaminophen 650 mg p.o. q.4-6h. p.r.n. pain at her incision site.  DISCHARGE INSTRUCTIONS:  She is instructed not to do any heavy lifting for a week.  She is to adhere to a low fat, low cholesterol diet. She is able to shower.  She has been given a follow up appointment to see Dr. Melford [name] at the [facility] at 9:20 a.m. on May 16, 2001.  DISCHARGE DIAGNOSES: 1. Chest pain. 2. Diabetes mellitus type 2. 3. Depression. 4. Hypertension.  HOSPITAL COURSE:  This is a 65 year old female with multiple cardiac risk factors who presented to the [facility] with 2-1/2 hour history of left sided chest pain which began while hanging up clothes at work.  She became diaphoretic, mildly short of breath and the pain radiated to her right arm.  The pain was relieved by rest but restarted when the patient went to the break room.  See admission H&P for admission physical exam and labs.  EKG on admission showed biphasic T waves in 3, aVF and V3 through V6 with no ST segment changes.  She was in normal sinus rhythm at the time.  #1 - CHEST PAIN:  Possible etiologies include cardiac, psychiatric and GI. For cardiac, the patient was ruled out by serial enzymes but her EKGs were abnormal and given her risk factors which include diabetes, hypertriglyceridemia, and hypertension a cardiology consult was obtained.  Dr. Caryl [name]  suggested a catheterization be done.  The catheterization was performed on May 09, 2001.  It showed an ejection fraction of 55%, 40 to 50% stenosis of the LAD, 20% stenosis of the circumflex and 20 to 30% stenosis of the right coronary artery and it was suggested that the patient had noncritical coronary artery disease and she undergo medical management.  They did not believe that the patients current chest pain is cardiac in nature on the basis of this catheterization.  Other possible etiologies:  She does have GERD, although she stated that this particular pain is nothing like her GI symptoms in the past.  The third possible etiology is psychiatric.  The patients son recently committed suicide and she stated that a number of her church members have been telling her that both she and her son are going to hell because of this.  This has had detrimental effects on her mental well being and could very well be the cause of her chest pain.  #2 - DIABETES MELLITUS:  The patients hemoglobin A1C 9 as an outpatient.  Her blood sugars in the hospital ranged from the 200s to the 400s.  The patient was kept on her Actos while in the hospital and a sliding scale was instituted with some efficacy.  #3 - PSYCHOLOGICAL:  The patients son recently committed suicide.  Her husband has a drug addiction problem.  She has a brother who  died of an accidental death and a half brother who died of an accidental death.  As stated before she is being told by members of her church that she and her son are going to hell because he committed suicide.  This is having a severely detrimental effect on her emotional state.  Information regarding her was given to Anne Baseman, PsyD and Dr. Elder [name] card was given to the patient in the hopes that she will contact Dr. Margart [name] for help in this matter.  #4 - HYPERTENSION:  The patients blood pressure was well maintained on Altace 5 mg q.d. She was placed on a beta  blocker, Atenolol 50 mg each day secondary to multiple cardiac risk factors and possible MI, although MI is ruled out. The patient was kept on beta blocker as she does have coronary artery disease although it is noncritical.  #5 - HYPERLIPIDEMIA:  The patient has a history of hypertriglyceridemia and a fasting lipid profile was obtained on May 08, 2001 which showed a total cholesterol of 159, triglyceride level of 209, HDL cholesterol 39, VLDL 42, LDL 78 with a total cholesterol/ACL ratio of 4.1 which puts her just under average risk for coronary heart disease.  SUGGESTED FOLLOW UP ITEMS: 1. Due to the patients noncritical coronary artery disease the patient was    discharged on aspirin, Altace and Atenolol.  Her blood pressure at    discharge was 109/55.  I believe it is a very good idea to keep Mrs.    [name] on a beta blocker and an ACE but if there is concern regarding    her blood pressure it might be a good idea to remove the beta blocker. 2. Diabetes.  There is a question as to why the patient was on Actos rather    than Metformin.  You might want to consider switching her to Metformin    to see if her blood sugars could be better controlled. 3. Psychological.  The patient is diagnosed with depressive disorder,    anxiety and I believe she also is having grief reaction at this point    in time and I think that she would highly benefit from meeting with    Dr. Margart [name].  If she has not called Dr. Margart [name] by the time of her follow    up visit it is suggested she be encouraged to do so. Dictated by:   [name], M.D. Attending Physician:  [name] DD:  05/09/01 TD:  05/10/01 Job: [id number]

## 2010-08-27 NOTE — Cardiovascular Report (Signed)
[name], [name]                ACCOUNT NO.:  [id number]   MEDICAL RECORD NO.:  [id number]          PATIENT TYPE:  OIB   LOCATION:  2899                         FACILITY:  [facility]   PHYSICIAN:  Eustace Quail, M.D. [facility] DATE OF BIRTH:  1946/03/12   DATE OF PROCEDURE:  03/31/2004  DATE OF DISCHARGE:  03/31/2004                              CARDIAC CATHETERIZATION   CLINICAL HISTORY:  [name] is 65 years old and has a history of  hypertension, diabetes, and hyperlipidemia.  She had a catheterization done  in 2003 which showed nonobstructive disease.  Recently has developed chest  pain with exertion and had a stress test performed in family practice and  was only able to go a few minutes before she had to stop due to a  hypertensive response and chest pain.  She was seen in consultation by Dr.  Pierre Bali and scheduled for catheterization today.   PROCEDURE:  The procedure was performed via the right femoral artery using  arterial sheath and 6 French preformed coronary catheters. A front wall  arterial puncture was performed and Omnipaque contrast was used.  A distal  aortogram was performed to rule out renal vascular causes for hypertension.  Right femoral artery was closed with Angio-Seal at the end of the procedure.  The patient tolerated the procedure well and left the laboratory in  satisfactory condition.   RESULTS:  Aortic pressure was 185/93 with mean of 132.  Left ventricular  pressure 185/14.   Left main coronary artery:  The left main coronary was free of significant  disease.   Left anterior descending artery:  The left anterior descending artery gave  rise to two septal perforators and three diagonal branches.  There was 30%  narrowing in the proximal LAD and there were irregularities in the proximal  and mid LAD.   Circumflex artery:  The circumflex artery gave rise to a first marginal  branch, second smaller marginal branch, and two posterior lateral  branches.  There is 50% narrowing in the first marginal branch near its ostium and  there were irregularities in the mid circumflex artery.   Right coronary artery:  The right coronary artery is a small co-dominant  vessel that gave rise to a right ventricular branch and a posterior  descending.  There are irregularities in this vessel, but no significant  obstruction.   LEFT VENTRICULOGRAM:  The left ventriculogram performed in the RAO  projection showed good wall motion with no areas of hypokinesis.  The  estimated ejection fraction was 60%.   DISTAL AORTOGRAM:  Distal aortogram was performed showed patent renal  arteries and no significant aortoiliac obstruction.   CONCLUSION:  Nonobstructive coronary artery disease with 30% narrowing in  the proximal left anterior descending artery, 50% narrowing in the first  marginal branch of the circumflex artery, irregularities in the right  coronary artery and normal left ventricular function.   RECOMMENDATIONS:  In view of these findings, I think it is not likely that  the patient's symptoms are related to cardiac etiology.  She does have  hypertensive disease and I  think emphasis on control of her hypertension.  Will plan to resume her Metformin tomorrow and treat her with aspirin for  nonobstructive disease and arrange followup with Dr. Etheleen Sia for further  adjustment in her blood pressure medicines.       BB/MEDQ  D:  03/31/2004  T:  04/01/2004  Job:  [id number]   cc:   Dr. Hector Shade at [facility]   Dr. Darlin Priestly

## 2010-08-27 NOTE — H&P (Signed)
[name], [name]                ACCOUNT NO.:  [id number]   MEDICAL RECORD NO.:  [id number]          PATIENT TYPE:  AMB   LOCATION:  SDS                          FACILITY:  [place]   PHYSICIAN:  Hortencia Pilar, M.D.    DATE OF BIRTH:  11-05-1945   DATE OF ADMISSION:  05/31/2006  DATE OF DISCHARGE:                              HISTORY & PHYSICAL   HISTORY OF PRESENT ILLNESS:  The patient is a 65 year old female with a  history of diabetes and coronary artery disease who comes in with left-  sided chest pain.  She describes this pressure that radiates down her  left arm.  She also complains of tingling in the arm.  The pain comes  and goes and is made worse with exertion and stress.  It is better with  rest.  It is associated with nausea, diaphoresis, and shortness of  breath.  She ran out of her meds a few months ago as she has no  insurance.  She has complained of dark tarry stools for 2 weeks, pain  with eating, and diffuse abdominal pain.  Some bright red blood per  rectum.  Review of systems also positive for diarrhea, which is chronic.  Of note in her past, she had a cath December 2005 that showed no signs  of acute disease.   PAST MEDICAL HISTORY:  1. Diabetes type 2, uncomplicated.  2. Hypertriglyceridemia.  3. Anxiety.  4. Depression.  5. Hypertension.  6. Coronary artery disease.  7. Hemorrhoids.  8. Gastroesophageal reflux disease.  9. Colon cancer with history of iron deficiency anemia.   CURRENT MEDICATIONS:  None, as patient has run out of medicines and has  not been able to afford them.  In the past she had been on:  1. Toprol XL.  2. Lantus 20 b.i.d.  3. Glucotrol 10 mg b.i.d.  4. Ativan 1 mg p.o. b.i.d.  5. Atacand 32/12.5.   ALLERGIES:  AN ACE INHIBITOR CAUSED HER THROAT TO FEEL LIKE IT WAS  CLOSING.   OTHER DOCTORS:  1. GI, John C. Amedeo Plenty, M.D.  2. Cardiology, Dr. Ron Parker with [facility].   SOCIAL HISTORY:  The patient is a nonsmoker.  Denies drinking and  alcohol.  She works at [employer].  Her son comitted  suicide in April a few years ago.   FAMILY HISTORY:  Dad with diabetes, dialysis, hypertension.  No heart  disease per the patient.  Mom left at 26 years old.  Son comitted  suicide.   PHYSICAL EXAMINATION:  VITAL SIGNS:  195/85, pulse 74, temperature 98.4.  GENERAL:  The patient is in no apparent distress.  Alert and oriented  x3.  HEENT:  Head is atraumatic, normocephalic.  Moist mucous membranes.  Extraocular muscles are intact.  Oropharynx clear.  CARDIOVASCULAR:  Regular rate and rhythm.  No murmurs.  NECK:  Carotids without bruits.  LUNGS:  Clear to auscultation bilaterally.  ABDOMEN:  Positive bowel sounds, soft, no rebound or guarding.  Tender  to palpation at the left side and mid epigastrium.  EXTREMITIES:  No clubbing, cyanosis, or edema,  2+ pedal pulses  bilaterally.  RECTAL:  Exam positive for hemorrhoids.  No stool in vault.   LABORATORY DATA:  Heme negative.  HbA1c equals 9.2.   EKG showed depressed T wave in the inferior leads, but this is unchanged  from 2001.   ASSESSMENT:  1. Chest pain.  Likely unstable angina versus acute coronary syndrome.      We did an electrocardiogram, we will admit patient.  Check cardiac      enzymes every 8 x3.  Check a TSH and a comprehensive metabolic      profile.  We will hold aspirin secondary to tarry stools.      Nitroglycerin as needed.  Call [facility].  No angiotensin-      converting enzymes or angiotensin receptor blocker secondary to      allergy.  We will start a beta-blocker.  2. Tarry stools.  Check kidneys, ureters, and bladder imaging with      tenderness to palpation, hemoccult stools.  We will check      Helicobacter pylori.  May need to consult oncologist.  No      gastroenterology consult yet unless patient's stools turn out to be      hemoccult positive or she has a low red blood cell count.  3. Diabetes.  Sliding scale insulin with Lantus.  We  will give half      the past dose given that we are unsure of patient's current needs.      Hemoglobin A1c today in clinic was 9.2.  No angiotensin-converting      enzymes or angiotensin receptor blocker secondary to allergy.  4. Hypertension.  Carvedilol 6.25 mg by mouth twice a day.      Hypertensive urgency, no emergency.  May need a higher dose, but we      will try to keep on the $4 list.      Hortencia Pilar, M.D.     Margie Ege  D:  05/31/2006  T:  06/01/2006  Job:  [id number]

## 2010-08-27 NOTE — Consult Note (Signed)
[facility]  Patient:    [name], [name]                       MRN: [id number] Proc. Date: 11/23/99 Adm. Date:  [id number] Attending:  Rolland Porter CC:         [place]   Consultation Report  REASON FOR CONSULTATION:  The patient called my nurse stating that she had been spitting up blood or possibly vomiting up blood today and noted dark stools and dizziness, and was referred to the emergency room.  I have known the patient for some time, and she has had chronic dyspeptic complaints centered around chest discomfort, atypical dysphagia, atypical reflux symptoms and diffuse abdominal pain.  She has had empiric response to dilatation in the past in terms of dysphagia, but continues to have dyspeptic symptoms. Although her symptoms were not typical of reflux, she had an abnormal 24 hour pH study with fairly good symptom correlation.  She ultimately underwent antireflux surgery, which seemed to help her for a while.  She has had heme positive stools on several occasions over the years, and was recently sent to me for this this year.  Colonoscopy was performed, which showed a single adenomatous colon polyp and no other abnormalities.  She states that her stools have been dark since then, but not maroon or bloody.  Today, she states that she either spit up or coughed up some blood, but did not describe true vomiting.  She is complaining of frequent epigastric periumbilical chest discomfort and dysphagia, particularly from pills.  PHYSICAL EXAMINATION:  VITAL SIGNS:  Blood pressure 141/82, pulse 69 lying.  Blood pressure 159/93 with pulse 85 standing.  HEENT:  Unremarkable.  HEART:  Regular rate and rhythm without murmur.  LUNGS:  Clear.  ABDOMEN:  Soft.  Nondistended.  Normoactive bowel sounds.  No hepatosplenomegaly, mass or guarding.  RECTAL:  Only a small amount of medium brown stool which is heme negative.  LABORATORY DATA:    Hemoglobin normal at 13.  Glucose elevated at 336.  BUN and creatinine normal.  IMPRESSION: 1. Continued atypical chest pain, epigastric pain and dysphagia status post    Nissen fundoplication in [id number]. 2. Reported spitting or coughing up blood, which does not sound likely to    represent true hematemesis. 3. Dark stools.  Heme negative on exam, denies the use of Pepto-Bismol or    iron.  PLAN: 1. Will release to outpatient workup including barium swallow and barium    tablet as well as gallbladder ultrasound. 2. Will repeat Hemoccults. 3. Empiric trial of Nexium. 4. The patient is to call if she vomits or spits up any more bright red blood    and will reconsider barium swallow in favor of possible EGD. DD:  11/23/99 TD:  11/24/99 Job: [id number]

## 2010-08-27 NOTE — Consult Note (Signed)
NAMEDELAINEY, MCCULLEY                          ACCOUNT NO.:  [id number]   MEDICAL RECORD NO.:  [id number]                   PATIENT TYPE:  INP   LOCATION:  2028                                 FACILITY:  [facility]   PHYSICIAN:  Kirk Ruths, M.D.                DATE OF BIRTH:  05/17/1945   DATE OF CONSULTATION:  02/22/2003  DATE OF DISCHARGE:                                   CONSULTATION   Mrs. Rockwood is a 65 year old female with past medical history of diabetes  mellitus, hypertension, and hyperlipidemia who presents with chest pain.  The patient underwent cardiac catheterization on May 09, 2001 secondary  to chest pain.  At that time, she was found to have a 20% left main, 50% mid  LAD, and a 30% mid right coronary artery.  Her ejection fraction was 55%.  Medical therapy was recommended.  Over the past several months, she has  complained of occasional chest pain.  She was admitted on February 21, 2003  with complaints of weakness, unsteadiness with ambulation, leg pain and  chest pain.  The chest pain is in the substernal area.  It does not radiate.  There is associated shortness of breath, but there is no nausea, vomiting.  She does state there was diaphoresis.  The pain is not clearly exertional  although it can increase with exertion.  It is not pleuritic or related to  food.  It can increase with lying flat.  It typically lasts 15 minutes and  resolves spontaneously.  Because of these symptoms, we were asked to further  evaluate.  Of note, the patient also had dyspnea on exertion.   PAST MEDICAL HISTORY:  Significant for diabetes mellitus, hypertension,  hyperlipidemia.  She does not have history of myocardial infarction although  she does have minor coronary artery disease as described in the HPI.  She  has history of gastroesophageal reflux disease and is status post  fundoplication.  She also has a history of anxiety and depression by report.  She has history of  hemorrhoids.  She has had a prior hysterectomy and  bladder tack.   MEDICATIONS:  1. Altace 5 mg daily.  2. Aspirin 325 mg p.o. daily.  3. Esterase.  4. Glipizide.  5. Insulin.  6. Metformin.  7. Toprol 25 mg p.o. daily.  8. Sublingual nitroglycerin as needed.   She has no known drug allergies.   SOCIAL HISTORY:  She does not smoke, nor does she consume alcohol.   FAMILY HISTORY:  Negative for coronary artery disease.  She does state that  her father is on dialysis.   REVIEW OF SYSTEMS:  There is no headaches, fever or chills.  There is no  productive cough or hemoptysis.  She does have mild dysphasia in the upper  pharynx.  There is no odynophagia.  There is no melena or hematochezia  noted.  There is no  orthopnea or PND.  There is no pedal edema.  She does  complain of occasional numbness and pain in her lower extremities.   Her electrocardiogram shows a normal sinus rhythm at a rate of 74.  There is  inferolateral T wave changes.  It is unchanged from previous.  Her  enzymes  are negative.    DIAGNOSES:  1. Atypical chest pain.  2. Diabetes mellitus.  3. Hyperlipidemia.  4. Hypertension.  5. History of anxiety and depression.  6. History of gastroesophageal reflux disease status post fundoplication.   PLAN:  Mrs. Mohrman presents with chest pain of uncertain etiology.  Her  cardiac catheterization in January 2003 revealed nonobstructive disease.  Her symptoms are also atypical.  However, she does have multiple risk  factors and we will plan to risk stratify with a stress Cardiolite.  If it  shows significant ischemia, then we would recommend proceeding with cardiac  catheterization.  We will otherwise continue with her present medications.  Of note, her diabetes mellitus is a coronary artery disease equivalent.  She  would therefore benefit from a statin long term.  We will make further  recommendations once we have the results of her nuclear study.                                                Kirk Ruths, M.D.    BC/MEDQ  D:  02/22/2003  T:  02/22/2003  Job:  [id number]

## 2010-08-27 NOTE — H&P (Signed)
[name], [name]                ACCOUNT NO.:  [id number]   MEDICAL RECORD NO.:  [id number]          PATIENT TYPE:  INP   LOCATION:  0004                         FACILITY:  [facility]   PHYSICIAN:  Odis Hollingshead, M.D.DATE OF BIRTH:  20-Mar-1946   DATE OF ADMISSION:  03/07/2005  DATE OF DISCHARGE:                                HISTORY & PHYSICAL   REASON FOR ADMISSION:  Elective right colectomy.   HISTORY OF PRESENT ILLNESS:  [name] is a 65 year old female who was  feeling weak, anorexic, and had anemia. 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 CT scan was negative  for metastatic disease. She is now admitted for elective laparoscopic-  assisted right colectomy.   PAST MEDICAL HISTORY:  1.  Hypertension.  2.  Gastroesophageal reflux disease.  3.  Type 2 diabetes mellitus.  4.  Hyperlipidemia.  5.  Intermittent anxiety.  6.  Gastritis.   PREVIOUS OPERATIONS:  1.  Hysterectomy.  2.  Laparoscopic Nissan fundoplication.  3.  Bladder suspension.   ALLERGIES:  None.   MEDICATIONS:  1.  Toprol-XL.  2.  Atacand.  3.  Avandamet.   SOCIAL HISTORY:  She is married. Denies tobacco or alcohol use.   FAMILY HISTORY:  Positive for diabetes.   REVIEW OF SYSTEMS:  Notable for having a cardiac catheterization in 2005 and  recent stress test, both of these which did not show any significant cardiac  disease.   PHYSICAL EXAMINATION:  GENERAL:  A well-developed, well-nourished female in  no acute distress. Pleasant and cooperative.  VITAL SIGNS:  Temperature is 96.8, her blood pressure is 160/82, her pulse  is 72.  EYES:  Extraocular motions intact, no icterus.  NECK:  Supple without masses.  RESPIRATORY:  Breath sounds equal and clear, respirations unlabored.  CARDIOVASCULAR:  Heart demonstrates a regular rate and rhythm.  ABDOMEN:  Soft with lower midline scar and small upper midline scars. No  obvious hernias. No palpable  masses or organomegaly.  EXTREMITIES:  No cyanosis or edema.   IMPRESSION:  Right colon cancer.   PLAN:  Laparoscopic-assisted right colectomy. We have discussed the  procedure and the risks preoperatively.      Odis Hollingshead, M.D.  Electronically Signed     TJR/MEDQ  D:  03/07/2005  T:  03/07/2005  Job:  [id number]

## 2010-08-27 NOTE — Consult Note (Signed)
   [name], [name]                          ACCOUNT NO.:  [id number]   MEDICAL RECORD NO.:  [id number]                   PATIENT TYPE:  INP   LOCATION:  2028                                 FACILITY:  [facility]   PHYSICIAN:  Kirk Ruths, M.D.                DATE OF BIRTH:  1946-01-28   DATE OF CONSULTATION:  02/22/2003  DATE OF DISCHARGE:                                   CONSULTATION   ADDENDUM:  Please add prior to Problem List:   PHYSICAL EXAMINATION:  VITAL SIGNS: Blood pressure today is 100/51, pulse  58, respiratory rate 20.  GENERAL:  She is well developed and somewhat obese.  She is in no acute  distress.  She does not appear to be depressed at present.  There is no  peripheral clubbing.  SKIN:  Warm and dry.  HEENT:  Unremarkable with normal eyelids.  NECK:  Supple and normal bilaterally.  No bruits noted.  No jugular venous  distention and no thyromegaly noted.  CHEST:  Clear to auscultation.  Normal expansion.  CARDIOVASCULAR:  Regular rate and rhythm with normal S1 and S2.  There were  no murmurs, rubs, or gallops noted.  ABDOMEN:  Nontender, nondistended.  Positive bowel sounds.  No  hepatosplenomegaly and no masses appreciated.  There is no abdominal bruit.  EXTREMITIES:  She has 2+ femoral pulses and no bruit.  Extremities show now  edema, and I can palpate no cords.  She has 2+ dorsalis pedis pulses  bilaterally.  NEUROLOGIC:  Grossly intact.                                               Kirk Ruths, M.D.    BC/MEDQ  D:  02/22/2003  T:  02/22/2003  Job:  [id number]

## 2010-09-23 ENCOUNTER — Inpatient Hospital Stay (HOSPITAL_COMMUNITY): Payer: Medicare Other

## 2010-09-23 ENCOUNTER — Ambulatory Visit (HOSPITAL_COMMUNITY)
Admission: RE | Admit: 2010-09-23 | Discharge: 2010-09-23 | Disposition: A | Discharge status: Home / Self Care | Payer: Medicare Other | Source: Ambulatory Visit

## 2010-09-23 ENCOUNTER — Encounter: Payer: Self-pay | Admitting: Family Medicine

## 2010-09-23 ENCOUNTER — Observation Stay (HOSPITAL_COMMUNITY)
Admission: AD | Admit: 2010-09-23 | Discharge: 2010-09-24 | Disposition: A | Discharge status: Home / Self Care | Payer: Medicare Other | Source: Ambulatory Visit | Attending: Family Medicine | Admitting: Family Medicine

## 2010-09-23 ENCOUNTER — Ambulatory Visit (INDEPENDENT_AMBULATORY_CARE_PROVIDER_SITE_OTHER): Payer: Medicare Other | Admitting: Family Medicine

## 2010-09-23 DIAGNOSIS — R42 Dizziness and giddiness: Secondary | ICD-10-CM | POA: Insufficient documentation

## 2010-09-23 DIAGNOSIS — R0789 Other chest pain: Principal | ICD-10-CM | POA: Insufficient documentation

## 2010-09-23 DIAGNOSIS — Z9049 Acquired absence of other specified parts of digestive tract: Secondary | ICD-10-CM | POA: Insufficient documentation

## 2010-09-23 DIAGNOSIS — I10 Essential (primary) hypertension: Secondary | ICD-10-CM

## 2010-09-23 DIAGNOSIS — Z9181 History of falling: Secondary | ICD-10-CM | POA: Insufficient documentation

## 2010-09-23 DIAGNOSIS — E1165 Type 2 diabetes mellitus with hyperglycemia: Secondary | ICD-10-CM

## 2010-09-23 DIAGNOSIS — E1149 Type 2 diabetes mellitus with other diabetic neurological complication: Secondary | ICD-10-CM

## 2010-09-23 DIAGNOSIS — R079 Chest pain, unspecified: Secondary | ICD-10-CM | POA: Insufficient documentation

## 2010-09-23 DIAGNOSIS — Z85038 Personal history of other malignant neoplasm of large intestine: Secondary | ICD-10-CM | POA: Insufficient documentation

## 2010-09-23 DIAGNOSIS — Z79899 Other long term (current) drug therapy: Secondary | ICD-10-CM | POA: Insufficient documentation

## 2010-09-23 DIAGNOSIS — E119 Type 2 diabetes mellitus without complications: Secondary | ICD-10-CM | POA: Insufficient documentation

## 2010-09-23 DIAGNOSIS — IMO0001 Reserved for inherently not codable concepts without codable children: Secondary | ICD-10-CM

## 2010-09-23 DIAGNOSIS — H8110 Benign paroxysmal vertigo, unspecified ear: Secondary | ICD-10-CM

## 2010-09-23 DIAGNOSIS — Z23 Encounter for immunization: Secondary | ICD-10-CM | POA: Insufficient documentation

## 2010-09-23 DIAGNOSIS — IMO0002 Reserved for concepts with insufficient information to code with codable children: Secondary | ICD-10-CM

## 2010-09-23 DIAGNOSIS — D320 Benign neoplasm of cerebral meninges: Secondary | ICD-10-CM | POA: Insufficient documentation

## 2010-09-23 LAB — COMPREHENSIVE METABOLIC PANEL
ALT: 9 U/L (ref 0–35)
AST: 11 U/L (ref 0–37)
Albumin: 3.3 g/dL — ABNORMAL LOW (ref 3.5–5.2)
Alkaline Phosphatase: 90 U/L (ref 39–117)
BUN: 16 mg/dL (ref 6–23)
CO2: 27 mEq/L (ref 19–32)
Calcium: 9.5 mg/dL (ref 8.4–10.5)
Chloride: 102 mEq/L (ref 96–112)
Creatinine, Ser: 0.71 mg/dL (ref 0.4–1.2)
GFR calc Af Amer: >60 mL/min (ref >60)
GFR calc non Af Amer: >60 mL/min (ref >60)
Glucose, Bld: 227 mg/dL — ABNORMAL HIGH (ref 70–99)
Potassium: 3.3 mEq/L — ABNORMAL LOW (ref 3.5–5.1)
Sodium: 139 mEq/L (ref 135–145)
Total Bilirubin: 0.4 mg/dL (ref 0.3–1.2)
Total Protein: 6.7 g/dL (ref 6.0–8.3)

## 2010-09-23 LAB — CARDIAC PANEL(CRET KIN+CKTOT+MB+TROPI)
CK, MB: 1.8 ng/mL (ref 0.3–4.0)
Relative Index: INVALID (ref 0.0–2.5)
Total CK: 74 U/L (ref 7–177)
Troponin I: <0.3 ng/mL (ref <0.30)

## 2010-09-23 LAB — CBC
HCT: 39.1 % (ref 36.0–46.0)
Hemoglobin: 13.5 g/dL (ref 12.0–15.0)
MCH: 29 pg (ref 26.0–34.0)
MCHC: 34.5 g/dL (ref 30.0–36.0)
MCV: 84.1 fL (ref 78.0–100.0)
Platelets: 227 10*3/uL (ref 150–400)
RBC: 4.65 MIL/uL (ref 3.87–5.11)
RDW: 12.7 % (ref 11.5–15.5)
WBC: 8.8 10*3/uL (ref 4.0–10.5)

## 2010-09-23 LAB — HEMOGLOBIN A1C
Hgb A1c MFr Bld: 10.8 % — ABNORMAL HIGH (ref <5.7)
Mean Plasma Glucose: 263 mg/dL — ABNORMAL HIGH (ref <117)

## 2010-09-23 LAB — GLUCOSE, CAPILLARY
Glucose-Capillary: 226 mg/dL — ABNORMAL HIGH (ref 70–99)
Glucose-Capillary: 305 mg/dL — ABNORMAL HIGH (ref 70–99)

## 2010-09-23 LAB — TSH: TSH: 1.085 u[IU]/mL (ref 0.350–4.500)

## 2010-09-23 MED ORDER — GADOBENATE DIMEGLUMINE 529 MG/ML IV SOLN
18.0000 mL | Freq: Once | INTRAVENOUS | Status: AC
  Administered 2010-09-23: 18 mL via INTRAVENOUS

## 2010-09-23 NOTE — Progress Notes (Signed)
  Subjective:    Patient ID: [name], female    DOB: [birth year], 65 y.o.   MRN: [id number]  HPI Vertigo- feels like this getting worse, balance getting worse, had fallen 3 times in past month , has been feeling faint especially in church    Feels like she is spinning , note had been doing okay until a few weeks ago, typically with any movements gets this sensation    No seizure activity, has been a little forgetful lately    Has not taken valium recently, this makes her sleepy - lives with husband (who had partial blindness and difficulty hearing) Note had Menigioma that has not been followed for some time approx 2 years last MRI    Diabetes- takes blood sugar- once a day, blood sugars running in mid 200's -- Lantus 10 units     Last night had chest pain on left side and this morning- sharp pain, non radiating lasted 30 minutes, felt diaphoretic, + nausea, little SOB, no diaphoresis Last night was resting when this occurred Last night took a nitroglycerin    Last A1C  8.9% 2011    HTN-  BP runn systolic Q000111Q    Taking all medications   Review of Systems - See above, no recent illness      Objective:   Physical Exam       Gen- NAD, alert and oriented    HEENT- PERRL, EOMI, no nystagmus, MMM    Neck- no JVD    CVS- RRR, no murmur    RESP- CTAB    EXT- no edema   Neuro- Orientation in tact in tact, CN- II-XII grossly in tact, some past pointing with finger to nose, feels dizzy when looking downward on gaze, sensation in tact, ataxic gait- wide based, +rhomberg       EKG- Sinus rhythem, U waves V1-V4, II  Assessment & Plan:   Lying - 167/76 , Standing 174/78   1. Chest pain- Concern for acute coronary syndrome, pt has has many co-morbidities and risk factors that are not well controlled , also has history of non obstructive CAD via Cath in 2008. Currently chest pain free, will admit to Telemetry, cycle CE,CXR check TSH, repeat EKG in AM, EKG concerning for U waves and  abnormal T waves, check lytes. If pt has recurrent pain would consult cardiology She is a diabetic and can have atypical presentation, Continue BB and ASA  2. Dizziness- pt has chronic vertigo however gait imbalance has worsened, concern this may be secondary to growth of Meningioma, vs multifactorial from vertigo, diabetes, HTN. Will obtain MRI of brain, consult her Neurosurgeon if needed. Would avoid the valium in this pt as she has had multiple falls  3. DM- poorly controlled, check A1C, needs titration of diabetic meds, specifically Insulin, unsure if she can tolerate Metformin  4. HTN- Elevated BP, but not orthostatic in clinic, hold ACEI secondary to possible intervention, continue other home meds, check potassium level  5. Colon Cancer- per report surveillance has been normal  6. FEN/GI - diabetic diet, check lytes, SLIV  7. Prophylaxis- Heparin sub q 8 hrs  8. Dispo- pending work-up, may need Vestibular PT if work-up normal or Neuro referral.    I interviewed and examined [name] and discussed her admission plan with [name]. Anne Jupiter A. Walker Kehr, MD   Daughter - [name] [phone]

## 2010-09-24 ENCOUNTER — Other Ambulatory Visit: Payer: Self-pay | Admitting: Family Medicine

## 2010-09-24 LAB — CARDIAC PANEL(CRET KIN+CKTOT+MB+TROPI)
CK, MB: 1.9 ng/mL (ref 0.3–4.0)
Relative Index: INVALID (ref 0.0–2.5)
Total CK: 63 U/L (ref 7–177)
Troponin I: <0.3 ng/mL (ref <0.30)

## 2010-09-24 LAB — BASIC METABOLIC PANEL
BUN: 11 mg/dL (ref 6–23)
CO2: 27 mEq/L (ref 19–32)
Calcium: 8.8 mg/dL (ref 8.4–10.5)
Chloride: 106 mEq/L (ref 96–112)
Creatinine, Ser: 0.47 mg/dL — ABNORMAL LOW (ref 0.50–1.10)
GFR calc Af Amer: >60 mL/min (ref >60)
GFR calc non Af Amer: >60 mL/min (ref >60)
Glucose, Bld: 159 mg/dL — ABNORMAL HIGH (ref 70–99)
Potassium: 3.5 mEq/L (ref 3.5–5.1)
Sodium: 140 mEq/L (ref 135–145)

## 2010-09-24 LAB — GLUCOSE, CAPILLARY
Glucose-Capillary: 114 mg/dL — ABNORMAL HIGH (ref 70–99)
Glucose-Capillary: 243 mg/dL — ABNORMAL HIGH (ref 70–99)

## 2010-09-24 MED ORDER — LISINOPRIL 5 MG PO TABS: 5.0000 mg | ORAL_TABLET | Freq: Every day | ORAL | Status: DC

## 2010-09-27 LAB — GLUCOSE, CAPILLARY: Glucose-Capillary: 158 mg/dL — ABNORMAL HIGH (ref 70–99)

## 2010-09-27 NOTE — Discharge Summary (Signed)
  NAMEDEAISA, HOSAKA                ACCOUNT NO.:  [id number]  MEDICAL RECORD NO.:  [id number]  LOCATION:  [id number]                         FACILITY:  [facility]  PHYSICIAN:  Blane Ohara Taegen Lennox, M.D.DATE OF BIRTH:  1946/04/01  DATE OF ADMISSION:  09/23/2010 DATE OF DISCHARGE:  09/24/2010                              DISCHARGE SUMMARY   ADDENDUM  DISCHARGE MEDICATIONS: 1. Amlodipine 10 mg daily. 2. Carvedilol 12.5 mg by mouth twice a day. 3. Insulin glargine which is Lantus 10 mg subcutaneous at bedtime. 4. Lisinopril 5 mg daily. 5. Nitroglycerin 0.4 mg p.r.n. 6. Tylenol as needed for pain. 7. Aspirin 81 mg daily. 8. Glipizide 10 mg daily.  DISCONTINUED MEDICATIONS: 1. Lisinopril/hydrochlorothiazide 10/12.5. 2. Carvedilol 25.     Lynne Leader, MD   ______________________________ Blane Ohara Zacchary Pompei, M.D.    EC/MEDQ  D:  09/24/2010  T:  09/25/2010  Job:  [id number]  Electronically Signed by Lynne Leader  on 09/27/2010 09:20:22 AM Electronically Signed by Lissa Morales M.D. on 09/27/2010 12:39:52 PM

## 2010-09-29 ENCOUNTER — Inpatient Hospital Stay: Payer: Medicare Other | Admitting: Family Medicine

## 2010-09-30 NOTE — Discharge Summary (Signed)
NAMESIANNE, [name]                ACCOUNT NO.:  [id number]  MEDICAL RECORD NO.:  [id number]  LOCATION:  [id number]                         FACILITY:  [facility]  PHYSICIAN:  Blane Ohara Jalah Warmuth, M.D.DATE OF BIRTH:  12-16-1945  DATE OF ADMISSION:  09/23/2010 DATE OF DISCHARGE:  09/24/2010                              DISCHARGE SUMMARY   PRIMARY CARE PROVIDER:  Lynne Leader, MD at [facility].  DISCHARGE DIAGNOSES: 1. Chest pain, noncardiac. 2. Chronic vertigo. 3. Meningioma, stable. 4. Hypertension. 5. Type 2 diabetes.  DISCHARGE MEDICATIONS: 1. Amlodipine 10 mg p.o. daily. 2. Carvedilol 12.5 mg p.o. b.i.d. with meals. 3. Lantus 10 units subcu at bedtime. 4. Lisinopril 20 mg p.o. daily. 5. Nitroglycerin 0.4 mg sublingual q.5 minutes p.r.n. up to 3 doses. 6. Tylenol 650 mg p.o. q.6 h. p.r.n. 7. Aspirin 81 mg p.o. daily. 8. Glipizide 10 mg p.o. b.i.d.  Medications which were stopped during this hospitalization: 1. Lisinopril/hydrochlorothiazide 20/25 p.o. daily. 2. Carvedilol 25 mg p.o. b.i.d.  CONSULTS:  None.  PROCEDURES: 1. MRI of brain with and without on September 24, 2010, showing chronic     small vessel disease affecting hemispheric white matter, not     significantly changed compared to previous study, right parafalcine     meningioma in frontal region measuring 8 x 8 x 9 mm.  This is only     1 mm or so larger than seen previously approximately year and half     ago.  No significant mass effect upon brain. 2. Chest x-ray on September 23, 2010, showed no acute cardiopulmonary     disease.  LABORATORY DATA:  Cardiac enzymes were cycled, were negative x2. Hemoglobin A1c 10.8.  TSH 1.085.  CBC unremarkable.  CMET remarkable only for low potassium 3.3 on admission, had improved to 3.5 on the day of discharge, creatinine 0.47.  BRIEF HOSPITAL COURSE:  This is a 65 year old female with known meningioma and chronic vertigo presenting with worsening vertigo and chest  pain. 1. Chest pain.  Cardiac enzymes were cycled and were negative x2.  The     patient was risk stratified with normal TSH elevated A1c with no     diagnosis of diabetes.  The patient was continued on her home     aspirin and given sublingual nitro to go home for chest pain.  It     was felt that this was most likely not a noncardiac cause of chest     pain.  The patient will need an outpatient echocardiogram.  PCP     will set this up at followup. 2. Vertigo.  This is a chronic issue, does not appear that meningioma     has increased in size or is having any mass effect on the brain to     cause worsening vertigo.  There is no current need for Neurology     and Neurosurgery to being to become involved acutely.  We will     refer the patient to vestibular rehab as an outpatient for     assistance with dealing with this.  Neuro exam remained relatively     normal throughout hospitalization. 3. Hypertension.  Blood pressure is normotensive at the time of     discharge.  The patient was continued on her home Norvasc, Coreg     was decreased from 25-12.5 mg b.i.d. as the patient's heart rate,     and the patient's heart rate was slightly bradycardic in the 50s.     In addition, the patient was restarted on her home lisinopril dose.     The patient's hydrochlorothiazide was held due to mild hypokalemia.     On admission, BMET could be checked as an outpatient at followup,     and hydrochlorothiazide restarted if blood pressures were still     elevated. 4. Type 2 diabetes.  The patient was continued on Lantus and sliding     scale insulin.  Blood sugars were elevated in the 200-300.  A1c was     significantly elevated at 10.8.  The patient will need tighter     diabetic control as an outpatient.  DISCHARGE INSTRUCTIONS:  The patient was instructed to increase activity slowly and walk with assistance.  The patient had been seen by physical therapy prior to discharge.  The patient was also  told to continue on a low-sodium heart-healthy diet and that informed that she will need an outpatient echocardiogram.  FOLLOWUP APPOINTMENTS: 1. [facility] with Dr. Buelah Manis on Wednesday September 29, 2010, at 10:00 a.m. 2. [facility] [place].  The patient is to call and make an     appointment.  DISCHARGE CONDITION:  The patient was discharged home in stable medical condition.    ______________________________ Anne Glass, MD   ______________________________ Blane Ohara Nillie Bartolotta, M.D.    JM/MEDQ  D:  09/24/2010  T:  09/25/2010  Job:  [id number]  cc:   Lynne Leader, MD  Electronically Signed by Anne Glass MD on 09/28/2010 10:33:02 AM Electronically Signed by Lissa Morales M.D. on 09/30/2010 03:42:40 PM

## 2010-10-14 NOTE — H&P (Signed)
Anne, [name]                ACCOUNT NO.:  [id number]  MEDICAL RECORD NO.:  [id number]  LOCATION:  [id number]                         FACILITY:  [facility]  PHYSICIAN:  [place] A. Walker Kehr, M.D.    DATE OF BIRTH:  05-20-1945  DATE OF ADMISSION:  09/23/2010 DATE OF DISCHARGE:                             HISTORY & PHYSICAL   PRIMARY CARE PROVIDER:  Lynne Leader, MD  CHIEF COMPLAINT:  Chest pain, dizziness, followup diabetes.  HISTORY OF PRESENT ILLNESS:  [name] is a 65 year old female who presented to the [facility] for workup stating that she had not been feeling well for the past few weeks.  She has had increased dizziness and vertigo, has fallen multiple times in the past few months and is concerned her diabetes and hypertension are not under control.  The patient feels like her vertigo has been worsening over the past 2 weeks as well as her balance.  She has fallen 3 times in the past month.  Has been feeling especially while in church.  She feels like she is spinning and not the room.  Of note, she has a history of chronic vertigo, was previously on meclizine and then switched to Valium, however, did not take his recently because it makes her sleepy.  Typically, she can get vertigo with any movement and she also noted when she looks down, she has more dizziness.  She denies any seizure activity, but does note she has been a  little more forgetful lately.  Of note, the patient has a meningioma which has not been followed for some time.  Her last MRI was approximately 2 years ago and she is followed at [facility].  Diabetes.  The patient also felt that her diabetes was out of control when asked about particular symptoms including chest pain.  She admitted to substernal chest pain on the left side, which she had occurrence last night.  Chest pain was sharp in nature, nonradiating, lasted approximately 30 minutes.  She fell diaphoretic.  Had a little  bit of nausea and some shortness of breath.  Pain subsided without the use of nitroglycerin.  The patient was resting when this occurred of note.  The patient had a repeat episode this morning of similar recurrence which lasted for 30 minutes.  At that time, she was getting up to fix breakfast for her husband.  She did not take nitroglycerin this morning as well.  Regarding her A1c, her last one was 8.9% in 2011.  REVIEW OF SYSTEMS:  Per above.  She also admits to lower extremity weakness.  Feels like her knees are going to buckle.  Denies any fever, vomiting.  Admits to a few loose stools.  No abdominal pain.  No dysuria.  Denies any injuries with falls stated above.  PHYSICAL EXAMINATION:  VITAL SIGNS:  Temperature 98.4, heart rate 61, blood pressure 170/88.  Weight is 146 pounds, respiratory rate 16. GENERAL:  No acute distress, alert and oriented. HEENT:  PERRL. EOMI.  No nystagmus.  Moist mucous membranes. NECK:  No JVD. CVS:  Regular rate and rhythm.  No murmur. RESPIRATORY:  CTAB. EXTREMITIES:  No edema.  NEURO:  Orientation intact.  Cranial nerves II through XII grossly intact.  Some past pointing with finger-to-nose.  Feels dizzy when looking downward on the gaze.  The patient has had ataxic gait with wide base.  She has positive Romberg.  Cannot complete neurological exam as the patient was symptomatic throughout.  IMAGING:  EKG obtained in office showed sinus rhythm with Q-waves in leads II, V1 through V4.  Orthostatics in clinic:  Lying blood pressure was 167/76, standing was 174/78.  ALLERGIES:  No known drug allergies.  MEDICATIONS: 1. Norvasc 10 mg daily. 2. Aspirin 81 mg daily. 3. Coreg 25 mg b.i.d. 4. Clarinex 5 mg daily. 5. Valium 5 mg one-half to one tablet three times a day p.r.n.     vertigo. 6. Glucotrol 10 mg b.i.d. 7. Lantus 10 units subcu daily. 8. Quinapril 20/12.5 mg 2 tablets by mouth daily. 9. sublingual p.r.n. chest pain. 10.Hydroxyzine 25 mg  q.6 h. p.r.n. itching.  PAST MEDICAL HISTORY: 1. Colon cancer, status post resection and chemotherapy in 2006. 2. Meningioma diagnosed in 2008. 3. Hypertension. 4. Diabetes. 5. Chronic vertigo. 6. Hypertriglyceridemia. 7. History of iron deficiency anemia. 8. Tobacco user. 9. History of depression. 10.Morton neuroma. 11.Nonobstructive coronary artery disease, cath in 2008. 12.GERD. 13.Allergic rhinitis. 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 15.Gait imbalance.  PAST SURGICAL HISTORY: 1. Hemicolectomy 2. TMJ surgery in 1990. 3. Bladder tack in 1992. 4. Hysterectomy in 1976. 5. Nissen fundoplication in Q000111Q.  FAMILY HISTORY:  Father with diabetes, is on dialysis and hypertension. No premature heart disease.  She had a son who committed suicide.  SOCIAL HISTORY:  She currently lives with her husband and active in the church.  She is on disability secondary to her previous cancer and meningioma.  No current tobacco, alcohol or illicit drug use.  ASSESSMENT/PLAN:  [name] is a 65 year old female with chest pain, dizziness/vertigo, and multiple falls. 1. Chest pain, concern for acute coronary syndrome.  The patient with     comorbidities and risk factors that are not well controlled, also     has history of nonobstructive coronary artery disease via cath in     2008.  Currently, chest pain-free.  We will admit to telemetry,     cycle enzymes, obtain a chest x-ray, TSH.  Of note, EKG concerning     for U waves and abnormal T-wave.  We will check lytes.  If the     patient has recurrent pain, we will consult Cardiology.  She is     diabetic and has an atypical presentation.  We will continue beta-     blocker and aspirin. 2. Dizziness.  The patient has chronic vertigo.  However, gait     imbalance has worsened.  Concerned this might be secondary to     growth in meningioma versus multifactorial from vertigo, diabetes,     and hypertension.  We will obtain MRI of brain, consult      Neurosurgery if needed.  We will avoid the Valium in this patient     as she has had multiple falls. 3. Diabetes mellitus, poorly controlled.  Check A1c.  Needs titration     of diabetic medicines, specifically insulin.  Unsure if she could     tolerate metformin. 4. Hypertension, elevated blood pressure, but not orthostatic in     clinic.  Hold ACE inhibitor secondary to possible intervention.     Continue other home meds.  Check potassium level. 5. Colon cancer.  Per report, surveillance has  been normal. 6. FEN/GI:  Diabetic diet.  Check lytes, saline lock, IV fluids. 7. Prophylaxis.  Heparin subcu 8 hours. 8. Disposition.  Pending workup, may need vestibular PT if workup is     normal or Neurology referral.  Her daughter's number is [phone]-     [phone].  Her name Ronelle Nigh.     Vic Blackbird, MD   ______________________________ Arty Baumgartner. Walker Kehr, M.D.    KD/MEDQ  D:  09/23/2010  T:  09/24/2010  Job:  [id number]  Electronically Signed by Vic Blackbird MD on 09/29/2010 10:07:03 PM Electronically Signed by Candelaria Celeste M.D. on 10/14/2010 09:17:23 PM

## 2010-10-18 ENCOUNTER — Ambulatory Visit: Payer: Medicare Other | Attending: Family Medicine | Admitting: Rehabilitative and Restorative Service Providers"

## 2010-10-18 DIAGNOSIS — R42 Dizziness and giddiness: Secondary | ICD-10-CM | POA: Insufficient documentation

## 2010-10-18 DIAGNOSIS — R269 Unspecified abnormalities of gait and mobility: Secondary | ICD-10-CM | POA: Insufficient documentation

## 2010-10-18 DIAGNOSIS — IMO0001 Reserved for inherently not codable concepts without codable children: Secondary | ICD-10-CM | POA: Insufficient documentation

## 2010-10-27 ENCOUNTER — Ambulatory Visit: Payer: Medicare Other | Admitting: Rehabilitative and Restorative Service Providers"

## 2010-10-29 ENCOUNTER — Ambulatory Visit: Payer: Medicare Other | Admitting: Rehabilitative and Restorative Service Providers"

## 2010-11-03 ENCOUNTER — Ambulatory Visit: Payer: Medicare Other | Admitting: Rehabilitative and Restorative Service Providers"

## 2010-11-05 ENCOUNTER — Ambulatory Visit: Payer: Medicare Other | Admitting: Rehabilitative and Restorative Service Providers"

## 2010-12-22 ENCOUNTER — Encounter: Payer: Self-pay | Admitting: Family Medicine

## 2010-12-22 ENCOUNTER — Ambulatory Visit (INDEPENDENT_AMBULATORY_CARE_PROVIDER_SITE_OTHER): Payer: Medicare Other | Admitting: Family Medicine

## 2010-12-22 VITALS — BP 155/93 | HR 68 | Temp 98.5°F | Ht 64.0 in | Wt 151.5 lb

## 2010-12-22 DIAGNOSIS — E1149 Type 2 diabetes mellitus with other diabetic neurological complication: Secondary | ICD-10-CM

## 2010-12-22 DIAGNOSIS — R42 Dizziness and giddiness: Secondary | ICD-10-CM

## 2010-12-22 DIAGNOSIS — I10 Essential (primary) hypertension: Secondary | ICD-10-CM

## 2010-12-22 DIAGNOSIS — Z23 Encounter for immunization: Secondary | ICD-10-CM

## 2010-12-22 MED ORDER — LISINOPRIL-HYDROCHLOROTHIAZIDE 10-12.5 MG PO TABS: 1.0000 | ORAL_TABLET | Freq: Every day | ORAL | Status: DC

## 2010-12-22 NOTE — Progress Notes (Signed)
Ms. Beyda presents to clinic today to followup her issues.  Vertigo: She attended vestibular rehabilitation which ended on 27 July.  This helped a lot and very much minimized her symptoms. She occasionally will take a Valium if she is having a very bad day, but otherwise is doing very well.  Hypertension: Currently not taking any of her blood pressure medicine. She had presyncopal symptoms while previously taking medications, and stopped all her medicines on her own. She denies any chest pain palpitations or syncopal symptoms.  Diabetes: Currently taking glipizide and Lantus. Her meter broke in July and she ordered a new one. The meter should be here next. She denies any symptomatic hypoglycemic episode.  PMH reviewed.  ROS as above otherwise neg Medications reviewed.  Exam:  BP 155/93  Pulse 68  Temp(Src) 98.5 F (36.9 C) (Oral)  Ht 5\' 4"  (1.626 m)  Wt 151 lb 8 oz (68.72 kg)  BMI 26.00 kg/m2 Gen: Well NAD HEENT: EOMI,  MMM Lungs: CTABL Nl WOB Heart: RRR no MRG Abd: NABS, NT, ND Exts: Non edematous BL  LE, warm and well perfused. Feet have 4 x 4 sensitivity to monofilament bilaterally. No ulcers noted.

## 2010-12-22 NOTE — Assessment & Plan Note (Signed)
Currently not controlled. Patient was symptomatic with her prior blood pressure medications and stopped taking them on her own. Plan to restart lisinopril/hydrochlorothiazide. We will follow her blood pressure in one month and add Norvasc if needed.

## 2010-12-22 NOTE — Assessment & Plan Note (Signed)
Much improved with rehabilitation. We will follow her

## 2010-12-22 NOTE — Patient Instructions (Addendum)
Thanks for seeing me today. I wrote you a new prescription of lisinopril/hydrochlorothiazide for blood pressure. Take that medicine and let's see what your blood pressure is in one month. I would like you to stop the glipizide when you get your blood glucose meter. Check your glucose every morning. If your blood sugar is more than 200 for 3 days in a row in the morning, increased you Lantus by 3 units. Write your blood sugars down and how much Lantus you are taking and we will go over this at your next visit in one month. Make an appointment to see me in one month.

## 2010-12-22 NOTE — Assessment & Plan Note (Signed)
Her A1c is currently out of range. I'm not sure where her blood sugars are usually Plan: Get meter and start checking every morning. We'll also stop glipizide and titrate Lantus up. We will followup in one month with review of blood glucose log. We may need basal bolus therapy for good control

## 2011-01-07 LAB — DIFFERENTIAL
Basophils Absolute: 0
Basophils Relative: 1
Eosinophils Absolute: 0.2
Eosinophils Relative: 2
Lymphocytes Relative: 39
Lymphs Abs: 3.4
Monocytes Absolute: 0.6
Monocytes Relative: 7
Neutro Abs: 4.5
Neutrophils Relative %: 52

## 2011-01-07 LAB — URINALYSIS, ROUTINE W REFLEX MICROSCOPIC
Bilirubin Urine: NEGATIVE
Glucose, UA: NEGATIVE
Hgb urine dipstick: NEGATIVE
Ketones, ur: NEGATIVE
Nitrite: NEGATIVE
Protein, ur: NEGATIVE
Specific Gravity, Urine: 1.016
Urobilinogen, UA: 0.2
pH: 5

## 2011-01-07 LAB — CBC
HCT: 39.2
Hemoglobin: 12.9
MCHC: 32.8
MCV: 88.5
Platelets: 242
RBC: 4.43
RDW: 13.4
WBC: 8.7

## 2011-01-07 LAB — POCT I-STAT, CHEM 8
BUN: 15
Calcium, Ion: 1.09 — ABNORMAL LOW
Chloride: 107
Creatinine, Ser: 0.9
Glucose, Bld: 91
HCT: 40
Hemoglobin: 13.6
Potassium: 3.8
Sodium: 140
TCO2: 27

## 2011-01-07 LAB — POCT CARDIAC MARKERS
CKMB, poc: 1.4
Myoglobin, poc: 58.4
Troponin i, poc: <0.05

## 2011-01-14 LAB — BASIC METABOLIC PANEL
BUN: 12
CO2: 27
Calcium: 9
Chloride: 107
Creatinine, Ser: 0.67
GFR calc Af Amer: >60
GFR calc non Af Amer: >60
Glucose, Bld: 158 — ABNORMAL HIGH
Potassium: 3.6
Sodium: 141

## 2011-01-14 LAB — I-STAT 8, (EC8 V) (CONVERTED LAB)
Acid-base deficit: 1
BUN: 8
Bicarbonate: 24.9 — ABNORMAL HIGH
Chloride: 108
Glucose, Bld: 203 — ABNORMAL HIGH
HCT: 38
Hemoglobin: 12.9
Operator id: 198171
Potassium: 3.7
Sodium: 139
TCO2: 26
pCO2, Ven: 46.9
pH, Ven: 7.333 — ABNORMAL HIGH

## 2011-01-14 LAB — CBC
HCT: 31.7 — ABNORMAL LOW
HCT: 35.3 — ABNORMAL LOW
Hemoglobin: 10.9 — ABNORMAL LOW
Hemoglobin: 11.7 — ABNORMAL LOW
MCHC: 33.2
MCHC: 34.3
MCV: 86.4
MCV: 86.7
Platelets: 231
Platelets: 241
RBC: 3.67 — ABNORMAL LOW
RBC: 4.07
RDW: 13.4
RDW: 13.5
WBC: 7.5
WBC: 7.9

## 2011-01-14 LAB — CARDIAC PANEL(CRET KIN+CKTOT+MB+TROPI)
CK, MB: 1
CK, MB: 1.4
CK, MB: 1.8
Relative Index: 1.7
Relative Index: INVALID
Relative Index: INVALID
Total CK: 105
Total CK: 68
Total CK: 74
Troponin I: 0.01
Troponin I: 0.02
Troponin I: 0.03

## 2011-01-14 LAB — LIPID PANEL
Cholesterol: 140
HDL: 25 — ABNORMAL LOW
LDL Cholesterol: UNDETERMINED
Total CHOL/HDL Ratio: 5.6
Triglycerides: 689 — ABNORMAL HIGH
VLDL: UNDETERMINED

## 2011-01-14 LAB — TSH: TSH: 0.745

## 2011-01-14 LAB — HEMOGLOBIN A1C
Hgb A1c MFr Bld: 9.2 — ABNORMAL HIGH
Mean Plasma Glucose: 250

## 2011-01-14 LAB — HEPARIN LEVEL (UNFRACTIONATED): Heparin Unfractionated: <0.1 — ABNORMAL LOW

## 2011-01-14 LAB — POCT CARDIAC MARKERS
CKMB, poc: 1
Myoglobin, poc: 61.5
Operator id: 198171
Troponin i, poc: <0.05

## 2011-01-14 LAB — D-DIMER, QUANTITATIVE: D-Dimer, Quant: <0.22

## 2011-01-14 LAB — PROTIME-INR
INR: 1
Prothrombin Time: 13.3

## 2011-01-14 LAB — POCT I-STAT CREATININE
Creatinine, Ser: 0.7
Operator id: 198171

## 2011-01-20 LAB — CBC
HCT: 34.5 — ABNORMAL LOW
HCT: 34.5 — ABNORMAL LOW
HCT: 38.4
Hemoglobin: 11.7 — ABNORMAL LOW
Hemoglobin: 11.7 — ABNORMAL LOW
Hemoglobin: 12.9
MCHC: 33.6
MCHC: 33.8
MCHC: 34
MCV: 85.4
MCV: 85.5
MCV: 86
Platelets: 229
Platelets: 240
Platelets: 266
RBC: 4.04
RBC: 4.04
RBC: 4.47
RDW: 12.5
RDW: 12.7
RDW: 12.9
WBC: 7.1
WBC: 7.4
WBC: 8

## 2011-01-20 LAB — COMPREHENSIVE METABOLIC PANEL
ALT: 17
AST: 22
Albumin: 3 — ABNORMAL LOW
Alkaline Phosphatase: 68
BUN: 7
CO2: 27
Calcium: 8.6
Chloride: 103
Creatinine, Ser: 0.66
GFR calc Af Amer: >60
GFR calc non Af Amer: >60
Glucose, Bld: 222 — ABNORMAL HIGH
Potassium: 3.4 — ABNORMAL LOW
Sodium: 140
Total Bilirubin: 1.1
Total Protein: 6.1

## 2011-01-20 LAB — BASIC METABOLIC PANEL
BUN: 6
BUN: 9
CO2: 25
CO2: 26
Calcium: 8.8
Calcium: 9
Chloride: 103
Chloride: 105
Creatinine, Ser: 0.6
Creatinine, Ser: 0.62
GFR calc Af Amer: >60
GFR calc Af Amer: >60
GFR calc non Af Amer: >60
GFR calc non Af Amer: >60
Glucose, Bld: 174 — ABNORMAL HIGH
Glucose, Bld: 202 — ABNORMAL HIGH
Potassium: 3.5
Potassium: 4.2
Sodium: 138
Sodium: 139

## 2011-01-20 LAB — I-STAT 8, (EC8 V) (CONVERTED LAB)
Acid-Base Excess: 1
BUN: 9
Bicarbonate: 25.7 — ABNORMAL HIGH
Chloride: 106
Glucose, Bld: 208 — ABNORMAL HIGH
HCT: 41
Hemoglobin: 13.9
Operator id: 272551
Potassium: 3.8
Sodium: 139
TCO2: 27
pCO2, Ven: 39.7 — ABNORMAL LOW
pH, Ven: 7.42 — ABNORMAL HIGH

## 2011-01-20 LAB — DIFFERENTIAL
Basophils Absolute: 0
Basophils Relative: 0
Eosinophils Absolute: 0.1
Eosinophils Relative: 1
Lymphocytes Relative: 37
Lymphs Abs: 3
Monocytes Absolute: 0.5
Monocytes Relative: 7
Neutro Abs: 4.3
Neutrophils Relative %: 54

## 2011-01-20 LAB — POCT I-STAT CREATININE
Creatinine, Ser: 0.6
Operator id: 272551

## 2011-01-21 ENCOUNTER — Ambulatory Visit: Payer: Medicare Other | Admitting: Family Medicine

## 2011-01-21 LAB — CLOTEST (H. PYLORI), BIOPSY: Helicobacter screen: NEGATIVE

## 2011-03-02 ENCOUNTER — Other Ambulatory Visit: Payer: Self-pay | Admitting: Family Medicine

## 2011-03-02 MED ORDER — AMLODIPINE BESYLATE 10 MG PO TABS: 10.0000 mg | ORAL_TABLET | Freq: Every day | ORAL | Status: DC

## 2011-03-28 ENCOUNTER — Other Ambulatory Visit: Payer: Self-pay | Admitting: Family Medicine

## 2011-03-28 DIAGNOSIS — Z1231 Encounter for screening mammogram for malignant neoplasm of breast: Secondary | ICD-10-CM

## 2011-04-02 ENCOUNTER — Telehealth: Payer: Self-pay | Admitting: Hematology and Oncology

## 2011-04-02 NOTE — Telephone Encounter (Signed)
Talked to pt, gave her appt for 05/04/11

## 2011-04-18 ENCOUNTER — Other Ambulatory Visit: Payer: Medicare Other | Admitting: Lab

## 2011-05-03 ENCOUNTER — Encounter: Payer: Self-pay | Admitting: *Deleted

## 2011-05-04 ENCOUNTER — Ambulatory Visit (HOSPITAL_BASED_OUTPATIENT_CLINIC_OR_DEPARTMENT_OTHER): Payer: Medicare Other | Admitting: Hematology and Oncology

## 2011-05-04 ENCOUNTER — Other Ambulatory Visit (HOSPITAL_BASED_OUTPATIENT_CLINIC_OR_DEPARTMENT_OTHER): Payer: Medicare Other | Admitting: Lab

## 2011-05-04 ENCOUNTER — Telehealth: Payer: Self-pay | Admitting: Hematology and Oncology

## 2011-05-04 VITALS — BP 150/92 | HR 80 | Temp 98.1°F | Ht 64.0 in | Wt 150.4 lb

## 2011-05-04 DIAGNOSIS — Z9221 Personal history of antineoplastic chemotherapy: Secondary | ICD-10-CM

## 2011-05-04 DIAGNOSIS — Z85038 Personal history of other malignant neoplasm of large intestine: Secondary | ICD-10-CM

## 2011-05-04 DIAGNOSIS — C189 Malignant neoplasm of colon, unspecified: Secondary | ICD-10-CM

## 2011-05-04 DIAGNOSIS — C182 Malignant neoplasm of ascending colon: Secondary | ICD-10-CM

## 2011-05-04 LAB — CBC WITH DIFFERENTIAL/PLATELET
BASO%: 0.2 % (ref 0.0–2.0)
Basophils Absolute: 0 10*3/uL (ref 0.0–0.1)
EOS%: 0.7 % (ref 0.0–7.0)
Eosinophils Absolute: 0.1 10*3/uL (ref 0.0–0.5)
HCT: 38.1 % (ref 34.8–46.6)
HGB: 12.9 g/dL (ref 11.6–15.9)
LYMPH%: 34.7 % (ref 14.0–49.7)
MCH: 28.4 pg (ref 25.1–34.0)
MCHC: 33.9 g/dL (ref 31.5–36.0)
MCV: 83.9 fL (ref 79.5–101.0)
MONO#: 0.6 10*3/uL (ref 0.1–0.9)
MONO%: 6.4 % (ref 0.0–14.0)
NEUT#: 5.2 10*3/uL (ref 1.5–6.5)
NEUT%: 58 % (ref 38.4–76.8)
Platelets: 246 10*3/uL (ref 145–400)
RBC: 4.54 10*6/uL (ref 3.70–5.45)
RDW: 12.8 % (ref 11.2–14.5)
WBC: 9 10*3/uL (ref 3.9–10.3)
lymph#: 3.1 10*3/uL (ref 0.9–3.3)

## 2011-05-04 NOTE — Progress Notes (Signed)
This office note has been dictated.

## 2011-05-04 NOTE — Telephone Encounter (Signed)
Gv pt appt for jan-feb2014.  called Dr. Carlean Purl to scheduled appt and was told that pt was dismissed from the practice .  Called Dr. Juanita Craver office s/w [facility] and she asked that we fax over all pts medical information for review and they will call pt

## 2011-05-05 LAB — COMPREHENSIVE METABOLIC PANEL
ALT: 10 U/L (ref 0–35)
AST: 15 U/L (ref 0–37)
Albumin: 4.3 g/dL (ref 3.5–5.2)
Alkaline Phosphatase: 79 U/L (ref 39–117)
BUN: 19 mg/dL (ref 6–23)
CO2: 22 mEq/L (ref 19–32)
Calcium: 9.9 mg/dL (ref 8.4–10.5)
Chloride: 99 mEq/L (ref 96–112)
Creatinine, Ser: 0.92 mg/dL (ref 0.50–1.10)
Glucose, Bld: 272 mg/dL — ABNORMAL HIGH (ref 70–99)
Potassium: 3.5 mEq/L (ref 3.5–5.3)
Sodium: 135 mEq/L (ref 135–145)
Total Bilirubin: 0.5 mg/dL (ref 0.3–1.2)
Total Protein: 7.1 g/dL (ref 6.0–8.3)

## 2011-05-05 LAB — CEA: CEA: 1.5 ng/mL (ref 0.0–5.0)

## 2011-05-05 NOTE — Progress Notes (Signed)
CC:   [name], M.D. Gatha Mayer, MD,FACG Lynne Leader, MD  IDENTIFYING STATEMENT:  The patient is a 66 year old woman presents for followup.  PROBLEM LIST: 1. 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 2. Status post 6 months of adjuvant chemotherapy with 4 cycles of     FOLFOX6 initiated on 04/17/2005.  This was followed by 8 cycles of     [place]gram[place] regimen, completing therapy on 10/13/2005. 3. Hypertension. 4. Type 2 diabetes. 5. Hypercholesterolemia. 6. Status post Nissen fundoplication for GERD in 1996. 7. History of prolapsed bladder.  INTERVAL HISTORY:  [name] has no current issues or concerns since her last followup visit in mid last year.  She has no weight loss, nausea vomiting, diarrhea, melena, or hematochezia.  Her last colonoscopy was in 2007.  She has not been able to receive one as she lacks the funds, but she now does have insurance.  MEDICATIONS:  Reviewed and updated.  PHYSICAL EXAMINATION:  General Appearance:  Alert and oriented x3. Vital Signs:  Pulse 80, blood pressure 150/98, temp 98.1, respirations 18, weight 160 pounds.  HEENT:  Head is atraumatic, normocephalic. Sclerae are anicteric.  Mouth is moist.  Chest:  Clear.  Abdomen:  Soft, nontender.  Bowel sounds present.  Extremities:  No edema.  LABORATORY DATA:  On 05/04/2011, white cell count 9, hemoglobin 12.9, hematocrit 38.1, platelets 246.  CEA and CMET are pending.  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  Completed 6 months of adjuvant therapy, last receiving treatment on 10/14/2005.  She currently has no evidence of disease.  However, she does require colonoscopy.  She has agreed to move ahead.  Thus, we will have  one scheduled through Dr. Celesta Aver office.  If all is well, the patient follows up in a year's time.    ______________________________ Nira Retort, M.D. LIO/MEDQ  D:  05/04/2011  T:  05/04/2011  Job:  [id number]

## 2011-05-09 ENCOUNTER — Ambulatory Visit
Admission: RE | Admit: 2011-05-09 | Discharge: 2011-05-09 | Disposition: A | Discharge status: Home / Self Care | Payer: Medicare Other | Source: Ambulatory Visit | Attending: Family Medicine | Admitting: Family Medicine

## 2011-05-09 DIAGNOSIS — Z1231 Encounter for screening mammogram for malignant neoplasm of breast: Secondary | ICD-10-CM

## 2011-05-19 ENCOUNTER — Encounter: Payer: Self-pay | Admitting: Internal Medicine

## 2011-05-24 ENCOUNTER — Encounter: Payer: Self-pay | Admitting: Family Medicine

## 2011-05-24 ENCOUNTER — Ambulatory Visit (INDEPENDENT_AMBULATORY_CARE_PROVIDER_SITE_OTHER): Payer: Medicare Other | Admitting: Family Medicine

## 2011-05-24 VITALS — BP 170/87 | HR 69 | Ht 64.0 in | Wt 148.5 lb

## 2011-05-24 DIAGNOSIS — M7711 Lateral epicondylitis, right elbow: Secondary | ICD-10-CM

## 2011-05-24 DIAGNOSIS — I10 Essential (primary) hypertension: Secondary | ICD-10-CM

## 2011-05-24 DIAGNOSIS — M7710 Lateral epicondylitis, unspecified elbow: Secondary | ICD-10-CM

## 2011-05-24 DIAGNOSIS — E1149 Type 2 diabetes mellitus with other diabetic neurological complication: Secondary | ICD-10-CM

## 2011-05-24 DIAGNOSIS — E1142 Type 2 diabetes mellitus with diabetic polyneuropathy: Secondary | ICD-10-CM

## 2011-05-24 DIAGNOSIS — C189 Malignant neoplasm of colon, unspecified: Secondary | ICD-10-CM

## 2011-05-24 LAB — POCT GLYCOSYLATED HEMOGLOBIN (HGB A1C): Hemoglobin A1C: 9.5

## 2011-05-24 MED ORDER — INSULIN GLARGINE 100 UNIT/ML ~~LOC~~ SOLN: 30.0000 [IU] | Freq: Every day | SUBCUTANEOUS | Status: DC

## 2011-05-24 MED ORDER — LISINOPRIL-HYDROCHLOROTHIAZIDE 10-12.5 MG PO TABS: 1.0000 | ORAL_TABLET | Freq: Every day | ORAL | Status: DC

## 2011-05-24 NOTE — Patient Instructions (Signed)
Thank you for coming in today. Please re-start your lisinopril/HCTZ blood pressure pill.  Stop glipizide.  Increase your lantus to 30 units daily . Come back in 1 months.  Go to [employer] and buy a Tennis Elbow Strap. Apply it to your forearm.   Lateral Epicondylitis (Tennis Elbow) with Rehab Lateral epicondylitis involves inflammation and pain around the outer portion of the elbow. The pain is caused by inflammation of the tendons in the forearm that bring back (extend) the wrist. Lateral epicondylittis is also called tennis elbow, because it is very common in tennis players. However, it may occur in any individual who extends the wrist repetitively. If lateral epicondylitis is left untreated, it may become a chronic problem. SYMPTOMS    Pain, tenderness, and inflammation on the outer (lateral) side of the elbow.     Pain or weakness with gripping activities.     Pain that increases with wrist twisting motions (playing tennis, using a screwdriver, opening a door or a jar).     Pain with lifting objects, including a coffee cup.  CAUSES   Lateral epicondylitis is caused by inflammation of the tendons that extend the wrist. Causes of injury may include:  Repetitive stress and strain on the muscles and tendons that extend the wrist.     Sudden change in activity level or intensity.     Incorrect grip in racquet sports.     Incorrect grip size of racquet (often too large).     Incorrect hitting position or technique (usually backhand, leading with the elbow).     Using a racket that is too heavy.  RISK INCREASES WITH:  Sports or occupations that require repetitive and/or strenuous forearm and wrist movements (tennis, squash, racquetball, carpentry).     Poor wrist and forearm strength and flexibility.     Failure to warm up properly before activity.     Resuming activity before healing, rehabilitation, and conditioning are complete.  PREVENTION    Warm up and stretch properly  before activity.     Maintain physical fitness:     Strength, flexibility, and endurance.     Cardiovascular fitness.     Wear and use properly fitted equipment.     Learn and use proper technique and have a coach correct improper technique.     Wear a tennis elbow (counterforce) brace.  PROGNOSIS   The course of this condition depends on the degree of the injury. If treated properly, acute cases (symptoms lasting less than 4 weeks) are often resolved in 2 to 6 weeks. Chronic (longer lasting cases) often resolve in 3 to 6 months, but may require physical therapy. RELATED COMPLICATIONS    Frequently recurring symptoms, resulting in a chronic problem. Properly treating the problem the first time decreases frequency of recurrence.     Chronic inflammation, scarring tendon degeneration, and partial tendon tear, requiring surgery.     Delayed healing or resolution of symptoms.  TREATMENT   Treatment first involves the use of ice and medicine, to reduce pain and inflammation. Strengthening and stretching exercises may help reduce discomfort, if performed regularly. These exercises may be performed at home, if the condition is an acute injury. Chronic cases may require a referral to a physical therapist for evaluation and treatment. Your caregiver may advise a corticosteroid injection, to help reduce inflammation. Rarely, surgery is needed. MEDICATION  If pain medicine is needed, nonsteroidal anti-inflammatory medicines (aspirin and ibuprofen), or other minor pain relievers (acetaminophen), are often advised.  Do not take pain medicine for 7 days before surgery.     Prescription pain relievers may be given, if your caregiver thinks they are needed. Use only as directed and only as much as you need.     Corticosteroid injections may be recommended. These injections should be reserved only for the most severe cases, because they can only be given a certain number of times.  HEAT AND  COLD  Cold treatment (icing) should be applied for 10 to 15 minutes every 2 to 3 hours for inflammation and pain, and immediately after activity that aggravates your symptoms. Use ice packs or an ice massage.     Heat treatment may be used before performing stretching and strengthening activities prescribed by your caregiver, physical therapist, or athletic trainer. Use a heat pack or a warm water soak.  SEEK MEDICAL CARE IF: Symptoms get worse or do not improve in 2 weeks, despite treatment. EXERCISES   RANGE OF MOTION (ROM) AND STRETCHING EXERCISES - Epicondylitis, Lateral (Tennis Elbow) These exercises may help you when beginning to rehabilitate your injury. Your symptoms may go away with or without further involvement from your physician, physical therapist or athletic trainer. While completing these exercises, remember:    Restoring tissue flexibility helps normal motion to return to the joints. This allows healthier, less painful movement and activity.     An effective stretch should be held for at least 30 seconds.     A stretch should never be painful. You should only feel a gentle lengthening or release in the stretched tissue.  RANGE OF MOTION - Wrist Flexion, Active-Assisted  Extend your right / left elbow with your fingers pointing down.*     Gently pull the back of your hand towards you, until you feel a gentle stretch on the top of your forearm.     Hold this position for __________ seconds.  Repeat __________ times. Complete this exercise __________ times per day.   *If directed by your physician, physical therapist or athletic trainer, complete this stretch with your elbow bent, rather than extended. RANGE OF MOTION - Wrist Extension, Active-Assisted  Extend your right / left elbow and turn your palm upwards.*     Gently pull your palm and fingertips back, so your wrist extends and your fingers point more toward the ground.     You should feel a gentle stretch on the  inside of your forearm.     Hold this position for __________ seconds.  Repeat __________ times. Complete this exercise __________ times per day. *If directed by your physician, physical therapist or athletic trainer, complete this stretch with your elbow bent, rather than extended. STRETCH - Wrist Flexion  Place the back of your right / left hand on a tabletop, leaving your elbow slightly bent. Your fingers should point away from your body.     Gently press the back of your hand down onto the table by straightening your elbow. You should feel a stretch on the top of your forearm.     Hold this position for __________ seconds.  Repeat __________ times. Complete this stretch __________ times per day.   STRETCH - Wrist Extension   Place your right / left fingertips on a tabletop, leaving your elbow slightly bent. Your fingers should point backwards.     Gently press your fingers and palm down onto the table by straightening your elbow. You should feel a stretch on the inside of your forearm.     Hold this  position for __________ seconds.  Repeat __________ times. Complete this stretch __________ times per day.   STRENGTHENING EXERCISES - Epicondylitis, Lateral (Tennis Elbow) These exercises may help you when beginning to rehabilitate your injury. They may resolve your symptoms with or without further involvement from your physician, physical therapist or athletic trainer. While completing these exercises, remember:    Muscles can gain both the endurance and the strength needed for everyday activities through controlled exercises.     Complete these exercises as instructed by your physician, physical therapist or athletic trainer. Increase the resistance and repetitions only as guided.     You may experience muscle soreness or fatigue, but the pain or discomfort you are trying to eliminate should never worsen during these exercises. If this pain does get worse, stop and make sure you are  following the directions exactly. If the pain is still present after adjustments, discontinue the exercise until you can discuss the trouble with your caregiver.  STRENGTH - Wrist Flexors  Sit with your right / left forearm palm-up and fully supported on a table or countertop. Your elbow should be resting below the height of your shoulder. Allow your wrist to extend over the edge of the surface.     Loosely holding a __________ weight, or a piece of rubber exercise band or tubing, slowly curl your hand up toward your forearm.     Hold this position for __________ seconds. Slowly lower the wrist back to the starting position in a controlled manner.  Repeat __________ times. Complete this exercise __________ times per day.   STRENGTH - Wrist Extensors  Sit with your right / left forearm palm-down and fully supported on a table or countertop. Your elbow should be resting below the height of your shoulder. Allow your wrist to extend over the edge of the surface.     Loosely holding a __________ weight, or a piece of rubber exercise band or tubing, slowly curl your hand up toward your forearm.     Hold this position for __________ seconds. Slowly lower the wrist back to the starting position in a controlled manner.  Repeat __________ times. Complete this exercise __________ times per day.   STRENGTH - Ulnar Deviators  Stand with a ____________________ weight in your right / left hand, or sit while holding a rubber exercise band or tubing, with your healthy arm supported on a table or countertop.     Move your wrist, so that your pinkie travels toward your forearm and your thumb moves away from your forearm.     Hold this position for __________ seconds and then slowly lower the wrist back to the starting position.  Repeat __________ times. Complete this exercise __________ times per day STRENGTH - Radial Deviators  Stand with a ____________________ weight in your right / left hand, or sit while  holding a rubber exercise band or tubing, with your injured arm supported on a table or countertop.     Raise your hand upward in front of you or pull up on the rubber tubing.     Hold this position for __________ seconds and then slowly lower the wrist back to the starting position.  Repeat __________ times. Complete this exercise __________ times per day. STRENGTH - Forearm Supinators   Sit with your right / left forearm supported on a table, keeping your elbow below shoulder height. Rest your hand over the edge, palm down.     Gently grip a hammer or a soup ladle.  Without moving your elbow, slowly turn your palm and hand upward to a "thumbs-up" position.     Hold this position for __________ seconds. Slowly return to the starting position.  Repeat __________ times. Complete this exercise __________ times per day.   STRENGTH - Forearm Pronators   Sit with your right / left forearm supported on a table, keeping your elbow below shoulder height. Rest your hand over the edge, palm up.     Gently grip a hammer or a soup ladle.     Without moving your elbow, slowly turn your palm and hand upward to a "thumbs-up" position.     Hold this position for __________ seconds. Slowly return to the starting position.  Repeat __________ times. Complete this exercise __________ times per day.   STRENGTH - Grip  Grasp a tennis ball, a dense sponge, or a large, rolled sock in your hand.     Squeeze as hard as you can, without increasing any pain.     Hold this position for __________ seconds. Release your grip slowly.  Repeat __________ times. Complete this exercise __________ times per day.   STRENGTH - Elbow Extensors, Isometric  Stand or sit upright, on a firm surface. Place your right / left arm so that your palm faces your stomach, and it is at the height of your waist.     Place your opposite hand on the underside of your forearm. Gently push up as your right / left arm resists. Push  as hard as you can with both arms, without causing any pain or movement at your right / left elbow. Hold this stationary position for __________ seconds.  Gradually release the tension in both arms. Allow your muscles to relax completely before repeating. Document Released: 03/28/2005 Document Revised: 12/08/2010 Document Reviewed: 07/10/2008 [facility] Patient Information 2012 [place].

## 2011-05-25 NOTE — Assessment & Plan Note (Signed)
Diabetes not adequately controlled with an A1c of 9.5.  Currently on glipizide twice a day and Lantus twice a day. Plan to simplify an increased regimen by stopping glipizide and increase her Lantus to 30 units once a day. We'll followup in one month with glucose logs. Warned patient about hypoglycemia. She expresses understanding.

## 2011-05-25 NOTE — Assessment & Plan Note (Signed)
Pain is classic for lateral epicondylitis. Plan to treat with strap and rehabilitation exercises. If no improvement at the next visit will refer to either sports medicine or directly to physical therapy.  Handout provided reviewed with patient: Exercises

## 2011-05-25 NOTE — Progress Notes (Signed)
[name] is a 66 y.o. female who presents to [facility] today for   1) hypertension: Is taking amlodipine but has run out of lisinopril/hydrochlorothiazide. Feels well without any chest pain palpitations dyspnea or syncope.   2) diabetes: A1c currently 9.5 taking glipizide and Lantus.  Takes Lantus 10 units twice a day. Denies any hypoglycemic episodes or significant hyperglycemic episodes with polyuria or polydipsia. Denies any foot ulcers or decreased feeling in her feet. Feels well otherwise.Marland Kitchen   3) right elbow pain: Noted right elbow pain especially when picking up objects worse over the past few weeks.  Pain radiates approximately 10 cm proximally and 10 cm distally. No hand weakness or numbness.    PMH reviewed. Significant for hypertension diabetes and colon cancer status post resection approximately 6 years ago ROS as above otherwise neg Medications reviewed. Current Outpatient Prescriptions  Medication Sig Dispense Refill  . amLODipine (NORVASC) 10 MG tablet Take 1 tablet (10 mg total) by mouth daily.  30 tablet  5  . aspirin 81 MG chewable tablet Chew 81 mg by mouth daily.        Marland Kitchen glucose blood (TRUETRACK TEST) test strip Use as instructed. Dispense 50 strips       . insulin glargine (LANTUS) 100 UNIT/ML injection Inject 30 Units into the skin daily. Dispense 1 month supply  10 mL  6  . lisinopril-hydrochlorothiazide (PRINZIDE,ZESTORETIC) 10-12.5 MG per tablet Take 1 tablet by mouth daily.  30 tablet  2  . nitroGLYCERIN (NITROSTAT) 0.4 MG SL tablet Place 0.4 mg under the tongue every 5 (five) minutes as needed. If chest pain not relieved by third tab call 911.        Exam:  BP 170/87  Pulse 69  Ht 5\' 4"  (1.626 m)  Wt 148 lb 8 oz (67.359 kg)  BMI 25.49 kg/m2 Gen: Well NAD HEENT: EOMI,  MMM  Lungs: CTABL Nl WOB Heart: RRR no MRG Abd: NABS, NT, ND Exts: Non edematous BL  LE, warm and well perfused. No ulcers or nail overgrowth noted on feet. Right is 4/4 to monofilament left  is 3/4 to monofilament. MSK: Pain over the lateral epicondyles when picking a heavy book up with hand pronated.  No pain with full range of motion relation or supination. No shoulder pain. Normal strength in hand normal sensation and normal capillary refill bilaterally.

## 2011-05-25 NOTE — Assessment & Plan Note (Signed)
Blood pressure not controlled currently however not on hydrochlorothiazide/lisinopril. Plan to restart that medication and followup in one month with blood pressure recheck. Discussed warning signs such as chest pains or palpitations with patient who expresses understanding

## 2011-05-31 ENCOUNTER — Ambulatory Visit (AMBULATORY_SURGERY_CENTER): Payer: Medicare Other | Admitting: *Deleted

## 2011-05-31 VITALS — Ht 64.0 in | Wt 151.4 lb

## 2011-05-31 DIAGNOSIS — Z1211 Encounter for screening for malignant neoplasm of colon: Secondary | ICD-10-CM

## 2011-05-31 DIAGNOSIS — Z85038 Personal history of other malignant neoplasm of large intestine: Secondary | ICD-10-CM

## 2011-05-31 MED ORDER — PEG-KCL-NACL-NASULF-NA ASC-C 100 G PO SOLR: ORAL | Status: DC

## 2011-06-08 ENCOUNTER — Telehealth: Payer: Self-pay | Admitting: Family Medicine

## 2011-06-08 NOTE — Telephone Encounter (Signed)
Forward to PCP for Rx, I will fax to [facility] once I have the scripts.[name], Kevin Fenton

## 2011-06-08 NOTE — Telephone Encounter (Signed)
MAP program needs Dr. Georgina Snell to place an order for all of her medications.  It can be faxed to [facility], phone # [phone].

## 2011-06-09 ENCOUNTER — Other Ambulatory Visit: Payer: Self-pay | Admitting: Family Medicine

## 2011-06-09 MED ORDER — QUINAPRIL-HYDROCHLOROTHIAZIDE 20-12.5 MG PO TABS: 2.0000 | ORAL_TABLET | Freq: Every day | ORAL | Status: DC

## 2011-06-09 MED ORDER — CARVEDILOL 25 MG PO TABS: 25.0000 mg | ORAL_TABLET | Freq: Two times a day (BID) | ORAL | Status: DC

## 2011-06-09 MED ORDER — DESLORATADINE 5 MG PO TABS: 5.0000 mg | ORAL_TABLET | Freq: Every day | ORAL | Status: DC

## 2011-06-09 NOTE — Telephone Encounter (Signed)
Called pt. Informed. .[name]  

## 2011-06-09 NOTE — Telephone Encounter (Signed)
Called pharmacy and sent in new rx.  Please call pt and let her know they are ready.

## 2011-06-14 ENCOUNTER — Ambulatory Visit (AMBULATORY_SURGERY_CENTER): Payer: Medicare Other | Admitting: Internal Medicine

## 2011-06-14 ENCOUNTER — Other Ambulatory Visit: Payer: Medicare Other | Admitting: Internal Medicine

## 2011-06-14 ENCOUNTER — Encounter: Payer: Self-pay | Admitting: Internal Medicine

## 2011-06-14 DIAGNOSIS — Z1211 Encounter for screening for malignant neoplasm of colon: Secondary | ICD-10-CM

## 2011-06-14 DIAGNOSIS — D129 Benign neoplasm of anus and anal canal: Secondary | ICD-10-CM

## 2011-06-14 DIAGNOSIS — Z860100 Personal history of colon polyps, unspecified: Secondary | ICD-10-CM

## 2011-06-14 DIAGNOSIS — Z85038 Personal history of other malignant neoplasm of large intestine: Secondary | ICD-10-CM

## 2011-06-14 DIAGNOSIS — D126 Benign neoplasm of colon, unspecified: Secondary | ICD-10-CM

## 2011-06-14 DIAGNOSIS — D128 Benign neoplasm of rectum: Secondary | ICD-10-CM

## 2011-06-14 LAB — GLUCOSE, CAPILLARY
Glucose-Capillary: 234 mg/dL — ABNORMAL HIGH (ref 70–99)
Glucose-Capillary: 170 mg/dL — ABNORMAL HIGH (ref 70–99)

## 2011-06-14 MED ORDER — SODIUM CHLORIDE 0.9 % IV SOLN: 500.0000 mL | INTRAVENOUS | Status: DC

## 2011-06-14 NOTE — Progress Notes (Signed)
Patient did not experience any of the following events: a burn prior to discharge; a fall within the facility; wrong site/side/patient/procedure/implant event; or a hospital transfer or hospital admission upon discharge from the facility. (G8907) Patient did not have preoperative order for IV antibiotic SSI prophylaxis. ([id number])  

## 2011-06-14 NOTE — Patient Instructions (Addendum)
YOU HAD AN ENDOSCOPIC PROCEDURE TODAY AT THE [facility]: Refer to the procedure report that was given to you for any specific questions about what was found during the examination.  If the procedure report does not answer your questions, please call your gastroenterologist to clarify.  If you requested that your care partner not be given the details of your procedure findings, then the procedure report has been included in a sealed envelope for you to review at your convenience later.  YOU SHOULD EXPECT: Some feelings of bloating in the abdomen. Passage of more gas than usual.  Walking can help get rid of the air that was put into your GI tract during the procedure and reduce the bloating. If you had a lower endoscopy (such as a colonoscopy or flexible sigmoidoscopy) you may notice spotting of blood in your stool or on the toilet paper. If you underwent a bowel prep for your procedure, then you may not have a normal bowel movement for a few days.  DIET: Your first meal following the procedure should be a light meal and then it is ok to progress to your normal diet.  A half-sandwich or bowl of soup is an example of a good first meal.  Heavy or fried foods are harder to digest and may make you feel nauseous or bloated.  Likewise meals heavy in dairy and vegetables can cause extra gas to form and this can also increase the bloating.  Drink plenty of fluids but you should avoid alcoholic beverages for 24 hours.  ACTIVITY: Your care partner should take you home directly after the procedure.  You should plan to take it easy, moving slowly for the rest of the day.  You can resume normal activity the day after the procedure however you should NOT DRIVE or use heavy machinery for 24 hours (because of the sedation medicines used during the test).    SYMPTOMS TO REPORT IMMEDIATELY: A gastroenterologist can be reached at any hour.  During normal business hours, 8:30 AM to 5:00 PM Monday through Friday,  call [phone].  After hours and on weekends, please call the GI answering service at [phone] who will take a message and have the physician on call contact you.   Following lower endoscopy (colonoscopy or flexible sigmoidoscopy):  Excessive amounts of blood in the stool  Significant tenderness or worsening of abdominal pains  Swelling of the abdomen that is new, acute  Fever of 100F or higher    FOLLOW UP: If any biopsies were taken you will be contacted by phone or by letter within the next 1-3 weeks.  Call your gastroenterologist if you have not heard about the biopsies in 3 weeks.  Our staff will call the home number listed on your records the next business day following your procedure to check on you and address any questions or concerns that you may have at that time regarding the information given to you following your procedure. This is a courtesy call and so if there is no answer at the home number and we have not heard from you through the emergency physician on call, we will assume that you have returned to your regular daily activities without incident.  SIGNATURES/CONFIDENTIALITY: You and/or your care partner have signed paperwork which will be entered into your electronic medical record.  These signatures attest to the fact that that the information above on your After Visit Summary has been reviewed and is understood.  Full responsibility of the confidentiality   of this discharge information lies with you and/or your care-partner.     

## 2011-06-14 NOTE — Op Note (Signed)
[facility] [address] [place], [place]  [id number]  COLONOSCOPY PROCEDURE REPORT  PATIENT:  Anne, [name]  MR#:  [id number] BIRTHDATE:  11-07-1945, 65 yrs. old  GENDER:  female ENDOSCOPIST:  Gatha Mayer, MD, [facility]  PROCEDURE DATE:  06/14/2011 PROCEDURE:  Colonoscopy with biopsy ASA CLASS:  Class II INDICATIONS:  surveillance and high-risk screening, history of colon cancer, history of pre-cancerous (adenomatous) colon polyps colon cancer removed 2006 MEDICATIONS:   These medications were titrated to patient response per physician's verbal order, Fentanyl 50 mcg IV, Versed 4 mg IV  DESCRIPTION OF PROCEDURE:   After the risks benefits and alternatives of the procedure were thoroughly explained, informed consent was obtained.  Digital rectal exam was performed and revealed no abnormalities.   The [id number] endoscope was introduced through the anus and advanced to the anastomosis, without limitations.  The quality of the prep was excellent, using MoviPrep.  The instrument was then slowly withdrawn as the colon was fully examined. <<PROCEDUREIMAGES>>  FINDINGS:  A diminutive polyp (2-50mm) was found in the mid transverse colon. The polyp was removed using cold biopsy forceps. This was otherwise a normal examination of the colon. S/p right hemicolectomy.   Retroflexed views in the rectum revealed internal hemorrhoids.    The time to cecum = 1:38 minutes. The scope was then withdrawn in 6:47 minutes from the cecum and the procedure completed. COMPLICATIONS:  None ENDOSCOPIC IMPRESSION: 1) Diminutive polyp in the mid transverse colon - removed  2) Internal hemorrhoids 3) Otherwise normal examination, excellent prep 4) Personal history of colon cancer (2006), adenoma (2003)  REPEAT EXAM:  In for Colonoscopy, pending biopsy results. likely 5 years  Gatha Mayer, MD, [facility]  CC:  Dorena Bodo, MD [facility]), The Patient  n. eSIGNED:    Gatha Mayer at 06/14/2011 03:33 PM  Sharlynn Oliphant, [id number]

## 2011-06-15 ENCOUNTER — Telehealth: Payer: Self-pay | Admitting: *Deleted

## 2011-06-15 NOTE — Telephone Encounter (Signed)
  Follow up Call-  Call back number 06/14/2011  Post procedure Call Back phone  # [phone]  Permission to leave phone message Yes     Patient questions:  Do you have a fever, pain , or abdominal swelling? no Pain Score  0 *  Have you tolerated food without any problems? yes  Have you been able to return to your normal activities? yes  Do you have any questions about your discharge instructions: Diet   no Medications  no Follow up visit  no  Do you have questions or concerns about your Care? no  Actions: * If pain score is 4 or above: No action needed, pain <4.

## 2011-06-21 ENCOUNTER — Encounter: Payer: Self-pay | Admitting: Internal Medicine

## 2011-06-21 NOTE — Progress Notes (Signed)
Quick Note:  No true polyps Repeat colonoscopy about 06/2016 ______

## 2011-06-23 ENCOUNTER — Ambulatory Visit (INDEPENDENT_AMBULATORY_CARE_PROVIDER_SITE_OTHER): Payer: Medicare Other | Admitting: Family Medicine

## 2011-06-23 VITALS — BP 162/70 | HR 76 | Ht 64.0 in | Wt 149.0 lb

## 2011-06-23 DIAGNOSIS — M7710 Lateral epicondylitis, unspecified elbow: Secondary | ICD-10-CM

## 2011-06-23 DIAGNOSIS — M7711 Lateral epicondylitis, right elbow: Secondary | ICD-10-CM

## 2011-06-23 DIAGNOSIS — R42 Dizziness and giddiness: Secondary | ICD-10-CM

## 2011-06-23 NOTE — Patient Instructions (Signed)
Thank you for coming in today. Please buy a firm wrist brace for your tennis elbow.  We will call you about physical therapy.  See me in 2 weeks.  Let me know if you get dizzy again.  Record your blood sugar when you get dizzy.  Take care and see you soon.

## 2011-06-27 ENCOUNTER — Encounter: Payer: Self-pay | Admitting: Family Medicine

## 2011-06-27 NOTE — Progress Notes (Signed)
[name] is a 66 y.o. female who presents to [facility] today for a fall the day prior to presentation. [name] became dizzy and fell over.  She describes her usual pattern of vertigo which caused to lose her balance and fall in the bathroom. She denies any syncope, presyncope, chest pains, palpitations, or shortness of breath preceding her fall. She checks her blood sugar a little while later it was 215.  She feels well currently without any vertigo.  She denies any new medications. She denies any residual pain.   Additionally she notes continued right elbow pain.  She is taking some pain medicine but has not done much for home physical therapy exercises. Additionally she purchased an elbow pad but not a tennis elbow forearm strap.    PMH reviewed. 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 Last measured June 2012 ROS as above otherwise neg Medications reviewed. Current Outpatient Prescriptions  Medication Sig Dispense Refill  . amLODipine (NORVASC) 10 MG tablet Take 1 tablet (10 mg total) by mouth daily.  30 tablet  5  . aspirin 81 MG chewable tablet Chew 81 mg by mouth daily.        . carvedilol (COREG) 25 MG tablet Take 1 tablet (25 mg total) by mouth 2 (two) times daily. For blood pressure and heart  60 tablet  12  . desloratadine (CLARINEX) 5 MG tablet Take 1 tablet (5 mg total) by mouth daily. For allergies  30 tablet  12  . glucose blood (TRUETRACK TEST) test strip Use as instructed. Dispense 50 strips       . insulin glargine (LANTUS) 100 UNIT/ML injection Inject 30 Units into the skin daily. Dispense 1 month supply  10 mL  6  . nitroGLYCERIN (NITROSTAT) 0.4 MG SL tablet Place 0.4 mg under the tongue every 5 (five) minutes as needed. If chest pain not relieved by third tab call 911.      . quinapril-hydrochlorothiazide (ACCURETIC) 20-12.5 MG per tablet Take 2 tablets by mouth daily. For blood pressure  60 tablet  12    Exam:  BP  162/70  Pulse 76  Ht 5\' 4"  (1.626 m)  Wt 149 lb (67.586 kg)  BMI 25.58 kg/m2 Gen: Well NAD HEENT: EOMI,  MMM, pupils equal reactive to light. No contusions on skull or face Lungs: CTABL Nl WOB Heart: RRR no MRG Abd: NABS, NT, ND Exts: Non edematous BL  LE, warm and well perfused.  Neuro: Alert and oriented x3. Cranial nerves 2 through 12 are intact. Strength sensation coordination reflexes are normal. Romberg is negative. He is normal. Rapid alternating movements are normal bilaterally MSK: Normal-appearing right elbow. Mildly tender to palpation over the lateral upper condyle. Pain with wrist extension in the lateral epicondyle.

## 2011-06-27 NOTE — Assessment & Plan Note (Signed)
Not improved with home therapy. Additionally patient purchase the wrong device.  I feel that we have to step up her therapy at this point.  Plan for a right wrist brace and referral to physical therapy.  Followup in 2 weeks.

## 2011-06-27 NOTE — Assessment & Plan Note (Signed)
Patient had vertigo which resulted in a fall the day prior to presentation. We'll work this issue up in the past and did not find any likely causes. She has been to vestibular physical therapy and rehabilitation but was unable to do many visits due to cost.  She denies any palpitations syncope or chest pain preceding her fall therefore I feel that cardiogenic syncope is unlikely.  Plan to do watchful waiting for 2 weeks and reevaluate. I've asked the patient to check her blood pressures at home as well as her sugars at home should she get dizzy again.  She will followup soon.

## 2011-07-05 ENCOUNTER — Ambulatory Visit (INDEPENDENT_AMBULATORY_CARE_PROVIDER_SITE_OTHER): Payer: Medicare Other | Admitting: Family Medicine

## 2011-07-05 ENCOUNTER — Encounter: Payer: Self-pay | Admitting: Family Medicine

## 2011-07-05 VITALS — BP 136/68 | Temp 98.0°F | Ht 64.0 in | Wt 151.0 lb

## 2011-07-05 DIAGNOSIS — M7710 Lateral epicondylitis, unspecified elbow: Secondary | ICD-10-CM

## 2011-07-05 DIAGNOSIS — Z23 Encounter for immunization: Secondary | ICD-10-CM

## 2011-07-05 DIAGNOSIS — M7711 Lateral epicondylitis, right elbow: Secondary | ICD-10-CM

## 2011-07-05 DIAGNOSIS — Z780 Asymptomatic menopausal state: Secondary | ICD-10-CM

## 2011-07-05 MED ORDER — CELECOXIB 100 MG PO CAPS: 100.0000 mg | ORAL_CAPSULE | Freq: Two times a day (BID) | ORAL | Status: AC | PRN

## 2011-07-05 MED ORDER — CYCLOBENZAPRINE HCL 10 MG PO TABS: 10.0000 mg | ORAL_TABLET | Freq: Three times a day (TID) | ORAL | Status: AC | PRN

## 2011-07-05 NOTE — Progress Notes (Signed)
[name] is a 66 y.o. female who presents to [facility] today for   1) followup for right lateral epicondylitis.  In the interim she found an old bottle of Celebrex and at the nape muscle relaxer.  The Celebrex and one pill of the muscle relaxer helped control her right elbow pain.  She feels completely normal now.  She did not need a wrist brace.  She would like to refill the Celebrex and have some muscle relaxers on standby in case she has worsening pain.  2) health maintenance: Due for DEXA scan, Tdap, and Pneumovax.   PMH, SH reviewed: Significant for hypertension and diabetes ROS as above otherwise neg. No Chest pain, palpitations, SOB, Fever, Chills, Abd pain, N/V/D.  Medications reviewed. Current Outpatient Prescriptions  Medication Sig Dispense Refill  . amLODipine (NORVASC) 10 MG tablet Take 1 tablet (10 mg total) by mouth daily.  30 tablet  5  . aspirin 81 MG chewable tablet Chew 81 mg by mouth daily.        . carvedilol (COREG) 25 MG tablet Take 1 tablet (25 mg total) by mouth 2 (two) times daily. For blood pressure and heart  60 tablet  12  . celecoxib (CELEBREX) 100 MG capsule Take 1 capsule (100 mg total) by mouth 2 (two) times daily as needed for pain.  60 capsule  6  . cyclobenzaprine (FLEXERIL) 10 MG tablet Take 1 tablet (10 mg total) by mouth 3 (three) times daily as needed for muscle spasms.  30 tablet  6  . desloratadine (CLARINEX) 5 MG tablet Take 1 tablet (5 mg total) by mouth daily. For allergies  30 tablet  12  . glucose blood (TRUETRACK TEST) test strip Use as instructed. Dispense 50 strips       . insulin glargine (LANTUS) 100 UNIT/ML injection Inject 30 Units into the skin daily. Dispense 1 month supply  10 mL  6  . nitroGLYCERIN (NITROSTAT) 0.4 MG SL tablet Place 0.4 mg under the tongue every 5 (five) minutes as needed. If chest pain not relieved by third tab call 911.      . quinapril-hydrochlorothiazide (ACCURETIC) 20-12.5 MG per tablet Take 2 tablets by mouth daily.  For blood pressure  60 tablet  12    Exam:  BP 136/68  Temp(Src) 98 F (36.7 C) (Oral)  Ht 5\' 4"  (1.626 m)  Wt 151 lb (68.493 kg)  BMI 25.92 kg/m2 Gen: Well NAD Lungs: CTABL Nl WOB Heart: RRR no MRG Abd: NABS, NT, ND MSK: Nontender over lateral epicondyle. Normal wrist extensor strength.

## 2011-07-05 NOTE — Progress Notes (Signed)
Addended by: [place] Pink on: 07/05/2011 04:38 PM   Modules accepted: Orders

## 2011-07-05 NOTE — Patient Instructions (Signed)
Thank you for coming in today. Come back in 2-3 months for a recheck.  Come back sooner if you worsen.  I refilled your test strips.  Take the prescription to the pharmacy for the Tetanus vaccine. We will give it to you when you bring it back.  We will set up an osteoporosis test.  I am leaving at the end of June. We will see each other before I go.  Thank you for helping me learn how to be a good doctor.

## 2011-07-05 NOTE — Assessment & Plan Note (Signed)
Much improved with Celebrex and a muscle relaxer.  Plan to refill Celebrex and prescribe Flexeril as needed.  Followup in 2-3 months or sooner if needed.

## 2011-07-15 ENCOUNTER — Ambulatory Visit
Admission: RE | Admit: 2011-07-15 | Discharge: 2011-07-15 | Disposition: A | Discharge status: Home / Self Care | Payer: Medicare Other | Source: Ambulatory Visit | Attending: Family Medicine | Admitting: Family Medicine

## 2011-07-15 DIAGNOSIS — Z780 Asymptomatic menopausal state: Secondary | ICD-10-CM

## 2011-09-27 ENCOUNTER — Ambulatory Visit (INDEPENDENT_AMBULATORY_CARE_PROVIDER_SITE_OTHER): Payer: Medicare Other | Admitting: Family Medicine

## 2011-09-27 ENCOUNTER — Encounter: Payer: Self-pay | Admitting: Family Medicine

## 2011-09-27 VITALS — BP 180/82 | HR 77 | Temp 98.4°F | Ht 64.0 in | Wt 150.0 lb

## 2011-09-27 DIAGNOSIS — M7710 Lateral epicondylitis, unspecified elbow: Secondary | ICD-10-CM

## 2011-09-27 DIAGNOSIS — E1142 Type 2 diabetes mellitus with diabetic polyneuropathy: Secondary | ICD-10-CM

## 2011-09-27 DIAGNOSIS — E1149 Type 2 diabetes mellitus with other diabetic neurological complication: Secondary | ICD-10-CM

## 2011-09-27 DIAGNOSIS — M7711 Lateral epicondylitis, right elbow: Secondary | ICD-10-CM

## 2011-09-27 DIAGNOSIS — I10 Essential (primary) hypertension: Secondary | ICD-10-CM

## 2011-09-27 DIAGNOSIS — Z79899 Other long term (current) drug therapy: Secondary | ICD-10-CM

## 2011-09-27 LAB — POCT GLYCOSYLATED HEMOGLOBIN (HGB A1C): Hemoglobin A1C: 9.3

## 2011-09-27 MED ORDER — QUINAPRIL-HYDROCHLOROTHIAZIDE 20-12.5 MG PO TABS: 1.0000 | ORAL_TABLET | Freq: Every day | ORAL | Status: DC

## 2011-09-27 NOTE — Assessment & Plan Note (Signed)
Poorly controlled diabetes. Patient has run out of glucose strips. I wrote a new prescription for glucometer strips and asked patient to check her sugar 3 times a day and bring the log or the meter to the next appointment in one month. Likely will be titrating Lantus up at that visit

## 2011-09-27 NOTE — Assessment & Plan Note (Signed)
Resolved

## 2011-09-27 NOTE — Assessment & Plan Note (Signed)
Hypertension is less than ideally controlled. Patient has not been taking one of her combo pills. She developed some lightheadedness symptoms. Plan to reduce the ACE inhibitor/hydrochlorothiazide dose in half and followup in one month. Will need to reck LDL assessment at that visit as well

## 2011-09-27 NOTE — Patient Instructions (Signed)
Thank you for coming in today. 1) Blood pressure: Please take amlodipine daily and Quinapril-HCTZ 1 pill daily Come back in 1 month for a re-check.  2) Diabtes: Please check your sugar 3x a day for 1 month and come back.  3) Elbow: It is looking good. Let me know if you do not get better.   Call or go to the emergency room if you get worse, have trouble breathing, have chest pains, or palpitations.

## 2011-09-27 NOTE — Progress Notes (Signed)
[name] is a 66 y.o. female who presents to [facility] today for   1) Follow up Elbow: Lateral epicondylitis in March.  Doing much better today without pain.   2) BP: Patient is taking amlodipine but is not taking her Quinapril hydrochlorothiazide regularly.  She notes that she takes 2 pills as indicated daily she becomes lightheaded. She last took this medication 4 days ago. She denies any chest pain palpitations dyspnea or edema. She denies any syncope.  3) DM: Agent is taking insulin as noted below. However she is out of strips and doesn't know what her blood sugars have been recently.  She denies any polyuria or polydipsia.   PMH: Reviewed hypertension and diabetes History  Substance Use Topics  . Smoking status: Never Smoker   . Smokeless tobacco: Never Used  . Alcohol Use: No   ROS as above  Medications reviewed. Current Outpatient Prescriptions  Medication Sig Dispense Refill  . amLODipine (NORVASC) 10 MG tablet Take 1 tablet (10 mg total) by mouth daily.  30 tablet  5  . aspirin 81 MG chewable tablet Chew 81 mg by mouth daily.        . carvedilol (COREG) 25 MG tablet Take 1 tablet (25 mg total) by mouth 2 (two) times daily. For blood pressure and heart  60 tablet  12  . desloratadine (CLARINEX) 5 MG tablet Take 1 tablet (5 mg total) by mouth daily. For allergies  30 tablet  12  . glucose blood (TRUETRACK TEST) test strip Use as instructed. Dispense 50 strips       . insulin glargine (LANTUS) 100 UNIT/ML injection Inject 30 Units into the skin daily. Dispense 1 month supply  10 mL  6  . DISCONTD: quinapril-hydrochlorothiazide (ACCURETIC) 20-12.5 MG per tablet Take 2 tablets by mouth daily. For blood pressure  60 tablet  12  . nitroGLYCERIN (NITROSTAT) 0.4 MG SL tablet Place 0.4 mg under the tongue every 5 (five) minutes as needed. If chest pain not relieved by third tab call 911.      . quinapril-hydrochlorothiazide (ACCURETIC) 20-12.5 MG per tablet Take 1 tablet by mouth daily.  For blood pressure  60 tablet  12    Exam:  BP 180/82  Pulse 77  Temp 98.4 F (36.9 C) (Oral)  Ht 5\' 4"  (1.626 m)  Wt 150 lb (68.04 kg)  BMI 25.75 kg/m2 Gen: Well NAD HEENT: EOMI,  MMM Lungs: CTABL Nl WOB Heart: RRR no MRG Abd: NABS, NT, ND Exts: Non edematous BL  LE, warm and well perfused. MSK: Nontender over right lateral epicondyle  Results for orders placed in visit on 09/27/11 (from the past 72 hour(s))  POCT GLYCOSYLATED HEMOGLOBIN (HGB A1C)     Status: Abnormal   Collection Time   09/27/11  3:22 PM      Component Value Range Comment   Hemoglobin A1C 9.3

## 2011-11-08 ENCOUNTER — Ambulatory Visit (INDEPENDENT_AMBULATORY_CARE_PROVIDER_SITE_OTHER): Payer: Medicare Other | Admitting: Family Medicine

## 2011-11-08 ENCOUNTER — Encounter: Payer: Self-pay | Admitting: Family Medicine

## 2011-11-08 VITALS — BP 166/91 | HR 60 | Ht 64.0 in | Wt 148.0 lb

## 2011-11-08 DIAGNOSIS — N898 Other specified noninflammatory disorders of vagina: Secondary | ICD-10-CM

## 2011-11-08 DIAGNOSIS — E1142 Type 2 diabetes mellitus with diabetic polyneuropathy: Secondary | ICD-10-CM

## 2011-11-08 DIAGNOSIS — I10 Essential (primary) hypertension: Secondary | ICD-10-CM

## 2011-11-08 DIAGNOSIS — Z9889 Other specified postprocedural states: Secondary | ICD-10-CM

## 2011-11-08 DIAGNOSIS — E1149 Type 2 diabetes mellitus with other diabetic neurological complication: Secondary | ICD-10-CM

## 2011-11-08 DIAGNOSIS — N949 Unspecified condition associated with female genital organs and menstrual cycle: Secondary | ICD-10-CM | POA: Insufficient documentation

## 2011-11-08 DIAGNOSIS — N9489 Other specified conditions associated with female genital organs and menstrual cycle: Secondary | ICD-10-CM | POA: Insufficient documentation

## 2011-11-08 LAB — POCT WET PREP (WET MOUNT): Clue Cells Wet Prep Whiff POC: NEGATIVE

## 2011-11-08 LAB — POCT URINALYSIS DIPSTICK
Bilirubin, UA: NEGATIVE
Glucose, UA: 500
Ketones, UA: NEGATIVE
Leukocytes, UA: NEGATIVE
Nitrite, UA: NEGATIVE
Spec Grav, UA: 1.02
Urobilinogen, UA: 0.2
pH, UA: 5.5

## 2011-11-08 LAB — LIPID PANEL
Cholesterol: 168 mg/dL (ref 0–200)
HDL: 44 mg/dL (ref >39)
LDL Cholesterol: 58 mg/dL (ref 0–99)
Total CHOL/HDL Ratio: 3.8 Ratio
Triglycerides: 328 mg/dL — ABNORMAL HIGH (ref <150)
VLDL: 66 mg/dL — ABNORMAL HIGH (ref 0–40)

## 2011-11-08 LAB — POCT UA - MICROSCOPIC ONLY

## 2011-11-08 LAB — GLUCOSE, CAPILLARY: Glucose-Capillary: 154 mg/dL — ABNORMAL HIGH (ref 70–99)

## 2011-11-08 MED ORDER — GLIPIZIDE 10 MG PO TABS: 10.0000 mg | ORAL_TABLET | Freq: Two times a day (BID) | ORAL | Status: DC

## 2011-11-08 MED ORDER — QUINAPRIL-HYDROCHLOROTHIAZIDE 20-25 MG PO TABS: 1.0000 | ORAL_TABLET | Freq: Every day | ORAL | Status: DC

## 2011-11-08 MED ORDER — INSULIN GLARGINE 100 UNIT/ML ~~LOC~~ SOLN: 30.0000 [IU] | Freq: Every day | SUBCUTANEOUS | Status: DC

## 2011-11-08 MED ORDER — AMLODIPINE BESYLATE 10 MG PO TABS
10.0000 mg | ORAL_TABLET | Freq: Every day | ORAL | Status: DC
Start: 2011-11-08 — End: 2012-03-12

## 2011-11-08 MED ORDER — CARVEDILOL 12.5 MG PO TABS: 12.5000 mg | ORAL_TABLET | Freq: Two times a day (BID) | ORAL | Status: DC

## 2011-11-08 NOTE — Assessment & Plan Note (Signed)
Urinalysis and wet prep normal. No evidence of UTI. No evidence of yeast infection. External genital area looking normal. Patient to return if worsening of symptoms.

## 2011-11-08 NOTE — Progress Notes (Signed)
Patient ID: [name], female   DOB: 1945/07/30, 66 y.o.   MRN: [id number] Patient ID: [name]    DOB: 01-03-1946, 66 y.o.   MRN: [id number] --- Subjective:  [name] is a 66 y.o.female who presents for follow up on diabetes. Also feels like she may have a yeast infection.  - Vaginal burning and itching. No abdominal pain. Present dysuria. No discharge. X a few days. Has been using hydrocortisone which has not been helping.   - Diabetes: Doesn't check blood sugars. lantus 10 units and glipizide 10 bid. No recent hypoglycemia. No polyuria. No nocturia.  - mole on left eye. H/o cataracts and glaucoma. Needed mole removed before surgery for cataract. This was done on 10/19/11. Went to the ophthalmologist and excision LLL heel margin lesion was done. Biopsy left eye lateral conjunctiva lysing lesion. Diagnosed with punctal stenosis left eye. Epiphora due to insufficient drainage of the left side.   ROS: see HPI Past Medical History: reviewed and updated medications and allergies. Social History: Tobacco: denies  Objective: Filed Vitals:   11/08/11 1332  BP: 166/91  Pulse: 60    Physical Examination:   General appearance - alert, well appearing, and in no distress Neck - supple, no significant adenopathy Chest - clear to auscultation, no wheezes, rales or rhonchi, symmetric air entry Heart - normal rate, regular rhythm, normal S1, S2, no murmurs, rubs, clicks or gallops Abdomen - soft, nontender, nondistended, no masses or organomegaly Extremities - peripheral pulses normal, no pedal edema, no clubbing or cyanosis Pelvic exam - normal vagina, cervix, minimal white discharge

## 2011-11-08 NOTE — Assessment & Plan Note (Signed)
Blood pressure not well controlled at 166/91. Patient compliant with her amlodipine in her ramipril hydrochlorothiazide. Not compliant with Corag which she has only been taking every other day or so because she doesn't like the way it makes her feel. Will increase hydrochlorothiazide from 12.5 mg to 25 mg. Heart rate was 60 without patient having taken any beta blocker. Therefore recommended patient to take Corag 12.5 mg twice daily instead of the 25 mg twice daily (called patient after clinic to discuss the Corag dose change since it had not been discussed during the office visit). Will reassess blood pressure in 3 or 4 weeks.

## 2011-11-08 NOTE — Patient Instructions (Addendum)
For the blood pressure and the heart, please take your coreg every day, twice a day. For the blood pressure, it was elevated, so I would like to increase the dose of the quinapril-hydrochlorothiazide.  Please come back for your blood pressure in 3 weeks. At that time we will get some extra blood work to make sure you are tolerating the medicine.   For the diabetes, I am going to check your sugar and based on that, I will adjust the medication.   I will call you if anything is abnormal about the vaginal itching.

## 2011-11-08 NOTE — Assessment & Plan Note (Addendum)
Patient taking Lantus 10 units once daily and glipizide 10 mg twice daily. Reviewed previous notes which showed that patient was initially prescribed Lantus 30 units daily with glipizide 10 mg twice daily. Fasting CBG in the office today was 154. Not wanting to induce hypoglycemia, will k glipizide 10 mg twice daily and increase  Lantus to 11 units daily. Patient to return in 2-3 weeks with glucose log. Wrote prescription for glucometer, test strips, lancets.

## 2011-11-10 ENCOUNTER — Encounter: Payer: Self-pay | Admitting: Family Medicine

## 2011-11-28 ENCOUNTER — Ambulatory Visit: Payer: Medicare Other | Admitting: Family Medicine

## 2012-01-09 ENCOUNTER — Ambulatory Visit (INDEPENDENT_AMBULATORY_CARE_PROVIDER_SITE_OTHER): Payer: Medicare Other | Admitting: Family Medicine

## 2012-01-09 ENCOUNTER — Encounter: Payer: Self-pay | Admitting: Family Medicine

## 2012-01-09 VITALS — BP 138/69 | HR 58 | Temp 98.0°F | Ht 64.0 in | Wt 153.0 lb

## 2012-01-09 DIAGNOSIS — M25511 Pain in right shoulder: Secondary | ICD-10-CM | POA: Insufficient documentation

## 2012-01-09 DIAGNOSIS — M25519 Pain in unspecified shoulder: Secondary | ICD-10-CM

## 2012-01-09 DIAGNOSIS — R319 Hematuria, unspecified: Secondary | ICD-10-CM | POA: Insufficient documentation

## 2012-01-09 DIAGNOSIS — E1142 Type 2 diabetes mellitus with diabetic polyneuropathy: Secondary | ICD-10-CM

## 2012-01-09 DIAGNOSIS — Z79899 Other long term (current) drug therapy: Secondary | ICD-10-CM

## 2012-01-09 DIAGNOSIS — I10 Essential (primary) hypertension: Secondary | ICD-10-CM

## 2012-01-09 DIAGNOSIS — L299 Pruritus, unspecified: Secondary | ICD-10-CM

## 2012-01-09 DIAGNOSIS — E1149 Type 2 diabetes mellitus with other diabetic neurological complication: Secondary | ICD-10-CM

## 2012-01-09 HISTORY — DX: Pruritus, unspecified: L29.9

## 2012-01-09 LAB — POCT URINALYSIS DIPSTICK
Bilirubin, UA: NEGATIVE
Blood, UA: NEGATIVE
Glucose, UA: 500
Ketones, UA: NEGATIVE
Leukocytes, UA: NEGATIVE
Nitrite, UA: NEGATIVE
Protein, UA: NEGATIVE
Spec Grav, UA: 1.015
Urobilinogen, UA: 0.2
pH, UA: 5.5

## 2012-01-09 LAB — POCT GLYCOSYLATED HEMOGLOBIN (HGB A1C): Hemoglobin A1C: 9.4

## 2012-01-09 MED ORDER — MELOXICAM 7.5 MG PO TABS: 7.5000 mg | ORAL_TABLET | Freq: Every day | ORAL | Status: DC

## 2012-01-09 NOTE — Patient Instructions (Addendum)
An other cream to use for the shoulder is called aspecreme.  Do the exercises that we talked about to strengthen the shoulders. If the pain gets worst or doesn't go away, come back for a steroid injection.   For the diabetes, we are going to increase your lantus to 15 units daily. Increase your lantus by 1unit anytime your suhar is higher than 150 in the morning before eating. Let us know if your sugar ever goes under than 70.

## 2012-01-09 NOTE — Progress Notes (Signed)
Patient ID: [name], female   DOB: 1945-05-24, 66 y.o.   MRN: [id number] Patient ID: [name]    DOB: 16-Feb-1946, 66 y.o.   MRN: [id number] --- Subjective:  [name] is a 66 y.o.female with h/o DM2, CAD, HLD, HTN who presents for follow up of DM2 and also has complaint of right shoulder pain. - DM: 170's fasting. 300's postprandial. No symptoms of hypoglycemia. No new skin ulcers or sores. No loss of sensation in feet. Taking glipiide 10 bid and lantus 10 units qhs. - right shoulder: x3 weeks after a big dog climbed on her. Since then has been having pain in right shoulder, worst at night time. Worst with picking up heavy things, up to 4lbs. Turning over makes it worst. No weakness or loss of sensation in right arm. Has been taking ibuprofen 2tabs of 200mg  daily and alcohol arthritis patch from the [employer] store which have not helped much.  - skin itching: for several months now. On back and head. Has not tried anything for it. No redness. No pain. No obvious rash that she has noticed.  - HTN: no chest pain, no lower extremity swelling,no headache, no shortness of breath. has been taking coreg 12.5, amlodipine 10, quinapril/hctz 20/25.tolerating new increase in hctz.   ROS: see HPI Past Medical History: reviewed and updated medications and allergies. Social History: Tobacco: denies  Objective: Filed Vitals:   01/09/12 1354  BP: 138/69  Pulse: 58  Temp: 98 F (36.7 C)    Physical Examination:   General appearance - alert, well appearing, and in no distress Neck - supple, no significant adenopathy Chest - clear to auscultation, no wheezes, rales or rhonchi, symmetric air entry Heart - normal rate, regular rhythm, normal S1, S2, 2/6 systolic ejection murmur, best heard at right sternal border Abdomen - soft, nontender, nondistended, no masses or organomegaly Extremities - peripheral pulses normal, no pedal edema, microfilament: intact Right shoulder - intact range of motion.  Tenderness to palpation along the glenohumeral joint, empty can test positive on right, stooped position bilaterally. No tenderness to palpation along cervical spine.  Skin - back: no obvious rash or depigmentation.

## 2012-01-09 NOTE — Assessment & Plan Note (Signed)
No obvious explanation. No evidence of tinea versicolor or other rash on exam. Treat symptomatically with hydrocortisone cream OTC and cetirizine or benadryl. LFT's wnl in January.

## 2012-01-09 NOTE — Assessment & Plan Note (Signed)
Improved on HCTZ/quinapril increase to 25/20 from 12.5/20. BP:138/69. Will check BMP for K. Follow up in 1 month with DM

## 2012-01-09 NOTE — Assessment & Plan Note (Addendum)
Likely triggered by initiated event 3 weeks ago. There are some components of rotator cuff injury or impigement. Will treat with mobic daily (Creatinine wnl) and aspecreme locally. Recommended stretches: empty can stretch and trying to make scapulas touch. PT in case this does not improve.

## 2012-01-09 NOTE — Assessment & Plan Note (Signed)
RBC's in last urine sample. REpeat today was negative for blood.

## 2012-01-09 NOTE — Assessment & Plan Note (Signed)
A1C today of 9.4 from 9.3, 3 months ago, Will increase lantus to 15 units daily. Patient instructed on increasing lantus dose per morning CBG. See AVS. No evidence of skin ulcers or loss of sensation. Will arrange for eye exam at next visit. Follow up in 1 month

## 2012-01-10 ENCOUNTER — Telehealth: Payer: Self-pay | Admitting: Family Medicine

## 2012-01-10 LAB — BASIC METABOLIC PANEL
BUN: 27 mg/dL — ABNORMAL HIGH (ref 6–23)
CO2: 28 mEq/L (ref 19–32)
Calcium: 9.7 mg/dL (ref 8.4–10.5)
Chloride: 100 mEq/L (ref 96–112)
Creat: 0.99 mg/dL (ref 0.50–1.10)
Glucose, Bld: 229 mg/dL — ABNORMAL HIGH (ref 70–99)
Potassium: 3.4 mEq/L — ABNORMAL LOW (ref 3.5–5.3)
Sodium: 138 mEq/L (ref 135–145)

## 2012-01-10 MED ORDER — POTASSIUM CHLORIDE ER 10 MEQ PO TBCR: 10.0000 meq | EXTENDED_RELEASE_TABLET | Freq: Every day | ORAL | Status: DC

## 2012-01-10 NOTE — Telephone Encounter (Signed)
Called patient and let her know of her lab results. Mildly low K, likely from increase in HCTZ. Sent KDur 64meq to the pharmacy and will repeat BMP at next visit.

## 2012-02-09 ENCOUNTER — Ambulatory Visit: Payer: Medicare Other | Admitting: Family Medicine

## 2012-02-20 ENCOUNTER — Encounter: Payer: Self-pay | Admitting: Home Health Services

## 2012-02-21 ENCOUNTER — Encounter: Payer: Self-pay | Admitting: Home Health Services

## 2012-03-07 ENCOUNTER — Ambulatory Visit (INDEPENDENT_AMBULATORY_CARE_PROVIDER_SITE_OTHER): Payer: Medicare Other | Admitting: Family Medicine

## 2012-03-07 ENCOUNTER — Encounter: Payer: Self-pay | Admitting: Family Medicine

## 2012-03-07 VITALS — BP 137/73 | HR 65 | Temp 98.4°F | Ht 64.0 in | Wt 151.0 lb

## 2012-03-07 DIAGNOSIS — R32 Unspecified urinary incontinence: Secondary | ICD-10-CM

## 2012-03-07 MED ORDER — OXYBUTYNIN CHLORIDE ER 10 MG PO TB24: 10.0000 mg | ORAL_TABLET | Freq: Every day | ORAL | Status: DC

## 2012-03-07 NOTE — Progress Notes (Signed)
  Subjective:    Patient ID: [name], female    DOB: 06-29-1945, 66 y.o.   MRN: [id number]  HPI  [name] comes in for bladder "leaking."  She says this has happened before, but since Sunday it has been bad and difficulty to control.  She says she is embarrassed and does not want to leave the house.  Any time she laughs, coughs, sneezes she leaks urine, and also sometimes when she is walking. She says she had a hysterectomy many years ago, and then after that she had to have a bladder tac done because she had bladder prolapse.  She denies any fevers, back pain, dysuria.    Review of Systems Pertinent items in HPI    Objective:   Physical Exam BP 137/73  Pulse 65  Temp 98.4 F (36.9 C) (Oral)  Ht 5\' 4"  (1.626 m)  Wt 151 lb (68.493 kg)  BMI 25.92 kg/m2 General appearance: alert, cooperative and no distress Pelvic: external genitalia normal, rectovaginal septum normal and uterus surgically absent, there is small cystocele visible in anterior vaginal wall.        Assessment & Plan:

## 2012-03-07 NOTE — Patient Instructions (Addendum)
Prolapse  Prolapse means the falling down, bulging, dropping, or drooping of a body part. Organs that commonly prolapse include the rectum, small intestine, bladder, urethra, vagina (birth canal), uterus (womb), and cervix. Prolapse occurs when the ligaments and muscle tissue around the rectum, bladder, and uterus are damaged or weakened.  CAUSES  This happens especially with:  Childbirth. Some women feel pelvic pressure or have trouble holding their urine right after childbirth, because of stretching and tearing of pelvic tissues. This generally gets better with time and the feeling usually goes away, but it may return with aging.  Chronic heavy lifting.  Aging.  Menopause, with loss of estrogen production weakening the pelvic ligaments and muscles.  Past pelvic surgery.  Obesity.  Chronic constipation.  Chronic cough. Prolapse may affect a single organ, or several organs may prolapse at the same time. The front wall of the vagina holds up the bladder. The back wall holds up part of the lower intestine, or rectum. The uterus fills a spot in the middle. All these organs can be involved when the ligaments and muscles around the vagina relax too much. This often gets worse when women stop producing estrogen (menopause). SYMPTOMS  Uncontrolled loss of urine (incontinence) with cough, sneeze, straining, and exercise.  More force may be required to have a bowel movement, due to trapping of the stool.  When part of an organ bulges through the opening of the vagina, there is sometimes a feeling of heaviness or pressure. It may feel as though something is falling out. This sensation increases with coughing or bearing down.  If the organs protrude through the opening of the vagina and rub against the clothing, there may be soreness, ulcers, infection, pain, and bleeding.  Lower back pain.  Pushing in the upper or lower part of the vagina, to pass urine or have a bowel movement.  Problems  having sexual intercourse.  Being unable to insert a tampon or applicator. DIAGNOSIS  Usually, a physical exam is all that is needed to identify the problem. During the examination, you may be asked to cough and strain while lying down, sitting up, and standing up. Your caregiver will determine if more testing is required, such as bladder function tests. Some diagnoses are:  Cystocele: Bulging and falling of the bladder into the top of the vagina.  Rectocele: Part of the rectum bulging into the vagina.  Prolapse of the uterus: The uterus falls or drops into the vagina.  Enterocele: Bulging of the top of the vagina, after a hysterectomy (uterus removal), with the small intestine bulging into the vagina. A hernia in the top of the vagina.  Urethrocele: The urethra (urine carrying tube) bulging into the vagina. TREATMENT  In most cases, prolapse needs to be treated only if it produces symptoms. If the symptoms are interfering with your usual daily or sexual activities, treatment may be necessary. The following are some measures that may be used to treat prolapse.  Estrogen may help elderly women with mild prolapse.  Kegel exercises may help mild cases of prolapse, by strengthening and tightening the muscles of the pelvic floor.  Pessaries are used in women who choose not to, or are unable to, have surgery. A pessary is a doughnut-shaped piece of plastic or rubber that is put into the vagina to keep the organs in place. This device must be fitted by your caregiver. Your caregiver will also explain how to care for yourself with the pessary. If it works well for you,   this may be the only treatment required.  Surgery is often the only form of treatment for more severe prolapses. There are different types of surgery available. You should discuss what the best procedure is for you. If the uterus is prolapsed, it may be removed (hysterectomy) as part of the surgical treatment. Your caregiver will  discuss the risks and benefits with you.  Uterine-vaginal suspension (surgery to hold up the organs) may be used, especially if you want to maintain your fertility. No form of treatment is guaranteed to correct the prolapse or relieve the symptoms. HOME CARE INSTRUCTIONS   Wear a sanitary pad or absorbent product if you have incontinence of urine.  Avoid heavy lifting and straining with exercise and work.  Take over-the-counter pain medicine for minor discomfort.  Try taking estrogen or using estrogen vaginal cream.  Try Kegel exercises or use a pessary, before deciding to have surgery.  Do Kegel exercises after having a baby. SEEK MEDICAL CARE IF:   Your symptoms interfere with your daily activities.  You need medicine to help with the discomfort.  You need to be fitted with a pessary.  You notice bleeding from the vagina.  You think you have ulcers or you notice ulcers on the cervix.  You have an oral temperature above 102 F (38.9 C).  You develop pain or blood with urination.  You have bleeding with a bowel movement.  The symptoms are interfering with your sex life.  You have urinary incontinence that interferes with your daily activities.  You lose urine with sexual intercourse.  You have a chronic cough.  You have chronic constipation. Document Released: 10/02/2002 Document Revised: 06/20/2011 Document Reviewed: 04/12/2009 [facility] Patient Information 2013 [employer].   Please try the oxybutynin for the leaking.  If it does not help, please come back and see [place].

## 2012-03-07 NOTE — Assessment & Plan Note (Signed)
Patient with history of bladder prolapse, small cystocele seen on exam today.  She describes a mix of stress incontinence and overflow incontinence.  Will try oxybutynin to see if this will improve her symptoms.  If not, will consider referral back to Urology.

## 2012-03-12 ENCOUNTER — Encounter (HOSPITAL_COMMUNITY): Payer: Self-pay | Admitting: *Deleted

## 2012-03-12 ENCOUNTER — Emergency Department (HOSPITAL_COMMUNITY): Payer: Medicare Other

## 2012-03-12 ENCOUNTER — Emergency Department (HOSPITAL_COMMUNITY)
Admission: EM | Admit: 2012-03-12 | Discharge: 2012-03-12 | Disposition: A | Discharge status: Home / Self Care | Payer: Medicare Other | Attending: Emergency Medicine | Admitting: Emergency Medicine

## 2012-03-12 DIAGNOSIS — E7800 Pure hypercholesterolemia, unspecified: Secondary | ICD-10-CM | POA: Insufficient documentation

## 2012-03-12 DIAGNOSIS — K219 Gastro-esophageal reflux disease without esophagitis: Secondary | ICD-10-CM | POA: Insufficient documentation

## 2012-03-12 DIAGNOSIS — R350 Frequency of micturition: Secondary | ICD-10-CM | POA: Insufficient documentation

## 2012-03-12 DIAGNOSIS — K2970 Gastritis, unspecified, without bleeding: Secondary | ICD-10-CM | POA: Insufficient documentation

## 2012-03-12 DIAGNOSIS — Z79899 Other long term (current) drug therapy: Secondary | ICD-10-CM | POA: Insufficient documentation

## 2012-03-12 DIAGNOSIS — R109 Unspecified abdominal pain: Secondary | ICD-10-CM | POA: Insufficient documentation

## 2012-03-12 DIAGNOSIS — M25579 Pain in unspecified ankle and joints of unspecified foot: Secondary | ICD-10-CM | POA: Insufficient documentation

## 2012-03-12 DIAGNOSIS — E119 Type 2 diabetes mellitus without complications: Secondary | ICD-10-CM | POA: Insufficient documentation

## 2012-03-12 DIAGNOSIS — M79673 Pain in unspecified foot: Secondary | ICD-10-CM

## 2012-03-12 DIAGNOSIS — I10 Essential (primary) hypertension: Secondary | ICD-10-CM | POA: Insufficient documentation

## 2012-03-12 DIAGNOSIS — Z794 Long term (current) use of insulin: Secondary | ICD-10-CM | POA: Insufficient documentation

## 2012-03-12 DIAGNOSIS — Z85038 Personal history of other malignant neoplasm of large intestine: Secondary | ICD-10-CM | POA: Insufficient documentation

## 2012-03-12 LAB — URINALYSIS, ROUTINE W REFLEX MICROSCOPIC
Bilirubin Urine: NEGATIVE
Glucose, UA: 250 mg/dL — AB
Hgb urine dipstick: NEGATIVE
Leukocytes, UA: NEGATIVE
Nitrite: NEGATIVE
Protein, ur: NEGATIVE mg/dL
Specific Gravity, Urine: 1.029 (ref 1.005–1.030)
Urobilinogen, UA: 0.2 mg/dL (ref 0.0–1.0)
pH: 5 (ref 5.0–8.0)

## 2012-03-12 MED ORDER — HYDROCODONE-ACETAMINOPHEN 5-325 MG PO TABS: 1.0000 | ORAL_TABLET | Freq: Once | ORAL | Status: DC

## 2012-03-12 MED ORDER — HYDROCODONE-ACETAMINOPHEN 5-325 MG PO TABS
1.0000 | ORAL_TABLET | Freq: Once | ORAL | Status: AC
  Administered 2012-03-12: 1 via ORAL
  Filled 2012-03-12: qty 1

## 2012-03-12 MED ORDER — HYDROCODONE-ACETAMINOPHEN 5-325 MG PO TABS: 1.0000 | ORAL_TABLET | ORAL | Status: DC | PRN

## 2012-03-12 NOTE — ED Provider Notes (Signed)
History     CSN: [id number]  Arrival date & time 03/12/12  1322   First MD Initiated Contact with Patient 03/12/12 1740      Chief Complaint  Patient presents with  . Foot Pain    right    (Consider location/radiation/quality/duration/timing/severity/associated sxs/prior treatment) Patient is a 66 y.o. female presenting with lower extremity pain and frequency. The history is provided by the patient and a relative.  Foot Pain This is a new problem. The current episode started today. The problem occurs constantly. Pertinent negatives include no chest pain, chills, fever, nausea, vomiting or weakness. Associated symptoms comments: She woke today with right ankle and heel pain that prevents her from walking comfortably. No known injury. No swelling or lesion/ulcerations. The patient has a history of diabetes but no known history of neuropathy. She denies calf pain or swelling. Marland Kitchen  Urinary Frequency Pertinent negatives include no chest pain, chills, fever, nausea, vomiting or weakness. Associated symptoms comments: She has been told she will need a bladder tack and complains of associated urinary leakage and urgency. No fever. She has bilateral flank discomfort that wraps around to the front. No nausea or vomiting. .    Past Medical History  Diagnosis Date  . Colon cancer Nov. 27,  2006  . Hypertension   . GERD (gastroesophageal reflux disease)   . Diabetes mellitus     Type 2  . Hypercholesterolemia   . Gastritis     Past Surgical History  Procedure Date  . Right colectomy Nov. 27, 2006  . Cardiac catheterization 2005  . Tmj arthroplasty   . Laparoscopic nissen fundoplication 0000000  . Repair prolapsedbladder   . Abdominal hysterectomy 1976    Family History  Problem Relation Age of Onset  . Lymphoma Other   . Colon cancer Neg Hx   . Stomach cancer Neg Hx     History  Substance Use Topics  . Smoking status: Never Smoker   . Smokeless tobacco: Never Used  . Alcohol Use:  No    OB History    Grav Para Term Preterm Abortions TAB SAB Ect Mult Living                  Review of Systems  Constitutional: Negative for fever and chills.  Respiratory: Negative.  Negative for shortness of breath.   Cardiovascular: Negative.  Negative for chest pain.  Gastrointestinal: Negative for nausea and vomiting.       See HPI.  Genitourinary: Positive for urgency, frequency and flank pain. Negative for pelvic pain.  Musculoskeletal:       See HPI.  Skin: Negative.   Neurological: Negative.  Negative for weakness.  Psychiatric/Behavioral: Negative for confusion.    Allergies  Review of patient's allergies indicates no known allergies.  Home Medications   Current Outpatient Rx  Name  Route  Sig  Dispense  Refill  . ASPIRIN 81 MG PO CHEW   Oral   Chew 81 mg by mouth daily.           Marland Kitchen CARVEDILOL 12.5 MG PO TABS   Oral   Take 1 tablet (12.5 mg total) by mouth 2 (two) times daily with a meal.   60 tablet   3   . DESLORATADINE 5 MG PO TABS   Oral   Take 5 mg by mouth daily as needed. For allergies         . GLIPIZIDE 10 MG PO TABS   Oral   Take 1  tablet (10 mg total) by mouth 2 (two) times daily before a meal.   60 tablet   2   . INSULIN GLARGINE 100 UNIT/ML [place] SOLN   Subcutaneous   Inject 10 Units into the skin daily. Dispense 1 month supply         . MELOXICAM 7.5 MG PO TABS   Oral   Take 1 tablet (7.5 mg total) by mouth daily.   30 tablet   0   . OXYBUTYNIN CHLORIDE ER 10 MG PO TB24   Oral   Take 1 tablet (10 mg total) by mouth daily.   30 tablet   1   . QUINAPRIL-HYDROCHLOROTHIAZIDE 20-25 MG PO TABS   Oral   Take 1 tablet by mouth daily.   30 tablet   3   . TRAVOPROST (BAK FREE) 0.004 % OP SOLN   Both Eyes   Place 1 drop into both eyes at bedtime.         Marland Kitchen NITROGLYCERIN 0.4 MG SL SUBL   Sublingual   Place 0.4 mg under the tongue every 5 (five) minutes as needed. If chest pain not relieved by third tab call 911.            BP 149/71  Pulse 66  Temp 98.4 F (36.9 C) (Oral)  Resp 18  SpO2 100%  Physical Exam  Constitutional: She is oriented to person, place, and time. She appears well-developed and well-nourished. No distress.  HENT:  Head: Normocephalic.  Mouth/Throat: Oropharynx is clear and moist.  Neck: Normal range of motion. Neck supple.  Cardiovascular: Normal rate and regular rhythm.   No murmur heard. Pulmonary/Chest: Effort normal and breath sounds normal. She has no wheezes. She has no rales.  Abdominal: Soft. Bowel sounds are normal. There is no tenderness. There is no rebound and no guarding.  Genitourinary:       No flank or spinal/paraspinal tenderness.   Musculoskeletal: Normal range of motion. She exhibits no edema and no tenderness.       Right ankle unremarkable in appearance. No swelling, discoloration or deformity. FROM without pain. No palpable tenderness to ankle or heel. No calf tenderness.   Neurological: She is alert and oriented to person, place, and time. Coordination normal.  Skin: Skin is warm and dry.  Psychiatric: She has a normal mood and affect.    ED Course  Procedures (including critical care time)  Labs Reviewed  URINALYSIS, ROUTINE W REFLEX MICROSCOPIC - Abnormal; Notable for the following:    APPearance CLOUDY (*)     Glucose, UA 250 (*)     Ketones, ur TRACE (*)     All other components within normal limits  URINE CULTURE   Dg Ankle Complete Right  03/12/2012  *RADIOLOGY REPORT*  Clinical Data:  Right ankle pain and inability to bear weight.  No known injury.  RIGHT ANKLE - COMPLETE 3+ VIEW  Comparison:  None.  Findings:  There is no evidence of fracture, dislocation, or joint effusion.  There is no evidence of arthropathy or other focal bone abnormality.  Soft tissues are unremarkable.  IMPRESSION: Negative.   Original Report Authenticated By: Earle Gell, M.D.      No diagnosis found. 1. Foot pain   MDM  Urine without infection. No change in  urinary symptoms associated with known bladder drop and followed by her doctor. Ankle evaluation is unremarkable. Patient medicated. Ambulated in room with discomfort but improved. Able to weight bear.  Leotis Shames, PA-C 03/12/12 2003

## 2012-03-12 NOTE — ED Notes (Signed)
Pt states 2 weeks ago started having leakage of urine when coughing, laughing or walking, has had bladder tacked up before, went to PCP and was told bladder has dropped again, was given medication but states has not helped. Pt also states today woke up w/ R heel/foot/ankle pain, denies swelling, redness or injury to foot. Pt also states having flank pain radiating to back, denies urinary symptoms.

## 2012-03-13 LAB — URINE CULTURE: Colony Count: 45000

## 2012-03-13 NOTE — ED Provider Notes (Signed)
Medical screening examination/treatment/procedure(s) were performed by non-physician practitioner and as supervising physician I was immediately available for consultation/collaboration.  Jasper Riling. Alvino Chapel, MD 03/13/12 [phone]

## 2012-03-31 ENCOUNTER — Telehealth: Payer: Self-pay | Admitting: Oncology

## 2012-03-31 NOTE — Telephone Encounter (Signed)
called pt and she is aware that dr lo is not longer here and reassign to [employer]

## 2012-04-14 ENCOUNTER — Encounter: Payer: Self-pay | Admitting: Oncology

## 2012-04-14 ENCOUNTER — Telehealth: Payer: Self-pay

## 2012-04-14 NOTE — Telephone Encounter (Signed)
Confirmed that pt is aware of new dr and appts per prev. Documentation,sch and letter mailed     [name] 04/14/12

## 2012-05-02 ENCOUNTER — Encounter: Payer: Self-pay | Admitting: Family Medicine

## 2012-05-02 ENCOUNTER — Ambulatory Visit (INDEPENDENT_AMBULATORY_CARE_PROVIDER_SITE_OTHER): Payer: Medicare Other | Admitting: Family Medicine

## 2012-05-02 VITALS — BP 153/83 | HR 59 | Ht 64.0 in | Wt 153.0 lb

## 2012-05-02 DIAGNOSIS — N393 Stress incontinence (female) (male): Secondary | ICD-10-CM

## 2012-05-02 DIAGNOSIS — R197 Diarrhea, unspecified: Secondary | ICD-10-CM

## 2012-05-02 DIAGNOSIS — R32 Unspecified urinary incontinence: Secondary | ICD-10-CM

## 2012-05-02 MED ORDER — ONDANSETRON HCL 4 MG PO TABS: 4.0000 mg | ORAL_TABLET | Freq: Three times a day (TID) | ORAL | Status: DC | PRN

## 2012-05-02 NOTE — Progress Notes (Signed)
Patient ID: ORRIE RAVELO    DOB: 1945/09/16, 67 y.o.   MRN: [id number] --- Subjective:  Dierdre is a 67 y.o.female with h/o colon cancer who presents with urinary incontinence and diarrhea.  - urinary incontinence: since November. Started after she lifted something. Occurs with coughing, laughing or any sudden movement. She had a "bladder tuck" in the 1990's and has not had any trouble since. She denies any dysuria, any related abdominal pain. She has needed to wear adult diapers.  She denies any back pain, any lower extremity weakness, any numbness in lower extremities.   - diarrhea: x4days. Watery, non bloody, occurs every time she eats something. She has been able to keep fluids down. Also feels nauseated, no vomiting. No recent antibiotic use, no travel, no recent different diet, no sick contacts.    ROS: see HPI Past Medical History: reviewed and updated medications and allergies. Social History: Tobacco: none  Objective: Filed Vitals:   05/02/12 1530  BP: 153/83  Pulse: 59    Physical Examination:   General appearance - alert, well appearing, and in no distress Abdomen - soft, present bowel movements, diffusely uncomfortable with palpation, no guarding, no rebound. Pelvic - cystocele present, otherwise normal external genitalia, vagina, cervix, uterus and adnexa.

## 2012-05-02 NOTE — Patient Instructions (Addendum)
For the diarrhea, I think it is from a gastroenteritis that can last for up to 7 days. Make sure you stay hydrated. You can drink gatorade as a way to do that.  If it doesn't get any better, come back.  For the nausea, I will prescribe a few tablets of a medicine called zofran for it.    For the bladder, I am sending a referral to a urologist to evaluate you further.

## 2012-05-02 NOTE — Assessment & Plan Note (Signed)
Stress incontinence, likely from cystocele. Will refer to uro-gyn for further evaluation: pessary vs surgery vs pelvic floor muscle rehab

## 2012-05-02 NOTE — Assessment & Plan Note (Signed)
Likely gastroenteritis. Instructed importance of hydration. Reviewed red flags for return (see AVS). No imodium since patient has history of getting constipated post imodium, in the setting of her h/o colon resection.

## 2012-05-10 ENCOUNTER — Other Ambulatory Visit: Payer: Self-pay

## 2012-05-10 DIAGNOSIS — Z85038 Personal history of other malignant neoplasm of large intestine: Secondary | ICD-10-CM

## 2012-05-11 ENCOUNTER — Emergency Department (HOSPITAL_COMMUNITY)
Admission: EM | Admit: 2012-05-11 | Discharge: 2012-05-11 | Discharge status: Absent without leave | Payer: Medicare Other | Source: Home / Self Care | Attending: Emergency Medicine | Admitting: Emergency Medicine

## 2012-05-11 ENCOUNTER — Telehealth: Payer: Self-pay | Admitting: *Deleted

## 2012-05-11 ENCOUNTER — Telehealth: Payer: Self-pay

## 2012-05-11 ENCOUNTER — Encounter (HOSPITAL_COMMUNITY): Payer: Self-pay | Admitting: Cardiology

## 2012-05-11 ENCOUNTER — Telehealth: Payer: Self-pay | Admitting: Family Medicine

## 2012-05-11 ENCOUNTER — Emergency Department (HOSPITAL_COMMUNITY)
Admission: EM | Admit: 2012-05-11 | Discharge: 2012-05-11 | Disposition: A | Discharge status: Home / Self Care | Payer: Medicare Other | Attending: Emergency Medicine | Admitting: Emergency Medicine

## 2012-05-11 ENCOUNTER — Other Ambulatory Visit (HOSPITAL_BASED_OUTPATIENT_CLINIC_OR_DEPARTMENT_OTHER): Payer: Medicare Other | Admitting: Lab

## 2012-05-11 ENCOUNTER — Emergency Department (HOSPITAL_COMMUNITY): Payer: Medicare Other

## 2012-05-11 DIAGNOSIS — Z9889 Other specified postprocedural states: Secondary | ICD-10-CM | POA: Insufficient documentation

## 2012-05-11 DIAGNOSIS — C189 Malignant neoplasm of colon, unspecified: Secondary | ICD-10-CM

## 2012-05-11 DIAGNOSIS — E119 Type 2 diabetes mellitus without complications: Secondary | ICD-10-CM | POA: Insufficient documentation

## 2012-05-11 DIAGNOSIS — Z794 Long term (current) use of insulin: Secondary | ICD-10-CM | POA: Insufficient documentation

## 2012-05-11 DIAGNOSIS — I10 Essential (primary) hypertension: Secondary | ICD-10-CM | POA: Insufficient documentation

## 2012-05-11 DIAGNOSIS — Z7982 Long term (current) use of aspirin: Secondary | ICD-10-CM | POA: Insufficient documentation

## 2012-05-11 DIAGNOSIS — Z862 Personal history of diseases of the blood and blood-forming organs and certain disorders involving the immune mechanism: Secondary | ICD-10-CM | POA: Insufficient documentation

## 2012-05-11 DIAGNOSIS — Z8719 Personal history of other diseases of the digestive system: Secondary | ICD-10-CM | POA: Insufficient documentation

## 2012-05-11 DIAGNOSIS — E876 Hypokalemia: Secondary | ICD-10-CM | POA: Insufficient documentation

## 2012-05-11 DIAGNOSIS — Z85038 Personal history of other malignant neoplasm of large intestine: Secondary | ICD-10-CM

## 2012-05-11 DIAGNOSIS — Z79899 Other long term (current) drug therapy: Secondary | ICD-10-CM | POA: Insufficient documentation

## 2012-05-11 DIAGNOSIS — C182 Malignant neoplasm of ascending colon: Secondary | ICD-10-CM

## 2012-05-11 DIAGNOSIS — E860 Dehydration: Secondary | ICD-10-CM | POA: Insufficient documentation

## 2012-05-11 DIAGNOSIS — R5381 Other malaise: Secondary | ICD-10-CM | POA: Insufficient documentation

## 2012-05-11 DIAGNOSIS — Z8639 Personal history of other endocrine, nutritional and metabolic disease: Secondary | ICD-10-CM | POA: Insufficient documentation

## 2012-05-11 LAB — COMPREHENSIVE METABOLIC PANEL (CC13)
ALT: 8 U/L (ref 0–55)
AST: 12 U/L (ref 5–34)
Albumin: 3.5 g/dL (ref 3.5–5.0)
Alkaline Phosphatase: 63 U/L (ref 40–150)
BUN: 32.8 mg/dL — ABNORMAL HIGH (ref 7.0–26.0)
CO2: 26 mEq/L (ref 22–29)
Calcium: 9.5 mg/dL (ref 8.4–10.4)
Chloride: 101 mEq/L (ref 98–107)
Creatinine: 1.8 mg/dL — ABNORMAL HIGH (ref 0.6–1.1)
Glucose: 272 mg/dl — ABNORMAL HIGH (ref 70–99)
Potassium: 2.9 mEq/L — CL (ref 3.5–5.1)
Sodium: 142 mEq/L (ref 136–145)
Total Bilirubin: 0.64 mg/dL (ref 0.20–1.20)
Total Protein: 7.6 g/dL (ref 6.4–8.3)

## 2012-05-11 LAB — CBC WITH DIFFERENTIAL/PLATELET
BASO%: 0.8 % (ref 0.0–2.0)
Basophils Absolute: 0.1 10*3/uL (ref 0.0–0.1)
EOS%: 1.9 % (ref 0.0–7.0)
Eosinophils Absolute: 0.2 10*3/uL (ref 0.0–0.5)
HCT: 34.5 % — ABNORMAL LOW (ref 34.8–46.6)
HGB: 11.8 g/dL (ref 11.6–15.9)
LYMPH%: 28.4 % (ref 14.0–49.7)
MCH: 29.1 pg (ref 25.1–34.0)
MCHC: 34.2 g/dL (ref 31.5–36.0)
MCV: 85.2 fL (ref 79.5–101.0)
MONO#: 0.5 10*3/uL (ref 0.1–0.9)
MONO%: 4.6 % (ref 0.0–14.0)
NEUT#: 6.4 10*3/uL (ref 1.5–6.5)
NEUT%: 64.3 % (ref 38.4–76.8)
Platelets: 229 10*3/uL (ref 145–400)
RBC: 4.05 10*6/uL (ref 3.70–5.45)
RDW: 13.6 % (ref 11.2–14.5)
WBC: 9.9 10*3/uL (ref 3.9–10.3)
lymph#: 2.8 10*3/uL (ref 0.9–3.3)

## 2012-05-11 LAB — LACTATE DEHYDROGENASE (CC13): LDH: 162 U/L (ref 125–245)

## 2012-05-11 LAB — CEA: CEA: 1.2 ng/mL (ref 0.0–5.0)

## 2012-05-11 LAB — POCT I-STAT TROPONIN I: Troponin i, poc: 0 ng/mL (ref 0.00–0.08)

## 2012-05-11 MED ORDER — POTASSIUM CHLORIDE CRYS ER 20 MEQ PO TBCR: 20.0000 meq | EXTENDED_RELEASE_TABLET | Freq: Every day | ORAL | Status: DC

## 2012-05-11 MED ORDER — SODIUM CHLORIDE 0.9 % IV BOLUS (SEPSIS)
1000.0000 mL | Freq: Once | INTRAVENOUS | Status: AC
  Administered 2012-05-11: 1000 mL via INTRAVENOUS

## 2012-05-11 MED ORDER — POTASSIUM CHLORIDE CRYS ER 20 MEQ PO TBCR
40.0000 meq | EXTENDED_RELEASE_TABLET | Freq: Once | ORAL | Status: AC
  Administered 2012-05-11: 40 meq via ORAL
  Filled 2012-05-11: qty 2

## 2012-05-11 MED ORDER — POTASSIUM CHLORIDE CRYS ER 20 MEQ PO TBCR: 40.0000 meq | EXTENDED_RELEASE_TABLET | Freq: Every day | ORAL | Status: DC

## 2012-05-11 NOTE — ED Notes (Addendum)
Pt reports she had blood work done today at the cancer center today and was called after to come to the ED because lab work was abnormal. Also reports she feels like she has been having palpations over the past 2 days. Also reports some chest pain. Denies any SOB, n/v. Basic labs completed at [facility] and noted tin Epic.

## 2012-05-11 NOTE — ED Provider Notes (Signed)
History     CSN: [id number]  Arrival date & time 05/11/12  [id number]   First MD Initiated Contact with Patient 05/11/12 2144      Chief Complaint  Patient presents with  . Palpitations  . Abnormal Lab    (Consider location/radiation/quality/duration/timing/severity/associated sxs/prior treatment) Patient is a 67 y.o. female presenting with weakness. The history is provided by the patient (the pt complains of mild weakness and has a low potassium). No language interpreter was used.  Weakness Primary symptoms do not include headaches or seizures. The symptoms began 12 to 24 hours ago. The symptoms are unchanged. The neurological symptoms are multifocal. Context: nothing.  Additional symptoms include weakness. Additional symptoms do not include hallucinations. Medical issues also include seizures.    Past Medical History  Diagnosis Date  . Colon cancer Nov. 27,  2006  . Hypertension   . GERD (gastroesophageal reflux disease)   . Diabetes mellitus     Type 2  . Hypercholesterolemia   . Gastritis     Past Surgical History  Procedure Date  . Right colectomy Nov. 27, 2006  . Cardiac catheterization 2005  . Tmj arthroplasty   . Laparoscopic nissen fundoplication 0000000  . Repair prolapsedbladder   . Abdominal hysterectomy 1976    Family History  Problem Relation Age of Onset  . Lymphoma Other   . Colon cancer Neg Hx   . Stomach cancer Neg Hx     History  Substance Use Topics  . Smoking status: Never Smoker   . Smokeless tobacco: Never Used  . Alcohol Use: No    OB History    Grav Para Term Preterm Abortions TAB SAB Ect Mult Living                  Review of Systems  Constitutional: Negative for fatigue.  HENT: Negative for congestion, sinus pressure and ear discharge.   Eyes: Negative for discharge.  Respiratory: Negative for cough.   Cardiovascular: Negative for chest pain.  Gastrointestinal: Negative for abdominal pain and diarrhea.  Genitourinary: Negative  for frequency and hematuria.  Musculoskeletal: Negative for back pain.  Skin: Negative for rash.  Neurological: Positive for weakness. Negative for seizures and headaches.  Hematological: Negative.   Psychiatric/Behavioral: Negative for hallucinations.    Allergies  Review of patient's allergies indicates no known allergies.  Home Medications   Current Outpatient Rx  Name  Route  Sig  Dispense  Refill  . ASPIRIN 81 MG PO CHEW   Oral   Chew 81 mg by mouth daily.           Marland Kitchen CARVEDILOL 12.5 MG PO TABS   Oral   Take 1 tablet (12.5 mg total) by mouth 2 (two) times daily with a meal.   60 tablet   3   . DESLORATADINE 5 MG PO TABS   Oral   Take 5 mg by mouth daily as needed. For allergies         . GLIPIZIDE 10 MG PO TABS   Oral   Take 1 tablet (10 mg total) by mouth 2 (two) times daily before a meal.   60 tablet   2   . INSULIN GLARGINE 100 UNIT/ML [place] SOLN   Subcutaneous   Inject 10 Units into the skin daily. Dispense 1 month supply         . NITROGLYCERIN 0.4 MG SL SUBL   Sublingual   Place 0.4 mg under the tongue every 5 (five) minutes as needed.  If chest pain not relieved by third tab call 911.         . QUINAPRIL-HYDROCHLOROTHIAZIDE 20-25 MG PO TABS   Oral   Take 1 tablet by mouth daily.   30 tablet   3   . TRAVOPROST (BAK FREE) 0.004 % OP SOLN   Both Eyes   Place 1 drop into both eyes at bedtime.         Marland Kitchen POTASSIUM CHLORIDE CRYS ER 20 MEQ PO TBCR   Oral   Take 2 tablets (40 mEq total) by mouth daily.   2 tablet   0     Take one tablet tonight and one tablet tomorrow. T ...   . POTASSIUM CHLORIDE CRYS ER 20 MEQ PO TBCR   Oral   Take 1 tablet (20 mEq total) by mouth daily.   5 tablet   0     BP 160/93  Pulse 68  Temp 98.2 F (36.8 C) (Oral)  Resp 18  SpO2 99%  Physical Exam  Constitutional: She is oriented to person, place, and time. She appears well-developed.  HENT:  Head: Normocephalic and atraumatic.  Eyes: Conjunctivae  normal and EOM are normal. No scleral icterus.  Neck: Neck supple. No thyromegaly present.  Cardiovascular: Normal rate and regular rhythm.  Exam reveals no gallop and no friction rub.   No murmur heard. Pulmonary/Chest: No stridor. She has no wheezes. She has no rales. She exhibits no tenderness.  Abdominal: She exhibits no distension. There is no tenderness. There is no rebound.  Musculoskeletal: Normal range of motion. She exhibits no edema.  Lymphadenopathy:    She has no cervical adenopathy.  Neurological: She is oriented to person, place, and time. Coordination normal.  Skin: No rash noted. No erythema.  Psychiatric: She has a normal mood and affect. Her behavior is normal.    ED Course  Procedures (including critical care time)   Labs Reviewed  POCT I-STAT TROPONIN I   Dg Chest 2 View  05/11/2012  *RADIOLOGY REPORT*  Clinical Data: Palpitations, history of colon cancer, abnormal labs  CHEST - 2 VIEW  Comparison: 09/23/2010  Findings: Lungs are clear. No pleural effusion or pneumothorax.  Cardiomediastinal silhouette is within normal limits.  Visualized osseous structures are within normal limits.  Surgical clips in the left upper abdomen.  IMPRESSION: No evidence of acute cardiopulmonary disease.   Original Report Authenticated By: Julian Hy, M.D.      1. Hypokalemia   2. Dehydration     .ededkg  Date: 05/11/2012  Rate: 65  Rhythm: normal sinus rhythm  QRS Axis: normal  Intervals: normal  ST/T Wave abnormalities: normal  Conduction Disutrbances:none  Narrative Interpretation:   Old EKG Reviewed: none available    MDM          Maudry Diego, MD 05/11/12 2312

## 2012-05-11 NOTE — Telephone Encounter (Addendum)
Received call at  4:45 PM from [facility]   from Dr. Rolan Bucco office reporting labs have come back at 4:25 that were drawn today  . Potassium 2.9  The labs were drawn for an upcoming appointment next week with Dr. Ralene Ok. She was a patient of Dr. Jamse Arn but will now be seeing Dr. Ralene Ok.  He has not seen her before.    She is calling to give the  report to Dr. Otis Dials.  Dr. Otis Dials notified. Labs are in [place].   Patient also called back stating  she owes $6.00 at nephrologist office and will go Monday to pay so appointment can then be scheduled.

## 2012-05-11 NOTE — Telephone Encounter (Signed)
Called patient about urology referral and the fact that [facility] will not set up referral until patient calls their billing department.  Instructed her to do so and gave her the office number. Patient agreed.   [name], PGY-2 Family Medicine Resident

## 2012-05-11 NOTE — ED Notes (Signed)
Xray results reviewed, brought into triage area for lab draw (per protocol orders).

## 2012-05-11 NOTE — Telephone Encounter (Signed)
S/w Jeani Hawking at [facility]. Pt K=2.9. Jeani Hawking stated she would give message to Dr Otis Dials. Had spoken with pt before calling Dr Otis Dials that her K is low and will be calling her PCP.

## 2012-05-11 NOTE — Telephone Encounter (Signed)
Was notified of abnormal labs from the cancer center that drew labs today. Patient's potassium is 2.9 and her Creatinine is 1.8 above her baseline in September of 0.99. Talked with patient and she tells me that she is not feeling very well: heart racing, muscle spasms, head spinning with standing. She had diarrhea when I saw her on 05/02/12 and she states that this has improved. She now has stomach pain with nausea, no vomiting. Poor appetite.  Given the fact that she is not feeling well and I can't assess her fluid status over the phone, I recommended that she go to Urgent Care tonight to be evaluated tonight for possible IV fluid needs.  Will send an Rx for KDur 40 x2 pills for her to take tonight and tomorrow, with close follow up on Monday with [facility] for labs and visit, in case for some reason, she did not go to Urgent Care tonight.  Patient expressed understanding.  [name], PGY-2 Family Medicine Resident

## 2012-05-14 ENCOUNTER — Telehealth: Payer: Self-pay | Admitting: Family Medicine

## 2012-05-14 NOTE — Telephone Encounter (Signed)
Patient needs a referral to go to Dr. Matilde Sprang, and that Appt is on 2/11, does Dr. Otis Dials need her to come in after this appt.

## 2012-05-15 ENCOUNTER — Ambulatory Visit: Payer: Medicare Other | Admitting: Hematology and Oncology

## 2012-05-15 NOTE — Telephone Encounter (Signed)
Called pt. We have faxed referral already.( see notes in referral) Informed to call [facility] # [phone]. Pt agreed and will call them. Javier Glazier, Gerrit Heck

## 2012-05-18 ENCOUNTER — Telehealth: Payer: Self-pay | Admitting: Oncology

## 2012-05-18 ENCOUNTER — Ambulatory Visit (HOSPITAL_BASED_OUTPATIENT_CLINIC_OR_DEPARTMENT_OTHER): Payer: Medicare Other | Admitting: Oncology

## 2012-05-18 ENCOUNTER — Encounter: Payer: Self-pay | Admitting: Oncology

## 2012-05-18 VITALS — BP 151/59 | HR 66 | Temp 97.9°F | Resp 20 | Ht 64.0 in | Wt 157.9 lb

## 2012-05-18 DIAGNOSIS — Z85038 Personal history of other malignant neoplasm of large intestine: Secondary | ICD-10-CM

## 2012-05-18 NOTE — Patient Instructions (Signed)
Be sure to contact Dr. Sherwood Gambler for follow-up MRI regarding your meningioma.

## 2012-05-18 NOTE — Telephone Encounter (Signed)
Gave pt appt for MD only 05/25/12 pt sent to labs today

## 2012-05-18 NOTE — Progress Notes (Signed)
This office note has been dictated.  [id number]

## 2012-05-21 NOTE — Progress Notes (Signed)
CC:   [name], M.D. Anne Mayer, MD,FACG  [facility], Fax [phone]  PROBLEM LIST: 1. 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  The patient underwent     laparoscopic assisted right colectomy by Dr. Jackolyn [name] on     03/07/2005.  The primary tumor measured 5.5 cm.  The cancer invaded     through the muscularis propria into the underlying pericolonic     fibroadipose tissue.  Lymphovascular invasion was identified.     Surgical margins were negative.  All 10 lymph nodes were negative.     Tumor stage was T3 N0 IIA.  The patient received adjuvant     chemotherapy by [name] consisting of 4 cycles of     FOLFOX and 8 cycles of chemotherapy with 5 fluorouracil leucovorin     and 5FU by continuous infusion from 04/27/2005 through 10/13/2005.     The patient remains without evidence of disease. 2. Meningioma.  The patient apparently sees [name] and is     supposed to be having yearly imaging studies.  She did have an MRI     of the brain with and without IV contrast on 09/23/2010.  There was     a right parafalcine meningioma in the frontal region measuring 8 x     8 x 9 mm.  This was only a mm larger than seen previously on the     MRI of 04/09/2009. 3. Hypertension. 4. Diabetes mellitus. 5. GERD. 6. Prolapsed bladder.   MEDICATIONS:  Reviewed and recorded. Current Outpatient Prescriptions  Medication Sig Dispense Refill  . aspirin 81 MG chewable tablet Chew 81 mg by mouth daily.        . carvedilol (COREG) 12.5 MG tablet Take 1 tablet (12.5 mg total) by mouth 2 (two) times daily with a meal.  60 tablet  3  . desloratadine (CLARINEX) 5 MG tablet Take 5 mg by mouth daily as needed. For allergies      . glipiZIDE (GLUCOTROL) 10 MG tablet Take 1 tablet (10 mg total) by mouth 2 (two) times daily before a meal.  60 tablet  2  . insulin glargine (LANTUS) 100 UNIT/ML  injection Inject 10 Units into the skin daily. Dispense 1 month supply      . nitroGLYCERIN (NITROSTAT) 0.4 MG SL tablet Place 0.4 mg under the tongue every 5 (five) minutes as needed. If chest pain not relieved by third tab call 911.      . potassium chloride SA (K-DUR,KLOR-CON) 20 MEQ tablet Take 2 tablets (40 mEq total) by mouth daily.  2 tablet  0  . quinapril-hydrochlorothiazide (ACCURETIC) 20-25 MG per tablet Take 1 tablet by mouth daily.  30 tablet  3  . Travoprost, BAK Free, (TRAVATAN) 0.004 % SOLN ophthalmic solution Place 1 drop into both eyes at bedtime.       No current facility-administered medications for this visit.     SMOKING HISTORY:  The patient has never smoked cigarettes.    HISTORY:  [name] is a 67 year old African American woman who is here today for followup of her history of a stage IIA colon cancer dating back to September 2006.  The patient is here today with her daughter, [name].  The patient was last seen here by [name] on 05/04/2011.  The patient denies any changes in her condition that would suggest a recurrence of her colon cancer.  She apparently is having some urinary difficulties and will be having surgery for prolapsed bladder or cystocele within the next few weeks.  She does occasionally have loose stools but this is apparently chronic.  There is no evidence of blood in her stools, melena or lower abdominal pain.  No nausea or vomiting. Appetite is good and her weight has been stable.  The patient says that she is supposed to be having yearly brain scans under the supervision of [name].  Apparently her last MRI of the brain was on 09/23/2010. She is also due for mammogram which was last carried out on 05/09/2011. The patient did have a colonoscopy carried out on 06/14/2011.  There were internal hemorrhoids.  Biopsy of a transverse colon polyp showed benign polypoid colorectal mucosa without dysplasia or malignancy identified.  The  patient will be due for another colonoscopy in 5 years.  I recommended to the patient's daughter that she have a colonoscopy. She has never had a colonoscopy.  Presently she does not have insurance. The patient has Medicare and Medicaid.  PHYSICAL EXAMINATION:  The patient looks well.  Weight is stable 157.8 pounds.  Height 5 feet 4 inches.  Body surface area 1.8 sq m.  Blood pressure 151/59.  Other vital signs are normal.  There is no scleral icterus.  Mouth and pharynx are benign.  There is no peripheral adenopathy palpable.  Heart and lungs are normal.  Abdomen is somewhat obese, nontender with no organomegaly or masses palpable.  Extremities: No peripheral edema.  Neurologic exam was normal.  LABORATORY DATA:  From 05/11/2012 white count was 9.9, ANC 6.4, hemoglobin 11.8, hematocrit 34.5, platelets 229,000.  Chemistries from 05/11/2012 notable for glucose of 272 and a potassium of 2.9.  The patient was informed about her low potassium and apparently went to the emergency room to receive supplemental IV and p.o. potassium.  BUN was 33 and creatinine 1.8.  On 01/09/2012 BUN was 27, creatinine 0.99.  IMAGING STUDIES: 1. Digital screening mammogram on 05/09/2011 was negative. 2. Chest x-ray, 2 view, on 05/11/2012 was negative.   IMPRESSION AND PLAN:  [name] continues to do well, now about 7-1/2 years from the diagnosis of her colon cancer without evidence of recurrence.  The patient is up to date, having had her colonoscopy on 06/14/2011.  I have urged her to call [name] office for followup and probably another MRI of the brain, given her history of meningioma. I have also urged the patient's daughter that she needs to have a colonoscopy as soon as she has insurance coverage.  The patient has requested followup in 1 year.  We will plan to check CBC and chemistries at that time.  Extensive records on this patient were reviewed in preparation for  this appointment.    ______________________________ [name], M.D. DSM/MEDQ  D:  05/18/2012  T:  05/18/2012  Job:  [id number]

## 2012-06-27 ENCOUNTER — Encounter: Payer: Self-pay | Admitting: *Deleted

## 2012-09-27 ENCOUNTER — Telehealth: Payer: Self-pay | Admitting: *Deleted

## 2012-09-27 NOTE — Telephone Encounter (Signed)
Wrong chart

## 2012-10-03 ENCOUNTER — Ambulatory Visit: Payer: Medicare Other | Admitting: Family Medicine

## 2012-10-17 ENCOUNTER — Ambulatory Visit (INDEPENDENT_AMBULATORY_CARE_PROVIDER_SITE_OTHER): Payer: Medicare Other | Admitting: Family Medicine

## 2012-10-17 ENCOUNTER — Encounter: Payer: Self-pay | Admitting: Family Medicine

## 2012-10-17 VITALS — BP 144/78 | HR 61 | Temp 98.0°F | Wt 155.0 lb

## 2012-10-17 DIAGNOSIS — R109 Unspecified abdominal pain: Secondary | ICD-10-CM

## 2012-10-17 DIAGNOSIS — IMO0002 Reserved for concepts with insufficient information to code with codable children: Secondary | ICD-10-CM

## 2012-10-17 DIAGNOSIS — E1165 Type 2 diabetes mellitus with hyperglycemia: Secondary | ICD-10-CM

## 2012-10-17 DIAGNOSIS — E1149 Type 2 diabetes mellitus with other diabetic neurological complication: Secondary | ICD-10-CM

## 2012-10-17 DIAGNOSIS — R1013 Epigastric pain: Secondary | ICD-10-CM

## 2012-10-17 DIAGNOSIS — R519 Headache, unspecified: Secondary | ICD-10-CM | POA: Insufficient documentation

## 2012-10-17 DIAGNOSIS — R51 Headache: Secondary | ICD-10-CM

## 2012-10-17 DIAGNOSIS — R42 Dizziness and giddiness: Secondary | ICD-10-CM

## 2012-10-17 DIAGNOSIS — IMO0001 Reserved for inherently not codable concepts without codable children: Secondary | ICD-10-CM

## 2012-10-17 LAB — COMPREHENSIVE METABOLIC PANEL
ALT: 8 U/L (ref 0–35)
AST: 13 U/L (ref 0–37)
Albumin: 4 g/dL (ref 3.5–5.2)
Alkaline Phosphatase: 58 U/L (ref 39–117)
BUN: 31 mg/dL — ABNORMAL HIGH (ref 6–23)
CO2: 27 mEq/L (ref 19–32)
Calcium: 9.8 mg/dL (ref 8.4–10.5)
Chloride: 102 mEq/L (ref 96–112)
Creat: 1.21 mg/dL — ABNORMAL HIGH (ref 0.50–1.10)
Glucose, Bld: 213 mg/dL — ABNORMAL HIGH (ref 70–99)
Potassium: 3.6 mEq/L (ref 3.5–5.3)
Sodium: 138 mEq/L (ref 135–145)
Total Bilirubin: 0.4 mg/dL (ref 0.3–1.2)
Total Protein: 7.1 g/dL (ref 6.0–8.3)

## 2012-10-17 LAB — LIPASE: Lipase: 47 U/L (ref 0–75)

## 2012-10-17 MED ORDER — CARVEDILOL 25 MG PO TABS: 25.0000 mg | ORAL_TABLET | Freq: Two times a day (BID) | ORAL | Status: DC

## 2012-10-17 MED ORDER — ESOMEPRAZOLE MAGNESIUM 40 MG PO PACK: 40.0000 mg | PACK | Freq: Every day | ORAL | Status: DC

## 2012-10-17 MED ORDER — QUINAPRIL-HYDROCHLOROTHIAZIDE 20-12.5 MG PO TABS: 2.0000 | ORAL_TABLET | Freq: Every day | ORAL | Status: DC

## 2012-10-17 MED ORDER — GLUCOSE BLOOD VI STRP: ORAL_STRIP | Status: DC

## 2012-10-17 MED ORDER — GLIPIZIDE 10 MG PO TABS: 10.0000 mg | ORAL_TABLET | Freq: Two times a day (BID) | ORAL | Status: DC

## 2012-10-17 MED ORDER — INSULIN GLARGINE 100 UNIT/ML ~~LOC~~ SOLN: 10.0000 [IU] | Freq: Every day | SUBCUTANEOUS | Status: DC

## 2012-10-17 NOTE — Patient Instructions (Addendum)
For the diabetes, we are going to increase your lantus to 12 units daily.  Check your sugars in the morning before eating and also check your sugar after eating at noon time.   For the abdominal pain, I am getting an ultrasound to check your gallbladder.  Start back on the nexium.   I'll call you with the lab results.   I'm referring you back to Dr. Electa Sniff.   You need a mammogram.   Follow up with me in 2 weeks.

## 2012-10-17 NOTE — Assessment & Plan Note (Signed)
A1C of 9.1 in June 2014 by insurance company. Increase lantus to 12 units from 10units daily.  Refilled strips.

## 2012-10-17 NOTE — Progress Notes (Signed)
Patient ID: TYISHIA STCLAIR    DOB: 1945-05-11, 67 y.o.   MRN: [id number] --- Subjective:  [name] is a 67 y.o.female with h/o HTN, DM2, vertigo, meningioma, h/o colon cancer who presents with abdominal pain and headache.   - abdominal pain: started in June. occurs with eating. Otherwise, pain doesn't occur. Pain Located in umbilical area. Difficulty describing nature of pain. Pain also occurs with bowel movement.  Nausea and emesis of clear sputum, no blood. Nausea Occurs with bad abdominal pain or with bowel movement.  BM every day: mostly diarrhea, loose stools, no blood. Stools occur after eating Poor appetite.  Also having some acid reflux since she ran out of nexium for 2 months.   - vertigo:  Fall episode: occurred after episode of dizziness and she lost consciousness for a few seconds. Occurred June 17th.Got dizzy and fell. Now having more falls with sharp turns or bending over. Pain along the right forehead. Improved in the last week but still present.    Lightheadedness when standing from sitting. Feeling of room spinning, no ringing in ear.  Used to take meclizine and valium which helped.   - DM2: CBG's: 170 after eating. Never lower than 70. No numbness or tingling in feet. Takes lantus 10units daily and glipizide 10mg  bid.     ROS: see HPI Past Medical History: reviewed and updated medications and allergies. Social History: Tobacco: none, no past history  Objective: Filed Vitals:   10/17/12 1448  BP: 144/78  Pulse: 61  Temp: 98 F (36.7 C)    Physical Examination:   General appearance - alert, well appearing, and in no distress Ears - bilateral TM's and external ear canals normal Nose - erythematous and congested nasal turbinates bilaterally No sinus tenderness.  Mouth - mucous membranes moist, pharynx normal without lesions Neck - supple, normal range of motion without significant pain.  Chest - clear to auscultation, no wheezes, rales or rhonchi, symmetric air  entry Heart - normal rate, regular rhythm, normal S1, S2, no murmurs Abdomen - soft, tender in epigatric area and right upper quadrant, no rebound, no guarding.  Neuro - CN2-12 grossly intact, no nystagmus appreciated.  5/5 upper extremity strength bilaterally Reproducible dizziness with head movement

## 2012-10-17 NOTE — Assessment & Plan Note (Addendum)
Differential includes:  Cholecystitis vs biliary colic vs GERD vs peptic ulcer disease History of diarrhea makes IBS or diverticulitis a possibility as well, but exam is more convincing for a stomach or gastric etiology. Unclear reason for diarrhea at this time.  - Will check abdominal ultrasound - CMP today  - lipase today (although pancreatitis less likely) - refilled PPI

## 2012-10-17 NOTE — Assessment & Plan Note (Addendum)
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 Will hold on treating with meclizine and valium for now, while we work her up for her abdominal pain and headache. Will reassess at next visit. Patient to follow up in 2 weeks.

## 2012-10-17 NOTE — Assessment & Plan Note (Addendum)
Persistent frontal headache with h/o fall 2-3 weeks ago. No focal findings on neuro exam, but would like to obtain head CT to rule out chronic subdural hematoma.  Referral to neurosurgery for h/o of meningioma. Patient had follow up but never went last year.

## 2012-10-18 NOTE — Addendum Note (Signed)
Addended by: Carter Kitten on: 10/18/2012 11:17 AM   Modules accepted: Orders

## 2012-10-19 ENCOUNTER — Ambulatory Visit (HOSPITAL_COMMUNITY)
Admission: RE | Admit: 2012-10-19 | Discharge: 2012-10-19 | Disposition: A | Discharge status: Home / Self Care | Payer: Medicare Other | Source: Ambulatory Visit | Attending: Family Medicine | Admitting: Family Medicine

## 2012-10-19 ENCOUNTER — Other Ambulatory Visit (HOSPITAL_COMMUNITY): Payer: Medicare Other

## 2012-10-19 DIAGNOSIS — R51 Headache: Secondary | ICD-10-CM

## 2012-10-19 DIAGNOSIS — R109 Unspecified abdominal pain: Secondary | ICD-10-CM

## 2012-10-19 DIAGNOSIS — D320 Benign neoplasm of cerebral meninges: Secondary | ICD-10-CM | POA: Insufficient documentation

## 2012-10-31 ENCOUNTER — Encounter (INDEPENDENT_AMBULATORY_CARE_PROVIDER_SITE_OTHER): Payer: Medicare Other | Admitting: Ophthalmology

## 2012-10-31 DIAGNOSIS — H35039 Hypertensive retinopathy, unspecified eye: Secondary | ICD-10-CM

## 2012-10-31 DIAGNOSIS — E1139 Type 2 diabetes mellitus with other diabetic ophthalmic complication: Secondary | ICD-10-CM

## 2012-10-31 DIAGNOSIS — I10 Essential (primary) hypertension: Secondary | ICD-10-CM

## 2012-10-31 DIAGNOSIS — E11359 Type 2 diabetes mellitus with proliferative diabetic retinopathy without macular edema: Secondary | ICD-10-CM

## 2012-10-31 DIAGNOSIS — E1165 Type 2 diabetes mellitus with hyperglycemia: Secondary | ICD-10-CM

## 2012-10-31 DIAGNOSIS — H4310 Vitreous hemorrhage, unspecified eye: Secondary | ICD-10-CM

## 2012-11-09 ENCOUNTER — Ambulatory Visit: Payer: Medicare Other | Admitting: Family Medicine

## 2012-11-15 ENCOUNTER — Encounter: Payer: Self-pay | Admitting: Family Medicine

## 2012-11-15 ENCOUNTER — Other Ambulatory Visit (INDEPENDENT_AMBULATORY_CARE_PROVIDER_SITE_OTHER): Payer: Medicare Other | Admitting: Ophthalmology

## 2012-11-15 DIAGNOSIS — H3581 Retinal edema: Secondary | ICD-10-CM

## 2012-11-21 ENCOUNTER — Ambulatory Visit (INDEPENDENT_AMBULATORY_CARE_PROVIDER_SITE_OTHER): Payer: Medicare Other | Admitting: Family Medicine

## 2012-11-21 ENCOUNTER — Encounter: Payer: Self-pay | Admitting: Family Medicine

## 2012-11-21 VITALS — BP 134/80 | HR 54 | Ht 64.8 in | Wt 153.0 lb

## 2012-11-21 DIAGNOSIS — IMO0002 Reserved for concepts with insufficient information to code with codable children: Secondary | ICD-10-CM

## 2012-11-21 DIAGNOSIS — IMO0001 Reserved for inherently not codable concepts without codable children: Secondary | ICD-10-CM

## 2012-11-21 DIAGNOSIS — E1165 Type 2 diabetes mellitus with hyperglycemia: Secondary | ICD-10-CM

## 2012-11-21 DIAGNOSIS — I10 Essential (primary) hypertension: Secondary | ICD-10-CM

## 2012-11-21 DIAGNOSIS — R197 Diarrhea, unspecified: Secondary | ICD-10-CM

## 2012-11-21 DIAGNOSIS — R42 Dizziness and giddiness: Secondary | ICD-10-CM

## 2012-11-21 DIAGNOSIS — E119 Type 2 diabetes mellitus without complications: Secondary | ICD-10-CM

## 2012-11-21 DIAGNOSIS — R1013 Epigastric pain: Secondary | ICD-10-CM

## 2012-11-21 LAB — POCT URINALYSIS DIPSTICK
Bilirubin, UA: NEGATIVE
Blood, UA: NEGATIVE
Glucose, UA: NEGATIVE
Leukocytes, UA: NEGATIVE
Nitrite, UA: NEGATIVE
Protein, UA: NEGATIVE
Spec Grav, UA: 1.025
Urobilinogen, UA: 0.2
pH, UA: 5.5

## 2012-11-21 LAB — POCT GLYCOSYLATED HEMOGLOBIN (HGB A1C): Hemoglobin A1C: 7.7

## 2012-11-21 MED ORDER — CARVEDILOL 12.5 MG PO TABS: 12.5000 mg | ORAL_TABLET | Freq: Two times a day (BID) | ORAL | Status: DC

## 2012-11-21 MED ORDER — DIAZEPAM 5 MG PO TABS: 5.0000 mg | ORAL_TABLET | Freq: Three times a day (TID) | ORAL | Status: DC | PRN

## 2012-11-21 NOTE — Progress Notes (Signed)
Patient ID: [name]    DOB: 05/08/1945, 67 y.o.   MRN: [id number] --- Subjective:  [name] is a 67 y.o.female who presents for follow up and review of labs.   - dizziness: every day, no head trauma. Lasts a few minutes. Worst with bending over and head in certain positions. No nausea. This has been ongoing for several years, but she Feels like it got worst after eye surgery where head was in backward position.  Valium helped in the past.   - DM2: CBG's: lowest 94, high: 389, average: 180.  Lantus: 16 units and glucotrol 10 mg. No numbness or tingling in lower extremities, no ulcers or sores. She has been seen by ophthalmology by a retinal specialist and had surgeries on July 23rd on right eye and left august 7th. Scheduled for surgery on Aug 22nd on left.  Denies any change in vision.   - BP: takes coreg 25mg  bid and quinapril/hctz and logs BP every day: last week had BP of 79/44 where she felt weak and light headed. She didn't take her meds that day.  Low BP in 80's happened before that week.  She denies any chest pain, shortness of breath or lower extremity swelling.   - abdominal pain: middle of umbilicus, crampy after eating. No given foods cause this. Associated with diarrhea. Usually better after BM. No blood in stool. Has had h/o diarrhea since colectomy for colon cancer.  She has also some heartburn which is improved with nexium. She tries to avoid spicy foods.   ROS: see HPI Past Medical History: reviewed and updated medications and allergies. Social History: Tobacco:  Objective: BP:134/80, HR: 54, Height: 5'4'', weight: 153lbs  Physical Examination:   General appearance - alert, well appearing, and in no distress Chest - no increased work of breathing Heart - normal rate  Neuro - no focal deficits, CN2-12 grossly intact

## 2012-11-21 NOTE — Assessment & Plan Note (Addendum)
Patient actively trying to improve diabetes as it has greatly contributed to her eye disease and her recent surgeries. A1C improved at 7.7 today compared to 9.1.  Increase lantus to 18 units. Still on glipizide. Reviewed signs and symptoms of hypoglycemia. Patient to call clinic in case of hypoglycemia, which she has not had. She may benefit from novolog in the future and d/cing glipizide. Will monitor and follow up in 1 month.

## 2012-11-21 NOTE — Assessment & Plan Note (Signed)
MRI head done in context of her falling before last visit was negative. Continues to be a problem. Appears to have been triggered by head position during one of her eye surgeries.  Will try valium low dose for symptom relief and reassess in 1 month. This has worked for her in the past.

## 2012-11-21 NOTE — Patient Instructions (Addendum)
For the diabetes, increase the lantus to 18 units daily. If your sugar drops lower than 70, drink juice and recheck your sugar. If it happens more than twice a week, call the clinic   For the blood pressure, take coreg 12.5mg  twice a day.   For the dizziness, we'll try valium.   Follow up with me in 1 month to check on the blood pressure.

## 2012-11-21 NOTE — Assessment & Plan Note (Signed)
Hypotension in [id number] systolic and average BP less than 123456 systolic. Will decrease coreg from 25mg  bid to 12.5mg  bid.  Patient to follow up in 1 month.

## 2012-11-21 NOTE — Assessment & Plan Note (Signed)
Improved with nexium. US abdomen without any acute findings. monitor .

## 2012-11-21 NOTE — Assessment & Plan Note (Signed)
Associated with cramping. Relieve with bowel movement and related to food intake. May be IBS. She has had chronic diarrhea since colectomy. Last colonoscopy from 2013 was normal.  Will continue to monitor. If continues to be a problem, will refer her back to GI.

## 2012-11-22 LAB — BASIC METABOLIC PANEL
BUN: 22 mg/dL (ref 6–23)
CO2: 25 mEq/L (ref 19–32)
Calcium: 10 mg/dL (ref 8.4–10.5)
Chloride: 103 mEq/L (ref 96–112)
Creat: 1.17 mg/dL — ABNORMAL HIGH (ref 0.50–1.10)
Glucose, Bld: 90 mg/dL (ref 70–99)
Potassium: 3.5 mEq/L (ref 3.5–5.3)
Sodium: 140 mEq/L (ref 135–145)

## 2012-11-29 ENCOUNTER — Encounter: Payer: Self-pay | Admitting: Family Medicine

## 2012-11-30 ENCOUNTER — Other Ambulatory Visit (INDEPENDENT_AMBULATORY_CARE_PROVIDER_SITE_OTHER): Payer: Medicare Other | Admitting: Ophthalmology

## 2012-11-30 DIAGNOSIS — H3581 Retinal edema: Secondary | ICD-10-CM

## 2013-02-04 ENCOUNTER — Ambulatory Visit (INDEPENDENT_AMBULATORY_CARE_PROVIDER_SITE_OTHER): Payer: Medicare Other | Admitting: Family Medicine

## 2013-02-04 ENCOUNTER — Encounter: Payer: Self-pay | Admitting: Family Medicine

## 2013-02-04 VITALS — BP 153/89 | HR 58 | Temp 98.3°F | Ht 64.0 in | Wt 155.0 lb

## 2013-02-04 DIAGNOSIS — R0609 Other forms of dyspnea: Secondary | ICD-10-CM

## 2013-02-04 DIAGNOSIS — E1165 Type 2 diabetes mellitus with hyperglycemia: Secondary | ICD-10-CM

## 2013-02-04 DIAGNOSIS — I10 Essential (primary) hypertension: Secondary | ICD-10-CM

## 2013-02-04 DIAGNOSIS — IMO0001 Reserved for inherently not codable concepts without codable children: Secondary | ICD-10-CM

## 2013-02-04 DIAGNOSIS — Z23 Encounter for immunization: Secondary | ICD-10-CM

## 2013-02-04 DIAGNOSIS — R0600 Dyspnea, unspecified: Secondary | ICD-10-CM

## 2013-02-04 DIAGNOSIS — IMO0002 Reserved for concepts with insufficient information to code with codable children: Secondary | ICD-10-CM

## 2013-02-04 LAB — POCT GLYCOSYLATED HEMOGLOBIN (HGB A1C): Hemoglobin A1C: 7.4

## 2013-02-04 MED ORDER — FLUTICASONE PROPIONATE 50 MCG/ACT NA SUSP: 2.0000 | Freq: Every day | NASAL | Status: DC

## 2013-02-04 MED ORDER — GLUCOSE BLOOD VI STRP: ORAL_STRIP | Status: DC

## 2013-02-04 NOTE — Patient Instructions (Addendum)
For the nasal congestion, I recommend that we try an allergy spray. Take it every day. This should help with the congestion and hoarseness.  For the diabetes, continue taking the Lantus 16 units every day. Continue with the glipizide.  For your shortness of breath, I would like to make sure that your heart squeezing fine. We're therefore getting a ultrasound of your heart.  Follow up in 3 weeks to go over the results of the ultrasound and to see how your shortness of breath is doing.  I also need to get cholesterol labs. Make an appointment for lab any time this week or next week. You need to be fasting, no food or drink other than water after midnight.

## 2013-02-04 NOTE — Assessment & Plan Note (Signed)
A1c improved to 7.4 from 7.7. In 67 year old patient with other comorbidities, will keep goal A1C To less than 8. Continue Lantus and glipizide. Patient is a retinal specialist. Obtain lipid panel. Depending on the results we'll start a moderate or high intensity statin.

## 2013-02-04 NOTE — Progress Notes (Signed)
Patient ID: [name]    DOB: 29-Jan-1946, 67 y.o.   MRN: [id number] --- Subjective:  [name] is a 67 y.o.female who presents for follow up on diabetes and hypertension. Her additional concern is shortness of breath.   - Shortness of breath: Starting 3 weeks ago. States that she is more short winded with going from one side of her house to the other. She also needs to stop for rest when she goes upstairs. She denies any associated chest pain. She denies any lower extremity swelling. Denies palpitations. She denies any coughing. She does report some nasal congestion. She also feels like sometimes her throat gets raspy.  - Diabetes: Check her CBGs before meals in the morning. They run in the 110's to 120s. She denies any lows lower than 70. She denies any symptoms of hypoglycemia. She takes Lantus 16 units sometimes every other day. She also takes glipizide 10 mg. She denies any numbness or tingling lower extremities. She has an appointment with the retinal specialist coming up.  - Hypertension: Checks her blood pressures at home every day. Sometimes as low as 80/60, but only very occasionally. Usually in the 130s/80s. Denies any chest pain. Denies any lower extremity swelling. Takes her blood pressure medicine every day. Denies skipping any doses.  ROS: see HPI Past Medical History: reviewed and updated medications and allergies. Social History: Tobacco: never smoker  Objective: Filed Vitals:   02/04/13 1350  BP: 153/89  Pulse: 58  Temp: 98.3 F (36.8 C)   O2: 100%  Physical Examination:   General appearance - alert, well appearing, and in no distress Ears - bilateral TM's and external ear canals normal Nose - erythematous and congested nasal turbinates bilaterally. Mouth - mucous membranes moist, pharynx normal without lesions, torus palatinus present Neck - supple, no significant adenopathy Chest - clear to auscultation, no wheezes, rales or rhonchi, symmetric air entry Heart -  normal rate, regular rhythm, normal S1, S2, no murmurs Extremities - peripheral pulses normal, no pedal edema, sensation to light touch and to microfilament intact.   A1C: 7.4

## 2013-02-04 NOTE — Addendum Note (Signed)
Addended by: Levert Feinstein F on: 02/04/2013 04:08 PM   Modules accepted: Orders

## 2013-02-04 NOTE — Assessment & Plan Note (Addendum)
Unclear etiology of her dyspnea. She does have some anxiety at baseline which could be contributing to it. However, given her history of CAD, cannot exclude that this is cardiac. We'll therefore obtain a 2-D echo. I will also treat her underlying nasal congestion with Flonase. She also has a history of anemia, which if worsening could be also contributing to her dyspnea. Will therefore get a CBC when she gets lab work for her lipid panel. Follow up in 3 weeks to go over results.

## 2013-02-04 NOTE — Assessment & Plan Note (Signed)
Repeat blood pressure better: 140/78. With reported blood pressures at home in the [id number] systolic, will continue with the current management.

## 2013-02-13 ENCOUNTER — Other Ambulatory Visit (HOSPITAL_COMMUNITY): Payer: Medicare Other

## 2013-02-18 ENCOUNTER — Ambulatory Visit (HOSPITAL_COMMUNITY)
Admission: RE | Admit: 2013-02-18 | Discharge: 2013-02-18 | Disposition: A | Discharge status: Home / Self Care | Payer: Medicare Other | Source: Ambulatory Visit | Attending: Family Medicine | Admitting: Family Medicine

## 2013-02-18 DIAGNOSIS — I369 Nonrheumatic tricuspid valve disorder, unspecified: Secondary | ICD-10-CM

## 2013-02-18 DIAGNOSIS — R0989 Other specified symptoms and signs involving the circulatory and respiratory systems: Secondary | ICD-10-CM | POA: Insufficient documentation

## 2013-02-18 DIAGNOSIS — I079 Rheumatic tricuspid valve disease, unspecified: Secondary | ICD-10-CM | POA: Insufficient documentation

## 2013-02-18 DIAGNOSIS — R0600 Dyspnea, unspecified: Secondary | ICD-10-CM

## 2013-02-18 DIAGNOSIS — I059 Rheumatic mitral valve disease, unspecified: Secondary | ICD-10-CM | POA: Insufficient documentation

## 2013-02-18 DIAGNOSIS — R0609 Other forms of dyspnea: Secondary | ICD-10-CM | POA: Insufficient documentation

## 2013-02-18 NOTE — Progress Notes (Signed)
  Echocardiogram 2D Echocardiogram has been performed.  Diamond Nickel 02/18/2013, 12:38 PM

## 2013-04-01 ENCOUNTER — Ambulatory Visit (INDEPENDENT_AMBULATORY_CARE_PROVIDER_SITE_OTHER): Payer: Medicare Other | Admitting: Ophthalmology

## 2013-04-01 DIAGNOSIS — E1139 Type 2 diabetes mellitus with other diabetic ophthalmic complication: Secondary | ICD-10-CM

## 2013-04-01 DIAGNOSIS — E11359 Type 2 diabetes mellitus with proliferative diabetic retinopathy without macular edema: Secondary | ICD-10-CM

## 2013-04-01 DIAGNOSIS — H35039 Hypertensive retinopathy, unspecified eye: Secondary | ICD-10-CM

## 2013-04-01 DIAGNOSIS — E11319 Type 2 diabetes mellitus with unspecified diabetic retinopathy without macular edema: Secondary | ICD-10-CM

## 2013-04-01 DIAGNOSIS — H43819 Vitreous degeneration, unspecified eye: Secondary | ICD-10-CM

## 2013-04-01 DIAGNOSIS — I10 Essential (primary) hypertension: Secondary | ICD-10-CM

## 2013-04-16 ENCOUNTER — Telehealth: Payer: Self-pay | Admitting: Internal Medicine

## 2013-04-16 NOTE — Telephone Encounter (Signed)
returned pt called and confirmed Feb appt...pt ok and aware

## 2013-05-17 ENCOUNTER — Other Ambulatory Visit: Payer: Self-pay | Admitting: Internal Medicine

## 2013-05-17 ENCOUNTER — Ambulatory Visit (HOSPITAL_BASED_OUTPATIENT_CLINIC_OR_DEPARTMENT_OTHER): Payer: Medicare Other | Admitting: Internal Medicine

## 2013-05-17 ENCOUNTER — Other Ambulatory Visit (HOSPITAL_BASED_OUTPATIENT_CLINIC_OR_DEPARTMENT_OTHER): Payer: Medicare Other

## 2013-05-17 ENCOUNTER — Telehealth: Payer: Self-pay | Admitting: Internal Medicine

## 2013-05-17 VITALS — BP 152/72 | HR 54 | Temp 98.9°F | Resp 20 | Ht 64.0 in | Wt 151.9 lb

## 2013-05-17 DIAGNOSIS — Z85038 Personal history of other malignant neoplasm of large intestine: Secondary | ICD-10-CM

## 2013-05-17 LAB — CBC WITH DIFFERENTIAL/PLATELET
BASO%: 1.3 % (ref 0.0–2.0)
Basophils Absolute: 0.1 10*3/uL (ref 0.0–0.1)
EOS%: 2.8 % (ref 0.0–7.0)
Eosinophils Absolute: 0.2 10*3/uL (ref 0.0–0.5)
HCT: 32.6 % — ABNORMAL LOW (ref 34.8–46.6)
HGB: 11 g/dL — ABNORMAL LOW (ref 11.6–15.9)
LYMPH%: 35.3 % (ref 14.0–49.7)
MCH: 29 pg (ref 25.1–34.0)
MCHC: 33.6 g/dL (ref 31.5–36.0)
MCV: 86.4 fL (ref 79.5–101.0)
MONO#: 0.5 10*3/uL (ref 0.1–0.9)
MONO%: 6.5 % (ref 0.0–14.0)
NEUT#: 4.3 10*3/uL (ref 1.5–6.5)
NEUT%: 54.1 % (ref 38.4–76.8)
Platelets: 209 10*3/uL (ref 145–400)
RBC: 3.78 10*6/uL (ref 3.70–5.45)
RDW: 13.1 % (ref 11.2–14.5)
WBC: 8 10*3/uL (ref 3.9–10.3)
lymph#: 2.8 10*3/uL (ref 0.9–3.3)

## 2013-05-17 LAB — COMPREHENSIVE METABOLIC PANEL (CC13)
ALT: 9 U/L (ref 0–55)
AST: 13 U/L (ref 5–34)
Albumin: 3.8 g/dL (ref 3.5–5.0)
Alkaline Phosphatase: 67 U/L (ref 40–150)
Anion Gap: 11 mEq/L (ref 3–11)
BUN: 27 mg/dL — ABNORMAL HIGH (ref 7.0–26.0)
CO2: 27 mEq/L (ref 22–29)
Calcium: 10.2 mg/dL (ref 8.4–10.4)
Chloride: 105 mEq/L (ref 98–109)
Creatinine: 1.4 mg/dL — ABNORMAL HIGH (ref 0.6–1.1)
Glucose: 139 mg/dl (ref 70–140)
Potassium: 3.6 mEq/L (ref 3.5–5.1)
Sodium: 143 mEq/L (ref 136–145)
Total Bilirubin: 0.82 mg/dL (ref 0.20–1.20)
Total Protein: 7.4 g/dL (ref 6.4–8.3)

## 2013-05-17 LAB — LACTATE DEHYDROGENASE (CC13): LDH: 178 U/L (ref 125–245)

## 2013-05-17 NOTE — Telephone Encounter (Signed)
gv adn printed appt sched for Feb 2014 and 2016.Anne KitchenMarland [name]

## 2013-05-17 NOTE — Progress Notes (Signed)
[name] OFFICE PROGRESS NOTE  Anne Graham, MD [address] [place] [place] [address]  DIAGNOSIS: History of colon cancer - Plan: CBC with Differential, Comprehensive metabolic panel (Cmet) - [facility], [facility], [facility] Digital Screening  Chief Complaint  Patient presents with  . History of colon cancer   CURRENT THERAPY: Observation.  INTERVAL HISTORY: [name] 68 y.o. female with a history of a stage IIA colon cancer dating back to September 2006 is here for follow-up. The patient was last seen here by [name] on 05/18/2012. The patient denies any changes in her condition that would  suggest a recurrence of her colon cancer.  There is no evidence of blood in her stools, melena or lower abdominal pain. No nausea or vomiting. Her appetite is good and her weight has been stable. She is following with [name] with her last MRI of the brain was on 09/23/2010.    MEDICAL HISTORY: Past Medical History  Diagnosis Date  . Colon cancer Nov. 27,  2006  . Hypertension   . GERD (gastroesophageal reflux disease)   . Diabetes mellitus     Type 2  . Hypercholesterolemia   . Gastritis     INTERIM HISTORY: has History of colon cancer; MENINGIOMA; DM (diabetes mellitus), type 2, uncontrolled; HYPERTRIGLYCERIDEMIA; ANEMIA, IRON DEFICIENCY, UNSPEC.; ANXIETY; DEPRESSIVE DISORDER, NOS; MORTON'S NEUROMA; HYPERTENSION, BENIGN ESSENTIAL; CORONARY, ARTERIOSCLEROSIS; EXTERNAL HEMORRHOIDS; RHINITIS, ALLERGIC NOS; GASTROESOPHAGEAL REFLUX, NO ESOPHAGITIS; COLONIC POLYPS, ADENOMATOUS, BENIGN; FIBROADENOSIS, BREAST; VERTIGO, CHRONIC; Lateral epicondylitis of right elbow; Post-menopausal; Right shoulder pain; Pruritus; Hematuria - cause not known; Urine incontinence; Diarrhea; Abdominal pain, epigastric; Headache(784.0); and Dyspnea on her problem list.    ALLERGIES:  has No Known Allergies.  MEDICATIONS: has a current medication list which includes the following prescription(s): aspirin,  carvedilol, desloratadine, diazepam, esomeprazole, fluticasone, glipizide, glucose blood, insulin glargine, nitroglycerin, quinapril-hydrochlorothiazide, and travoprost (bak free).  SURGICAL HISTORY:  Past Surgical History  Procedure Laterality Date  . Right colectomy  Nov. 27, 2006  . Cardiac catheterization  2005  . Tmj arthroplasty    . Laparoscopic nissen fundoplication  0000000  . Repair prolapsedbladder    . Abdominal hysterectomy  1976   PROBLEM LIST: 1. 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 The patient underwent laparoscopic assisted right colectomy by [name] on 03/07/2005. The primary tumor measured 5.5 cm. The cancer invaded through the muscularis propria into the underlying pericolonic fibroadipose tissue. Lymphovascular invasion was identified. Surgical margins were negative. All 10 lymph nodes were negative. Tumor stage was T3 N0 IIA. The patient received adjuvant chemotherapy by [name] consisting of 4 cycles of FOLFOX and 8 cycles of chemotherapy with 5 fluorouracil leucovorin and 5FU by continuous infusion from 04/27/2005 through 10/13/2005. The patient remains without evidence of disease.  2. Meningioma. The patient apparently sees [name] and is supposed to be having yearly imaging studies. She did have an MRI of the brain with and without IV contrast on 09/23/2010. 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 This was only a mm larger than seen previously on the MRI of 04/09/2009.  3. Hypertension.  4. Diabetes mellitus.  5. GERD.  6. Prolapsed bladder.  REVIEW OF SYSTEMS:   Constitutional: Denies fevers, chills or abnormal weight loss Eyes: Denies blurriness of vision Ears, nose, mouth, throat, and face: Denies mucositis or sore throat Respiratory: Denies cough, dyspnea or wheezes Cardiovascular: Denies palpitation,  chest discomfort  or lower extremity swelling Gastrointestinal:  Denies nausea, heartburn or change in bowel habits Skin: Denies abnormal skin rashes Lymphatics: Denies new lymphadenopathy or easy bruising Neurological:Denies numbness, tingling or new weaknesses Behavioral/Psych: Mood is stable, no new changes  All other systems were reviewed with the patient and are negative.  PHYSICAL EXAMINATION: ECOG PERFORMANCE STATUS: 0 - Asymptomatic  Blood pressure 152/72, pulse 54, temperature 98.9 F (37.2 C), temperature source Oral, resp. rate 20, height 5\' 4"  (1.626 m), weight 151 lb 14.4 oz (68.901 kg), SpO2 100.00%.  GENERAL:alert, no distress and comfortable; elderly female SKIN: skin color, texture, turgor are normal, no rashes or significant lesions EYES: normal, Conjunctiva are pink and non-injected, sclera clear OROPHARYNX:no exudate, no erythema and lips, buccal mucosa, and tongue normal  NECK: supple, thyroid normal size, non-tender, without nodularity LYMPH:  no palpable lymphadenopathy in the cervical, axillary or supraclavicular LUNGS: clear to auscultation  with normal breathing effort HEART: regular rate & rhythm and no murmurs and no lower extremity edema ABDOMEN:abdomen soft, non-tender and normal bowel sounds Musculoskeletal:no cyanosis of digits and no clubbing  NEURO: alert & oriented x 3 with fluent speech, no focal motor/sensory deficits  Labs:  Lab Results  Component Value Date   WBC 8.0 05/17/2013   HGB 11.0* 05/17/2013   HCT 32.6* 05/17/2013   MCV 86.4 05/17/2013   PLT 209 05/17/2013   NEUTROABS 4.3 05/17/2013      Chemistry      Component Value Date/Time   NA 143 05/17/2013 1341   NA 140 11/21/2012 1707   K 3.6 05/17/2013 1341   K 3.5 11/21/2012 1707   CL 103 11/21/2012 1707   CL 101 05/11/2012 1544   CO2 27 05/17/2013 1341   CO2 25 11/21/2012 1707   BUN 27.0* 05/17/2013 1341   BUN 22 11/21/2012 1707   CREATININE 1.4* 05/17/2013 1341   CREATININE 1.17* 11/21/2012 1707   CREATININE 0.92  05/04/2011 1356      Component Value Date/Time   CALCIUM 10.2 05/17/2013 1341   CALCIUM 10.0 11/21/2012 1707   ALKPHOS 67 05/17/2013 1341   ALKPHOS 58 10/17/2012 1601   AST 13 05/17/2013 1341   AST 13 10/17/2012 1601   ALT 9 05/17/2013 1341   ALT 8 10/17/2012 1601   BILITOT 0.82 05/17/2013 1341   BILITOT 0.4 10/17/2012 1601     Basic Metabolic Panel:  Recent Labs Lab 05/17/13 1341  NA 143  K 3.6  CO2 27  GLUCOSE 139  BUN 27.0*  CREATININE 1.4*  CALCIUM 10.2   GFR Estimated Creatinine Clearance: 37.7 ml/min (by C-G formula based on Cr of 1.4). Liver Function Tests:  Recent Labs Lab 05/17/13 1341  AST 13  ALT 9  ALKPHOS 67  BILITOT 0.82  PROT 7.4  ALBUMIN 3.8   CBC:  Recent Labs Lab 05/17/13 1341  WBC 8.0  NEUTROABS 4.3  HGB 11.0*  HCT 32.6*  MCV 86.4  PLT 209   Studies:  No results found.   RADIOGRAPHIC STUDIES: 1. Digital screening mammogram on 05/09/2011 was negative.  2. Chest x-ray, 2 view, on 05/11/2012 was negative.   ASSESSMENT: [name] 68 y.o. female with a history of History of colon cancer - Plan: CBC with Differential, Comprehensive metabolic panel (Cmet) - [facility], [facility], [facility] Digital Screening   PLAN:   1.  Colon Cancer. --Ms. Koone continues to do well, now about 8-1/2  years from the diagnosis of her colon cancer without evidence of  recurrence. The patient is up to date, having had her colonoscopy on 06/14/2011.   2. Meningoma. --I again  urged her to call Dr. Donnella Bi office for followup  and probably another MRI of the brain, given her history of meningioma.   3. Follow up.  --The patient has requested followup in 1 year. We will plan to check CBC and chemistries at that time.   All questions were answered. The patient knows to call the clinic with any problems, questions or concerns. We can certainly see the patient much sooner if necessary.  I spent 10 minutes counseling the patient face to face. The total time spent in the  appointment was 15 minutes.    Jesenya Bowditch, MD 05/19/2013 4:04 PM

## 2013-05-27 ENCOUNTER — Encounter: Payer: Self-pay | Admitting: Family Medicine

## 2013-05-27 ENCOUNTER — Ambulatory Visit (INDEPENDENT_AMBULATORY_CARE_PROVIDER_SITE_OTHER): Payer: Medicare Other | Admitting: Family Medicine

## 2013-05-27 VITALS — BP 128/81 | HR 52 | Temp 98.1°F | Ht 64.0 in | Wt 152.0 lb

## 2013-05-27 DIAGNOSIS — M25511 Pain in right shoulder: Secondary | ICD-10-CM

## 2013-05-27 DIAGNOSIS — E1165 Type 2 diabetes mellitus with hyperglycemia: Secondary | ICD-10-CM

## 2013-05-27 DIAGNOSIS — I10 Essential (primary) hypertension: Secondary | ICD-10-CM

## 2013-05-27 DIAGNOSIS — IMO0002 Reserved for concepts with insufficient information to code with codable children: Secondary | ICD-10-CM

## 2013-05-27 DIAGNOSIS — IMO0001 Reserved for inherently not codable concepts without codable children: Secondary | ICD-10-CM

## 2013-05-27 DIAGNOSIS — E1149 Type 2 diabetes mellitus with other diabetic neurological complication: Secondary | ICD-10-CM

## 2013-05-27 DIAGNOSIS — E119 Type 2 diabetes mellitus without complications: Secondary | ICD-10-CM

## 2013-05-27 DIAGNOSIS — M25519 Pain in unspecified shoulder: Secondary | ICD-10-CM

## 2013-05-27 LAB — LIPID PANEL
Cholesterol: 144 mg/dL (ref 0–200)
HDL: 36 mg/dL — ABNORMAL LOW (ref >39)
LDL Cholesterol: 66 mg/dL (ref 0–99)
Total CHOL/HDL Ratio: 4 Ratio
Triglycerides: 212 mg/dL — ABNORMAL HIGH (ref <150)
VLDL: 42 mg/dL — ABNORMAL HIGH (ref 0–40)

## 2013-05-27 LAB — BASIC METABOLIC PANEL
BUN: 33 mg/dL — ABNORMAL HIGH (ref 6–23)
CO2: 28 mEq/L (ref 19–32)
Calcium: 9.4 mg/dL (ref 8.4–10.5)
Chloride: 103 mEq/L (ref 96–112)
Creat: 1.39 mg/dL — ABNORMAL HIGH (ref 0.50–1.10)
Glucose, Bld: 178 mg/dL — ABNORMAL HIGH (ref 70–99)
Potassium: 3.6 mEq/L (ref 3.5–5.3)
Sodium: 139 mEq/L (ref 135–145)

## 2013-05-27 LAB — POCT GLYCOSYLATED HEMOGLOBIN (HGB A1C): Hemoglobin A1C: 7.9

## 2013-05-27 MED ORDER — TRAMADOL HCL 50 MG PO TABS: 50.0000 mg | ORAL_TABLET | Freq: Two times a day (BID) | ORAL | Status: DC | PRN

## 2013-05-27 MED ORDER — DIAZEPAM 5 MG PO TABS: 5.0000 mg | ORAL_TABLET | Freq: Three times a day (TID) | ORAL | Status: DC | PRN

## 2013-05-27 MED ORDER — DESLORATADINE 5 MG PO TABS: 5.0000 mg | ORAL_TABLET | Freq: Every day | ORAL | Status: DC | PRN

## 2013-05-27 MED ORDER — QUINAPRIL-HYDROCHLOROTHIAZIDE 20-12.5 MG PO TABS: 2.0000 | ORAL_TABLET | Freq: Every day | ORAL | Status: DC

## 2013-05-27 MED ORDER — NITROGLYCERIN 0.4 MG SL SUBL: 0.4000 mg | SUBLINGUAL_TABLET | SUBLINGUAL | Status: DC | PRN

## 2013-05-27 MED ORDER — GLIPIZIDE 10 MG PO TABS: 10.0000 mg | ORAL_TABLET | Freq: Two times a day (BID) | ORAL | Status: DC

## 2013-05-27 NOTE — Patient Instructions (Signed)
For the diabetes, go back to lantus 10 units every day. Check your sugars twice a day and make an appointment with the pharmacy clinic, Dr. Valentina Lucks in 2-3 weeks to discuss your sugars.   For the shoulder pain, do the exercises I showed you and take the tramadol. Avoid ibuprofen for now.   I will send you a letter with the lab results.

## 2013-05-27 NOTE — Progress Notes (Signed)
Patient ID: [name]    DOB: [birth year], 68 y.o.   MRN: [id number] --- Subjective:  [name] is a 68 y.o.female who presents for follow up on DM2. She also reports some right sided shoulder pain.   # Diabetes: - medications: glipizide 10mg  bid and lantus 16 units qd - missed doses in 1 week: patient takes lantus every other day if her sugars are in the 100's/  - hypoglycemic events: none - CBG's at home: 105 to 200's/  - frequency of monitoring: once a day - changes in vision?no - numbness or tingling in lower extremities? no - sores or ulcers?no - seen by podiatry?no - diet: has cut out coolaid, sodas, juice. Eats vegetables and baked meats.  - exercise: once in a while. Just got a membership to [employer].   # Right shoulder pain:  Ongoing for several months. Worst with over head movement. Difficult to cook for long periods of times. Pain is located on top and front of shoulder. No associated hand weakness. No neck pain. Has been taking 3 ibuprofen every day which helps.   # Hypertension: Medications: coreg 12.5mg  bid, quinapril/hctz 20/12.5 Number of doses missed in 1 week:none BP at home: no Exercise routine: see above Chest pain: none Lower extremity swelling: none Shortness of breath: none Headache: none Change in vision: none  ROS: see HPI Past Medical History: reviewed and updated medications and allergies. Social History: Tobacco: none  Objective: Filed Vitals:   05/27/13 0938  BP: 128/81  Pulse: 52  Temp: 98.1 F (36.7 C)    Physical Examination:   General appearance - alert, well appearing, and in no distress Mouth - mucous membranes moist, pharynx normal without lesions Neck - supple, no significant adenopathy Chest - clear to auscultation, no wheezes, rales or rhonchi, symmetric air entry Heart - normal rate, regular rhythm, normal S1, S2, no murmurs Extremities - peripheral pulses normal, no pedal edema, no sores or ulcerations, normal microfilament  testing bilaterally Right shoulder: decreased abduction motion to 110 degrees, normal internal and external rotation, negative Hawkin's, normal empty can, normal lift off.  No soft tissue swelling or joint effusion Tenderness to palpation along AC joint and top of shoulder

## 2013-05-28 NOTE — Assessment & Plan Note (Signed)
Ongoing problem for over a year.  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 Start tramadol - consider xray if doesn't improve

## 2013-05-28 NOTE — Assessment & Plan Note (Signed)
Well controled  Continue quinapril/hctz and coreg cmp today Encouraged 3 times a week exercise.

## 2013-05-28 NOTE — Assessment & Plan Note (Addendum)
A1C going up from last time. Now 7.9. Suspect this is from erratic lantus use. Patient is likely not on metformin due to renal insufficiency. She is on glipizide which could induce hypoglycemia but she denies any hypoglycemic episodes.  - continue glipizide - start lantus 10units once a day instead of 16 units since patient is hesitant to take this much on a daily basis.  - instructed patient to check CBG's and make follow up appointment with pharmacy clinic to review CBG's and adjust insulin if needed - check lipid panel today. Patient not on a statin. Will calculate risk and whether statin is warranted.

## 2013-05-29 ENCOUNTER — Telehealth: Payer: Self-pay | Admitting: Family Medicine

## 2013-05-29 ENCOUNTER — Encounter: Payer: Self-pay | Admitting: Family Medicine

## 2013-05-29 DIAGNOSIS — L209 Atopic dermatitis, unspecified: Secondary | ICD-10-CM

## 2013-05-29 DIAGNOSIS — E785 Hyperlipidemia, unspecified: Secondary | ICD-10-CM

## 2013-05-29 MED ORDER — ATORVASTATIN CALCIUM 80 MG PO TABS: 80.0000 mg | ORAL_TABLET | Freq: Every day | ORAL | Status: DC

## 2013-05-29 MED ORDER — TRIAMCINOLONE 0.1 % CREAM:EUCERIN CREAM 1:1: TOPICAL_CREAM | CUTANEOUS | Status: DC

## 2013-05-29 NOTE — Telephone Encounter (Signed)
Called patient to let her know of her lab results:  - creatinine of 1.4: avoid NSAIDs. Repeat BMP in 3 months. Consider urine check at next visit.  - hyperlipidemia: 10 year ASCVD risk of 17.4%. High intensity statin recommended. Will start lipitor 80mg  daily. Warned her of myalgias as possible side effect. Discussed this with patient and she agrees.  - patient reports itching of her arms and back due to dry skin and eczema. Will send Rx for eucerin and triamcinolone compound cream.   [name], PGY-3 Family Medicine Resident

## 2013-05-30 ENCOUNTER — Telehealth: Payer: Self-pay | Admitting: *Deleted

## 2013-05-30 NOTE — Telephone Encounter (Signed)
PA form received from [facility] for Desloratadine 5 mg tab.  Form placed in PCP box for review.  Derl Barrow, RN

## 2013-05-31 ENCOUNTER — Ambulatory Visit
Admission: RE | Admit: 2013-05-31 | Discharge: 2013-05-31 | Disposition: A | Discharge status: Home / Self Care | Payer: Medicare Other | Source: Ambulatory Visit | Attending: Internal Medicine | Admitting: Internal Medicine

## 2013-05-31 DIAGNOSIS — Z85038 Personal history of other malignant neoplasm of large intestine: Secondary | ICD-10-CM

## 2013-06-03 MED ORDER — TRIAMCINOLONE ACETONIDE 0.1 % EX OINT: 1.0000 "application " | TOPICAL_OINTMENT | Freq: Two times a day (BID) | CUTANEOUS | Status: DC

## 2013-06-03 MED ORDER — CETIRIZINE HCL 10 MG PO TABS
10.0000 mg | ORAL_TABLET | Freq: Every day | ORAL | Status: DC
Start: 2013-06-03 — End: 2013-12-06

## 2013-06-03 NOTE — Telephone Encounter (Signed)
Called patient to see if she had ever tried another allergy medicine prior to desloratadine. She tells me that she has not tried anything prior. She also tells me that the eczema cream was not approved at the pharmacy.  Will send zyrtec and will also send triamcinolone ointment.   [name], PGY-3 Family Medicine Resident

## 2013-06-17 ENCOUNTER — Ambulatory Visit: Payer: Medicare Other | Admitting: Pharmacist

## 2013-07-18 ENCOUNTER — Encounter: Payer: Self-pay | Admitting: Family Medicine

## 2013-07-18 ENCOUNTER — Ambulatory Visit (INDEPENDENT_AMBULATORY_CARE_PROVIDER_SITE_OTHER): Payer: Medicare Other | Admitting: Family Medicine

## 2013-07-18 VITALS — BP 130/52 | HR 61 | Temp 98.3°F | Ht 64.0 in | Wt 148.1 lb

## 2013-07-18 DIAGNOSIS — M25519 Pain in unspecified shoulder: Secondary | ICD-10-CM

## 2013-07-18 DIAGNOSIS — R112 Nausea with vomiting, unspecified: Secondary | ICD-10-CM

## 2013-07-18 DIAGNOSIS — M25511 Pain in right shoulder: Secondary | ICD-10-CM

## 2013-07-18 LAB — CBC WITH DIFFERENTIAL/PLATELET
Basophils Absolute: 0 10*3/uL (ref 0.0–0.1)
Basophils Relative: 0 % (ref 0–1)
Eosinophils Absolute: 0.1 10*3/uL (ref 0.0–0.7)
Eosinophils Relative: 1 % (ref 0–5)
HCT: 30.9 % — ABNORMAL LOW (ref 36.0–46.0)
Hemoglobin: 10.4 g/dL — ABNORMAL LOW (ref 12.0–15.0)
Lymphocytes Relative: 33 % (ref 12–46)
Lymphs Abs: 1.8 10*3/uL (ref 0.7–4.0)
MCH: 28.1 pg (ref 26.0–34.0)
MCHC: 33.7 g/dL (ref 30.0–36.0)
MCV: 83.5 fL (ref 78.0–100.0)
Monocytes Absolute: 0.5 10*3/uL (ref 0.1–1.0)
Monocytes Relative: 9 % (ref 3–12)
Neutro Abs: 3.1 10*3/uL (ref 1.7–7.7)
Neutrophils Relative %: 57 % (ref 43–77)
Platelets: 231 10*3/uL (ref 150–400)
RBC: 3.7 MIL/uL — ABNORMAL LOW (ref 3.87–5.11)
RDW: 13.5 % (ref 11.5–15.5)
WBC: 5.4 10*3/uL (ref 4.0–10.5)

## 2013-07-18 LAB — BASIC METABOLIC PANEL
BUN: 27 mg/dL — ABNORMAL HIGH (ref 6–23)
CO2: 25 mEq/L (ref 19–32)
Calcium: 9 mg/dL (ref 8.4–10.5)
Chloride: 101 mEq/L (ref 96–112)
Creat: 1.52 mg/dL — ABNORMAL HIGH (ref 0.50–1.10)
Glucose, Bld: 267 mg/dL — ABNORMAL HIGH (ref 70–99)
Potassium: 3.5 mEq/L (ref 3.5–5.3)
Sodium: 136 mEq/L (ref 135–145)

## 2013-07-18 LAB — LIPASE: Lipase: 23 U/L (ref 0–75)

## 2013-07-18 MED ORDER — ONDANSETRON 8 MG PO TBDP: 8.0000 mg | ORAL_TABLET | Freq: Three times a day (TID) | ORAL | Status: DC | PRN

## 2013-07-18 MED ORDER — TRAMADOL HCL 50 MG PO TABS: 50.0000 mg | ORAL_TABLET | Freq: Two times a day (BID) | ORAL | Status: DC | PRN

## 2013-07-18 NOTE — Patient Instructions (Addendum)
I am sorry you feel unwell. I think you fell because you were dehydrated. I think you have a viral stomach bug making you feel sick. Take zofran three times a day as needed for nausea, so that you can drink plenty of fluids. We are checking labs today and I will call you if anything is NOT normal. Take tramadol as needed for your shoulder pain. It appears bruised but you have good range of motion. If it does not get better in 5-7 days, come back for re-evaluation. If your stomach symptoms also do not improve in 5-7 days or worsen, return for re-evaluation.  Best,  Hilton Sinclair, MD

## 2013-07-18 NOTE — Progress Notes (Signed)
Patient ID: [name], female   DOB: Aug 16, 1945, 68 y.o.   MRN: [id number] Subjective:   CC: Stomach symptoms and fall  HPI:   Stomach symptoms - Patient felt "sick to her stomach" 4 days ago and was eating very little. She started vomiting 2 days ago and is having "clear green" diarrhea different from her usual diarrhea (with h/o partial colectomy for colon cancer). She feels nauseated even with drinking water. Reports general malaise and abdominal pain. Denies fevers, bloody stool or emesis, new medications, or new foods. Has not tried any medication. Reports sharp chest pain when she vomits that improves with laying back. Quick movement / position change or emesis brings on pain. Denies leg swelling.   Fall 2 days ago - Patient now has right shoulder "bad" pain and neck pain when she looks down, head feeling "heavy." She states she fell because she was lightheaded and legs gave out. Does not report syncope. She does not have any redness. No numbness/tingling. Does not report hitting head.   Review of Systems - Per HPI.  PMH: Colon cancer hx; H/o partial colectomy 2006, doing okay since then.    Objective:  Physical Exam BP 125/72  Pulse 60  Temp(Src) 98.3 F (36.8 C) (Oral)  Ht 5\' 4"  (1.626 m)  Wt 148 lb 1.6 oz (67.178 kg)  BMI 25.41 kg/m2 GEN: NAD HEENT: Atraumatic, normocephalic, neck supple, EOMI, sclera clear  CV: RRR, no murmurs, rubs, or gallops. No JVD. PULM: CTAB, normal effort ABD: Soft, mild generalized nonspecific tenderness, nondistended SKIN: No rash or cyanosis; warm and well-perfused EXTR: No lower extremity edema or calf tenderness; right shoulder tender throughout with no point tenderness; able to squeeze fingers and normal shoulder ROM, mildly slowed movements; 4/5 strength right shoulder/arm PSYCH: Mood and affect euthymic, normal rate and volume of speech NEURO: Awake, alert, no focal deficits grossly, normal speech    Assessment:     [name] is  a 68 y.o. female here for eval after fall hitting shoulder and stomach symptoms.  Plan:     Viral gastroenteritis - Likely worsened by patient's h/o partial colectomy. Currently having trouble maintaining PO, though BP and hydration status on exam seem stable. Fall likely due to hypotension/orthostasis. No reported head trauma or neurologic change. - Reassured. - Hydrate, rest, and wash hands. - Zofran prn nausea.  - Hold antihypertensive 1-2 days until drinking better.  - Return precautions reviewed.  - check BMET and CBC>> Cr 1.52 (mildly worsened from baseline) with BUN:Cr ratio 17. Likely prerenal. Recheck in 1 week with improved hydration. - Lipase >>> normal  Shoulder pain - Contusion after fall, worsening chronic pain. Ddoes not appear dislocated or infected. Likely due to hypotension causing fall. Not orthostatic in clinic.  - tramadol PRN shoulder pain, short-course with # 15 - return for shoulder eval in 5-7 days if no improvement.  Other issues: - Incidentally found anemia (10.4 hgb, 11 2 mo ago 11.8 1 year ago) with normal MCV. H/o iron deficiency in problem list. Fwd to PCP to f/u as this is subacute but would consider h/o colon cancer and screening parameters after partial colectomy. Nl MCV makes iron deficiency/blood loss anemia less likely. Letter sent to pt. - Hyperglycemia >200 - A1c 7.9 in 05/2013, recheck in May and monitor intake of sweets/carbs. Unable to reach by phone (vm not set up) - Letter sent.  Follow-up: Follow up in 5-7 days if no improvement in shoulder pain, for re-evaluation.  Hilton Sinclair, MD [place]

## 2013-07-20 NOTE — Assessment & Plan Note (Signed)
Contusion after fall, worsening chronic pain. Does not appear dislocated or infected. Likely due to hypotension causing fall. Not orthostatic in clinic.  - tramadol PRN shoulder pain, short-course with # 15 - return for shoulder eval in 5-7 days if no improvement.

## 2013-07-20 NOTE — Assessment & Plan Note (Signed)
Viral gastroenteritis, likely worsened by patient's h/o partial colectomy. Currently having trouble maintaining PO, though BP and hydration status on exam seem stable. Fall likely due to hypotension/orthostasis. No reported head trauma or neurologic change. - Reassured. - Hydrate, rest, and wash hands. - Zofran prn nausea.  - Hold antihypertensive 1-2 days until drinking better.  - Return precautions reviewed.  - check BMET and CBC>> Cr 1.52 (mildly worsened from baseline) with BUN:Cr ratio 17. Likely prerenal. Recheck in 1 week with improved hydration. - Lipase >>> normal

## 2013-08-24 ENCOUNTER — Encounter: Payer: Self-pay | Admitting: Family Medicine

## 2013-08-24 LAB — HEMOGLOBIN A1C: Hgb A1c MFr Bld: 8.1 % — AB (ref 4.0–6.0)

## 2013-09-19 ENCOUNTER — Ambulatory Visit (INDEPENDENT_AMBULATORY_CARE_PROVIDER_SITE_OTHER): Payer: Medicare Other | Admitting: Family Medicine

## 2013-09-19 ENCOUNTER — Encounter: Payer: Self-pay | Admitting: Family Medicine

## 2013-09-19 VITALS — BP 158/69 | HR 55 | Temp 97.9°F | Ht 64.0 in | Wt 146.0 lb

## 2013-09-19 DIAGNOSIS — IMO0001 Reserved for inherently not codable concepts without codable children: Secondary | ICD-10-CM

## 2013-09-19 DIAGNOSIS — R197 Diarrhea, unspecified: Secondary | ICD-10-CM

## 2013-09-19 DIAGNOSIS — D649 Anemia, unspecified: Secondary | ICD-10-CM

## 2013-09-19 DIAGNOSIS — E1149 Type 2 diabetes mellitus with other diabetic neurological complication: Secondary | ICD-10-CM

## 2013-09-19 DIAGNOSIS — IMO0002 Reserved for concepts with insufficient information to code with codable children: Secondary | ICD-10-CM

## 2013-09-19 DIAGNOSIS — M25519 Pain in unspecified shoulder: Secondary | ICD-10-CM

## 2013-09-19 DIAGNOSIS — E1165 Type 2 diabetes mellitus with hyperglycemia: Secondary | ICD-10-CM

## 2013-09-19 DIAGNOSIS — R42 Dizziness and giddiness: Secondary | ICD-10-CM

## 2013-09-19 LAB — IRON AND TIBC
%SAT: 22 % (ref 20–55)
Iron: 71 ug/dL (ref 42–145)
TIBC: 321 ug/dL (ref 250–470)
UIBC: 250 ug/dL (ref 125–400)

## 2013-09-19 LAB — CBC WITH DIFFERENTIAL/PLATELET
Basophils Absolute: 0 10*3/uL (ref 0.0–0.1)
Basophils Relative: 0 % (ref 0–1)
Eosinophils Absolute: 0.1 10*3/uL (ref 0.0–0.7)
Eosinophils Relative: 1 % (ref 0–5)
HCT: 36.3 % (ref 36.0–46.0)
Hemoglobin: 12.1 g/dL (ref 12.0–15.0)
Lymphocytes Relative: 20 % (ref 12–46)
Lymphs Abs: 2 10*3/uL (ref 0.7–4.0)
MCH: 28 pg (ref 26.0–34.0)
MCHC: 33.3 g/dL (ref 30.0–36.0)
MCV: 84 fL (ref 78.0–100.0)
Monocytes Absolute: 0.7 10*3/uL (ref 0.1–1.0)
Monocytes Relative: 7 % (ref 3–12)
Neutro Abs: 7.1 10*3/uL (ref 1.7–7.7)
Neutrophils Relative %: 72 % (ref 43–77)
Platelets: 269 10*3/uL (ref 150–400)
RBC: 4.32 MIL/uL (ref 3.87–5.11)
RDW: 13.4 % (ref 11.5–15.5)
WBC: 9.8 10*3/uL (ref 4.0–10.5)

## 2013-09-19 LAB — BASIC METABOLIC PANEL
BUN: 22 mg/dL (ref 6–23)
CO2: 23 mEq/L (ref 19–32)
Calcium: 9.6 mg/dL (ref 8.4–10.5)
Chloride: 103 mEq/L (ref 96–112)
Creat: 1.18 mg/dL — ABNORMAL HIGH (ref 0.50–1.10)
Glucose, Bld: 212 mg/dL — ABNORMAL HIGH (ref 70–99)
Potassium: 3.9 mEq/L (ref 3.5–5.3)
Sodium: 137 mEq/L (ref 135–145)

## 2013-09-19 LAB — TSH: TSH: 0.673 u[IU]/mL (ref 0.350–4.500)

## 2013-09-19 LAB — GLUCOSE, CAPILLARY: Glucose-Capillary: 164 mg/dL — ABNORMAL HIGH (ref 70–99)

## 2013-09-19 LAB — HEMOGLOBIN A1C: A1c: 8.1

## 2013-09-19 LAB — FERRITIN: Ferritin: 263 ng/mL (ref 10–291)

## 2013-09-19 MED ORDER — TRAMADOL HCL 50 MG PO TABS: 50.0000 mg | ORAL_TABLET | Freq: Two times a day (BID) | ORAL | Status: DC | PRN

## 2013-09-19 MED ORDER — ONDANSETRON HCL 4 MG PO TABS: 4.0000 mg | ORAL_TABLET | Freq: Three times a day (TID) | ORAL | Status: DC | PRN

## 2013-09-19 NOTE — Progress Notes (Signed)
Patient ID: [name]    DOB: [birth year], 68 y.o.   MRN: [id number] --- Subjective:  [name] is a 68 y.o.female with h/o DM2, HTN, colon cancer in 2006 with right colectomy who presents with nausea, diarrhea and vertigo and follow up on DM2.  # vertigo: patient has had a history of vertigo in the past. She states that in the last 4 weeks, she has been having daily spells of sensation of spinning. Lasts a few minutes, resolves spontaneously. Spinning is induced with head movement or laying her head down. No associated headache or nausea or vomiting or change in vision or tinnitus or hearing loss. She takes valium which does help.  # diarrhea with nausea: started on morning of office visit. Had one episode of loose green, non bloody stool. No vomiting. Since her colectomy, she has occurrence of this every couple of months. No fevers. No chills. Has not had anything to eat or drink today. No abdominal pain.   # Diabetes: - medications: lantus 10 units. Takes it every other day for fear of low sugar. Glipizide 10mg  daily.  - missed doses in 1 week: see above - hypoglycemic events: none, lowest CBG: 130 - CBG's at home: 140-150 - frequency of monitoring: 1-2 times per day - changes in vision? Stable: sees Dr. Bing Plume for glaucoma - numbness or tingling in lower extremities?none - sores or ulcers?none - seen by podiatry?no - last eye check? Recently seen by Dr. Bing Plume - exercise: joined [employer] in April and goes there 3 times per week  ROS: see HPI Past Medical History: reviewed and updated medications and allergies. Social History: Tobacco: none  Objective: Filed Vitals:   09/19/13 0854  BP: 158/69  Pulse: 55  Temp: 97.9 F (36.6 C)    Physical Examination:   General appearance - alert, well appearing, and in no distress Mouth - mucous membranes moist, pharynx normal without lesions Neck - supple, no significant adenopathy Chest - clear to auscultation, no wheezes, rales or  rhonchi, symmetric air entry Heart - normal rate, regular rhythm, normal S1, S2, no murmurs Abdomen - soft, nontender, nondistended, no masses or organomegaly Extremities - no pedal edema, no sores or ulcers, normal DP bilaterally Neuro - CN2-12 grossly intact, normal strength in upper extremities bilaterally Dix Hallpike positive for reproducible vertigo both on left and right. Nystagmus not appreciated  CBG: fasting: 164

## 2013-09-19 NOTE — Patient Instructions (Signed)
For the diabetes, take the glipizide once a day and take the lantus every day. If your sugar is less than 70, do not take the insulin. Follow up in 3 months.   We are going to check some labwork to make sure everything is stable.   For the vertigo, I am referring you to vestibular rehab.   For the diarrhea, keep hydrated, but if you do not get any better, please return.   I am sending zofran for nausea.

## 2013-09-23 ENCOUNTER — Telehealth: Payer: Self-pay | Admitting: Family Medicine

## 2013-09-23 NOTE — Telephone Encounter (Signed)
Please let patient know that her labwork was overall normal showing normal electrolytes, normal thyroid function. Her kidney functon is better than it was. Labwork is reassuring.   Thank you!  [name], PGY-3 Family Medicine Resident

## 2013-09-23 NOTE — Assessment & Plan Note (Signed)
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 No focal deficits on neuro exam.  - will refer patient for vestibular rehab

## 2013-09-23 NOTE — Telephone Encounter (Signed)
Called patient and informed her of normal labs.Dineen Conradt, Kevin Fenton

## 2013-09-23 NOTE — Assessment & Plan Note (Signed)
1 day of diarrhea. Patient not appearing dehydrated at this time. This seems to be recurrent occurrence. Unclear if it is related to her colectomy.  She had colonoscopy on 06/14/2011. She will be due back for repeat in 2016.  If patient persistently has diarrhea and this episode doesn't resolve, consider referring her back to GI for evaluation.  For now, will check electrolytes and TSH (in setting of diarrhea) Encouraged hydration Return if not better, worst or not keeping fluids down  zofran sent to pharmacy

## 2013-09-23 NOTE — Assessment & Plan Note (Signed)
A1C of 8.1 on 09/08/13 by insurance lab Patient is still hesitant to take lantus more than every other day. I suggested that we stop the glipizide and take lantus every day instead, but patient prefers to take oral medication than taking insulin every day.  - encouraged her to keep exercising with silver sneakers and going to the [facility] on regular basis.  - will not change regimen for now - follow up in 3 months for repeat A1C - continue lipitor - patient being followed by ophthalmology

## 2013-09-23 NOTE — Telephone Encounter (Signed)
Please let patient know.

## 2013-10-02 ENCOUNTER — Ambulatory Visit (INDEPENDENT_AMBULATORY_CARE_PROVIDER_SITE_OTHER): Payer: Medicare Other | Admitting: Ophthalmology

## 2013-11-19 ENCOUNTER — Ambulatory Visit (INDEPENDENT_AMBULATORY_CARE_PROVIDER_SITE_OTHER): Payer: Medicare Other | Admitting: Family Medicine

## 2013-11-19 ENCOUNTER — Encounter: Payer: Self-pay | Admitting: Family Medicine

## 2013-11-19 VITALS — BP 183/69 | HR 56 | Temp 98.0°F | Wt 146.0 lb

## 2013-11-19 DIAGNOSIS — R634 Abnormal weight loss: Secondary | ICD-10-CM | POA: Insufficient documentation

## 2013-11-19 DIAGNOSIS — Z85038 Personal history of other malignant neoplasm of large intestine: Secondary | ICD-10-CM

## 2013-11-19 DIAGNOSIS — E785 Hyperlipidemia, unspecified: Secondary | ICD-10-CM

## 2013-11-19 DIAGNOSIS — D320 Benign neoplasm of cerebral meninges: Secondary | ICD-10-CM

## 2013-11-19 DIAGNOSIS — E1165 Type 2 diabetes mellitus with hyperglycemia: Secondary | ICD-10-CM

## 2013-11-19 DIAGNOSIS — IMO0001 Reserved for inherently not codable concepts without codable children: Secondary | ICD-10-CM

## 2013-11-19 DIAGNOSIS — K219 Gastro-esophageal reflux disease without esophagitis: Secondary | ICD-10-CM

## 2013-11-19 DIAGNOSIS — IMO0002 Reserved for concepts with insufficient information to code with codable children: Secondary | ICD-10-CM

## 2013-11-19 DIAGNOSIS — I10 Essential (primary) hypertension: Secondary | ICD-10-CM

## 2013-11-19 LAB — POCT GLYCOSYLATED HEMOGLOBIN (HGB A1C): Hemoglobin A1C: 8.2

## 2013-11-19 MED ORDER — ATORVASTATIN CALCIUM 80 MG PO TABS: 80.0000 mg | ORAL_TABLET | Freq: Every day | ORAL | Status: DC

## 2013-11-19 MED ORDER — ESOMEPRAZOLE MAGNESIUM 40 MG PO PACK: 40.0000 mg | PACK | Freq: Every day | ORAL | Status: DC

## 2013-11-19 MED ORDER — GLIPIZIDE 10 MG PO TABS: 10.0000 mg | ORAL_TABLET | Freq: Every day | ORAL | Status: DC

## 2013-11-19 MED ORDER — QUINAPRIL-HYDROCHLOROTHIAZIDE 20-12.5 MG PO TABS: 2.0000 | ORAL_TABLET | Freq: Every day | ORAL | Status: DC

## 2013-11-19 MED ORDER — ONDANSETRON 8 MG PO TBDP: 8.0000 mg | ORAL_TABLET | Freq: Three times a day (TID) | ORAL | Status: DC | PRN

## 2013-11-19 MED ORDER — METOCLOPRAMIDE HCL 5 MG PO TABS
5.0000 mg | ORAL_TABLET | Freq: Three times a day (TID) | ORAL | Status: DC
Start: 2013-11-19 — End: 2014-07-16

## 2013-11-19 MED ORDER — CARVEDILOL 12.5 MG PO TABS: 12.5000 mg | ORAL_TABLET | Freq: Two times a day (BID) | ORAL | Status: DC

## 2013-11-19 NOTE — Patient Instructions (Signed)
Thank you for coming in, today!  I will refill your medications, today. I will also give you Zofran (ondansetron) to help with your nausea, and start a new medicine that might help, called Reglan (metoclopromide).  We will get you set up for an appointment with Dr. Juliann Mule (at oncology) soon. He will help evaluate whether or not you need more imaging or testing to see if this is related to your prior cancer.  Come back to see me after you see the oncologist, or as you need. If you are not feeling any better at all in 1-2 weeks, give me a call. If your symptoms are getting worse instead of better, if you have persistent chest pain, shortness of breath, or vomiting / nausea that your medicines don't help, call the clinic or go to the emergency room. Please feel free to call with any questions or concerns at any time, at [phone]. --Dr. Venetia Maxon

## 2013-11-19 NOTE — Assessment & Plan Note (Signed)
A: Major concern voiced by pt this visit, as well as primary concern from my standpoint; 10 lb loss in 2 months, with otherwise a constellation of symptoms specific in type (vomiting, "blow-out" bowel movements, poor PO intake) but vague in description (severity, duration, association, etc). Hx of colon cancer s/p chemo and surgery as well as hx of meningioma last imaged about 1 year ago. Clear concern for possible recurrence of malignancy, though also possible component of DM (?gastroparesis). No red flags on exam, no abdominal masses palpated, and pt does not appear frankly cachetic or emaciated.   P: Checking basic labs today, CBC, CMP. Also re-referred to oncology in hopes of having pt seen relatively soon for input from them on whether repeat imaging or other bloodwork is warranted. Also gave Rx for Zofran and Reglan (see DM problem list note) for symptomatic control, in case conservative therapy is helpful, alone. Strongly encouraged pt follow up as closely with onc as possible, and to return to clinic in the next couple of weeks if symptoms are worse or not improved with medications. Will f/u labs as appropriate.

## 2013-11-19 NOTE — Assessment & Plan Note (Signed)
A: A1c 8.2, has generally been 7-8 in the past year or two. Currently taking Lantus and glipizide at reduced doses due to concern that current complaints of nausea, poor PO intake, weight loss, etc are caused by low blood sugar. However, symptoms sound possibly more like gastroparesis, with hypoglycemia possible if she is truly taking that much less PO than previously.  P: Continue Lantus and glipizide at current self-reduced doses. Starting Reglan TID with meals, today (counseled on possible side effect of drowsiness). Also Rx for Zofran. Will check CMP as well for kidney function. Otherwise continue Lipitor, ACE, ASA and see separate problem list notes. Will plan to f/u for diabetes specifically in 3 months.

## 2013-11-19 NOTE — Assessment & Plan Note (Signed)
Markedly elevated, today, but not specifically symptomatic. Refilled quinapril-HCTZ and Coreg and checking kidney function today. Possibly related to current symptoms (see HPI, other problem list notes). Will follow at regular office visits; strongly advised presentation to the ED if she has any neuro changes, chest pain, SOB, or significantly increased LE swelling.

## 2013-11-19 NOTE — Assessment & Plan Note (Signed)
Of major concern given pt's recent weight loss; see separate problem list notes. Re-referred for oncology input, today.

## 2013-11-19 NOTE — Progress Notes (Signed)
   Subjective:    Patient ID: [name], female    DOB: 03-May-1945, 68 y.o.   MRN: [id number]  HPI: Pt presents to clinic with complaint of weight loss, nausea, intermittent abdominal pain, present for about 2 months.  Weight loss - pt reports about 10 lb weight loss since June (about 2 months), unintentionally - she reports nausea with occasional vomiting, occasional pain in her stomach, and headaches related to her nausea - she also reports "blow-out" bowel movements (loss of control of her bowels) about once a week for the same time period - she has had no blood in her stool but her stools have been "dark" - note pt has had colon cancer and states she feels similar to how she felt when she found out - pt is followed at [facility] but has not been seen in several months - pt also has a history of meningeoma, last seen by Dr. Sherwood Gambler (neurosurgery) "a long time ago"  DM - reports she is taking Lantus and glipizide, but at reduced doses since the above symptoms started - pt reports taking only Lantus 10 units at night and glipizide just once per day - she states this might have helped her feel some better  HTN - reports compliance with quinapril-HCTZ - denies chest pain, SOB  GERD - reports some occasional burning pain; takes Nexium, but is out  Review of Systems: As above. Pt does report feeling "cold" all time time, but denies frank fevers,      Objective:   Physical Exam BP 183/69  Pulse 56  Temp(Src) 98 F (36.7 C) (Oral)  Wt 146 lb (66.225 kg) Gen: well-appearing adult female in NAD; elderly, very pleasant HEENT: [place]/AT, EOMI, PERRLA, MMM Cardio: RRR, no murmur appreciated Pulm: CTAB, no wheezes Abd: soft, nontender; protuberant but not frankly distended or tympanic  BS+, no focal tenderness, though mildly uncomfortable with deep palpation Ext: warm, well-perfused, no LE edema Neuro: alert, oriented, no gross focal deficit noted  Subjective complaint of  "unsteadiness" but pt sits / stands / ambulates without assistance     Assessment & Plan:  Precepted with Dr. Ree Kida; obvious concern for recurrence of cancer or complication of diabetes. See problem list notes.

## 2013-11-19 NOTE — Assessment & Plan Note (Signed)
Reasonably well-controlled with Nexium, though pt has been out of this "a while" and it may be contributing to her current constellation of symptoms. Refilled today. F/u and plan per other problem list notes and for this specific issue as needed.

## 2013-11-19 NOTE — Assessment & Plan Note (Signed)
Per history, with last imaging 10/2012 without significant interval change since last imaging before that. Possibly contributing to current symptoms (see HPI) though neuromalignancy seems less likely than recurrent colon CA-related pathology, DM, or other mix of etiologies. Neurosurgeon is Dr. Sherwood Gambler. Will plan to manage current symptoms with work-up and follow-up as documented in other problem list notes; if no improvement or answers from current plans, will repeat MRI (likely needs yearly MRI, anyway), and re-refer to neurosurgery.

## 2013-11-20 LAB — CBC WITH DIFFERENTIAL/PLATELET
Basophils Absolute: 0 10*3/uL (ref 0.0–0.1)
Basophils Relative: 0 % (ref 0–1)
Eosinophils Absolute: 0.1 10*3/uL (ref 0.0–0.7)
Eosinophils Relative: 1 % (ref 0–5)
HCT: 31.7 % — ABNORMAL LOW (ref 36.0–46.0)
Hemoglobin: 10.8 g/dL — ABNORMAL LOW (ref 12.0–15.0)
Lymphocytes Relative: 36 % (ref 12–46)
Lymphs Abs: 3.1 10*3/uL (ref 0.7–4.0)
MCH: 28.2 pg (ref 26.0–34.0)
MCHC: 34.1 g/dL (ref 30.0–36.0)
MCV: 82.8 fL (ref 78.0–100.0)
Monocytes Absolute: 0.6 10*3/uL (ref 0.1–1.0)
Monocytes Relative: 7 % (ref 3–12)
Neutro Abs: 4.8 10*3/uL (ref 1.7–7.7)
Neutrophils Relative %: 56 % (ref 43–77)
Platelets: 238 10*3/uL (ref 150–400)
RBC: 3.83 MIL/uL — ABNORMAL LOW (ref 3.87–5.11)
RDW: 13.6 % (ref 11.5–15.5)
WBC: 8.6 10*3/uL (ref 4.0–10.5)

## 2013-11-20 LAB — COMPREHENSIVE METABOLIC PANEL
ALT: 16 U/L (ref 0–35)
AST: 11 U/L (ref 0–37)
Albumin: 4 g/dL (ref 3.5–5.2)
Alkaline Phosphatase: 64 U/L (ref 39–117)
BUN: 22 mg/dL (ref 6–23)
CO2: 27 mEq/L (ref 19–32)
Calcium: 9.9 mg/dL (ref 8.4–10.5)
Chloride: 101 mEq/L (ref 96–112)
Creat: 1.16 mg/dL — ABNORMAL HIGH (ref 0.50–1.10)
Glucose, Bld: 200 mg/dL — ABNORMAL HIGH (ref 70–99)
Potassium: 3.9 mEq/L (ref 3.5–5.3)
Sodium: 138 mEq/L (ref 135–145)
Total Bilirubin: 0.5 mg/dL (ref 0.2–1.2)
Total Protein: 7.1 g/dL (ref 6.0–8.3)

## 2013-11-21 ENCOUNTER — Encounter: Payer: Self-pay | Admitting: Family Medicine

## 2013-11-22 ENCOUNTER — Other Ambulatory Visit: Payer: Self-pay

## 2013-11-22 ENCOUNTER — Telehealth: Payer: Self-pay | Admitting: Internal Medicine

## 2013-11-22 NOTE — Telephone Encounter (Signed)
Pt's Dr. Fransisco Beau a sch for pt asap w/Dr. Juliann Mule, r/s pt for labs/ov per 08/14 POF, mailed updated sch to pt...KJ

## 2013-11-27 ENCOUNTER — Ambulatory Visit (HOSPITAL_BASED_OUTPATIENT_CLINIC_OR_DEPARTMENT_OTHER): Payer: Medicare Other | Admitting: Internal Medicine

## 2013-11-27 ENCOUNTER — Telehealth: Payer: Self-pay | Admitting: Internal Medicine

## 2013-11-27 ENCOUNTER — Other Ambulatory Visit (HOSPITAL_BASED_OUTPATIENT_CLINIC_OR_DEPARTMENT_OTHER): Payer: Medicare Other

## 2013-11-27 VITALS — BP 158/62 | HR 55 | Temp 98.0°F | Resp 17 | Ht 64.0 in | Wt 145.6 lb

## 2013-11-27 DIAGNOSIS — R634 Abnormal weight loss: Secondary | ICD-10-CM

## 2013-11-27 DIAGNOSIS — Z85038 Personal history of other malignant neoplasm of large intestine: Secondary | ICD-10-CM

## 2013-11-27 NOTE — Progress Notes (Signed)
[place], [place] [address]  DIAGNOSIS: History of colon cancer - Plan: CT Abdomen Wo Contrast, CBC with Differential, Ferritin, Iron and TIBC [facility]  Chief Complaint  Patient presents with  . Follow-up   CURRENT THERAPY: Observation.  INTERVAL HISTORY: [name] 68 y.o. female with a history of a stage IIA colon cancer dating back to September 2006 is here for follow-up. The patient was last seen here by me on 05/17/2013. The patient reports weight lost and is now concerned about recurrence of her colon cancer.  There is no evidence of blood in her stools, melena. She does endorsee  lower abdominal pain with explosive diarrhea. . No nausea or vomiting. Her appetite is good but she has lost weight (nearly 10 lbs over the 2 months). She is also following with Dr. Sherwood Gambler with her last MRI of the brain was on 09/23/2010.  She was last seen by Dr. Christa See on 11/19/2013.   MEDICAL HISTORY: Past Medical History  Diagnosis Date  . Colon cancer Nov. 27,  2006  . Hypertension   . GERD (gastroesophageal reflux disease)   . Diabetes mellitus     Type 2  . Hypercholesterolemia   . Gastritis     INTERIM HISTORY: has History of colon cancer; MENINGIOMA; DM (diabetes mellitus), type 2, uncontrolled; HYPERTRIGLYCERIDEMIA; ANEMIA, IRON DEFICIENCY, UNSPEC.; ANXIETY; DEPRESSIVE DISORDER, NOS; MORTON'S NEUROMA; HYPERTENSION, BENIGN ESSENTIAL; CORONARY, ARTERIOSCLEROSIS; EXTERNAL HEMORRHOIDS; RHINITIS, ALLERGIC NOS; GASTROESOPHAGEAL REFLUX, NO ESOPHAGITIS; COLONIC POLYPS, ADENOMATOUS, BENIGN; FIBROADENOSIS, BREAST; VERTIGO, CHRONIC; Lateral epicondylitis of right elbow; Post-menopausal; Right shoulder pain; Pruritus; Hematuria - cause not known; Urine incontinence; Diarrhea; Abdominal pain, epigastric; Headache(784.0); Dyspnea; Nausea with vomiting; and Unintended weight loss on her problem list.     ALLERGIES:  has No Known Allergies.  MEDICATIONS: has a current medication list which includes the following prescription(s): aspirin, carvedilol, cetirizine, diazepam, esomeprazole, fluticasone, glipizide, glucose blood, ibuprofen, insulin glargine, metoclopramide, nitroglycerin, ondansetron, quinapril-hydrochlorothiazide, travoprost (bak free), and triamcinolone ointment.  SURGICAL HISTORY:  Past Surgical History  Procedure Laterality Date  . Right colectomy  Nov. 27, 2006  . Cardiac catheterization  2005  . Tmj arthroplasty    . Laparoscopic nissen fundoplication  0000000  . Repair prolapsedbladder    . Abdominal hysterectomy  1976   PROBLEM LIST: 1. 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 The patient underwent laparoscopic assisted right colectomy by Dr. Jackolyn Confer on 03/07/2005. The primary tumor measured 5.5 cm. The cancer invaded through the muscularis propria into the underlying pericolonic fibroadipose tissue. Lymphovascular invasion was identified. Surgical margins were negative. All 10 lymph nodes were negative. Tumor stage was T3 N0 IIA. The patient received adjuvant chemotherapy by Dr. Jerold Coombe Odogwu consisting of 4 cycles of FOLFOX and 8 cycles of chemotherapy with 5 fluorouracil leucovorin and 5FU by continuous infusion from 04/27/2005 through 10/13/2005. The patient remains without evidence of disease.  2. Meningioma. The patient apparently sees Dr. Jovita Gamma and is supposed to be having yearly imaging studies. She did have an MRI of the brain with and without IV contrast on 09/23/2010. 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 This was only a mm larger than seen previously on the MRI of 04/09/2009.  3. Hypertension.  4. Diabetes mellitus.  5. GERD.  6. Prolapsed bladder.  REVIEW OF SYSTEMS:   Constitutional: Denies fevers, chills or abnormal  weight  loss Eyes: Denies blurriness of vision Ears, nose, mouth, throat, and face: Denies mucositis or sore throat Respiratory: Denies cough, dyspnea or wheezes Cardiovascular: Denies palpitation, chest discomfort or lower extremity swelling Gastrointestinal:  Denies nausea, heartburn;  changes in bowel habits as noted above Skin: Denies abnormal skin rashes Lymphatics: Denies new lymphadenopathy or easy bruising Neurological:Denies numbness, tingling or new weaknesses Behavioral/Psych: Mood is stable, no new changes  All other systems were reviewed with the patient and are negative.  PHYSICAL EXAMINATION: ECOG PERFORMANCE STATUS: 0 - Asymptomatic  Blood pressure 158/62, pulse 55, temperature 98 F (36.7 C), temperature source Oral, resp. rate 17, height 5\' 4"  (1.626 m), weight 145 lb 9.6 oz (66.044 kg), SpO2 100.00%.  GENERAL:alert, no distress and comfortable; elderly female SKIN: skin color, texture, turgor are normal, no rashes or significant lesions EYES: normal, Conjunctiva are pink and non-injected, sclera clear OROPHARYNX:no exudate, no erythema and lips, buccal mucosa, and tongue normal  NECK: supple, thyroid normal size, non-tender, without nodularity LYMPH:  no palpable lymphadenopathy in the cervical, axillary or supraclavicular LUNGS: clear to auscultation  with normal breathing effort HEART: regular rate & rhythm and no murmurs and no lower extremity edema ABDOMEN:abdomen soft, non-tender and normal bowel sounds Musculoskeletal:no cyanosis of digits and no clubbing  NEURO: alert & oriented x 3 with fluent speech, no focal motor/sensory deficits  Labs:  Lab Results  Component Value Date   WBC 8.6 11/19/2013   HGB 10.8* 11/19/2013   HCT 31.7* 11/19/2013   MCV 82.8 11/19/2013   PLT 238 11/19/2013   NEUTROABS 4.8 11/19/2013      Chemistry      Component Value Date/Time   NA 138 11/19/2013 1518   NA 143 05/17/2013 1341   K 3.9 11/19/2013 1518   K 3.6 05/17/2013 1341   CL 101  11/19/2013 1518   CL 101 05/11/2012 1544   CO2 27 11/19/2013 1518   CO2 27 05/17/2013 1341   BUN 22 11/19/2013 1518   BUN 27.0* 05/17/2013 1341   CREATININE 1.16* 11/19/2013 1518   CREATININE 1.4* 05/17/2013 1341   CREATININE 0.92 05/04/2011 1356      Component Value Date/Time   CALCIUM 9.9 11/19/2013 1518   CALCIUM 10.2 05/17/2013 1341   ALKPHOS 64 11/19/2013 1518   ALKPHOS 67 05/17/2013 1341   AST 11 11/19/2013 1518   AST 13 05/17/2013 1341   ALT 16 11/19/2013 1518   ALT 9 05/17/2013 1341   BILITOT 0.5 11/19/2013 1518   BILITOT 0.82 05/17/2013 1341     Basic Metabolic Panel: No results found for this basename: NA, K, CL, CO2, GLUCOSE, BUN, CREATININE, CALCIUM, MG, PHOS,  in the last 168 hours GFR Estimated Creatinine Clearance: 44 ml/min (by C-G formula based on Cr of 1.16). Liver Function Tests: No results found for this basename: AST, ALT, ALKPHOS, BILITOT, PROT, ALBUMIN,  in the last 168 hours CBC: No results found for this basename: WBC, NEUTROABS, HGB, HCT, MCV, PLT,  in the last 168 hours Studies:  No results found.   RADIOGRAPHIC STUDIES: 1. Digital screening mammogram on 05/09/2011 was negative.  2. Chest x-ray, 2 view, on 05/11/2012 was negative.  3. 10/19/2012  MRI HEAD WITHOUT CONTRAST  Technique: Multiplanar, multiecho pulse sequences of the brain and surrounding structures were obtained according to standard protocol without intravenous contrast. Comparison: 09/23/2010 Findings: Diffusion imaging does not show any acute or subacute infarction. The brainstem and cerebellum are normal. The cerebral hemispheres show chronic small vessel changes  within the deep white  matter, similar to the previous exam. No cortical or large vessel territory infarction. No intra-axial mass lesion, hemorrhage, hydrocephalus or extra-axial collection. 8 mm right anterior falcine meningioma appears unchanged, without significant mass effect upon the brain. No pituitary mass. No fluid in the sinuses, middle ears  or mastoids.  IMPRESSION: No acute finding. No change since the previous study. Chronic small vessel change of the hemispheric white matter. 8 mm right- sided anterior falcine meningioma is unchanged and probably not  significant.  4. Digital screening mammogram on 05/31/2013 was negative.   ASSESSMENT: [name] 68 y.o. female with a history of History of colon cancer - Plan: CT Abdomen Wo Contrast, CBC with Differential, Ferritin, Iron and TIBC [facility]   PLAN:   1.  Colon Cancer. --Ms. Blankley is clinically having weight lost and stool changes concerning for reoccurrence, although she is now nearly 9 years from her diagnosis of her colon cancer.  We will obtain a CT of abdomen without contrast to rule out colon cancer.   Her CEA is stable.  She will be back in the office in 2 weeks to discuss the results. The patient is up to date, having had her colonoscopy on 06/14/2011.   2. Meningoma, stable. -- She is being followed by Dr. Donnella Bi office.  Her last MRI of the brain demonstrated no acute finding with an 8 mm right-sided anterior falcine meningioma that was unchanged.   3. Follow up.  --The patient will follow up in 2 weeks and have CT of abdomen without contrast (due to mild renal insuffiency and diabetes) in one week.     All questions were answered. The patient knows to call the clinic with any problems, questions or concerns. We can certainly see the patient much sooner if necessary.  I spent 15 minutes counseling the patient face to face. The total time spent in the appointment was 25 minutes.    Kaysha Parsell, MD 11/28/2013 9:28 AM

## 2013-11-27 NOTE — Telephone Encounter (Signed)
Pt confirmed labs/ov per 08/19 POF, gave pt AVS....KJ °

## 2013-11-28 ENCOUNTER — Other Ambulatory Visit: Payer: Self-pay | Admitting: Medical Oncology

## 2013-11-28 DIAGNOSIS — Z85038 Personal history of other malignant neoplasm of large intestine: Secondary | ICD-10-CM

## 2013-11-28 LAB — CEA: CEA: 1 ng/mL (ref 0.0–5.0)

## 2013-12-02 ENCOUNTER — Ambulatory Visit (HOSPITAL_COMMUNITY)
Admission: RE | Admit: 2013-12-02 | Discharge: 2013-12-02 | Disposition: A | Discharge status: Home / Self Care | Payer: Medicare Other | Source: Ambulatory Visit | Attending: Internal Medicine | Admitting: Internal Medicine

## 2013-12-02 DIAGNOSIS — Z85038 Personal history of other malignant neoplasm of large intestine: Secondary | ICD-10-CM

## 2013-12-02 DIAGNOSIS — R634 Abnormal weight loss: Secondary | ICD-10-CM | POA: Insufficient documentation

## 2013-12-02 DIAGNOSIS — I2510 Atherosclerotic heart disease of native coronary artery without angina pectoris: Secondary | ICD-10-CM | POA: Diagnosis not present

## 2013-12-04 ENCOUNTER — Other Ambulatory Visit (HOSPITAL_BASED_OUTPATIENT_CLINIC_OR_DEPARTMENT_OTHER): Payer: Medicare Other

## 2013-12-04 ENCOUNTER — Ambulatory Visit (HOSPITAL_BASED_OUTPATIENT_CLINIC_OR_DEPARTMENT_OTHER): Payer: Medicare Other | Admitting: Internal Medicine

## 2013-12-04 ENCOUNTER — Telehealth: Payer: Self-pay | Admitting: Internal Medicine

## 2013-12-04 VITALS — BP 136/42 | HR 55 | Temp 98.1°F | Resp 20 | Ht 64.0 in | Wt 146.5 lb

## 2013-12-04 DIAGNOSIS — D320 Benign neoplasm of cerebral meninges: Secondary | ICD-10-CM

## 2013-12-04 DIAGNOSIS — Z85038 Personal history of other malignant neoplasm of large intestine: Secondary | ICD-10-CM

## 2013-12-04 LAB — CBC WITH DIFFERENTIAL/PLATELET
BASO%: 0.5 % (ref 0.0–2.0)
Basophils Absolute: 0 10*3/uL (ref 0.0–0.1)
EOS%: 1.4 % (ref 0.0–7.0)
Eosinophils Absolute: 0.1 10*3/uL (ref 0.0–0.5)
HCT: 32.5 % — ABNORMAL LOW (ref 34.8–46.6)
HGB: 10.7 g/dL — ABNORMAL LOW (ref 11.6–15.9)
LYMPH%: 31.4 % (ref 14.0–49.7)
MCH: 28.4 pg (ref 25.1–34.0)
MCHC: 32.8 g/dL (ref 31.5–36.0)
MCV: 86.5 fL (ref 79.5–101.0)
MONO#: 0.4 10*3/uL (ref 0.1–0.9)
MONO%: 5.9 % (ref 0.0–14.0)
NEUT#: 4.6 10*3/uL (ref 1.5–6.5)
NEUT%: 60.8 % (ref 38.4–76.8)
Platelets: 222 10*3/uL (ref 145–400)
RBC: 3.76 10*6/uL (ref 3.70–5.45)
RDW: 13 % (ref 11.2–14.5)
WBC: 7.5 10*3/uL (ref 3.9–10.3)
lymph#: 2.4 10*3/uL (ref 0.9–3.3)

## 2013-12-04 LAB — IRON AND TIBC CHCC
%SAT: 32 % (ref 21–57)
Iron: 93 ug/dL (ref 41–142)
TIBC: 290 ug/dL (ref 236–444)
UIBC: 197 ug/dL (ref 120–384)

## 2013-12-04 LAB — FERRITIN CHCC: Ferritin: 219 ng/ml (ref 9–269)

## 2013-12-04 NOTE — Progress Notes (Signed)
[place], MD [address] [place] [address]  DIAGNOSIS: History of colon cancer  Chief Complaint  Patient presents with  . History of colon cancer   CURRENT THERAPY: Observation.  INTERVAL HISTORY: [name] 68 y.o. female with a history of a stage IIA colon cancer dating back to September 2006 is here for follow-up. The patient was last seen here by me on 11/27/2013. The patient reports weight lost and was now concerned about recurrence of her colon cancer.  There is no evidence of blood in her stools, melena. She does endorse  lower abdominal pain with explosive diarrhea. She had an interim Ct scan of her abdomen and is here to discuss the results. Her appetite is good but she has lost weight (nearly 10 lbs over the 2 months). She is also following with Dr. Sherwood Gambler with her last MRI of the brain was on 09/23/2010.  She was last seen by Dr. Christa See on 11/19/2013.   MEDICAL HISTORY: Past Medical History  Diagnosis Date  . Colon cancer Nov. 27,  2006  . Hypertension   . GERD (gastroesophageal reflux disease)   . Diabetes mellitus     Type 2  . Hypercholesterolemia   . Gastritis     INTERIM HISTORY: has History of colon cancer; MENINGIOMA; DM (diabetes mellitus), type 2, uncontrolled; HYPERTRIGLYCERIDEMIA; ANEMIA, IRON DEFICIENCY, UNSPEC.; ANXIETY; DEPRESSIVE DISORDER, NOS; MORTON'S NEUROMA; HYPERTENSION, BENIGN ESSENTIAL; CORONARY, ARTERIOSCLEROSIS; EXTERNAL HEMORRHOIDS; RHINITIS, ALLERGIC NOS; GASTROESOPHAGEAL REFLUX, NO ESOPHAGITIS; COLONIC POLYPS, ADENOMATOUS, BENIGN; FIBROADENOSIS, BREAST; VERTIGO, CHRONIC; Lateral epicondylitis of right elbow; Post-menopausal; Right shoulder pain; Pruritus; Hematuria - cause not known; Urine incontinence; Diarrhea; Abdominal pain, epigastric; Headache(784.0); Dyspnea; Nausea with vomiting; and Unintended weight loss on her problem list.    ALLERGIES:  has No  Known Allergies.  MEDICATIONS: has a current medication list which includes the following prescription(s): aspirin, carvedilol, cetirizine, diazepam, esomeprazole, fluticasone, glipizide, glucose blood, ibuprofen, insulin glargine, metoclopramide, nitroglycerin, ondansetron, quinapril-hydrochlorothiazide, travoprost (bak free), and triamcinolone ointment.  SURGICAL HISTORY:  Past Surgical History  Procedure Laterality Date  . Right colectomy  Nov. 27, 2006  . Cardiac catheterization  2005  . Tmj arthroplasty    . Laparoscopic nissen fundoplication  0000000  . Repair prolapsedbladder    . Abdominal hysterectomy  1976   PROBLEM LIST: 1. 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 The patient underwent laparoscopic assisted right colectomy by Dr. Jackolyn Confer on 03/07/2005. The primary tumor measured 5.5 cm. The cancer invaded through the muscularis propria into the underlying pericolonic fibroadipose tissue. Lymphovascular invasion was identified. Surgical margins were negative. All 10 lymph nodes were negative. Tumor stage was T3 N0 IIA. The patient received adjuvant chemotherapy by Dr. Jerold Coombe Odogwu consisting of 4 cycles of FOLFOX and 8 cycles of chemotherapy with 5 fluorouracil leucovorin and 5FU by continuous infusion from 04/27/2005 through 10/13/2005. The patient remains without evidence of disease.  2. Meningioma. The patient apparently sees Dr. Jovita Gamma and is supposed to be having yearly imaging studies. She did have an MRI of the brain with and without IV contrast on 09/23/2010. 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 This was only a mm larger than seen previously on the MRI of 04/09/2009.  3. Hypertension.  4. Diabetes mellitus.  5. GERD.  6. Prolapsed bladder.  REVIEW OF SYSTEMS:   Constitutional: Denies fevers, chills but positive  for weight loss Eyes: Denies  blurriness of vision Ears, nose, mouth, throat, and face: Denies mucositis or sore throat Respiratory: Denies cough, dyspnea or wheezes Cardiovascular: Denies palpitation, chest discomfort or lower extremity swelling Gastrointestinal:  Denies nausea, heartburn;  changes in bowel habits as noted above Skin: Denies abnormal skin rashes Lymphatics: Denies new lymphadenopathy or easy bruising Neurological:Denies numbness, tingling or new weaknesses Behavioral/Psych: Mood is stable, no new changes  All other systems were reviewed with the patient and are negative.  PHYSICAL EXAMINATION: ECOG PERFORMANCE STATUS: 0 - Asymptomatic  Blood pressure 136/42, pulse 55, temperature 98.1 F (36.7 C), temperature source Oral, resp. rate 20, height 5\' 4"  (1.626 m), weight 146 lb 8 oz (66.452 kg).  GENERAL:alert, no distress and comfortable; elderly female SKIN: skin color, texture, turgor are normal, no rashes or significant lesions EYES: normal, Conjunctiva are pink and non-injected, sclera clear OROPHARYNX:no exudate, no erythema and lips, buccal mucosa, and tongue normal  NECK: supple, thyroid normal size, non-tender, without nodularity LYMPH:  no palpable lymphadenopathy in the cervical, axillary or supraclavicular LUNGS: clear to auscultation  with normal breathing effort HEART: regular rate & rhythm and no murmurs and no lower extremity edema ABDOMEN:abdomen soft, non-tender and normal bowel sounds Musculoskeletal:no cyanosis of digits and no clubbing  NEURO: alert & oriented x 3 with fluent speech, no focal motor/sensory deficits  Labs:  Lab Results  Component Value Date   WBC 7.5 12/04/2013   HGB 10.7* 12/04/2013   HCT 32.5* 12/04/2013   MCV 86.5 12/04/2013   PLT 222 12/04/2013   NEUTROABS 4.6 12/04/2013      Chemistry      Component Value Date/Time   NA 138 11/19/2013 1518   NA 143 05/17/2013 1341   K 3.9 11/19/2013 1518   K 3.6 05/17/2013 1341   CL 101 11/19/2013 1518   CL 101 05/11/2012  1544   CO2 27 11/19/2013 1518   CO2 27 05/17/2013 1341   BUN 22 11/19/2013 1518   BUN 27.0* 05/17/2013 1341   CREATININE 1.16* 11/19/2013 1518   CREATININE 1.4* 05/17/2013 1341   CREATININE 0.92 05/04/2011 1356      Component Value Date/Time   CALCIUM 9.9 11/19/2013 1518   CALCIUM 10.2 05/17/2013 1341   ALKPHOS 64 11/19/2013 1518   ALKPHOS 67 05/17/2013 1341   AST 11 11/19/2013 1518   AST 13 05/17/2013 1341   ALT 16 11/19/2013 1518   ALT 9 05/17/2013 1341   BILITOT 0.5 11/19/2013 1518   BILITOT 0.82 05/17/2013 1341     Basic Metabolic Panel: No results found for this basename: NA, K, CL, CO2, GLUCOSE, BUN, CREATININE, CALCIUM, MG, PHOS,  in the last 168 hours GFR Estimated Creatinine Clearance: 44.1 ml/min (by C-G formula based on Cr of 1.16). Liver Function Tests: No results found for this basename: AST, ALT, ALKPHOS, BILITOT, PROT, ALBUMIN,  in the last 168 hours CBC:  Recent Labs Lab 12/04/13 1158  WBC 7.5  NEUTROABS 4.6  HGB 10.7*  HCT 32.5*  MCV 86.5  PLT 222   Studies:  (Personally reviewed by me) No results found.   RADIOGRAPHIC STUDIES: 1. Digital screening mammogram on 05/09/2011 was negative.  2. Chest x-ray, 2 view, on 05/11/2012 was negative.  3. 10/19/2012  MRI HEAD WITHOUT CONTRAST  Technique: Multiplanar, multiecho pulse sequences of the brain and surrounding structures were obtained according to standard protocol without intravenous contrast. Comparison: 09/23/2010 Findings: Diffusion imaging does not show any acute or subacute infarction. The brainstem and  cerebellum are normal. The cerebral hemispheres show chronic small vessel changes within the deep white  matter, similar to the previous exam. No cortical or large vessel territory infarction. No intra-axial mass lesion, hemorrhage, hydrocephalus or extra-axial collection. 8 mm right anterior falcine meningioma appears unchanged, without significant mass effect upon the brain. No pituitary mass. No fluid in the sinuses,  middle ears or mastoids.  IMPRESSION: No acute finding. No change since the previous study. Chronic small vessel change of the hemispheric white matter. 8 mm right- sided anterior falcine meningioma is unchanged and probably not  significant.  4. Digital screening mammogram on 05/31/2013 was negative.   ASSESSMENT: [name] 68 y.o. female with a history of History of colon cancer   PLAN:   1.  Colon Cancer. --Ms. Ramcharan is clinically having weight lost and stool changes concerning for reoccurrence, although she is now nearly 9 years from her diagnosis of her colon cancer.  We obtained a CT of abdomen without contrast to rule out colon cancer which is negative for malignancy.   Her CEA is stable.  The patient is up to date, having had her colonoscopy on 06/14/2011.  She will follow up in 6 months for a CEA, Labs including chemistries and CBC.  2. Meningoma, stable. -- She is being followed by Dr. Donnella Bi office.  Her last MRI of the brain demonstrated no acute finding with an 8 mm right-sided anterior falcine meningioma that was unchanged. She reports having a follow up for repeat imaging per Dr. Donnella Bi office.   3. Follow up.  --The patient will follow up  In 6 months for labs including CEA, chemistries and CBC.     All questions were answered. The patient knows to call the clinic with any problems, questions or concerns. We can certainly see the patient much sooner if necessary.  I spent 15 minutes counseling the patient face to face. The total time spent in the appointment was 25 minutes.    [employer] Null, MD 12/04/2013 3:19 PM

## 2013-12-04 NOTE — Telephone Encounter (Signed)
gv and printed appt sched and avs for pt for Feb 2016...Marland Kitchenpt wanted to keep previously sched Feb 2016 appt

## 2013-12-06 ENCOUNTER — Observation Stay (HOSPITAL_COMMUNITY)
Admission: EM | Admit: 2013-12-06 | Discharge: 2013-12-08 | Disposition: A | Discharge status: Home / Self Care | Payer: Medicare Other | Attending: Family Medicine | Admitting: Family Medicine

## 2013-12-06 ENCOUNTER — Emergency Department (HOSPITAL_COMMUNITY): Payer: Medicare Other

## 2013-12-06 ENCOUNTER — Encounter (HOSPITAL_COMMUNITY): Payer: Self-pay | Admitting: Emergency Medicine

## 2013-12-06 DIAGNOSIS — I10 Essential (primary) hypertension: Secondary | ICD-10-CM | POA: Diagnosis not present

## 2013-12-06 DIAGNOSIS — E7800 Pure hypercholesterolemia, unspecified: Secondary | ICD-10-CM | POA: Insufficient documentation

## 2013-12-06 DIAGNOSIS — R079 Chest pain, unspecified: Secondary | ICD-10-CM | POA: Insufficient documentation

## 2013-12-06 DIAGNOSIS — Z7982 Long term (current) use of aspirin: Secondary | ICD-10-CM | POA: Diagnosis not present

## 2013-12-06 DIAGNOSIS — R110 Nausea: Secondary | ICD-10-CM | POA: Diagnosis not present

## 2013-12-06 DIAGNOSIS — E119 Type 2 diabetes mellitus without complications: Secondary | ICD-10-CM | POA: Insufficient documentation

## 2013-12-06 DIAGNOSIS — E876 Hypokalemia: Secondary | ICD-10-CM

## 2013-12-06 DIAGNOSIS — K2970 Gastritis, unspecified, without bleeding: Secondary | ICD-10-CM | POA: Diagnosis not present

## 2013-12-06 DIAGNOSIS — K219 Gastro-esophageal reflux disease without esophagitis: Secondary | ICD-10-CM | POA: Insufficient documentation

## 2013-12-06 DIAGNOSIS — R0602 Shortness of breath: Secondary | ICD-10-CM | POA: Insufficient documentation

## 2013-12-06 DIAGNOSIS — Z9889 Other specified postprocedural states: Secondary | ICD-10-CM | POA: Insufficient documentation

## 2013-12-06 DIAGNOSIS — Z79899 Other long term (current) drug therapy: Secondary | ICD-10-CM | POA: Diagnosis not present

## 2013-12-06 DIAGNOSIS — E1159 Type 2 diabetes mellitus with other circulatory complications: Secondary | ICD-10-CM | POA: Diagnosis present

## 2013-12-06 DIAGNOSIS — IMO0002 Reserved for concepts with insufficient information to code with codable children: Secondary | ICD-10-CM | POA: Diagnosis not present

## 2013-12-06 DIAGNOSIS — R29898 Other symptoms and signs involving the musculoskeletal system: Secondary | ICD-10-CM

## 2013-12-06 DIAGNOSIS — Z794 Long term (current) use of insulin: Secondary | ICD-10-CM | POA: Insufficient documentation

## 2013-12-06 DIAGNOSIS — Z85038 Personal history of other malignant neoplasm of large intestine: Secondary | ICD-10-CM | POA: Diagnosis not present

## 2013-12-06 DIAGNOSIS — R1013 Epigastric pain: Secondary | ICD-10-CM

## 2013-12-06 DIAGNOSIS — R0789 Other chest pain: Secondary | ICD-10-CM | POA: Diagnosis not present

## 2013-12-06 DIAGNOSIS — I152 Hypertension secondary to endocrine disorders: Secondary | ICD-10-CM | POA: Diagnosis present

## 2013-12-06 DIAGNOSIS — R42 Dizziness and giddiness: Secondary | ICD-10-CM

## 2013-12-06 DIAGNOSIS — I2510 Atherosclerotic heart disease of native coronary artery without angina pectoris: Secondary | ICD-10-CM | POA: Diagnosis present

## 2013-12-06 DIAGNOSIS — Z9861 Coronary angioplasty status: Secondary | ICD-10-CM | POA: Diagnosis present

## 2013-12-06 DIAGNOSIS — R001 Bradycardia, unspecified: Secondary | ICD-10-CM

## 2013-12-06 DIAGNOSIS — E1122 Type 2 diabetes mellitus with diabetic chronic kidney disease: Secondary | ICD-10-CM | POA: Diagnosis present

## 2013-12-06 DIAGNOSIS — K2990 Gastroduodenitis, unspecified, without bleeding: Secondary | ICD-10-CM

## 2013-12-06 DIAGNOSIS — E1165 Type 2 diabetes mellitus with hyperglycemia: Secondary | ICD-10-CM

## 2013-12-06 LAB — BASIC METABOLIC PANEL
Anion gap: 12 (ref 5–15)
BUN: 22 mg/dL (ref 6–23)
CO2: 27 mEq/L (ref 19–32)
Calcium: 9.4 mg/dL (ref 8.4–10.5)
Chloride: 102 mEq/L (ref 96–112)
Creatinine, Ser: 1.21 mg/dL — ABNORMAL HIGH (ref 0.50–1.10)
GFR calc Af Amer: 52 mL/min — ABNORMAL LOW (ref >90)
GFR calc non Af Amer: 45 mL/min — ABNORMAL LOW (ref >90)
Glucose, Bld: 189 mg/dL — ABNORMAL HIGH (ref 70–99)
Potassium: 3.4 mEq/L — ABNORMAL LOW (ref 3.7–5.3)
Sodium: 141 mEq/L (ref 137–147)

## 2013-12-06 LAB — PRO B NATRIURETIC PEPTIDE: Pro B Natriuretic peptide (BNP): 267.7 pg/mL — ABNORMAL HIGH (ref 0–125)

## 2013-12-06 LAB — CBC
HCT: 30.9 % — ABNORMAL LOW (ref 36.0–46.0)
Hemoglobin: 10.5 g/dL — ABNORMAL LOW (ref 12.0–15.0)
MCH: 28.7 pg (ref 26.0–34.0)
MCHC: 34 g/dL (ref 30.0–36.0)
MCV: 84.4 fL (ref 78.0–100.0)
Platelets: 244 10*3/uL (ref 150–400)
RBC: 3.66 MIL/uL — ABNORMAL LOW (ref 3.87–5.11)
RDW: 12.7 % (ref 11.5–15.5)
WBC: 8.7 10*3/uL (ref 4.0–10.5)

## 2013-12-06 LAB — I-STAT CHEM 8, ED
BUN: 20 mg/dL (ref 6–23)
Calcium, Ion: 1.2 mmol/L (ref 1.13–1.30)
Chloride: 102 mEq/L (ref 96–112)
Creatinine, Ser: 1.1 mg/dL (ref 0.50–1.10)
Glucose, Bld: 122 mg/dL — ABNORMAL HIGH (ref 70–99)
HCT: 30 % — ABNORMAL LOW (ref 36.0–46.0)
Hemoglobin: 10.2 g/dL — ABNORMAL LOW (ref 12.0–15.0)
Potassium: 3.2 mEq/L — ABNORMAL LOW (ref 3.7–5.3)
Sodium: 139 mEq/L (ref 137–147)
TCO2: 24 mmol/L (ref 0–100)

## 2013-12-06 LAB — TROPONIN I: Troponin I: <0.3 ng/mL (ref <0.30)

## 2013-12-06 LAB — I-STAT TROPONIN, ED: Troponin i, poc: 0 ng/mL (ref 0.00–0.08)

## 2013-12-06 MED ORDER — MORPHINE SULFATE 4 MG/ML IJ SOLN
4.0000 mg | Freq: Once | INTRAMUSCULAR | Status: AC
Start: 2013-12-06 — End: 2013-12-06
  Administered 2013-12-06: 4 mg via INTRAVENOUS
  Filled 2013-12-06: qty 1

## 2013-12-06 MED ORDER — ASPIRIN EC 325 MG PO TBEC
325.0000 mg | DELAYED_RELEASE_TABLET | Freq: Once | ORAL | Status: AC
  Administered 2013-12-06: 325 mg via ORAL
  Filled 2013-12-06: qty 1

## 2013-12-06 MED ORDER — ACETAMINOPHEN 325 MG PO TABS
650.0000 mg | ORAL_TABLET | Freq: Once | ORAL | Status: AC
  Administered 2013-12-06: 650 mg via ORAL
  Filled 2013-12-06: qty 2

## 2013-12-06 MED ORDER — NITROGLYCERIN 0.4 MG SL SUBL
0.4000 mg | SUBLINGUAL_TABLET | SUBLINGUAL | Status: AC | PRN
  Administered 2013-12-06 (×3): 0.4 mg via SUBLINGUAL
  Filled 2013-12-06: qty 1

## 2013-12-06 NOTE — ED Notes (Signed)
Admit Doctors at bedside. 

## 2013-12-06 NOTE — H&P (Signed)
[facility] Admission History and Physical Service Pager: [phone]  Patient name: [name] record number: [id number] Date of birth: July 01, 1945 Age: 68 y.o. Gender: female  Primary Care Provider: Christa See, MD Consultants: none Code Status: FULL (discussed on admission)  Chief Complaint: Chest pain  Assessment and Plan: [name] is a 68 y.o. female presenting with chest pain . PMH is significant for CAD, HTN, HLD, anxiety/depression, GERD, T2DM, chronic vertigo  #Chest pain: Patient presenting with atypical chest pain that is reproducible to palpation. Most likely MSK in origin.  Differential includes MI (no signs of ischemia on EKG) vs stable angina (CP with exertion) vs GERD (uses Nexium intermittently) vs costochondritis (TTP on lateral side of chest) vs anxiety.  Patient has multiple risk factors including CAD (with stents placed 10 yr ago), HTN, HLD, T2DM.  EKG in ED showed no ischemic changes.  IStat Troponin negative.  CXR showed no acute abnormalities. Last echo Grade 1 diastolic dysfxn EF 0000000.  -Admit with telemetry, [name] attending  -vitals per floor protocol -cycle troponins -TSH ordered -O2 PRN -Coreg decreased to 3.125, HR 51 in ED. Consider decreasing on discharge, as patient's HR runs in the 50's perpetually.  -ASA 81 -Morphine PRN pain -repeat EKG in am -consider cards consult  #CAD: normal Nuclear med study performed in 2009. Reports to having stents placed 10 years ago. - is on a daily ASA 81 mg  - ASCVD risk 26.6% and not currently on a statin, consider adding statin  - consider outpatient stress test if all labs normal   #Hx of HTN: BP was lower than normal while in the ED.  - continue home Coreg -hold accuretic.  Consider decreasing dose on discharge if BP remains soft  #Bradycardia: asymptomatic but appears to be at baseline in the 50-60's  - decrease coreg to 3.125 BID   #Hx of colon  cancer: Followed by Anne. Juliann [name]. 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 The patient underwent laparoscopic assisted right colectomy by Anne. Jackolyn [name] on 03/07/2005. Tumor stage was T3 N0 IIA. The patient received adjuvant chemotherapy by Anne. Jerold Coombe [name] consisting of 4 cycles of FOLFOX and 8 cycles of chemotherapy with 5 fluorouracil leucovorin and 5FU by continuous infusion from 04/27/2005 through 10/13/2005.  - per last visit, The patient remains without evidence of disease.   #T2DM: BG 122 in ED.  Last hgbA1c8.2 -Continue home Lantus 10u qHS and Glucotrol qam -CBGs QID  #GERD: On nexium at home.   - Protonix in hospital  #anxiety/Depressive disorder: is not currently taking any medications but mood appears to be stable.   #Meingioma: sees Anne. Jovita [name] and is supposed to be having yearly imaging studies. She did have an MRI of the brain with and without IV contrast on 09/23/2010. 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 This was only a mm larger than seen previously on the MRI of 04/09/2009.  FEN/GI: Protonix Prophylaxis: Heparin sub-q  Disposition: Admit to telemetry under [name]. Discharge to home pending cardiac r/o  History of Present Illness: [name] is a 68 y.o. female presenting with chest pain.  Patient point out that chest pain is localized on the center-left side of her chest.  It alternates being dull and sharp in nature.  The pain began at church around 7pm today.  She states that she got up to go to  the restroom and felt off balance coming back to the pew.  She reports that she sat down and slumped over.  Her family reports that she stated that she was not feeling well but patient does not seem to have good recollection of this.  She does report that she had a similar pain about 10 years ago.  It was also during that time she had coronary stents  placed.  She has not seen a cardiologist in several years.  She continues to have CP when ambulating in the ED.  Denies any SOB but admits to dizziness and imbalance.  She also admits to a mild headache and nausea but no vomiting.  Patient does note that she has also had diarrhea over the last couple weeks related to eating- stating, "I have a blow-out every time I eat".  Denies any sick contacts, cough, rhinorrhea, acid reflux.    Review Of Systems: Per HPI with the following additions: none Otherwise 12 point review of systems was performed and was unremarkable.  Patient Active Problem List   Diagnosis Date Noted  . Unintended weight loss 11/19/2013  . Nausea with vomiting 07/18/2013  . Dyspnea 02/04/2013  . Abdominal pain, epigastric 10/17/2012  . Headache(784.0) 10/17/2012  . Diarrhea 05/02/2012  . Urine incontinence 03/07/2012  . Right shoulder pain 01/09/2012  . Pruritus 01/09/2012  . Hematuria - cause not known 01/09/2012  . Post-menopausal 07/05/2011  . Lateral epicondylitis of right elbow 05/24/2011  . VERTIGO, CHRONIC 06/16/2009  . MORTON'S NEUROMA 07/09/2007  . DM (diabetes mellitus), type 2, uncontrolled 03/16/2007  . MENINGIOMA 02/07/2007  . HYPERTRIGLYCERIDEMIA 07/14/2006  . HYPERTENSION, BENIGN ESSENTIAL 07/14/2006  . RHINITIS, ALLERGIC NOS 07/14/2006  . History of colon cancer 06/08/2006  . ANEMIA, IRON DEFICIENCY, UNSPEC. 06/08/2006  . ANXIETY 06/08/2006  . DEPRESSIVE DISORDER, NOS 06/08/2006  . CORONARY, ARTERIOSCLEROSIS 06/08/2006  . GASTROESOPHAGEAL REFLUX, NO ESOPHAGITIS 06/08/2006  . FIBROADENOSIS, BREAST 06/08/2006  . EXTERNAL HEMORRHOIDS 10/23/2001  . COLONIC POLYPS, ADENOMATOUS, BENIGN 10/23/2001   Past Medical History: Past Medical History  Diagnosis Date  . Colon cancer Nov. 27,  2006  . Hypertension   . GERD (gastroesophageal reflux disease)   . Diabetes mellitus     Type 2  . Hypercholesterolemia   . Gastritis    Past Surgical  History: Past Surgical History  Procedure Laterality Date  . Right colectomy  Nov. 27, 2006  . Cardiac catheterization  2005  . Tmj arthroplasty    . Laparoscopic nissen fundoplication  0000000  . Repair prolapsedbladder    . Abdominal hysterectomy  1976   Social History: History  Substance Use Topics  . Smoking status: Never Smoker   . Smokeless tobacco: Never Used  . Alcohol Use: No   Additional social history: none Please also refer to relevant sections of EMR.  Family History: Family History  Problem Relation Age of Onset  . Lymphoma Other   . Colon cancer Neg Hx   . Stomach cancer Neg Hx    Allergies and Medications: No Known Allergies No current facility-administered medications on file prior to encounter.   Current Outpatient Prescriptions on File Prior to Encounter  Medication Sig Dispense Refill  . aspirin 81 MG chewable tablet Chew 81 mg by mouth daily.        . carvedilol (COREG) 12.5 MG tablet Take 1 tablet (12.5 mg total) by mouth 2 (two) times daily with a meal.  60 tablet  3  . cetirizine (ZYRTEC) 10 MG tablet Take  1 tablet (10 mg total) by mouth daily.  30 tablet  11  . diazepam (VALIUM) 5 MG tablet Take 1 tablet (5 mg total) by mouth every 8 (eight) hours as needed for anxiety. Take 1/2 to 1 tab by mouth three times a day while having severe vertigo symptoms.  30 tablet  0  . esomeprazole (NEXIUM) 40 MG packet Take 40 mg by mouth daily before breakfast.  30 each  12  . fluticasone (FLONASE) 50 MCG/ACT nasal spray Place 2 sprays into the nose daily.  16 g  6  . glipiZIDE (GLUCOTROL) 10 MG tablet Take 1 tablet (10 mg total) by mouth daily before breakfast.  30 tablet  1  . glucose blood test strip Check sugars three times daily  100 each  12  . ibuprofen (ADVIL,MOTRIN) 200 MG tablet Take 600 mg by mouth as needed.      . insulin glargine (LANTUS) 100 UNIT/ML injection Inject 10 Units into the skin daily. Dispense 1 month supply      . metoCLOPramide (REGLAN) 5  MG tablet Take 1 tablet (5 mg total) by mouth 3 (three) times daily before meals.  90 tablet  1  . nitroGLYCERIN (NITROSTAT) 0.4 MG SL tablet Place 1 tablet (0.4 mg total) under the tongue every 5 (five) minutes as needed. If chest pain not relieved by third tab call 911.  5 tablet  6  . ondansetron (ZOFRAN-ODT) 8 MG disintegrating tablet Take 1 tablet (8 mg total) by mouth every 8 (eight) hours as needed for nausea.  30 tablet  3  . quinapril-hydrochlorothiazide (ACCURETIC) 20-12.5 MG per tablet Take 2 tablets by mouth daily.  60 tablet  3  . Travoprost, BAK Free, (TRAVATAN) 0.004 % SOLN ophthalmic solution Place 1 drop into both eyes at bedtime.      . triamcinolone ointment (KENALOG) 0.1 % Apply 1 application topically 2 (two) times daily.  454 g  0    Objective: BP 139/58  Pulse 50  Temp(Src) 99.2 F (37.3 C) (Oral)  Resp 19  Ht 5\' 4"  (1.626 m)  Wt 145 lb (65.772 kg)  BMI 24.88 kg/m2  SpO2 100% Exam: General: well nourished, elderly AA female sitting up in bed, NAD, family at bedside HEENT: AT/Emily, MMM, PERRLA, EOMI, no cervical LAD, erythematous ring around oral cavity congruent with perioral dermatitis Cardiovascular: S1S2, RRR, no murmurs Chest: pain reproducible to touch, centrally located. Exacerbation of pain with extension of left arm but not left.  Respiratory: globally decreased breath sounds but CTAB, no increased work of breathing, speaks in full sentences Abdomen: obese, soft, ND, patient notes that there is mild tenderness but is unable to tell if it's TTP or 2/2 being hungry, no guarding, no rebound tenderness Extremities: thin, WWP, +1 pedal pulses Skin: dry, intact, no rashes or lesions noted Neuro: alert and oriented, CN 2-12 grossly in tact, gait not assessed.   Labs and Imaging: CBC BMET   Recent Labs Lab 12/06/13 1935  WBC 8.7  HGB 10.5*  HCT 30.9*  PLT 244    Recent Labs Lab 12/06/13 1935  NA 141  K 3.4*  CL 102  CO2 27  BUN 22  CREATININE 1.21*   GLUCOSE 189*  CALCIUM 9.4         Recent Labs Lab 12/06/13 2235 12/07/13 0118 12/07/13 0514  TROPONINI <0.30 <0.30 <0.30   EKG: sinus bradycardia, PVC's   Janora Norlander, DO 12/06/2013, 10:27 PM PGY-1, [facility]  pager: [phone], text pages welcome  Upper Level Addendum:  I have seen and evaluated this patient along with Anne. Lajuana Ripple and reviewed the above note, making necessary revisions in [facility].   Clearance Coots, MD Family Medicine PGY-2

## 2013-12-06 NOTE — ED Notes (Signed)
Pt. reports left chest pain with mild SOB , nausea and headache onset this evening , denies emesis or diaphoresis .

## 2013-12-06 NOTE — ED Provider Notes (Signed)
CSN: [id number]     Arrival date & time 12/06/13  1919 History   First MD Initiated Contact with Patient 12/06/13 2048     Chief Complaint  Patient presents with  . Chest Pain     (Consider location/radiation/quality/duration/timing/severity/associated sxs/prior Treatment) Patient is a 68 y.o. female presenting with chest pain. The history is provided by the patient.  Chest Pain Pain quality: dull and sharp   Pain radiates to:  Does not radiate Pain radiates to the back: no   Pain severity:  Moderate Onset quality:  Gradual Duration:  2 hours Timing:  Constant Progression:  Worsening Chronicity:  Recurrent Context: at rest   Context comment:  Exrtion Relieved by:  Nothing Worsened by:  Nothing tried Ineffective treatments:  None tried Associated symptoms: nausea and shortness of breath     Past Medical History  Diagnosis Date  . Colon cancer Nov. 27,  2006  . Hypertension   . GERD (gastroesophageal reflux disease)   . Diabetes mellitus     Type 2  . Hypercholesterolemia   . Gastritis    Past Surgical History  Procedure Laterality Date  . Right colectomy  Nov. 27, 2006  . Cardiac catheterization  2005  . Tmj arthroplasty    . Laparoscopic nissen fundoplication  0000000  . Repair prolapsedbladder    . Abdominal hysterectomy  1976   Family History  Problem Relation Age of Onset  . Lymphoma Other   . Colon cancer Neg Hx   . Stomach cancer Neg Hx    History  Substance Use Topics  . Smoking status: Never Smoker   . Smokeless tobacco: Never Used  . Alcohol Use: No   OB History   Grav Para Term Preterm Abortions TAB SAB Ect Mult Living                 Review of Systems  Respiratory: Positive for shortness of breath.   Cardiovascular: Positive for chest pain.  Gastrointestinal: Positive for nausea.  All other systems reviewed and are negative.     Allergies  Review of patient's allergies indicates no known allergies.  Home Medications   Prior to  Admission medications   Medication Sig Start Date End Date Taking? Authorizing Provider  aspirin 81 MG chewable tablet Chew 81 mg by mouth daily.      Historical Provider, MD  carvedilol (COREG) 12.5 MG tablet Take 1 tablet (12.5 mg total) by mouth 2 (two) times daily with a meal. 11/19/13   Sharon Mt Street, MD  cetirizine (ZYRTEC) 10 MG tablet Take 1 tablet (10 mg total) by mouth daily. 06/03/13   Kandis Nab, MD  diazepam (VALIUM) 5 MG tablet Take 1 tablet (5 mg total) by mouth every 8 (eight) hours as needed for anxiety. Take 1/2 to 1 tab by mouth three times a day while having severe vertigo symptoms. 05/27/13   Kandis Nab, MD  esomeprazole (NEXIUM) 40 MG packet Take 40 mg by mouth daily before breakfast. 11/19/13   Sharon Mt Street, MD  fluticasone [facility]) 50 MCG/ACT nasal spray Place 2 sprays into the nose daily. 02/04/13   Kandis Nab, MD  glipiZIDE (GLUCOTROL) 10 MG tablet Take 1 tablet (10 mg total) by mouth daily before breakfast. 11/19/13   Sharon Mt Street, MD  glucose blood test strip Check sugars three times daily 02/04/13   Kandis Nab, MD  ibuprofen (ADVIL,MOTRIN) 200 MG tablet Take 600 mg by mouth as needed.    Historical  Provider, MD  insulin glargine (LANTUS) 100 UNIT/ML injection Inject 10 Units into the skin daily. Dispense 1 month supply 10/17/12   Kandis Nab, MD  metoCLOPramide (REGLAN) 5 MG tablet Take 1 tablet (5 mg total) by mouth 3 (three) times daily before meals. 11/19/13   [place], MD  nitroGLYCERIN (NITROSTAT) 0.4 MG SL tablet Place 1 tablet (0.4 mg total) under the tongue every 5 (five) minutes as needed. If chest pain not relieved by third tab call 911. 05/27/13   Kandis Nab, MD  ondansetron (ZOFRAN-ODT) 8 MG disintegrating tablet Take 1 tablet (8 mg total) by mouth every 8 (eight) hours as needed for nausea. 11/19/13   [place], MD  quinapril-hydrochlorothiazide (ACCURETIC) 20-12.5 MG per tablet Take 2  tablets by mouth daily. 11/19/13   [place], MD  Travoprost, BAK Free, (TRAVATAN) 0.004 % SOLN ophthalmic solution Place 1 drop into both eyes at bedtime.    Historical Provider, MD  triamcinolone ointment (KENALOG) 0.1 % Apply 1 application topically 2 (two) times daily. 06/03/13   Kandis Nab, MD   BP 109/69  Pulse 56  Temp(Src) 99.2 F (37.3 C) (Oral)  Resp 16  Ht 5\' 4"  (1.626 m)  Wt 145 lb (65.772 kg)  BMI 24.88 kg/m2  SpO2 100% Physical Exam  Constitutional: She is oriented to person, place, and time. She appears well-developed and well-nourished. No distress.  HENT:  Head: Normocephalic and atraumatic.  Right Ear: External ear normal.  Left Ear: External ear normal.  Eyes: Conjunctivae and EOM are normal. Right eye exhibits no discharge. Left eye exhibits no discharge.  Neck: Normal range of motion. Neck supple. No JVD present.  Cardiovascular: Normal rate, regular rhythm and normal heart sounds.  Exam reveals no gallop and no friction rub.   No murmur heard. Pulmonary/Chest: Effort normal and breath sounds normal. No stridor. No respiratory distress. She has no wheezes. She has no rales. She exhibits no tenderness.  Abdominal: Soft. Bowel sounds are normal. She exhibits no distension. There is no tenderness. There is no rebound and no guarding.  Musculoskeletal: Normal range of motion. She exhibits no edema.  Neurological: She is alert and oriented to person, place, and time.  Skin: Skin is warm. No rash noted. She is not diaphoretic.  Psychiatric: She has a normal mood and affect. Her behavior is normal.    ED Course  Procedures (including critical care time) Labs Review Labs Reviewed  CBC - Abnormal; Notable for the following:    RBC 3.66 (*)    Hemoglobin 10.5 (*)    HCT 30.9 (*)    All other components within normal limits  BASIC METABOLIC PANEL - Abnormal; Notable for the following:    Potassium 3.4 (*)    Glucose, Bld 189 (*)    Creatinine, Ser  1.21 (*)    GFR calc non Af Amer 45 (*)    GFR calc Af Amer 52 (*)    All other components within normal limits  PRO B NATRIURETIC PEPTIDE - Abnormal; Notable for the following:    Pro B Natriuretic peptide (BNP) 267.7 (*)    All other components within normal limits  I-STAT TROPOININ, ED    Imaging Review Dg Chest 2 View  12/06/2013   CLINICAL DATA:  Chest pain.  EXAM: CHEST  2 VIEW  COMPARISON:  05/11/2012  FINDINGS: Low lung volumes. Heart and mediastinal contours are within normal limits. No focal opacities or effusions. No acute bony  abnormality.  IMPRESSION: Low volumes.  No active cardiopulmonary disease.   Electronically Signed   By: Rolm Baptise M.D.   On: 12/06/2013 20:43     EKG Interpretation None      Date: 12/06/2013  Rate: 54  Rhythm: sinus bradycardia  QRS Axis: normal  Intervals: normal  ST/T Wave abnormalities: normal  Conduction Disutrbances:none  Narrative Interpretation: sinus bradycardia with PVcs  Old EKG Reviewed: unchanged  MDM   Final diagnoses:  Chest pain, rule out acute myocardial infarction   Patient with CAD presents with typical chest pain. Improved with ASA and NTG. No dynamic ECG changes. Trop neg. CXR neg for PTX, PNA, and cardiomegaly. No reproducible pain. Plan to admit for high risk features to trend troponin. Will admit to family medicine. Care discussed with my attending, Dr. Christy Gentles.       Kelby Aline, MD 12/06/13 2330

## 2013-12-07 ENCOUNTER — Observation Stay (HOSPITAL_COMMUNITY): Payer: Medicare Other

## 2013-12-07 DIAGNOSIS — R42 Dizziness and giddiness: Secondary | ICD-10-CM

## 2013-12-07 DIAGNOSIS — I10 Essential (primary) hypertension: Secondary | ICD-10-CM

## 2013-12-07 DIAGNOSIS — Z85038 Personal history of other malignant neoplasm of large intestine: Secondary | ICD-10-CM

## 2013-12-07 DIAGNOSIS — E1165 Type 2 diabetes mellitus with hyperglycemia: Secondary | ICD-10-CM

## 2013-12-07 DIAGNOSIS — R110 Nausea: Secondary | ICD-10-CM | POA: Diagnosis not present

## 2013-12-07 DIAGNOSIS — I2510 Atherosclerotic heart disease of native coronary artery without angina pectoris: Secondary | ICD-10-CM

## 2013-12-07 DIAGNOSIS — R079 Chest pain, unspecified: Secondary | ICD-10-CM | POA: Diagnosis not present

## 2013-12-07 DIAGNOSIS — R0789 Other chest pain: Secondary | ICD-10-CM | POA: Diagnosis not present

## 2013-12-07 DIAGNOSIS — R0602 Shortness of breath: Secondary | ICD-10-CM | POA: Diagnosis not present

## 2013-12-07 DIAGNOSIS — R1013 Epigastric pain: Secondary | ICD-10-CM

## 2013-12-07 DIAGNOSIS — IMO0001 Reserved for inherently not codable concepts without codable children: Secondary | ICD-10-CM

## 2013-12-07 LAB — BASIC METABOLIC PANEL
Anion gap: 10 (ref 5–15)
BUN: 21 mg/dL (ref 6–23)
CO2: 25 mEq/L (ref 19–32)
Calcium: 8.8 mg/dL (ref 8.4–10.5)
Chloride: 103 mEq/L (ref 96–112)
Creatinine, Ser: 1.17 mg/dL — ABNORMAL HIGH (ref 0.50–1.10)
GFR calc Af Amer: 55 mL/min — ABNORMAL LOW (ref >90)
GFR calc non Af Amer: 47 mL/min — ABNORMAL LOW (ref >90)
Glucose, Bld: 207 mg/dL — ABNORMAL HIGH (ref 70–99)
Potassium: 4 mEq/L (ref 3.7–5.3)
Sodium: 138 mEq/L (ref 137–147)

## 2013-12-07 LAB — MAGNESIUM: Magnesium: 1.7 mg/dL (ref 1.5–2.5)

## 2013-12-07 LAB — TROPONIN I
Troponin I: <0.3 ng/mL (ref <0.30)
Troponin I: <0.3 ng/mL (ref <0.30)
Troponin I: <0.3 ng/mL (ref <0.30)

## 2013-12-07 LAB — GLUCOSE, CAPILLARY
Glucose-Capillary: 212 mg/dL — ABNORMAL HIGH (ref 70–99)
Glucose-Capillary: 148 mg/dL — ABNORMAL HIGH (ref 70–99)
Glucose-Capillary: 323 mg/dL — ABNORMAL HIGH (ref 70–99)
Glucose-Capillary: 265 mg/dL — ABNORMAL HIGH (ref 70–99)

## 2013-12-07 LAB — TSH: TSH: 1.83 u[IU]/mL (ref 0.350–4.500)

## 2013-12-07 MED ORDER — CARVEDILOL 3.125 MG PO TABS
3.1250 mg | ORAL_TABLET | Freq: Two times a day (BID) | ORAL | Status: DC
  Administered 2013-12-07 – 2013-12-08 (×2): 3.125 mg via ORAL
  Filled 2013-12-07 (×5): qty 1

## 2013-12-07 MED ORDER — LATANOPROST 0.005 % OP SOLN
1.0000 [drp] | Freq: Every day | OPHTHALMIC | Status: DC
  Administered 2013-12-07 (×2): 1 [drp] via OPHTHALMIC
  Filled 2013-12-07: qty 2.5

## 2013-12-07 MED ORDER — REGADENOSON 0.4 MG/5ML IV SOLN
INTRAVENOUS | Status: AC
Start: 2013-12-07 — End: 2013-12-07
  Filled 2013-12-07: qty 5

## 2013-12-07 MED ORDER — GI COCKTAIL ~~LOC~~: 30.0000 mL | Freq: Four times a day (QID) | ORAL | Status: DC | PRN

## 2013-12-07 MED ORDER — ACETAMINOPHEN 325 MG PO TABS
650.0000 mg | ORAL_TABLET | ORAL | Status: DC | PRN
Start: 2013-12-07 — End: 2013-12-07

## 2013-12-07 MED ORDER — TECHNETIUM TC 99M SESTAMIBI - CARDIOLITE
10.0000 | Freq: Once | INTRAVENOUS | Status: AC | PRN
  Administered 2013-12-07: 11:00:00 10 via INTRAVENOUS

## 2013-12-07 MED ORDER — MORPHINE SULFATE 2 MG/ML IJ SOLN: 2.0000 mg | INTRAMUSCULAR | Status: DC | PRN

## 2013-12-07 MED ORDER — ASPIRIN EC 81 MG PO TBEC
81.0000 mg | DELAYED_RELEASE_TABLET | Freq: Every day | ORAL | Status: DC
  Administered 2013-12-07 – 2013-12-08 (×2): 81 mg via ORAL
  Filled 2013-12-07 (×2): qty 1

## 2013-12-07 MED ORDER — ONDANSETRON HCL 4 MG/2ML IJ SOLN: 4.0000 mg | Freq: Four times a day (QID) | INTRAMUSCULAR | Status: DC | PRN

## 2013-12-07 MED ORDER — POTASSIUM CHLORIDE CRYS ER 20 MEQ PO TBCR
40.0000 meq | EXTENDED_RELEASE_TABLET | Freq: Once | ORAL | Status: AC
  Administered 2013-12-07: 40 meq via ORAL
  Filled 2013-12-07: qty 2

## 2013-12-07 MED ORDER — HEPARIN SODIUM (PORCINE) 5000 UNIT/ML IJ SOLN
5000.0000 [IU] | Freq: Three times a day (TID) | INTRAMUSCULAR | Status: DC
  Administered 2013-12-07 – 2013-12-08 (×3): 5000 [IU] via SUBCUTANEOUS
  Filled 2013-12-07 (×7): qty 1

## 2013-12-07 MED ORDER — METOCLOPRAMIDE HCL 5 MG PO TABS
5.0000 mg | ORAL_TABLET | Freq: Three times a day (TID) | ORAL | Status: DC
  Administered 2013-12-07 – 2013-12-08 (×4): 5 mg via ORAL
  Filled 2013-12-07 (×7): qty 1

## 2013-12-07 MED ORDER — TECHNETIUM TC 99M SESTAMIBI - CARDIOLITE
30.0000 | Freq: Once | INTRAVENOUS | Status: AC | PRN
  Administered 2013-12-07: 12:00:00 30 via INTRAVENOUS

## 2013-12-07 MED ORDER — PANTOPRAZOLE SODIUM 20 MG PO TBEC
20.0000 mg | DELAYED_RELEASE_TABLET | Freq: Every day | ORAL | Status: DC
  Administered 2013-12-08: 20 mg via ORAL
  Filled 2013-12-07 (×2): qty 1

## 2013-12-07 MED ORDER — GLIPIZIDE 10 MG PO TABS
10.0000 mg | ORAL_TABLET | Freq: Every day | ORAL | Status: DC
  Administered 2013-12-07 – 2013-12-08 (×2): 10 mg via ORAL
  Filled 2013-12-07 (×3): qty 1

## 2013-12-07 MED ORDER — INSULIN GLARGINE 100 UNIT/ML ~~LOC~~ SOLN
10.0000 [IU] | Freq: Every day | SUBCUTANEOUS | Status: DC
  Administered 2013-12-07 – 2013-12-08 (×2): 10 [IU] via SUBCUTANEOUS
  Filled 2013-12-07 (×3): qty 0.1

## 2013-12-07 MED ORDER — ACETAMINOPHEN 325 MG PO TABS
650.0000 mg | ORAL_TABLET | Freq: Four times a day (QID) | ORAL | Status: DC
  Administered 2013-12-07 – 2013-12-08 (×3): 650 mg via ORAL
  Filled 2013-12-07 (×3): qty 2

## 2013-12-07 MED ORDER — REGADENOSON 0.4 MG/5ML IV SOLN
0.4000 mg | Freq: Once | INTRAVENOUS | Status: AC
  Administered 2013-12-07: 0.4 mg via INTRAVENOUS

## 2013-12-07 NOTE — Progress Notes (Addendum)
I proctored 3 stress tests today, one of which had to be aborted early due to near-syncope and need for cardiac catheterization - that was NOT this patient despite the result that has populated in her chart. I believe two patients' results got swapped - this patient, and a patient by the name of Elizabeth Palau. I called radiologist Dr. Laurence Ferrari to clarify who will fix the error in Epic.   He reports THIS PATIENT's stress test to be unremarkable. I also called family medicine to clarify that this patient's stress test was normal, not abnormal. Melina Copa PA-C

## 2013-12-07 NOTE — Progress Notes (Signed)
Lexiscan nuc completed without complication. Emanuel Campos PA-C

## 2013-12-07 NOTE — H&P (Signed)
I have seen and examined this patient. I have discussed with Dr Raeford Razor.  I agree with their findings and plans as documented in their admission note.

## 2013-12-07 NOTE — Progress Notes (Signed)
UR Completed.  [name] Jane [phone] 12/07/2013  

## 2013-12-07 NOTE — Progress Notes (Signed)
I have seen and examined this patient. I have discussed with Dr Lajuana Ripple.  I agree with their findings and plans as documented in their progress note.  Patient right leg wekaness appears to be related to pain, specifically along right Achilles tendon consistent to a tendonopathy.  No acutye fidings on Head CT.   Will start scheduled APAP and ice pack for now.

## 2013-12-07 NOTE — Consult Note (Addendum)
CARDIOLOGY CONSULT NOTE   Patient ID: [name] MRN: [id number], DOB/AGE: 1945/04/19   Admit date: 12/06/2013 Date of Consult: 12/07/2013  Primary Physician: Christa See, MD Primary Cardiologist: None  Reason for consult:  Chest pain   Problem List  Past Medical History  Diagnosis Date  . Colon cancer Nov. 27,  2006  . Hypertension   . GERD (gastroesophageal reflux disease)   . Diabetes mellitus     Type 2  . Hypercholesterolemia   . Gastritis     Past Surgical History  Procedure Laterality Date  . Right colectomy  Nov. 27, 2006  . Cardiac catheterization  2005  . Tmj arthroplasty    . Laparoscopic nissen fundoplication  0000000  . Repair prolapsedbladder    . Abdominal hysterectomy  1976     Allergies  No Known Allergies  HPI   [name] is a 68 y.o. female presenting with chest pain.  The patient was in the church yesterday, when she got up to walk to the restroom and experienced chest pain, retrosternal, pressure like. She stopped and felt some relief, then felt chest pain again on the way from the bathroom. She sat down and passed out. Her sister brought her to the ER. She states that she has had a stress test in 2006 when she was diagnosed and treated for colo cancer. She saw Dr Olevia Perches at the time. She states that she also had a cath at the time, but doesn't remember the results and if any intervention was performed. She hasn't had any cardiac problems until recently when she started to experience fatigue and DOE. ADL like cooking would make her profoundly tired and lay down in between work to get some rest. She has never smoked.  She denies any orthopnea, PND or LE edema.  Her father had unspecified heart disease and died of unknown reason at age 54. No other family members with CAD.   Inpatient Medications  . aspirin EC  81 mg Oral Daily  . carvedilol  3.125 mg Oral BID WC  . glipiZIDE  10 mg Oral QAC breakfast  . heparin  5,000 Units  Subcutaneous 3 times per day  . insulin glargine  10 Units Subcutaneous Daily  . latanoprost  1 drop Both Eyes QHS  . metoCLOPramide  5 mg Oral TID AC  . pantoprazole  20 mg Oral Daily    Family History Family History  Problem Relation Age of Onset  . Lymphoma Other   . Colon cancer Neg Hx   . Stomach cancer Neg Hx      Social History History   Social History  . Marital Status: Legally Separated    Spouse Name: N/A    Number of Children: N/A  . Years of Education: N/A   Occupational History  . Not on file.   Social History Main Topics  . Smoking status: Never Smoker   . Smokeless tobacco: Never Used  . Alcohol Use: No  . Drug Use: No  . Sexual Activity: Not on file   Other Topics Concern  . Not on file   Social History Narrative  . No narrative on file     Review of Systems  General:  No chills, fever, night sweats or weight changes.  Cardiovascular:  No chest pain, dyspnea on exertion, edema, orthopnea, palpitations, paroxysmal nocturnal dyspnea. Dermatological: No rash, lesions/masses Respiratory: No cough, dyspnea Urologic: No hematuria, dysuria Abdominal:   No nausea, vomiting, diarrhea, bright red blood  per rectum, melena, or hematemesis Neurologic:  No visual changes, wkns, changes in mental status. All other systems reviewed and are otherwise negative except as noted above.  Physical Exam  Blood pressure 133/52, pulse 52, temperature 98.5 F (36.9 C), temperature source Oral, resp. rate 16, height 5\' 4"  (1.626 m), weight 147 lb 12.8 oz (67.042 kg), SpO2 99.00%.  General: Pleasant, NAD Psych: Normal affect. Neuro: Alert and oriented X 3. Moves all extremities spontaneously. HEENT: Normal  Neck: Supple without bruits or JVD. Lungs:  Resp regular and unlabored, CTA. Heart: RRR no s3, s4, or murmurs. Abdomen: Soft, non-tender, non-distended, BS + x 4.  Extremities: No clubbing, cyanosis or edema. DP/PT/Radials 2+ and equal  bilaterally.  Labs   Recent Labs  12/06/13 2235 12/07/13 0118 12/07/13 0514  TROPONINI <0.30 <0.30 <0.30   Lab Results  Component Value Date   WBC 8.7 12/06/2013   HGB 10.2* 12/06/2013   HCT 30.0* 12/06/2013   MCV 84.4 12/06/2013   PLT 244 12/06/2013    Recent Labs Lab 12/07/13 0514  NA 138  K 4.0  CL 103  CO2 25  BUN 21  CREATININE 1.17*  CALCIUM 8.8  GLUCOSE 207*   Lab Results  Component Value Date   CHOL 144 05/27/2013   HDL 36* 05/27/2013   LDLCALC 66 05/27/2013   TRIG 212* 05/27/2013   Lab Results  Component Value Date   DDIMER  Value: <0.22        AT THE INHOUSE ESTABLISHED CUTOFF VALUE OF 0.48 ug/mL FEU, THIS ASSAY HAS BEEN DOCUMENTED IN THE LITERATURE TO HAVE 04/03/2007   Radiology/Studies  Ct Abdomen Pelvis Wo Contrast  12/02/2013   CLINICAL DATA:  Unintentional weight loss. Lower abdominal pain. Nausea and vomiting. Diarrhea. History of colon cancer status post chemotherapy completed in 2007.  IMPRESSION: 1. No explanation for the patient's symptoms is identified on today's non contrast CT examination. 2. Postoperative changes near the gastroesophageal junction suggesting prior fundoplication. 3. Status post partial right colectomy. No signs of local recurrence of disease. 4. Atherosclerosis, including at least 2 vessel coronary artery disease. Assessment for potential risk factor modification, dietary therapy or pharmacologic therapy may be warranted, if clinically indicated. 5. Additional incidental findings, as above.     Dg Chest 2 View  12/06/2013   CLINICAL DATA:  Chest pain.  EXAM: CHEST  2 VIEW  COMPARISON:  05/11/2012  FINDINGS: Low lung volumes. Heart and mediastinal contours are within normal limits. No focal opacities or effusions. No acute bony abnormality.  IMPRESSION: Low volumes.  No active cardiopulmonary disease.    Echocardiogram - 02/18/2013 Left ventricle: The cavity size was normal. There was mild focal basal hypertrophy of the septum.  Systolic function was normal. The estimated ejection fraction was in the range of 55% to 60%. Wall motion was normal; there were no regional wall motion abnormalities. Doppler parameters are consistent with abnormal left ventricular relaxation (grade 1 diastolic dysfunction). - Pulmonary arteries: Systolic pressure was mildly increased. PA peak pressure: 23mm Hg (S).  ECG: Sinus bradycardia, negative T waves in the inferior leads suggestive of ischemia   ASSESSMENT AND PLAN  68 y.o. female presenting with chest pain .   1. Chest pain with syncope, recent exercise intolerance ACS ruled out - abnormal baseline ECG suggestive of possible inferior ischemia - coronary calcifications seen on LAD and RCA of non-contrast chest CT  - we will perform Lexiscan nuclear stress test today - echo is pending - ? Prior h/o CAD,  no records from 2006 and hasn't follow with cardiology in the last 10 years - continue ASA, cavedilol, add atorvastatin  2. HTN - controlled  3. Hypokalemia - resolved   4. Hx of colon cancer: Followed by Dr. Juliann Mule. Unremarkable non-contrast abdominal CT   Signed, Dorothy Spark, MD, [facility] 12/07/2013, 10:34 AM

## 2013-12-07 NOTE — Progress Notes (Signed)
Family Medicine Teaching Service Daily Progress Note Intern Pager: [phone]  Patient name: [name] record number: [id number] Date of birth: [birth year] Age: 68 y.o. Gender: female  Primary Care Provider: Christa See, MD Consultants: none Code Status: FULL  Pt Overview and Major Events to Date:  08/29: Patient complains of Right LE weakness that began this am.  Assessment and Plan:  [name] is a 68 y.o. female presenting with chest pain . PMH is significant for CAD, HTN, HLD, anxiety/depression, GERD, T2DM, chronic vertigo   Chest pain: MI (no signs of ischemia on EKG) vs stable angina (CP with exertion) vs GERD (uses Nexium intermittently) vs costochondritis (TTP on lateral side of chest) vs anxiety. Patient has multiple risk factors including CAD (with stents placed 10 yr ago), HTN, HLD, T2DM. EKG in ED showed no ischemic changes. IStat Troponin negative. CXR showed no acute abnormalities. Last echo Grade 1 diastolic dysfxn EF 0000000  -Admit with telemetry under Dr McDiarmid  -vitals per floor protocol  -cycle troponins: negative x3  -TSH 1.830 -O2 PRN  -Coreg decreased to 3.125, HR 52. Consider decreasing on discharge, as patient's HR runs in the 50's perpetually.  -ASA 81  -Morphine PRN pain  -repeat EKG sinus bradycardia, no ischemic changes  -consider cards consult   Bradycardia: asymptomatic but appears to be at baseline in the 50-60's  - decrease coreg to 3.125 BID  Right LE weakness: Began am of 08/29. Admits to foot drag on R. -CT head w/o contrast -neuro consult if abnormal CT  Hypokalemia: K 3.2 last evening. -Mg 1.7 -60meq K given -K this morning 4.0  CAD: normal Nuclear med study performed in 2009. Reports to having stents placed 10 years ago.  - is on a daily ASA 81 mg  - ASCVD risk 26.6% and not currently on a statin, consider adding statin  - consider outpatient stress test if all labs normal   HTN: continue home Coreg  -hold  accuretic. Consider decreasing dose on discharge if BP remains soft   T2DM: BG 122 in ED. Last hgbA1c8.2  -Continue home Lantus 10u qHS and Glucotrol qam  -CBGs QID   GERD: On nexium at home. Protonix in hospital   Hx of colon cancer: Followed by Dr. Juliann Mule. 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 The patient underwent laparoscopic assisted right colectomy by Dr. Jackolyn Confer on 03/07/2005. Tumor stage was T3 N0 IIA. The patient received adjuvant chemotherapy by Dr. Jerold Coombe Odogwu consisting of 4 cycles of FOLFOX and 8 cycles of chemotherapy with 5 fluorouracil leucovorin and 5FU by continuous infusion from 04/27/2005 through 10/13/2005.  - per last visit, The patient remains without evidence of disease.   Anxiety/Depressive disorder: is not currently taking any medications but mood appears to be stable.   Meningioma: sees Dr. Jovita Gamma and is supposed to be having yearly imaging studies. She did have an MRI of the brain with and without IV contrast on 09/23/2010. 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 This was only a mm larger than seen previously on the MRI of 04/09/2009.   FEN/GI: Protonix, NPO (possible stress test/echo)  Prophylaxis: Heparin sub-q   Disposition: Discharge pending cardiac r/o and CT head results.  Subjective:  Patient reports resolution of chest pain but states that her chest still feels funny on the Left side.  She admits to mild headache, nausea and weakness with ambulation that  began this morning.  She states that she "drags her right foot".  Denies changes in sensation.  Objective: Temp:  [98.5 F (36.9 C)-99.2 F (37.3 C)] 98.5 F (36.9 C) (08/29 0500) Pulse Rate:  [49-65] 52 (08/29 0500) Resp:  [11-20] 16 (08/29 0500) BP: (91-166)/(39-102) 133/52 mmHg (08/29 0500) SpO2:  [97 %-100 %] 99 % (08/29 0500) Weight:  [145 lb (65.772 kg)-147 lb  12.8 oz (67.042 kg)] 147 lb 12.8 oz (67.042 kg) (08/29 0052) Physical Exam: General: elderly female resting in bed on her side, NAD EENT: [place]/AT, poor dentition, EOMI Cardiovascular: S1S2, RRR, no murmurs Respiratory: globally decreased breath sounds, no increased work of breathing Abdomen: obese, soft, NT/ND, +BS Extremities: WWP,  4/5 b/l LE strength Left stronger than R, DTRs 1/4 LE, sensation in tact but better felt L compared to R Neuro: CN2-12 grossly in tact, sensation grossly in tact, gait not assessed.  Laboratory:  Recent Labs Lab 12/04/13 1158 12/06/13 1935 12/06/13 2244  WBC 7.5 8.7  --   HGB 10.7* 10.5* 10.2*  HCT 32.5* 30.9* 30.0*  PLT 222 244  --     Recent Labs Lab 12/06/13 1935 12/06/13 2244 12/07/13 0514  NA 141 139 138  K 3.4* 3.2* 4.0  CL 102 102 103  CO2 27  --  25  BUN 22 20 21   CREATININE 1.21* 1.10 1.17*  CALCIUM 9.4  --  8.8  GLUCOSE 189* 122* 207*   Mg: 1.74  Imaging/Diagnostic Tests: Dg Chest 2 View  12/06/2013   CLINICAL DATA:  Chest pain.  EXAM: CHEST  2 VIEW  COMPARISON:  05/11/2012  FINDINGS: Low lung volumes. Heart and mediastinal contours are within normal limits. No focal opacities or effusions. No acute bony abnormality.  IMPRESSION: Low volumes.  No active cardiopulmonary disease.   Electronically Signed   By: Rolm Baptise M.D.   On: 12/06/2013 20:43     Janora Norlander, DO 12/07/2013, 9:54 AM PGY-1, [place] Intern pager: [phone], text pages welcome

## 2013-12-07 NOTE — ED Notes (Signed)
Report attempted 

## 2013-12-07 NOTE — ED Provider Notes (Signed)
I have personally seen and examined the patient.  I have discussed the plan of care with the resident.  I have reviewed the documentation on PMH/FH/Soc. History.  I have reviewed the documentation of the resident and agree.  I have reviewed and agree with the ECG interpretation(s) documented by the resident.   Pt well appearing, feeling improved but would benefit from admission for evaluation of her CP   Sharyon Cable, MD 12/07/13 [phone]

## 2013-12-08 DIAGNOSIS — R0789 Other chest pain: Secondary | ICD-10-CM | POA: Diagnosis not present

## 2013-12-08 DIAGNOSIS — R29898 Other symptoms and signs involving the musculoskeletal system: Secondary | ICD-10-CM

## 2013-12-08 DIAGNOSIS — R001 Bradycardia, unspecified: Secondary | ICD-10-CM

## 2013-12-08 LAB — GLUCOSE, CAPILLARY
Glucose-Capillary: 164 mg/dL — ABNORMAL HIGH (ref 70–99)
Glucose-Capillary: 227 mg/dL — ABNORMAL HIGH (ref 70–99)

## 2013-12-08 MED ORDER — ATORVASTATIN CALCIUM 40 MG PO TABS: 40.0000 mg | ORAL_TABLET | Freq: Every day | ORAL | Status: DC

## 2013-12-08 MED ORDER — CARVEDILOL 3.125 MG PO TABS: 3.1250 mg | ORAL_TABLET | Freq: Two times a day (BID) | ORAL | Status: DC

## 2013-12-08 NOTE — Progress Notes (Signed)
Utilization Review Completed.   Tadeo Besecker, RN, BSN Nurse Case Manager  

## 2013-12-08 NOTE — Discharge Summary (Signed)
I have discussed with Dr Lajuana Ripple.  I agree with their plans as documented in their discharge note.

## 2013-12-08 NOTE — Discharge Instructions (Addendum)
[name], we are so glad to see that you are feeling better.  You were admitted with atypical chest pain.  Cardiac etiology was ruled out.  You are being discharged with a new strength of Carvedilol (3.125) to be taken twice daily instead of your old dose of 12.5.  Also, you are being discharged with a new medication called Atorvastatin 40mg .  This is a medication indicated for protection of your heart.  Please remember to go to your hospital follow up visit with Dr Gerarda Fraction on September 11 at [place] at the [facility].  Also, please plan to schedule an appointment with a cardiologist for follow up of your cardiac artery disease.  Thank you for allowing [place] to care for you at [facility]!   Chest Pain (Nonspecific) It is often hard to give a diagnosis for the cause of chest pain. There is always a chance that your pain could be related to something serious, such as a heart attack or a blood clot in the lungs. You need to follow up with your doctor. HOME CARE  If antibiotic medicine was given, take it as directed by your doctor. Finish the medicine even if you start to feel better.  For the next few days, avoid activities that bring on chest pain. Continue physical activities as told by your doctor.  Do not use any tobacco products. This includes cigarettes, chewing tobacco, and e-cigarettes.  Avoid drinking alcohol.  Only take medicine as told by your doctor.  Follow your doctor's suggestions for more testing if your chest pain does not go away.  Keep all doctor visits you made. GET HELP IF:  Your chest pain does not go away, even after treatment.  You have a rash with blisters on your chest.  You have a fever. GET HELP RIGHT AWAY IF:   You have more pain or pain that spreads to your arm, neck, jaw, back, or belly (abdomen).  You have shortness of breath.  You cough more than usual or cough up blood.  You have very bad back or belly pain.  You feel sick to your  stomach (nauseous) or throw up (vomit).  You have very bad weakness.  You pass out (faint).  You have chills. This is an emergency. Do not wait to see if the problems will go away. Call your local emergency services (911 in [place] Do not drive yourself to the hospital. MAKE SURE YOU:   Understand these instructions.  Will watch your condition.  Will get help right away if you are not doing well or get worse. Document Released: 09/14/2007 Document Revised: 04/02/2013 Document Reviewed: 09/14/2007 [facility] Patient Information 2015 [place], [place]. This information is not intended to replace advice given to you by your health care provider. Make sure you discuss any questions you have with your health care provider.

## 2013-12-08 NOTE — Discharge Summary (Signed)
[facility] Discharge Summary  Patient name: [name] record number: [id number] Date of birth: 11/08/1945 Age: 68 y.o. Gender: female Date of Admission: 12/06/2013  Date of Discharge: 12/08/13 Admitting Physician: Anne Ohara McDiarmid, MD  Primary Care Provider: Christa See, MD Consultants: Cardiology  Indication for Hospitalization: atypical chest pain  Discharge Diagnoses/Problem List:  Atypical chest pain: resolved T2DM HTN CAD GERD Hypokalemia Weakness of R foot  Disposition: Discharge home.  Discharge Condition: stable  Discharge Exam:  Gen: elderly AA female, resting in bed, NAD HEENT: AT/[place], poor dentition, mildly dry MM, EOMI Cardio: S1S2, bradycardic, regular rhythm, no murmurs Pulm: CTAB, no wheeze/rhonchi/rales Abd: soft, NT/ND, +BS Ext: thin, WWP, no edema, +2 pedal pulses Neuro: CN 2-12 grossly in tact, gait not assessed, moves extremities spontaneously  Brief Hospital Course:  [name] is a 68 y.o. female presenting with atypical chest pain . PMH is significant for CAD with stents, HTN, HLD, anxiety/depression, GERD, T2DM, chronic vertigo  Patient was admitted to telemetry and vitals were monitored per floor protocol.  On admission EKG, CXR and troponins were obtained.  EKG demonstrated no ischemic changes and CXR showed no acute abnormalities.  Troponins were negative x4.   Patient HR was noted to be in the 50's.  Therefore, her home Coreg of 12.5 BID was decreased to 3.125 BID.  Patient tolerated this well.  Patient was seen by cardiology and a nuclear stress test was performed, which was unremarkable. (Of note, there was a mis-documentation of patient's stress test by Radiology, indicating that she had an emergent catheterization.  These results were for another patient.  Records are being amended by Radiologist accordingly.) Because patient's ASCVD score was 26.6%, patient was discharged on Lipitor  40.  Patient was found to be hypokalemic on admission with a K of 3.4.  She was treated with [facility] and K remained stable throughout the rest of her stay.  K on discharge was 4.0.  Patient reported new weakness in her R ankle/foot during hospital stay.  She was examined and no overt neurological deficits were appreciated but mild weakness of the R LE compared to L LE was noted.  In addition, patient reported gait instability on admission, so a CT head was ordered.  CT head was unremarkable for any acute processes.  Patient's CBGs were monitored during stay.  Patient's home medications were continued.     Issues for Follow Up:   Foot weakness likely a tendonitis, but consider nerve conduction testing on R foot if weakness does not improve  Recommend rechecking K level at f/u visit  Please re-evaluate patient's HR.  Patient was previously on Coreg 12.5 and was decreased to 3.125 during hospitalization.  She will be discharged with the lower dose.  Please adjust accordingly, if needed.  Consider repeat LFTs, as patient was discharged on a high dose statin (ASCVD 26.6%)  Significant Procedures: none  Significant Labs and Imaging:   Recent Labs Lab 12/04/13 1158 12/06/13 1935 12/06/13 2244  WBC 7.5 8.7  --   HGB 10.7* 10.5* 10.2*  HCT 32.5* 30.9* 30.0*  PLT 222 244  --     Recent Labs Lab 12/06/13 1935 12/06/13 2244 12/07/13 0118 12/07/13 0514  NA 141 139  --  138  K 3.4* 3.2*  --  4.0  CL 102 102  --  103  CO2 27  --   --  25  GLUCOSE 189* 122*  --  207*  BUN 22 20  --  21  CREATININE 1.21* 1.10  --  1.17*  CALCIUM 9.4  --   --  8.8  MG  --   --  1.7  --     Dg Chest 2 View  12/06/2013   CLINICAL DATA:  Chest pain.  EXAM: CHEST  2 VIEW  COMPARISON:  05/11/2012  FINDINGS: Low lung volumes. Heart and mediastinal contours are within normal limits. No focal opacities or effusions. No acute bony abnormality.  IMPRESSION: Low volumes.  No active cardiopulmonary disease.    Electronically Signed   By: Rolm Baptise M.D.   On: 12/06/2013 20:43   Ct Head Wo Contrast  12/07/2013   CLINICAL DATA:  Right-sided lower extremity weakness. History of hypertension.  EXAM: CT HEAD WITHOUT CONTRAST  TECHNIQUE: Contiguous axial images were obtained from the base of the skull through the vertex without intravenous contrast.  COMPARISON:  Brain MRI 10/19/2012.  Head CT 03/10/2010.  FINDINGS: Patchy and confluent areas of decreased attenuation are noted throughout the deep and periventricular white matter of the cerebral hemispheres bilaterally, compatible with chronic microvascular ischemic disease. 9 mm high attenuation lesion associated with the right side of the anterior falx cerebri, compatible with a meningioma (unchanged). No acute intracranial abnormalities. Specifically, no evidence of acute intracranial hemorrhage, no definite findings of acute/subacute cerebral ischemia, no new mass, mass effect, hydrocephalus or abnormal intra or extra-axial fluid collections. Visualized paranasal sinuses and mastoids are well pneumatized. No acute displaced skull fractures are identified.  IMPRESSION: 1. No acute intracranial abnormalities. 2. Small right frontal parafalcine meningioma similar to prior examinations. 3. Mild chronic microvascular ischemic changes in the cerebral white matter redemonstrated.   Electronically Signed   By: Vinnie Langton M.D.   On: 12/07/2013 11:08   Nm Myocar Multi W/spect W/wall Motion / Ef  12/08/2013   ADDENDUM REPORT: 12/08/2013 07:41  ADDENDUM: Clerical error. The initial report is incorrect. Please use the following report which correctly describes the findings and impression for the nuclear medicine cardiac stress test performed on [name] date of birth October 05, 1945.  CLINICAL DATA: 68 year old female with chest pain  EXAM:  MYOCARDIAL IMAGING WITH SPECT (REST AND PHARMACOLOGIC-STRESS)  GATED LEFT VENTRICULAR WALL MOTION STUDY  LEFT VENTRICULAR EJECTION  FRACTION  TECHNIQUE: Standard myocardial SPECT imaging was performed after resting intravenous injection of 10 mCi Tc-66m sestamibi. Subsequently, intravenous infusion of Lexiscan was performed under the supervision of the Cardiology staff. At peak effect of the drug, 30 mCi Tc-65m sestamibi was injected intravenously and standard myocardial SPECT imaging was performed. Quantitative gated imaging was also performed to evaluate left ventricular wall motion, and estimate left ventricular ejection fraction.  COMPARISON: Prior chest x-ray 12/06/2013  FINDINGS: Perfusion: No decreased activity in the left ventricle on stress imaging to suggest reversible ischemia or infarction. Small focal defect in the ventricular septum at the apex on both the stress and post pharmacological stress images.  Wall Motion: Normal left ventricular wall motion. No left ventricular dilation.  Left Ventricular Ejection Fraction: 82 %  End diastolic volume 56 ml  End systolic volume 10 ml  IMPRESSION:  1. No reversible ischemia or infarction.  2. Normal left ventricular wall motion.  3. Left ventricular ejection fraction 82%. This is likely exaggerated secondary to small left ventricular size.  4. Low-risk stress test findings*.  5. Small fixed septal defect at the apex may represent breast attenuation artifact or a small focus of remote infarct/scarring.  *2012 Appropriate Use Criteria for Coronary Revascularization Focused Update:  J Am Coll Cardiol. N6492421. http://content.airportbarriers.com.aspx?articleid=1201161   Electronically Signed   By: Jacqulynn Cadet M.D.   On: 12/08/2013 07:41   12/08/2013   CLINICAL DATA:  68 year old female with chest pain. The patient became symptomatic during exercise prior to achieving her maximal heart rate. She was injected for the post exercise portion of the study. However, her symptoms escalated and she was taken for cardiac catheterization the for post stress imaging could be obtained.   EXAM: MYOCARDIAL IMAGING WITH SPECT (REST)  TECHNIQUE: Standard myocardial SPECT imaging was performed after resting intravenous injection of 10 mCi Tc-25m sestamibi. Subsequently, exercise tolerance test was performed by the patient under the supervision of the Cardiology staff. At peak-stress, 30 mCi Tc-48m sestamibi was injected intravenously and standard myocardial SPECT imaging was performed. Quantitative gated imaging was also performed to evaluate left ventricular wall motion, and estimate left ventricular ejection fraction.  COMPARISON:  Chest x-ray 12/06/2013  FINDINGS: Perfusion: Decreased radiotracer uptake in the septal wall in the mid and apical ventricle.  IMPRESSION: Incomplete study secondary to progressive cardiac symptoms following exercise necessitating urgent cardiac catheterization.  There was a defect in radiotracer uptake in the mid and apical ventricular septum on the initial resting images. This may represent a region of prior infarct/scarring or breast attenuation artifact.  Electronically Signed: By: Jacqulynn Cadet M.D. On: 12/07/2013 15:11     Results/Tests Pending at Time of Discharge: none  Discharge Medications:    Medication List         aspirin 81 MG chewable tablet  Chew 81 mg by mouth daily.     atorvastatin 40 MG tablet  Commonly known as:  LIPITOR  Take 1 tablet (40 mg total) by mouth daily.     carvedilol 3.125 MG tablet  Commonly known as:  COREG  Take 1 tablet (3.125 mg total) by mouth 2 (two) times daily with a meal.     esomeprazole 40 MG packet  Commonly known as:  NEXIUM  Take 40 mg by mouth daily before breakfast.     glipiZIDE 10 MG tablet  Commonly known as:  GLUCOTROL  Take 1 tablet (10 mg total) by mouth daily before breakfast.     glucose blood test strip  Check sugars three times daily     ibuprofen 200 MG tablet  Commonly known as:  ADVIL,MOTRIN  Take 600 mg by mouth every 4 (four) hours as needed for mild pain.     insulin  glargine 100 UNIT/ML injection  Commonly known as:  LANTUS  Inject 10 Units into the skin daily. Dispense 1 month supply     metoCLOPramide 5 MG tablet  Commonly known as:  REGLAN  Take 1 tablet (5 mg total) by mouth 3 (three) times daily before meals.     nitroGLYCERIN 0.4 MG SL tablet  Commonly known as:  NITROSTAT  Place 1 tablet (0.4 mg total) under the tongue every 5 (five) minutes as needed. If chest pain not relieved by third tab call 911.     ondansetron 8 MG disintegrating tablet  Commonly known as:  ZOFRAN-ODT  Take 1 tablet (8 mg total) by mouth every 8 (eight) hours as needed for nausea.     quinapril-hydrochlorothiazide 20-12.5 MG per tablet  Commonly known as:  ACCURETIC  Take 2 tablets by mouth daily.     Travoprost (BAK Free) 0.004 % Soln ophthalmic solution  Commonly known as:  TRAVATAN  Place 1 drop into both eyes at bedtime.  Discharge Instructions: Please refer to Patient Instructions section of EMR for full details.  Patient was counseled important signs and symptoms that should prompt return to medical care, changes in medications, dietary instructions, activity restrictions, and follow up appointments.   Follow-Up Appointments: Follow-up Information   Follow up with Diona Fanti, DO On 12/20/2013. (9:00 am   (hospital follow up))    Specialty:  Family Medicine   Contact information:   [id number] [place] [address] [place], DO 12/08/2013, 9:00 AM PGY-1, [place]

## 2013-12-18 ENCOUNTER — Telehealth: Payer: Self-pay | Admitting: Home Health Services

## 2013-12-18 DIAGNOSIS — IMO0002 Reserved for concepts with insufficient information to code with codable children: Secondary | ICD-10-CM

## 2013-12-18 DIAGNOSIS — E1165 Type 2 diabetes mellitus with hyperglycemia: Secondary | ICD-10-CM

## 2013-12-18 MED ORDER — GLIPIZIDE 10 MG PO TABS: 10.0000 mg | ORAL_TABLET | Freq: Every day | ORAL | Status: DC

## 2013-12-18 NOTE — Telephone Encounter (Signed)
[name] ([birth year]) a.       Patient had hospital stay due to chest pains (8/28-8/30) b.      Patient stated that meds were changed during admission                                                                i.      Atorvastatin decreased to 40 mg daily                                                              ii.      Carvedilol decreased (dose has been decreased over the last several months) c.       Stated that glipizide dose was decreased from twice daily to once daily by provider due to adverse effects                                                                i.      Please confirm this change and provide new Rx to pharmacy- please provide note to pharmacy to d/c all previous prescriptions d.      Patient stated she is still experiencing chest pains and says she has Rx for nitroglycerin however when she uses it she has a horrible headache (common side effect)                                                               i.      Did not know what her actually diagnosis was in the hospital                                                             ii.      Is she a candidate for other meds to help with her chest pain? e.      Can you provide 90-day Rxs to her pharmacy?

## 2013-12-18 NOTE — Telephone Encounter (Signed)
Red Team: Please call [name] back.  1. I sent in a prescription for glipizide 10 mg once a day (90 tablets, which is a 74-month supply, with 1 refill).  2. Please ask her what other medications she needs sent in for a 90 day supply, as she requested in the note above by. [name].  3. In reference to her chest pain, she was seen in the hospital and her "diagnosis" was chest pain that was probably not due to her heart. She should, however, follow up with cardiology. She was seen in the hospital by Dr. Meda Coffee and can call [facility] at [phone] (the [facility] office) for hospital follow-up and to discuss her chest pain. There may be other medications that can help, but they will be better able to help her with that, especially if she is having ongoing chest pain.  4. She is scheduled to see Dr. Gerarda Fraction on 9/11. She can discuss any related issues at that time.  Dr. Gerarda Fraction: Juluis Rainier to you. Thank you for seeing [name] on 9/11. --CMS

## 2013-12-18 NOTE — Addendum Note (Signed)
Addended by: Emmaline Kluver on: 12/18/2013 11:31 AM   Modules accepted: Orders

## 2013-12-20 ENCOUNTER — Ambulatory Visit (INDEPENDENT_AMBULATORY_CARE_PROVIDER_SITE_OTHER): Payer: Medicare Other | Admitting: Obstetrics and Gynecology

## 2013-12-20 ENCOUNTER — Encounter: Payer: Self-pay | Admitting: Obstetrics and Gynecology

## 2013-12-20 VITALS — BP 158/54 | HR 61 | Temp 98.3°F | Wt 145.0 lb

## 2013-12-20 DIAGNOSIS — R0789 Other chest pain: Secondary | ICD-10-CM | POA: Insufficient documentation

## 2013-12-20 DIAGNOSIS — R079 Chest pain, unspecified: Secondary | ICD-10-CM

## 2013-12-20 DIAGNOSIS — Z23 Encounter for immunization: Secondary | ICD-10-CM

## 2013-12-20 DIAGNOSIS — I10 Essential (primary) hypertension: Secondary | ICD-10-CM

## 2013-12-20 LAB — COMPREHENSIVE METABOLIC PANEL
ALT: 11 U/L (ref 0–35)
AST: 13 U/L (ref 0–37)
Albumin: 4 g/dL (ref 3.5–5.2)
Alkaline Phosphatase: 71 U/L (ref 39–117)
BUN: 27 mg/dL — ABNORMAL HIGH (ref 6–23)
CO2: 25 mEq/L (ref 19–32)
Calcium: 9.7 mg/dL (ref 8.4–10.5)
Chloride: 101 mEq/L (ref 96–112)
Creat: 1.16 mg/dL — ABNORMAL HIGH (ref 0.50–1.10)
Glucose, Bld: 184 mg/dL — ABNORMAL HIGH (ref 70–99)
Potassium: 4 mEq/L (ref 3.5–5.3)
Sodium: 137 mEq/L (ref 135–145)
Total Bilirubin: 0.4 mg/dL (ref 0.2–1.2)
Total Protein: 6.9 g/dL (ref 6.0–8.3)

## 2013-12-20 NOTE — Progress Notes (Signed)
Patient ID: [name], female   DOB: October 07, 1945, 68 y.o.   MRN: [id number]      Subjective:  Chief Complaint  Patient presents with  . HFU    HPI: [name] is a 68 y.o. presenting to clinic today for a hospital follow-up:  Patient was admitted to the hospital for chest pain; troponins and stress tests were all negative. Patient still complaining of chest pain she describes it as a sharp pain that occurs almost daily. It lasts for about hour. She does not do anything for the pain most of the time but sometimes takes an ibuprofen. There is no associated radiation. She is able to pinpoint area of the chest pain and says sometimes it is "sore".   ROS per HPI. Otherwise negative.    Objective: BP 158/54  Pulse 61  Temp(Src) 98.3 F (36.8 C) (Oral)  Wt 145 lb (65.772 kg)  General: alert, well-developed, NAD, cooperative, A&Ox3 Lungs: CTAB, normal respiratory effort, no accessory muscle use, no crackles, and no wheezes. Heart: Bradycardic, regular rhythm  , no M/R/G. No lower extremity edema. Pulses: 2+ DP/PT pulses bilaterally  Assessment/Plan: Please see problem based Assessment and Plan.   Luiz Blare, DO 12/20/2013, 9:28 AM PGY-1, [place]

## 2013-12-20 NOTE — Assessment & Plan Note (Signed)
A: Chest pain sounds MSK in nature vs psychological stress related.  P: Repeat CMET was ordered today to follow her kidney function, LFTs, and K levels. Referral to Cardiologist who saw patient in hospital will be made. She was seen in the hospital by Dr. Meda Coffee of [facility].  Patient adivsied to continue using Ibuprofen or Tylenol as needed for the discomfort at this time.

## 2013-12-20 NOTE — Patient Instructions (Signed)
Ms. Verhaeghe it was great to meet you today!  I am pleased to hear that you have been doing fairly well since the hospital.   Here are some of the things we discussed today: -Please schedule an appointment with Dr. Venetia Maxon to discuss some of your other concerns.  -You received the flu vaccine today -I will make a referral to the Cardiologist for your continued chest pain.   Thanks for allowing me to be a part of your care! Dr. Gerarda Fraction

## 2013-12-20 NOTE — Assessment & Plan Note (Signed)
A: Chest pain sounds MSK in nature vs psychological stress related.  P:  Repeat CMET was ordered today to follow her kidney function, LFTs, and K levels. Referral to Cardiologist who saw patient in hospital will be made. She was seen in the hospital by Dr. Meda Coffee of [facility].  Patient adivsied to continue using Ibuprofen or Tylenol as needed for the discomfort at this time.  Asked patient to make another appointment to see her PCP for follow-up on this and some of her other concerns.

## 2013-12-20 NOTE — Telephone Encounter (Signed)
Informed patient of message below during appointment today.

## 2014-02-11 ENCOUNTER — Telehealth: Payer: Self-pay | Admitting: Hematology

## 2014-02-11 NOTE — Telephone Encounter (Signed)
S/w pt advised appt chg on 05/16/14 from 1pm to 2pm due to md meeting. Pt verbalized understanding. I've also mailed a revised appt calendar.

## 2014-04-28 ENCOUNTER — Telehealth: Payer: Self-pay | Admitting: Oncology

## 2014-04-28 NOTE — Telephone Encounter (Signed)
S/w pt's spouse confirming labs/ov r/s from 02/04 time change, mailed out updated sch.... Cherylann Banas

## 2014-04-29 ENCOUNTER — Telehealth: Payer: Self-pay | Admitting: Oncology

## 2014-04-29 NOTE — Telephone Encounter (Signed)
, °

## 2014-05-05 ENCOUNTER — Ambulatory Visit: Payer: Medicaid Other | Admitting: Family Medicine

## 2014-05-14 ENCOUNTER — Other Ambulatory Visit: Payer: Self-pay | Admitting: *Deleted

## 2014-05-14 DIAGNOSIS — Z85038 Personal history of other malignant neoplasm of large intestine: Secondary | ICD-10-CM

## 2014-05-15 ENCOUNTER — Ambulatory Visit (HOSPITAL_BASED_OUTPATIENT_CLINIC_OR_DEPARTMENT_OTHER): Payer: Medicare Other | Admitting: Oncology

## 2014-05-15 ENCOUNTER — Telehealth: Payer: Self-pay | Admitting: Oncology

## 2014-05-15 ENCOUNTER — Other Ambulatory Visit (HOSPITAL_BASED_OUTPATIENT_CLINIC_OR_DEPARTMENT_OTHER): Payer: Medicare Other

## 2014-05-15 VITALS — BP 170/63 | HR 46 | Temp 98.3°F | Resp 18 | Ht 64.0 in | Wt 149.3 lb

## 2014-05-15 DIAGNOSIS — Z85038 Personal history of other malignant neoplasm of large intestine: Secondary | ICD-10-CM

## 2014-05-15 DIAGNOSIS — E118 Type 2 diabetes mellitus with unspecified complications: Secondary | ICD-10-CM | POA: Diagnosis not present

## 2014-05-15 DIAGNOSIS — I10 Essential (primary) hypertension: Secondary | ICD-10-CM

## 2014-05-15 LAB — COMPREHENSIVE METABOLIC PANEL (CC13)
ALT: 9 U/L (ref 0–55)
AST: 13 U/L (ref 5–34)
Albumin: 3.7 g/dL (ref 3.5–5.0)
Alkaline Phosphatase: 94 U/L (ref 40–150)
Anion Gap: 11 mEq/L (ref 3–11)
BUN: 17.3 mg/dL (ref 7.0–26.0)
CO2: 24 mEq/L (ref 22–29)
Calcium: 9.6 mg/dL (ref 8.4–10.4)
Chloride: 108 mEq/L (ref 98–109)
Creatinine: 1 mg/dL (ref 0.6–1.1)
EGFR: 66 mL/min/{1.73_m2} — ABNORMAL LOW (ref >90)
Glucose: 143 mg/dl — ABNORMAL HIGH (ref 70–140)
Potassium: 3.6 mEq/L (ref 3.5–5.1)
Sodium: 143 mEq/L (ref 136–145)
Total Bilirubin: 0.68 mg/dL (ref 0.20–1.20)
Total Protein: 7 g/dL (ref 6.4–8.3)

## 2014-05-15 LAB — CBC WITH DIFFERENTIAL/PLATELET
BASO%: 0.5 % (ref 0.0–2.0)
Basophils Absolute: 0 10*3/uL (ref 0.0–0.1)
EOS%: 1.8 % (ref 0.0–7.0)
Eosinophils Absolute: 0.1 10*3/uL (ref 0.0–0.5)
HCT: 36.8 % (ref 34.8–46.6)
HGB: 12.1 g/dL (ref 11.6–15.9)
LYMPH%: 37.4 % (ref 14.0–49.7)
MCH: 28.5 pg (ref 25.1–34.0)
MCHC: 32.9 g/dL (ref 31.5–36.0)
MCV: 86.6 fL (ref 79.5–101.0)
MONO#: 0.6 10*3/uL (ref 0.1–0.9)
MONO%: 8 % (ref 0.0–14.0)
NEUT#: 4.2 10*3/uL (ref 1.5–6.5)
NEUT%: 52.3 % (ref 38.4–76.8)
Platelets: 166 10*3/uL (ref 145–400)
RBC: 4.25 10*6/uL (ref 3.70–5.45)
RDW: 13.4 % (ref 11.2–14.5)
WBC: 8 10*3/uL (ref 3.9–10.3)
lymph#: 3 10*3/uL (ref 0.9–3.3)
nRBC: 0 % (ref 0–0)

## 2014-05-15 NOTE — Progress Notes (Signed)
  [place] OFFICE PROGRESS NOTE   Diagnosis: Colon cancer  INTERVAL HISTORY:   She returns as scheduled. She was diagnosed with colon cancer in 2006. She was most recent seen at the [facility] in August 2015. She reports chronic urinary incontinence and is followed by urology. She has an increased right frontal headache recently and plans to schedule an appointment with Dr. Sherwood Gambler. She continues colonoscopy follow-up with Dr. Carlean Purl. She  reports pruritus at the previous left chest Port-A-Cath site. Objective:  Vital signs in last 24 hours:  Blood pressure 170/63, pulse 46, temperature 98.3 F (36.8 C), temperature source Oral, resp. rate 18, height 5\' 4"  (1.626 m), weight 149 lb 4.8 oz (67.722 kg), SpO2 100 %.manual pulse 48-52     HEENT: Right frontal scalp without mass, mild tenderness, neck without mass Lymphatics: No cervical, supra lavicular, axillary, or inguinal nodes Resp: Lungs clear bilaterally Cardio: Regular rhythm and rhythm GI: No hepatomegaly, nontender, no mass Vascular: No leg edema Skin: Left upper chest Port-A-Cath site is unremarkable    Lab Results:  Lab Results  Component Value Date   WBC 8.0 05/15/2014   HGB 12.1 05/15/2014   HCT 36.8 05/15/2014   MCV 86.6 05/15/2014   PLT 166 05/15/2014   NEUTROABS 4.2 05/15/2014    Lab Results  Component Value Date   CEA 1.0 11/27/2013   Medications: I have reviewed the patient's current medications.  Assessment/Plan:  1. Colon cancer-stage II (T3 N0), status post a right colectomy 03/07/2005. She received adjuvant FOLFOX for 4 cycles followed by 8 cycles of 5-FU/leucovorin  2. History of meningioma-followed by Dr. Sherwood Gambler  3. Hypertension  4. Diabetes  Disposition:  Ms. Plazola remains in clinical remission from colon cancer. She is now almost 10 years out from diagnosis. She has an excellent prognosis for a long-term disease-free survival. She plans to continue colonoscopy  follow-up with Dr.Gessner.I discharged her from the Oncology clinic. We are available to see her in the future as needed.  She will schedule an appointment with Dr. Sherwood Gambler to evaluate the headache and for follow-up imaging of the meningioma.  Betsy Coder, MD  05/15/2014  1:00 PM

## 2014-05-15 NOTE — Telephone Encounter (Signed)
Gave pt AVS, per 02/04 POF, f/u TBA..... KJ

## 2014-05-16 ENCOUNTER — Ambulatory Visit: Payer: Medicare Other

## 2014-05-16 ENCOUNTER — Other Ambulatory Visit: Payer: Medicare Other

## 2014-05-16 LAB — CEA: CEA: 1.2 ng/mL (ref 0.0–5.0)

## 2014-07-16 ENCOUNTER — Encounter: Payer: Self-pay | Admitting: *Deleted

## 2014-07-16 ENCOUNTER — Ambulatory Visit (INDEPENDENT_AMBULATORY_CARE_PROVIDER_SITE_OTHER): Payer: Medicare Other | Admitting: Family Medicine

## 2014-07-16 ENCOUNTER — Encounter: Payer: Self-pay | Admitting: Family Medicine

## 2014-07-16 VITALS — BP 188/73 | HR 60 | Temp 97.9°F | Wt 152.0 lb

## 2014-07-16 DIAGNOSIS — L84 Corns and callosities: Secondary | ICD-10-CM

## 2014-07-16 DIAGNOSIS — F411 Generalized anxiety disorder: Secondary | ICD-10-CM | POA: Diagnosis not present

## 2014-07-16 DIAGNOSIS — E1165 Type 2 diabetes mellitus with hyperglycemia: Secondary | ICD-10-CM | POA: Diagnosis present

## 2014-07-16 DIAGNOSIS — E781 Pure hyperglyceridemia: Secondary | ICD-10-CM | POA: Diagnosis not present

## 2014-07-16 DIAGNOSIS — I10 Essential (primary) hypertension: Secondary | ICD-10-CM | POA: Diagnosis not present

## 2014-07-16 DIAGNOSIS — M79671 Pain in right foot: Secondary | ICD-10-CM | POA: Diagnosis not present

## 2014-07-16 DIAGNOSIS — IMO0002 Reserved for concepts with insufficient information to code with codable children: Secondary | ICD-10-CM

## 2014-07-16 LAB — POCT GLYCOSYLATED HEMOGLOBIN (HGB A1C): Hemoglobin A1C: 8.1

## 2014-07-16 MED ORDER — NITROGLYCERIN 0.4 MG SL SUBL: 0.4000 mg | SUBLINGUAL_TABLET | SUBLINGUAL | Status: DC | PRN

## 2014-07-16 MED ORDER — ATORVASTATIN CALCIUM 40 MG PO TABS: 40.0000 mg | ORAL_TABLET | Freq: Every day | ORAL | Status: DC

## 2014-07-16 MED ORDER — TRIAMCINOLONE ACETONIDE 0.1 % EX OINT: 1.0000 "application " | TOPICAL_OINTMENT | Freq: Two times a day (BID) | CUTANEOUS | Status: DC

## 2014-07-16 MED ORDER — GLUCOSE BLOOD VI STRP: ORAL_STRIP | Status: DC

## 2014-07-16 MED ORDER — LEVOCETIRIZINE DIHYDROCHLORIDE 5 MG PO TABS: 5.0000 mg | ORAL_TABLET | Freq: Every evening | ORAL | Status: DC

## 2014-07-16 MED ORDER — METOCLOPRAMIDE HCL 5 MG PO TABS: 5.0000 mg | ORAL_TABLET | Freq: Three times a day (TID) | ORAL | Status: DC

## 2014-07-16 MED ORDER — DIAZEPAM 5 MG PO TABS: 5.0000 mg | ORAL_TABLET | Freq: Three times a day (TID) | ORAL | Status: DC | PRN

## 2014-07-16 MED ORDER — ZOSTER VACCINE LIVE 19400 UNT/0.65ML ~~LOC~~ SOLR: 0.6500 mL | Freq: Once | SUBCUTANEOUS | Status: DC

## 2014-07-16 MED ORDER — CARVEDILOL 12.5 MG PO TABS: 12.5000 mg | ORAL_TABLET | Freq: Two times a day (BID) | ORAL | Status: DC

## 2014-07-16 MED ORDER — DESLORATADINE 5 MG PO TABS: 5.0000 mg | ORAL_TABLET | Freq: Every day | ORAL | Status: DC

## 2014-07-16 MED ORDER — GLIPIZIDE 10 MG PO TABS: 10.0000 mg | ORAL_TABLET | Freq: Every day | ORAL | Status: DC

## 2014-07-16 MED ORDER — INSULIN GLARGINE 100 UNIT/ML ~~LOC~~ SOLN: 16.0000 [IU] | Freq: Every day | SUBCUTANEOUS | Status: DC

## 2014-07-16 MED ORDER — QUINAPRIL-HYDROCHLOROTHIAZIDE 20-12.5 MG PO TABS: 2.0000 | ORAL_TABLET | Freq: Every day | ORAL | Status: DC

## 2014-07-16 NOTE — Assessment & Plan Note (Addendum)
A: A1c 8.1, similar to last visit; goal <8. Reports compliance with Lantus and glipizide but not for the past week as she has been out of meds. Some increased symptoms while not being on medication, but generally feels "okay" and wants to get refills of meds. Foot exam generally unremarkable; see separate problem list notes.  P: Continue Lantus and glipizide; new Rx's given today. Also refilled Reglan, Lipitor, ACE. Continue ASA. CMP and lipid panel drawn today. F/u in 2-3 months, or sooner if needed.

## 2014-07-16 NOTE — Progress Notes (Signed)
Subjective:    Patient ID: [name], female    DOB: [birth year], 69 y.o.   MRN: [id number]  HPI: Pt presents to clinic for general f/u, including DM, HTN, and HLD. She lately has had bad allergies with congestion, runny nose, itchy eyes, and sneezing. This has been worse with the changes in weather lately. Clarinex usually helps greatly but she has been out of it. She needs a refill of triamcinolone for itchy skin, as well. She has some calluses on her toes and pain in her right heel; she has not seen a podiatrist in the past, but is interested in seeing one and possibly getting inserts or diabetic shoes.  DM - generally feels well without specific complaints; does admit to being out of medications for about 1 week - has had some increased urination while being out of meds, but denies urinary pain, vaginal itching / burning - denies frank pain in her feet or legs but occasionally does have some burning pain in her feet - does not check her blood sugars regularly due to being out of strips "for a while" - denies frank hypoglycemic symptoms or definite hyperglycemic symptoms other than the above  HTN - denies frank headache, chest pain, SOB; does sometimes feel 'winded' if she doesn't take her medications - does have some mild ankle swelling that has been worse since being off meds for ~1 week - generally denies side effects from medications when she takes them  HLD - reports she took Lipitor for 1 month "a while ago," but did not follow up or request a refill - is agreeable to starting it long-term and did not have any issues while taking it  Of note, pt has been out of all her meds for about 1 week, including her BP and DM medications, as above and requests refills on "all of them."  Review of Systems: As above. She occasionally has "fits of worry" (anxiety, described similarly to panic attacks) that is managed "very infrequently" with Valium. Otherwise, full 12-system ROS was reviewed  and all negative.     Objective:   Physical Exam BP 188/73 mmHg  Pulse 60  Temp(Src) 97.9 F (36.6 C) (Oral)  Wt 152 lb (68.947 kg) Gen: well-appearing adult female in NAD HEENT: [place]/AT, sclerae/conjunctivae clear, no lid lag, EOMI, PERRLA   MMM, posterior oropharynx clear, no cervical lymphadenopathy  neck supple with full ROM, no masses appreciated; thyroid not enlarged  Cardio: RRR, no murmur appreciated; distal pulses intact/symmetric Pulm: CTAB, no wheezes, normal WOB  Abd: soft, nondistended, BS+, no HSM Ext: warm/well-perfused, no cyanosis/clubbing  Mild pitting edema of ankles, bilaterally MSK: strength 5/5 in all four extremities, no frank joint deformity/effusion  normal ROM to all four extremities with no point muscle/bony tenderness in spine Neuro/Psych: alert/oriented, sensation grossly intact; normal gait/balance  mood euthymic with congruent affect' Diabetic foot exam: see EPIC flowsheet  Generally unremarkable exam, sensation grossly intact, pulses equal, feet warm; mild callusing to bilateral toes  Right heel without frank tenderness or abnormality on exam  Skin intact, no bleeding / sores / drainage     Assessment & Plan:  69yo female with multiple chronic medical issues. - feet with calluses to toes and right heel pain, in context of diabetes with marginal control - referred to podiatry today for consideration of custom orthotics / diabetic shoes / other interventions - see problem list notes, otherwise - f/u in 2-3 months  Emmaline Kluver, MD PGY-3, [facility] 07/16/2014, 6:00 PM

## 2014-07-16 NOTE — Assessment & Plan Note (Signed)
Not on any specific dietary plan, and only took statin for about 1 month, previously. Overdue for lipid panel. Lipid panel and CMP drawn today for baseline; new Rx for Lipitor provided. Could consider increase dose to 80 mg based on lipid panel result, or rediscuss in person at follow-up in 2-3 months.

## 2014-07-16 NOTE — Progress Notes (Signed)
Well call in alternative, levocetirizine. Thanks. --CMS

## 2014-07-16 NOTE — Assessment & Plan Note (Signed)
Occasionally has panic-like symptoms, generally managed well with Valium PRN. Pt reports she takes it "very infrequently" (last refill was >1 year ago). New Rx given today. Pt to f/u specifically for this, as needed.

## 2014-07-16 NOTE — Patient Instructions (Signed)
Thank you for coming in, today!  I refilled all your medicines. I will check some labwork, today. I'll call you or write you a letter with the results.  Take all your medicines without any changes, for now. If you have any problems, call me to let me know. Otherwise, come back to see [place] in 2-3 months.  My last day is June 30th. After that, you'll have a different doctor, here. If you want to see me one more time before then you can. If you want to wait to meet your new doctor in July, you can. Please feel free to call with any questions or concerns at any time, at [phone]. --Dr. Venetia Maxon

## 2014-07-16 NOTE — Progress Notes (Signed)
Prior Authorization received from [employer] for Desloratadine. Preferred Formulary: Levocetirizine and Cetirizine. PA form placed in provider box for completion. Derl Barrow, RN

## 2014-07-16 NOTE — Assessment & Plan Note (Signed)
A: Elevated today (systolic) but off meds for 1+ week. Generally feels well when taking her meds and denies frank side effects. Some LE edema and vaguely described intermittent chest discomfort. No frank neurologic symptoms.  P: Refilled quinapril-HCTZ and Coreg. Checking CMP today for kidney function. Also refilled ASA and nitro, but advised pt to present to the ED if she has frank symptoms of chest pain, SOB, or worse LE edema that doesn't get better with restarting her medications. F/u in 2-3 months, or sooner if BP persists high (recommended checking at home or at pharmacy periodically).

## 2014-07-17 ENCOUNTER — Encounter: Payer: Self-pay | Admitting: Family Medicine

## 2014-07-17 LAB — COMPREHENSIVE METABOLIC PANEL
ALT: 11 U/L (ref 0–35)
AST: 13 U/L (ref 0–37)
Albumin: 3.6 g/dL (ref 3.5–5.2)
Alkaline Phosphatase: 81 U/L (ref 39–117)
BUN: 17 mg/dL (ref 6–23)
CO2: 24 mEq/L (ref 19–32)
Calcium: 8.8 mg/dL (ref 8.4–10.5)
Chloride: 105 mEq/L (ref 96–112)
Creat: 1.01 mg/dL (ref 0.50–1.10)
Glucose, Bld: 231 mg/dL — ABNORMAL HIGH (ref 70–99)
Potassium: 3.8 mEq/L (ref 3.5–5.3)
Sodium: 139 mEq/L (ref 135–145)
Total Bilirubin: 0.5 mg/dL (ref 0.2–1.2)
Total Protein: 6.5 g/dL (ref 6.0–8.3)

## 2014-07-17 LAB — LIPID PANEL
Cholesterol: 152 mg/dL (ref 0–200)
HDL: 41 mg/dL — ABNORMAL LOW (ref >=46)
LDL Cholesterol: 55 mg/dL (ref 0–99)
Total CHOL/HDL Ratio: 3.7 Ratio
Triglycerides: 278 mg/dL — ABNORMAL HIGH (ref <150)
VLDL: 56 mg/dL — ABNORMAL HIGH (ref 0–40)

## 2014-08-01 ENCOUNTER — Ambulatory Visit (INDEPENDENT_AMBULATORY_CARE_PROVIDER_SITE_OTHER): Payer: Medicare Other | Admitting: Podiatrist

## 2014-08-01 ENCOUNTER — Ambulatory Visit (INDEPENDENT_AMBULATORY_CARE_PROVIDER_SITE_OTHER): Payer: Medicare Other

## 2014-08-01 DIAGNOSIS — M7661 Achilles tendinitis, right leg: Secondary | ICD-10-CM

## 2014-08-01 DIAGNOSIS — M722 Plantar fascial fibromatosis: Secondary | ICD-10-CM | POA: Diagnosis not present

## 2014-08-01 DIAGNOSIS — R52 Pain, unspecified: Secondary | ICD-10-CM

## 2014-08-01 MED ORDER — MELOXICAM 15 MG PO TABS: 15.0000 mg | ORAL_TABLET | Freq: Every day | ORAL | Status: DC

## 2014-08-01 NOTE — Progress Notes (Signed)
   Subjective:    Patient ID: [name], female    DOB: April 12, 1945, 69 y.o.   MRN: [id number]  HPI Chief Complaint  Patient presents with  . Foot Pain    ''RT FOOT BACK OF THE HEEL AND LT FOOT GREAT TOE ARE PAINFUL.''      Review of Systems  Musculoskeletal: Positive for gait problem.       Objective:   Physical Exam  Patient is awake, alert, and oriented x 3.  In no acute distress.  Vascular status is intact with palpable pedal pulses at 2/4 DP and PT bilateral and capillary refill time within normal limits. Neurological sensation is also intact bilaterally via Semmes Weinstein monofilament at 5/5 sites. Light touch, vibratory sensation, Achilles tendon reflex is intact. Dermatological exam reveals skin color, turger and texture as normal. No open lesions present.  Musculature intact with dorsiflexion, plantarflexion, inversion, eversion.  Achilles tendon pain at insertion is palpated. Pain at the plantar fascia is also present right foot.  Mild left foot pain is reported with no specific area of pinpoint tenderness.  Achilles tendon is intact with thompson squeeze test.         Assessment & Plan:  Plantar Fasciitis and Achilles tendinitis right, compensatory left foot pain  Plan: Injected the right plantar fascia with Kenalog and Marcaine mixture under sterile technique. Discussed the think her left foot pain is compensatory. I'm going to put her on an anti-inflammatory medication if it's no better in 2 weeks she'll call.

## 2014-08-01 NOTE — Patient Instructions (Signed)

## 2014-08-06 DIAGNOSIS — H26493 Other secondary cataract, bilateral: Secondary | ICD-10-CM | POA: Diagnosis not present

## 2014-08-06 DIAGNOSIS — E11341 Type 2 diabetes mellitus with severe nonproliferative diabetic retinopathy with macular edema: Secondary | ICD-10-CM | POA: Diagnosis not present

## 2014-08-06 DIAGNOSIS — H4011X2 Primary open-angle glaucoma, moderate stage: Secondary | ICD-10-CM | POA: Diagnosis not present

## 2014-08-08 ENCOUNTER — Encounter (INDEPENDENT_AMBULATORY_CARE_PROVIDER_SITE_OTHER): Payer: Medicare Other | Admitting: Ophthalmology

## 2014-08-08 DIAGNOSIS — H35033 Hypertensive retinopathy, bilateral: Secondary | ICD-10-CM

## 2014-08-08 DIAGNOSIS — E11339 Type 2 diabetes mellitus with moderate nonproliferative diabetic retinopathy without macular edema: Secondary | ICD-10-CM | POA: Diagnosis not present

## 2014-08-08 DIAGNOSIS — E11351 Type 2 diabetes mellitus with proliferative diabetic retinopathy with macular edema: Secondary | ICD-10-CM | POA: Diagnosis not present

## 2014-08-08 DIAGNOSIS — H43813 Vitreous degeneration, bilateral: Secondary | ICD-10-CM

## 2014-08-08 DIAGNOSIS — I10 Essential (primary) hypertension: Secondary | ICD-10-CM

## 2014-08-08 DIAGNOSIS — H35371 Puckering of macula, right eye: Secondary | ICD-10-CM

## 2014-08-08 DIAGNOSIS — E11311 Type 2 diabetes mellitus with unspecified diabetic retinopathy with macular edema: Secondary | ICD-10-CM | POA: Diagnosis not present

## 2014-09-03 DIAGNOSIS — H26493 Other secondary cataract, bilateral: Secondary | ICD-10-CM | POA: Diagnosis not present

## 2014-09-03 DIAGNOSIS — H4011X2 Primary open-angle glaucoma, moderate stage: Secondary | ICD-10-CM | POA: Diagnosis not present

## 2014-09-18 ENCOUNTER — Encounter: Payer: Self-pay | Admitting: Family Medicine

## 2014-09-18 ENCOUNTER — Ambulatory Visit (INDEPENDENT_AMBULATORY_CARE_PROVIDER_SITE_OTHER): Payer: Medicare Other | Admitting: Family Medicine

## 2014-09-18 VITALS — BP 131/80 | HR 57 | Temp 97.8°F | Ht 64.0 in | Wt 149.0 lb

## 2014-09-18 DIAGNOSIS — N390 Urinary tract infection, site not specified: Secondary | ICD-10-CM | POA: Insufficient documentation

## 2014-09-18 DIAGNOSIS — R3911 Hesitancy of micturition: Secondary | ICD-10-CM

## 2014-09-18 DIAGNOSIS — N3000 Acute cystitis without hematuria: Secondary | ICD-10-CM

## 2014-09-18 LAB — POCT URINALYSIS DIPSTICK
Bilirubin, UA: NEGATIVE
Glucose, UA: 500
Ketones, UA: NEGATIVE
Leukocytes, UA: NEGATIVE
Nitrite, UA: POSITIVE
Protein, UA: 100
Spec Grav, UA: 1.025
Urobilinogen, UA: 0.2
pH, UA: 5.5

## 2014-09-18 MED ORDER — CEPHALEXIN 500 MG PO CAPS: 500.0000 mg | ORAL_CAPSULE | Freq: Two times a day (BID) | ORAL | Status: DC

## 2014-09-18 NOTE — Assessment & Plan Note (Signed)
Uncomplicated (though does have DM) likely gram negative w/+nitrites on U/A.  - Keflex 500mg  BID x 7 days - Follow urine culture

## 2014-09-18 NOTE — Progress Notes (Signed)
Subjective: [name] is a 69 y.o. female who complains of dysuria.   She complains of difficulty urinating at times for the past 2 - 3 years and has a cystocele followed by urology, but has had burning with urination for the past 2 days. Normal urine frequency and no nocturia. Urine character has been normal.   Denies abdominal pain, nausea or vomiting, flank pain, fever, chills, or abnormal vaginal discharge or bleeding.   no history of instrumentation PMH: + for diabetes, neg for immunocompromise  Objective: BP 131/80 mmHg  Pulse 57  Temp(Src) 97.8 F (36.6 C) (Oral)  Ht 5\' 4"  (1.626 m)  Wt 149 lb (67.586 kg)  BMI 25.56 kg/m2  Gen:  69 y.o. female in no distress Abd: Soft, NTND, BS present, mild suprapubic tenderness. No CVA tenderness. Skin: No rashes noted MSK: No signs of joint effusions. No edema U/A: + nitrites   Assessment & Plan: [name] is a 69 y.o. female with uncomplicated urinary tract infection.

## 2014-09-18 NOTE — Patient Instructions (Signed)
Thanks for coming in today.  It does indeed look like you have a urinary tract infection.  Take the Keflex twice daily for the next 7 days.  We will send your urine specimen for culture to make sure the antibiotics are the right ones.    If you start having any worsening pain, vomiting, unable to hold down fluids, fevers, or other concerns, do not hesitate to call the clinic at [phone] or go to the ED if after hours.    Take care! - Dr. Bonner Puna

## 2014-09-21 LAB — URINE CULTURE: Colony Count: >=100000

## 2014-11-17 ENCOUNTER — Ambulatory Visit (INDEPENDENT_AMBULATORY_CARE_PROVIDER_SITE_OTHER): Payer: Medicare Other | Admitting: Obstetrics and Gynecology

## 2014-11-17 ENCOUNTER — Encounter: Payer: Self-pay | Admitting: Obstetrics and Gynecology

## 2014-11-17 VITALS — BP 172/82 | HR 80 | Temp 98.5°F | Ht 64.0 in | Wt 151.0 lb

## 2014-11-17 DIAGNOSIS — R1310 Dysphagia, unspecified: Secondary | ICD-10-CM | POA: Diagnosis not present

## 2014-11-17 DIAGNOSIS — I10 Essential (primary) hypertension: Secondary | ICD-10-CM | POA: Diagnosis not present

## 2014-11-17 HISTORY — DX: Dysphagia, unspecified: R13.10

## 2014-11-17 MED ORDER — AMLODIPINE BESYLATE 2.5 MG PO TABS: 2.5000 mg | ORAL_TABLET | Freq: Every day | ORAL | Status: DC

## 2014-11-17 NOTE — Assessment & Plan Note (Signed)
A: Mechanical esophageal dysphagia with the sensation of food getting "stuck". No concerning red flags on exam. Patient stable and able to tolerate eating still. P: - non-urgent referral placed to return to [facility] for possible esophageal dilation -continue PPI -Encouraged patient to eat a soft diet until she can be evaluated to prevent any aspiration or choking -handout given

## 2014-11-17 NOTE — Progress Notes (Signed)
   Subjective:   Patient ID: [name], female    DOB: [birth year], 69 y.o.   MRN: [id number]  Patient presents for Same Day Appointment  CC: Dysphagia  HPI: # Diffuculty swallowing: -Going on for over a month -Everything wants to get stuck - mainly pillls and meat -Soft foods slide down fine -Has to drink fluids to get it to go down -Staying stuck for longer -localizes area of occlusion to upper throat -Some associated drooling and nasuea -has been eating less due to fear of choking  H/o  -hiatal hernia repair that cause sever reflux (resolved) -Has had esophagus stretched several times before by [facility]. Last done in 2007.  -Cancer of colon in 2007 - chemotherapy , s/p colon resection  ROS: denies weight loss, heartburn, fever, odonophagia, dysarthria, vomiting  SH - Denies tobacco use, EtOH, or illicit drugs  Review of Systems   See HPI for ROS.   Objective:  BP 172/82 mmHg  Pulse 80  Temp(Src) 98.5 F (36.9 C) (Oral)  Ht 5\' 4"  (1.626 m)  Wt 151 lb (68.493 kg)  BMI 25.91 kg/m2 Vitals and nursing note reviewed  Physical Exam  Constitutional: She is oriented to person, place, and time and well-developed, well-nourished, and in no distress.  HENT:  Head: Normocephalic and atraumatic.  Mouth/Throat: Oropharynx is clear and moist.  Eyes: EOM are normal.  Cardiovascular: Normal rate, regular rhythm and normal heart sounds.   Pulmonary/Chest: Effort normal and breath sounds normal.  Abdominal: Soft. Bowel sounds are normal. There is no tenderness.  Neurological: She is alert and oriented to person, place, and time. She has normal sensation, normal strength and intact cranial nerves.  No focal deficits appreciated    Assessment & Plan:  See Problem List Documentation   Luiz Blare, DO 11/17/2014, 3:25 PM PGY-2, [place] [facility]

## 2014-11-17 NOTE — Patient Instructions (Addendum)
-  Started you on new BP medication. Limited supply given in case your PCP decides to change dose or discontinue. -Referral placed for GI  -keep follow-up appointment next week   Dysphagia Swallowing problems (dysphagia) occur when solids and liquids seem to stick in your throat on the way down to your stomach, or the food takes longer to get to the stomach. Other symptoms include regurgitating food, noises coming from the throat, chest discomfort with swallowing, and a feeling of fullness or the feeling of something being stuck in your throat when swallowing. When blockage in your throat is complete, it may be associated with drooling. CAUSES  Problems with swallowing may occur because of problems with the muscles. The food cannot be propelled in the usual manner into your stomach. You may have ulcers, scar tissue, or inflammation in the tube down which food travels from your mouth to your stomach (esophagus), which blocks food from passing normally into the stomach. Causes of inflammation include:  Acid reflux from your stomach into your esophagus.  Infection.  Radiation treatment for cancer.  Medicines taken without enough fluids to wash them down into your stomach. You may have nerve problems that prevent signals from being sent to the muscles of your esophagus to contract and move your food down to your stomach. Globus pharyngeus is a relatively common problem in which there is a sense of an obstruction or difficulty in swallowing, without any physical abnormalities of the swallowing passages being found. This problem usually improves over time with reassurance and testing to rule out other causes. DIAGNOSIS Dysphagia can be diagnosed and its cause can be determined by tests in which you swallow a white substance that helps illuminate the inside of your throat (contrast medium) while X-rays are taken. Sometimes a flexible telescope that is inserted down your throat (endoscopy) to look at your  esophagus and stomach is used. TREATMENT   If the dysphagia is caused by acid reflux or infection, medicines may be used.  If the dysphagia is caused by problems with your swallowing muscles, swallowing therapy may be used to help you strengthen your swallowing muscles.  If the dysphagia is caused by a blockage or mass, procedures to remove the blockage may be done. HOME CARE INSTRUCTIONS  Try to eat soft food that is easier to swallow and check your weight on a daily basis to be sure that it is not decreasing.  Be sure to drink liquids when sitting upright (not lying down). SEEK MEDICAL CARE IF:  You are losing weight because you are unable to swallow.  You are coughing when you drink liquids (aspiration).  You are coughing up partially digested food. SEEK IMMEDIATE MEDICAL CARE IF:  You are unable to swallow your own saliva .  You are having shortness of breath or a fever, or both.  You have a hoarse voice along with difficulty swallowing. MAKE SURE YOU:  Understand these instructions.  Will watch your condition.  Will get help right away if you are not doing well or get worse. Document Released: 03/25/2000 Document Revised: 08/12/2013 Document Reviewed: 09/14/2012 [facility] Patient Information 2015 [place], [place]. This information is not intended to replace advice given to you by your health care provider. Make sure you discuss any questions you have with your health care provider.

## 2014-11-17 NOTE — Assessment & Plan Note (Addendum)
BP elevated on exam today. Already on 3 BP medications. Added Norvasc to help with blood pressure control. She should follow-up with PCP in next 2 weeks to recheck BP and adjust medications as needed.

## 2014-11-18 ENCOUNTER — Encounter: Payer: Self-pay | Admitting: Internal Medicine

## 2014-11-24 ENCOUNTER — Encounter: Payer: Self-pay | Admitting: Family Medicine

## 2014-11-24 ENCOUNTER — Ambulatory Visit (INDEPENDENT_AMBULATORY_CARE_PROVIDER_SITE_OTHER): Payer: Medicare Other | Admitting: Family Medicine

## 2014-11-24 VITALS — BP 150/56 | HR 62 | Temp 98.2°F | Ht 64.0 in | Wt 152.6 lb

## 2014-11-24 DIAGNOSIS — K219 Gastro-esophageal reflux disease without esophagitis: Secondary | ICD-10-CM | POA: Diagnosis not present

## 2014-11-24 DIAGNOSIS — E1165 Type 2 diabetes mellitus with hyperglycemia: Secondary | ICD-10-CM

## 2014-11-24 DIAGNOSIS — IMO0002 Reserved for concepts with insufficient information to code with codable children: Secondary | ICD-10-CM

## 2014-11-24 DIAGNOSIS — R42 Dizziness and giddiness: Secondary | ICD-10-CM

## 2014-11-24 DIAGNOSIS — I10 Essential (primary) hypertension: Secondary | ICD-10-CM | POA: Diagnosis not present

## 2014-11-24 DIAGNOSIS — R0789 Other chest pain: Secondary | ICD-10-CM

## 2014-11-24 DIAGNOSIS — D320 Benign neoplasm of cerebral meninges: Secondary | ICD-10-CM | POA: Diagnosis not present

## 2014-11-24 LAB — POCT GLYCOSYLATED HEMOGLOBIN (HGB A1C): Hemoglobin A1C: 7.9

## 2014-11-24 MED ORDER — ESOMEPRAZOLE MAGNESIUM 40 MG PO PACK: 40.0000 mg | PACK | Freq: Every day | ORAL | Status: DC

## 2014-11-24 MED ORDER — NITROGLYCERIN 0.4 MG SL SUBL: 0.4000 mg | SUBLINGUAL_TABLET | SUBLINGUAL | Status: DC | PRN

## 2014-11-24 MED ORDER — LEVOCETIRIZINE DIHYDROCHLORIDE 5 MG PO TABS: 5.0000 mg | ORAL_TABLET | Freq: Every evening | ORAL | Status: DC

## 2014-11-24 MED ORDER — GLIPIZIDE 10 MG PO TABS: 10.0000 mg | ORAL_TABLET | Freq: Every day | ORAL | Status: DC

## 2014-11-24 MED ORDER — HYDROXYZINE PAMOATE 25 MG PO CAPS: 25.0000 mg | ORAL_CAPSULE | Freq: Three times a day (TID) | ORAL | Status: DC | PRN

## 2014-11-24 NOTE — Patient Instructions (Signed)
It was great seeing you today.   1. Stop taking Lantus 2. Continue taking glipizide 10 mg every morning 3. Check your blood sugar first thing in the morning before eating or taking medications; bring your meter or your blood sugar readings to her next doctor's appointment 4. Stop taking diazepam / Valium for dizziness 5. Call your neurologist and discuss follow-up appointment and whether referral or MRI as needed for your meningioma   Please bring all your medications to every doctors visit  Sign up for My Chart to have easy access to your labs results, and communication with your Primary care physician.  Next Appointment  Please make an appointment with Dr Berkley Harvey in 2 weeks for diabetes and itching   I look forward to talking with you again at our next visit. If you have any questions or concerns before then, please call the clinic at [phone].  Take Care,   Dr Phill Myron

## 2014-11-25 NOTE — Assessment & Plan Note (Signed)
Chronic episodic vertigo consistent with BPPV; However does have a history of meningioma, colon cancer, bradycardia, and hypoglycemia - Responded to physical therapy prior: Will refer to PT - Discontinue Valium; Not appropriate therapy and could contribute to increased dizziness and falls

## 2014-11-25 NOTE — Assessment & Plan Note (Signed)
BP borderline elevated today, but reports all home readings under 140/90 - No changes to blood pressure medications today.  Asked her to bring log of blood pressure readings to next visit - Consider ambulatory blood pressure monitoring

## 2014-11-25 NOTE — Assessment & Plan Note (Signed)
Takes nitroglycerin once a week when her chest feels funny - Denies chest pain, shortness of breath, syncopal episodes or previous MIs - Advised she make an appointment in 1-2 weeks to discuss this further and consider cardiology referral

## 2014-11-25 NOTE — Progress Notes (Signed)
  Patient name: RAYNNA HOLVECK MRN [id number]  Date of birth: 01-Apr-1946  CC & HPI:  SHANTELLA FEELEY is a 69 y.o. female presenting today for multiple medical problems.   DIABETES - doesn't take Lantus multiple days a week due to fear of low blood sugars and cost of needles and supplies.  Lab Results  Component Value Date   HGBA1C 7.9 11/24/2014    Blood Sugar Ranges: Not checking regularly  Symptoms of Hypoglycemia?  Has episodic dizziness but not checking blood sugars  Comorbid Symptoms:  Denies Neuropathy; Vision problems  Medication Side Effects: possible dizziness.   CHRONIC HYPERTENSION BP Readings from Last 3 Encounters:  11/24/14 150/56  11/17/14 172/82  09/18/14 131/80    Disease Monitoring  Blood pressure range outside clinc: Reports numbers less than 140/90  Chest pain: no   Dyspnea: no   Claudication: no   HLD Lab Results  Component Value Date   LDLCALC 55 07/16/2014    Medication Compliance: yes  Side Effects?: denies muscle pain or weakness, RUQ pain; jaundice  financial difficulties: yes, Lantus and needles are expensive   Dizziness - Episodic dizziness for the past several years.  Reports vertigo / room spinning symptoms with positional changes.  Most often when lying in bed and turning to sign, bending over and turning quickly.  Symptoms last for seconds to minutes and resolved with being still.  - She reports she has treated these symptoms with Valium; she uses this once every 1-2 weeks - Previously treated with physical therapy several years ago that improved symptoms - Also has history of meningioma discovered on MRI that has not been followed up by neurology - Denies night sweats, weight loss, or new neurological symptoms - Takes insulin and glipizide intermittently based on "how she feels".  Doesn't check blood sugar regularly or during episodes of dizziness  Chest pain - She denies any current chest pain, shortness of breath, palpitations, or  syncopal episodes - Reports taking nitroglycerin once a week when her chest feels funny - Has not seen a cardiologist in over a year; history of bradycardia - Denies history of MI  Itching - Unable to fully discussed due to time limitations - Chronic itching on back And arms - Minimal improvement with Zyrtec - Has been using steroid creams chronically with some relief.  - Reports using Vaseline daily  ROS: See HPI   Medical & Surgical Hx:  Reviewed  Medications & Allergies: Reviewed  Social History: Reviewed:   Objective Findings:  Vitals: BP 150/56 mmHg  Pulse 62  Temp(Src) 98.2 F (36.8 C) (Oral)  Ht 5\' 4"  (1.626 m)  Wt 152 lb 9 oz (69.202 kg)  BMI 26.17 kg/m2  Gen: NAD CV: RRR w/o m/r/g, pulses +2 b/l Resp: CTAB w/ normal respiratory effort Skin: Hyperpigmented posterior neck with excoriations   Assessment & Plan:   Please See Problem Focused Assessment & Plan

## 2014-11-25 NOTE — Assessment & Plan Note (Signed)
A1c less than 8 - Recommended she discontinue glipizide due to episodes of dizziness and propensity for hypoglycemia with simultaneous insulin use - However, she does not use insulin regularly due to cost and fever with low blood sugars; reports using it 1 to 3 times a week.  She is in favor of continuing glipizide only - Discontinue Lantus; continue glipizide 10 mg every morning - She will bring blood sugar log and meter to next appointment in 2 weeks

## 2014-11-25 NOTE — Assessment & Plan Note (Signed)
Found incidentally on MRI scan.  Has not followed up with neurologist since.  No new neurological symptoms - Recommend that she contact her neurologist and let me know if she needs a referral or repeat scan prior to seeing

## 2015-01-13 ENCOUNTER — Other Ambulatory Visit: Payer: Self-pay | Admitting: *Deleted

## 2015-01-14 MED ORDER — ATORVASTATIN CALCIUM 40 MG PO TABS: 40.0000 mg | ORAL_TABLET | Freq: Every day | ORAL | Status: DC

## 2015-01-19 ENCOUNTER — Encounter: Payer: Self-pay | Admitting: Internal Medicine

## 2015-01-19 ENCOUNTER — Ambulatory Visit (INDEPENDENT_AMBULATORY_CARE_PROVIDER_SITE_OTHER): Payer: Medicare Other | Admitting: Internal Medicine

## 2015-01-19 VITALS — BP 150/90 | HR 52 | Ht 64.0 in | Wt 154.1 lb

## 2015-01-19 DIAGNOSIS — R1314 Dysphagia, pharyngoesophageal phase: Secondary | ICD-10-CM

## 2015-01-19 DIAGNOSIS — R1319 Other dysphagia: Secondary | ICD-10-CM

## 2015-01-19 DIAGNOSIS — R1310 Dysphagia, unspecified: Secondary | ICD-10-CM

## 2015-01-19 NOTE — Progress Notes (Signed)
   Subjective:    Patient ID: [name], female    DOB: 10/15/1945, 69 y.o.   MRN: [id number] Cc: trouble swallowing  HPI Patient is a very nice middle-aged African-American woman I know from previous colonoscopy and colon cancer diagnosis. For the past several months she has been having difficulty swallowing food and pills. She describes a suprasternal impact sticking point. She is not had unintentional weight loss or bleeding. Sometimes she has to regurgitate the food bolus and a lot of phlegm and saliva comes out with that as well. She has some heartburn as well as. There is some epigastric pain also at times. She has chronic alternating bowel habit issues. She is up-to-date on colonoscopy surveillance.  Note she has had reflux surgery before I a fundoplication. EGD in 2008 demonstrated that to be intact. She does not recall any type of injury or change since that time. She is currently on  antireflux medications that include Reglan and Nexium. Medications, allergies, past medical history, past surgical history, family history and social history are reviewed and updated in the EMR.   Review of Systems As per history of present illness. She also has some headaches, back pain, itchy skin, urinary incontinence issues. All other review of systems are negative.    Objective:   Physical Exam @BP  150/90 mmHg  Pulse 52  Ht 5\' 4"  (1.626 m)  Wt 154 lb 2 oz (69.911 kg)  BMI 26.44 kg/m2@  General:  Well-developed, well-nourished and in no acute distress Eyes:  anicteric. ENT:   Mouth and posterior pharynx free of lesions.  Neck:   supple w/o thyromegaly or mass.  Lungs: Clear to auscultation bilaterally. Heart:  S1S2, no rubs, murmurs, gallops. Abdomen:  soft, non-tender, no hepatosplenomegaly, hernia, or mass and BS+.  Lymph:  no cervical or supraclavicular adenopathy. Extremities:   no edema, cyanosis or clubbing Skin   no rash. Neuro:  A&O x 3.  Psych:  appropriate mood and   Affect.   Data Reviewed: Previous EGD colonoscopy reports. Her hemoglobin A1c in April was 8.1.     Assessment & Plan:  Esophageal dysphagia  She is also status post fundoplication procedure. It's unusual to have late dysphagia related to that though perhaps she's had some sort of slippage and now has an esophageal stricture. Motility disturbance is possible. Will start workup with upper GI endoscopy and possible esophageal dilation depending upon what is seen. The risks and benefits as well as alternatives of endoscopic procedure(s) have been discussed and reviewed. All questions answered. The patient agrees to proceed. CC: Phill Myron, MD

## 2015-01-19 NOTE — Patient Instructions (Signed)
  You have been scheduled for an endoscopy. Please follow written instructions given to you at your visit today. If you use inhalers (even only as needed), please bring them with you on the day of your procedure.   I appreciate the opportunity to care for you. Carl Gessner, MD, FACG 

## 2015-01-20 ENCOUNTER — Ambulatory Visit (AMBULATORY_SURGERY_CENTER): Payer: Medicare Other | Admitting: Internal Medicine

## 2015-01-20 ENCOUNTER — Encounter: Payer: Self-pay | Admitting: Internal Medicine

## 2015-01-20 VITALS — BP 136/89 | HR 51 | Temp 96.5°F | Resp 53 | Ht 64.0 in | Wt 154.0 lb

## 2015-01-20 DIAGNOSIS — K208 Other esophagitis: Secondary | ICD-10-CM | POA: Diagnosis not present

## 2015-01-20 DIAGNOSIS — R1310 Dysphagia, unspecified: Secondary | ICD-10-CM | POA: Diagnosis not present

## 2015-01-20 DIAGNOSIS — R1314 Dysphagia, pharyngoesophageal phase: Secondary | ICD-10-CM

## 2015-01-20 DIAGNOSIS — K229 Disease of esophagus, unspecified: Secondary | ICD-10-CM

## 2015-01-20 DIAGNOSIS — R1319 Other dysphagia: Secondary | ICD-10-CM

## 2015-01-20 LAB — GLUCOSE, CAPILLARY
Glucose-Capillary: 192 mg/dL — ABNORMAL HIGH (ref 65–99)
Glucose-Capillary: 163 mg/dL — ABNORMAL HIGH (ref 65–99)

## 2015-01-20 MED ORDER — SODIUM CHLORIDE 0.9 % IV SOLN: 500.0000 mL | INTRAVENOUS | Status: DC

## 2015-01-20 NOTE — Patient Instructions (Addendum)
   There was slight inflammation where the esophagus and stomach meet. I took biopsies. I dilated that area also.  If you do not swallow better after this will need to evaluate the squeezing and relaxing of the esophagus with other tests.  I appreciate the opportunity to care for you. Gatha Mayer, MD, FACG  YOU HAD AN ENDOSCOPIC PROCEDURE TODAY AT [facility]:   Refer to the procedure report that was given to you for any specific questions about what was found during the examination.  If the procedure report does not answer your questions, please call your gastroenterologist to clarify.  If you requested that your care partner not be given the details of your procedure findings, then the procedure report has been included in a sealed envelope for you to review at your convenience later.  YOU SHOULD EXPECT: Some feelings of bloating in the abdomen. Passage of more gas than usual.  Walking can help get rid of the air that was put into your GI tract during the procedure and reduce the bloating  Please Note:  You might notice some irritation and congestion in your nose or some drainage.  This is from the oxygen used during your procedure.  There is no need for concern and it should clear up in a day or so.  SYMPTOMS TO REPORT IMMEDIATELY:    Following upper endoscopy (EGD)  Vomiting of blood or coffee ground material  New chest pain or pain under the shoulder blades  Painful or persistently difficult swallowing  New shortness of breath  Fever of 100F or higher  Black, tarry-looking stools  For urgent or emergent issues, a gastroenterologist can be reached at any hour by calling [phone].   DIET: FOLLOW DILATION DIET TODAY- SEE HANDOUT   ACTIVITY:  You should plan to take it easy for the rest of today and you should NOT DRIVE or use heavy machinery until tomorrow (because of the sedation medicines used during the test).    FOLLOW UP: Our staff will call  the number listed on your records the next business day following your procedure to check on you and address any questions or concerns that you may have regarding the information given to you following your procedure. If we do not reach you, we will leave a message.  However, if you are feeling well and you are not experiencing any problems, there is no need to return our call.  We will assume that you have returned to your regular daily activities without incident.  If any biopsies were taken you will be contacted by phone or by letter within the next 1-3 weeks.  Please call us at [phone] if you have not heard about the biopsies in 3 weeks.    SIGNATURES/CONFIDENTIALITY: You and/or your care partner have signed paperwork which will be entered into your electronic medical record.  These signatures attest to the fact that that the information above on your After Visit Summary has been reviewed and is understood.  Full responsibility of the confidentiality of this discharge information lies with you and/or your care-partner.  CONTINUE YOUR NORMAL MEDICATIONS

## 2015-01-20 NOTE — Op Note (Addendum)
[place]  [address] [place], [id number]   ENDOSCOPY PROCEDURE REPORT  PATIENT: [name], [name]  MR#: [id number] BIRTHDATE: 1946/03/28 , 69  yrs. old GENDER: female ENDOSCOPIST: Gatha Mayer, MD, [facility] PROCEDURE DATE:  01/20/2015 PROCEDURE:  EGD w/ biopsy and EGD w/ balloon dilation ASA CLASS:     Class II INDICATIONS:  dysphagia. MEDICATIONS: Propofol 250 mg IV and Monitored anesthesia care TOPICAL ANESTHETIC: none  DESCRIPTION OF PROCEDURE: After the risks benefits and alternatives of the procedure were thoroughly explained, informed consent was obtained.  The LB [id number] endoscope was introduced through the mouth and advanced to the second portion of the duodenum , Without limitations.  The instrument was slowly withdrawn as the mucosa was fully examined.    1) Single distal esophageal erison - superficial and 5-7 mm. Biopsied 2) S/P fundoplication - GE junction dilated to 20 mm (18-19-20 mm balloon) 3) Otherwise normal except some tortuosity of esophagus.  Retroflexed views revealed as previously described. The scope was then withdrawn from the patient and the procedure completed.  COMPLICATIONS: There were no immediate complications.  ENDOSCOPIC IMPRESSION: 1) Single distal esophageal erison - superficial and 5-7 mm. Biopsied 2) S/P fundoplication - GE junction dilated to 20 mm (18-19-20 mm balloon) 3) Otherwise normal except some tortuosity of esophagus  RECOMMENDATIONS: 1.  Clear liquids until 1100, then soft foods rest of day.  Resume prior diet tomorrow. 2.  Office will call with results 3.  If persistent dysphagia anticipate Ba swallow - will adjust medications pending biopsy results  eSigned:  Gatha Mayer, MD, [facility] 01/20/2015 10:06 AM Revised: 01/20/2015 10:06 AM   CC:The Patient, Dr. Phill Myron

## 2015-01-20 NOTE — Progress Notes (Signed)
Transferred to recovery room. A/O x3, pleased with MAC.  VSS.  Report to [place], Therapist, sports.

## 2015-01-20 NOTE — Progress Notes (Signed)
Called to room to assist during endoscopic procedure.  Patient ID and intended procedure confirmed with present staff. Received instructions for my participation in the procedure from the performing physician.  

## 2015-01-20 NOTE — Progress Notes (Signed)
Patient complains of mid sternal chest pain, 8/10. Patient stating this happens whenever she takes her medication. Starting this am at 0530 with taking her medication. Informed Dr Carlean Purl and Tera Helper CRNA. Vital signs stable.

## 2015-01-21 ENCOUNTER — Telehealth: Payer: Self-pay

## 2015-01-21 NOTE — Telephone Encounter (Signed)
  Follow up Call-  No flowsheet data found.   Patient questions:  Do you have a fever, pain , or abdominal swelling? No. Pain Score  0 *  Have you tolerated food without any problems? Yes.    Have you been able to return to your normal activities? Yes.    Do you have any questions about your discharge instructions: Diet   Yes.   Medications  No. Follow up visit  No.  Do you have questions or concerns about your Care? No.  Actions: * If pain score is 4 or above: No action needed, pain <4.

## 2015-01-29 NOTE — Progress Notes (Signed)
Quick Note:  Biopsies show inflammation Please call and inform and see how she is doing w/ dysphagia after dilation  No letter or recall from [place] ______

## 2015-01-30 ENCOUNTER — Other Ambulatory Visit: Payer: Self-pay

## 2015-01-30 DIAGNOSIS — IMO0001 Reserved for inherently not codable concepts without codable children: Secondary | ICD-10-CM

## 2015-01-30 DIAGNOSIS — K219 Gastro-esophageal reflux disease without esophagitis: Principal | ICD-10-CM

## 2015-01-30 NOTE — Progress Notes (Signed)
Quick Note:  Please ask her to do an upper GI series re: reflux sxs, s/p fundoplication ______

## 2015-02-05 ENCOUNTER — Ambulatory Visit (HOSPITAL_COMMUNITY)
Admission: RE | Admit: 2015-02-05 | Discharge: 2015-02-05 | Disposition: A | Discharge status: Home / Self Care | Payer: Medicare Other | Source: Ambulatory Visit | Attending: Internal Medicine | Admitting: Internal Medicine

## 2015-02-05 DIAGNOSIS — K219 Gastro-esophageal reflux disease without esophagitis: Secondary | ICD-10-CM | POA: Insufficient documentation

## 2015-02-05 DIAGNOSIS — R1310 Dysphagia, unspecified: Secondary | ICD-10-CM | POA: Insufficient documentation

## 2015-02-05 DIAGNOSIS — IMO0001 Reserved for inherently not codable concepts without codable children: Secondary | ICD-10-CM

## 2015-02-08 NOTE — Progress Notes (Signed)
Quick Note:  Please schedule esophageal manometry - dx: dysphagia  Explain that we need to understand if esophagus working properly vs problem just where she had fundoplication ______

## 2015-02-09 ENCOUNTER — Ambulatory Visit (INDEPENDENT_AMBULATORY_CARE_PROVIDER_SITE_OTHER): Payer: Medicare Other | Admitting: Ophthalmology

## 2015-02-11 ENCOUNTER — Ambulatory Visit (INDEPENDENT_AMBULATORY_CARE_PROVIDER_SITE_OTHER): Payer: Medicare Other | Admitting: Ophthalmology

## 2015-02-23 ENCOUNTER — Other Ambulatory Visit: Payer: Self-pay

## 2015-02-23 DIAGNOSIS — Z1231 Encounter for screening mammogram for malignant neoplasm of breast: Secondary | ICD-10-CM

## 2015-03-18 ENCOUNTER — Ambulatory Visit
Admission: RE | Admit: 2015-03-18 | Discharge: 2015-03-18 | Disposition: A | Discharge status: Home / Self Care | Payer: Medicare Other | Source: Ambulatory Visit

## 2015-03-18 DIAGNOSIS — Z1231 Encounter for screening mammogram for malignant neoplasm of breast: Secondary | ICD-10-CM | POA: Diagnosis not present

## 2015-03-20 ENCOUNTER — Encounter (HOSPITAL_COMMUNITY)
Admission: RE | Disposition: A | Discharge status: Home / Self Care | Payer: Self-pay | Source: Ambulatory Visit | Attending: Internal Medicine

## 2015-03-20 ENCOUNTER — Ambulatory Visit (HOSPITAL_COMMUNITY)
Admission: RE | Admit: 2015-03-20 | Discharge: 2015-03-20 | Disposition: A | Discharge status: Home / Self Care | Payer: Medicare Other | Source: Ambulatory Visit | Attending: Internal Medicine | Admitting: Internal Medicine

## 2015-03-20 DIAGNOSIS — R1310 Dysphagia, unspecified: Secondary | ICD-10-CM | POA: Diagnosis not present

## 2015-03-20 HISTORY — PX: ESOPHAGEAL MANOMETRY: SHX5429

## 2015-03-20 SURGERY — MANOMETRY, ESOPHAGUS

## 2015-03-20 MED ORDER — LIDOCAINE VISCOUS 2 % MT SOLN
OROMUCOSAL | Status: AC
  Filled 2015-03-20: qty 15

## 2015-03-20 SURGICAL SUPPLY — 2 items
FACESHIELD LNG OPTICON STERILE (SAFETY) IMPLANT
GLOVE BIO SURGEON STRL SZ8 (GLOVE) ×4 IMPLANT

## 2015-03-23 ENCOUNTER — Encounter (HOSPITAL_COMMUNITY): Payer: Self-pay | Admitting: Internal Medicine

## 2015-04-22 ENCOUNTER — Encounter: Payer: Self-pay | Admitting: Family Medicine

## 2015-04-22 DIAGNOSIS — Z1211 Encounter for screening for malignant neoplasm of colon: Secondary | ICD-10-CM | POA: Insufficient documentation

## 2015-05-28 ENCOUNTER — Telehealth: Payer: Self-pay | Admitting: *Deleted

## 2015-05-28 NOTE — Telephone Encounter (Signed)
Called patient to offer flu vaccine. Scheduled patient to receive flu vaccine on 06/02/2105 at 2:15 PM. DUCATTE, Orvis Brill, RN

## 2015-06-03 ENCOUNTER — Ambulatory Visit (INDEPENDENT_AMBULATORY_CARE_PROVIDER_SITE_OTHER): Payer: Medicare Other | Admitting: *Deleted

## 2015-06-03 ENCOUNTER — Other Ambulatory Visit: Payer: Self-pay | Admitting: Family Medicine

## 2015-06-03 DIAGNOSIS — Z23 Encounter for immunization: Secondary | ICD-10-CM

## 2015-06-03 MED ORDER — ZOSTER VACCINE LIVE 19400 UNT/0.65ML ~~LOC~~ SOLR: 0.6500 mL | Freq: Once | SUBCUTANEOUS | Status: DC

## 2015-06-22 ENCOUNTER — Other Ambulatory Visit: Payer: Self-pay | Admitting: *Deleted

## 2015-06-22 MED ORDER — GLIPIZIDE 10 MG PO TABS: 10.0000 mg | ORAL_TABLET | Freq: Every day | ORAL | Status: DC

## 2015-06-22 NOTE — Telephone Encounter (Signed)
Pt informed and appt made. Fleeger, Jessica Dawn, CMA  

## 2015-06-22 NOTE — Telephone Encounter (Signed)
Please call. Needs appointment to discuss diabetes. I have refilled glipizide medication for 1 month but needs to be seen prior to additional refills.

## 2015-07-20 ENCOUNTER — Encounter: Payer: Self-pay | Admitting: Family Medicine

## 2015-07-20 ENCOUNTER — Ambulatory Visit (INDEPENDENT_AMBULATORY_CARE_PROVIDER_SITE_OTHER): Payer: Medicare Other | Admitting: Family Medicine

## 2015-07-20 VITALS — BP 148/82 | HR 71 | Temp 98.7°F | Ht 64.0 in | Wt 144.0 lb

## 2015-07-20 DIAGNOSIS — R1310 Dysphagia, unspecified: Secondary | ICD-10-CM

## 2015-07-20 DIAGNOSIS — E1165 Type 2 diabetes mellitus with hyperglycemia: Secondary | ICD-10-CM

## 2015-07-20 DIAGNOSIS — R413 Other amnesia: Secondary | ICD-10-CM | POA: Diagnosis not present

## 2015-07-20 DIAGNOSIS — K219 Gastro-esophageal reflux disease without esophagitis: Secondary | ICD-10-CM

## 2015-07-20 DIAGNOSIS — I10 Essential (primary) hypertension: Secondary | ICD-10-CM | POA: Diagnosis not present

## 2015-07-20 DIAGNOSIS — E559 Vitamin D deficiency, unspecified: Secondary | ICD-10-CM | POA: Diagnosis not present

## 2015-07-20 DIAGNOSIS — R296 Repeated falls: Secondary | ICD-10-CM

## 2015-07-20 DIAGNOSIS — IMO0001 Reserved for inherently not codable concepts without codable children: Secondary | ICD-10-CM

## 2015-07-20 DIAGNOSIS — I25119 Atherosclerotic heart disease of native coronary artery with unspecified angina pectoris: Secondary | ICD-10-CM

## 2015-07-20 LAB — CBC
HCT: 34.2 % — ABNORMAL LOW (ref 35.0–45.0)
Hemoglobin: 11.5 g/dL — ABNORMAL LOW (ref 11.7–15.5)
MCH: 28.7 pg (ref 27.0–33.0)
MCHC: 33.6 g/dL (ref 32.0–36.0)
MCV: 85.3 fL (ref 80.0–100.0)
MPV: 11.2 fL (ref 7.5–12.5)
Platelets: 221 10*3/uL (ref 140–400)
RBC: 4.01 MIL/uL (ref 3.80–5.10)
RDW: 13.7 % (ref 11.0–15.0)
WBC: 9.7 10*3/uL (ref 3.8–10.8)

## 2015-07-20 LAB — HIV ANTIBODY (ROUTINE TESTING W REFLEX): HIV 1&2 Ab, 4th Generation: NONREACTIVE

## 2015-07-20 LAB — TSH: TSH: 0.53 mIU/L

## 2015-07-20 LAB — FOLATE: Folate: 10.6 ng/mL (ref >5.4)

## 2015-07-20 LAB — POCT GLYCOSYLATED HEMOGLOBIN (HGB A1C): Hemoglobin A1C: 9

## 2015-07-20 LAB — VITAMIN B12: Vitamin B-12: 498 pg/mL (ref 200–1100)

## 2015-07-20 MED ORDER — LEVOCETIRIZINE DIHYDROCHLORIDE 5 MG PO TABS: 5.0000 mg | ORAL_TABLET | Freq: Every evening | ORAL | Status: DC

## 2015-07-20 MED ORDER — TRIAMCINOLONE ACETONIDE 0.1 % EX OINT: 1.0000 "application " | TOPICAL_OINTMENT | Freq: Two times a day (BID) | CUTANEOUS | Status: DC

## 2015-07-20 MED ORDER — GLIPIZIDE 10 MG PO TABS: 10.0000 mg | ORAL_TABLET | Freq: Every day | ORAL | Status: DC

## 2015-07-20 MED ORDER — QUINAPRIL-HYDROCHLOROTHIAZIDE 20-12.5 MG PO TABS: 2.0000 | ORAL_TABLET | Freq: Every day | ORAL | Status: DC

## 2015-07-20 NOTE — Assessment & Plan Note (Signed)
-   Ask to follow-up with her gastroenterologist given the dysphagia persists

## 2015-07-20 NOTE — Progress Notes (Signed)
Patient name: [name] MRN [id number]  Date of birth: 08-11-1945  CC & HPI:  [name] is a 70 y.o. female presenting today for HTN, DM, Dysphagia, Falls and memory difficulties.   CHRONIC HYPERTENSION  BP Readings from Last 3 Encounters:  07/20/15 148/82  01/20/15 136/89  01/19/15 150/90    Control: Good Disease Monitoring  Blood pressure range outside clinc: Not check   Chest pain: no   Dyspnea: no   Claudication: no  Medication compliance: no, No longer taking Coreg or Norvasc  Medication Side Effects: Dizziness    Preventitive Healthcare:   History  Smoking status  . Never Smoker   Smokeless tobacco  . Never Used   DIABETES   Blood Sugar Ranges: not checking  Symptoms of Hypoglycemia? no  Comorbid Symptoms: no Chest pain; no SOB; no Neuropathy: no Vision problems  Dysphagia - Reports that she has continued to have trouble swallowing with occasionally feeling that pills get stuck in her throat - Previously seen by gastroenterologist who did EGD and swallowing study, but reports she never followed up with those results - Says she controls this with Reglan and Nexium when necessary - Denies any pain in the throat, night sweats, weight loss or choking  Falls  - Reports multiple falls in the past year; several of these falls in the past few months - Denies chest pain, palpitations, syncopal episodes - Reports falls are due to loss of balance or tripping - Denies any trees from these falls  Memory difficulties - Reports increasing difficulties with her memory of the past year - Loses items frequently; has lost her keys and got locked out of her house multiple times - She no longer drives - Reports staying with her daughter "and she feels unsafe"  Smoking History Noted  Objective Findings:  Vitals: BP 148/82 mmHg  Pulse 71  Temp(Src) 98.7 F (37.1 C) (Oral)  Ht 5\' 4"  (1.626 m)  Wt 144 lb (65.318 kg)  BMI 24.71 kg/m2  SpO2 99%  Gen: NAD HEENT:  Thyroid nonpalpable; no cervical adenopathy CV: RRR w/o m/r/g, pulses +2 b/l Resp: CTAB w/ normal respiratory effort  Assessment & Plan:   HYPERTENSION, BENIGN ESSENTIAL Blood pressure well controlled today despite she has been off of Coreg for 2 to 3 months and not taking Norvasc.  Did not follow-up with her cardiologist as recommended last visit.  Denies use of nitroglycerin in the past month - Continue quinapril/HCTZ 40/25 daily - Discontinued Coreg and Norvasc as BP wnl without these medication.  - Advised her to call and reschedule appointment with her cardiologist; may need to restart beta blocker if she has a history of CAD   GASTROESOPHAGEAL REFLUX, NO ESOPHAGITIS Continues to have difficulty with reflux and pills sticking in her throat - Previously evaluated by gastroenterologist with EGD and swallow study, but did not follow-up with them afterwards - Advised her to call and schedule appointment with her gastroenterologist; will call if she needs a new referral  Dysphagia - Ask to follow-up with her gastroenterologist given the dysphagia persists  DM (diabetes mellitus), type 2, uncontrolled Continues taking glipizide 10 mg daily.  No longer taking Lantus.  - Not check blood sugar - Ask her to check BG qam daily; f/u in 2 weeks will reassess need for lantus at that time - check LDL, Needs Statin, will address at next visit  Multiple falls Hx of multiple falls in past few months; reportedly due to mechanical balance difficulties. Steppage  gait present in clinic. Never followed through with PT referral last visit for BPPV. Also with history of small meningioma incidentally found on previous brain imaging. Lives alone - Referral to [facility] for fall evaluation - Check Vit D; will start vitamin D 800 units for prevention of falls at next visit - Likely will need re-referral to PT pending Geri evaluation - Consider Brain MRI to reevaluate meningioma  Memory  difficulties Reports difficulty with remembering to take medication and losing items. Stay with daughter "sometimes when she feels unsafe" - Referral to [facility] clinic for memory evaluation / dementia screen - Check TSH, B12, Folate, CBC, CMP, HIV

## 2015-07-20 NOTE — Assessment & Plan Note (Signed)
Hx of multiple falls in past few months; reportedly due to mechanical balance difficulties. Steppage gait present in clinic. Never followed through with PT referral last visit for BPPV. Also with history of small meningioma incidentally found on previous brain imaging. Lives alone - Referral to [facility] clinic for fall evaluation - Check Vit D; will start vitamin D 800 units for prevention of falls at next visit - Likely will need re-referral to PT pending Geri evaluation - Consider Brain MRI to reevaluate meningioma

## 2015-07-20 NOTE — Assessment & Plan Note (Signed)
Blood pressure well controlled today despite she has been off of Coreg for 2 to 3 months and not taking Norvasc.  Did not follow-up with her cardiologist as recommended last visit.  Denies use of nitroglycerin in the past month - Continue quinapril/HCTZ 40/25 daily - Discontinued Coreg and Norvasc as BP wnl without these medication.  - Advised her to call and reschedule appointment with her cardiologist; may need to restart beta blocker if she has a history of CAD

## 2015-07-20 NOTE — Patient Instructions (Signed)
It was great seeing you today.   1. Check your blood sugar every morning prior to taking medications or eating.  Bring a log of your blood sugars to your next appointment in 1-2 weeks 2. Call and schedule appointment with your gastroenterologist for your difficulty swallowing.  3. Call and schedule appointment with your cardiologist for your heart failure 4. Start Xyzal nightly for itching.  5. Apply Vaseline to 3 times a day to dry skin.  May use triamcinolone cream, no more than every other week for extremely dry itchy skin   Please bring all your medications to every doctors visit  Sign up for My Chart to have easy access to your labs results, and communication with your Primary care physician.  Next Appointment  Please make an appointment with Dr Berkley Harvey in 2 weeks for diabetes  Make apt with [facility] for memory and fall evaluation   I look forward to talking with you again at our next visit. If you have any questions or concerns before then, please call the clinic at [phone].  Take Care,   Dr Phill Myron

## 2015-07-20 NOTE — Assessment & Plan Note (Signed)
Reports difficulty with remembering to take medication and losing items. Stay with daughter "sometimes when she feels unsafe" - Referral to [facility] clinic for memory evaluation / dementia screen - Check TSH, B12, Folate, CBC, CMP, HIV

## 2015-07-20 NOTE — Assessment & Plan Note (Addendum)
Continues taking glipizide 10 mg daily.  No longer taking Lantus.  - Not check blood sugar - Ask her to check BG qam daily; f/u in 2 weeks will reassess need for lantus at that time - check LDL, Needs Statin, will address at next visit

## 2015-07-20 NOTE — Assessment & Plan Note (Signed)
Continues to have difficulty with reflux and pills sticking in her throat - Previously evaluated by gastroenterologist with EGD and swallow study, but did not follow-up with them afterwards - Advised her to call and schedule appointment with her gastroenterologist; will call if she needs a new referral

## 2015-07-21 LAB — COMPLETE METABOLIC PANEL WITH GFR
ALT: 11 U/L (ref 6–29)
AST: 13 U/L (ref 10–35)
Albumin: 4.2 g/dL (ref 3.6–5.1)
Alkaline Phosphatase: 61 U/L (ref 33–130)
BUN: 29 mg/dL — ABNORMAL HIGH (ref 7–25)
CO2: 28 mmol/L (ref 20–31)
Calcium: 10.2 mg/dL (ref 8.6–10.4)
Chloride: 100 mmol/L (ref 98–110)
Creat: 1.06 mg/dL — ABNORMAL HIGH (ref 0.50–0.99)
GFR, Est African American: 62 mL/min (ref >=60)
GFR, Est Non African American: 54 mL/min — ABNORMAL LOW (ref >=60)
Glucose, Bld: 144 mg/dL — ABNORMAL HIGH (ref 65–99)
Potassium: 3.3 mmol/L — ABNORMAL LOW (ref 3.5–5.3)
Sodium: 139 mmol/L (ref 135–146)
Total Bilirubin: 0.5 mg/dL (ref 0.2–1.2)
Total Protein: 7.4 g/dL (ref 6.1–8.1)

## 2015-07-21 LAB — VITAMIN D 25 HYDROXY (VIT D DEFICIENCY, FRACTURES): Vit D, 25-Hydroxy: 11 ng/mL — ABNORMAL LOW (ref 30–100)

## 2015-07-21 LAB — LIPID PANEL
Cholesterol: 164 mg/dL (ref 125–200)
HDL: 43 mg/dL — ABNORMAL LOW (ref >=46)
LDL Cholesterol: 47 mg/dL (ref <130)
Total CHOL/HDL Ratio: 3.8 Ratio (ref <=5.0)
Triglycerides: 369 mg/dL — ABNORMAL HIGH (ref <150)
VLDL: 74 mg/dL — ABNORMAL HIGH (ref <30)

## 2015-08-03 ENCOUNTER — Ambulatory Visit (INDEPENDENT_AMBULATORY_CARE_PROVIDER_SITE_OTHER): Payer: Medicare Other | Admitting: Family Medicine

## 2015-08-03 ENCOUNTER — Encounter: Payer: Self-pay | Admitting: Family Medicine

## 2015-08-03 VITALS — BP 155/63 | HR 66 | Temp 98.6°F | Ht 64.0 in | Wt 148.5 lb

## 2015-08-03 DIAGNOSIS — E1165 Type 2 diabetes mellitus with hyperglycemia: Secondary | ICD-10-CM

## 2015-08-03 DIAGNOSIS — D320 Benign neoplasm of cerebral meninges: Secondary | ICD-10-CM | POA: Diagnosis not present

## 2015-08-03 DIAGNOSIS — IMO0001 Reserved for inherently not codable concepts without codable children: Secondary | ICD-10-CM

## 2015-08-03 DIAGNOSIS — R1310 Dysphagia, unspecified: Secondary | ICD-10-CM | POA: Diagnosis not present

## 2015-08-03 DIAGNOSIS — E559 Vitamin D deficiency, unspecified: Secondary | ICD-10-CM | POA: Insufficient documentation

## 2015-08-03 DIAGNOSIS — K219 Gastro-esophageal reflux disease without esophagitis: Secondary | ICD-10-CM

## 2015-08-03 MED ORDER — VITAMIN D (ERGOCALCIFEROL) 1.25 MG (50000 UNIT) PO CAPS: 50000.0000 [IU] | ORAL_CAPSULE | ORAL | Status: DC

## 2015-08-03 MED ORDER — ESOMEPRAZOLE MAGNESIUM 40 MG PO PACK: 40.0000 mg | PACK | Freq: Every day | ORAL | Status: DC

## 2015-08-03 MED ORDER — METOCLOPRAMIDE HCL 5 MG PO TABS: 5.0000 mg | ORAL_TABLET | Freq: Three times a day (TID) | ORAL | Status: DC

## 2015-08-03 NOTE — Patient Instructions (Addendum)
Call and let me know what your blood sugars have been running in the morning.  Continue to check your blood sugars in the morning prior to eating or taking medications.  - Continue glipizide 10 mg every morning - Lipitor 40 mg every evening - follow-up with Dr Berkley Harvey in 1 month for diabetes  Continue to work on schedule appointment with your gastroenterologist - Restart Nexium 40 mg every morning - Restart Reglan 5 mg 3 times a day prior to meals  Make an appointment with Dr. McDiarmid for evaluation of her memory - I have ordered an MRI of your brain to help reassess the meningioma and your memory problems  Start Vitamin D 50,000units weekly for 2-3 months

## 2015-08-04 ENCOUNTER — Telehealth: Payer: Self-pay

## 2015-08-04 NOTE — Telephone Encounter (Signed)
I called pt this morning to inform her of MRI appointment she has at [facility] on 08/11/2015 @4 :00p. Pt voiced understanding.

## 2015-08-06 NOTE — Progress Notes (Signed)
  Patient name: [name] MRN [id number]  Date of birth: 1945-12-13  CC & HPI:  [name] is a 70 y.o. female presenting today for diabetes, dysphagia and memory difficulties.   DIABETES   Blood Sugar Ranges: Checking BGs but forgot meter  Symptoms of Hyperglycemia? no  Symptoms of Hypoglycemia? no  Comorbid Symptoms:   Chest pain: no; SOB: no   Neuropathy: denies3   Vision problems: denies  Medication Compliance: yes   Medication Side Effects: No  Dysphagia - Continues to report difficulty swallowing, but denies choking with solids or liquids.  Reports pills often get stuck - Has follow-up with gastroenterologist - Reports previous improvement with Reglan and PPI  Memory difficulties - Continues to report difficulties remembering to take medications and repeating herself during conversations  - Has appointment scheduled with geriatric clinic for memory assessment  Smoking History Noted  Objective Findings:  Vitals: BP 155/63 mmHg  Pulse 66  Temp(Src) 98.6 F (37 C) (Oral)  Ht 5\' 4"  (1.626 m)  Wt 148 lb 8 oz (67.359 kg)  BMI 25.48 kg/m2  SpO2 100%  Gen: NAD CV: RRR w/o m/r/g, pulses +2 b/l Resp: CTAB w/ normal respiratory effort  Assessment & Plan:   DM (diabetes mellitus), type 2, uncontrolled Recommended she check her blood sugars every morning, and call and report these numbers when she has 5-7 readings - Continue glipizide 10 mg daily; consider titration to twice a day, or switching to extended release formula if blood sugars uncontrolled - f/u in 2-4 weeks  Dysphagia Has follow with GI arranged - Restart Nexium 40 mg daily and Reglan 5 mg 3 times a day with meals  MENINGIOMA Given history of meningioma and worsening dysphagia and memory difficulties- Will repeat MRI  Hypovitaminosis D 50,000 units vitamin D weekly  3 months

## 2015-08-06 NOTE — Assessment & Plan Note (Signed)
Recommended she check her blood sugars every morning, and call and report these numbers when she has 5-7 readings - Continue glipizide 10 mg daily; consider titration to twice a day, or switching to extended release formula if blood sugars uncontrolled - f/u in 2-4 weeks

## 2015-08-06 NOTE — Assessment & Plan Note (Signed)
50,000 units vitamin D weekly  3 months

## 2015-08-06 NOTE — Assessment & Plan Note (Signed)
Has follow with GI arranged - Restart Nexium 40 mg daily and Reglan 5 mg 3 times a day with meals

## 2015-08-06 NOTE — Assessment & Plan Note (Signed)
Given history of meningioma and worsening dysphagia and memory difficulties- Will repeat MRI

## 2015-08-11 ENCOUNTER — Ambulatory Visit (HOSPITAL_COMMUNITY)
Admission: RE | Admit: 2015-08-11 | Discharge: 2015-08-11 | Disposition: A | Discharge status: Home / Self Care | Payer: Medicare Other | Source: Ambulatory Visit | Attending: Family Medicine | Admitting: Family Medicine

## 2015-08-11 DIAGNOSIS — D320 Benign neoplasm of cerebral meninges: Secondary | ICD-10-CM

## 2015-08-11 DIAGNOSIS — F0390 Unspecified dementia without behavioral disturbance: Secondary | ICD-10-CM | POA: Diagnosis present

## 2015-08-11 DIAGNOSIS — I6782 Cerebral ischemia: Secondary | ICD-10-CM | POA: Insufficient documentation

## 2015-08-11 DIAGNOSIS — R413 Other amnesia: Secondary | ICD-10-CM | POA: Diagnosis not present

## 2015-08-12 ENCOUNTER — Telehealth: Payer: Self-pay | Admitting: Family Medicine

## 2015-08-12 NOTE — Telephone Encounter (Signed)
Call to discuss results of MRI; no acute changes, meningioma again noted.  - She has follow-up was scheduled for 5/24 - Attempt to get into geriatric clinic for cognitive assessment at earliest convenience

## 2015-09-02 ENCOUNTER — Encounter: Payer: Self-pay | Admitting: Family Medicine

## 2015-09-02 ENCOUNTER — Telehealth: Payer: Self-pay | Admitting: Internal Medicine

## 2015-09-02 ENCOUNTER — Ambulatory Visit (INDEPENDENT_AMBULATORY_CARE_PROVIDER_SITE_OTHER): Payer: Medicare Other | Admitting: Family Medicine

## 2015-09-02 VITALS — BP 164/66 | HR 71 | Temp 98.7°F | Ht 64.0 in | Wt 147.0 lb

## 2015-09-02 DIAGNOSIS — R1310 Dysphagia, unspecified: Secondary | ICD-10-CM

## 2015-09-02 MED ORDER — ESOMEPRAZOLE MAGNESIUM 40 MG PO PACK: 40.0000 mg | PACK | Freq: Every day | ORAL | Status: DC

## 2015-09-02 NOTE — Telephone Encounter (Signed)
Dr. Berkley Harvey called to report patient can't swallow any solids.  Unable to swallow medications. He wants her seen prior to the next available 6/12.  Do you want to do direct EGD dil?  Dr. Carlean Purl please advise.

## 2015-09-02 NOTE — Telephone Encounter (Signed)
She reports that did have some improvement.  She reports now that "even water I just strangle on it" .  She reports not able to swallow much at all

## 2015-09-02 NOTE — Patient Instructions (Signed)
I will call your gastroenterologist and see if we can get you scheduled for your evaluation and procedure this week.  If you have not heard from your gastroenterologist or our clinic in the next 2 days call us back.

## 2015-09-02 NOTE — Telephone Encounter (Signed)
Chart reviewed   Sounds similar to what I worked her up for in past  Did she get any better after I dilated her last year?

## 2015-09-02 NOTE — Progress Notes (Signed)
  Patient name: [name] MRN [id number]  Date of birth: Sep 04, 1945  CC & HPI:  [name] is a 70 y.o. female presenting today for Dysphasia.  She reports worsening of her dysphasia since her last visit.  She has contacted her gastroenterologist, but says they are unable to see her until July.  She now reports difficulty with all solid foods and sticking sensation in her esophagus.  Difficulty swallowing pills but also gets stuck in her throat.  She is drinking liquids and smoothies.  Reports occasional vomiting with eating and drinking; vomiting up undigested food.  Reports 5 temporal.  Weight loss since October when she had her esophageal stricture stretched.  Also has a history of fundoplication.  She has been taking Nexium but has difficulty swallowing the pills.   Smoking History Noted  Objective Findings:  Vitals: BP 164/66 mmHg  Pulse 71  Temp(Src) 98.7 F (37.1 C) (Oral)  Ht 5\' 4"  (1.626 m)  Wt 147 lb (66.679 kg)  BMI 25.22 kg/m2  SpO2 100%  Gen: NAD HEENT: Mild pharyngeal erythema CV: RRR w/o m/r/g, pulses +2 b/l Resp: CTAB w/ normal respiratory effort  Assessment & Plan:   Dysphagia She reports worsening of her dysphagia over the past several weeks and is now unable to swallow solid foods.  Also reports difficulty with swallowing pills as these is stuck in her throat for several hours.  Reports several episodes of vomiting and sometimes vomiting up undigested food concerning for esophageal stricture.  She has appointment with GI in July. - Called GI and she is now scheduled for endoscopy 5/26.  - Switch Nexium pills to Nexium powder

## 2015-09-02 NOTE — Telephone Encounter (Signed)
Have her do an upper GI series by Friday please - tomorrow would be better. Dysphagia, s/p fundoplication  Have them give a tablet also  Thanks

## 2015-09-02 NOTE — Assessment & Plan Note (Addendum)
She reports worsening of her dysphagia over the past several weeks and is now unable to swallow solid foods.  Also reports difficulty with swallowing pills as these is stuck in her throat for several hours.  Reports several episodes of vomiting and sometimes vomiting up undigested food concerning for esophageal stricture.  She has appointment with GI in July. - Called GI and she is now scheduled for endoscopy 5/26.  - Switch Nexium pills to Nexium powder

## 2015-09-03 NOTE — Telephone Encounter (Signed)
Patient is scheduled for tomorrow at [facility] for UGI with tablet.  She is aware to arrive at 9:45 in radiology and be NPO after midnight.

## 2015-09-04 ENCOUNTER — Ambulatory Visit (HOSPITAL_COMMUNITY)
Admission: RE | Admit: 2015-09-04 | Discharge: 2015-09-04 | Disposition: A | Discharge status: Home / Self Care | Payer: Medicare Other | Source: Ambulatory Visit | Attending: Internal Medicine | Admitting: Internal Medicine

## 2015-09-04 DIAGNOSIS — K219 Gastro-esophageal reflux disease without esophagitis: Secondary | ICD-10-CM | POA: Diagnosis not present

## 2015-09-04 DIAGNOSIS — K449 Diaphragmatic hernia without obstruction or gangrene: Secondary | ICD-10-CM | POA: Diagnosis not present

## 2015-09-04 DIAGNOSIS — R1310 Dysphagia, unspecified: Secondary | ICD-10-CM | POA: Diagnosis not present

## 2015-09-04 MED ORDER — MAGNESIUM HYDROXIDE 400 MG/5ML PO SUSP
ORAL | Status: AC
  Filled 2015-09-04: qty 30

## 2015-09-07 NOTE — Progress Notes (Signed)
Quick Note:  Tablet did not pass easily through GE junction See if she can do EGD/dilation at [facility] 0730 6/1 ______

## 2015-09-08 ENCOUNTER — Other Ambulatory Visit: Payer: Self-pay

## 2015-09-08 DIAGNOSIS — R1310 Dysphagia, unspecified: Secondary | ICD-10-CM

## 2015-09-10 ENCOUNTER — Ambulatory Visit (AMBULATORY_SURGERY_CENTER): Payer: Medicare Other | Admitting: Internal Medicine

## 2015-09-10 ENCOUNTER — Encounter: Payer: Self-pay | Admitting: Internal Medicine

## 2015-09-10 VITALS — BP 135/63 | HR 52 | Temp 96.0°F | Resp 14 | Ht 64.0 in | Wt 154.0 lb

## 2015-09-10 DIAGNOSIS — I10 Essential (primary) hypertension: Secondary | ICD-10-CM | POA: Diagnosis not present

## 2015-09-10 DIAGNOSIS — R1314 Dysphagia, pharyngoesophageal phase: Secondary | ICD-10-CM

## 2015-09-10 DIAGNOSIS — R1319 Other dysphagia: Secondary | ICD-10-CM

## 2015-09-10 DIAGNOSIS — R1310 Dysphagia, unspecified: Secondary | ICD-10-CM

## 2015-09-10 LAB — GLUCOSE, CAPILLARY
Glucose-Capillary: 122 mg/dL — ABNORMAL HIGH (ref 65–99)
Glucose-Capillary: 129 mg/dL — ABNORMAL HIGH (ref 65–99)

## 2015-09-10 MED ORDER — SODIUM CHLORIDE 0.9 % IV SOLN
500.0000 mL | INTRAVENOUS | Status: DC
Start: 2015-09-10 — End: 2015-09-10

## 2015-09-10 NOTE — Patient Instructions (Addendum)
   I dilated the esophagus.  Please call back next week and let me know how you are swallowing and we will plan next step(s).  I appreciate the opportunity to care for you. Gatha Mayer, MD, FACG  YOU HAD AN ENDOSCOPIC PROCEDURE TODAY AT [facility]:   Refer to the procedure report that was given to you for any specific questions about what was found during the examination.  If the procedure report does not answer your questions, please call your gastroenterologist to clarify.  If you requested that your care partner not be given the details of your procedure findings, then the procedure report has been included in a sealed envelope for you to review at your convenience later.  YOU SHOULD EXPECT: Some feelings of bloating in the abdomen. Passage of more gas than usual.  Walking can help get rid of the air that was put into your GI tract during the procedure and reduce the bloating. If you had a lower endoscopy (such as a colonoscopy or flexible sigmoidoscopy) you may notice spotting of blood in your stool or on the toilet paper. If you underwent a bowel prep for your procedure, you may not have a normal bowel movement for a few days.  Please Note:  You might notice some irritation and congestion in your nose or some drainage.  This is from the oxygen used during your procedure.  There is no need for concern and it should clear up in a day or so.  SYMPTOMS TO REPORT IMMEDIATELY:     Following upper endoscopy (EGD)  Vomiting of blood or coffee ground material  New chest pain or pain under the shoulder blades  Painful or persistently difficult swallowing  New shortness of breath  Fever of 100F or higher  Black, tarry-looking stools  For urgent or emergent issues, a gastroenterologist can be reached at any hour by calling [phone].   DIET: FOLLOW DILATATION DIET TODAY   ACTIVITY:  You should plan to take it easy for the rest of today and you should NOT  DRIVE or use heavy machinery until tomorrow (because of the sedation medicines used during the test).    FOLLOW UP: Our staff will call the number listed on your records the next business day following your procedure to check on you and address any questions or concerns that you may have regarding the information given to you following your procedure. If we do not reach you, we will leave a message.  However, if you are feeling well and you are not experiencing any problems, there is no need to return our call.  We will assume that you have returned to your regular daily activities without incident.  If any biopsies were taken you will be contacted by phone or by letter within the next 1-3 weeks.  Please call us at [phone] if you have not heard about the biopsies in 3 weeks.    SIGNATURES/CONFIDENTIALITY: You and/or your care partner have signed paperwork which will be entered into your electronic medical record.  These signatures attest to the fact that that the information above on your After Visit Summary has been reviewed and is understood.  Full responsibility of the confidentiality of this discharge information lies with you and/or your care-partner.  FOLLOW DILATATION DIET TODAY (GIVEN TO YOU)  Call Dr. Celesta Aver office in one week and let Dr Carlean Purl know if swallowing is better  Continue current medications

## 2015-09-10 NOTE — Progress Notes (Signed)
Called to room to assist during endoscopic procedure.  Patient ID and intended procedure confirmed with present staff. Received instructions for my participation in the procedure from the performing physician.  

## 2015-09-10 NOTE — Progress Notes (Signed)
To pacu vss patent aw reprot to rn 

## 2015-09-10 NOTE — Op Note (Signed)
[facility] Patient Name: [name] Procedure Date: 09/10/2015 9:14 AM MRN: [id number] Endoscopist: Gatha Mayer , MD Age: 70 Referring MD:  Date of Birth: 06/17/1945 Gender: Female Procedure:                Upper GI endoscopy Indications:              Therapeutic procedure, Dysphagia, Abnormal UGI                            series Medicines:                Propofol per Anesthesia, Monitored Anesthesia Care Procedure:                Pre-Anesthesia Assessment:                           - Prior to the procedure, a History and Physical                            was performed, and patient medications and                            allergies were reviewed. The patient's tolerance of                            previous anesthesia was also reviewed. The risks                            and benefits of the procedure and the sedation                            options and risks were discussed with the patient.                            All questions were answered, and informed consent                            was obtained. Prior Anticoagulants: The patient                            last took aspirin 1 day prior to the procedure. ASA                            Grade Assessment: III - A patient with severe                            systemic disease. After reviewing the risks and                            benefits, the patient was deemed in satisfactory                            condition to undergo the procedure.  After obtaining informed consent, the endoscope was                            passed under direct vision. Throughout the                            procedure, the patient's blood pressure, pulse, and                            oxygen saturations were monitored continuously. The                            Model GIF-[id number]) scope was introduced                            through the mouth, and advanced to the prepyloric                     region, stomach. The upper GI endoscopy was                            accomplished without difficulty. The patient                            tolerated the procedure well. Scope In: Scope Out: Findings:                 One mild stenosis was found at the gastroesophageal                            junction. And was traversed. A TTS dilator was                            passed through the scope. Dilation with an 18-19-20                            mm balloon dilator was performed to 20 mm. The                            dilation site was examined and showed mild                            improvement in luminal narrowing. Estimated blood                            loss: none.                           Evidence of a Nissen fundoplication was found in                            the cardia. The wrap appeared intact. This was                            traversed.  The exam was otherwise without abnormality. Complications:            No immediate complications. Estimated Blood Loss:     Estimated blood loss: none. 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 Dilated 20 mm.                           - A Nissen fundoplication was found. The wrap                            appears intact.                           - The examination was otherwise normal to stomach                           - No specimens collected. Recommendation:           - Patient has a contact number available for                            emergencies. The signs and symptoms of potential                            delayed complications were discussed with the                            patient. Return to normal activities tomorrow.                            Written discharge instructions were provided to the                             patient.                           - Clear liquids x 1 hour then soft foods rest of                            day. Start prior diet tomorrow.                           - Continue present medications.                           - Telephone my office in 1 week.                           - report how she is doing with swallowing -  same -                            better - worse.                           May need repeat dilation as she got benefit x 1-2                            months after last - presume scar tissue vss                            anatomical issue post-fundoplication here - ? needs                            larger dilation if that is even possible.                           manometry is ok.                           She reported improvement on metaclopramide and PPI                            but thinking of dc/decrease in those pending                            symptom report. Gatha Mayer, MD 09/10/2015 9:53:40 AM This report has been signed electronically.

## 2015-09-11 ENCOUNTER — Telehealth: Payer: Self-pay

## 2015-09-11 NOTE — Telephone Encounter (Signed)
  Follow up Call-  Call back number 09/10/2015  Post procedure Call Back phone  # [phone]  Permission to leave phone message Yes     Patient questions:  Do you have a fever, pain , or abdominal swelling? No. Pain Score  0 *  Have you tolerated food without any problems? Yes.    Have you been able to return to your normal activities? Yes.    Do you have any questions about your discharge instructions: Diet   No. Medications  No. Follow up visit  No.  Do you have questions or concerns about your Care? No.  Actions: * If pain score is 4 or above: No action needed, pain <4.

## 2015-09-22 ENCOUNTER — Other Ambulatory Visit: Payer: Self-pay | Admitting: *Deleted

## 2015-09-22 DIAGNOSIS — IMO0001 Reserved for inherently not codable concepts without codable children: Secondary | ICD-10-CM

## 2015-09-22 DIAGNOSIS — E559 Vitamin D deficiency, unspecified: Secondary | ICD-10-CM

## 2015-09-22 DIAGNOSIS — E1165 Type 2 diabetes mellitus with hyperglycemia: Principal | ICD-10-CM

## 2015-09-22 MED ORDER — GLIPIZIDE 10 MG PO TABS: 10.0000 mg | ORAL_TABLET | Freq: Every day | ORAL | Status: DC

## 2015-10-07 ENCOUNTER — Emergency Department (HOSPITAL_COMMUNITY): Payer: Medicare Other

## 2015-10-07 ENCOUNTER — Emergency Department (HOSPITAL_COMMUNITY)
Admission: EM | Admit: 2015-10-07 | Discharge: 2015-10-07 | Disposition: A | Discharge status: Home / Self Care | Payer: Medicare Other | Attending: Emergency Medicine | Admitting: Emergency Medicine

## 2015-10-07 ENCOUNTER — Encounter (HOSPITAL_COMMUNITY): Payer: Self-pay

## 2015-10-07 DIAGNOSIS — Z85038 Personal history of other malignant neoplasm of large intestine: Secondary | ICD-10-CM | POA: Diagnosis not present

## 2015-10-07 DIAGNOSIS — I10 Essential (primary) hypertension: Secondary | ICD-10-CM | POA: Insufficient documentation

## 2015-10-07 DIAGNOSIS — Z7982 Long term (current) use of aspirin: Secondary | ICD-10-CM | POA: Insufficient documentation

## 2015-10-07 DIAGNOSIS — Y939 Activity, unspecified: Secondary | ICD-10-CM | POA: Diagnosis not present

## 2015-10-07 DIAGNOSIS — Z7984 Long term (current) use of oral hypoglycemic drugs: Secondary | ICD-10-CM | POA: Diagnosis not present

## 2015-10-07 DIAGNOSIS — J45909 Unspecified asthma, uncomplicated: Secondary | ICD-10-CM | POA: Diagnosis not present

## 2015-10-07 DIAGNOSIS — M79601 Pain in right arm: Secondary | ICD-10-CM | POA: Insufficient documentation

## 2015-10-07 DIAGNOSIS — W19XXXA Unspecified fall, initial encounter: Secondary | ICD-10-CM | POA: Insufficient documentation

## 2015-10-07 DIAGNOSIS — Y929 Unspecified place or not applicable: Secondary | ICD-10-CM | POA: Insufficient documentation

## 2015-10-07 DIAGNOSIS — S4991XA Unspecified injury of right shoulder and upper arm, initial encounter: Secondary | ICD-10-CM | POA: Diagnosis not present

## 2015-10-07 DIAGNOSIS — M25511 Pain in right shoulder: Secondary | ICD-10-CM | POA: Diagnosis not present

## 2015-10-07 DIAGNOSIS — Y999 Unspecified external cause status: Secondary | ICD-10-CM | POA: Diagnosis not present

## 2015-10-07 DIAGNOSIS — M79621 Pain in right upper arm: Secondary | ICD-10-CM | POA: Diagnosis not present

## 2015-10-07 DIAGNOSIS — E119 Type 2 diabetes mellitus without complications: Secondary | ICD-10-CM | POA: Diagnosis not present

## 2015-10-07 DIAGNOSIS — M79603 Pain in arm, unspecified: Secondary | ICD-10-CM

## 2015-10-07 DIAGNOSIS — Z79899 Other long term (current) drug therapy: Secondary | ICD-10-CM | POA: Diagnosis not present

## 2015-10-07 MED ORDER — METHOCARBAMOL 500 MG PO TABS
500.0000 mg | ORAL_TABLET | Freq: Once | ORAL | Status: AC
  Administered 2015-10-07: 500 mg via ORAL
  Filled 2015-10-07: qty 1

## 2015-10-07 MED ORDER — METHOCARBAMOL 500 MG PO TABS: 500.0000 mg | ORAL_TABLET | Freq: Three times a day (TID) | ORAL | Status: DC | PRN

## 2015-10-07 MED ORDER — KETOROLAC TROMETHAMINE 60 MG/2ML IM SOLN
15.0000 mg | Freq: Once | INTRAMUSCULAR | Status: AC
  Administered 2015-10-07: 15 mg via INTRAMUSCULAR
  Filled 2015-10-07: qty 2

## 2015-10-07 MED ORDER — HYDROCODONE-ACETAMINOPHEN 5-325 MG PO TABS
1.0000 | ORAL_TABLET | Freq: Once | ORAL | Status: AC
  Administered 2015-10-07: 1 via ORAL
  Filled 2015-10-07: qty 1

## 2015-10-07 MED ORDER — IBUPROFEN 200 MG PO TABS: 400.0000 mg | ORAL_TABLET | Freq: Four times a day (QID) | ORAL | Status: DC | PRN

## 2015-10-07 NOTE — ED Notes (Signed)
Patient fell 2 weeks ago and has ben experiencing right shoulder and humerus pain x 5 days, pain with any movement

## 2015-10-07 NOTE — ED Notes (Signed)
Pain is better in her rt upper arm

## 2015-10-07 NOTE — ED Notes (Signed)
Pt taken to CT.

## 2015-10-07 NOTE — ED Provider Notes (Signed)
CSN: [id number]     Arrival date & time 10/07/15  1007 History   First MD Initiated Contact with Patient 10/07/15 1120     No chief complaint on file.    (Consider location/radiation/quality/duration/timing/severity/associated sxs/prior Treatment) Patient is a 70 y.o. female presenting with shoulder pain.  Shoulder Pain Location:  Shoulder Injury: yes   Mechanism of injury: fall   Shoulder location:  R shoulder Pain details:    Quality:  Aching and sharp   Radiates to:  Does not radiate   Severity:  Mild   Onset quality:  Sudden   Duration:  1 week   Timing:  Intermittent Handedness:  Right-handed Prior injury to area:  No Relieved by:  None tried Worsened by:  Nothing tried Ineffective treatments:  None tried Associated symptoms: no back pain, no neck pain, no numbness and no swelling     Past Medical History  Diagnosis Date  . Colon cancer [facility]) Nov. 27,  2006  . Hypertension   . GERD (gastroesophageal reflux disease)   . Diabetes mellitus     Type 2  . Hypercholesterolemia   . Gastritis   . Brain tumor (benign) ([facility])   . Female bladder prolapse   . Allergy   . Asthma   . Glaucoma    Past Surgical History  Procedure Laterality Date  . Right colectomy  Nov. 27, 2006  . Cardiac catheterization  2005  . Tmj arthroplasty    . Laparoscopic nissen fundoplication  0000000  . Repair prolapsedbladder    . Abdominal hysterectomy  1976  . Colonoscopy w/ biopsies    . Upper gastrointestinal endoscopy    . Bladder tac    . Esophageal manometry N/A 03/20/2015    Procedure: ESOPHAGEAL MANOMETRY (EM);  Surgeon: Gatha Mayer, MD;  Location: [facility];  Service: Endoscopy;  Laterality: N/A;   Family History  Problem Relation Age of Onset  . Lymphoma Other   . Colon cancer Neg Hx   . Stomach cancer Neg Hx   . Bone cancer Father   . Diabetes Daughter   . Hypertension Daughter   . Heart attack Daughter   . Suicidality Son     deceased   Social History  Substance  Use Topics  . Smoking status: Never Smoker   . Smokeless tobacco: Never Used  . Alcohol Use: No   OB History    No data available     Review of Systems  Musculoskeletal: Negative for back pain and neck pain.  All other systems reviewed and are negative.     Allergies  Metformin and related  Home Medications   Prior to Admission medications   Medication Sig Start Date End Date Taking? Authorizing Provider  amLODipine (NORVASC) 2.5 MG tablet Take 2.5 mg by mouth daily. Reported on 07/20/2015 11/17/14  Yes Historical Provider, MD  aspirin 81 MG chewable tablet Chew 81 mg by mouth daily.     Yes Historical Provider, MD  atorvastatin (LIPITOR) 40 MG tablet Take 1 tablet (40 mg total) by mouth daily. 01/14/15  Yes Olam Idler, MD  esomeprazole (NEXIUM) 40 MG packet Take 40 mg by mouth daily before breakfast. 09/02/15  Yes Olam Idler, MD  glipiZIDE (GLUCOTROL) 10 MG tablet Take 1 tablet (10 mg total) by mouth daily before breakfast. 09/22/15  Yes Olam Idler, MD  levocetirizine (XYZAL) 5 MG tablet Take 1 tablet (5 mg total) by mouth every evening. 07/20/15  Yes Olam Idler, MD  metoCLOPramide (REGLAN) 5 MG tablet Take 1 tablet (5 mg total) by mouth 3 (three) times daily before meals. 08/03/15  Yes Olam Idler, MD  nitroGLYCERIN (NITROSTAT) 0.4 MG SL tablet Place 1 tablet (0.4 mg total) under the tongue every 5 (five) minutes as needed. If chest pain not relieved by third tab call 911. 11/24/14  Yes Olam Idler, MD  quinapril-hydrochlorothiazide (ACCURETIC) 20-12.5 MG tablet Take 2 tablets by mouth daily. 07/20/15  Yes Olam Idler, MD  Travoprost, BAK Free, (TRAVATAN) 0.004 % SOLN ophthalmic solution Place 1 drop into both eyes at bedtime.   Yes Historical Provider, MD  triamcinolone ointment (KENALOG) 0.1 % Apply 1 application topically 2 (two) times daily. 07/20/15  Yes Olam Idler, MD  Vitamin D, Ergocalciferol, (DRISDOL) 50000 units CAPS capsule Take 1 capsule (50,000  Units total) by mouth every 7 (seven) days. 08/03/15  Yes Olam Idler, MD  glucose blood test strip Check sugars three times daily 07/16/14   Sharon [address], MD  ibuprofen (ADVIL,MOTRIN) 200 MG tablet Take 2 tablets (400 mg total) by mouth every 6 (six) hours as needed (pain). 10/07/15   Merrily Pew, MD  methocarbamol (ROBAXIN) 500 MG tablet Take 1 tablet (500 mg total) by mouth every 8 (eight) hours as needed for muscle spasms. 10/07/15   Corene Cornea Landrum Carbonell, MD   BP 135/51 mmHg  Pulse 57  Temp(Src) 98.4 F (36.9 C) (Oral)  Resp 14  SpO2 100% Physical Exam  Constitutional: She appears well-developed and well-nourished.  HENT:  Head: Normocephalic and atraumatic.  Neck: Normal range of motion.  Cardiovascular: Normal rate and regular rhythm.   Pulmonary/Chest: No stridor. No respiratory distress.  Abdominal: Soft. She exhibits no distension. There is no tenderness.  Musculoskeletal: She exhibits tenderness (pain with palpation of anterior shoulder).  Neurological: She is alert.  Skin: Skin is warm and dry.  Nursing note and vitals reviewed.   ED Course  Procedures (including critical care time) Labs Review Labs Reviewed - No data to display  Imaging Review Dg Shoulder Right  10/07/2015  CLINICAL DATA:  Fall.  Right shoulder and arm pain. EXAM: RIGHT SHOULDER - 2+ VIEW COMPARISON:  None. FINDINGS: Mild degenerative changes in the right [facility] joint with spurring. No acute bony abnormality. Specifically, no fracture, subluxation, or dislocation. Soft tissues are intact. IMPRESSION: No acute bony abnormality. Electronically Signed   By: Rolm Baptise M.D.   On: 10/07/2015 11:09   Ct Humerus Right Wo Contrast  10/07/2015  CLINICAL DATA:  Right shoulder to humerus pain. The patient fell 2 weeks ago. EXAM: CT OF THE RIGHT SHOULDER WITHOUT CONTRAST; CT OF THE RIGHT HUMERUS WITHOUT CONTRAST TECHNIQUE: Multidetector CT imaging was performed according to the standard protocol. Multiplanar CT image  reconstructions were also generated. COMPARISON:  Radiographs dated 10/07/2015 FINDINGS: CT scan of the right shoulder: There is no fracture or dislocation. There is moderate AC joint arthropathy. Normal type 2 acromion. Small marginal osteophytes in the lateral aspect of the acromion. No appreciable bursitis. Glenohumeral joint appears normal. No appreciable joint effusion. The muscles of the rotator cuff appear normal with no atrophy. CT scan of the humerus: The visualized portion of the humerus is normal. The distal humeral epicondyles are not included on the CT scan but appeared completely normal on the radiographs of 10/07/2015. The muscles of the right upper arm are normal. Biceps tendon appears properly located and intact. IMPRESSION: 1. CT scan of the shoulder: No acute abnormality. Moderate AC joint  arthropathy. 2. CT scan of the humerus:  Negative. Electronically Signed   By: Lorriane Shire M.D.   On: 10/07/2015 14:06   Ct Shoulder Right Wo Contrast  10/07/2015  CLINICAL DATA:  Right shoulder to humerus pain. The patient fell 2 weeks ago. EXAM: CT OF THE RIGHT SHOULDER WITHOUT CONTRAST; CT OF THE RIGHT HUMERUS WITHOUT CONTRAST TECHNIQUE: Multidetector CT imaging was performed according to the standard protocol. Multiplanar CT image reconstructions were also generated. COMPARISON:  Radiographs dated 10/07/2015 FINDINGS: CT scan of the right shoulder: There is no fracture or dislocation. There is moderate AC joint arthropathy. Normal type 2 acromion. Small marginal osteophytes in the lateral aspect of the acromion. No appreciable bursitis. Glenohumeral joint appears normal. No appreciable joint effusion. The muscles of the rotator cuff appear normal with no atrophy. CT scan of the humerus: The visualized portion of the humerus is normal. The distal humeral epicondyles are not included on the CT scan but appeared completely normal on the radiographs of 10/07/2015. The muscles of the right upper arm are  normal. Biceps tendon appears properly located and intact. IMPRESSION: 1. CT scan of the shoulder: No acute abnormality. Moderate AC joint arthropathy. 2. CT scan of the humerus:  Negative. Electronically Signed   By: Lorriane Shire M.D.   On: 10/07/2015 14:06   Dg Humerus Right  10/07/2015  CLINICAL DATA:  Fall.  Right arm pain. EXAM: RIGHT HUMERUS - 2+ VIEW COMPARISON:  Right shoulder performed today. FINDINGS: Degenerative changes in the right [facility] joint. No acute bony abnormality. Specifically, no fracture, subluxation, or dislocation. Soft tissues are intact. IMPRESSION: No acute bony abnormality. Electronically Signed   By: Rolm Baptise M.D.   On: 10/07/2015 11:09   I have personally reviewed and evaluated these images and lab results as part of my medical decision-making.   EKG Interpretation None      MDM   Final diagnoses:  Arm pain  Right shoulder pain    70 year old female with persistent right shoulder pain after a fall. X-ray and CT negative here. Suspect possible muscular injury such as rotator cuff. Will follow with her primary doctor plus or minus orthopedics as deemed necessary by her primary doctor. Short course of muscle relaxers given.   New Prescriptions: Discharge Medication List as of 10/07/2015  3:40 PM    START taking these medications   Details  methocarbamol (ROBAXIN) 500 MG tablet Take 1 tablet (500 mg total) by mouth every 8 (eight) hours as needed for muscle spasms., Starting 10/07/2015, Until Discontinued, Print         I have personally and contemperaneously reviewed labs and imaging and used in my decision making as above.   A medical screening exam was performed and I feel the patient has had an appropriate workup for their chief complaint at this time and likelihood of emergent condition existing is low and thus workup can continue on an outpatient basis.. Their vital signs are stable. They have been counseled on decision, discharge, follow up and  which symptoms necessitate immediate return to the emergency department.  They verbally stated understanding and agreement with plan and discharged in stable condition.      Merrily Pew, MD 10/07/15 [phone]

## 2015-10-15 ENCOUNTER — Ambulatory Visit (INDEPENDENT_AMBULATORY_CARE_PROVIDER_SITE_OTHER): Payer: Medicare Other | Admitting: Internal Medicine

## 2015-10-15 ENCOUNTER — Encounter: Payer: Self-pay | Admitting: Internal Medicine

## 2015-10-15 VITALS — BP 136/55 | HR 61 | Temp 98.5°F | Wt 145.0 lb

## 2015-10-15 DIAGNOSIS — M25511 Pain in right shoulder: Secondary | ICD-10-CM | POA: Diagnosis not present

## 2015-10-15 MED ORDER — DICLOFENAC SODIUM 1 % TD GEL: 4.0000 g | Freq: Four times a day (QID) | TRANSDERMAL | Status: DC

## 2015-10-15 NOTE — Assessment & Plan Note (Signed)
Occurred after a fall when Pt landed on her right shoulder. X-ray and CT of shoulder and humerus were negative. 5/5 strength in the right upper extremity. I suspect this is just a muscle injury. Pt has pain with all movement, which limits exam, but no significant weakness. Full passive ROM.  - Advised Pt try PT, but she refused because she does not have transportation. - Will try conservative treatment with Aleve, heating pad, voltaren gel, and gentle movement and stretching - Can try an arm sling for a few days until pain improves - Follow-up in 2 weeks if not improved

## 2015-10-15 NOTE — Progress Notes (Signed)
[facility] Phone: [phone]  Subjective:  Right shoulder pain: She injured her shoulder 2 weeks ago. Her husband was about to fall and she tried to help him, but ended up falling herself. She landed on the outside of her right shoulder. She started having severe pain a week after that. The pain is located on the outer part of her shoulder. She was seen in the ED on 6/28. Right shoulder x-ray and CT were negative. Since being seen in the ED, her pain has gotten worse. She was given Ibuprofen and Robaxin, and neither is helping. The pain is worse with movement. The pain is better if she holds her shoulder in the flexed position up against her body. No swelling, no erythema, no ecchymosis. The pain does not radiate.   ROS: See HPI for pertinent positives and negatives Past Medical History- HTN, CAD, GERD, T2DM, HLD Reviewed problem list.  Medications- reviewed and updated Current Outpatient Prescriptions  Medication Sig Dispense Refill  . amLODipine (NORVASC) 2.5 MG tablet Take 2.5 mg by mouth daily. Reported on 07/20/2015  0  . aspirin 81 MG chewable tablet Chew 81 mg by mouth daily.      Marland Kitchen atorvastatin (LIPITOR) 40 MG tablet Take 1 tablet (40 mg total) by mouth daily. 90 tablet 3  . esomeprazole (NEXIUM) 40 MG packet Take 40 mg by mouth daily before breakfast. 30 each 12  . glipiZIDE (GLUCOTROL) 10 MG tablet Take 1 tablet (10 mg total) by mouth daily before breakfast. 32 tablet 1  . glucose blood test strip Check sugars three times daily 100 each 11  . ibuprofen (ADVIL,MOTRIN) 200 MG tablet Take 2 tablets (400 mg total) by mouth every 6 (six) hours as needed (pain). 30 tablet 0  . levocetirizine (XYZAL) 5 MG tablet Take 1 tablet (5 mg total) by mouth every evening. 30 tablet 2  . methocarbamol (ROBAXIN) 500 MG tablet Take 1 tablet (500 mg total) by mouth every 8 (eight) hours as needed for muscle spasms. 15 tablet 0  . metoCLOPramide (REGLAN) 5 MG tablet Take 1  tablet (5 mg total) by mouth 3 (three) times daily before meals. 90 tablet 1  . nitroGLYCERIN (NITROSTAT) 0.4 MG SL tablet Place 1 tablet (0.4 mg total) under the tongue every 5 (five) minutes as needed. If chest pain not relieved by third tab call 911. 5 tablet 0  . quinapril-hydrochlorothiazide (ACCURETIC) 20-12.5 MG tablet Take 2 tablets by mouth daily. 180 tablet 3  . Travoprost, BAK Free, (TRAVATAN) 0.004 % SOLN ophthalmic solution Place 1 drop into both eyes at bedtime.    . triamcinolone ointment (KENALOG) 0.1 % Apply 1 application topically 2 (two) times daily. 80 g 0  . Vitamin D, Ergocalciferol, (DRISDOL) 50000 units CAPS capsule Take 1 capsule (50,000 Units total) by mouth every 7 (seven) days. 30 capsule 0  . [DISCONTINUED] insulin glargine (LANTUS) 100 UNIT/ML injection Inject 0.16 mLs (16 Units total) into the skin daily. (Patient not taking: Reported on 10/07/2015) 10 mL 11   No current facility-administered medications for this visit.   Chief complaint-noted Family history reviewed for today's visit. No changes. Social history- patient is a never smoker  Objective: BP 136/55 mmHg  Pulse 61  Temp(Src) 98.5 F (36.9 C) (Oral)  Wt 145 lb (65.772 kg) Gen: NAD, alert, cooperative with exam HEENT: NCAT, EOMI, MMM Neck: FROM, supple Resp: Normal work of breathing Msk: Right shoulder without edema, erythema, ecchymoses, or deformity; active ROM limited by pain;  full passive ROM; 5/5 strength in upper extremities bilaterally; pain with all special testing- empty can test, Hawkin's, Neer's, and lift-off test, but no significant weakness Neuro: Alert and oriented, no gross deficits Skin: No rashes, no lesions Psych: Appropriate behavior  Assessment/Plan: Right shoulder pain: Occurred after a fall when Pt landed on her right shoulder. X-ray and CT of shoulder and humerus were negative. 5/5 strength in the right upper extremity. I suspect this is just a muscle injury. Pt has pain  with all movement, which limits exam, but no significant weakness. Full passive ROM.  - Advised Pt try PT, but she refused because she does not have transportation. - Will try conservative treatment with Aleve, heating pad, voltaren gel, and gentle movement and stretching - Can try an arm sling for a few days until pain improves - Follow-up in 2 weeks if not improved   Hyman Bible, MD PGY-2

## 2015-10-15 NOTE — Patient Instructions (Signed)
It was so nice to meet you!  I have prescribed some Voltaren gel for your shoulder pain. Please use this four times a day every day to help with the pain. You can also buy a shoulder sling at [facility] or a pharmacy to help hold your arm in a more comfortable position. You should take Aleve and use a heating pad as well.  Once your pain gets better, please start moving your shoulder around to help regain strength and mobility.  If your pain is not better in 2 weeks, please give [place] a call.  -Dr. Brett Albino

## 2015-10-19 ENCOUNTER — Telehealth: Payer: Self-pay | Admitting: *Deleted

## 2015-10-19 NOTE — Telephone Encounter (Signed)
Prior Authorization received from [facility] for Diclofenac Sodium 1% gel.  PA form placed in provider box for completion. Derl Barrow, RN

## 2015-10-20 NOTE — Telephone Encounter (Signed)
PA faxed to [employer] for review.  Review could take 24-72 hours to complete.  Derl Barrow, RN

## 2015-10-23 NOTE — Telephone Encounter (Signed)
PA approved via [employer] until 04/10/16.  Reference number: [id number]. Derl Barrow, RN

## 2015-11-18 ENCOUNTER — Ambulatory Visit (INDEPENDENT_AMBULATORY_CARE_PROVIDER_SITE_OTHER): Payer: Medicare Other | Admitting: Family Medicine

## 2015-11-18 ENCOUNTER — Encounter: Payer: Self-pay | Admitting: Family Medicine

## 2015-11-18 VITALS — BP 143/54 | HR 71 | Temp 98.2°F | Wt 143.0 lb

## 2015-11-18 DIAGNOSIS — R1310 Dysphagia, unspecified: Secondary | ICD-10-CM

## 2015-11-18 DIAGNOSIS — E781 Pure hyperglyceridemia: Secondary | ICD-10-CM

## 2015-11-18 DIAGNOSIS — E559 Vitamin D deficiency, unspecified: Secondary | ICD-10-CM | POA: Diagnosis not present

## 2015-11-18 DIAGNOSIS — IMO0001 Reserved for inherently not codable concepts without codable children: Secondary | ICD-10-CM

## 2015-11-18 DIAGNOSIS — I10 Essential (primary) hypertension: Secondary | ICD-10-CM | POA: Diagnosis not present

## 2015-11-18 DIAGNOSIS — E1165 Type 2 diabetes mellitus with hyperglycemia: Secondary | ICD-10-CM | POA: Diagnosis not present

## 2015-11-18 LAB — POCT GLYCOSYLATED HEMOGLOBIN (HGB A1C): Hemoglobin A1C: 8.3

## 2015-11-18 MED ORDER — GLIPIZIDE 10 MG PO TABS: 10.0000 mg | ORAL_TABLET | Freq: Two times a day (BID) | ORAL | 1 refills | Status: DC

## 2015-11-18 MED ORDER — AMLODIPINE BESYLATE 2.5 MG PO TABS: 2.5000 mg | ORAL_TABLET | Freq: Every day | ORAL | 1 refills | Status: DC

## 2015-11-18 MED ORDER — METOCLOPRAMIDE HCL 5 MG PO TABS: 5.0000 mg | ORAL_TABLET | Freq: Three times a day (TID) | ORAL | 0 refills | Status: DC

## 2015-11-18 MED ORDER — ATORVASTATIN CALCIUM 40 MG PO TABS: 40.0000 mg | ORAL_TABLET | Freq: Every day | ORAL | 3 refills | Status: DC

## 2015-11-18 MED ORDER — ESOMEPRAZOLE MAGNESIUM 40 MG PO PACK: 40.0000 mg | PACK | Freq: Every day | ORAL | 1 refills | Status: DC

## 2015-11-18 NOTE — Assessment & Plan Note (Signed)
Not at goal.  Asked patient to take her prescribed amlodipine and monitor

## 2015-11-18 NOTE — Progress Notes (Signed)
Subjective  Patient is presenting with the following illnesses.  Could not get in to see her new PCP  DIABETES Disease Monitoring: Blood Sugar ranges(Severity) -does not check  Associated Symptoms- Polyuria/phagia/dipsia- no      Visual problems- no Medications: Compliance(Modifying factor) - daily glipizice Hypoglycemic symptoms- has never had Timing - continuous  HYPERTENSION Disease Monitoring  Home BP Monitoring (Severity) does not check Symptoms - Chest pain- no    Dyspnea- no Medications (Modifying factors) Compliance-  Did not think she was taking amlodipine. Lightheadedness-  no  Edema- no Timing - continuous  Duration - years ROS - See HPI  PMH Lab Review   Potassium  Date Value Ref Range Status  07/20/2015 3.3 (L) 3.5 - 5.3 mmol/L Final  05/15/2014 3.6 3.5 - 5.1 mEq/L Final   Sodium  Date Value Ref Range Status  07/20/2015 139 135 - 146 mmol/L Final  05/15/2014 143 136 - 145 mEq/L Final   Creatinine  Date Value Ref Range Status  05/15/2014 1.0 0.6 - 1.1 mg/dL Final   Creat  Date Value Ref Range Status  07/20/2015 1.06 (H) 0.50 - 0.99 mg/dL Final      HYPERLIPIDEMIA Symptoms Chest pain on exertion:  No   Leg claudication:   no Medications (modifying factor): Compliance- daily lipitor Right upper quadrant pain- no  Muscle aches- no Duration - years   Timing - continuous    Component Value Date/Time   CHOL 164 07/20/2015 1641   TRIG 369 (H) 07/20/2015 1641   HDL 43 (L) 07/20/2015 1641   VLDL 74 (H) 07/20/2015 1641   CHOLHDL 3.8 07/20/2015 1641      Chief Complaint noted Review of Symptoms - see HPI PMH - Smoking status noted.     Objective Vital Signs reviewed Alert nad Heart - Regular rate and rhythm.  No murmurs, gallops or rubs.     Lungs are clear to auscultation, no crackles or wheezes. Abdomen: soft and non-tender without masses, organomegaly or hernias noted.  No guarding or rebound Extremities:  No cyanosis, edema, or deformity noted  with good range of motion of all major joints.   Diabetic Foot Check -  Appearance - no lesions, ulcers or calluses Skin - no unusual pallor or redness Monofilament testing -  Right - Great toe, medial, central, lateral ball and posterior foot intact Left - Great toe, medial, central, lateral ball and posterior foot decreased     Assessments/Plans  No problem-specific Assessment & Plan notes found for this encounter.   See Encounter view if individual problem A/Ps not visible See after visit summary for details of patient instuctions

## 2015-11-18 NOTE — Patient Instructions (Addendum)
Good to see you today!  Thanks for coming in.  Contact Dr Shearon Stalls to get an eye exam to check for diabetes changes  Your Diabetes is still a little high.  Take the glipizide twice a day   Make sure your are taking 2 blood pressure medications - Amlodipine and Accuretic   Keep a close eye on your feet your feeling in your left foot is not as good as the right   Come back in 3 months to see Dr Brett Albino  Bring all your medication bottles

## 2015-11-18 NOTE — Progress Notes (Signed)
poc a1c 

## 2015-11-18 NOTE — Assessment & Plan Note (Signed)
Not at goal.  Will increase glipizide and monitor for symptoms of hypoglycemia

## 2015-11-18 NOTE — Assessment & Plan Note (Signed)
Lab Results  Component Value Date   CHOL 164 07/20/2015   CHOL 152 07/16/2014   CHOL 144 05/27/2013   Lab Results  Component Value Date   HDL 43 (L) 07/20/2015   HDL 41 (L) 07/16/2014   HDL 36 (L) 05/27/2013   Lab Results  Component Value Date   LDLCALC 47 07/20/2015   LDLCALC 55 07/16/2014   LDLCALC 66 05/27/2013   Lab Results  Component Value Date   TRIG 369 (H) 07/20/2015   TRIG 278 (H) 07/16/2014   TRIG 212 (H) 05/27/2013   Lab Results  Component Value Date   CHOLHDL 3.8 07/20/2015   CHOLHDL 3.7 07/16/2014   CHOLHDL 4.0 05/27/2013   Stable on lipitor

## 2015-11-18 NOTE — Progress Notes (Signed)
Subjective  Patient is presenting with the following illnesses     Chief Complaint noted Review of Symptoms - see HPI PMH - Smoking status noted.     Objective Vital Signs reviewed     Assessments/Plans  No problem-specific Assessment & Plan notes found for this encounter.   See Encounter view if individual problem A/Ps not visible See after visit summary for details of patient instuctions 

## 2015-11-25 DIAGNOSIS — R81 Glycosuria: Secondary | ICD-10-CM | POA: Diagnosis not present

## 2015-11-25 DIAGNOSIS — R8271 Bacteriuria: Secondary | ICD-10-CM | POA: Diagnosis not present

## 2016-01-13 DIAGNOSIS — R350 Frequency of micturition: Secondary | ICD-10-CM | POA: Diagnosis not present

## 2016-01-24 ENCOUNTER — Other Ambulatory Visit: Payer: Self-pay | Admitting: Family Medicine

## 2016-01-24 DIAGNOSIS — K219 Gastro-esophageal reflux disease without esophagitis: Secondary | ICD-10-CM

## 2016-01-24 DIAGNOSIS — R1310 Dysphagia, unspecified: Secondary | ICD-10-CM

## 2016-02-05 DIAGNOSIS — B9620 Unspecified Escherichia coli [E. coli] as the cause of diseases classified elsewhere: Secondary | ICD-10-CM | POA: Diagnosis not present

## 2016-02-05 DIAGNOSIS — N390 Urinary tract infection, site not specified: Secondary | ICD-10-CM | POA: Diagnosis not present

## 2016-03-14 ENCOUNTER — Encounter: Payer: Self-pay | Admitting: Internal Medicine

## 2016-03-14 ENCOUNTER — Ambulatory Visit (INDEPENDENT_AMBULATORY_CARE_PROVIDER_SITE_OTHER): Payer: Medicare Other | Admitting: Internal Medicine

## 2016-03-14 VITALS — BP 154/62 | HR 76 | Temp 98.1°F | Wt 144.0 lb

## 2016-03-14 DIAGNOSIS — IMO0001 Reserved for inherently not codable concepts without codable children: Secondary | ICD-10-CM

## 2016-03-14 DIAGNOSIS — E1165 Type 2 diabetes mellitus with hyperglycemia: Secondary | ICD-10-CM | POA: Diagnosis not present

## 2016-03-14 DIAGNOSIS — K5900 Constipation, unspecified: Secondary | ICD-10-CM

## 2016-03-14 DIAGNOSIS — Z23 Encounter for immunization: Secondary | ICD-10-CM

## 2016-03-14 DIAGNOSIS — R21 Rash and other nonspecific skin eruption: Secondary | ICD-10-CM | POA: Diagnosis not present

## 2016-03-14 DIAGNOSIS — Z0000 Encounter for general adult medical examination without abnormal findings: Secondary | ICD-10-CM

## 2016-03-14 LAB — POCT GLYCOSYLATED HEMOGLOBIN (HGB A1C): Hemoglobin A1C: 8.4

## 2016-03-14 MED ORDER — POLYETHYLENE GLYCOL 3350 17 GM/SCOOP PO POWD: 17.0000 g | Freq: Two times a day (BID) | ORAL | 1 refills | Status: DC

## 2016-03-14 MED ORDER — TRIAMCINOLONE ACETONIDE 0.05 % EX OINT: TOPICAL_OINTMENT | CUTANEOUS | 0 refills | Status: DC

## 2016-03-14 MED ORDER — SENNOSIDES-DOCUSATE SODIUM 8.6-50 MG PO TABS: 1.0000 | ORAL_TABLET | Freq: Two times a day (BID) | ORAL | 1 refills | Status: DC

## 2016-03-14 MED ORDER — DULAGLUTIDE 0.75 MG/0.5ML ~~LOC~~ SOAJ: 0.7500 mg | SUBCUTANEOUS | 0 refills | Status: DC

## 2016-03-14 NOTE — Assessment & Plan Note (Signed)
-   Pt due for colonoscopy. Pt will call GI doctor to schedule this. - Pt received flu shot today

## 2016-03-14 NOTE — Assessment & Plan Note (Signed)
Unsure of cause. No new medications. Re-assured that Pt likely does not have SBO because she denies vomiting, she is passing a lot of gas, and she states that she can feel stool in her bottom, but can't get it out. Not taking any medications for constipation. We discussed the importance of a rectal exam today, but Pt declined. - Will add Miralax bid and Senokot-S bid - Pt will call me in the next 1-2 days if she still has not had a bowel movement. - Return precautions discussed, including vomiting, no BM in the next 1-2 days, severe abdominal pain, not passing gas.

## 2016-03-14 NOTE — Assessment & Plan Note (Addendum)
Uncontrolled. Last A1c was 8.3%. CBGs have been running in the 140s-160s at home. Takes glipizide 10 mg twice a day. - Repeat A1c 8.4% today. Not at goal. - Continue Glipizide 10mg  bid - Will add Trulicity 0.75mg  weekly. Unsure if insurance will cover this. Pt will let me know if it is too expensive. - Continue checking CBGs 1-2 times per day - Will need to monitor closely for symptoms of hypoglycemia. - Diabetic foot exam performed in clinic today - Pt will call her ophthalmologist to schedule her annual diabetic eye exam - Follow-up in 3 months - Precepted with Dr. Wendy Poet

## 2016-03-14 NOTE — Progress Notes (Signed)
[facility] Phone: [phone]  Subjective:  [name] is a 70 year old female presenting for follow-up of diabetes. She also wants to discuss rash and constipation.  T2DM: Takes Glipizide 10mg  bid. Has had GI side effects on Metformin. Checks her blood sugars 1-2 times per day. Her CBGs have been 140-160s. She is interested in starting a weekly injectable because she heard about it on TV.  Constipation: Pt states she has been constipated for the last week. Her last BM was somewhere around 5-7 days ago. She states that she can feel the stool in her bottom but she strains and nothing comes out. She states that she has been passing a lot of gas. She endorses very mild nausea and denies vomiting. She states she has mild abdominal pain "all over". She states her stools have been dark brown. She denies any blood. She tried drinking coffee to help herself have a bowel movement, but this didn't help. She also tried eating a lot of green vegetables. Up until this week, she was having bowel movements every day. She has a history of abdominal hysterectomy and a history of colon cancer s/p partial colectomy in 2006. She has never had a bowel obstruction. No new medications.  Rash: Pt states she has had a rash on her back and on her upper arms since the summer. She feels like the rash is spreading on her back. The rash is very itchy. She has been trying to put vaseline and hydrocortisone cream on the rash, but it hasn't helped. She denies any fevers, spreading redness, or drainage.   ROS: See HPI for pertinent positives and negatives  Past Medical History- HTN, CAD, T2DM, hx colon cancer s/p partial colectomy in 2006, HLD, depression, anxiety  Family history reviewed for today's visit. No changes.  Social history- patient is a never smoker  Objective: BP (!) 154/62   Pulse 76   Temp 98.1 F (36.7 C) (Oral)   Wt 144 lb (65.3 kg)   SpO2 100%   BMI 24.72 kg/m  Gen: NAD, alert,  cooperative with exam HEENT: NCAT, EOMI, MMM Neck: FROM, supple CV: RRR, no murmur Resp: CTABL, no wheezes, normal work of breathing GI: +BS, soft, very mild diffuse "discomfort" to palpation, mild distention present, no rebound, no guarding GU: Deferred Diabetic Foot Exam: No deformities, no ulcerations, no other skin breakdown bilaterally; decreased sensation to monofilament testing in the great toe and third toe of the feet bilaterally, sensation otherwise normal; posterior tibialis and dorsalis pulses intact bilaterally Skin: 4cm x 4cm hyperpigmented/erythematous patch present over the left upper back, patch is dry in appearance. Skin on upper arms is dry in appearance.  Assessment/Plan: T2DM: Uncontrolled. Last A1c was 8.3%. CBGs have been running in the 140s-160s at home. Takes glipizide 10 mg twice a day. - Repeat A1c 8.4% today. Not at goal. - Continue Glipizide 10mg  bid - Will add Trulicity 0.75mg  weekly. Unsure if insurance will cover this. Pt will let me know if it is too expensive. - Continue checking CBGs 1-2 times per day - Will need to monitor closely for symptoms of hypoglycemia. - Diabetic foot exam performed in clinic today - Pt will call her ophthalmologist to schedule her annual diabetic eye exam - Follow-up in 3 months - Precepted with Dr. McDiarmid  Constipation: Unsure of cause. No new medications. Re-assured that Pt likely does not have SBO because she denies vomiting, she is passing a lot of gas, and she states that she  can feel stool in her bottom, but can't get it out. Not taking any medications for constipation. We discussed the importance of a rectal exam today, but Pt declined. - Will add Miralax bid and Senokot-S bid - Pt will call me in the next 1-2 days if she still has not had a bowel movement. - Return precautions discussed, including vomiting, no BM in the next 1-2 days, severe abdominal pain, not passing gas.  Rash: Pt with indistinct  hyperpigmented/erythematous dry patch on her left upper back. Unclear etiology. Almost looks like a patch of eczema, although it is in an unusual location for eczema. Doesn't look like a fungal infection. - Will try some Triamcinolone cream to see if it resolves on its own. - Advised Pt to use Vaseline or Aquaphor to her skin twice a day - Follow-up if rash on back is not improving. - Can consider trying an antifungal cream or getting a biopsy if not resolving.  Health Care Maintenance: - Pt due for colonoscopy. Pt will call GI doctor to schedule this. - Pt received flu shot today   Hyman Bible, MD PGY-2

## 2016-03-14 NOTE — Assessment & Plan Note (Signed)
Pt with indistinct hyperpigmented/erythematous dry patch on her left upper back. Unclear etiology. Almost looks like a patch of eczema, although it is in an unusual location for eczema. Doesn't look like a fungal infection. - Will try some Triamcinolone cream to see if it resolves on its own. - Advised Pt to use Vaseline or Aquaphor to her skin twice a day - Follow-up if rash on back is not improving. - Can consider trying an antifungal cream or getting a biopsy if not resolving.

## 2016-03-14 NOTE — Patient Instructions (Signed)
It was so nice to see you!  For your diabetes, I have started a new medication called Trulicity. Please inject this once a week, on the same day each week. You should talk to the pharmacist about any questions. Please continue to check your blood sugars at home.  For your rash, I have prescribed a steroid ointment. You should put this on your back twice a day until the rash gets better.  For your constipation, I have prescribed Miralax and Senokot-S. Please use Miralax twice a day and Senokot twice a day. Give me a call if you still have not had a bowel movement in the next few days.  We will see you back in 3 months.  -Dr. Brett Albino

## 2016-03-16 DIAGNOSIS — R350 Frequency of micturition: Secondary | ICD-10-CM | POA: Diagnosis not present

## 2016-03-18 ENCOUNTER — Other Ambulatory Visit: Payer: Self-pay | Admitting: Family Medicine

## 2016-03-18 ENCOUNTER — Telehealth: Payer: Self-pay | Admitting: *Deleted

## 2016-03-18 ENCOUNTER — Other Ambulatory Visit: Payer: Self-pay | Admitting: Internal Medicine

## 2016-03-18 DIAGNOSIS — IMO0001 Reserved for inherently not codable concepts without codable children: Secondary | ICD-10-CM

## 2016-03-18 DIAGNOSIS — E1165 Type 2 diabetes mellitus with hyperglycemia: Principal | ICD-10-CM

## 2016-03-18 DIAGNOSIS — E559 Vitamin D deficiency, unspecified: Secondary | ICD-10-CM

## 2016-03-18 MED ORDER — TRIAMCINOLONE ACETONIDE 0.05 % EX OINT: TOPICAL_OINTMENT | CUTANEOUS | 0 refills | Status: DC

## 2016-03-18 NOTE — Telephone Encounter (Signed)
Prior Authorization received from [employer] for Trianex 0.05% oint. PA form placed in provider box for completion. Derl Barrow, RN

## 2016-03-21 ENCOUNTER — Other Ambulatory Visit: Payer: Self-pay | Admitting: Internal Medicine

## 2016-03-21 MED ORDER — TRIAMCINOLONE ACETONIDE 0.5 % EX OINT: 1.0000 "application " | TOPICAL_OINTMENT | Freq: Two times a day (BID) | CUTANEOUS | 0 refills | Status: DC

## 2016-03-21 NOTE — Telephone Encounter (Signed)
Changed prescription to Kenalog ointment. Hopefully this will not require a prior auth.

## 2016-04-29 DIAGNOSIS — R350 Frequency of micturition: Secondary | ICD-10-CM | POA: Diagnosis not present

## 2016-05-04 ENCOUNTER — Encounter (HOSPITAL_COMMUNITY): Payer: Self-pay | Admitting: Emergency Medicine

## 2016-05-04 ENCOUNTER — Ambulatory Visit (HOSPITAL_COMMUNITY)
Admission: EM | Admit: 2016-05-04 | Discharge: 2016-05-04 | Disposition: A | Discharge status: Home / Self Care | Payer: Medicare Other | Attending: Emergency Medicine | Admitting: Emergency Medicine

## 2016-05-04 DIAGNOSIS — B029 Zoster without complications: Secondary | ICD-10-CM | POA: Diagnosis not present

## 2016-05-04 MED ORDER — VALACYCLOVIR HCL 1 G PO TABS: 1000.0000 mg | ORAL_TABLET | Freq: Three times a day (TID) | ORAL | 0 refills | Status: AC

## 2016-05-04 MED ORDER — CAPSAICIN-MENTHOL-METHYL SAL 0.025-1-12 % EX CREA
1.0000 | TOPICAL_CREAM | Freq: Four times a day (QID) | CUTANEOUS | 1 refills | Status: DC | PRN
Start: 2016-05-04 — End: 2016-05-12

## 2016-05-04 NOTE — ED Triage Notes (Signed)
The patient presented to the [facility] with a complaint of a rash on her back isolated to the left side of her spine and has become painful. The patient did report receiving the shingles vaccination.

## 2016-05-04 NOTE — ED Provider Notes (Signed)
CSN: [id number]     Arrival date & time 05/04/16  1140 History   First MD Initiated Contact with Patient 05/04/16 1219     Chief Complaint  Patient presents with  . Rash   (Consider location/radiation/quality/duration/timing/severity/associated sxs/prior Treatment) Patient c/o rash on her back that is itching and is uncomfortable. She did get a shingles vaccination.   The history is provided by the patient.  Rash  Location:  Head/neck Quality: blistering and itchiness   Severity:  Mild Onset quality:  Sudden Duration:  2 days Timing:  Constant Progression:  Worsening Chronicity:  New Relieved by:  None tried Worsened by:  Nothing Ineffective treatments:  None tried   Past Medical History:  Diagnosis Date  . Allergy   . Asthma   . Brain tumor (benign) ([place])   . Colon cancer [facility]) Nov. 27,  2006  . Diabetes mellitus    Type 2  . Female bladder prolapse   . Gastritis   . GERD (gastroesophageal reflux disease)   . Glaucoma   . Hypercholesterolemia   . Hypertension    Past Surgical History:  Procedure Laterality Date  . ABDOMINAL HYSTERECTOMY  1976  . bladder tac    . CARDIAC CATHETERIZATION  2005  . COLONOSCOPY W/ BIOPSIES    . ESOPHAGEAL MANOMETRY N/A 03/20/2015   Procedure: ESOPHAGEAL MANOMETRY (EM);  Surgeon: Gatha Mayer, MD;  Location: [facility];  Service: Endoscopy;  Laterality: N/A;  . LAPAROSCOPIC NISSEN FUNDOPLICATION  6063  . repair prolapsedbladder    . RIGHT COLECTOMY  Nov. 27, 2006  . TMJ ARTHROPLASTY    . UPPER GASTROINTESTINAL ENDOSCOPY     Family History  Problem Relation Age of Onset  . Lymphoma Other   . Colon cancer Neg Hx   . Stomach cancer Neg Hx   . Bone cancer Father   . Diabetes Daughter   . Hypertension Daughter   . Heart attack Daughter   . Suicidality Son     deceased   Social History  Substance Use Topics  . Smoking status: Never Smoker  . Smokeless tobacco: Never Used  . Alcohol use No   OB History    No data  available     Review of Systems  Constitutional: Negative.   HENT: Negative.   Eyes: Negative.   Respiratory: Negative.   Cardiovascular: Negative.   Gastrointestinal: Negative.   Endocrine: Negative.   Genitourinary: Negative.   Musculoskeletal: Negative.   Skin: Positive for rash.  Allergic/Immunologic: Negative.   Neurological: Negative.   Hematological: Negative.   Psychiatric/Behavioral: Negative.     Allergies  Metformin and related  Home Medications   Prior to Admission medications   Medication Sig Start Date End Date Taking? Authorizing Provider  amLODipine (NORVASC) 2.5 MG tablet Take 1 tablet (2.5 mg total) by mouth daily. Reported on 07/20/2015 11/18/15   Lind Covert, MD  aspirin 81 MG chewable tablet Chew 81 mg by mouth daily.      Historical Provider, MD  atorvastatin (LIPITOR) 40 MG tablet Take 1 tablet (40 mg total) by mouth daily. 11/18/15   Lind Covert, MD  Capsaicin-Menthol-Methyl Sal (CAPSAICIN-METHYL SAL-MENTHOL) 0.025-1-12 % CREA Apply 1 application topically 4 (four) times daily as needed. 05/04/16   Lysbeth Penner, FNP  diclofenac sodium (VOLTAREN) 1 % GEL Apply 4 g topically 4 (four) times daily. 10/15/15   Sela Hua, MD  Dulaglutide (TRULICITY) 0.16 WF/0.9NA SOPN Inject 0.75 mg into the skin once a week. 03/14/16  Sela Hua, MD  esomeprazole (NEXIUM) 40 MG capsule take 1 capsule by mouth once daily BEFORE BREAKFAST 01/25/16   Sela Hua, MD  esomeprazole (NEXIUM) 40 MG packet Take 40 mg by mouth daily before breakfast. 11/18/15   Lind Covert, MD  glipiZIDE (GLUCOTROL) 10 MG tablet Take 1 tablet (10 mg total) by mouth 2 (two) times daily before a meal. 11/18/15   Lind Covert, MD  glucose blood test strip Check sugars three times daily 07/16/14   Sharon [address], MD  ibuprofen (ADVIL,MOTRIN) 200 MG tablet Take 2 tablets (400 mg total) by mouth every 6 (six) hours as needed (pain). 10/07/15   Merrily Pew, MD   levocetirizine (XYZAL) 5 MG tablet Take 1 tablet (5 mg total) by mouth every evening. 07/20/15   Olam Idler, MD  methocarbamol (ROBAXIN) 500 MG tablet Take 1 tablet (500 mg total) by mouth every 8 (eight) hours as needed for muscle spasms. 10/07/15   Merrily Pew, MD  metoCLOPramide (REGLAN) 5 MG tablet Take 1 tablet (5 mg total) by mouth 3 (three) times daily before meals. 11/18/15   Lind Covert, MD  nitroGLYCERIN (NITROSTAT) 0.4 MG SL tablet Place 1 tablet (0.4 mg total) under the tongue every 5 (five) minutes as needed. If chest pain not relieved by third tab call 911. 11/24/14   Olam Idler, MD  polyethylene glycol powder (GLYCOLAX/MIRALAX) powder Take 17 g by mouth 2 (two) times daily. 03/14/16   Sela Hua, MD  quinapril-hydrochlorothiazide (ACCURETIC) 20-12.5 MG tablet Take 2 tablets by mouth daily. 07/20/15   Olam Idler, MD  senna-docusate (SENOKOT-S) 8.6-50 MG tablet Take 1 tablet by mouth 2 (two) times daily. 03/14/16   Sela Hua, MD  Travoprost, BAK Free, (TRAVATAN) 0.004 % SOLN ophthalmic solution Place 1 drop into both eyes at bedtime.    Historical Provider, MD  triamcinolone ointment (KENALOG) 0.5 % Apply 1 application topically 2 (two) times daily. 03/21/16   Sela Hua, MD  valACYclovir (VALTREX) 1000 MG tablet Take 1 tablet (1,000 mg total) by mouth 3 (three) times daily. 05/04/16 05/18/16  Lysbeth Penner, FNP  Vitamin D, Ergocalciferol, (DRISDOL) 50000 units CAPS capsule Take 1 capsule (50,000 Units total) by mouth every 7 (seven) days. 08/03/15   Olam Idler, MD   Meds Ordered and Administered this Visit  Medications - No data to display  BP 157/60 (BP Location: Right Arm)   Pulse 79   Temp 98.4 F (36.9 C) (Oral)   Resp 16   SpO2 100%  No data found.   Physical Exam  Constitutional: She appears well-developed and well-nourished.  HENT:  Head: Normocephalic.  Eyes: Conjunctivae and EOM are normal. Pupils are equal, round, and reactive to  light.  Neck: Normal range of motion. Neck supple.  Cardiovascular: Normal rate, regular rhythm and normal heart sounds.   Pulmonary/Chest: Effort normal and breath sounds normal.  Skin: Rash noted.  Rash with vesicles left upper back.  Nursing note and vitals reviewed.   Urgent Care Course     Procedures (including critical care time)  Labs Review Labs Reviewed - No data to display  Imaging Review No results found.   Visual Acuity Review  Right Eye Distance:   Left Eye Distance:   Bilateral Distance:    Right Eye Near:   Left Eye Near:    Bilateral Near:         MDM   1. Herpes zoster without  complication    Valtrex 1gram one po tid x 7 days Capsaicin cream apply qid prn #60gm    Lysbeth Penner, FNP 05/04/16 1243

## 2016-05-10 ENCOUNTER — Other Ambulatory Visit: Payer: Self-pay | Admitting: Internal Medicine

## 2016-05-12 ENCOUNTER — Ambulatory Visit (INDEPENDENT_AMBULATORY_CARE_PROVIDER_SITE_OTHER): Payer: Medicare Other | Admitting: Family Medicine

## 2016-05-12 ENCOUNTER — Encounter: Payer: Self-pay | Admitting: Family Medicine

## 2016-05-12 VITALS — BP 120/68 | HR 65 | Temp 98.2°F | Ht 64.0 in | Wt 144.2 lb

## 2016-05-12 DIAGNOSIS — B029 Zoster without complications: Secondary | ICD-10-CM | POA: Diagnosis not present

## 2016-05-12 MED ORDER — CAPSAICIN-MENTHOL-METHYL SAL 0.025-1-12 % EX CREA: 1.0000 "application " | TOPICAL_CREAM | Freq: Four times a day (QID) | CUTANEOUS | 1 refills | Status: DC | PRN

## 2016-05-12 NOTE — Patient Instructions (Signed)
Thank you so much for coming to visit today! The shingles have improved, however the pain can often linger for many weeks. I have resent the cream to be used for pain to the pharmacy. Please let me know if you have a difficult time picking it up. You may also use over the counter pain medication. Please return if no improvement over the next 2-3 weeks.  Dr. Gerlean Ren

## 2016-05-12 NOTE — Progress Notes (Signed)
Subjective:     Patient ID: [name], female   DOB: 27-Jul-1945, 71 y.o.   MRN: [id number]  HPI Mrs. Omara is a 71yo female presenting for Urgent Care Follow Up. Urgent Care visit on 05/04/16 with diagnosis of Shingles along left upper back; prescription for Valtrex and Capsaicin Cream given. Notes improvement of rash since visit to Urgent Care. Completed course of Valtrex. First noted improvement of pain with Valtrex, but after completion of course pain has been worsening again. Pain is located over site of rash and is described as burning and itching. States she never picked up her prescription for Capsaicin cream, so she has not been using this. Initially had fevers and chills, but she did not measure a temperature. Nonsmoker.  Review of Systems Per HPI    Objective:   Physical Exam  Constitutional: She appears well-developed and well-nourished.  Cardiovascular: Normal rate and regular rhythm.   No murmur heard. Pulmonary/Chest: Effort normal. No respiratory distress. She has no wheezes.  Lymphadenopathy:    She has no cervical adenopathy.  Skin:  Area of hyperpigmentation and dryness located over left shoulder to midline, no active vesicles.  Psychiatric: She has a normal mood and affect. Her behavior is normal.      Assessment and Plan:     1 Herpes zoster without complication Improving. Completed course of Valtrex. Capsaicin cream resent to pharmacy and recommend she pick this up and use OTC pain medication. Discussed that pain may linger for several weeks. If no improvement over the next several weeks, to return. Consider Gabapentin if no improvement.

## 2016-05-22 ENCOUNTER — Other Ambulatory Visit: Payer: Self-pay | Admitting: Family Medicine

## 2016-05-22 DIAGNOSIS — E1165 Type 2 diabetes mellitus with hyperglycemia: Principal | ICD-10-CM

## 2016-05-22 DIAGNOSIS — E559 Vitamin D deficiency, unspecified: Secondary | ICD-10-CM

## 2016-05-22 DIAGNOSIS — IMO0001 Reserved for inherently not codable concepts without codable children: Secondary | ICD-10-CM

## 2016-05-25 ENCOUNTER — Encounter: Payer: Self-pay | Admitting: Internal Medicine

## 2016-05-26 ENCOUNTER — Encounter: Payer: Self-pay | Admitting: Internal Medicine

## 2016-05-26 ENCOUNTER — Telehealth: Payer: Self-pay | Admitting: Internal Medicine

## 2016-05-26 NOTE — Telephone Encounter (Signed)
Pt was seen by Dr. Gerlean Ren and is not happy with how the visit went. Pt states she had shingles and was not given any medication. Pt did not want to schedule another appointment with someone else. Doesn't trust them. Pt wants Dr. McDiarmid to call her. Please advise. ep

## 2016-05-26 NOTE — Telephone Encounter (Signed)
Will forward to MD to advise. Clela Hagadorn,CMA  

## 2016-05-31 ENCOUNTER — Other Ambulatory Visit: Payer: Self-pay

## 2016-05-31 ENCOUNTER — Encounter: Payer: Self-pay | Admitting: Internal Medicine

## 2016-05-31 ENCOUNTER — Other Ambulatory Visit: Payer: Self-pay | Admitting: Internal Medicine

## 2016-05-31 DIAGNOSIS — Z1231 Encounter for screening mammogram for malignant neoplasm of breast: Secondary | ICD-10-CM

## 2016-06-14 ENCOUNTER — Ambulatory Visit
Admission: RE | Admit: 2016-06-14 | Discharge: 2016-06-14 | Disposition: A | Discharge status: Home / Self Care | Payer: Medicare Other | Source: Ambulatory Visit

## 2016-06-14 DIAGNOSIS — Z1231 Encounter for screening mammogram for malignant neoplasm of breast: Secondary | ICD-10-CM | POA: Diagnosis not present

## 2016-06-15 ENCOUNTER — Other Ambulatory Visit: Payer: Self-pay | Admitting: Family Medicine

## 2016-06-15 DIAGNOSIS — R928 Other abnormal and inconclusive findings on diagnostic imaging of breast: Secondary | ICD-10-CM

## 2016-06-22 ENCOUNTER — Ambulatory Visit
Admission: RE | Admit: 2016-06-22 | Discharge: 2016-06-22 | Disposition: A | Discharge status: Home / Self Care | Payer: Medicare Other | Source: Ambulatory Visit | Attending: Family Medicine | Admitting: Family Medicine

## 2016-06-22 DIAGNOSIS — R928 Other abnormal and inconclusive findings on diagnostic imaging of breast: Secondary | ICD-10-CM

## 2016-06-22 DIAGNOSIS — R590 Localized enlarged lymph nodes: Secondary | ICD-10-CM | POA: Diagnosis not present

## 2016-06-30 ENCOUNTER — Ambulatory Visit (AMBULATORY_SURGERY_CENTER): Payer: Self-pay

## 2016-06-30 VITALS — Ht 64.0 in | Wt 143.0 lb

## 2016-06-30 DIAGNOSIS — C189 Malignant neoplasm of colon, unspecified: Secondary | ICD-10-CM

## 2016-06-30 NOTE — Progress Notes (Signed)
No allergies to eggs or soy No past problems with anesthesia No diet meds No home oxygen  Declined emmi 

## 2016-07-06 ENCOUNTER — Other Ambulatory Visit: Payer: Self-pay | Admitting: Internal Medicine

## 2016-07-14 ENCOUNTER — Encounter: Payer: Self-pay | Admitting: Internal Medicine

## 2016-07-14 ENCOUNTER — Ambulatory Visit (AMBULATORY_SURGERY_CENTER): Payer: Medicare Other | Admitting: Internal Medicine

## 2016-07-14 VITALS — BP 135/77 | HR 54 | Temp 95.3°F | Resp 19 | Ht 64.0 in | Wt 143.0 lb

## 2016-07-14 DIAGNOSIS — I272 Pulmonary hypertension, unspecified: Secondary | ICD-10-CM | POA: Diagnosis not present

## 2016-07-14 DIAGNOSIS — Z85038 Personal history of other malignant neoplasm of large intestine: Secondary | ICD-10-CM

## 2016-07-14 DIAGNOSIS — C189 Malignant neoplasm of colon, unspecified: Secondary | ICD-10-CM

## 2016-07-14 MED ORDER — SODIUM CHLORIDE 0.9 % IV SOLN: 500.0000 mL | INTRAVENOUS | Status: DC

## 2016-07-14 NOTE — Progress Notes (Signed)
Procedure report mailed to patient.

## 2016-07-14 NOTE — Patient Instructions (Addendum)
No polyps or cancer seen. All Looks good.  Next colonoscopy will be in 5 years.  I appreciate the opportunity to care for you.  Gatha Mayer, MD, FACG  YOU HAD AN ENDOSCOPIC PROCEDURE TODAY AT [facility]:   Refer to the procedure report that was given to you for any specific questions about what was found during the examination.  If the procedure report does not answer your questions, please call your gastroenterologist to clarify.  If you requested that your care partner not be given the details of your procedure findings, then the procedure report has been included in a sealed envelope for you to review at your convenience later.  YOU SHOULD EXPECT: Some feelings of bloating in the abdomen. Passage of more gas than usual.  Walking can help get rid of the air that was put into your GI tract during the procedure and reduce the bloating. If you had a lower endoscopy (such as a colonoscopy or flexible sigmoidoscopy) you may notice spotting of blood in your stool or on the toilet paper. If you underwent a bowel prep for your procedure, you may not have a normal bowel movement for a few days.  Please Note:  You might notice some irritation and congestion in your nose or some drainage.  This is from the oxygen used during your procedure.  There is no need for concern and it should clear up in a day or so.  SYMPTOMS TO REPORT IMMEDIATELY:   Following lower endoscopy (colonoscopy or flexible sigmoidoscopy):  Excessive amounts of blood in the stool  Significant tenderness or worsening of abdominal pains  Swelling of the abdomen that is new, acute  Fever of 100F or higher   For urgent or emergent issues, a gastroenterologist can be reached at any hour by calling [phone].   DIET:  We do recommend a small meal at first, but then you may proceed to your regular diet.  Drink plenty of fluids but you should avoid alcoholic beverages for 24 hours.  ACTIVITY:  You should  plan to take it easy for the rest of today and you should NOT DRIVE or use heavy machinery until tomorrow (because of the sedation medicines used during the test).    FOLLOW UP: Our staff will call the number listed on your records the next business day following your procedure to check on you and address any questions or concerns that you may have regarding the information given to you following your procedure. If we do not reach you, we will leave a message.  However, if you are feeling well and you are not experiencing any problems, there is no need to return our call.  We will assume that you have returned to your regular daily activities without incident.  If any biopsies were taken you will be contacted by phone or by letter within the next 1-3 weeks.  Please call us at [phone] if you have not heard about the biopsies in 3 weeks.    SIGNATURES/CONFIDENTIALITY: You and/or your care partner have signed paperwork which will be entered into your electronic medical record.  These signatures attest to the fact that that the information above on your After Visit Summary has been reviewed and is understood.  Full responsibility of the confidentiality of this discharge information lies with you and/or your care-partner.

## 2016-07-14 NOTE — Progress Notes (Signed)
Report to PACU, RN, vss, BBS= Clear.  

## 2016-07-14 NOTE — Progress Notes (Signed)
Pt's states no medical or surgical changes since previsit or office visit. 

## 2016-07-14 NOTE — Op Note (Signed)
[facility] Patient Name: [name] Procedure Date: 07/14/2016 8:04 AM MRN: [id number] Endoscopist: Gatha Mayer , MD Age: 71 Referring MD:  Date of Birth: Jan 17, 1946 Gender: Female Account #: [id number] Procedure:                Colonoscopy Indications:              High risk colon cancer surveillance: Personal                            history of colon cancer, Last colonoscopy: 2013 Medicines:                Propofol per Anesthesia, Monitored Anesthesia Care Procedure:                Pre-Anesthesia Assessment:                           - Prior to the procedure, a History and Physical                            was performed, and patient medications and                            allergies were reviewed. The patient's tolerance of                            previous anesthesia was also reviewed. The risks                            and benefits of the procedure and the sedation                            options and risks were discussed with the patient.                            All questions were answered, and informed consent                            was obtained. Prior Anticoagulants: The patient has                            taken no previous anticoagulant or antiplatelet                            agents. ASA Grade Assessment: II - A patient with                            mild systemic disease. After reviewing the risks                            and benefits, the patient was deemed in                            satisfactory condition to undergo the procedure.  After obtaining informed consent, the colonoscope                            was passed under direct vision. Throughout the                            procedure, the patient's blood pressure, pulse, and                            oxygen saturations were monitored continuously. The                            Model CF-HQ190L [id number]) scope was introduced         through the anus and advanced to the the                            ileocolonic anastomosis. The colonoscopy was                            performed without difficulty. The patient tolerated                            the procedure well. The quality of the bowel                            preparation was good. The bowel preparation used                            was Miralax. The rectum and Ileocolonic anastomsis                            areas were photographed. Scope In: 8:08:11 AM Scope Out: 8:16:16 AM Scope Withdrawal Time: 0 hours 6 minutes 30 seconds  Total Procedure Duration: 0 hours 8 minutes 5 seconds  Findings:                 The perianal and digital rectal examinations were                            normal.                           There was evidence of a prior end-to-side                            ileo-colonic anastomosis in the transverse colon.                            This was patent and was characterized by healthy                            appearing mucosa.                           The exam was otherwise without abnormality on  direct and retroflexion views. Complications:            No immediate complications. Estimated Blood Loss:     Estimated blood loss: none. Impression:               - Patent end-to-side ileo-colonic anastomosis,                            characterized by healthy appearing mucosa.                           - The examination was otherwise normal on direct                            and retroflexion views.                           - No specimens collected. Recommendation:           - Patient has a contact number available for                            emergencies. The signs and symptoms of potential                            delayed complications were discussed with the                            patient. Return to normal activities tomorrow.                            Written discharge instructions were  provided to the                            patient.                           - Resume previous diet.                           - Continue present medications.                           - Repeat colonoscopy in 5 years for surveillance. Gatha Mayer, MD 07/14/2016 4:24:21 PM This report has been signed electronically.

## 2016-07-15 ENCOUNTER — Telehealth: Payer: Self-pay | Admitting: *Deleted

## 2016-07-15 NOTE — Telephone Encounter (Signed)
  Follow up Call-  Call back number 07/14/2016 09/10/2015  Post procedure Call Back phone  # [phone]  Permission to leave phone message Yes Yes  Some recent data might be hidden     Patient questions:  Voice mail has not been set up yet.

## 2016-07-17 ENCOUNTER — Other Ambulatory Visit: Payer: Self-pay | Admitting: Internal Medicine

## 2016-07-17 DIAGNOSIS — K219 Gastro-esophageal reflux disease without esophagitis: Secondary | ICD-10-CM

## 2016-07-17 DIAGNOSIS — R1310 Dysphagia, unspecified: Secondary | ICD-10-CM

## 2016-07-18 ENCOUNTER — Telehealth: Payer: Self-pay | Admitting: *Deleted

## 2016-07-18 NOTE — Telephone Encounter (Signed)
  Follow up Call-  Call back number 07/14/2016 09/10/2015  Post procedure Call Back phone  # [phone]  Permission to leave phone message Yes Yes  Some recent data might be hidden     Patient questions:  Do you have a fever, pain , or abdominal swelling? No. Pain Score  0 *  Have you tolerated food without any problems? Yes.    Have you been able to return to your normal activities? Yes.    Do you have any questions about your discharge instructions: Diet   No. Medications  No. Follow up visit  No.  Do you have questions or concerns about your Care? No.  Actions: * If pain score is 4 or above: No action needed, pain <4.

## 2016-07-27 DIAGNOSIS — R350 Frequency of micturition: Secondary | ICD-10-CM | POA: Diagnosis not present

## 2016-08-25 HISTORY — PX: INTERSTIM IMPLANT PLACEMENT: SHX5130

## 2016-10-04 ENCOUNTER — Other Ambulatory Visit: Payer: Self-pay | Admitting: Family Medicine

## 2016-10-04 DIAGNOSIS — E559 Vitamin D deficiency, unspecified: Secondary | ICD-10-CM

## 2016-10-04 DIAGNOSIS — E1165 Type 2 diabetes mellitus with hyperglycemia: Principal | ICD-10-CM

## 2016-10-04 DIAGNOSIS — IMO0001 Reserved for inherently not codable concepts without codable children: Secondary | ICD-10-CM

## 2016-10-12 ENCOUNTER — Encounter (HOSPITAL_COMMUNITY): Payer: Self-pay | Admitting: Vascular Surgery

## 2016-10-12 ENCOUNTER — Emergency Department (HOSPITAL_COMMUNITY)
Admission: EM | Admit: 2016-10-12 | Discharge: 2016-10-12 | Disposition: A | Discharge status: Home / Self Care | Payer: Medicare Other | Attending: Emergency Medicine | Admitting: Emergency Medicine

## 2016-10-12 DIAGNOSIS — D649 Anemia, unspecified: Secondary | ICD-10-CM | POA: Diagnosis not present

## 2016-10-12 DIAGNOSIS — E119 Type 2 diabetes mellitus without complications: Secondary | ICD-10-CM | POA: Insufficient documentation

## 2016-10-12 DIAGNOSIS — B0229 Other postherpetic nervous system involvement: Secondary | ICD-10-CM | POA: Diagnosis not present

## 2016-10-12 DIAGNOSIS — R21 Rash and other nonspecific skin eruption: Secondary | ICD-10-CM

## 2016-10-12 DIAGNOSIS — Z79899 Other long term (current) drug therapy: Secondary | ICD-10-CM | POA: Insufficient documentation

## 2016-10-12 DIAGNOSIS — Z7984 Long term (current) use of oral hypoglycemic drugs: Secondary | ICD-10-CM | POA: Diagnosis not present

## 2016-10-12 DIAGNOSIS — J45909 Unspecified asthma, uncomplicated: Secondary | ICD-10-CM | POA: Diagnosis not present

## 2016-10-12 DIAGNOSIS — I10 Essential (primary) hypertension: Secondary | ICD-10-CM | POA: Insufficient documentation

## 2016-10-12 MED ORDER — GABAPENTIN 300 MG PO CAPS: 300.0000 mg | ORAL_CAPSULE | Freq: Three times a day (TID) | ORAL | 0 refills | Status: DC

## 2016-10-12 NOTE — ED Provider Notes (Signed)
[facility] DEPT Provider Note   CSN: [id number] Arrival date & time: 10/12/16  1544   By signing my name below, I, [name], attest that this documentation has been prepared under the direction and in the presence of Liu, Eugenia Mcalpine, MD. Electronically Signed: Soijett [name], ED Scribe. 10/12/16. 4:31 PM.  History   Chief Complaint Chief Complaint  Patient presents with  . Rash    HPI [name] is a 71 y.o. female with a PMHx of DM, HTN, who presents to the Emergency Department complaining of gradually worsening, rash localized to back onset 7 months ago. Pt has tried hydrocortisone cream with no relief of her symptoms. She states that she was dx with shingles at [facility] 7 months ago and was prescribed valtrex and capsaicin cream. Pt reports that she followed up with her PCP following the initial diagnosis 6 months ago and was prescribed capsaicin cream. She denies fever, heat exposure to the affected area, and any other symptoms.    Per pt chart review: Pt was seen at [facility] on 05/04/2016 for rash localized to back. Pt was Rx valtrex and capsaicin cream for their symptoms. Pt was also seen at her PCP office on 05/12/2016 for similar symptoms and prescribed capsaicin cream.     The history is provided by the patient. No language interpreter was used.    Past Medical History:  Diagnosis Date  . Allergy   . Asthma   . Blood transfusion without reported diagnosis 12/23/2004   with colon cancer  . Brain tumor (benign) ([place])   . Colon cancer [facility]) Nov. 27,  2006  . Diabetes mellitus    Type 2  . Female bladder prolapse   . Gastritis   . GERD (gastroesophageal reflux disease)   . Glaucoma   . Hypercholesterolemia   . Hypertension     Patient Active Problem List   Diagnosis Date Noted  . Constipation 03/14/2016  . Rash and nonspecific skin eruption 03/14/2016  . Health care maintenance 03/14/2016  . Hypovitaminosis D 08/03/2015  . Multiple falls  07/20/2015  . Memory difficulties 07/20/2015  . Dysphagia 11/17/2014  . Bradycardia 12/08/2013  . Unintended weight loss 11/19/2013  . Urine incontinence 03/07/2012  . Pruritus 01/09/2012  . Hematuria - cause not known 01/09/2012  . VERTIGO, CHRONIC 06/16/2009  . DM (diabetes mellitus), type 2, uncontrolled ([place]) 03/16/2007  . MENINGIOMA 02/07/2007  . HYPERTRIGLYCERIDEMIA 07/14/2006  . HYPERTENSION, BENIGN ESSENTIAL 07/14/2006  . RHINITIS, ALLERGIC NOS 07/14/2006  . History of colon cancer 06/08/2006  . ANEMIA, IRON DEFICIENCY, UNSPEC. 06/08/2006  . Anxiety state 06/08/2006  . DEPRESSIVE DISORDER, NOS 06/08/2006  . CAD (coronary artery disease) 06/08/2006  . GASTROESOPHAGEAL REFLUX, NO ESOPHAGITIS 06/08/2006  . FIBROADENOSIS, BREAST 06/08/2006  . EXTERNAL HEMORRHOIDS 10/23/2001  . COLONIC POLYPS, ADENOMATOUS, BENIGN 10/23/2001    Past Surgical History:  Procedure Laterality Date  . ABDOMINAL HYSTERECTOMY  1976  . bladder tac    . CARDIAC CATHETERIZATION  2005  . CATARACT EXTRACTION, BILATERAL    . COLONOSCOPY W/ BIOPSIES    . ESOPHAGEAL MANOMETRY N/A 03/20/2015   Procedure: ESOPHAGEAL MANOMETRY (EM);  Surgeon: Gatha Mayer, MD;  Location: [facility];  Service: Endoscopy;  Laterality: N/A;  . EYE SURGERY     mole removed left eye  . LAPAROSCOPIC NISSEN FUNDOPLICATION  2637  . repair prolapsedbladder    . RIGHT COLECTOMY  Nov. 27, 2006  . TMJ ARTHROPLASTY    . UPPER GASTROINTESTINAL  ENDOSCOPY      OB History    No data available       Home Medications    Prior to Admission medications   Medication Sig Start Date End Date Taking? Authorizing Provider  amLODipine (NORVASC) 2.5 MG tablet take 1 tablet by mouth once daily 05/23/16   Mayo, Pete Pelt, MD  aspirin 81 MG chewable tablet Chew 81 mg by mouth daily.      [provider]  atorvastatin (LIPITOR) 40 MG tablet Take 1 tablet (40 mg total) by mouth daily. 11/18/15   Lind Covert, MD    Capsaicin-Menthol-Methyl Sal (CAPSAICIN-METHYL SAL-MENTHOL) 0.025-1-12 % CREA Apply 1 application topically 4 (four) times daily as needed. 05/12/16   Rumley, [place] N, DO  Dulaglutide (TRULICITY) 9.32 IZ/1.2WP SOPN Inject 0.75 mg into the skin once a week. 03/14/16   Mayo, Pete Pelt, MD  esomeprazole (NEXIUM) 40 MG capsule take 1 capsule by mouth once daily 07/18/16   Mayo, Pete Pelt, MD  gabapentin (NEURONTIN) 300 MG capsule Take 1 capsule (300 mg total) by mouth 3 (three) times daily. On day 1, take 300 mg (1 tablet) once. On day 2, take 300 mg (1 tablet) two times daily. From day 3 onward, take 300 mg (1 tablet) three times daily. 10/12/16   Forde Dandy, MD  glipiZIDE (GLUCOTROL) 10 MG tablet take 1 tablet by mouth twice a day before meals 05/23/16   Mayo, Pete Pelt, MD  glucose blood test strip Check sugars three times daily 07/16/14   Street, Sharon Mt, MD  ibuprofen (ADVIL,MOTRIN) 200 MG tablet Take 2 tablets (400 mg total) by mouth every 6 (six) hours as needed (pain). 10/07/15   Mesner, Corene Cornea, MD  metoCLOPramide (REGLAN) 5 MG tablet Take 1 tablet (5 mg total) by mouth 3 (three) times daily before meals. 11/18/15   Lind Covert, MD  nitrofurantoin (MACRODANTIN) 100 MG capsule Take 100 mg by mouth 4 (four) times daily.    [provider]  nitroGLYCERIN (NITROSTAT) 0.4 MG SL tablet Place 1 tablet (0.4 mg total) under the tongue every 5 (five) minutes as needed. If chest pain not relieved by third tab call 911. 11/24/14   Olam Idler, MD  quinapril-hydrochlorothiazide (ACCURETIC) 20-12.5 MG tablet take 2 tablets by mouth once daily 10/05/16   Mayo, Pete Pelt, MD  RA P COL-RITE 8.6-50 MG tablet take 1 tablet by mouth twice a day 07/06/16   Mayo, Pete Pelt, MD  Travoprost, BAK Free, (TRAVATAN) 0.004 % SOLN ophthalmic solution Place 1 drop into both eyes at bedtime.    [provider]  triamcinolone ointment (KENALOG) 0.5 % Apply 1 application topically 2 (two) times  daily. Patient not taking: Reported on 07/14/2016 03/21/16   Mayo, Pete Pelt, MD    Family History Family History  Problem Relation Age of Onset  . Lymphoma Other   . Bone cancer Father   . Diabetes Daughter   . Hypertension Daughter   . Heart attack Daughter   . Suicidality Son        deceased  . Colon cancer Neg Hx   . Stomach cancer Neg Hx     Social History Social History  Substance Use Topics  . Smoking status: Never Smoker  . Smokeless tobacco: Never Used  . Alcohol use No     Allergies   Metformin and related   Review of Systems Review of Systems 10/14 systems reviewed and are negative other than those stated in the  HPI  Physical Exam Updated Vital Signs BP (!) 180/68 (BP Location: Left Arm)   Pulse 77   Temp 98 F (36.7 C) (Oral)   Resp 16   Ht 5\' 4"  (1.626 m)   Wt 142 lb (64.4 kg)   SpO2 100%   BMI 24.37 kg/m   Physical Exam  Constitutional: She is oriented to person, place, and time. She appears well-developed and well-nourished. No distress.  HENT:  Head: Normocephalic and atraumatic.  Eyes: EOM are normal.  Neck: Neck supple.  Cardiovascular: Normal rate.   Pulmonary/Chest: Effort normal. No respiratory distress.  Abdominal: She exhibits no distension.  Musculoskeletal: Normal range of motion.  Neurological: She is alert and oriented to person, place, and time.  Skin: Skin is warm and dry.  Over the upper back there is hyperpigmentation with healed over vesicular lesions. No erythema, warmth, induration, or drainage.   Psychiatric: She has a normal mood and affect. Her behavior is normal.  Nursing note and vitals reviewed.  ED Treatments / Results  DIAGNOSTIC STUDIES: Oxygen Saturation is 100% on RA, nl by my interpretation.    COORDINATION OF CARE: 4:17 PM Discussed treatment plan with pt at bedside which includes gabapentin for likely post herpetic neuralgia and pt agreed to plan.   Labs (all labs ordered are listed, but only  abnormal results are displayed) Labs Reviewed - No data to display  EKG  EKG Interpretation None       Radiology No results found.  Procedures Procedures (including critical care time)  Medications Ordered in ED Medications - No data to display   Initial Impression / Assessment and Plan / ED Course  I have reviewed the triage vital signs and the nursing notes.  Pertinent labs & imaging results that were available during my care of the patient were reviewed by me and considered in my medical decision making (see chart for details).     71 year old female who presents with persistent rash and pain after shingles treatment in January. She is well-appearing, afebrile. There is hyperpigmented rash in the upper back with what looks like old healed over vesicular lesions. No active vesicles or active infection. Possible postherpetic neuralgia pain, we'll prescribe gabapentin. I discussed close follow-up with her primary care doctor. Strict return and follow-up instructions reviewed. She expressed understanding of all discharge instructions and felt comfortable with the plan of care.   Final Clinical Impressions(s) / ED Diagnoses   Final diagnoses:  Rash  Postherpetic neuralgia    New Prescriptions Discharge Medication List as of 10/12/2016  4:23 PM    START taking these medications   Details  gabapentin (NEURONTIN) 300 MG capsule Take 1 capsule (300 mg total) by mouth 3 (three) times daily. On day 1, take 300 mg (1 tablet) once. On day 2, take 300 mg (1 tablet) two times daily. From day 3 onward, take 300 mg (1 tablet) three times daily., Starting Wed 10/12/2016, Print       I personally performed the services described in this documentation, which was scribed in my presence. The recorded information has been reviewed and is accurate.     Forde Dandy, MD 10/12/16 [phone]

## 2016-10-12 NOTE — Discharge Instructions (Signed)
Your pain seems consistent with nerve pain after shingles infection.  Please take nerve medication as prescribed.  Please follow-up closely with your primary care provider for recheck in 1-2 weeks  Please return without fail for worsening symptoms, including fever, pus drainage, increased redness/swelling, or any other symptoms concerning to you.

## 2016-10-12 NOTE — ED Triage Notes (Signed)
Pt reports to the ED for eval of rash to back. She states that she has had the rash since Jan 24th and last night it became painful. She states that then pain is worse when she gets hot out. Area of discoloration noted to upper back in between shoulder blades. No blistering noted. She states that she was told it was shingles but she was never able to get her medication.

## 2016-11-18 ENCOUNTER — Other Ambulatory Visit: Payer: Self-pay | Admitting: Internal Medicine

## 2016-11-18 DIAGNOSIS — IMO0001 Reserved for inherently not codable concepts without codable children: Secondary | ICD-10-CM

## 2016-11-18 DIAGNOSIS — E559 Vitamin D deficiency, unspecified: Secondary | ICD-10-CM

## 2016-11-18 DIAGNOSIS — E1165 Type 2 diabetes mellitus with hyperglycemia: Principal | ICD-10-CM

## 2016-12-13 ENCOUNTER — Ambulatory Visit (INDEPENDENT_AMBULATORY_CARE_PROVIDER_SITE_OTHER): Payer: Medicare Other | Admitting: Internal Medicine

## 2016-12-13 ENCOUNTER — Encounter: Payer: Self-pay | Admitting: Internal Medicine

## 2016-12-13 VITALS — BP 132/80 | HR 77 | Temp 98.7°F | Wt 144.0 lb

## 2016-12-13 DIAGNOSIS — B0229 Other postherpetic nervous system involvement: Secondary | ICD-10-CM | POA: Diagnosis not present

## 2016-12-13 DIAGNOSIS — E784 Other hyperlipidemia: Secondary | ICD-10-CM | POA: Diagnosis not present

## 2016-12-13 DIAGNOSIS — E1165 Type 2 diabetes mellitus with hyperglycemia: Secondary | ICD-10-CM

## 2016-12-13 DIAGNOSIS — IMO0001 Reserved for inherently not codable concepts without codable children: Secondary | ICD-10-CM

## 2016-12-13 DIAGNOSIS — Z1159 Encounter for screening for other viral diseases: Secondary | ICD-10-CM

## 2016-12-13 DIAGNOSIS — E781 Pure hyperglyceridemia: Secondary | ICD-10-CM | POA: Diagnosis not present

## 2016-12-13 DIAGNOSIS — Z0000 Encounter for general adult medical examination without abnormal findings: Secondary | ICD-10-CM | POA: Diagnosis not present

## 2016-12-13 DIAGNOSIS — I10 Essential (primary) hypertension: Secondary | ICD-10-CM

## 2016-12-13 DIAGNOSIS — Z23 Encounter for immunization: Secondary | ICD-10-CM

## 2016-12-13 DIAGNOSIS — E7849 Other hyperlipidemia: Secondary | ICD-10-CM

## 2016-12-13 LAB — POCT GLYCOSYLATED HEMOGLOBIN (HGB A1C): Hemoglobin A1C: 9.8

## 2016-12-13 MED ORDER — TRIAMCINOLONE ACETONIDE 0.1 % EX OINT: 1.0000 "application " | TOPICAL_OINTMENT | Freq: Two times a day (BID) | CUTANEOUS | 0 refills | Status: DC

## 2016-12-13 MED ORDER — EMPAGLIFLOZIN 10 MG PO TABS: 10.0000 mg | ORAL_TABLET | Freq: Every day | ORAL | 0 refills | Status: DC

## 2016-12-13 NOTE — Assessment & Plan Note (Signed)
Well-controlled. BP 132/80. Goal <140/<90. - Continue Norvasc 2.5mg  daily and Quinapril-HCTZ 20-12.5mg  2 tabs daily - Check BMP - Follow-up in 3 months

## 2016-12-13 NOTE — Assessment & Plan Note (Signed)
Patient with continued pain and itchiness of the right upper back where she had shingles 9 months ago. No signs of infection or active vesicles. No overlying cellulitis. - Refilled Triamcinolone bid, as patient states it is the only thing that has helped her - Stop Gabapentin, because it hasn't helped at all and has just caused her to be sleepy - Follow-up if worsening

## 2016-12-13 NOTE — Assessment & Plan Note (Signed)
Controlled. Last lipid panel 07/2015 with Chol 164, HDL 43, LDL 47, TG 369. - Continue Lipitor 40mg  daily - Check lipid panel

## 2016-12-13 NOTE — Progress Notes (Signed)
[facility] Phone: [phone]  Subjective:  [name] is a 71 year old female presenting to clinic for follow-up of her shingles, T2DM, HTN, and hypertriglyceridemia.  Shingles: Diagnosed 1/24 with shingles. Completed a 7 day course of Valtrex, which helped the rash. She was seen again in clinic on 2/1 with post-herpetic neuralgia and was prescribed Capsaicin cream, which helped a little. She was seen again on 7/4 in the ED for continued pain. She was prescribed Gabapentin, which didn't help at all. She states the Gabapentin just made her sleepy. The rash is located on her right upper back. The rash continues to be painful. The pain is constant. She states she has just had to get used to the pain. The rash is also itchy. The rash is not spreading, but is not getting any smaller. She has recently started putting Triamcinolone cream on the rash, which has helped the most of anything she has tried. She is requesting a refill of the Triamcinolone cream.  T2DM: Previously prescribed Trulicity, but she stopped taking this in December of last year because it made her incredibly itchy. She is taking Glipizide 10mg  daily, which she has been on for > 5 years. Checking her blood sugars once daily. Have been ranging from 130s-150s. Denies any shakiness or sweatiness. No polyuria or polydipsia. States she has been drinking more water recently. She has cut back on sweet tea and soda and is now only drinking one glass daily. She states she rarely eats sweets or desserts.  HTN: Taking Quinapril-HCTZ daily and Norvasc daily. No side effects. No chest pain, no shortness of breath, no lower extremity edema. No dizziness.  Hypertriglyceridemia: Taking Lipitor 40mg  daily. No side effects. No RUQ pain, no muscle pain.  ROS: See HPI for pertinent positives and negatives  Past Medical History- HTN, CAD, T2DM, hx colon cancer, HLD, anxiety, depression  Family history reviewed for today's visit.  No changes.  Social history- patient is a never smoker  Objective: BP 132/80   Pulse 77   Temp 98.7 F (37.1 C) (Oral)   Wt 144 lb (65.3 kg)   SpO2 99%   BMI 24.72 kg/m  Gen: NAD, alert, cooperative with exam HEENT: NCAT, EOMI, MMM Neck: FROM, supple CV: RRR, no murmur Resp: CTABL, no wheezes, normal work of breathing Msk: No edema, warm, normal tone, moves UE/LE spontaneously Neuro: Alert and oriented, no gross deficits Skin: 16cm x 8 cm oval area of patchy hyperpigmented skin present on the right upper back/shoulder. No vesicles present. No erythema, no warmth, no drainage present. Psych: Appropriate behavior  Assessment/Plan: Post-herpetic neuralgia: Patient with continued pain and itchiness of the right upper back where she had shingles 9 months ago. No signs of infection or active vesicles. No overlying cellulitis. - Refilled Triamcinolone bid, as patient states it is the only thing that has helped her - Stop Gabapentin, because it hasn't helped at all and has just caused her to be sleepy - Follow-up if worsening  T2DM: Uncontrolled. Last A1c 8.4% on 03/2016. Patient stopped taking Trulicity 70/4888 because it caused itchiness. Only on Glipizide and has been on this for >5 years. Unable to tolerate Metformin in the past. Goal A1c <8% due to patient age. - Repeat A1c 9.8% today. - Stop Glipizide, as it is likely no longer effective after >5 years and patient is at risk of hypoglycemic-related complications due to her age - Start Jardiance 10mg  daily - Referral placed to ophthalmology - Follow-up in 3 months  for repeat A1c  HTN: Well-controlled. BP 132/80. Goal <140/<90. - Continue Norvasc 2.5mg  daily and Quinapril-HCTZ 20-12.5mg  2 tabs daily - Check BMP - Follow-up in 3 months  Hypertriglyceridemia: Controlled. Last lipid panel 07/2015 with Chol 164, HDL 43, LDL 47, TG 369. - Continue Lipitor 40mg  daily - Check lipid panel  Health Care Maintenance: - Flu shot  given today - Hepatitis C screening ordered    [facility] Bible, MD PGY-3

## 2016-12-13 NOTE — Patient Instructions (Signed)
It was so nice to see you!  Please STOP taking the Glipizide. I have switched you to a new diabetes medication called Jardiance. Please take 1 tablet daily.   I have also checked some labs for you. I will call you with these results.  I have also sent in a referral to the eye doctor. You should hear from our office in the next 2 weeks to schedule this appointment.  We will see you back in 3 months to recheck your A1c.  -Dr. Brett Albino

## 2016-12-13 NOTE — Assessment & Plan Note (Signed)
-   Flu shot given today - Hepatitis C screening ordered

## 2016-12-13 NOTE — Assessment & Plan Note (Signed)
Uncontrolled. Last A1c 8.4% on 03/2016. Patient stopped taking Trulicity 57/4734 because it caused itchiness. Only on Glipizide and has been on this for >5 years. Unable to tolerate Metformin in the past. Goal A1c <8% due to patient age. - Repeat A1c 9.8% today. - Stop Glipizide, as it is likely no longer effective after >5 years and patient is at risk of hypoglycemic-related complications due to her age - Start Jardiance 10mg  daily - Referral placed to ophthalmology - Follow-up in 3 months for repeat A1c

## 2016-12-14 LAB — BASIC METABOLIC PANEL
BUN/Creatinine Ratio: 17 (ref 12–28)
BUN: 21 mg/dL (ref 8–27)
CO2: 24 mmol/L (ref 20–29)
Calcium: 9.5 mg/dL (ref 8.7–10.3)
Chloride: 100 mmol/L (ref 96–106)
Creatinine, Ser: 1.23 mg/dL — ABNORMAL HIGH (ref 0.57–1.00)
GFR calc Af Amer: 51 mL/min/{1.73_m2} — ABNORMAL LOW (ref >59)
GFR calc non Af Amer: 45 mL/min/{1.73_m2} — ABNORMAL LOW (ref >59)
Glucose: 297 mg/dL — ABNORMAL HIGH (ref 65–99)
Potassium: 3.8 mmol/L (ref 3.5–5.2)
Sodium: 142 mmol/L (ref 134–144)

## 2016-12-14 LAB — LIPID PANEL
Chol/HDL Ratio: 3.7 ratio (ref 0.0–4.4)
Cholesterol, Total: 152 mg/dL (ref 100–199)
HDL: 41 mg/dL (ref >39)
LDL Calculated: 49 mg/dL (ref 0–99)
Triglycerides: 308 mg/dL — ABNORMAL HIGH (ref 0–149)
VLDL Cholesterol Cal: 62 mg/dL — ABNORMAL HIGH (ref 5–40)

## 2016-12-14 LAB — HEPATITIS C ANTIBODY: Hep C Virus Ab: <0.1 s/co ratio (ref 0.0–0.9)

## 2016-12-16 ENCOUNTER — Encounter: Payer: Self-pay | Admitting: Internal Medicine

## 2016-12-30 ENCOUNTER — Telehealth: Payer: Self-pay | Admitting: *Deleted

## 2016-12-30 NOTE — Telephone Encounter (Signed)
Patient called request something for pain. Patient stating she is having kidney problems. Patient has been drinking plenty of water. Advised patient that she should be seen in the ED, urgent care or call after hours line if patient is really bad. Will forward to PCP.  Derl Barrow, RN

## 2017-01-02 ENCOUNTER — Encounter (HOSPITAL_COMMUNITY): Payer: Self-pay | Admitting: *Deleted

## 2017-01-02 ENCOUNTER — Emergency Department (HOSPITAL_COMMUNITY)
Admission: EM | Admit: 2017-01-02 | Discharge: 2017-01-02 | Disposition: A | Discharge status: Home / Self Care | Payer: Medicare Other | Attending: Emergency Medicine | Admitting: Emergency Medicine

## 2017-01-02 DIAGNOSIS — M545 Low back pain, unspecified: Secondary | ICD-10-CM

## 2017-01-02 DIAGNOSIS — Z79899 Other long term (current) drug therapy: Secondary | ICD-10-CM | POA: Insufficient documentation

## 2017-01-02 DIAGNOSIS — J45909 Unspecified asthma, uncomplicated: Secondary | ICD-10-CM | POA: Insufficient documentation

## 2017-01-02 DIAGNOSIS — I2510 Atherosclerotic heart disease of native coronary artery without angina pectoris: Secondary | ICD-10-CM | POA: Insufficient documentation

## 2017-01-02 DIAGNOSIS — R1031 Right lower quadrant pain: Secondary | ICD-10-CM | POA: Insufficient documentation

## 2017-01-02 DIAGNOSIS — Z7982 Long term (current) use of aspirin: Secondary | ICD-10-CM | POA: Insufficient documentation

## 2017-01-02 DIAGNOSIS — E119 Type 2 diabetes mellitus without complications: Secondary | ICD-10-CM | POA: Insufficient documentation

## 2017-01-02 DIAGNOSIS — Z85038 Personal history of other malignant neoplasm of large intestine: Secondary | ICD-10-CM | POA: Insufficient documentation

## 2017-01-02 DIAGNOSIS — I10 Essential (primary) hypertension: Secondary | ICD-10-CM | POA: Diagnosis not present

## 2017-01-02 DIAGNOSIS — Z7984 Long term (current) use of oral hypoglycemic drugs: Secondary | ICD-10-CM | POA: Diagnosis not present

## 2017-01-02 LAB — CBC
HCT: 35.9 % — ABNORMAL LOW (ref 36.0–46.0)
Hemoglobin: 11.9 g/dL — ABNORMAL LOW (ref 12.0–15.0)
MCH: 28.4 pg (ref 26.0–34.0)
MCHC: 33.1 g/dL (ref 30.0–36.0)
MCV: 85.7 fL (ref 78.0–100.0)
Platelets: 250 10*3/uL (ref 150–400)
RBC: 4.19 MIL/uL (ref 3.87–5.11)
RDW: 12.8 % (ref 11.5–15.5)
WBC: 8.6 10*3/uL (ref 4.0–10.5)

## 2017-01-02 LAB — COMPREHENSIVE METABOLIC PANEL
ALT: 13 U/L — ABNORMAL LOW (ref 14–54)
AST: 15 U/L (ref 15–41)
Albumin: 4 g/dL (ref 3.5–5.0)
Alkaline Phosphatase: 68 U/L (ref 38–126)
Anion gap: 11 (ref 5–15)
BUN: 35 mg/dL — ABNORMAL HIGH (ref 6–20)
CO2: 26 mmol/L (ref 22–32)
Calcium: 9.8 mg/dL (ref 8.9–10.3)
Chloride: 101 mmol/L (ref 101–111)
Creatinine, Ser: 1.6 mg/dL — ABNORMAL HIGH (ref 0.44–1.00)
GFR calc Af Amer: 37 mL/min — ABNORMAL LOW (ref >60)
GFR calc non Af Amer: 32 mL/min — ABNORMAL LOW (ref >60)
Glucose, Bld: 265 mg/dL — ABNORMAL HIGH (ref 65–99)
Potassium: 3.4 mmol/L — ABNORMAL LOW (ref 3.5–5.1)
Sodium: 138 mmol/L (ref 135–145)
Total Bilirubin: 0.9 mg/dL (ref 0.3–1.2)
Total Protein: 7.7 g/dL (ref 6.5–8.1)

## 2017-01-02 LAB — URINALYSIS, ROUTINE W REFLEX MICROSCOPIC
Bacteria, UA: NONE SEEN
Bilirubin Urine: NEGATIVE
Glucose, UA: >=500 mg/dL — AB
Hgb urine dipstick: NEGATIVE
Ketones, ur: 5 mg/dL — AB
Leukocytes, UA: NEGATIVE
Nitrite: NEGATIVE
Protein, ur: NEGATIVE mg/dL
Specific Gravity, Urine: 1.026 (ref 1.005–1.030)
pH: 5 (ref 5.0–8.0)

## 2017-01-02 LAB — LIPASE, BLOOD: Lipase: 55 U/L — ABNORMAL HIGH (ref 11–51)

## 2017-01-02 LAB — CBG MONITORING, ED: Glucose-Capillary: 245 mg/dL — ABNORMAL HIGH (ref 65–99)

## 2017-01-02 MED ORDER — OXYCODONE-ACETAMINOPHEN 5-325 MG PO TABS
1.0000 | ORAL_TABLET | Freq: Once | ORAL | Status: AC
  Administered 2017-01-02: 1 via ORAL
  Filled 2017-01-02: qty 1

## 2017-01-02 MED ORDER — TRAMADOL HCL 50 MG PO TABS: 50.0000 mg | ORAL_TABLET | Freq: Four times a day (QID) | ORAL | 0 refills | Status: DC | PRN

## 2017-01-02 NOTE — Telephone Encounter (Signed)
Attempted to contact pt to schedule an apt. No answer or option for VM. I will contain to try.

## 2017-01-02 NOTE — Telephone Encounter (Signed)
Agree that patient should be seen for this. Cannot call in any pain medications when we do not know what we are treating. Thanks!

## 2017-01-02 NOTE — Discharge Instructions (Signed)
Follow-up with your PCP.

## 2017-01-02 NOTE — ED Triage Notes (Addendum)
Pt is here pain to right kidney area pain and has shingles on upper back and appears dry.  Pt also complaints of metal plates to lower back and it is hurting her.  Pt is also here with syncopal events over the last 4 months

## 2017-01-03 ENCOUNTER — Ambulatory Visit (INDEPENDENT_AMBULATORY_CARE_PROVIDER_SITE_OTHER): Payer: Medicare Other | Admitting: Internal Medicine

## 2017-01-03 ENCOUNTER — Encounter: Payer: Self-pay | Admitting: Internal Medicine

## 2017-01-03 VITALS — BP 130/78 | HR 69 | Temp 98.3°F | Wt 139.0 lb

## 2017-01-03 DIAGNOSIS — R21 Rash and other nonspecific skin eruption: Secondary | ICD-10-CM

## 2017-01-03 DIAGNOSIS — S39012A Strain of muscle, fascia and tendon of lower back, initial encounter: Secondary | ICD-10-CM | POA: Diagnosis not present

## 2017-01-03 DIAGNOSIS — B354 Tinea corporis: Secondary | ICD-10-CM | POA: Diagnosis not present

## 2017-01-03 LAB — POCT SKIN KOH: Skin KOH, POC: POSITIVE — AB

## 2017-01-03 MED ORDER — BACLOFEN 10 MG PO TABS: 10.0000 mg | ORAL_TABLET | Freq: Three times a day (TID) | ORAL | 0 refills | Status: DC

## 2017-01-03 MED ORDER — CLOTRIMAZOLE 1 % EX CREA: 1.0000 "application " | TOPICAL_CREAM | Freq: Two times a day (BID) | CUTANEOUS | 0 refills | Status: DC

## 2017-01-03 NOTE — Progress Notes (Signed)
[facility] Phone: [phone]  Subjective:  [name] is a 71 year old female presenting to clinic with low back pain and rash on back.  Low Back Pain: Occurs on the right side of her back. Has been present for 2 months. Feels like it's getting worse. The pain feels like "a big sore". Pain is worse with bending over, such as when she is picking something off the ground or doing the dishes. Nothing makes the pain feel better. The pain does not radiate. She notes that the pain makes it hard to sleep at night because she can't get comfortable. She has not tried taking any medications for this. She has been prescribed Tramadol by the ED, but it didn't help so she isn't taking it. No fevers, no chills, no bowel/bladder incontinence, no saddle anesthesia. She endorses numbness of both legs. She also endorses weakness in both her legs.  Rash on back: Started in 04/2016. She was diagnosed with shingles at the time. She was prescribed valtrex and capsaicin cream. Over the last 8 months, she has also been prescribed Hydrocortisone cream and Triamcinolone ointment. She notes that the rash is itchy and burning. At our last visit on 9/4, she stated the Triamcinolone ointment was the only thing that helped her, so she was given another refill of this. She returns today because the Triamcinolone ointment is not helping. She feels like the itching and burning worsened three months ago. The rash has not spread. Her granddaughter has been taking care of it. Her granddaughter has been putting soap, alcohol, peroxide, triamcinolone ointment, and vaseline on the rash, which haven't been helping. Her granddaughter has noticed that over the last week, some areas of her back have started peeling.  ROS: See HPI for pertinent positives and negatives  Past Medical History- HTN, CAD, uncontrolled T2DM, depression, anemia, HLD, memory difficulties, hx of colon cancer  Family history reviewed for today's  visit. No changes.  Social history- patient is a never smoker  Objective: BP 130/78   Pulse 69   Temp 98.3 F (36.8 C) (Oral)   Wt 139 lb (63 kg)   SpO2 98%   BMI 23.86 kg/m  Gen: NAD, alert, cooperative with exam Back: No midline tenderness to palpation, +tenderness to palpation over the right paraspinal muscle of the lumbar spine, decreased ROM in all directions,  Neuro: Alert and oriented, 5/5 muscle strength in lower extremities bilaterally, sensation intact to light touch in the lower extremities bilaterally, reflexes normal and symmetric, straight leg raise negative. Skin: Hyperpigmented macules present on the right upper back, extending across mildline to the left upper back; overlying excoriations present; right upper back with two circular 3cm x 3cm lesions with a raised border.  Assessment/Plan: Lumbosacral Strain: Patient with isolated right sided low back pain, worse with bending over. She has focal tenderness over the right paraspinal muscle, consistent with lumbosacral strain. Straight leg raise negative, making herniated disc less likely. No red flags to suggest infection or malignancy. Seen yesterday in the ED and had a normal UA, so pyelonephritis or nephrolithiasis less likely. - Conservative management with heat and Tylenol - Baclofen tid prn x 10 days to help with muscle spasm - Discussed physical therapy, but patient states she does not have transportation. - Patient given handout for home rehabilitation exercises. - Follow-up in 1 month if no improvement. Could consider x-rays at that time.  Tinea Corporis: Patient diagnosed with shingles 04/2016. Has had some hyperpigmented changes to her right  upper back since then. Was improving, but noticed worsening itching and burning three months ago. Today, she has two new lesions with raised borders on her right upper back, consistent with tinea. - KOH positive in clinic today - Treat with Clotrimazole cream bid -  Follow-up if no improvement - Precepted with Dr. Wendy Poet   Hyman Bible, MD PGY-3

## 2017-01-03 NOTE — Telephone Encounter (Signed)
Patient has an appointment today with Dr. Brett Albino at 2:50pm. [name]

## 2017-01-03 NOTE — Patient Instructions (Addendum)
It was so nice to see you!  For your rash- I have prescribed an anti-fungal cream called Clotrimazole. Please use this twice a day until the rash goes away.   I have also prescribed a muscle relaxer called Baclofen. You should use this three times a day as needed.  Please try some of the home exercises that I have given you.  -Dr. Brett Albino

## 2017-01-04 DIAGNOSIS — B354 Tinea corporis: Secondary | ICD-10-CM | POA: Insufficient documentation

## 2017-01-04 DIAGNOSIS — S39012A Strain of muscle, fascia and tendon of lower back, initial encounter: Secondary | ICD-10-CM | POA: Insufficient documentation

## 2017-01-04 NOTE — Assessment & Plan Note (Signed)
Patient with isolated right sided low back pain, worse with bending over. She has focal tenderness over the right paraspinal muscle, consistent with lumbosacral strain. Straight leg raise negative, making herniated disc less likely. No red flags to suggest infection or malignancy. Seen yesterday in the ED and had a normal UA, so pyelonephritis or nephrolithiasis less likely. - Conservative management with heat and Tylenol - Baclofen tid prn x 10 days to help with muscle spasm - Discussed physical therapy, but patient states she does not have transportation. - Patient given handout for home rehabilitation exercises. - Follow-up in 1 month if no improvement. Could consider x-rays at that time.

## 2017-01-04 NOTE — Assessment & Plan Note (Signed)
Patient diagnosed with shingles 04/2016. Has had some hyperpigmented changes to her right upper back since then. Was improving, but noticed worsening itching and burning three months ago. Today, she has two new lesions with raised borders on her right upper back, consistent with tinea. - KOH positive in clinic today - Treat with Clotrimazole cream bid - Follow-up if no improvement - Precepted with Dr. Wendy Poet

## 2017-01-06 ENCOUNTER — Telehealth: Payer: Self-pay | Admitting: *Deleted

## 2017-01-06 ENCOUNTER — Other Ambulatory Visit: Payer: Self-pay | Admitting: Family Medicine

## 2017-01-06 NOTE — Telephone Encounter (Signed)
Will forward to MD. Rachele Lamaster,CMA  

## 2017-01-06 NOTE — Telephone Encounter (Signed)
Patient called stating the Baclofen was causing her to be very frigidity, hands/arms twitching.  Advised patient to stop the medication. Last dosage was last night. Denies any breathing problems, swelling.  Please advise.  Derl Barrow, RN

## 2017-01-06 NOTE — Telephone Encounter (Signed)
Returned patient's call. Agree with stopping Baclofen to see if this helps her symptoms. Patient will call back with any other questions.

## 2017-01-23 NOTE — ED Provider Notes (Signed)
[facility] DEPT Provider Note   CSN: [id number] Arrival date & time: 01/02/17  0930     History   Chief Complaint Chief Complaint  Patient presents with  . Back Pain  . Abdominal Pain    HPI NAN [name] is a 71 y.o. female.  HPI   71yF with back pain. R mid back. No trauma. Onset days ago. Gradual. Pain constant. Worse with certain movements. No urinary complaints. No numbness or tingling. No change in strength. Shingles to upper back, but says this pain is different.   Past Medical History:  Diagnosis Date  . Allergy   . Asthma   . Blood transfusion without reported diagnosis 12/23/2004   with colon cancer  . Brain tumor (benign) ([place])   . Colon cancer [facility]) Nov. 27,  2006  . Diabetes mellitus    Type 2  . Female bladder prolapse   . Gastritis   . GERD (gastroesophageal reflux disease)   . Glaucoma   . Hypercholesterolemia   . Hypertension     Patient Active Problem List   Diagnosis Date Noted  . Lumbosacral strain 01/04/2017  . Tinea corporis 01/04/2017  . Post herpetic neuralgia 12/13/2016  . Constipation 03/14/2016  . Health care maintenance 03/14/2016  . Memory difficulties 07/20/2015  . Dysphagia 11/17/2014  . Bradycardia 12/08/2013  . Urine incontinence 03/07/2012  . Hematuria - cause not known 01/09/2012  . VERTIGO, CHRONIC 06/16/2009  . DM (diabetes mellitus), type 2, uncontrolled ([place]) 03/16/2007  . MENINGIOMA 02/07/2007  . HYPERTRIGLYCERIDEMIA 07/14/2006  . HYPERTENSION, BENIGN ESSENTIAL 07/14/2006  . RHINITIS, ALLERGIC NOS 07/14/2006  . History of colon cancer 06/08/2006  . ANEMIA, IRON DEFICIENCY, UNSPEC. 06/08/2006  . DEPRESSIVE DISORDER, NOS 06/08/2006  . CAD (coronary artery disease) 06/08/2006  . GASTROESOPHAGEAL REFLUX, NO ESOPHAGITIS 06/08/2006  . FIBROADENOSIS, BREAST 06/08/2006  . EXTERNAL HEMORRHOIDS 10/23/2001  . COLONIC POLYPS, ADENOMATOUS, BENIGN 10/23/2001    Past Surgical History:  Procedure Laterality Date  .  ABDOMINAL HYSTERECTOMY  1976  . bladder tac    . CARDIAC CATHETERIZATION  2005  . CATARACT EXTRACTION, BILATERAL    . COLONOSCOPY W/ BIOPSIES    . ESOPHAGEAL MANOMETRY N/A 03/20/2015   Procedure: ESOPHAGEAL MANOMETRY (EM);  Surgeon: Gatha Mayer, MD;  Location: [facility];  Service: Endoscopy;  Laterality: N/A;  . EYE SURGERY     mole removed left eye  . LAPAROSCOPIC NISSEN FUNDOPLICATION  8657  . repair prolapsedbladder    . RIGHT COLECTOMY  Nov. 27, 2006  . TMJ ARTHROPLASTY    . UPPER GASTROINTESTINAL ENDOSCOPY      OB History    No data available       Home Medications    Prior to Admission medications   Medication Sig Start Date End Date Taking? Authorizing Provider  amLODipine (NORVASC) 2.5 MG tablet take 1 tablet by mouth once daily 11/18/16   Mayo, Pete Pelt, MD  aspirin 81 MG chewable tablet Chew 81 mg by mouth daily.      [provider]  atorvastatin (LIPITOR) 40 MG tablet take 1 tablet by mouth once daily 01/06/17   Mayo, Pete Pelt, MD  baclofen (LIORESAL) 10 MG tablet Take 1 tablet (10 mg total) by mouth 3 (three) times daily. 01/03/17   Mayo, Pete Pelt, MD  clotrimazole (LOTRIMIN) 1 % cream Apply 1 application topically 2 (two) times daily. 01/03/17   Mayo, Pete Pelt, MD  empagliflozin (JARDIANCE) 10 MG TABS tablet Take 10 mg by mouth daily.  12/13/16   Mayo, Pete Pelt, MD  esomeprazole (NEXIUM) 40 MG capsule take 1 capsule by mouth once daily 07/18/16   Mayo, Pete Pelt, MD  glucose blood test strip Check sugars three times daily 07/16/14   Street, Sharon Mt, MD  ibuprofen (ADVIL,MOTRIN) 200 MG tablet Take 2 tablets (400 mg total) by mouth every 6 (six) hours as needed (pain). 10/07/15   Mesner, Corene Cornea, MD  metoCLOPramide (REGLAN) 5 MG tablet Take 1 tablet (5 mg total) by mouth 3 (three) times daily before meals. 11/18/15   Lind Covert, MD  nitroGLYCERIN (NITROSTAT) 0.4 MG SL tablet Place 1 tablet (0.4 mg total) under the tongue every 5 (five) minutes as  needed. If chest pain not relieved by third tab call 911. 11/24/14   Olam Idler, MD  quinapril-hydrochlorothiazide (ACCURETIC) 20-12.5 MG tablet take 2 tablets by mouth once daily 10/05/16   Mayo, Pete Pelt, MD  RA P COL-RITE 8.6-50 MG tablet take 1 tablet by mouth twice a day 07/06/16   Mayo, Pete Pelt, MD  Travoprost, BAK Free, (TRAVATAN) 0.004 % SOLN ophthalmic solution Place 1 drop into both eyes at bedtime.    [provider]    Family History Family History  Problem Relation Age of Onset  . Lymphoma Other   . Bone cancer Father   . Diabetes Daughter   . Hypertension Daughter   . Heart attack Daughter   . Suicidality Son        deceased  . Colon cancer Neg Hx   . Stomach cancer Neg Hx     Social History Social History  Substance Use Topics  . Smoking status: Never Smoker  . Smokeless tobacco: Never Used  . Alcohol use No     Allergies   Metformin and related   Review of Systems Review of Systems  All systems reviewed and negative, other than as noted in HPI.  Physical Exam Updated Vital Signs BP 136/75 (BP Location: Left Arm)   Pulse 68   Temp 98.6 F (37 C)   Resp 18   SpO2 100%   Physical Exam  Constitutional: She is oriented to person, place, and time. She appears well-developed and well-nourished. No distress.  HENT:  Head: Normocephalic and atraumatic.  Eyes: Conjunctivae are normal. Right eye exhibits no discharge. Left eye exhibits no discharge.  Neck: Neck supple.  Cardiovascular: Normal rate, regular rhythm and normal heart sounds.  Exam reveals no gallop and no friction rub.   No murmur heard. Pulmonary/Chest: Effort normal and breath sounds normal. No respiratory distress.  Abdominal: Soft. She exhibits no distension. There is no tenderness.  Musculoskeletal: She exhibits no edema or tenderness.  Neurological: She is alert and oriented to person, place, and time.  Strength 5/5 b/l LE. Sensation intact to light touch.   Skin: Skin  is warm and dry.  Chronic/subacute appearing  skin changes to upper back consistent with resolving shingles. No midline spinal tenderness. No cva tenderness.   Psychiatric: She has a normal mood and affect. Her behavior is normal. Thought content normal.  Nursing note and vitals reviewed.    ED Treatments / Results  Labs (all labs ordered are listed, but only abnormal results are displayed) Labs Reviewed  LIPASE, BLOOD - Abnormal; Notable for the following:       Result Value   Lipase 55 (*)    All other components within normal limits  COMPREHENSIVE METABOLIC PANEL - Abnormal; Notable for the following:    Potassium  3.4 (*)    Glucose, Bld 265 (*)    BUN 35 (*)    Creatinine, Ser 1.60 (*)    ALT 13 (*)    GFR calc non Af Amer 32 (*)    GFR calc Af Amer 37 (*)    All other components within normal limits  CBC - Abnormal; Notable for the following:    Hemoglobin 11.9 (*)    HCT 35.9 (*)    All other components within normal limits  URINALYSIS, ROUTINE W REFLEX MICROSCOPIC - Abnormal; Notable for the following:    Glucose, UA >=500 (*)    Ketones, ur 5 (*)    Squamous Epithelial / LPF 0-5 (*)    All other components within normal limits  CBG MONITORING, ED - Abnormal; Notable for the following:    Glucose-Capillary 245 (*)    All other components within normal limits    EKG  EKG Interpretation  Date/Time:  Monday January 02 2017 12:10:48 EDT Ventricular Rate:  62 PR Interval:  132 QRS Duration: 84 QT Interval:  428 QTC Calculation: 434 R Axis:   21 Text Interpretation:  Normal sinus rhythm with sinus arrhythmia Normal ECG Confirmed by Orpah Greek [phone]) on 01/03/2017 7:39:36 AM       Radiology No results found.   Procedures Procedures (including critical care time)  Medications Ordered in ED Medications  oxyCODONE-acetaminophen (PERCOCET/ROXICET) 5-325 MG per tablet 1 tablet (1 tablet Oral Given 01/02/17 1444)     Initial Impression /  Assessment and Plan / ED Course  I have reviewed the triage vital signs and the nursing notes.  Pertinent labs & imaging results that were available during my care of the patient were reviewed by me and considered in my medical decision making (see chart for details).     71yF with mid/lower back pain. Nonfocal neuro exam. Afebrile. I doubt emergent cause. Symptomatic tx.   Final Clinical Impressions(s) / ED Diagnoses   Final diagnoses:  Lumbar back pain    New Prescriptions Discharge Medication List as of 01/02/2017  2:48 PM    START taking these medications   Details  traMADol (ULTRAM) 50 MG tablet Take 1 tablet (50 mg total) by mouth every 6 (six) hours as needed., Starting Mon 01/02/2017, Print         Virgel Manifold, MD 01/24/17 1105

## 2017-02-24 ENCOUNTER — Other Ambulatory Visit: Payer: Self-pay

## 2017-02-24 ENCOUNTER — Telehealth: Payer: Self-pay | Admitting: Internal Medicine

## 2017-02-24 NOTE — Telephone Encounter (Signed)
Vicente Males with [facility] called and said pt Dr prescribed lipitor and pt stopped taking it about 3 months ago because of the side effects. Pt said she hasnt spoke to the doctor about it and was wondering if there was another medication that needs to be prescribed or what. Please advise

## 2017-02-24 NOTE — Telephone Encounter (Signed)
Will forward to Md to advise. Jazmin Hartsell,CMA  

## 2017-02-24 NOTE — Patient Outreach (Signed)
[facility]) Care Management  02/24/2017  [name] [birth year] [id number]   Medication Adherence call to [name] patient is showing past due under [facility] Ins.on Atorvastatin 40 mg spoke to patient she said she has stop taking this medication because of side effects ,she has not let doctor [facility] know About it, she has  stop taking this medication because of side effects ,I told patient I was going to get in touch with doctor to see if doctor can prescribe something else instead,call doctor Mayo's office and left a message.   [facility] Direct Dial [phone]  Fax [phone] [email or web address]

## 2017-02-27 ENCOUNTER — Other Ambulatory Visit: Payer: Self-pay | Admitting: Internal Medicine

## 2017-02-27 MED ORDER — ROSUVASTATIN CALCIUM 20 MG PO TABS: 20.0000 mg | ORAL_TABLET | Freq: Every day | ORAL | 3 refills | Status: DC

## 2017-02-27 NOTE — Telephone Encounter (Signed)
Changed patient to Crestor to see if she tolerates this better. New prescription sent to pharmacy.

## 2017-02-27 NOTE — Telephone Encounter (Signed)
Pt contacted and informed of new rx to pharmacy. Medication instructions given and pt instructed to call the office with any side effects.

## 2017-05-20 ENCOUNTER — Other Ambulatory Visit: Payer: Self-pay | Admitting: Internal Medicine

## 2017-05-20 DIAGNOSIS — E1165 Type 2 diabetes mellitus with hyperglycemia: Principal | ICD-10-CM

## 2017-05-20 DIAGNOSIS — E559 Vitamin D deficiency, unspecified: Secondary | ICD-10-CM

## 2017-05-20 DIAGNOSIS — IMO0001 Reserved for inherently not codable concepts without codable children: Secondary | ICD-10-CM

## 2017-07-05 ENCOUNTER — Encounter: Payer: Self-pay | Admitting: Internal Medicine

## 2017-07-05 ENCOUNTER — Ambulatory Visit (INDEPENDENT_AMBULATORY_CARE_PROVIDER_SITE_OTHER): Payer: Medicare Other | Admitting: Internal Medicine

## 2017-07-05 ENCOUNTER — Other Ambulatory Visit: Payer: Self-pay

## 2017-07-05 VITALS — BP 130/62 | HR 83 | Temp 98.5°F | Ht 63.0 in | Wt 134.0 lb

## 2017-07-05 DIAGNOSIS — E1165 Type 2 diabetes mellitus with hyperglycemia: Secondary | ICD-10-CM

## 2017-07-05 DIAGNOSIS — J3089 Other allergic rhinitis: Secondary | ICD-10-CM

## 2017-07-05 LAB — POCT GLYCOSYLATED HEMOGLOBIN (HGB A1C): Hemoglobin A1C: 11

## 2017-07-05 MED ORDER — FLUTICASONE PROPIONATE 50 MCG/ACT NA SUSP: 2.0000 | Freq: Every day | NASAL | 6 refills | Status: DC

## 2017-07-05 MED ORDER — TRIAMCINOLONE 0.1 % CREAM:EUCERIN CREAM 1:1: 1.0000 "application " | TOPICAL_CREAM | Freq: Two times a day (BID) | CUTANEOUS | 0 refills | Status: DC | PRN

## 2017-07-05 MED ORDER — CETIRIZINE HCL 10 MG PO TABS: 10.0000 mg | ORAL_TABLET | Freq: Every day | ORAL | 0 refills | Status: DC

## 2017-07-05 MED ORDER — ONETOUCH DELICA LANCING DEV MISC: 0 refills | Status: DC

## 2017-07-05 MED ORDER — ESOMEPRAZOLE MAGNESIUM 40 MG PO PACK: 40.0000 mg | PACK | Freq: Every day | ORAL | 12 refills | Status: DC

## 2017-07-05 MED ORDER — ONETOUCH DELICA LANCETS 33G MISC: 0 refills | Status: DC

## 2017-07-05 MED ORDER — ONETOUCH VERIO W/DEVICE KIT: 1.0000 | PACK | Freq: Every day | 0 refills | Status: DC

## 2017-07-05 MED ORDER — GLUCOSE BLOOD VI STRP: ORAL_STRIP | 12 refills | Status: DC

## 2017-07-05 MED ORDER — SENNA-DOCUSATE SODIUM 8.6-50 MG PO TABS: 2.0000 | ORAL_TABLET | Freq: Every day | ORAL | 0 refills | Status: DC

## 2017-07-05 NOTE — Patient Instructions (Addendum)
It was so nice to see you!  For your allergies- I have prescribed Zyrtec which is a pill. Please take 1 tablet daily. I have also prescribed a nose spray called Flonase. Please use two sprays in each nostril every day.  For your diabetes- please STOP the Glipizide. Please take Lantus 10 units every night. Please also check your blood sugar once every morning before eating and bring these numbers to your next appointment.  We will see you back in clinic in 1 month.  -Dr. Brett Albino

## 2017-07-07 NOTE — Progress Notes (Signed)
   [facility] Phone: [phone]  Subjective:  [name] is a 72 year old female presenting to clinic with allergies and for follow-up of her diabetes.  Allergies: Patient having runny nose, congestion, and post-nasal drip for the last 1.5 months. Also with feeling like her ears are "stopped up". Has gotten worse since the weather started getting warmer. Has not taken anything for this. No fevers.  T2DM: Doesn't check CBGs at home because her meter broke. Taking Glipizide 10mg  bid. Was previously prescribed Jardiance, but only took it for a couple of days because she started having nausea. Has been tried on Trulicity in the past, but this made her itch. Has also been on Metformin, but this gave her diarrhea. Has been on Lantus in the past and is willing to try this again. No polyuria, no polydipsia.  ROS: See HPI for pertinent positives and negatives  Past Medical History- CAD, HTN, allergies, uncontrolled T2DM, depression, hx colon cancer, HLD  Family history reviewed for today's visit. No changes.  Social history- patient is a never smoker  Objective: BP 130/62   Pulse 83   Temp 98.5 F (36.9 C) (Oral)   Ht 5\' 3"  (1.6 m)   Wt 134 lb (60.8 kg)   SpO2 99%   BMI 23.74 kg/m  Gen: NAD, alert, cooperative with exam HEENT: NCAT, EOMI, MMM, TMs normal, nasal turbinates edematous, oropharynx normal Neck: FROM, supple, no cervical lymphadenopathy CV: RRR, no murmur Resp: CTABL, no wheezes, normal work of breathing Msk: No edema, warm, normal tone, moves UE/LE spontaneously Neuro: Alert and oriented, no gross deficits Skin: No rashes, no lesions Psych: Appropriate behavior  Assessment/Plan: Seasonal Allergies: Uncontrolled. Worsening since the weather has gotten warmer. - Zyrtec 10mg  daily - Flonase 2 sprays per nostril daily - Follow-up if no improvement  T2DM: Uncontrolled. A1c 11.0. Currently just taking Glipizide 10mg  bid. Has been unable to tolerate  Metformin (diarrhea), Trulicity (itching), and Jardiance (nausea) in the past.  - Start Lantus 10 units daily - Stop Glipizide - New rx sent for blood sugar meter, strips, lancing device, and lancets. - Check fasting blood sugar once in the morning - Follow-up in 1 month   Hyman Bible, MD PGY-3

## 2017-07-07 NOTE — Assessment & Plan Note (Signed)
Uncontrolled. A1c 11.0. Currently just taking Glipizide 10mg  bid. Has been unable to tolerate Metformin (diarrhea), Trulicity (itching), and Jardiance (nausea) in the past.  - Start Lantus 10 units daily - Stop Glipizide - New rx sent for blood sugar meter, strips, lancing device, and lancets. - Check fasting blood sugar once in the morning - Follow-up in 1 month

## 2017-07-07 NOTE — Assessment & Plan Note (Signed)
Uncontrolled. Worsening since the weather has gotten warmer. - Zyrtec 10mg  daily - Flonase 2 sprays per nostril daily - Follow-up if no improvement

## 2017-07-10 ENCOUNTER — Other Ambulatory Visit: Payer: Self-pay | Admitting: Internal Medicine

## 2017-07-10 ENCOUNTER — Telehealth: Payer: Self-pay

## 2017-07-10 MED ORDER — "INSULIN SYRINGE-NEEDLE U-100 30G X 1/2"" 0.3 ML MISC": 0 refills | Status: DC

## 2017-07-10 NOTE — Telephone Encounter (Signed)
Pt left message on nurse line requesting refill of Lantus Needles to [address]. Pt call back [phone] Wallace Cullens, RN

## 2017-07-10 NOTE — Telephone Encounter (Signed)
New prescription sent. Thanks

## 2017-07-14 NOTE — Telephone Encounter (Signed)
Patient calling. She needs PEN needles for her lantus not insulin syringes. Please resend. She has been out for several days. Danley Danker, RN [facility] RN)

## 2017-07-17 ENCOUNTER — Other Ambulatory Visit: Payer: Self-pay | Admitting: Internal Medicine

## 2017-07-17 MED ORDER — INSULIN PEN NEEDLE 31G X 5 MM MISC: 2 refills | Status: DC

## 2017-07-17 NOTE — Telephone Encounter (Signed)
New prescription sent for pen needles. Thanks!

## 2017-08-08 ENCOUNTER — Encounter: Payer: Self-pay | Admitting: Internal Medicine

## 2017-08-08 ENCOUNTER — Ambulatory Visit (INDEPENDENT_AMBULATORY_CARE_PROVIDER_SITE_OTHER): Payer: Medicare Other | Admitting: Internal Medicine

## 2017-08-08 ENCOUNTER — Other Ambulatory Visit: Payer: Self-pay

## 2017-08-08 DIAGNOSIS — E1165 Type 2 diabetes mellitus with hyperglycemia: Secondary | ICD-10-CM

## 2017-08-08 DIAGNOSIS — K5900 Constipation, unspecified: Secondary | ICD-10-CM | POA: Diagnosis not present

## 2017-08-08 MED ORDER — TRIAMCINOLONE 0.1 % CREAM:EUCERIN CREAM 1:1: 1.0000 "application " | TOPICAL_CREAM | Freq: Two times a day (BID) | CUTANEOUS | 0 refills | Status: DC | PRN

## 2017-08-08 MED ORDER — POLYETHYLENE GLYCOL 3350 17 GM/SCOOP PO POWD: 1.0000 | Freq: Every day | ORAL | 0 refills | Status: DC

## 2017-08-08 MED ORDER — SENNA-DOCUSATE SODIUM 8.6-50 MG PO TABS: 2.0000 | ORAL_TABLET | Freq: Two times a day (BID) | ORAL | 0 refills | Status: DC

## 2017-08-08 MED ORDER — INSULIN PEN NEEDLE 31G X 5 MM MISC
2 refills | Status: DC
Start: 2017-08-08 — End: 2021-05-25

## 2017-08-08 MED ORDER — INSULIN GLARGINE 100 UNIT/ML SOLOSTAR PEN: 10.0000 [IU] | PEN_INJECTOR | Freq: Every day | SUBCUTANEOUS | 0 refills | Status: DC

## 2017-08-08 NOTE — Patient Instructions (Signed)
It was so nice to see you today!  For your diabetes- please start taking Lantus 10 units each night. Please check your blood sugars once daily in the morning before eating.  For your constipation- I have prescribed some Sennokot-S. Please take 2 tablets twice a day (in addition to the Dulcolax and Miralax). If you do not have a BM, please try the Miralax clean out.  Miralax Clean Out Instructions: Mix 16 capfuls of Miralax into 64 oz of liquid. Drink 4-8oz every 30 minutes. It will take a few hours to finish the medicine.  Please go to the emergency department if you start having any vomiting or severe abdominal pain.  We will see you back in 4 weeks.  -Dr. Brett Albino

## 2017-08-09 NOTE — Assessment & Plan Note (Addendum)
Uncontrolled. Last A1c on 07/05/17 was 11.1. Switched from Glipizide to Lantus 10 units daily at last visit, but has not started taking this. Has been unable to tolerate Metformin, Trulicity, and Jardiance in the past due to side effects. - New prescription sent in for Lantus 10 units daily - Check fasting blood sugar each morning and write blood sugars down in log - Can consider trying Januvia in the future - Diabetic foot exam performed today - Follow-up in 1 month with blood sugar log.

## 2017-08-09 NOTE — Progress Notes (Signed)
   [facility] Phone: [phone]  Subjective:  [name] is a 72 year old female presenting to clinic to discuss constipation and diabetes.  Constipation: Has had an issue with this for most of her life, but this has gotten worse in the last 2 weeks. Taking Dulcolax bid and Miralax bid. Last BM was two days ago. States it was "hard balls" and she had to strain a lot. Passing flatus. She endorses some "crampy" LLQ abdominal pain. No vomiting. No severe abdominal pain. No fevers. No hematochezia. Would like to try Senokot-S because this has helped her in the past.  T2DM: Was switched from Glipizide to Lantus 10 units daily at her last visit on 07/05/17. She was unable to get the Lantus from the pharmacy and does not know why. She has not taken any diabetes medications in the last month. Not checking her blood sugars. She endorses polyuria and polydipsia.  ROS: See HPI for pertinent positives and negatives  Past Medical History- CAD, HTN, allergies, uncontrolled T2DM, depression, hx colon cancer, HLD  Family history reviewed for today's visit. No changes.  Social history- patient is a never smoker  Objective: BP 118/62   Pulse 61   Temp 98 F (36.7 C) (Oral)   Wt 135 lb (61.2 kg)   SpO2 99%   BMI 23.91 kg/m  Gen: NAD, alert, cooperative with exam HEENT: NCAT, EOMI, MMM Neck: FROM, supple CV: RRR, no murmur Resp: CTABL, no wheezes, normal work of breathing GI: +soft, non-distended, BS present, +very mild tenderness to deep palpation of the LLQ, no rebound or guarding Msk: No edema, warm, normal tone, moves UE/LE spontaneously Feet: No deformities, no ulcerations, no skin breakdown bilaterally; intact to touch and monofilament testing bilaterally; PT and DP pulses intact bilaterally  Assessment/Plan: Constipation: Chronic issue, but worsening over the last two weeks. SBO unlikely as patient denies vomiting, is passing flatus, and had a BM two days ago. -  Continue Miralax bid - Continue Dulcolax bid - Prescribed Senokot-S 2 tablets bid (this has worked well for patient in the past) - If no improvement in the next couple of days, advised patient to try a Miralax clean out. - Return precautions and reasons to go to the ED discussed in detail- vomiting, severe abdominal pain, fevers/chills.  T2DM: Uncontrolled. Last A1c on 07/05/17 was 11.1. Switched from Glipizide to Lantus 10 units daily at last visit, but has not started taking this. Has been unable to tolerate Metformin, Trulicity, and Jardiance in the past due to side effects. - New prescription sent in for Lantus 10 units daily - Check fasting blood sugar each morning and write blood sugars down in log - Can consider trying Januvia in the future - Diabetic foot exam performed today - Follow-up in 1 month with blood sugar log.   Hyman Bible, MD PGY-3

## 2017-08-09 NOTE — Assessment & Plan Note (Signed)
Chronic issue, but worsening over the last two weeks. SBO unlikely as patient denies vomiting, is passing flatus, and had a BM two days ago. - Continue Miralax bid - Continue Dulcolax bid - Prescribed Senokot-S 2 tablets bid (this has worked well for patient in the past) - If no improvement in the next couple of days, advised patient to try a Miralax clean out. - Return precautions and reasons to go to the ED discussed in detail- vomiting, severe abdominal pain, fevers/chills.

## 2017-08-10 ENCOUNTER — Other Ambulatory Visit: Payer: Self-pay

## 2017-08-11 ENCOUNTER — Other Ambulatory Visit: Payer: Self-pay | Admitting: Internal Medicine

## 2017-08-11 MED ORDER — TRIAMCINOLONE ACETONIDE 0.1 % EX CREA: 1.0000 "application " | TOPICAL_CREAM | Freq: Two times a day (BID) | CUTANEOUS | 0 refills | Status: DC

## 2017-08-11 MED ORDER — TRIAMCINOLONE 0.1 % CREAM:EUCERIN CREAM 1:1: 1.0000 "application " | TOPICAL_CREAM | Freq: Two times a day (BID) | CUTANEOUS | 0 refills | Status: DC | PRN

## 2017-08-19 ENCOUNTER — Other Ambulatory Visit: Payer: Self-pay

## 2017-08-19 ENCOUNTER — Encounter (HOSPITAL_COMMUNITY): Payer: Self-pay

## 2017-08-19 ENCOUNTER — Inpatient Hospital Stay (HOSPITAL_COMMUNITY)
Admission: EM | Admit: 2017-08-19 | Discharge: 2017-08-24 | DRG: 684 | Disposition: A | Discharge status: Home / Self Care | Payer: Medicare Other | Attending: Internal Medicine | Admitting: Internal Medicine

## 2017-08-19 ENCOUNTER — Emergency Department (HOSPITAL_COMMUNITY): Payer: Medicare Other

## 2017-08-19 DIAGNOSIS — Z9049 Acquired absence of other specified parts of digestive tract: Secondary | ICD-10-CM

## 2017-08-19 DIAGNOSIS — R531 Weakness: Secondary | ICD-10-CM | POA: Diagnosis not present

## 2017-08-19 DIAGNOSIS — E1165 Type 2 diabetes mellitus with hyperglycemia: Secondary | ICD-10-CM | POA: Diagnosis not present

## 2017-08-19 DIAGNOSIS — N183 Chronic kidney disease, stage 3 (moderate): Secondary | ICD-10-CM | POA: Diagnosis present

## 2017-08-19 DIAGNOSIS — R079 Chest pain, unspecified: Secondary | ICD-10-CM | POA: Diagnosis not present

## 2017-08-19 DIAGNOSIS — I2510 Atherosclerotic heart disease of native coronary artery without angina pectoris: Secondary | ICD-10-CM | POA: Diagnosis not present

## 2017-08-19 DIAGNOSIS — R739 Hyperglycemia, unspecified: Secondary | ICD-10-CM

## 2017-08-19 DIAGNOSIS — I340 Nonrheumatic mitral (valve) insufficiency: Secondary | ICD-10-CM | POA: Diagnosis not present

## 2017-08-19 DIAGNOSIS — E875 Hyperkalemia: Secondary | ICD-10-CM | POA: Diagnosis not present

## 2017-08-19 DIAGNOSIS — I700 Atherosclerosis of aorta: Secondary | ICD-10-CM | POA: Diagnosis not present

## 2017-08-19 DIAGNOSIS — R748 Abnormal levels of other serum enzymes: Secondary | ICD-10-CM | POA: Diagnosis not present

## 2017-08-19 DIAGNOSIS — E785 Hyperlipidemia, unspecified: Secondary | ICD-10-CM | POA: Diagnosis present

## 2017-08-19 DIAGNOSIS — D631 Anemia in chronic kidney disease: Secondary | ICD-10-CM | POA: Diagnosis present

## 2017-08-19 DIAGNOSIS — E1122 Type 2 diabetes mellitus with diabetic chronic kidney disease: Secondary | ICD-10-CM | POA: Diagnosis present

## 2017-08-19 DIAGNOSIS — R110 Nausea: Secondary | ICD-10-CM | POA: Diagnosis not present

## 2017-08-19 DIAGNOSIS — K219 Gastro-esophageal reflux disease without esophagitis: Secondary | ICD-10-CM | POA: Diagnosis not present

## 2017-08-19 DIAGNOSIS — R4702 Dysphasia: Secondary | ICD-10-CM | POA: Diagnosis not present

## 2017-08-19 DIAGNOSIS — M5442 Lumbago with sciatica, left side: Secondary | ICD-10-CM | POA: Diagnosis present

## 2017-08-19 DIAGNOSIS — Z888 Allergy status to other drugs, medicaments and biological substances status: Secondary | ICD-10-CM

## 2017-08-19 DIAGNOSIS — Z85038 Personal history of other malignant neoplasm of large intestine: Secondary | ICD-10-CM

## 2017-08-19 DIAGNOSIS — R1030 Lower abdominal pain, unspecified: Secondary | ICD-10-CM | POA: Diagnosis not present

## 2017-08-19 DIAGNOSIS — I129 Hypertensive chronic kidney disease with stage 1 through stage 4 chronic kidney disease, or unspecified chronic kidney disease: Secondary | ICD-10-CM | POA: Diagnosis present

## 2017-08-19 DIAGNOSIS — R1310 Dysphagia, unspecified: Secondary | ICD-10-CM | POA: Diagnosis not present

## 2017-08-19 DIAGNOSIS — E876 Hypokalemia: Secondary | ICD-10-CM | POA: Diagnosis present

## 2017-08-19 DIAGNOSIS — K222 Esophageal obstruction: Secondary | ICD-10-CM | POA: Diagnosis not present

## 2017-08-19 DIAGNOSIS — K5900 Constipation, unspecified: Secondary | ICD-10-CM | POA: Diagnosis present

## 2017-08-19 DIAGNOSIS — R1013 Epigastric pain: Secondary | ICD-10-CM | POA: Diagnosis not present

## 2017-08-19 DIAGNOSIS — M5432 Sciatica, left side: Secondary | ICD-10-CM | POA: Diagnosis not present

## 2017-08-19 DIAGNOSIS — Z79899 Other long term (current) drug therapy: Secondary | ICD-10-CM

## 2017-08-19 DIAGNOSIS — N179 Acute kidney failure, unspecified: Secondary | ICD-10-CM | POA: Diagnosis not present

## 2017-08-19 DIAGNOSIS — I361 Nonrheumatic tricuspid (valve) insufficiency: Secondary | ICD-10-CM | POA: Diagnosis not present

## 2017-08-19 DIAGNOSIS — Z833 Family history of diabetes mellitus: Secondary | ICD-10-CM

## 2017-08-19 DIAGNOSIS — R7989 Other specified abnormal findings of blood chemistry: Secondary | ICD-10-CM

## 2017-08-19 DIAGNOSIS — D649 Anemia, unspecified: Secondary | ICD-10-CM | POA: Diagnosis not present

## 2017-08-19 DIAGNOSIS — R0789 Other chest pain: Secondary | ICD-10-CM | POA: Diagnosis not present

## 2017-08-19 LAB — CBC
HCT: 31.6 % — ABNORMAL LOW (ref 36.0–46.0)
Hemoglobin: 10.3 g/dL — ABNORMAL LOW (ref 12.0–15.0)
MCH: 27.9 pg (ref 26.0–34.0)
MCHC: 32.6 g/dL (ref 30.0–36.0)
MCV: 85.6 fL (ref 78.0–100.0)
Platelets: 238 10*3/uL (ref 150–400)
RBC: 3.69 MIL/uL — ABNORMAL LOW (ref 3.87–5.11)
RDW: 13 % (ref 11.5–15.5)
WBC: 8.6 10*3/uL (ref 4.0–10.5)

## 2017-08-19 LAB — URINALYSIS, ROUTINE W REFLEX MICROSCOPIC
Bilirubin Urine: NEGATIVE
Glucose, UA: >=500 mg/dL — AB
Ketones, ur: NEGATIVE mg/dL
Leukocytes, UA: NEGATIVE
Nitrite: NEGATIVE
Protein, ur: NEGATIVE mg/dL
Specific Gravity, Urine: 1.022 (ref 1.005–1.030)
pH: 5 (ref 5.0–8.0)

## 2017-08-19 LAB — I-STAT TROPONIN, ED
Troponin i, poc: 0.02 ng/mL (ref 0.00–0.08)
Troponin i, poc: 0.01 ng/mL (ref 0.00–0.08)

## 2017-08-19 LAB — BASIC METABOLIC PANEL
Anion gap: 14 (ref 5–15)
BUN: 41 mg/dL — ABNORMAL HIGH (ref 6–20)
CO2: 24 mmol/L (ref 22–32)
Calcium: 9.6 mg/dL (ref 8.9–10.3)
Chloride: 97 mmol/L — ABNORMAL LOW (ref 101–111)
Creatinine, Ser: 1.82 mg/dL — ABNORMAL HIGH (ref 0.44–1.00)
GFR calc Af Amer: 31 mL/min — ABNORMAL LOW (ref >60)
GFR calc non Af Amer: 27 mL/min — ABNORMAL LOW (ref >60)
Glucose, Bld: 533 mg/dL (ref 65–99)
Potassium: 3.2 mmol/L — ABNORMAL LOW (ref 3.5–5.1)
Sodium: 135 mmol/L (ref 135–145)

## 2017-08-19 MED ORDER — IOPAMIDOL (ISOVUE-300) INJECTION 61%
30.0000 mL | Freq: Once | INTRAVENOUS | Status: AC | PRN
  Administered 2017-08-19: 30 mL via ORAL

## 2017-08-19 MED ORDER — SODIUM CHLORIDE 0.9 % IV BOLUS
1000.0000 mL | Freq: Once | INTRAVENOUS | Status: AC
  Administered 2017-08-19: 1000 mL via INTRAVENOUS

## 2017-08-19 MED ORDER — INSULIN ASPART 100 UNIT/ML ~~LOC~~ SOLN
5.0000 [IU] | Freq: Once | SUBCUTANEOUS | Status: AC
  Administered 2017-08-19: 5 [IU] via SUBCUTANEOUS
  Filled 2017-08-19: qty 1

## 2017-08-19 NOTE — ED Notes (Signed)
Culture sent with UA 

## 2017-08-19 NOTE — ED Provider Notes (Signed)
[facility] DEPT Provider Note   CSN: [id number] Arrival date & time: 08/19/17  1832     History   Chief Complaint Chief Complaint  Patient presents with  . Chest Pain  . Weakness    HPI [name] is a 72 y.o. female presenting for evaluation of left leg pain, generalized weakness, and chest pain.  Patient states that 3 days ago, she started to have left leg pain.  It starts at her back and radiates down her entire leg.  This is progressively gotten worse over the past several days.  It is worse with movement.  She has not tried anything to make it better.  Yesterday, she developed central chest pain.  This is now constant and nonradiating.  It is sharp.  Nothing makes it better or worse.  She denies history of similar.  She also states she has been feeling weak in the past several days, and her arms started to shake.  No change in medications recently.  States her blood sugars have been in the 100s, baseline for her.  She has a several month history of constipation, last bowel movement was 4 days ago.  She is taking MiraLAX, but this is not improving her symptoms.  She reports generalized abdominal pain, worse in the lower quadrants.  She denies fevers, chills, cough, shortness of breath, nausea, vomiting, or urinary symptoms.  She denies fall, trauma, or injury.  No numbness or tingling.  He denies cardiac history.  She has a history of diabetes, hypertension, hyperlipidemia, colon CA.  She denies alcohol, drug, or tobacco use.  HPI  Past Medical History:  Diagnosis Date  . Allergy   . Asthma   . Blood transfusion without reported diagnosis 12/23/2004   with colon cancer  . Brain tumor (benign) ([place])   . Colon cancer [facility]) Nov. 27,  2006  . Diabetes mellitus    Type 2  . Female bladder prolapse   . Gastritis   . GERD (gastroesophageal reflux disease)   . Glaucoma   . Hypercholesterolemia   . Hypertension     Patient Active Problem List   Diagnosis Date Noted  . ARF (acute renal failure) ([place]) 08/20/2017  . Constipation 03/14/2016  . Health care maintenance 03/14/2016  . Memory difficulties 07/20/2015  . Dysphagia 11/17/2014  . Bradycardia 12/08/2013  . Urine incontinence 03/07/2012  . Hematuria - cause not known 01/09/2012  . VERTIGO, CHRONIC 06/16/2009  . DM (diabetes mellitus), type 2, uncontrolled ([place]) 03/16/2007  . MENINGIOMA 02/07/2007  . HYPERTRIGLYCERIDEMIA 07/14/2006  . HYPERTENSION, BENIGN ESSENTIAL 07/14/2006  . Allergic rhinitis 07/14/2006  . History of colon cancer 06/08/2006  . ANEMIA, IRON DEFICIENCY, UNSPEC. 06/08/2006  . DEPRESSIVE DISORDER, NOS 06/08/2006  . CAD (coronary artery disease) 06/08/2006  . GASTROESOPHAGEAL REFLUX, NO ESOPHAGITIS 06/08/2006  . FIBROADENOSIS, BREAST 06/08/2006  . EXTERNAL HEMORRHOIDS 10/23/2001  . COLONIC POLYPS, ADENOMATOUS, BENIGN 10/23/2001    Past Surgical History:  Procedure Laterality Date  . ABDOMINAL HYSTERECTOMY  1976  . bladder tac    . CARDIAC CATHETERIZATION  2005  . CATARACT EXTRACTION, BILATERAL    . COLONOSCOPY W/ BIOPSIES    . ESOPHAGEAL MANOMETRY N/A 03/20/2015   Procedure: ESOPHAGEAL MANOMETRY (EM);  Surgeon: Gatha Mayer, MD;  Location: [facility] ENDOSCOPY;  Service: Endoscopy;  Laterality: N/A;  . EYE SURGERY     mole removed left eye  . LAPAROSCOPIC NISSEN FUNDOPLICATION  5427  . repair prolapsedbladder    . RIGHT COLECTOMY  Nov. 27, 2006  . TMJ ARTHROPLASTY    . UPPER GASTROINTESTINAL ENDOSCOPY       OB History   None      Home Medications    Prior to Admission medications   Medication Sig Start Date End Date Taking? Authorizing Provider  amLODipine (NORVASC) 2.5 MG tablet take 1 tablet by mouth once daily 05/22/17  Yes Mayo, Pete Pelt, MD  aspirin 81 MG chewable tablet Chew 81 mg by mouth daily.     Yes [provider]  Blood Glucose Monitoring Suppl (ONETOUCH VERIO) w/Device KIT 1 each by Does not apply route daily. 07/05/17   Yes Mayo, Pete Pelt, MD  cetirizine (ZYRTEC) 10 MG tablet Take 1 tablet (10 mg total) by mouth daily. Patient taking differently: Take 10 mg by mouth daily as needed for allergies.  07/05/17  Yes Mayo, Pete Pelt, MD  esomeprazole (NEXIUM) 40 MG packet Take 40 mg by mouth daily before breakfast. Patient taking differently: Take 40 mg by mouth daily as needed (reflux).  07/05/17  Yes Mayo, Pete Pelt, MD  fluticasone [facility]) 50 MCG/ACT nasal spray Place 2 sprays into both nostrils daily. Patient taking differently: Place 2 sprays into both nostrils daily as needed for allergies.  07/05/17  Yes Mayo, Pete Pelt, MD  glucose blood [facility] VERIO) test strip Use as instructed 07/05/17  Yes Mayo, Pete Pelt, MD  ibuprofen (ADVIL,MOTRIN) 200 MG tablet Take 2 tablets (400 mg total) by mouth every 6 (six) hours as needed (pain). 10/07/15  Yes Mesner, Corene Cornea, MD  Insulin Glargine (LANTUS SOLOSTAR) 100 UNIT/ML Solostar Pen Inject 10 Units into the skin daily at 10 pm. 08/08/17  Yes Mayo, Pete Pelt, MD  Insulin Pen Needle 31G X 5 MM MISC Check blood sugar once daily 08/08/17  Yes Mayo, Pete Pelt, MD  Lancet Devices (ONE TOUCH DELICA LANCING DEV) MISC Check blood sugar once daily 07/05/17  Yes Mayo, Pete Pelt, MD  nitroGLYCERIN (NITROSTAT) 0.4 MG SL tablet Place 1 tablet (0.4 mg total) under the tongue every 5 (five) minutes as needed. If chest pain not relieved by third tab call 911. 11/24/14  Yes Olam Idler, MD  [facility] LANCETS 71I MISC Check blood sugar once daily 07/05/17  Yes Mayo, Pete Pelt, MD  polyethylene glycol powder (GLYCOLAX/MIRALAX) powder Take 255 g by mouth daily. Patient taking differently: Take 1 Container by mouth daily as needed for mild constipation or moderate constipation.  08/08/17  Yes Mayo, Pete Pelt, MD  quinapril-hydrochlorothiazide (ACCURETIC) 20-12.5 MG tablet take 2 tablets by mouth once daily 10/05/16  Yes Mayo, Pete Pelt, MD  rosuvastatin (CRESTOR) 20 MG tablet Take 1 tablet (20  mg total) daily by mouth. 02/27/17  Yes Mayo, Pete Pelt, MD  triamcinolone cream (KENALOG) 0.1 % Apply 1 application topically 2 (two) times daily. Patient taking differently: Apply 1 application topically 2 (two) times daily as needed (rash).  08/11/17  Yes Mayo, Pete Pelt, MD  baclofen (LIORESAL) 10 MG tablet Take 1 tablet (10 mg total) by mouth 3 (three) times daily. Patient not taking: Reported on 08/19/2017 01/03/17   Mayo, Pete Pelt, MD  clotrimazole (LOTRIMIN) 1 % cream Apply 1 application topically 2 (two) times daily. Patient not taking: Reported on 08/19/2017 01/03/17   Mayo, Pete Pelt, MD  sennosides-docusate sodium (SENOKOT-S) 8.6-50 MG tablet Take 2 tablets by mouth 2 (two) times daily. Patient not taking: Reported on 08/19/2017 08/08/17   Mayo, Pete Pelt, MD  Triamcinolone Acetonide (TRIAMCINOLONE 0.1 % CREAM :  EUCERIN) CREA Apply 1 application topically 2 (two) times daily as needed for itching. Patient not taking: Reported on 08/19/2017 08/11/17   Mayo, Pete Pelt, MD    Family History Family History  Problem Relation Age of Onset  . Lymphoma Other   . Bone cancer Father   . Diabetes Daughter   . Hypertension Daughter   . Heart attack Daughter   . Suicidality Son        deceased  . Colon cancer Neg Hx   . Stomach cancer Neg Hx     Social History Social History   Tobacco Use  . Smoking status: Never Smoker  . Smokeless tobacco: Never Used  Substance Use Topics  . Alcohol use: No  . Drug use: No     Allergies   Metformin and related   Review of Systems Review of Systems  Cardiovascular: Positive for chest pain.  Musculoskeletal: Positive for back pain and myalgias.  Neurological: Positive for weakness.  All other systems reviewed and are negative.    Physical Exam Updated Vital Signs BP (!) 134/98   Pulse 75   Temp 99 F (37.2 C) (Rectal)   Resp 17   Ht _0  (1.626 m)   Wt 61.2 kg (135 lb)   SpO2 100%   BMI 23.17 kg/m   Physical Exam    Constitutional: She is oriented to person, place, and time. She appears well-developed and well-nourished. No distress.  Patient in no apparent distress  HENT:  Head: Normocephalic and atraumatic.  MM moist.  Eyes: Pupils are equal, round, and reactive to light. Conjunctivae and EOM are normal.  Neck: Normal range of motion. Neck supple.  Cardiovascular: Normal rate, regular rhythm and intact distal pulses.  Pulmonary/Chest: Effort normal and breath sounds normal. No respiratory distress. She has no wheezes. She exhibits tenderness.  Speaking full sentences.  Clear lung sounds in all fields.  Mild tenderness palpation of central chest.  Abdominal: Soft. Bowel sounds are normal. She exhibits no distension and no mass. There is tenderness. There is no rebound and no guarding.  Generalized tenderness palpation of abdomen, worse in bilateral lower quadrants.  No rigidity, guarding, or distention.  Negative rebound.  Negative Murphy's.  Musculoskeletal: Normal range of motion. She exhibits tenderness. She exhibits no edema.  TTP of low left back musculature. Strength intact x4.  Sensation intact x4.  Radial and pedal pulses equal bilaterally. No obvious tremors or shaking.  Neurological: She is alert and oriented to person, place, and time. No sensory deficit.  Skin: Skin is warm and dry.  Psychiatric: She has a normal mood and affect.  Nursing note and vitals reviewed.    ED Treatments / Results  Labs (all labs ordered are listed, but only abnormal results are displayed) Labs Reviewed  BASIC METABOLIC PANEL - Abnormal; Notable for the following components:      Result Value   Potassium 3.2 (*)    Chloride 97 (*)    Glucose, Bld 533 (*)    BUN 41 (*)    Creatinine, Ser 1.82 (*)    GFR calc non Af Amer 27 (*)    GFR calc Af Amer 31 (*)    All other components within normal limits  CBC - Abnormal; Notable for the following components:   RBC 3.69 (*)    Hemoglobin 10.3 (*)    HCT 31.6  (*)    All other components within normal limits  URINALYSIS, ROUTINE W REFLEX MICROSCOPIC - Abnormal; Notable  for the following components:   Color, Urine STRAW (*)    Glucose, UA >=500 (*)    Hgb urine dipstick SMALL (*)    Bacteria, UA RARE (*)    All other components within normal limits  CBG MONITORING, ED - Abnormal; Notable for the following components:   Glucose-Capillary 326 (*)    All other components within normal limits  CK  I-STAT TROPONIN, ED  I-STAT TROPONIN, ED    EKG EKG Interpretation  Date/Time:  Saturday Aug 19 2017 19:00:35 EDT Ventricular Rate:  93 PR Interval:    QRS Duration: 78 QT Interval:  347 QTC Calculation: 432 R Axis:   9 Text Interpretation:  Sinus rhythm Abnormal R-wave progression, early transition Borderline T wave abnormalities Minimal ST elevation, anterior leads Baseline wander in lead(s) V3 When compared with ECG of 01/02/2017, HEART RATE has increased Confirmed by Delora Fuel ([id number]) on 08/19/2017 11:06:26 PM   Radiology Ct Abdomen Pelvis Wo Contrast  Result Date: 08/20/2017 CLINICAL DATA:  72 year old female with lower abdominal pain. EXAM: CT ABDOMEN AND PELVIS WITHOUT CONTRAST TECHNIQUE: Multidetector CT imaging of the abdomen and pelvis was performed following the standard protocol without IV contrast. COMPARISON:  CT of abdomen pelvis dated 12/02/2013 FINDINGS: Evaluation of this exam is limited in the absence of intravenous contrast. Lower chest: Minimal bibasilar atelectatic changes. The visualized lung bases are otherwise clear. No intra-abdominal free air or free fluid. Hepatobiliary: Choose 1 Pancreas: Unremarkable. No pancreatic ductal dilatation or surrounding inflammatory changes. Spleen: Normal in size without focal abnormality. Adrenals/Urinary Tract: Adrenal glands are unremarkable. Kidneys are normal, without renal calculi, focal lesion, or hydronephrosis. Bladder is unremarkable. Stomach/Bowel: Postprocedural changes of Nissen  fundoplication. There is thickening of the proximal stomach which may be related to underdistention or represent gastritis. Clinical correlation is recommended. No bowel obstruction. Postsurgical changes of partial right hemicolectomy. Appendectomy. Vascular/Lymphatic: There is moderate aortoiliac atherosclerotic disease. No portal venous gas. There is no adenopathy. Reproductive: Hysterectomy.  No pelvic mass. Other: None Musculoskeletal: No acute or significant osseous findings. Stimulator device in the left lower back with wire extending into the right sacral region. IMPRESSION: 1. Underdistention the stomach versus gastritis. Clinical correlation is recommended. No bowel obstruction. 2.  Aortic Atherosclerosis (ICD10-I70.0). Electronically Signed   By: Anner Crete M.D.   On: 08/20/2017 03:12   Dg Chest 2 View  Result Date: 08/19/2017 CLINICAL DATA:  Left-sided chest pain for several hours, initial encounter EXAM: CHEST - 2 VIEW COMPARISON:  12/06/2013 FINDINGS: Cardiac shadow is within normal limits. The lungs are well aerated bilaterally. Tortuous vascularity contributes to a prominent superior mediastinum but stable in appearance from the prior study. No focal infiltrate or sizable effusion is seen. No acute bony abnormality is noted. Postoperative change in the upper abdomen is seen. IMPRESSION: No acute abnormality noted. Electronically Signed   By: Inez Catalina M.D.   On: 08/19/2017 19:56    Procedures Procedures (including critical care time)  Medications Ordered in ED Medications  sodium chloride 0.9 % bolus 1,000 mL (1,000 mLs Intravenous New Bag/Given 08/19/17 2357)  insulin aspart (novoLOG) injection 5 Units (5 Units Subcutaneous Given 08/19/17 2327)  iopamidol (ISOVUE-300) 61 % injection 30 mL (30 mLs Oral Contrast Given 08/19/17 2350)     Initial Impression / Assessment and Plan / ED Course  I have reviewed the triage vital signs and the nursing notes.  Pertinent labs & imaging  results that were available during my care of the patient were reviewed by  me and considered in my medical decision making (see chart for details).     Patient presenting for evaluation of weakness, low back pain, and chest pain.  Physical exam shows elderly female no apparent distress.  She is afebrile not tachycardic.  Pulmonary exam reassuring.  Abdominal exam with mild tenderness.  No obvious tremors or weakness.  Labs concerning, as patient blood sugar over 500.  Kidney function slightly worse than previous, ans showing concerning trend of worsening SCr over past year.  Urine with glucose, but no infection.  Initial troponin negative.  Chest x-ray reviewed and interpreted by me, no pneumonia, pneumothorax, or effusions.  EKG without STEMI.  However with patient's age and risk factors, heart score 5.  Will give IV fluids, dose of insulin, and repeat troponin.  Will obtain CT abdomen due to patient's several month history of constipation that has not been evaluated with imaging and hyperglycemia, to rule out infection and due to history of colon cancer.   CT abdomen shows possible gastritis, but no sign of surgical abdomen, perforation, obstruction.  Case discussed with attending, Dr. Roxanne Mins evaluated the patient.  Will consult with hospitalist for admission.  Discussed with Dr. Maudie Mercury, patient to be admitted to [facility] service.   Final Clinical Impressions(s) / ED Diagnoses   Final diagnoses:  Weakness  Creatinine elevation  Hyperglycemia  Chest pain, unspecified type    ED Discharge Orders    None       Franchot Heidelberg, PA-C 52/17/47 [id number]    Delora Fuel, MD 39/67/28 [phone]

## 2017-08-19 NOTE — ED Triage Notes (Signed)
Pt states she has been having sternal chest pain all day today. Pt states that she has been very weak today and almost fell d/t her legs "giving out" on her. Pt A&Ox4.

## 2017-08-20 ENCOUNTER — Emergency Department (HOSPITAL_COMMUNITY): Payer: Medicare Other

## 2017-08-20 ENCOUNTER — Inpatient Hospital Stay (HOSPITAL_COMMUNITY): Payer: Medicare Other

## 2017-08-20 ENCOUNTER — Encounter (HOSPITAL_COMMUNITY): Payer: Self-pay | Admitting: Radiology

## 2017-08-20 ENCOUNTER — Other Ambulatory Visit: Payer: Self-pay

## 2017-08-20 DIAGNOSIS — R1013 Epigastric pain: Secondary | ICD-10-CM | POA: Diagnosis not present

## 2017-08-20 DIAGNOSIS — D631 Anemia in chronic kidney disease: Secondary | ICD-10-CM | POA: Diagnosis present

## 2017-08-20 DIAGNOSIS — R4702 Dysphasia: Secondary | ICD-10-CM | POA: Diagnosis present

## 2017-08-20 DIAGNOSIS — N179 Acute kidney failure, unspecified: Secondary | ICD-10-CM | POA: Diagnosis present

## 2017-08-20 DIAGNOSIS — Z833 Family history of diabetes mellitus: Secondary | ICD-10-CM | POA: Diagnosis not present

## 2017-08-20 DIAGNOSIS — Z85038 Personal history of other malignant neoplasm of large intestine: Secondary | ICD-10-CM | POA: Diagnosis not present

## 2017-08-20 DIAGNOSIS — Z9049 Acquired absence of other specified parts of digestive tract: Secondary | ICD-10-CM | POA: Diagnosis not present

## 2017-08-20 DIAGNOSIS — I361 Nonrheumatic tricuspid (valve) insufficiency: Secondary | ICD-10-CM

## 2017-08-20 DIAGNOSIS — E1122 Type 2 diabetes mellitus with diabetic chronic kidney disease: Secondary | ICD-10-CM | POA: Diagnosis present

## 2017-08-20 DIAGNOSIS — R079 Chest pain, unspecified: Secondary | ICD-10-CM | POA: Diagnosis not present

## 2017-08-20 DIAGNOSIS — R1310 Dysphagia, unspecified: Secondary | ICD-10-CM | POA: Diagnosis not present

## 2017-08-20 DIAGNOSIS — M5442 Lumbago with sciatica, left side: Secondary | ICD-10-CM | POA: Diagnosis present

## 2017-08-20 DIAGNOSIS — I340 Nonrheumatic mitral (valve) insufficiency: Secondary | ICD-10-CM | POA: Diagnosis not present

## 2017-08-20 DIAGNOSIS — I2510 Atherosclerotic heart disease of native coronary artery without angina pectoris: Secondary | ICD-10-CM | POA: Diagnosis not present

## 2017-08-20 DIAGNOSIS — K219 Gastro-esophageal reflux disease without esophagitis: Secondary | ICD-10-CM | POA: Diagnosis not present

## 2017-08-20 DIAGNOSIS — E876 Hypokalemia: Secondary | ICD-10-CM | POA: Diagnosis present

## 2017-08-20 DIAGNOSIS — Z888 Allergy status to other drugs, medicaments and biological substances status: Secondary | ICD-10-CM | POA: Diagnosis not present

## 2017-08-20 DIAGNOSIS — D649 Anemia, unspecified: Secondary | ICD-10-CM | POA: Diagnosis not present

## 2017-08-20 DIAGNOSIS — M5432 Sciatica, left side: Secondary | ICD-10-CM | POA: Diagnosis not present

## 2017-08-20 DIAGNOSIS — K5900 Constipation, unspecified: Secondary | ICD-10-CM | POA: Diagnosis not present

## 2017-08-20 DIAGNOSIS — N183 Chronic kidney disease, stage 3 (moderate): Secondary | ICD-10-CM | POA: Diagnosis not present

## 2017-08-20 DIAGNOSIS — E785 Hyperlipidemia, unspecified: Secondary | ICD-10-CM | POA: Diagnosis not present

## 2017-08-20 DIAGNOSIS — R1030 Lower abdominal pain, unspecified: Secondary | ICD-10-CM | POA: Diagnosis not present

## 2017-08-20 DIAGNOSIS — K222 Esophageal obstruction: Secondary | ICD-10-CM | POA: Diagnosis not present

## 2017-08-20 DIAGNOSIS — E1165 Type 2 diabetes mellitus with hyperglycemia: Secondary | ICD-10-CM

## 2017-08-20 DIAGNOSIS — I129 Hypertensive chronic kidney disease with stage 1 through stage 4 chronic kidney disease, or unspecified chronic kidney disease: Secondary | ICD-10-CM | POA: Diagnosis not present

## 2017-08-20 DIAGNOSIS — E875 Hyperkalemia: Secondary | ICD-10-CM | POA: Diagnosis not present

## 2017-08-20 DIAGNOSIS — Z79899 Other long term (current) drug therapy: Secondary | ICD-10-CM | POA: Diagnosis not present

## 2017-08-20 DIAGNOSIS — I700 Atherosclerosis of aorta: Secondary | ICD-10-CM | POA: Diagnosis present

## 2017-08-20 DIAGNOSIS — K228 Other specified diseases of esophagus: Secondary | ICD-10-CM | POA: Diagnosis not present

## 2017-08-20 DIAGNOSIS — M5416 Radiculopathy, lumbar region: Secondary | ICD-10-CM | POA: Diagnosis not present

## 2017-08-20 DIAGNOSIS — R110 Nausea: Secondary | ICD-10-CM | POA: Diagnosis not present

## 2017-08-20 DIAGNOSIS — D509 Iron deficiency anemia, unspecified: Secondary | ICD-10-CM | POA: Diagnosis not present

## 2017-08-20 DIAGNOSIS — R531 Weakness: Secondary | ICD-10-CM | POA: Diagnosis not present

## 2017-08-20 LAB — BASIC METABOLIC PANEL
Anion gap: 14 (ref 5–15)
BUN: 35 mg/dL — ABNORMAL HIGH (ref 6–20)
CO2: 22 mmol/L (ref 22–32)
Calcium: 8.9 mg/dL (ref 8.9–10.3)
Chloride: 100 mmol/L — ABNORMAL LOW (ref 101–111)
Creatinine, Ser: 1.38 mg/dL — ABNORMAL HIGH (ref 0.44–1.00)
GFR calc Af Amer: 44 mL/min — ABNORMAL LOW (ref >60)
GFR calc non Af Amer: 38 mL/min — ABNORMAL LOW (ref >60)
Glucose, Bld: 226 mg/dL — ABNORMAL HIGH (ref 65–99)
Potassium: 3.1 mmol/L — ABNORMAL LOW (ref 3.5–5.1)
Sodium: 136 mmol/L (ref 135–145)

## 2017-08-20 LAB — CBC
HCT: 29.1 % — ABNORMAL LOW (ref 36.0–46.0)
Hemoglobin: 9.6 g/dL — ABNORMAL LOW (ref 12.0–15.0)
MCH: 28 pg (ref 26.0–34.0)
MCHC: 33 g/dL (ref 30.0–36.0)
MCV: 84.8 fL (ref 78.0–100.0)
Platelets: 186 10*3/uL (ref 150–400)
RBC: 3.43 MIL/uL — ABNORMAL LOW (ref 3.87–5.11)
RDW: 12.8 % (ref 11.5–15.5)
WBC: 9.2 10*3/uL (ref 4.0–10.5)

## 2017-08-20 LAB — CREATININE, SERUM
Creatinine, Ser: 1.33 mg/dL — ABNORMAL HIGH (ref 0.44–1.00)
GFR calc Af Amer: 46 mL/min — ABNORMAL LOW (ref >60)
GFR calc non Af Amer: 39 mL/min — ABNORMAL LOW (ref >60)

## 2017-08-20 LAB — ECHOCARDIOGRAM COMPLETE
Height: 64 in
Weight: 2160 oz

## 2017-08-20 LAB — TROPONIN I
Troponin I: <0.03 ng/mL (ref <0.03)
Troponin I: <0.03 ng/mL (ref <0.03)
Troponin I: <0.03 ng/mL (ref <0.03)

## 2017-08-20 LAB — CBG MONITORING, ED: Glucose-Capillary: 326 mg/dL — ABNORMAL HIGH (ref 65–99)

## 2017-08-20 LAB — CK: Total CK: 125 U/L (ref 38–234)

## 2017-08-20 MED ORDER — ACETAMINOPHEN 325 MG PO TABS
650.0000 mg | ORAL_TABLET | Freq: Four times a day (QID) | ORAL | Status: DC | PRN
  Administered 2017-08-23 – 2017-08-24 (×2): 650 mg via ORAL
  Filled 2017-08-20: qty 2

## 2017-08-20 MED ORDER — POTASSIUM CHLORIDE CRYS ER 10 MEQ PO TBCR
40.0000 meq | EXTENDED_RELEASE_TABLET | Freq: Two times a day (BID) | ORAL | Status: AC
  Administered 2017-08-20 (×2): 40 meq via ORAL
  Filled 2017-08-20 (×2): qty 4

## 2017-08-20 MED ORDER — POTASSIUM CHLORIDE IN NACL 20-0.9 MEQ/L-% IV SOLN
INTRAVENOUS | Status: DC
  Administered 2017-08-20 (×2): via INTRAVENOUS
  Filled 2017-08-20 (×2): qty 1000

## 2017-08-20 MED ORDER — ACETAMINOPHEN 650 MG RE SUPP: 650.0000 mg | Freq: Four times a day (QID) | RECTAL | Status: DC | PRN

## 2017-08-20 MED ORDER — TRAMADOL HCL 50 MG PO TABS
100.0000 mg | ORAL_TABLET | Freq: Four times a day (QID) | ORAL | Status: DC | PRN
  Administered 2017-08-20: 100 mg via ORAL
  Filled 2017-08-20: qty 2

## 2017-08-20 MED ORDER — HEPARIN SODIUM (PORCINE) 5000 UNIT/ML IJ SOLN
5000.0000 [IU] | Freq: Three times a day (TID) | INTRAMUSCULAR | Status: DC
  Administered 2017-08-20 – 2017-08-24 (×11): 5000 [IU] via SUBCUTANEOUS
  Filled 2017-08-20 (×12): qty 1

## 2017-08-20 NOTE — ED Notes (Signed)
Attempted report. No answer on 5E

## 2017-08-20 NOTE — H&P (Signed)
TRH H&P   Patient Demographics:    [name], is a 72 y.o. female  MRN: [id number]   DOB - 1945-04-27  Admit Date - 08/19/2017  Outpatient Primary MD for the patient is Mayo, Pete Pelt, MD  Referring MD/NP/PA: Jillyn Ledger PA  Outpatient Specialists:     Patient coming from: home  Chief Complaint  Patient presents with  . Chest Pain  . Weakness      HPI:    [name]  is a 72 y.o. female, w h/o colon cancer, s/p R colectomy (08/24/2010), Dm2, hypertension, hyperlipidemia, CAD s/p cardiac cath (05/09/2001) => 20% LM, 50% mid LAD, 20%, AV circumflex, 20-30% mid RCA who presents with c/o back pain with radiation down bilateral legs.  Denies numbness, tingling, weakness, incontinence.  Pt states worse over the past few days.  Review of prior CT scan lumbar spine showed disk herniation    Pt also notes sharp chest pain (under the left breast) lasting a few minutes, , worse with movement , reproducible with palpation.  Starting yesterday. Prior ETT 2015 negative.   Pt denies fever, chills, cough, palp, sob, n/v, heartburn, diarrhea, brbpr, black stool.   In ED,   CXR IMPRESSION: No acute abnormality noted.  CT abd/ pelvis IMPRESSION: 1. Underdistention the stomach versus gastritis. Clinical correlation is recommended. No bowel obstruction. 2.  Aortic Atherosclerosis (ICD10-I70.0).   EKG nsr at 95, nl axis, early R progression, no st-t changes c/w ischemia   Na 135, K 3.2,  Glucose 533 Bun 41, creatinine 1.82 Wbc 8.6, Hgb 10.3, Plt 238 Trop 0.01 Urinalysis negative  Pt will be admitted for chest pain, hyperglycemia, Acute on CRF, and hypokalemia.    Review of systems:    In addition to the HPI above,  No Fever-chills, No Headache, No changes with Vision or hearing, No problems swallowing food or Liquids, No Cough or Shortness of Breath, No Abdominal pain, No  Nausea or Vommitting, Bowel movements are regular, No Blood in stool or Urine, No dysuria, No new skin rashes or bruises, No new joints pains-aches,  No new weakness, tingling, numbness in any extremity, No recent weight gain or loss, No polyuria, polydypsia or polyphagia, No significant Mental Stressors.  A full 10 point Review of Systems was done, except as stated above, all other Review of Systems were negative.   With Past History of the following :    Past Medical History:  Diagnosis Date  . Allergy   . Asthma   . Blood transfusion without reported diagnosis 12/23/2004   with colon cancer  . Brain tumor (benign) ([place])   . Colon cancer [facility]) Nov. 27,  2006  . Diabetes mellitus    Type 2  . Female bladder prolapse   . Gastritis   . GERD (gastroesophageal reflux disease)   . Glaucoma   . Hypercholesterolemia   . Hypertension  Past Surgical History:  Procedure Laterality Date  . ABDOMINAL HYSTERECTOMY  1976  . bladder tac    . CARDIAC CATHETERIZATION  2005  . CATARACT EXTRACTION, BILATERAL    . COLONOSCOPY W/ BIOPSIES    . ESOPHAGEAL MANOMETRY N/A 03/20/2015   Procedure: ESOPHAGEAL MANOMETRY (EM);  Surgeon: Gatha Mayer, MD;  Location: [facility];  Service: Endoscopy;  Laterality: N/A;  . EYE SURGERY     mole removed left eye  . LAPAROSCOPIC NISSEN FUNDOPLICATION  3005  . repair prolapsedbladder    . RIGHT COLECTOMY  Nov. 27, 2006  . TMJ ARTHROPLASTY    . UPPER GASTROINTESTINAL ENDOSCOPY        Social History:     Social History   Tobacco Use  . Smoking status: Never Smoker  . Smokeless tobacco: Never Used  Substance Use Topics  . Alcohol use: No     Lives - at home  Mobility - walks by self   Family History :     Family History  Problem Relation Age of Onset  . Lymphoma Other   . Bone cancer Father   . Diabetes Daughter   . Hypertension Daughter   . Heart attack Daughter   . Suicidality Son        deceased  . Colon cancer Neg  Hx   . Stomach cancer Neg Hx      Home Medications:   Prior to Admission medications   Medication Sig Start Date End Date Taking? Authorizing Provider  amLODipine (NORVASC) 2.5 MG tablet take 1 tablet by mouth once daily 05/22/17  Yes Mayo, Pete Pelt, MD  aspirin 81 MG chewable tablet Chew 81 mg by mouth daily.     Yes [provider]  Blood Glucose Monitoring Suppl (ONETOUCH VERIO) w/Device KIT 1 each by Does not apply route daily. 07/05/17  Yes Mayo, Pete Pelt, MD  cetirizine (ZYRTEC) 10 MG tablet Take 1 tablet (10 mg total) by mouth daily. Patient taking differently: Take 10 mg by mouth daily as needed for allergies.  07/05/17  Yes Mayo, Pete Pelt, MD  esomeprazole (NEXIUM) 40 MG packet Take 40 mg by mouth daily before breakfast. Patient taking differently: Take 40 mg by mouth daily as needed (reflux).  07/05/17  Yes Mayo, Pete Pelt, MD  fluticasone [facility]) 50 MCG/ACT nasal spray Place 2 sprays into both nostrils daily. Patient taking differently: Place 2 sprays into both nostrils daily as needed for allergies.  07/05/17  Yes Mayo, Pete Pelt, MD  glucose blood [facility] VERIO) test strip Use as instructed 07/05/17  Yes Mayo, Pete Pelt, MD  ibuprofen (ADVIL,MOTRIN) 200 MG tablet Take 2 tablets (400 mg total) by mouth every 6 (six) hours as needed (pain). 10/07/15  Yes Mesner, Corene Cornea, MD  Insulin Glargine (LANTUS SOLOSTAR) 100 UNIT/ML Solostar Pen Inject 10 Units into the skin daily at 10 pm. 08/08/17  Yes Mayo, Pete Pelt, MD  Insulin Pen Needle 31G X 5 MM MISC Check blood sugar once daily 08/08/17  Yes Mayo, Pete Pelt, MD  Lancet Devices (ONE TOUCH DELICA LANCING DEV) MISC Check blood sugar once daily 07/05/17  Yes Mayo, Pete Pelt, MD  nitroGLYCERIN (NITROSTAT) 0.4 MG SL tablet Place 1 tablet (0.4 mg total) under the tongue every 5 (five) minutes as needed. If chest pain not relieved by third tab call 911. 11/24/14  Yes Olam Idler, MD  [facility] DELICA LANCETS 11M MISC Check blood sugar  once daily 07/05/17  Yes Mayo, Pete Pelt, MD  polyethylene glycol powder (GLYCOLAX/MIRALAX) powder Take 255 g by mouth daily. Patient taking differently: Take 1 Container by mouth daily as needed for mild constipation or moderate constipation.  08/08/17  Yes Mayo, Pete Pelt, MD  quinapril-hydrochlorothiazide (ACCURETIC) 20-12.5 MG tablet take 2 tablets by mouth once daily 10/05/16  Yes Mayo, Pete Pelt, MD  rosuvastatin (CRESTOR) 20 MG tablet Take 1 tablet (20 mg total) daily by mouth. 02/27/17  Yes Mayo, Pete Pelt, MD  triamcinolone cream (KENALOG) 0.1 % Apply 1 application topically 2 (two) times daily. Patient taking differently: Apply 1 application topically 2 (two) times daily as needed (rash).  08/11/17  Yes Mayo, Pete Pelt, MD  baclofen (LIORESAL) 10 MG tablet Take 1 tablet (10 mg total) by mouth 3 (three) times daily. Patient not taking: Reported on 08/19/2017 01/03/17   Mayo, Pete Pelt, MD  clotrimazole (LOTRIMIN) 1 % cream Apply 1 application topically 2 (two) times daily. Patient not taking: Reported on 08/19/2017 01/03/17   Mayo, Pete Pelt, MD  sennosides-docusate sodium (SENOKOT-S) 8.6-50 MG tablet Take 2 tablets by mouth 2 (two) times daily. Patient not taking: Reported on 08/19/2017 08/08/17   Mayo, Pete Pelt, MD  Triamcinolone Acetonide (TRIAMCINOLONE 0.1 % CREAM : EUCERIN) CREA Apply 1 application topically 2 (two) times daily as needed for itching. Patient not taking: Reported on 08/19/2017 08/11/17   Mayo, Pete Pelt, MD     Allergies:     Allergies  Allergen Reactions  . Metformin And Related Diarrhea     Physical Exam:   Vitals  Blood pressure 132/71, pulse 80, temperature 98.8 F (37.1 C), temperature source Oral, resp. rate (!) 23, height _0  (1.626 m), weight 61.2 kg (135 lb), SpO2 97 %.   1. General lying in bed in NAD,    2. Normal affect and insight, Not Suicidal or Homicidal, Awake Alert, Oriented X 3.  3. No F.N deficits, ALL C.Nerves Intact, Strength 5/5 all 4  extremities, Sensation intact all 4 extremities, Plantars down going.  4. Ears and Eyes appear Normal, Conjunctivae clear, PERRLA. Moist Oral Mucosa.  5. Supple Neck, No JVD, No cervical lymphadenopathy appriciated, No Carotid Bruits.  6. Symmetrical Chest wall movement, Good air movement bilaterally, CTAB.  7. RRR, No Gallops, Rubs or Murmurs, No Parasternal Heave.  8. Positive Bowel Sounds, Abdomen Soft, No tenderness, No organomegaly appriciated,No rebound -guarding or rigidity.  9.  No Cyanosis, Normal Skin Turgor, No Skin Rash or Bruise.  10. Good muscle tone,  joints appear normal , no effusions, Normal ROM.  11. No Palpable Lymph Nodes in Neck or Axillae  Chest pain reproducible with palpation   Data Review:    CBC Recent Labs  Lab 08/19/17 1908  WBC 8.6  HGB 10.3*  HCT 31.6*  PLT 238  MCV 85.6  MCH 27.9  MCHC 32.6  RDW 13.0   ------------------------------------------------------------------------------------------------------------------  Chemistries  Recent Labs  Lab 08/19/17 1908  NA 135  K 3.2*  CL 97*  CO2 24  GLUCOSE 533*  BUN 41*  CREATININE 1.82*  CALCIUM 9.6   ------------------------------------------------------------------------------------------------------------------ estimated creatinine clearance is 24.8 mL/min (A) (by C-G formula based on SCr of 1.82 mg/dL (H)). ------------------------------------------------------------------------------------------------------------------ No results for input(s): TSH, T4TOTAL, T3FREE, THYROIDAB in the last 72 hours.  Invalid input(s): FREET3  Coagulation profile No results for input(s): INR, PROTIME in the last 168 hours. ------------------------------------------------------------------------------------------------------------------- No results for input(s): DDIMER in the last 72  hours. -------------------------------------------------------------------------------------------------------------------  Cardiac Enzymes No results for input(s): CKMB, TROPONINI, MYOGLOBIN in  the last 168 hours.  Invalid input(s): CK ------------------------------------------------------------------------------------------------------------------ No results found for: BNP   ---------------------------------------------------------------------------------------------------------------  Urinalysis    Component Value Date/Time   COLORURINE STRAW (A) 08/19/2017 [place] 08/19/2017 1917   LABSPEC 1.022 08/19/2017 1917   PHURINE 5.0 08/19/2017 1917   GLUCOSEU >=500 (A) 08/19/2017 1917   HGBUR SMALL (A) 08/19/2017 1917   HGBUR trace-intact 03/01/2010 1123   [place] 08/19/2017 1917   BILIRUBINUR NEG 09/18/2014 1534   KETONESUR NEGATIVE 08/19/2017 1917   PROTEINUR NEGATIVE 08/19/2017 1917   UROBILINOGEN 0.2 09/18/2014 1534   UROBILINOGEN 0.2 03/12/2012 1835   NITRITE NEGATIVE 08/19/2017 1917   LEUKOCYTESUR NEGATIVE 08/19/2017 1917    ----------------------------------------------------------------------------------------------------------------   Imaging Results:    Ct Abdomen Pelvis Wo Contrast  Result Date: 08/20/2017 CLINICAL DATA:  72 year old female with lower abdominal pain. EXAM: CT ABDOMEN AND PELVIS WITHOUT CONTRAST TECHNIQUE: Multidetector CT imaging of the abdomen and pelvis was performed following the standard protocol without IV contrast. COMPARISON:  CT of abdomen pelvis dated 12/02/2013 FINDINGS: Evaluation of this exam is limited in the absence of intravenous contrast. Lower chest: Minimal bibasilar atelectatic changes. The visualized lung bases are otherwise clear. No intra-abdominal free air or free fluid. Hepatobiliary: Choose 1 Pancreas: Unremarkable. No pancreatic ductal dilatation or surrounding inflammatory changes. Spleen: Normal  in size without focal abnormality. Adrenals/Urinary Tract: Adrenal glands are unremarkable. Kidneys are normal, without renal calculi, focal lesion, or hydronephrosis. Bladder is unremarkable. Stomach/Bowel: Postprocedural changes of Nissen fundoplication. There is thickening of the proximal stomach which may be related to underdistention or represent gastritis. Clinical correlation is recommended. No bowel obstruction. Postsurgical changes of partial right hemicolectomy. Appendectomy. Vascular/Lymphatic: There is moderate aortoiliac atherosclerotic disease. No portal venous gas. There is no adenopathy. Reproductive: Hysterectomy.  No pelvic mass. Other: None Musculoskeletal: No acute or significant osseous findings. Stimulator device in the left lower back with wire extending into the right sacral region. IMPRESSION: 1. Underdistention the stomach versus gastritis. Clinical correlation is recommended. No bowel obstruction. 2.  Aortic Atherosclerosis (ICD10-I70.0). Electronically Signed   By: Anner Crete M.D.   On: 08/20/2017 03:12   Dg Chest 2 View  Result Date: 08/19/2017 CLINICAL DATA:  Left-sided chest pain for several hours, initial encounter EXAM: CHEST - 2 VIEW COMPARISON:  12/06/2013 FINDINGS: Cardiac shadow is within normal limits. The lungs are well aerated bilaterally. Tortuous vascularity contributes to a prominent superior mediastinum but stable in appearance from the prior study. No focal infiltrate or sizable effusion is seen. No acute bony abnormality is noted. Postoperative change in the upper abdomen is seen. IMPRESSION: No acute abnormality noted. Electronically Signed   By: Inez Catalina M.D.   On: 08/19/2017 19:56      Assessment & Plan:    Principal Problem:   ARF (acute renal failure) (HCC) Active Problems:   DM (diabetes mellitus), type 2, uncontrolled (HCC)   Chest pain   Sciatica of left side   Chest pain Tele Trop I q6h x 3 Check lipid Check cardiac echo Please  check for reproducibility of chest pain If reproducible as for me consider outpatient stress testing otherwise could involve cardiology for inpatient stress testing Cont Asprin 57m po qday Cont Crestor 259mpo qday  ARF on CRF Hydrate with ns iv gently Hold Accuretic temporarily STOP IBUPROFEN NO NSAIDS please Check cmp in am  Hypokalemia Replete Check cmp in am  Hypertension Cont Amlodipine 2.48m87mo qday  Sciatica bilateral LE Check CT scan Lumbar  spine No Mri due to ? Bladder stimulator  Gerd Cont PPI  Dm2 Cont lantus fsbs ac and qhs, ISS      DVT Prophylaxis heparin - SCDs  AM Labs Ordered, also please review Full Orders  Family Communication: Admission, patients condition and plan of care including tests being ordered have been discussed with the patient  who indicate understanding and agree with the plan and Code Status.  Code Status FULL CODE  Likely DC to  home  Condition GUARDED    Consults called: none  Admission status: inpatient  Time spent in minutes : 45    Jani Gravel M.D on 08/20/2017 at 6:01 AM  Between 7am to 7pm - Pager - [phone]. After 7pm go to [email or web address] - password [facility]  [facility] - Office  [phone]

## 2017-08-20 NOTE — ED Notes (Signed)
Attempted to call report. No answer on 5E

## 2017-08-20 NOTE — ED Notes (Signed)
Pt provided meal tray

## 2017-08-20 NOTE — Progress Notes (Signed)
[name]  is a 72 y.o. female, w h/o colon cancer, s/p R colectomy (08/24/2010), Dm2, hypertension, hyperlipidemia, CAD s/p cardiac cath (05/09/2001) => 20% LM, 50% mid LAD, 20%, AV circumflex, 20-30% mid RCA who presents with c/o back pain with radiation down bilateral legs. She also reports some sub sternal pain , which is reproducible on exam.   She reports the pain is better and almost gone.  She was admitted for evaluation of ACS:  TRAMADOL FOR BACK PAIN.   Hosie Poisson, MD [phone]

## 2017-08-20 NOTE — ED Notes (Signed)
Attempted to call report. No answer.

## 2017-08-20 NOTE — ED Notes (Signed)
Called 5W- Network engineer not aware of any happenings on 5E. Does not know which RN is getting pt. Transferred to 0500 without success.

## 2017-08-20 NOTE — Progress Notes (Signed)
  Echocardiogram 2D Echocardiogram has been performed.  Darlina Sicilian M 08/20/2017, 2:53 PM

## 2017-08-21 DIAGNOSIS — R531 Weakness: Secondary | ICD-10-CM

## 2017-08-21 LAB — CBC
HCT: 29.1 % — ABNORMAL LOW (ref 36.0–46.0)
Hemoglobin: 9.5 g/dL — ABNORMAL LOW (ref 12.0–15.0)
MCH: 27.9 pg (ref 26.0–34.0)
MCHC: 32.6 g/dL (ref 30.0–36.0)
MCV: 85.3 fL (ref 78.0–100.0)
Platelets: 198 10*3/uL (ref 150–400)
RBC: 3.41 MIL/uL — ABNORMAL LOW (ref 3.87–5.11)
RDW: 12.7 % (ref 11.5–15.5)
WBC: 7 10*3/uL (ref 4.0–10.5)

## 2017-08-21 LAB — HEMOGLOBIN A1C
Hgb A1c MFr Bld: 11 % — ABNORMAL HIGH (ref 4.8–5.6)
Mean Plasma Glucose: 269 mg/dL

## 2017-08-21 LAB — BASIC METABOLIC PANEL
Anion gap: 8 (ref 5–15)
BUN: 29 mg/dL — ABNORMAL HIGH (ref 6–20)
CO2: 23 mmol/L (ref 22–32)
Calcium: 9.1 mg/dL (ref 8.9–10.3)
Chloride: 113 mmol/L — ABNORMAL HIGH (ref 101–111)
Creatinine, Ser: 1.18 mg/dL — ABNORMAL HIGH (ref 0.44–1.00)
GFR calc Af Amer: 53 mL/min — ABNORMAL LOW (ref >60)
GFR calc non Af Amer: 46 mL/min — ABNORMAL LOW (ref >60)
Glucose, Bld: 249 mg/dL — ABNORMAL HIGH (ref 65–99)
Potassium: 4.5 mmol/L (ref 3.5–5.1)
Sodium: 144 mmol/L (ref 135–145)

## 2017-08-21 LAB — GLUCOSE, CAPILLARY
Glucose-Capillary: 296 mg/dL — ABNORMAL HIGH (ref 65–99)
Glucose-Capillary: 250 mg/dL — ABNORMAL HIGH (ref 65–99)
Glucose-Capillary: 254 mg/dL — ABNORMAL HIGH (ref 65–99)
Glucose-Capillary: 237 mg/dL — ABNORMAL HIGH (ref 65–99)

## 2017-08-21 LAB — COMPREHENSIVE METABOLIC PANEL
ALT: 16 U/L (ref 14–54)
AST: 16 U/L (ref 15–41)
Albumin: 3.5 g/dL (ref 3.5–5.0)
Alkaline Phosphatase: 55 U/L (ref 38–126)
Anion gap: 10 (ref 5–15)
BUN: 35 mg/dL — ABNORMAL HIGH (ref 6–20)
CO2: 23 mmol/L (ref 22–32)
Calcium: 9.3 mg/dL (ref 8.9–10.3)
Chloride: 109 mmol/L (ref 101–111)
Creatinine, Ser: 1.4 mg/dL — ABNORMAL HIGH (ref 0.44–1.00)
GFR calc Af Amer: 43 mL/min — ABNORMAL LOW (ref >60)
GFR calc non Af Amer: 37 mL/min — ABNORMAL LOW (ref >60)
Glucose, Bld: 348 mg/dL — ABNORMAL HIGH (ref 65–99)
Potassium: 5.9 mmol/L — ABNORMAL HIGH (ref 3.5–5.1)
Sodium: 142 mmol/L (ref 135–145)
Total Bilirubin: 0.7 mg/dL (ref 0.3–1.2)
Total Protein: 7 g/dL (ref 6.5–8.1)

## 2017-08-21 MED ORDER — SODIUM CHLORIDE 0.9 % IV SOLN
INTRAVENOUS | Status: DC
  Administered 2017-08-21 (×2): via INTRAVENOUS

## 2017-08-21 MED ORDER — ASPIRIN 81 MG PO CHEW
81.0000 mg | CHEWABLE_TABLET | Freq: Every day | ORAL | Status: DC
  Administered 2017-08-21 – 2017-08-24 (×4): 81 mg via ORAL
  Filled 2017-08-21 (×4): qty 1

## 2017-08-21 MED ORDER — ONDANSETRON HCL 4 MG/2ML IJ SOLN
4.0000 mg | Freq: Four times a day (QID) | INTRAMUSCULAR | Status: DC | PRN
  Administered 2017-08-21: 4 mg via INTRAVENOUS
  Filled 2017-08-21: qty 2

## 2017-08-21 MED ORDER — AMLODIPINE BESYLATE 5 MG PO TABS: 2.5000 mg | ORAL_TABLET | Freq: Every day | ORAL | Status: DC

## 2017-08-21 MED ORDER — INSULIN ASPART 100 UNIT/ML ~~LOC~~ SOLN
3.0000 [IU] | Freq: Three times a day (TID) | SUBCUTANEOUS | Status: DC
  Administered 2017-08-21 – 2017-08-24 (×7): 3 [IU] via SUBCUTANEOUS

## 2017-08-21 MED ORDER — ROSUVASTATIN CALCIUM 10 MG PO TABS
20.0000 mg | ORAL_TABLET | Freq: Every day | ORAL | Status: DC
  Administered 2017-08-21 – 2017-08-23 (×3): 20 mg via ORAL
  Filled 2017-08-21 (×3): qty 2

## 2017-08-21 MED ORDER — FAMOTIDINE IN NACL 20-0.9 MG/50ML-% IV SOLN
20.0000 mg | Freq: Once | INTRAVENOUS | Status: AC
  Administered 2017-08-21: 20 mg via INTRAVENOUS
  Filled 2017-08-21: qty 50

## 2017-08-21 MED ORDER — SODIUM POLYSTYRENE SULFONATE 15 GM/60ML PO SUSP
15.0000 g | Freq: Once | ORAL | Status: AC
  Administered 2017-08-21: 15 g via ORAL
  Filled 2017-08-21: qty 60

## 2017-08-21 MED ORDER — GI COCKTAIL ~~LOC~~
30.0000 mL | Freq: Once | ORAL | Status: AC
Start: 2017-08-21 — End: 2017-08-21
  Administered 2017-08-21: 30 mL via ORAL
  Filled 2017-08-21: qty 30

## 2017-08-21 MED ORDER — POLYETHYLENE GLYCOL 3350 17 G PO PACK: 17.0000 g | PACK | Freq: Every day | ORAL | Status: DC | PRN

## 2017-08-21 MED ORDER — ESOMEPRAZOLE MAGNESIUM 20 MG PO CPDR
40.0000 mg | DELAYED_RELEASE_CAPSULE | Freq: Two times a day (BID) | ORAL | Status: DC
  Administered 2017-08-21 – 2017-08-24 (×6): 40 mg via ORAL
  Filled 2017-08-21 (×6): qty 2

## 2017-08-21 MED ORDER — INSULIN ASPART 100 UNIT/ML ~~LOC~~ SOLN
0.0000 [IU] | Freq: Every day | SUBCUTANEOUS | Status: DC
  Administered 2017-08-21: 2 [IU] via SUBCUTANEOUS
  Administered 2017-08-23: 3 [IU] via SUBCUTANEOUS

## 2017-08-21 MED ORDER — INSULIN ASPART 100 UNIT/ML ~~LOC~~ SOLN
0.0000 [IU] | Freq: Three times a day (TID) | SUBCUTANEOUS | Status: DC
  Administered 2017-08-21: 3 [IU] via SUBCUTANEOUS
  Administered 2017-08-21 (×2): 5 [IU] via SUBCUTANEOUS
  Administered 2017-08-22: 2 [IU] via SUBCUTANEOUS
  Administered 2017-08-22: 7 [IU] via SUBCUTANEOUS
  Administered 2017-08-22 – 2017-08-23 (×2): 2 [IU] via SUBCUTANEOUS
  Administered 2017-08-23 – 2017-08-24 (×2): 1 [IU] via SUBCUTANEOUS

## 2017-08-21 MED ORDER — INSULIN GLARGINE 100 UNIT/ML ~~LOC~~ SOLN
10.0000 [IU] | Freq: Every day | SUBCUTANEOUS | Status: DC
  Administered 2017-08-21 – 2017-08-23 (×3): 10 [IU] via SUBCUTANEOUS
  Filled 2017-08-21 (×3): qty 0.1

## 2017-08-21 MED ORDER — PROMETHAZINE HCL 25 MG/ML IJ SOLN
12.5000 mg | Freq: Four times a day (QID) | INTRAMUSCULAR | Status: DC | PRN
  Administered 2017-08-21: 12.5 mg via INTRAVENOUS
  Filled 2017-08-21: qty 1

## 2017-08-21 NOTE — Progress Notes (Signed)
PROGRESS NOTE    [name]  ZJQ:[id number] DOB: June 27, 1945 DOA: 08/19/2017 PCP: Sela Hua, MD    Brief Narrative: 72 year old female with non obstructive CAD, hypertension, DM, presents with sciatica of the left side, associated with some sub sternal chest pain, worsens with eating, associated with nausea, vomiting.  She was also found to be in acute renal failure.  Assessment & Plan:   Principal Problem:   ARF (acute renal failure) (HCC) Active Problems:   DM (diabetes mellitus), type 2, uncontrolled ([place])   Chest pain   Sciatica of left side   Acute renal failure.  Pt has a baseline creatinine between 1.2 to 1.6.  Admitted with a creatinine to 1.8, improving with hydration. It has stabilized to a creatinine of 1.4.  She appears to have some baseline renal insufficiency.  CT doe snot show any hydronephrosis.  UA is negative.  Urine electrolytes will be ordered.     Diabetes Mellitus: uncontrolled with hyperglycemia.  CBG (last 3)  Recent Labs    08/20/17 0023 08/21/17 0908 08/21/17 1155  GLUCAP 326* 296* 250*   Started her back ton lantus 10 units, and added novolog 3 units TIDAC.  Resume SSI HGBA1C IS 11.    Chest pressure: Suspect gastritis vs esophagitis.  Will try BID PPI, IV pepcid and GI cocktail, if it doesn't t improve will call GI in am for a consult for possible EGD.   Left side sciatica:  Tramadol and tylenol for pain.  CT does not show sig stenosis.    Hypertension;  Well controlled.      DVT prophylaxis: sq heparin.  Code Status: full code.  Family Communication:none at bedside.  Disposition Plan: pending resolution of the back pain.   Consultants:   None.    Procedures: none.    Antimicrobials:none.    Subjective: Reports nauseated, and vomited.   Objective: Vitals:   08/20/17 2103 08/21/17 0500 08/21/17 0625 08/21/17 1459  BP: (!) 177/66 133/72  109/77  Pulse: 92 79  79  Resp: 18 18  18   Temp: 98.3 F (36.8  C) 98.3 F (36.8 C)  98.5 F (36.9 C)  TempSrc: Oral Oral  Oral  SpO2: 100% 100%  95%  Weight:   61.3 kg (135 lb 2.3 oz)   Height:        Intake/Output Summary (Last 24 hours) at 08/21/2017 1542 Last data filed at 08/21/2017 1500 Gross per 24 hour  Intake 2088.75 ml  Output -  Net 2088.75 ml   Filed Weights   08/19/17 1859 08/21/17 0625  Weight: 61.2 kg (135 lb) 61.3 kg (135 lb 2.3 oz)    Examination:  General exam: Appears calm and comfortable  Respiratory system: Clear to auscultation. Respiratory effort normal. Cardiovascular system: S1 & S2 heard, RRR. No JVD, murmurs, rubs, gallops or clicks. No pedal edema. Gastrointestinal system: Abdomen is nondistended, soft and nontender. No organomegaly or masses felt. Normal bowel sounds heard. Central nervous system: Alert and oriented. No focal neurological deficits. Extremities: Symmetric 5 x 5 power. Skin: No rashes, lesions or ulcers Psychiatry: . Mood & affect appropriate.     Data Reviewed: I have personally reviewed following labs and imaging studies  CBC: Recent Labs  Lab 08/19/17 1908 08/20/17 0754 08/21/17 0600  WBC 8.6 9.2 7.0  HGB 10.3* 9.6* 9.5*  HCT 31.6* 29.1* 29.1*  MCV 85.6 84.8 85.3  PLT 238 186 240   Basic Metabolic Panel: Recent Labs  Lab 08/19/17 1908  08/20/17 0754 08/21/17 0600  NA 135 136 142  K 3.2* 3.1* 5.9*  CL 97* 100* 109  CO2 24 22 23   GLUCOSE 533* 226* 348*  BUN 41* 35* 35*  CREATININE 1.82* 1.38*  1.33* 1.40*  CALCIUM 9.6 8.9 9.3   GFR: Estimated Creatinine Clearance: 32.3 mL/min (A) (by C-G formula based on SCr of 1.4 mg/dL (H)). Liver Function Tests: Recent Labs  Lab 08/21/17 0600  AST 16  ALT 16  ALKPHOS 55  BILITOT 0.7  PROT 7.0  ALBUMIN 3.5   No results for input(s): LIPASE, AMYLASE in the last 168 hours. No results for input(s): AMMONIA in the last 168 hours. Coagulation Profile: No results for input(s): INR, PROTIME in the last 168 hours. Cardiac  Enzymes: Recent Labs  Lab 08/20/17 0754 08/20/17 1344 08/20/17 2003  CKTOTAL 125  --   --   TROPONINI <0.03 <0.03 <0.03   BNP (last 3 results) No results for input(s): PROBNP in the last 8760 hours. HbA1C: Recent Labs    08/21/17 0600  HGBA1C 11.0*   CBG: Recent Labs  Lab 08/20/17 0023 08/21/17 0908 08/21/17 1155  GLUCAP 326* 296* 250*   Lipid Profile: No results for input(s): CHOL, HDL, LDLCALC, TRIG, CHOLHDL, LDLDIRECT in the last 72 hours. Thyroid Function Tests: No results for input(s): TSH, T4TOTAL, FREET4, T3FREE, THYROIDAB in the last 72 hours. Anemia Panel: No results for input(s): VITAMINB12, FOLATE, FERRITIN, TIBC, IRON, RETICCTPCT in the last 72 hours. Sepsis Labs: No results for input(s): PROCALCITON, LATICACIDVEN in the last 168 hours.  No results found for this or any previous visit (from the past 240 hour(s)).       Radiology Studies: Ct Abdomen Pelvis Wo Contrast  Result Date: 08/20/2017 CLINICAL DATA:  72 year old female with lower abdominal pain. EXAM: CT ABDOMEN AND PELVIS WITHOUT CONTRAST TECHNIQUE: Multidetector CT imaging of the abdomen and pelvis was performed following the standard protocol without IV contrast. COMPARISON:  CT of abdomen pelvis dated 12/02/2013 FINDINGS: Evaluation of this exam is limited in the absence of intravenous contrast. Lower chest: Minimal bibasilar atelectatic changes. The visualized lung bases are otherwise clear. No intra-abdominal free air or free fluid. Hepatobiliary: Choose 1 Pancreas: Unremarkable. No pancreatic ductal dilatation or surrounding inflammatory changes. Spleen: Normal in size without focal abnormality. Adrenals/Urinary Tract: Adrenal glands are unremarkable. Kidneys are normal, without renal calculi, focal lesion, or hydronephrosis. Bladder is unremarkable. Stomach/Bowel: Postprocedural changes of Nissen fundoplication. There is thickening of the proximal stomach which may be related to underdistention or  represent gastritis. Clinical correlation is recommended. No bowel obstruction. Postsurgical changes of partial right hemicolectomy. Appendectomy. Vascular/Lymphatic: There is moderate aortoiliac atherosclerotic disease. No portal venous gas. There is no adenopathy. Reproductive: Hysterectomy.  No pelvic mass. Other: None Musculoskeletal: No acute or significant osseous findings. Stimulator device in the left lower back with wire extending into the right sacral region. IMPRESSION: 1. Underdistention the stomach versus gastritis. Clinical correlation is recommended. No bowel obstruction. 2.  Aortic Atherosclerosis (ICD10-I70.0). Electronically Signed   By: Anner Crete M.D.   On: 08/20/2017 03:12   Dg Chest 2 View  Result Date: 08/19/2017 CLINICAL DATA:  Left-sided chest pain for several hours, initial encounter EXAM: CHEST - 2 VIEW COMPARISON:  12/06/2013 FINDINGS: Cardiac shadow is within normal limits. The lungs are well aerated bilaterally. Tortuous vascularity contributes to a prominent superior mediastinum but stable in appearance from the prior study. No focal infiltrate or sizable effusion is seen. No acute bony abnormality is noted. Postoperative  change in the upper abdomen is seen. IMPRESSION: No acute abnormality noted. Electronically Signed   By: Inez Catalina M.D.   On: 08/19/2017 19:56   Ct Lumbar Spine Wo Contrast  Result Date: 08/20/2017 CLINICAL DATA:  Low back pain extending to both legs. Symptoms began 08/18/2017 EXAM: CT LUMBAR SPINE WITHOUT CONTRAST TECHNIQUE: Multidetector CT imaging of the lumbar spine was performed without intravenous contrast administration. Multiplanar CT image reconstructions were also generated. COMPARISON:  CT abdomen same day. FINDINGS: Segmentation: 5 lumbar type vertebral bodies. Alignment: Normal Vertebrae: Normal.  No fracture or focal lesion. Paraspinal and other soft tissues: Negative Disc levels: No evidence of degenerative disc disease. Discs show  normal height. No significant bulge. No herniation. No stenosis of the canal or foramina. Patient has ordinary mild facet osteoarthritis at the L5-S1 level, right more than left, quite common at this age. No advanced finding. 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 Electronically Signed   By: Nelson Chimes M.D.   On: 08/20/2017 07:03        Scheduled Meds: . amLODipine  2.5 mg Oral Daily  . aspirin  81 mg Oral Daily  . esomeprazole  40 mg Oral BID AC  . heparin  5,000 Units Subcutaneous Q8H  . insulin aspart  0-5 Units Subcutaneous QHS  . insulin aspart  0-9 Units Subcutaneous TID WC  . Insulin Glargine  10 Units Subcutaneous Q2200  . rosuvastatin  20 mg Oral Daily   Continuous Infusions: . sodium chloride 75 mL/hr at 08/21/17 0836     LOS: 1 day    Time spent: 35 minutes    Hosie Poisson, MD [facility] Pager [phone]  If 7PM-7AM, please contact night-coverage [email or web address] Password [facility] 08/21/2017, 3:42 PM

## 2017-08-22 DIAGNOSIS — K5900 Constipation, unspecified: Secondary | ICD-10-CM

## 2017-08-22 DIAGNOSIS — K219 Gastro-esophageal reflux disease without esophagitis: Secondary | ICD-10-CM

## 2017-08-22 DIAGNOSIS — R110 Nausea: Secondary | ICD-10-CM

## 2017-08-22 LAB — BASIC METABOLIC PANEL
Anion gap: 8 (ref 5–15)
BUN: 21 mg/dL — ABNORMAL HIGH (ref 6–20)
CO2: 23 mmol/L (ref 22–32)
Calcium: 9.2 mg/dL (ref 8.9–10.3)
Chloride: 111 mmol/L (ref 101–111)
Creatinine, Ser: 1.02 mg/dL — ABNORMAL HIGH (ref 0.44–1.00)
GFR calc Af Amer: >60 mL/min (ref >60)
GFR calc non Af Amer: 54 mL/min — ABNORMAL LOW (ref >60)
Glucose, Bld: 205 mg/dL — ABNORMAL HIGH (ref 65–99)
Potassium: 3.9 mmol/L (ref 3.5–5.1)
Sodium: 142 mmol/L (ref 135–145)

## 2017-08-22 LAB — GLUCOSE, CAPILLARY
Glucose-Capillary: 174 mg/dL — ABNORMAL HIGH (ref 65–99)
Glucose-Capillary: 200 mg/dL — ABNORMAL HIGH (ref 65–99)
Glucose-Capillary: 316 mg/dL — ABNORMAL HIGH (ref 65–99)
Glucose-Capillary: 181 mg/dL — ABNORMAL HIGH (ref 65–99)

## 2017-08-22 MED ORDER — SODIUM CHLORIDE 0.9 % IV SOLN
INTRAVENOUS | Status: DC
  Administered 2017-08-22: 14:00:00 via INTRAVENOUS

## 2017-08-22 MED ORDER — PROMETHAZINE HCL 25 MG/ML IJ SOLN: 12.5000 mg | Freq: Four times a day (QID) | INTRAMUSCULAR | Status: DC | PRN

## 2017-08-22 MED ORDER — POLYETHYLENE GLYCOL 3350 17 G PO PACK
17.0000 g | PACK | Freq: Two times a day (BID) | ORAL | Status: DC
  Administered 2017-08-22 – 2017-08-24 (×5): 17 g via ORAL
  Filled 2017-08-22 (×6): qty 1

## 2017-08-22 NOTE — Consult Note (Signed)
Consultation  Referring Provider: Dr. Karleen Hampshire     Primary Care Physician:  Mayo, Pete Pelt, MD Primary Gastroenterologist:  Dr. Carlean Purl       Reason for Consultation:  Substernal c/p, nausea, dysphagia             HPI:   [name] is a 72 y.o. female with past medical history as listed below including colon cancer, status post right colectomy (08/24/2010), who presented to the ER on 08/20/2017 with a complaint of back pain with radiation into bilateral legs.  Also describes sharp chest pain in the left breast lasting a few minutes, worse with movement and reproducible with palpation which started 08/19/2017.  We are consulted today in regards to ongoing substernal chest pain and nausea of noncardiac origin.    Today, patient be noted that portion patient is a poor historian, she explains that she is having an epigastric pressure which is worse when she eats.  She tells me that food and pills have been sticking in her throat for the past 3 to 4 months "just like they were before".  Describes having her throat stretched in the past.  Explained she does not have trouble with heartburn or reflux symptoms but has a constant epigastric pressure rated as a 2-3/10 worse with eating.  She has been using some NSAIDs for sciatic pain recently.  She is on Nexium 40 mg daily at home.  History of Niesen fundoplication.    Also describes history of worsening constipation.  Review of chart and patient she has been seeing her primary care physician in regards to this and has been on a regimen of MiraLAX every 3 to 4 days, daily stool softeners and occasional Senokot.  Patient describes that she can go sometimes a week in between a bowel movement and typically this is when her nausea is worse.    Denies fever, chills, blood in stool, weight loss, anorexia or vomiting.  Hospital course: CT abdomen and pelvis with underdistention in the stomach versus gastritis, no bowel obstruction and aortic atherosclerosis.   Admitted with acute renal failure, this is improving with hydration creatinine is stabilized to 1.4.  Some baseline renal insufficiency.  No improvement with twice daily PPI, IV Pepcid and GI cocktail.  Left-sided sciatica being treated with tramadol and Tylenol.  CT does not show significant stenosis.  Previous GI history: 07/14/2016 colonoscopy Dr. Carlean Purl: End-to-side ileocolonic anastomosis characterized by healthy-appearing mucosa, otherwise normal exam.  Repeat recommended in 5 years. 09/10/2015 EGD Dr. Carlean Purl: Esophageal stenosis the GE junction, mild, dilated to 20 mm, Nissen fundoplication was found with an intact wrap, otherwise normal exam (Further records in chart)  Past Medical History:  Diagnosis Date  . Allergy   . Asthma   . Blood transfusion without reported diagnosis 12/23/2004   with colon cancer  . Brain tumor (benign) ([place])   . Colon cancer [facility]) Nov. 27,  2006  . Diabetes mellitus    Type 2  . Female bladder prolapse   . Gastritis   . GERD (gastroesophageal reflux disease)   . Glaucoma   . Hypercholesterolemia   . Hypertension     Past Surgical History:  Procedure Laterality Date  . ABDOMINAL HYSTERECTOMY  1976  . bladder tac    . CARDIAC CATHETERIZATION  2005  . CATARACT EXTRACTION, BILATERAL    . COLONOSCOPY W/ BIOPSIES    . ESOPHAGEAL MANOMETRY N/A 03/20/2015   Procedure: ESOPHAGEAL MANOMETRY (EM);  Surgeon: Gatha Mayer,  MD;  Location: [facility] ENDOSCOPY;  Service: Endoscopy;  Laterality: N/A;  . EYE SURGERY     mole removed left eye  . LAPAROSCOPIC NISSEN FUNDOPLICATION  8250  . repair prolapsedbladder    . RIGHT COLECTOMY  Nov. 27, 2006  . TMJ ARTHROPLASTY    . UPPER GASTROINTESTINAL ENDOSCOPY      Family History  Problem Relation Age of Onset  . Lymphoma Other   . Bone cancer Father   . Diabetes Daughter   . Hypertension Daughter   . Heart attack Daughter   . Suicidality Son        deceased  . Colon cancer Neg Hx   . Stomach cancer Neg Hx        Social History   Tobacco Use  . Smoking status: Never Smoker  . Smokeless tobacco: Never Used  Substance Use Topics  . Alcohol use: No  . Drug use: No    Prior to Admission medications   Medication Sig Start Date End Date Taking? Authorizing Provider  amLODipine (NORVASC) 2.5 MG tablet take 1 tablet by mouth once daily 05/22/17  Yes Mayo, Pete Pelt, MD  aspirin 81 MG chewable tablet Chew 81 mg by mouth daily.     Yes [provider]  Blood Glucose Monitoring Suppl (ONETOUCH VERIO) w/Device KIT 1 each by Does not apply route daily. 07/05/17  Yes Mayo, Pete Pelt, MD  cetirizine (ZYRTEC) 10 MG tablet Take 1 tablet (10 mg total) by mouth daily. Patient taking differently: Take 10 mg by mouth daily as needed for allergies.  07/05/17  Yes Mayo, Pete Pelt, MD  esomeprazole (NEXIUM) 40 MG packet Take 40 mg by mouth daily before breakfast. Patient taking differently: Take 40 mg by mouth daily as needed (reflux).  07/05/17  Yes Mayo, Pete Pelt, MD  fluticasone [facility]) 50 MCG/ACT nasal spray Place 2 sprays into both nostrils daily. Patient taking differently: Place 2 sprays into both nostrils daily as needed for allergies.  07/05/17  Yes Mayo, Pete Pelt, MD  glucose blood [facility] VERIO) test strip Use as instructed 07/05/17  Yes Mayo, Pete Pelt, MD  ibuprofen (ADVIL,MOTRIN) 200 MG tablet Take 2 tablets (400 mg total) by mouth every 6 (six) hours as needed (pain). 10/07/15  Yes Mesner, Corene Cornea, MD  Insulin Glargine (LANTUS SOLOSTAR) 100 UNIT/ML Solostar Pen Inject 10 Units into the skin daily at 10 pm. 08/08/17  Yes Mayo, Pete Pelt, MD  Insulin Pen Needle 31G X 5 MM MISC Check blood sugar once daily 08/08/17  Yes Mayo, Pete Pelt, MD  Lancet Devices (ONE TOUCH DELICA LANCING DEV) MISC Check blood sugar once daily 07/05/17  Yes Mayo, Pete Pelt, MD  nitroGLYCERIN (NITROSTAT) 0.4 MG SL tablet Place 1 tablet (0.4 mg total) under the tongue every 5 (five) minutes as needed. If chest pain not relieved by  third tab call 911. 11/24/14  Yes Olam Idler, MD  [facility] LANCETS 53Z MISC Check blood sugar once daily 07/05/17  Yes Mayo, Pete Pelt, MD  polyethylene glycol powder (GLYCOLAX/MIRALAX) powder Take 255 g by mouth daily. Patient taking differently: Take 1 Container by mouth daily as needed for mild constipation or moderate constipation.  08/08/17  Yes Mayo, Pete Pelt, MD  quinapril-hydrochlorothiazide (ACCURETIC) 20-12.5 MG tablet take 2 tablets by mouth once daily 10/05/16  Yes Mayo, Pete Pelt, MD  rosuvastatin (CRESTOR) 20 MG tablet Take 1 tablet (20 mg total) daily by mouth. 02/27/17  Yes Mayo, Pete Pelt, MD  triamcinolone cream (KENALOG)  0.1 % Apply 1 application topically 2 (two) times daily. Patient taking differently: Apply 1 application topically 2 (two) times daily as needed (rash).  08/11/17  Yes Mayo, Pete Pelt, MD  baclofen (LIORESAL) 10 MG tablet Take 1 tablet (10 mg total) by mouth 3 (three) times daily. Patient not taking: Reported on 08/19/2017 01/03/17   Mayo, Pete Pelt, MD  clotrimazole (LOTRIMIN) 1 % cream Apply 1 application topically 2 (two) times daily. Patient not taking: Reported on 08/19/2017 01/03/17   Mayo, Pete Pelt, MD  sennosides-docusate sodium (SENOKOT-S) 8.6-50 MG tablet Take 2 tablets by mouth 2 (two) times daily. Patient not taking: Reported on 08/19/2017 08/08/17   Mayo, Pete Pelt, MD  Triamcinolone Acetonide (TRIAMCINOLONE 0.1 % CREAM : EUCERIN) CREA Apply 1 application topically 2 (two) times daily as needed for itching. Patient not taking: Reported on 08/19/2017 08/11/17   Mayo, Pete Pelt, MD    Current Facility-Administered Medications  Medication Dose Route Frequency Provider Last Rate Last Dose  . acetaminophen (TYLENOL) tablet 650 mg  650 mg Oral Q6H PRN Jani Gravel, MD       Or  . acetaminophen (TYLENOL) suppository 650 mg  650 mg Rectal Q6H PRN Jani Gravel, MD      . aspirin chewable tablet 81 mg  81 mg Oral Daily Hosie Poisson, MD   81 mg at 08/22/17  0950  . esomeprazole (NEXIUM) capsule 40 mg  40 mg Oral BID AC Hosie Poisson, MD   40 mg at 08/22/17 0804  . heparin injection 5,000 Units  5,000 Units Subcutaneous Q8H Jani Gravel, MD   5,000 Units at 08/22/17 0602  . insulin aspart (novoLOG) injection 0-5 Units  0-5 Units Subcutaneous QHS Hosie Poisson, MD   2 Units at 08/21/17 2225  . insulin aspart (novoLOG) injection 0-9 Units  0-9 Units Subcutaneous TID WC Hosie Poisson, MD   2 Units at 08/22/17 0804  . insulin aspart (novoLOG) injection 3 Units  3 Units Subcutaneous TID WC Hosie Poisson, MD   3 Units at 08/22/17 0804  . insulin glargine (LANTUS) injection 10 Units  10 Units Subcutaneous Q2200 Hosie Poisson, MD   10 Units at 08/21/17 2225  . ondansetron (ZOFRAN) injection 4 mg  4 mg Intravenous Q6H PRN Lovey Newcomer T, NP   4 mg at 08/21/17 0507  . polyethylene glycol (MIRALAX / GLYCOLAX) packet 17 g  17 g Oral Daily PRN Hosie Poisson, MD      . promethazine (PHENERGAN) injection 12.5 mg  12.5 mg Intravenous Q6H PRN Hosie Poisson, MD      . rosuvastatin (CRESTOR) tablet 20 mg  20 mg Oral q1800 Hosie Poisson, MD   20 mg at 08/21/17 1846  . traMADol (ULTRAM) tablet 100 mg  100 mg Oral Q6H PRN Hosie Poisson, MD   100 mg at 08/20/17 2304    Allergies as of 08/19/2017 - Review Complete 08/19/2017  Allergen Reaction Noted  . Metformin and related Diarrhea 11/24/2014     Review of Systems:    Constitutional: No weight loss, fever or chills Skin: No rash  Cardiovascular: No chest pain Respiratory: No SOB  Gastrointestinal: See HPI and otherwise negative Genitourinary: No dysuria  Neurological: No headache Musculoskeletal: No new muscle or joint pain Hematologic: No bleeding  Psychiatric: No history of depression or anxiety   Physical Exam:  Vital signs in last 24 hours: Temp:  [98.1 F (36.7 C)-98.5 F (36.9 C)] 98.5 F (36.9 C) (05/14 0452) Pulse Rate:  [71-79] 78 (05/14  4496) Resp:  [18] 18 (05/14 0452) BP: (109-140)/(54-77)  140/54 (05/14 0452) SpO2:  [95 %-100 %] 100 % (05/14 0452)   General:   Pleasant Elderly AA female appears to be in NAD, Well developed, Well nourished, alert and cooperative Head:  Normocephalic and atraumatic. Eyes:   PEERL, EOMI. No icterus. Conjunctiva pink. Ears:  Normal auditory acuity. Neck:  Supple Throat: Oral cavity and pharynx without inflammation, swelling or lesion.  Lungs: Respirations even and unlabored. Lungs clear to auscultation bilaterally.   No wheezes, crackles, or rhonchi.  Heart: Normal S1, S2. No MRG. Regular rate and rhythm. No peripheral edema, cyanosis or pallor.  Abdomen:  Soft, nondistended, mild epigastric ttp, No rebound or guarding. Normal bowel sounds. No appreciable masses or hepatomegaly. Rectal:  Not performed.  Msk:  Symmetrical without gross deformities.   Extremities:  Without edema, no deformity or joint abnormality.  Neurologic:  Alert and  oriented x4;  grossly normal neurologically.  Skin:   Dry and intact without significant lesions or rashes. Psychiatric: Demonstrates good judgement and reason without abnormal affect or behaviors.   LAB RESULTS: Recent Labs    08/19/17 1908 08/20/17 0754 08/21/17 0600  WBC 8.6 9.2 7.0  HGB 10.3* 9.6* 9.5*  HCT 31.6* 29.1* 29.1*  PLT 238 186 198   BMET Recent Labs    08/20/17 0754 08/21/17 0600 08/21/17 1811  NA 136 142 144  K 3.1* 5.9* 4.5  CL 100* 109 113*  CO2 '22 23 23  '$ GLUCOSE 226* 348* 249*  BUN 35* 35* 29*  CREATININE 1.38*  1.33* 1.40* 1.18*  CALCIUM 8.9 9.3 9.1   LFT Recent Labs    08/21/17 0600  PROT 7.0  ALBUMIN 3.5  AST 16  ALT 16  ALKPHOS 55  BILITOT 0.7   EXAM: CT ABDOMEN AND PELVIS WITHOUT CONTRAST 08/20/17  TECHNIQUE: Multidetector CT imaging of the abdomen and pelvis was performed following the standard protocol without IV contrast.  COMPARISON:  CT of abdomen pelvis dated 12/02/2013  FINDINGS: Evaluation of this exam is limited in the absence of  intravenous contrast.  Lower chest: Minimal bibasilar atelectatic changes. The visualized lung bases are otherwise clear.  No intra-abdominal free air or free fluid.  Hepatobiliary: Choose 1  Pancreas: Unremarkable. No pancreatic ductal dilatation or surrounding inflammatory changes.  Spleen: Normal in size without focal abnormality.  Adrenals/Urinary Tract: Adrenal glands are unremarkable. Kidneys are normal, without renal calculi, focal lesion, or hydronephrosis. Bladder is unremarkable.  Stomach/Bowel: Postprocedural changes of Nissen fundoplication. There is thickening of the proximal stomach which may be related to underdistention or represent gastritis. Clinical correlation is recommended. No bowel obstruction. Postsurgical changes of partial right hemicolectomy. Appendectomy.  Vascular/Lymphatic: There is moderate aortoiliac atherosclerotic disease. No portal venous gas. There is no adenopathy.  Reproductive: Hysterectomy.  No pelvic mass.  Other: None  Musculoskeletal: No acute or significant osseous findings. Stimulator device in the left lower back with wire extending into the right sacral region.  IMPRESSION: 1. Underdistention the stomach versus gastritis. Clinical correlation is recommended. No bowel obstruction. 2.  Aortic Atherosclerosis (ICD10-I70.0).   Electronically Signed   By: Anner Crete M.D.   On: 08/20/2017 03:12   Impression / Plan:   Impression: 1. Epigastric pain/dysphagia: 3 months of food/pills sticking in throat, resulting in epigastric/esophageal pressure, history of stenosis dilated 2017, CT with possible gastritis which may be contributing, history of previous Nissen, recent increase of NSAID use due to sciatica which was likely contributory; most likely repeat  stenosis +/- gastritis 2. GERD: Chronic Nexium 40 mg daily, no reported symptoms of reflux 3. Nausea: Worse with severe constipation 4.  Constipation:  Chronic and worsening, no better with daily stool softeners, would recommend daily MiraLAX regimen  Plan: 1.  Hold Heparin 6 hours before time of procedure 2.  Patient to remain NPO after midnight tonight 3.  Agree with twice daily PPI for now and antiemetics as needed 4.  Scheduled patient for EGD +/- dilation tomorrow with Dr. Ardis Hughs.  Did discuss risk, benefits, limitations and alternatives and patient agrees to proceed. 5.  Increased MiraLAX to twice daily for now.  If this is not helpful going forward can discuss other options in our outpatient clinic including possible Linzess or Amitiza. 6.  Please await any further recommendations from Dr. Ardis Hughs later today.  Thank you for your kind consultation, we will continue to follow.  Lavone Nian Salahuddin Arismendez  08/22/2017, 12:56 PM Pager #: [phone]

## 2017-08-22 NOTE — Evaluation (Signed)
Physical Therapy Evaluation Patient Details Name: [name] MRN: [id number] DOB: 01/26/1946 Today's Date: 08/22/2017   History of Present Illness  72 year old female with non obstructive CAD, hypertension, DM, presents with sciatica of the left side, associated with some sub sternal chest pain, worsens with eating, associated with nausea, vomiting.   Clinical Impression  Pt admitted with above diagnosis. Pt currently with functional limitations due to the deficits listed below (see PT Problem List). Pt with high level balance deficits, encouraged mobility with nursing staff in addition to working with PT; do not think pt will need f/u PT if she continues to progress;  Pt will benefit from skilled PT to increase their independence and safety with mobility to allow discharge to the venue listed below.       Follow Up Recommendations No PT follow up    Equipment Recommendations  None recommended by PT    Recommendations for Other Services       Precautions / Restrictions Precautions Precautions: Fall      Mobility  Bed Mobility Overal bed mobility: Modified Independent                Transfers Overall transfer level: Needs assistance Equipment used: None Transfers: Sit to/from Stand Sit to Stand: Min guard         General transfer comment: for safety  Ambulation/Gait Ambulation/Gait assistance: Min guard;Min assist Ambulation Distance (Feet): 260 Feet Assistive device: None Gait Pattern/deviations: Step-through pattern;Wide base of support;Decreased stride length     General Gait Details: decr heel strike and pushoff; pt with unsteady, guarded gait, requiring min assist for balance initially but improved with distance; unable to tolerate challenges  Stairs            Wheelchair Mobility    Modified Rankin (Stroke Patients Only)       Balance Overall balance assessment: Needs assistance   Sitting balance-Leahy Scale: Good       Standing  balance-Leahy Scale: Fair                 High Level Balance Comments: LOB with head turns             Pertinent Vitals/Pain Pain Assessment: 0-10 Pain Score: 8  Pain Location: back  Pain Descriptors / Indicators: Aching Pain Intervention(s): Limited activity within patient's tolerance;Monitored during session    Home Living Family/patient expects to be discharged to:: Private residence Living Arrangements: Spouse/significant other;Other relatives Available Help at Discharge: Family Type of Home: House Home Access: Stairs to enter   [employer] of Steps: 3 Home Layout: One level Home Equipment: [place] - 2 wheels;Shower seat;Bedside commode      Prior Function Level of Independence: Independent               Hand Dominance        Extremity/Trunk Assessment   Upper Extremity Assessment Upper Extremity Assessment: Overall WFL for tasks assessed    Lower Extremity Assessment Lower Extremity Assessment: RLE deficits/detail RLE Deficits / Details: bil -- LEs--grossly 3+ to 4/5, AROm WFL       Communication   Communication: No difficulties  Cognition Arousal/Alertness: Awake/alert Behavior During Therapy: WFL for tasks assessed/performed Overall Cognitive Status: Within Functional Limits for tasks assessed                                        General Comments  Exercises     Assessment/Plan    PT Assessment Patient needs continued PT services  PT Problem List Decreased strength;Decreased activity tolerance;Decreased mobility;Decreased balance       PT Treatment Interventions Therapeutic activities;Gait training;Patient/family education;Therapeutic exercise;Functional mobility training;Balance training    PT Goals (Current goals can be found in the Care Plan section)  Acute Rehab PT Goals Patient Stated Goal: home soon PT Goal Formulation: With patient Time For Goal Achievement: 08/29/17 Potential to  Achieve Goals: Good    Frequency Min 3X/week   Barriers to discharge        Co-evaluation               AM-PAC PT "6 Clicks" Daily Activity  Outcome Measure Difficulty turning over in bed (including adjusting bedclothes, sheets and blankets)?: None Difficulty moving from lying on back to sitting on the side of the bed? : None Difficulty sitting down on and standing up from a chair with arms (e.g., wheelchair, bedside commode, etc,.)?: A Little Help needed moving to and from a bed to chair (including a wheelchair)?: A Little Help needed walking in hospital room?: A Little Help needed climbing 3-5 steps with a railing? : A Little 6 Click Score: 20    End of Session Equipment Utilized During Treatment: Gait belt Activity Tolerance: Patient tolerated treatment well Patient left: with call bell/phone within reach;in bed;with bed alarm set   PT Visit Diagnosis: Difficulty in walking, not elsewhere classified (R26.2)    Time: 1100-1117 PT Time Calculation (min) (ACUTE ONLY): 17 min   Charges:   PT Evaluation $PT Eval Low Complexity: 1 Low     PT G CodesKenyon [name], PT Pager: [phone] 08/22/2017   [employer] 08/22/2017, 11:32 AM

## 2017-08-22 NOTE — Progress Notes (Signed)
PROGRESS NOTE    [name]  WRU:[id number] DOB: 1945/09/25 DOA: 08/19/2017 PCP: Sela Hua, MD    Brief Narrative: 72 year old female with non obstructive CAD, hypertension, DM, presents with sciatica of the left side, associated with some sub sternal chest pain, worsens with eating, associated with nausea, vomiting.  She was also found to be in acute renal failure.  Assessment & Plan:   Principal Problem:   ARF (acute renal failure) (HCC) Active Problems:   DM (diabetes mellitus), type 2, uncontrolled ([place])   Chest pain   Sciatica of left side   Acute renal failure.  Suspect pre renal in etiology. Creatinine improved with hydration.  Pt has a baseline creatinine between 1.2 to 1.6.  Admitted with a creatinine to 1.8, improving with hydration. It has stabilized to a creatinine of 1.4.  She appears to have some baseline renal insufficiency.  CT doe snot show any hydronephrosis.  UA is negative.      Diabetes Mellitus: uncontrolled with hyperglycemia.  CBG (last 3)  Recent Labs    08/21/17 2214 08/22/17 0751 08/22/17 1245  GLUCAP 237* 174* 200*   Started her back ton lantus 10 units, and added novolog 3 units TIDAC.  Resume SSI HGBA1C IS 11.  Pt appears to be non compliant to meds.    Chest pressure/ substernal pressure. : ACS ruled out.  troponin's are negative. EKG NSR.  Suspect gastritis vs esophagitis.  She continues to have sub sternal discomfort despite being on PPI,'S.  GI consulted for further evaluation.   Left side sciatica:  Tramadol and tylenol for pain.  CT does not show sig stenosis.    Hypertension;  Well controlled.    Hyperkalemia: resolved.     DVT prophylaxis: sq heparin.  Code Status: full code.  Family Communication:none at bedside.  Disposition Plan: pending resolution of the back pain and sub sternal pain.   Consultants:   Gastroenterology.    Procedures: none.    Antimicrobials:none.     Subjective: Reports nauseated, and sub sternal pain. No vomiting.   Objective: Vitals:   08/21/17 0625 08/21/17 1459 08/21/17 2037 08/22/17 0452  BP:  109/77 (!) 131/54 (!) 140/54  Pulse:  79 71 78  Resp:  18 18 18   Temp:  98.5 F (36.9 C) 98.1 F (36.7 C) 98.5 F (36.9 C)  TempSrc:  Oral Oral Oral  SpO2:  95% 99% 100%  Weight: 61.3 kg (135 lb 2.3 oz)     Height:        Intake/Output Summary (Last 24 hours) at 08/22/2017 1305 Last data filed at 08/22/2017 0800 Gross per 24 hour  Intake 1805 ml  Output -  Net 1805 ml   Filed Weights   08/19/17 1859 08/21/17 0625  Weight: 61.2 kg (135 lb) 61.3 kg (135 lb 2.3 oz)    Examination:  General exam: Appears calm and comfortable , not  In distress.  Respiratory system: Clear to auscultation. Respiratory effort normal. No wheezing or rhonchi.  Cardiovascular system: S1 & S2 heard, RRR. No JVD, murmurs,No pedal edema. Gastrointestinal system: Abdomen is soft non tender non distended bowel sounds good.  Central nervous system: Alert and oriented. No focal neurological deficits. Extremities: Symmetric 5 x 5 power. Skin: No rashes, lesions or ulcers Psychiatry: . Mood & affect appropriate.     Data Reviewed: I have personally reviewed following labs and imaging studies  CBC: Recent Labs  Lab 08/19/17 1908 08/20/17 0754 08/21/17 0600  WBC 8.6  9.2 7.0  HGB 10.3* 9.6* 9.5*  HCT 31.6* 29.1* 29.1*  MCV 85.6 84.8 85.3  PLT 238 186 170   Basic Metabolic Panel: Recent Labs  Lab 08/19/17 1908 08/20/17 0754 08/21/17 0600 08/21/17 1811  NA 135 136 142 144  K 3.2* 3.1* 5.9* 4.5  CL 97* 100* 109 113*  CO2 24 22 23 23   GLUCOSE 533* 226* 348* 249*  BUN 41* 35* 35* 29*  CREATININE 1.82* 1.38*  1.33* 1.40* 1.18*  CALCIUM 9.6 8.9 9.3 9.1   GFR: Estimated Creatinine Clearance: 38.3 mL/min (A) (by C-G formula based on SCr of 1.18 mg/dL (H)). Liver Function Tests: Recent Labs  Lab 08/21/17 0600  AST 16  ALT 16   ALKPHOS 55  BILITOT 0.7  PROT 7.0  ALBUMIN 3.5   No results for input(s): LIPASE, AMYLASE in the last 168 hours. No results for input(s): AMMONIA in the last 168 hours. Coagulation Profile: No results for input(s): INR, PROTIME in the last 168 hours. Cardiac Enzymes: Recent Labs  Lab 08/20/17 0754 08/20/17 1344 08/20/17 2003  CKTOTAL 125  --   --   TROPONINI <0.03 <0.03 <0.03   BNP (last 3 results) No results for input(s): PROBNP in the last 8760 hours. HbA1C: Recent Labs    08/21/17 0600  HGBA1C 11.0*   CBG: Recent Labs  Lab 08/21/17 1155 08/21/17 1605 08/21/17 2214 08/22/17 0751 08/22/17 1245  GLUCAP 250* 254* 237* 174* 200*   Lipid Profile: No results for input(s): CHOL, HDL, LDLCALC, TRIG, CHOLHDL, LDLDIRECT in the last 72 hours. Thyroid Function Tests: No results for input(s): TSH, T4TOTAL, FREET4, T3FREE, THYROIDAB in the last 72 hours. Anemia Panel: No results for input(s): VITAMINB12, FOLATE, FERRITIN, TIBC, IRON, RETICCTPCT in the last 72 hours. Sepsis Labs: No results for input(s): PROCALCITON, LATICACIDVEN in the last 168 hours.  No results found for this or any previous visit (from the past 240 hour(s)).       Radiology Studies: No results found.      Scheduled Meds: . aspirin  81 mg Oral Daily  . esomeprazole  40 mg Oral BID AC  . heparin  5,000 Units Subcutaneous Q8H  . insulin aspart  0-5 Units Subcutaneous QHS  . insulin aspart  0-9 Units Subcutaneous TID WC  . insulin aspart  3 Units Subcutaneous TID WC  . insulin glargine  10 Units Subcutaneous Q2200  . rosuvastatin  20 mg Oral q1800   Continuous Infusions:    LOS: 2 days    Time spent: 35 minutes    Hosie Poisson, MD [facility] Pager [phone]  If 7PM-7AM, please contact night-coverage [email or web address] Password [email or web address] 08/22/2017, 1:05 PM

## 2017-08-22 NOTE — H&P (View-Only) (Signed)
Consultation  Referring Provider: Dr. Karleen Hampshire     Primary Care Physician:  Mayo, Pete Pelt, MD Primary Gastroenterologist:  Dr. Carlean Purl       Reason for Consultation:  Substernal c/p, nausea, dysphagia             HPI:   [name] is a 72 y.o. female with past medical history as listed below including colon cancer, status post right colectomy (08/24/2010), who presented to the ER on 08/20/2017 with a complaint of back pain with radiation into bilateral legs.  Also describes sharp chest pain in the left breast lasting a few minutes, worse with movement and reproducible with palpation which started 08/19/2017.  We are consulted today in regards to ongoing substernal chest pain and nausea of noncardiac origin.    Today, patient be noted that portion patient is a poor historian, she explains that she is having an epigastric pressure which is worse when she eats.  She tells me that food and pills have been sticking in her throat for the past 3 to 4 months "just like they were before".  Describes having her throat stretched in the past.  Explained she does not have trouble with heartburn or reflux symptoms but has a constant epigastric pressure rated as a 2-3/10 worse with eating.  She has been using some NSAIDs for sciatic pain recently.  She is on Nexium 40 mg daily at home.  History of Niesen fundoplication.    Also describes history of worsening constipation.  Review of chart and patient she has been seeing her primary care physician in regards to this and has been on a regimen of MiraLAX every 3 to 4 days, daily stool softeners and occasional Senokot.  Patient describes that she can go sometimes a week in between a bowel movement and typically this is when her nausea is worse.    Denies fever, chills, blood in stool, weight loss, anorexia or vomiting.  Hospital course: CT abdomen and pelvis with underdistention in the stomach versus gastritis, no bowel obstruction and aortic atherosclerosis.   Admitted with acute renal failure, this is improving with hydration creatinine is stabilized to 1.4.  Some baseline renal insufficiency.  No improvement with twice daily PPI, IV Pepcid and GI cocktail.  Left-sided sciatica being treated with tramadol and Tylenol.  CT does not show significant stenosis.  Previous GI history: 07/14/2016 colonoscopy Dr. Carlean Purl: End-to-side ileocolonic anastomosis characterized by healthy-appearing mucosa, otherwise normal exam.  Repeat recommended in 5 years. 09/10/2015 EGD Dr. Carlean Purl: Esophageal stenosis the GE junction, mild, dilated to 20 mm, Nissen fundoplication was found with an intact wrap, otherwise normal exam (Further records in chart)  Past Medical History:  Diagnosis Date  . Allergy   . Asthma   . Blood transfusion without reported diagnosis 12/23/2004   with colon cancer  . Brain tumor (benign) ([place])   . Colon cancer [facility]) Nov. 27,  2006  . Diabetes mellitus    Type 2  . Female bladder prolapse   . Gastritis   . GERD (gastroesophageal reflux disease)   . Glaucoma   . Hypercholesterolemia   . Hypertension     Past Surgical History:  Procedure Laterality Date  . ABDOMINAL HYSTERECTOMY  1976  . bladder tac    . CARDIAC CATHETERIZATION  2005  . CATARACT EXTRACTION, BILATERAL    . COLONOSCOPY W/ BIOPSIES    . ESOPHAGEAL MANOMETRY N/A 03/20/2015   Procedure: ESOPHAGEAL MANOMETRY (EM);  Surgeon: Gatha Mayer,  MD;  Location: [facility] ENDOSCOPY;  Service: Endoscopy;  Laterality: N/A;  . EYE SURGERY     mole removed left eye  . LAPAROSCOPIC NISSEN FUNDOPLICATION  8413  . repair prolapsedbladder    . RIGHT COLECTOMY  Nov. 27, 2006  . TMJ ARTHROPLASTY    . UPPER GASTROINTESTINAL ENDOSCOPY      Family History  Problem Relation Age of Onset  . Lymphoma Other   . Bone cancer Father   . Diabetes Daughter   . Hypertension Daughter   . Heart attack Daughter   . Suicidality Son        deceased  . Colon cancer Neg Hx   . Stomach cancer Neg Hx        Social History   Tobacco Use  . Smoking status: Never Smoker  . Smokeless tobacco: Never Used  Substance Use Topics  . Alcohol use: No  . Drug use: No    Prior to Admission medications   Medication Sig Start Date End Date Taking? Authorizing Provider  amLODipine (NORVASC) 2.5 MG tablet take 1 tablet by mouth once daily 05/22/17  Yes Mayo, Pete Pelt, MD  aspirin 81 MG chewable tablet Chew 81 mg by mouth daily.     Yes [provider]  Blood Glucose Monitoring Suppl (ONETOUCH VERIO) w/Device KIT 1 each by Does not apply route daily. 07/05/17  Yes Mayo, Pete Pelt, MD  cetirizine (ZYRTEC) 10 MG tablet Take 1 tablet (10 mg total) by mouth daily. Patient taking differently: Take 10 mg by mouth daily as needed for allergies.  07/05/17  Yes Mayo, Pete Pelt, MD  esomeprazole (NEXIUM) 40 MG packet Take 40 mg by mouth daily before breakfast. Patient taking differently: Take 40 mg by mouth daily as needed (reflux).  07/05/17  Yes Mayo, Pete Pelt, MD  fluticasone [facility]) 50 MCG/ACT nasal spray Place 2 sprays into both nostrils daily. Patient taking differently: Place 2 sprays into both nostrils daily as needed for allergies.  07/05/17  Yes Mayo, Pete Pelt, MD  glucose blood [facility] VERIO) test strip Use as instructed 07/05/17  Yes Mayo, Pete Pelt, MD  ibuprofen (ADVIL,MOTRIN) 200 MG tablet Take 2 tablets (400 mg total) by mouth every 6 (six) hours as needed (pain). 10/07/15  Yes Mesner, Corene Cornea, MD  Insulin Glargine (LANTUS SOLOSTAR) 100 UNIT/ML Solostar Pen Inject 10 Units into the skin daily at 10 pm. 08/08/17  Yes Mayo, Pete Pelt, MD  Insulin Pen Needle 31G X 5 MM MISC Check blood sugar once daily 08/08/17  Yes Mayo, Pete Pelt, MD  Lancet Devices (ONE TOUCH DELICA LANCING DEV) MISC Check blood sugar once daily 07/05/17  Yes Mayo, Pete Pelt, MD  nitroGLYCERIN (NITROSTAT) 0.4 MG SL tablet Place 1 tablet (0.4 mg total) under the tongue every 5 (five) minutes as needed. If chest pain not relieved by  third tab call 911. 11/24/14  Yes Olam Idler, MD  [facility] LANCETS 24M MISC Check blood sugar once daily 07/05/17  Yes Mayo, Pete Pelt, MD  polyethylene glycol powder (GLYCOLAX/MIRALAX) powder Take 255 g by mouth daily. Patient taking differently: Take 1 Container by mouth daily as needed for mild constipation or moderate constipation.  08/08/17  Yes Mayo, Pete Pelt, MD  quinapril-hydrochlorothiazide (ACCURETIC) 20-12.5 MG tablet take 2 tablets by mouth once daily 10/05/16  Yes Mayo, Pete Pelt, MD  rosuvastatin (CRESTOR) 20 MG tablet Take 1 tablet (20 mg total) daily by mouth. 02/27/17  Yes Mayo, Pete Pelt, MD  triamcinolone cream (KENALOG)  0.1 % Apply 1 application topically 2 (two) times daily. Patient taking differently: Apply 1 application topically 2 (two) times daily as needed (rash).  08/11/17  Yes Mayo, Pete Pelt, MD  baclofen (LIORESAL) 10 MG tablet Take 1 tablet (10 mg total) by mouth 3 (three) times daily. Patient not taking: Reported on 08/19/2017 01/03/17   Mayo, Pete Pelt, MD  clotrimazole (LOTRIMIN) 1 % cream Apply 1 application topically 2 (two) times daily. Patient not taking: Reported on 08/19/2017 01/03/17   Mayo, Pete Pelt, MD  sennosides-docusate sodium (SENOKOT-S) 8.6-50 MG tablet Take 2 tablets by mouth 2 (two) times daily. Patient not taking: Reported on 08/19/2017 08/08/17   Mayo, Pete Pelt, MD  Triamcinolone Acetonide (TRIAMCINOLONE 0.1 % CREAM : EUCERIN) CREA Apply 1 application topically 2 (two) times daily as needed for itching. Patient not taking: Reported on 08/19/2017 08/11/17   Mayo, Pete Pelt, MD    Current Facility-Administered Medications  Medication Dose Route Frequency Provider Last Rate Last Dose  . acetaminophen (TYLENOL) tablet 650 mg  650 mg Oral Q6H PRN Jani Gravel, MD       Or  . acetaminophen (TYLENOL) suppository 650 mg  650 mg Rectal Q6H PRN Jani Gravel, MD      . aspirin chewable tablet 81 mg  81 mg Oral Daily Hosie Poisson, MD   81 mg at 08/22/17  0950  . esomeprazole (NEXIUM) capsule 40 mg  40 mg Oral BID AC Hosie Poisson, MD   40 mg at 08/22/17 0804  . heparin injection 5,000 Units  5,000 Units Subcutaneous Q8H Jani Gravel, MD   5,000 Units at 08/22/17 0602  . insulin aspart (novoLOG) injection 0-5 Units  0-5 Units Subcutaneous QHS Hosie Poisson, MD   2 Units at 08/21/17 2225  . insulin aspart (novoLOG) injection 0-9 Units  0-9 Units Subcutaneous TID WC Hosie Poisson, MD   2 Units at 08/22/17 0804  . insulin aspart (novoLOG) injection 3 Units  3 Units Subcutaneous TID WC Hosie Poisson, MD   3 Units at 08/22/17 0804  . insulin glargine (LANTUS) injection 10 Units  10 Units Subcutaneous Q2200 Hosie Poisson, MD   10 Units at 08/21/17 2225  . ondansetron (ZOFRAN) injection 4 mg  4 mg Intravenous Q6H PRN Lovey Newcomer T, NP   4 mg at 08/21/17 0507  . polyethylene glycol (MIRALAX / GLYCOLAX) packet 17 g  17 g Oral Daily PRN Hosie Poisson, MD      . promethazine (PHENERGAN) injection 12.5 mg  12.5 mg Intravenous Q6H PRN Hosie Poisson, MD      . rosuvastatin (CRESTOR) tablet 20 mg  20 mg Oral q1800 Hosie Poisson, MD   20 mg at 08/21/17 1846  . traMADol (ULTRAM) tablet 100 mg  100 mg Oral Q6H PRN Hosie Poisson, MD   100 mg at 08/20/17 2304    Allergies as of 08/19/2017 - Review Complete 08/19/2017  Allergen Reaction Noted  . Metformin and related Diarrhea 11/24/2014     Review of Systems:    Constitutional: No weight loss, fever or chills Skin: No rash  Cardiovascular: No chest pain Respiratory: No SOB  Gastrointestinal: See HPI and otherwise negative Genitourinary: No dysuria  Neurological: No headache Musculoskeletal: No new muscle or joint pain Hematologic: No bleeding  Psychiatric: No history of depression or anxiety   Physical Exam:  Vital signs in last 24 hours: Temp:  [98.1 F (36.7 C)-98.5 F (36.9 C)] 98.5 F (36.9 C) (05/14 0452) Pulse Rate:  [71-79] 78 (05/14  5329) Resp:  [18] 18 (05/14 0452) BP: (109-140)/(54-77)  140/54 (05/14 0452) SpO2:  [95 %-100 %] 100 % (05/14 0452)   General:   Pleasant Elderly AA female appears to be in NAD, Well developed, Well nourished, alert and cooperative Head:  Normocephalic and atraumatic. Eyes:   PEERL, EOMI. No icterus. Conjunctiva pink. Ears:  Normal auditory acuity. Neck:  Supple Throat: Oral cavity and pharynx without inflammation, swelling or lesion.  Lungs: Respirations even and unlabored. Lungs clear to auscultation bilaterally.   No wheezes, crackles, or rhonchi.  Heart: Normal S1, S2. No MRG. Regular rate and rhythm. No peripheral edema, cyanosis or pallor.  Abdomen:  Soft, nondistended, mild epigastric ttp, No rebound or guarding. Normal bowel sounds. No appreciable masses or hepatomegaly. Rectal:  Not performed.  Msk:  Symmetrical without gross deformities.   Extremities:  Without edema, no deformity or joint abnormality.  Neurologic:  Alert and  oriented x4;  grossly normal neurologically.  Skin:   Dry and intact without significant lesions or rashes. Psychiatric: Demonstrates good judgement and reason without abnormal affect or behaviors.   LAB RESULTS: Recent Labs    08/19/17 1908 08/20/17 0754 08/21/17 0600  WBC 8.6 9.2 7.0  HGB 10.3* 9.6* 9.5*  HCT 31.6* 29.1* 29.1*  PLT 238 186 198   BMET Recent Labs    08/20/17 0754 08/21/17 0600 08/21/17 1811  NA 136 142 144  K 3.1* 5.9* 4.5  CL 100* 109 113*  CO2 '22 23 23  '$ GLUCOSE 226* 348* 249*  BUN 35* 35* 29*  CREATININE 1.38*  1.33* 1.40* 1.18*  CALCIUM 8.9 9.3 9.1   LFT Recent Labs    08/21/17 0600  PROT 7.0  ALBUMIN 3.5  AST 16  ALT 16  ALKPHOS 55  BILITOT 0.7   EXAM: CT ABDOMEN AND PELVIS WITHOUT CONTRAST 08/20/17  TECHNIQUE: Multidetector CT imaging of the abdomen and pelvis was performed following the standard protocol without IV contrast.  COMPARISON:  CT of abdomen pelvis dated 12/02/2013  FINDINGS: Evaluation of this exam is limited in the absence of  intravenous contrast.  Lower chest: Minimal bibasilar atelectatic changes. The visualized lung bases are otherwise clear.  No intra-abdominal free air or free fluid.  Hepatobiliary: Choose 1  Pancreas: Unremarkable. No pancreatic ductal dilatation or surrounding inflammatory changes.  Spleen: Normal in size without focal abnormality.  Adrenals/Urinary Tract: Adrenal glands are unremarkable. Kidneys are normal, without renal calculi, focal lesion, or hydronephrosis. Bladder is unremarkable.  Stomach/Bowel: Postprocedural changes of Nissen fundoplication. There is thickening of the proximal stomach which may be related to underdistention or represent gastritis. Clinical correlation is recommended. No bowel obstruction. Postsurgical changes of partial right hemicolectomy. Appendectomy.  Vascular/Lymphatic: There is moderate aortoiliac atherosclerotic disease. No portal venous gas. There is no adenopathy.  Reproductive: Hysterectomy.  No pelvic mass.  Other: None  Musculoskeletal: No acute or significant osseous findings. Stimulator device in the left lower back with wire extending into the right sacral region.  IMPRESSION: 1. Underdistention the stomach versus gastritis. Clinical correlation is recommended. No bowel obstruction. 2.  Aortic Atherosclerosis (ICD10-I70.0).   Electronically Signed   By: Anner Crete M.D.   On: 08/20/2017 03:12   Impression / Plan:   Impression: 1. Epigastric pain/dysphagia: 3 months of food/pills sticking in throat, resulting in epigastric/esophageal pressure, history of stenosis dilated 2017, CT with possible gastritis which may be contributing, history of previous Nissen, recent increase of NSAID use due to sciatica which was likely contributory; most likely repeat  stenosis +/- gastritis 2. GERD: Chronic Nexium 40 mg daily, no reported symptoms of reflux 3. Nausea: Worse with severe constipation 4.  Constipation:  Chronic and worsening, no better with daily stool softeners, would recommend daily MiraLAX regimen  Plan: 1.  Hold Heparin 6 hours before time of procedure 2.  Patient to remain NPO after midnight tonight 3.  Agree with twice daily PPI for now and antiemetics as needed 4.  Scheduled patient for EGD +/- dilation tomorrow with Dr. Ardis Hughs.  Did discuss risk, benefits, limitations and alternatives and patient agrees to proceed. 5.  Increased MiraLAX to twice daily for now.  If this is not helpful going forward can discuss other options in our outpatient clinic including possible Linzess or Amitiza. 6.  Please await any further recommendations from Dr. Ardis Hughs later today.  Thank you for your kind consultation, we will continue to follow.  Lavone Nian Priyanka Causey  08/22/2017, 12:56 PM Pager #: [phone]

## 2017-08-23 ENCOUNTER — Encounter (HOSPITAL_COMMUNITY)
Admission: EM | Disposition: A | Discharge status: Home / Self Care | Payer: Self-pay | Source: Home / Self Care | Attending: Internal Medicine

## 2017-08-23 ENCOUNTER — Inpatient Hospital Stay (HOSPITAL_COMMUNITY): Payer: Medicare Other | Admitting: Registered Nurse

## 2017-08-23 ENCOUNTER — Encounter (HOSPITAL_COMMUNITY): Payer: Self-pay | Admitting: *Deleted

## 2017-08-23 DIAGNOSIS — K222 Esophageal obstruction: Secondary | ICD-10-CM

## 2017-08-23 DIAGNOSIS — E875 Hyperkalemia: Secondary | ICD-10-CM

## 2017-08-23 DIAGNOSIS — R1013 Epigastric pain: Secondary | ICD-10-CM

## 2017-08-23 DIAGNOSIS — D649 Anemia, unspecified: Secondary | ICD-10-CM

## 2017-08-23 HISTORY — PX: BALLOON DILATION: SHX5330

## 2017-08-23 HISTORY — PX: ESOPHAGOGASTRODUODENOSCOPY (EGD) WITH PROPOFOL: SHX5813

## 2017-08-23 LAB — GLUCOSE, CAPILLARY
Glucose-Capillary: 121 mg/dL — ABNORMAL HIGH (ref 65–99)
Glucose-Capillary: 72 mg/dL (ref 65–99)
Glucose-Capillary: 199 mg/dL — ABNORMAL HIGH (ref 65–99)
Glucose-Capillary: 234 mg/dL — ABNORMAL HIGH (ref 65–99)
Glucose-Capillary: 253 mg/dL — ABNORMAL HIGH (ref 65–99)

## 2017-08-23 LAB — CBC
HCT: 31.5 % — ABNORMAL LOW (ref 36.0–46.0)
Hemoglobin: 10.4 g/dL — ABNORMAL LOW (ref 12.0–15.0)
MCH: 28.1 pg (ref 26.0–34.0)
MCHC: 33 g/dL (ref 30.0–36.0)
MCV: 85.1 fL (ref 78.0–100.0)
Platelets: 224 10*3/uL (ref 150–400)
RBC: 3.7 MIL/uL — ABNORMAL LOW (ref 3.87–5.11)
RDW: 12.7 % (ref 11.5–15.5)
WBC: 9.4 10*3/uL (ref 4.0–10.5)

## 2017-08-23 SURGERY — ESOPHAGOGASTRODUODENOSCOPY (EGD) WITH PROPOFOL
Anesthesia: Monitor Anesthesia Care

## 2017-08-23 MED ORDER — PROPOFOL 500 MG/50ML IV EMUL
INTRAVENOUS | Status: DC | PRN
  Administered 2017-08-23: 120 ug/kg/min via INTRAVENOUS

## 2017-08-23 MED ORDER — OXYCODONE HCL 5 MG/5ML PO SOLN: 5.0000 mg | Freq: Once | ORAL | Status: DC | PRN

## 2017-08-23 MED ORDER — OXYCODONE HCL 5 MG PO TABS: 5.0000 mg | ORAL_TABLET | Freq: Once | ORAL | Status: DC | PRN

## 2017-08-23 MED ORDER — PROPOFOL 10 MG/ML IV BOLUS
INTRAVENOUS | Status: DC | PRN
  Administered 2017-08-23 (×2): 20 mg via INTRAVENOUS

## 2017-08-23 MED ORDER — LIDOCAINE 2% (20 MG/ML) 5 ML SYRINGE
INTRAMUSCULAR | Status: DC | PRN
  Administered 2017-08-23: 50 mg via INTRAVENOUS

## 2017-08-23 MED ORDER — LACTATED RINGERS IV SOLN
INTRAVENOUS | Status: DC
  Administered 2017-08-23: 1000 mL via INTRAVENOUS

## 2017-08-23 MED ORDER — PROPOFOL 10 MG/ML IV BOLUS
INTRAVENOUS | Status: AC
Start: 2017-08-23 — End: ?
  Filled 2017-08-23: qty 40

## 2017-08-23 SURGICAL SUPPLY — 15 items

## 2017-08-23 NOTE — Interval H&P Note (Signed)
History and Physical Interval Note:  08/23/2017 10:36 AM  [name]  has presented today for surgery, with the diagnosis of Dysphagia  The various methods of treatment have been discussed with the patient and family. After consideration of risks, benefits and other options for treatment, the patient has consented to  Procedure(s) with comments: ESOPHAGOGASTRODUODENOSCOPY (EGD) WITH PROPOFOL (N/A) - With possible dilation as a surgical intervention .  The patient's history has been reviewed, patient examined, no change in status, stable for surgery.  I have reviewed the patient's chart and labs.  Questions were answered to the patient's satisfaction.     Milus Banister

## 2017-08-23 NOTE — Progress Notes (Signed)
Patient went to EGD at 1000.

## 2017-08-23 NOTE — Anesthesia Preprocedure Evaluation (Addendum)
Anesthesia Evaluation  Patient identified by MRN, date of birth, ID band Patient awake    Reviewed: Allergy & Precautions, NPO status , Patient's Chart, lab work & pertinent test results  Airway Mallampati: II  TM Distance: >3 FB Neck ROM: Full    Dental  (+) Partial Lower, Partial Upper   Pulmonary    breath sounds clear to auscultation       Cardiovascular hypertension,  Rhythm:Regular Rate:Normal     Neuro/Psych    GI/Hepatic   Endo/Other  diabetes  Renal/GU      Musculoskeletal   Abdominal   Peds  Hematology   Anesthesia Other Findings   Reproductive/Obstetrics                            Anesthesia Physical Anesthesia Plan  ASA: III  Anesthesia Plan: General and MAC   Post-op Pain Management:    Induction:   PONV Risk Score and Plan: Propofol infusion  Airway Management Planned:   Additional Equipment:   Intra-op Plan:   Post-operative Plan:   Informed Consent: I have reviewed the patients History and Physical, chart, labs and discussed the procedure including the risks, benefits and alternatives for the proposed anesthesia with the patient or authorized representative who has indicated his/her understanding and acceptance.     Plan Discussed with: CRNA and Anesthesiologist  Anesthesia Plan Comments:         Anesthesia Quick Evaluation

## 2017-08-23 NOTE — Anesthesia Postprocedure Evaluation (Signed)
Anesthesia Post Note  Patient: [name]  Procedure(s) Performed: ESOPHAGOGASTRODUODENOSCOPY (EGD) WITH PROPOFOL (N/A ) BALLOON DILATION (N/A )     Patient location during evaluation: PACU Anesthesia Type: MAC Level of consciousness: awake and alert Pain management: pain level controlled Vital Signs Assessment: post-procedure vital signs reviewed and stable Respiratory status: spontaneous breathing, nonlabored ventilation, respiratory function stable and patient connected to nasal cannula oxygen Cardiovascular status: stable and blood pressure returned to baseline Postop Assessment: no apparent nausea or vomiting Anesthetic complications: no    Last Vitals:  Vitals:   08/23/17 1130 08/23/17 1140  BP: (!) 80/48 (!) 112/53  Pulse: 62 61  Resp: (!) 21 18  Temp:    SpO2: 98% 97%    Last Pain:  Vitals:   08/23/17 1140  TempSrc:   PainSc: 0-No pain                 Maeola Mchaney COKER

## 2017-08-23 NOTE — Progress Notes (Signed)
PROGRESS NOTE    [name]  AVW:[id number] DOB: [birth year] DOA: 08/19/2017 PCP: Sela Hua, MD   Brief Narrative:  HPI on 5/12/219 by Dr. Jani Gravel [name]  is a 72 y.o. female, w h/o colon cancer, s/p R colectomy (08/24/2010), Dm2, hypertension, hyperlipidemia, CAD s/p cardiac cath (05/09/2001) => 20% LM, 50% mid LAD, 20%, AV circumflex, 20-30% mid RCA who presents with c/o back pain with radiation down bilateral legs.  Denies numbness, tingling, weakness, incontinence.  Pt states worse over the past few days.  Review of prior CT scan lumbar spine showed disk herniation   Pt also notes sharp chest pain (under the left breast) lasting a few minutes, , worse with movement , reproducible with palpation.  Starting yesterday. Prior ETT 2015 negative.   Pt denies fever, chills, cough, palp, sob, n/v, heartburn, diarrhea, brbpr, black stool.   Interim history Admitted for AKI. Found to have chest like pain, but pain with swallowing.  Patient has had dilatation in the past.  Gastroenterology consulted and appreciated, plan for EGD today.  Assessment & Plan   Acute kidney injury on chronic kidney disease, stage III -Creatinine upon admission was 1.82 (Baseline creatinine approximately 1.2-1.6) -Creatinine currently improved down to 1.02 -Continue to monitor BMP  Diabetes mellitus, type II -Seems to be uncontrolled, hemoglobin A1c 11 -Patient states she was on glyburide as an outpatient but that was discontinued and patient was placed on Lantus 10 units daily with no insulin sliding scale -Continue Lantus, insulin sliding scale, NovoLog 3 units 3 times daily with meals, CBG monitoring  Chest pressure/substernal pressure -ACS ruled out, troponins unremarkable and EKG showed normal sinus rhythm -Suspect to be secondary to gastritis versus esophagitis -Echocardiogram: 60 to 82%, grade 1 diastolic dysfunction (normal global longitudinal strain -22.3%)- will discuss with  cardiology  Epigastric pain/dysphasia -Patient has had pain with swallowing pills as well as food for the last several months.  She is also had a history of dilation in 2017. 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  Pending EGD today  Consultants Gastroenterology   Procedures  Echocardiogram  Antibiotics   Anti-infectives (From admission, onward)   None      Subjective:   [name] seen and examined today.  Patient states she is feeling better today although continues to have occasional pain with swallowing.  Denies current chest pain, shortness of breath, abdominal pain, nausea or vomiting, diarrhea or constipation.  Objective:   Vitals:   08/22/17 1511 08/22/17 2017 08/23/17 0448 08/23/17 1020  BP: (!) 149/65 (!) 176/73 139/74 129/60  Pulse: 76 86 81 74  Resp: 16 20 17  (!) 9  Temp: 98 F (36.7 C) 99.3 F (37.4 C) 98.8 F (37.1 C) 98.1 F (36.7 C)  TempSrc: Oral Oral Oral Oral  SpO2: 100% 100% 99% 100%  Weight:      Height:        Intake/Output Summary (Last 24 hours) at 08/23/2017 1043 Last  data filed at 08/23/2017 0800 Gross per 24 hour  Intake 364.67 ml  Output 3 ml  Net 361.67 ml   Filed Weights   08/19/17 1859 08/21/17 0625  Weight: 61.2 kg (135 lb) 61.3 kg (135 lb 2.3 oz)    Exam  General: Well developed, well nourished,  NAD, appears stated age  HEENT: NCAT, mucous membranes moist.   Neck: Supple  Cardiovascular: S1 S2 auscultated, no rubs, murmurs or gallops. Regular rate and rhythm.  Respiratory: Clear to auscultation bilaterally with equal chest rise  Abdomen: Soft, mild epigastric TTP, nondistended, + bowel sounds  Extremities: warm dry without cyanosis clubbing or edema  Neuro: AAOx3, nonfocal  Psych: Normal affect and demeanor with intact judgement and insight   Data Reviewed: I have personally reviewed following labs and imaging studies  CBC: Recent Labs  Lab 08/19/17 1908 08/20/17 0754 08/21/17 0600 08/23/17 0544  WBC 8.6 9.2 7.0 9.4  HGB 10.3* 9.6* 9.5* 10.4*  HCT 31.6* 29.1* 29.1* 31.5*  MCV 85.6 84.8 85.3 85.1  PLT 238 186 198 885   Basic Metabolic Panel: Recent Labs  Lab 08/19/17 1908 08/20/17 0754 08/21/17 0600 08/21/17 1811 08/22/17 1340  NA 135 136 142 144 142  K 3.2* 3.1* 5.9* 4.5 3.9  CL 97* 100* 109 113* 111  CO2 24 22 23 23 23   GLUCOSE 533* 226* 348* 249* 205*  BUN 41* 35* 35* 29* 21*  CREATININE 1.82* 1.38*  1.33* 1.40* 1.18* 1.02*  CALCIUM 9.6 8.9 9.3 9.1 9.2   GFR: Estimated Creatinine Clearance: 44.3 mL/min (A) (by C-G formula based on SCr of 1.02 mg/dL (H)). Liver Function Tests: Recent Labs  Lab 08/21/17 0600  AST 16  ALT 16  ALKPHOS 55  BILITOT 0.7  PROT 7.0  ALBUMIN 3.5   No results for input(s): LIPASE, AMYLASE in the last 168 hours. No results for input(s): AMMONIA in the last 168 hours. Coagulation Profile: No results for input(s): INR, PROTIME in the last 168 hours. Cardiac Enzymes: Recent Labs  Lab 08/20/17 0754 08/20/17 1344 08/20/17 2003  CKTOTAL 125  --   --   TROPONINI <0.03 <0.03 <0.03   BNP (last 3 results) No results for input(s): PROBNP in the last 8760 hours. HbA1C: Recent Labs    08/21/17 0600  HGBA1C 11.0*   CBG: Recent Labs  Lab 08/22/17 0751 08/22/17 1245 08/22/17 1614 08/22/17 2052 08/23/17 0728   GLUCAP 174* 200* 316* 181* 121*   Lipid Profile: No results for input(s): CHOL, HDL, LDLCALC, TRIG, CHOLHDL, LDLDIRECT in the last 72 hours. Thyroid Function Tests: No results for input(s): TSH, T4TOTAL, FREET4, T3FREE, THYROIDAB in the last 72 hours. Anemia Panel: No results for input(s): VITAMINB12, FOLATE, FERRITIN, TIBC, IRON, RETICCTPCT in the last 72 hours. Urine analysis:    Component Value Date/Time   COLORURINE STRAW (A) 08/19/2017 1917   APPEARANCEUR CLEAR 08/19/2017 1917   LABSPEC 1.022 08/19/2017 1917   PHURINE 5.0 08/19/2017 1917   GLUCOSEU >=500 (A) 08/19/2017 1917   HGBUR SMALL (A) 08/19/2017 1917   HGBUR trace-intact 03/01/2010 [place] 08/19/2017 [place] 09/18/2014 1534   KETONESUR NEGATIVE 08/19/2017 1917   PROTEINUR NEGATIVE 08/19/2017 1917   UROBILINOGEN 0.2 09/18/2014 1534   UROBILINOGEN 0.2 03/12/2012 1835   NITRITE NEGATIVE 08/19/2017 1917   LEUKOCYTESUR NEGATIVE 08/19/2017 1917   Sepsis Labs: @LABRCNTIP (procalcitonin:4,lacticidven:4)  )No results found for this or any previous visit (from the past 240 hour(s)).    Radiology Studies: No  results found.   Scheduled Meds: . [MAR Hold] aspirin  81 mg Oral Daily  . [MAR Hold] esomeprazole  40 mg Oral BID AC  . [MAR Hold] heparin  5,000 Units Subcutaneous Q8H  . [MAR Hold] insulin aspart  0-5 Units Subcutaneous QHS  . [MAR Hold] insulin aspart  0-9 Units Subcutaneous TID WC  . [MAR Hold] insulin aspart  3 Units Subcutaneous TID WC  . [MAR Hold] insulin glargine  10 Units Subcutaneous Q2200  . [MAR Hold] polyethylene glycol  17 g Oral BID  . [MAR Hold] rosuvastatin  20 mg Oral q1800   Continuous Infusions: . sodium chloride 20 mL/hr at 08/22/17 1346  . lactated ringers 1,000 mL (08/23/17 1032)     LOS: 3 days   Time Spent in minutes   35 minutes  Ilze Roselli D.O. on 08/23/2017 at 10:43 AM  Between 7am to 7pm - Pager - [phone]  After 7pm go to  [email or web address] - password [email or web address]  And look for the night coverage person covering for me after hours  [facility] Office  [phone]

## 2017-08-23 NOTE — Op Note (Signed)
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 Providers:                Milus Banister, MD, Cleda Daub, RN, Janie                            Billups, Technician, Courtney Heys [place], CRNA Referring MD:              Medicines:                Monitored Anesthesia Care Complications:            No immediate complications. Estimated blood loss:                            None. Estimated Blood Loss:     Estimated blood loss: none. Procedure:                Pre-Anesthesia Assessment:                           - Prior to the procedure, a History and Physical                            was performed, and patient medications and                            allergies were reviewed. The patient's tolerance of                            previous anesthesia was also reviewed. The risks                            and benefits of the procedure and the sedation                            options and risks were discussed with the patient.                            All questions were answered, and informed consent                            was obtained. Prior Anticoagulants: The patient has  taken no previous anticoagulant or antiplatelet                            agents. ASA Grade Assessment: III - A patient with                            severe systemic disease. After reviewing the risks                            and benefits, the patient was deemed in                            satisfactory condition to undergo the procedure.                           After obtaining  informed consent, the endoscope was                            passed under direct vision. Throughout the                            procedure, the patient's blood pressure, pulse, and                            oxygen saturations were monitored continuously. The                            Endoscope was introduced through the mouth, and                            advanced to the second part of duodenum. The upper                            GI endoscopy was accomplished without difficulty.                            The patient tolerated the procedure well. Scope In: Scope Out: Findings:      One benign-appearing, intrinsic mild stenosis was found at the       gastroesophageal junction. A TTS dilator was passed through the scope.       Dilation with an 18-19-20 mm balloon dilator was performed to 20 mm.      Post fundoplication anatomy of the proximal stomach was normal appearing.      The exam was otherwise without abnormality. Impression:               - Benign, mild GE junction stenosis (probably                            related to remote fundoplication, dilated today)                           - Normal post fundoplication anatomy. Moderate Sedation:      N/A- Per Anesthesia Care Recommendation:           -  Return patient to hospital ward for ongoing care.                           - OK to d/c from a GI perspective. She should call                            [facility] to report on her response to this                            dilation in 3-4 weeks.                           - Chew your food well, eat slowly and take small                            bites. Procedure Code(s):        --- Professional ---                           [phone], Esophagogastroduodenoscopy, flexible,                            transoral; with transendoscopic balloon dilation of                            esophagus (less than 30 mm diameter) Diagnosis Code(s):        --- Professional ---                            K22.2, Esophageal obstruction                           R13.10, Dysphagia, unspecified CPT copyright 2017 [employer]. All rights reserved. The codes documented in this report are preliminary and upon coder review may  be revised to meet current compliance requirements. Milus Banister, MD 08/23/2017 11:16:25 AM Number of Addenda: 0

## 2017-08-23 NOTE — Transfer of Care (Signed)
Immediate Anesthesia Transfer of Care Note  Patient: [name]  Procedure(s) Performed: ESOPHAGOGASTRODUODENOSCOPY (EGD) WITH PROPOFOL (N/A ) BALLOON DILATION (N/A )  Patient Location: PACU  Anesthesia Type:MAC  Level of Consciousness: awake, alert , oriented and patient cooperative  Airway & Oxygen Therapy: Patient Spontanous Breathing and Patient connected to nasal cannula oxygen  Post-op Assessment: Report given to RN, Post -op Vital signs reviewed and stable and Patient moving all extremities  Post vital signs: Reviewed and stable  Last Vitals:  Vitals Value Taken Time  BP 113/58 08/23/2017 11:10 AM  Temp    Pulse 77 08/23/2017 11:11 AM  Resp 29 08/23/2017 11:11 AM  SpO2 100 % 08/23/2017 11:11 AM  Vitals shown include unvalidated device data.  Last Pain:  Vitals:   08/23/17 1110  TempSrc:   PainSc: 0-No pain      Patients Stated Pain Goal: 2 (00/76/22 6333)  Complications: No apparent anesthesia complications

## 2017-08-24 ENCOUNTER — Encounter (HOSPITAL_COMMUNITY): Payer: Self-pay | Admitting: Gastroenterology

## 2017-08-24 DIAGNOSIS — R1310 Dysphagia, unspecified: Secondary | ICD-10-CM

## 2017-08-24 DIAGNOSIS — K222 Esophageal obstruction: Secondary | ICD-10-CM

## 2017-08-24 LAB — GLUCOSE, CAPILLARY
Glucose-Capillary: 133 mg/dL — ABNORMAL HIGH (ref 65–99)
Glucose-Capillary: 218 mg/dL — ABNORMAL HIGH (ref 65–99)

## 2017-08-24 MED ORDER — ESOMEPRAZOLE MAGNESIUM 40 MG PO PACK: 40.0000 mg | PACK | Freq: Every day | ORAL | 12 refills | Status: DC

## 2017-08-24 NOTE — Progress Notes (Signed)
Patient is being discharged home. discharge instructions were given to patient and family

## 2017-08-24 NOTE — Discharge Summary (Addendum)
Physician Discharge Summary  [name] FUX:[id number] DOB: 1945-11-04 DOA: 08/19/2017  PCP: Sela Hua, MD  Admit date: 08/19/2017 Discharge date: 08/24/2017  Time spent: 45 minutes  Recommendations for Outpatient Follow-up:  Patient will be discharged to home.  Patient will need to follow up with primary care provider within one week of discharge.  Follow up with gastroenterology in 2-3 weeks. Patient should continue medications as prescribed.  Patient should follow a heart healthy/carb modified diet.  Discharge Diagnoses:  Acute kidney injury on chronic kidney disease, stage III Diabetes mellitus, type II Chest pressure/substernal pressure Epigastric pain/dysphasia Left-sided sciatica Essential hypertension Hyperkalemia Chronic normocytic anemia  Discharge Condition: Stable  Diet recommendation: heart healthy/carb modified  Filed Weights   08/19/17 1859 08/21/17 0625 08/24/17 0500  Weight: 61.2 kg (135 lb) 61.3 kg (135 lb 2.3 oz) 60.2 kg (132 lb 11.5 oz)    History of present illness:  on 5/12/219 by Dr. Jani Gravel MaryEdwardsis a71 y.o.female,w h/o colon cancer, s/p R colectomy (08/24/2010), Dm2, hypertension, hyperlipidemia, CAD s/p cardiac cath (05/09/2001) =>20% LM, 50% mid LAD, 20%, AV circumflex, 20-30% mid RCA who presents with c/o back pain with radiation down bilateral legs. Denies numbness, tingling, weakness, incontinence. Pt states worse over the past few days. Review of prior CT scan lumbar spine showed disk herniation  Pt also notes sharp chest pain (under the left breast) lasting a few minutes, , worse with movement , reproducible with palpation. Starting yesterday. Prior ETT 2015 negative. Pt denies fever, chills, cough, palp, sob, n/v, heartburn, diarrhea, brbpr, black stool.   Hospital Course:  Acute kidney injury on chronic kidney disease, stage III -Resolved -Creatinine upon admission was 1.82 (Baseline creatinine approximately  1.2-1.6) -Creatinine currently improved down to 1.02  Diabetes mellitus, type II -Seems to be uncontrolled, hemoglobin A1c 11 -Patient states she was on glyburide as an outpatient but that was discontinued and patient was placed on Lantus 10 units daily with no insulin sliding scale -Follow up with PCP and discuss better DM control  Chest pressure/substernal pressure -ACS ruled out, troponins unremarkable and EKG showed normal sinus rhythm -Suspect to be secondary to gastritis versus esophagitis -Echocardiogram: 60 to 02%, grade 1 diastolic dysfunction (normal global longitudinal strain -22.3%)  Epigastric pain/dysphasia -Patient has had pain with swallowing pills as well as food for the last several months.  She is also had a history of dilation in 2017. -CT abdomen pelvis: underdistention the stomach versus gastritis -Gastroenterology consulted and appreciated, s/p EGD: Benign, mild GE junction stenosis (probably related to remote fundoplication, dilated).  Follow-up with gastroenterology.  Chew your food well, eats slowly and take small bites. 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  Still having some problems swallowing pills.  States she is ready to go home.  Denies any current chest pain, shortness breath, abdominal pain, nausea or vomiting, diarrhea  constipation, dizziness or headache.  General: Well developed, NAD  HEENT: NCAT, mucous membranes moist.  Neck: Supple  Cardiovascular: S1 S2 auscultated, no rubs, murmurs or gallops. Regular rate and rhythm.  Respiratory: Clear to auscultation bilaterally with equal chest rise  Abdomen: Soft, nontender, nondistended, + bowel sounds  Extremities: warm dry without cyanosis clubbing or edema  Neuro: AAOx3, nonfocal  Psych: Normal affect and demeanor with intact judgement and insight  Discharge Instructions Discharge Instructions    Discharge instructions   Complete by:  As directed    Patient will be discharged to home.  Patient will need to follow up with primary care provider within one week of discharge.  Follow up with gastroenterology in 2-3 weeks. Patient should continue medications as prescribed.  Patient should follow a heart healthy/carb modified diet. Chew your food slowly, take small bites.     Allergies as of 08/24/2017      Reactions   Metformin And Related Diarrhea      Medication List    STOP taking these medications   baclofen 10 MG tablet Commonly known as:  LIORESAL   clotrimazole 1 % cream Commonly known as:  LOTRIMIN   sennosides-docusate sodium 8.6-50 MG tablet Commonly known as:  SENOKOT-S   triamcinolone 0.1 % cream : eucerin Crea     TAKE these medications   amLODipine 2.5 MG tablet Commonly known as:  NORVASC take 1 tablet by mouth once daily   aspirin 81 MG chewable tablet Chew 81 mg by mouth daily.   cetirizine 10 MG tablet Commonly known as:  ZYRTEC Take 1 tablet (10 mg total) by mouth daily. What changed:    when to take this  reasons to take this   esomeprazole 40 MG packet Commonly known as:  NEXIUM Take 40 mg by mouth daily before breakfast. What changed:    when to take this  reasons to take this   fluticasone 50 MCG/ACT nasal spray Commonly known as:  FLONASE Place 2 sprays into both nostrils daily. What  changed:    when to take this  reasons to take this   glucose blood test strip Commonly known as:  ONETOUCH VERIO Use as instructed   ibuprofen 200 MG tablet Commonly known as:  ADVIL,MOTRIN Take 2 tablets (400 mg total) by mouth every 6 (six) hours as needed (pain).   Insulin Glargine 100 UNIT/ML Solostar Pen Commonly known as:  LANTUS SOLOSTAR Inject 10 Units into the skin daily at 10 pm.   Insulin Pen Needle 31G X 5 MM Misc Check blood sugar once daily   nitroGLYCERIN 0.4 MG SL tablet Commonly known as:  NITROSTAT Place 1 tablet (0.4 mg total) under the tongue every 5 (five) minutes as needed. If chest pain not relieved by third tab call 911.   ONE TOUCH DELICA LANCING DEV Misc Check blood sugar once daily   ONETOUCH DELICA LANCETS 58K Misc Check blood sugar once daily   ONETOUCH VERIO w/Device Kit 1 each by Does not apply route daily.   polyethylene glycol powder powder Commonly known as:  GLYCOLAX/MIRALAX Take 255 g by mouth daily. What changed:    when to take this  reasons to take this   quinapril-hydrochlorothiazide 20-12.5 MG tablet Commonly known as:  ACCURETIC take 2 tablets by mouth once daily   rosuvastatin 20 MG tablet Commonly known as:  CRESTOR Take 1 tablet (20 mg total) daily by mouth.   triamcinolone cream 0.1 % Commonly known as:  KENALOG Apply 1 application topically 2 (two) times  daily. What changed:    when to take this  reasons to take this      Allergies  Allergen Reactions  . Metformin And Related Diarrhea   Follow-up Information    Mayo, Pete Pelt, MD. Schedule an appointment as soon as possible for a visit in 1 week(s).   Specialty:  Family Medicine Why:  Hospital follow up Contact information: [place] [address] [phone]        Milus Banister, MD. Schedule an appointment as soon as possible for a visit in 3 week(s).   Specialty:  Gastroenterology Why:  Hospital follow up Contact  information: [address] [address] [phone]            The results of significant diagnostics from this hospitalization (including imaging, microbiology, ancillary and laboratory) are listed below for reference.    Significant Diagnostic Studies: Ct Abdomen Pelvis Wo Contrast  Result Date: 08/20/2017 CLINICAL DATA:  72 year old female with lower abdominal pain. EXAM: CT ABDOMEN AND PELVIS WITHOUT CONTRAST TECHNIQUE: Multidetector CT imaging of the abdomen and pelvis was performed following the standard protocol without IV contrast. COMPARISON:  CT of abdomen pelvis dated 12/02/2013 FINDINGS: Evaluation of this exam is limited in the absence of intravenous contrast. Lower chest: Minimal bibasilar atelectatic changes. The visualized lung bases are otherwise clear. No intra-abdominal free air or free fluid. Hepatobiliary: Choose 1 Pancreas: Unremarkable. No pancreatic ductal dilatation or surrounding inflammatory changes. Spleen: Normal in size without focal abnormality. Adrenals/Urinary Tract: Adrenal glands are unremarkable. Kidneys are normal, without renal calculi, focal lesion, or hydronephrosis. Bladder is unremarkable. Stomach/Bowel: Postprocedural changes of Nissen fundoplication. There is thickening of the proximal stomach which may be related to underdistention or represent gastritis. Clinical correlation is recommended. No bowel obstruction. Postsurgical changes of partial right hemicolectomy. Appendectomy. Vascular/Lymphatic: There is moderate aortoiliac atherosclerotic disease. No portal venous gas. There is no adenopathy. Reproductive: Hysterectomy.  No pelvic mass. Other: None Musculoskeletal: No acute or significant osseous findings. Stimulator device in the left lower back with wire extending into the right sacral region. IMPRESSION: 1. Underdistention the stomach versus gastritis. Clinical correlation is recommended. No bowel obstruction. 2.  Aortic  Atherosclerosis (ICD10-I70.0). Electronically Signed   By: Anner Crete M.D.   On: 08/20/2017 03:12   Dg Chest 2 View  Result Date: 08/19/2017 CLINICAL DATA:  Left-sided chest pain for several hours, initial encounter EXAM: CHEST - 2 VIEW COMPARISON:  12/06/2013 FINDINGS: Cardiac shadow is within normal limits. The lungs are well aerated bilaterally. Tortuous vascularity contributes to a prominent superior mediastinum but stable in appearance from the prior study. No focal infiltrate or sizable effusion is seen. No acute bony abnormality is noted. Postoperative change in the upper abdomen is seen. IMPRESSION: No acute abnormality noted. Electronically Signed   By: Inez Catalina M.D.   On: 08/19/2017 19:56   Ct Lumbar Spine Wo Contrast  Result Date: 08/20/2017 CLINICAL DATA:  Low back pain extending to both legs. Symptoms began 08/18/2017 EXAM: CT LUMBAR SPINE WITHOUT CONTRAST TECHNIQUE: Multidetector CT imaging of the lumbar spine was performed without intravenous contrast administration. Multiplanar CT image reconstructions were also generated. COMPARISON:  CT abdomen same day. FINDINGS: Segmentation: 5 lumbar type vertebral bodies. Alignment: Normal Vertebrae: Normal.  No fracture or focal lesion. Paraspinal and other soft tissues: Negative Disc levels: No evidence of degenerative disc disease. Discs show normal height. No significant bulge. No herniation. No stenosis of the canal or foramina. Patient has ordinary  mild facet osteoarthritis at the L5-S1 level, right more than left, quite common at this age. No advanced finding. 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 Electronically Signed   By: Nelson Chimes M.D.   On: 08/20/2017 07:03    Microbiology: No results found for this or any previous visit (from the past 240 hour(s)).   Labs: Basic Metabolic Panel: Recent Labs  Lab  08/19/17 1908 08/20/17 0754 08/21/17 0600 08/21/17 1811 08/22/17 1340  NA 135 136 142 144 142  K 3.2* 3.1* 5.9* 4.5 3.9  CL 97* 100* 109 113* 111  CO2 '24 22 23 23 23  '$ GLUCOSE 533* 226* 348* 249* 205*  BUN 41* 35* 35* 29* 21*  CREATININE 1.82* 1.38*  1.33* 1.40* 1.18* 1.02*  CALCIUM 9.6 8.9 9.3 9.1 9.2   Liver Function Tests: Recent Labs  Lab 08/21/17 0600  AST 16  ALT 16  ALKPHOS 55  BILITOT 0.7  PROT 7.0  ALBUMIN 3.5   No results for input(s): LIPASE, AMYLASE in the last 168 hours. No results for input(s): AMMONIA in the last 168 hours. CBC: Recent Labs  Lab 08/19/17 1908 08/20/17 0754 08/21/17 0600 08/23/17 0544  WBC 8.6 9.2 7.0 9.4  HGB 10.3* 9.6* 9.5* 10.4*  HCT 31.6* 29.1* 29.1* 31.5*  MCV 85.6 84.8 85.3 85.1  PLT 238 186 198 224   Cardiac Enzymes: Recent Labs  Lab 08/20/17 0754 08/20/17 1344 08/20/17 2003  CKTOTAL 125  --   --   TROPONINI <0.03 <0.03 <0.03   BNP: BNP (last 3 results) No results for input(s): BNP in the last 8760 hours.  ProBNP (last 3 results) No results for input(s): PROBNP in the last 8760 hours.  CBG: Recent Labs  Lab 08/23/17 1209 08/23/17 1641 08/23/17 1953 08/23/17 2054 08/24/17 0750  GLUCAP 72 199* 234* 253* 133*       Signed:  Ann Bohne  [facility] 08/24/2017, 10:28 AM

## 2017-08-24 NOTE — Discharge Instructions (Signed)

## 2017-08-24 NOTE — Care Management Important Message (Signed)
Important Message  Patient Details  Name: CHELCY BOLDA MRN: [id number] Date of Birth: 12/21/1945   Medicare Important Message Given:  Yes    Kerin Salen 08/24/2017, 11:31 AMImportant Message  Patient Details  Name: LAMEKIA NOLDEN MRN: [id number] Date of Birth: 01-08-1946   Medicare Important Message Given:  Yes    Kerin Salen 08/24/2017, 11:31 AM

## 2017-08-28 ENCOUNTER — Telehealth: Payer: Self-pay | Admitting: Gastroenterology

## 2017-08-28 ENCOUNTER — Encounter: Payer: Self-pay | Admitting: Internal Medicine

## 2017-08-28 ENCOUNTER — Ambulatory Visit (INDEPENDENT_AMBULATORY_CARE_PROVIDER_SITE_OTHER): Payer: Medicare Other | Admitting: Internal Medicine

## 2017-08-28 ENCOUNTER — Other Ambulatory Visit: Payer: Self-pay

## 2017-08-28 DIAGNOSIS — E1165 Type 2 diabetes mellitus with hyperglycemia: Secondary | ICD-10-CM

## 2017-08-28 DIAGNOSIS — J029 Acute pharyngitis, unspecified: Secondary | ICD-10-CM | POA: Diagnosis not present

## 2017-08-28 MED ORDER — INSULIN GLARGINE 100 UNIT/ML SOLOSTAR PEN: 15.0000 [IU] | PEN_INJECTOR | Freq: Every day | SUBCUTANEOUS | 0 refills | Status: DC

## 2017-08-28 MED ORDER — SUCRALFATE 1 G PO TABS: 1.0000 g | ORAL_TABLET | Freq: Three times a day (TID) | ORAL | 0 refills | Status: DC

## 2017-08-28 NOTE — Patient Instructions (Addendum)
It was so nice to see you!  For your diabetes- please increase your Lantus to 15 units daily. Call us if you have any blood sugars less than 90.  For your sore throat- please take the carafate tablets and mash them up in 1 tablespoon of water three times per day. Start by taking clear liquids and then slowly start taking soft foods. Dr. Carlean Purl would like you to call him in the next 3-4 days to let him know how you are doing.  -Dr. Brett Albino

## 2017-08-28 NOTE — Telephone Encounter (Signed)
08/23/17 EGD dil at [facility] and since procedure has had severe sore throat and pills continue to get stuck.  She says it feels like it is a "hole" in her throat and she says if feels like air coming out of her ears.  She was advised to go to the ED for evaluation.  She states she has an appt with PCP at 2:15 pm and will go after that visit.  Will forward to Dr Ardis Hughs and Carlean Purl for review.  Carlean Purl pt)

## 2017-08-28 NOTE — Telephone Encounter (Signed)
I spoke to the patient.  She has a lot of chronic dysphagia sxs and I suspect these are similar to pre-dilation on 5/15  No fever.  Able to swallow liquids ok.  I want her to keep Fam Med appt today - and not go to ED  Unless advised by Dr. Brett Albino  She may need a gastrograffen swallow to evaluate for complication but my index of suspicion that something is really different vs continues problems as she has had over the years is very low so probably not.  Dr. Brett Albino can contact me with ? When she sees

## 2017-08-28 NOTE — Patient Outreach (Signed)
[facility]) Care Management  08/28/2017  [name] March 20, 1946 [id number]    EMMI: general discharge red alert Referral date: 08/28/17 Referral reason: scheduled follow up appointment Insurance: [place] health care and Medicaid Day # 1  Received return call from patient.  HIPAA verified. Patient states she spoke with her gastroenterologist office and was advised to go to the emergency room to have her symptoms evaluated.   RNCM advised patient to follow instructions of gastroenterology office. Advised to call or have someone call for her and reschedule her primary MD appointment scheduled for today. Patient verbalized understanding.   Quinn Plowman RN,BSN,CCM [facility] Telephonic  [phone]

## 2017-08-28 NOTE — Patient Outreach (Signed)
[facility]) Care Management  08/28/2017  [name] 1945/08/09 [id number]  EMMI: general discharge red alert Referral date: 08/28/17 Referral reason: scheduled follow up appointment Insurance: [place] health care and Medicaid Day # 1  Telephone call to patient regarding EMMI general discharge red alert. HIPAA verified with patient. Explained reason for call. Patient states she has not been able to get anyone to call her back from her primary MD office. Patient states she was discharged from the hospital on  Thursday, 08/24/17.  Patient states she called her primary MD office on Friday and today and left messages.   RNCM offered to call patients primary MD office to schedule appointment. Patient verbally agreed.  Patient states, " they went down in my throat when I was in the hospital to see what was going on.  Ever since then it feels like there is a hole in my throat, it's real sore and I'm having pain and air in my ear."  Per chart review patient had EDG during 08/19/17 - 08/24/17 inpatient stay.  Patient states she has not scheduled a follow up with the gastroenterologist. RNCM advised patient to call the gastroenterologist office today to schedule appointment and report symptoms. Patient verbalized understanding and agreement.  Patient states she has all of her medications and is taking them as prescribed. Patient states she has transportation to doctor appointments.  RNCM called patients primary MD office and spoke with Amy.  Requested post hospital discharge follow up appointment for patient and reported patients discussed symptoms. Amy scheduled patient to be seen by her primary MD today 08/28/17 at 2:15pm.  RNCM called patient and informed her of the 2:15pm follow up appointment with her primary MD for today 08/28/17. Patient states she will call her daughter to take her to the appointment. RNCM advised patient to call primary MD office back if there is any issue with  appointment.  Patient states she has had diabetes for years. Patient states she is now taking insulin and her blood sugars have been a little out of control.  Patient confirmed her last A1c was 11.  RNCM discussed and offered [facility] care management services.  Patient refused services.  Verbally agreed to receive [facility] care management brochure to review.  RNCM advised patient to notify MD of any changes in condition prior to scheduled appointment. RNCM verified patient aware of 911 services for urgent/ emergent needs.Marland Kitchen RNCM informed patient she would call her back within 4 business days for follow up. Patient verbally agreed to next telephone outreach with [facility].   PLAN: RNCM will follow up with patient within 4 business days.   Quinn Plowman RN,BSN,CCM [facility] Telephonic  [phone]

## 2017-08-30 DIAGNOSIS — J029 Acute pharyngitis, unspecified: Secondary | ICD-10-CM | POA: Insufficient documentation

## 2017-08-30 LAB — HM DIABETES EYE EXAM

## 2017-08-30 NOTE — Progress Notes (Signed)
   [facility] Phone: [phone]  Subjective:  Jamariya is a 72 year old female presenting to clinic to discuss sore throat and her diabetes.  Sore Throat: Has been going on since she had her endoscopy on 08/23/17. The endoscopy showed a mild GE junction stenosis that was dilated. She states she feels like there is a sore in the back of her throat. It hurts to swallow both solids and liquids. She states she has not been eating as much as she normally does. She feels like this is getting worse. No weight loss. No vomiting. No cough. No heartburn.  T2DM: Checking her blood sugars twice a day. Have been ranging from 130-250s. No lows. No polyuria, no polydipsia. Taking Lantus 10 units daily at home.  ROS: See HPI for pertinent positives and negatives  Past Medical History- CAD, HTN, T2DM, HLD, depression   Family history reviewed for today's visit. No changes.  Social history- patient is a never smoker  Objective: BP 124/72   Pulse 86   Temp 98.4 F (36.9 C) (Oral)   Wt 131 lb (59.4 kg)   SpO2 99%   BMI 22.49 kg/m  Gen: NAD, alert, cooperative with exam HEENT: NCAT, EOMI, MMM, TMs normal, oropharynx normal in appearance Neck: FROM, supple, no masses, no thyromegaly  Assessment/Plan: Sore Throat: Patient states that this started after her endoscopy and that it hurts to swallow. No signs of irritation on exam. Discussed with patient's gastroenterologist, Dr. Carlean Purl, on the phone. He does not think this is related to her endoscopy. He recommends Carafate mixed up into 1 tablespoon of water 3-4 times daily. He also recommends starting with clear liquids and advancing to a dysphagia diet. He would like for patient to call him in the next few days to give him an update. Discussed these recommendations with patient in detail and she voiced understanding. I also discussed with patient that she could try some Chloraseptic throat spray to see if this helps.    T2DM: Uncontrolled. Last A1c was 11 on 08/21/17. Has been unable to tolerate Metformin, Trulicity, and Jardiance in the past due to side effects.  - Increase Lantus from 10 units daily to 15 units daily - Continue checking blood sugar 1-2 times daily - Patient will call for any blood sugars <90 - Otherwise, follow-up in 1 month   Hyman Bible, MD PGY-3

## 2017-08-30 NOTE — Assessment & Plan Note (Signed)
Uncontrolled. Last A1c was 11 on 08/21/17. Has been unable to tolerate Metformin, Trulicity, and Jardiance in the past due to side effects.  - Increase Lantus from 10 units daily to 15 units daily - Continue checking blood sugar 1-2 times daily - Patient will call for any blood sugars <90 - Otherwise, follow-up in 1 month

## 2017-08-30 NOTE — Assessment & Plan Note (Signed)
Patient states that this started after her endoscopy and that it hurts to swallow. No signs of irritation on exam. Discussed with patient's gastroenterologist, Dr. Carlean Purl, on the phone. He does not think this is related to her endoscopy. He recommends Carafate mixed up into 1 tablespoon of water 3-4 times daily. He also recommends starting with clear liquids and advancing to a dysphagia diet. He would like for patient to call him in the next few days to give him an update. Discussed these recommendations with patient in detail and she voiced understanding. I also discussed with patient that she could try some Chloraseptic throat spray to see if this helps.

## 2017-09-01 ENCOUNTER — Other Ambulatory Visit: Payer: Self-pay

## 2017-09-01 ENCOUNTER — Encounter (HOSPITAL_COMMUNITY): Payer: Self-pay | Admitting: Gastroenterology

## 2017-09-01 NOTE — Patient Outreach (Signed)
[facility]) Care Management  09/01/2017  [name] October 10, 1945 [id number]   EMMI: general discharge red alert Referral date: 08/28/17 Referral reason: scheduled follow up appointment Insurance: [place] health care and Medicaid  Telephone call to patient for EMMI general discharge follow up. HIPAA verified with patient. Patient states she did not go to the ED as directed by the gastroenterologist's nurse.  Patient states she received a call from the gastroenterologist, Dr. Carlean Purl who advised her not to go to the emergency room but keep follow up appointment with her primary that day.  Patient states Dr. Carlean Purl called her primary MD and spoke with her about patients concerns/ symptoms. Patient states Dr. Carlean Purl advised her primary MD what to prescribe along with other recommendations.  Patient states she is taking the medication that was prescribed and is feeling much better. Patient states she has a follow up appointment with her primary MD 09/28/17. Patient states her primary MD also increased her lantus to 15 units.   RNCM offered again to patient [facility] care management services regarding diabetes education and management.  Patient refused.  Patient verbally agreed to receive [facility] care management brochure for future reference.   PLAN; RNCM will close patient due to patient being assessed and having no further need.  RNCM will send patient [facility] care management brochure as discussed.   Quinn Plowman RN,BSN,CCM [facility] Telephonic  [phone]

## 2017-09-09 ENCOUNTER — Other Ambulatory Visit: Payer: Self-pay | Admitting: Internal Medicine

## 2017-09-14 ENCOUNTER — Encounter: Payer: Self-pay | Admitting: Internal Medicine

## 2017-09-28 ENCOUNTER — Ambulatory Visit: Payer: Medicare Other | Admitting: Internal Medicine

## 2017-09-28 ENCOUNTER — Encounter: Payer: Self-pay | Admitting: Internal Medicine

## 2017-09-28 ENCOUNTER — Ambulatory Visit (INDEPENDENT_AMBULATORY_CARE_PROVIDER_SITE_OTHER): Payer: Medicare Other | Admitting: Internal Medicine

## 2017-09-28 ENCOUNTER — Other Ambulatory Visit: Payer: Self-pay

## 2017-09-28 VITALS — BP 128/70 | HR 68 | Temp 98.3°F | Ht 64.0 in | Wt 133.0 lb

## 2017-09-28 DIAGNOSIS — E1165 Type 2 diabetes mellitus with hyperglycemia: Secondary | ICD-10-CM

## 2017-09-28 DIAGNOSIS — R05 Cough: Secondary | ICD-10-CM | POA: Diagnosis not present

## 2017-09-28 DIAGNOSIS — R059 Cough, unspecified: Secondary | ICD-10-CM

## 2017-09-28 DIAGNOSIS — I10 Essential (primary) hypertension: Secondary | ICD-10-CM | POA: Diagnosis not present

## 2017-09-28 DIAGNOSIS — IMO0001 Reserved for inherently not codable concepts without codable children: Secondary | ICD-10-CM

## 2017-09-28 DIAGNOSIS — E559 Vitamin D deficiency, unspecified: Secondary | ICD-10-CM

## 2017-09-28 MED ORDER — INSULIN GLARGINE 100 UNIT/ML SOLOSTAR PEN: 15.0000 [IU] | PEN_INJECTOR | Freq: Every day | SUBCUTANEOUS | 0 refills | Status: DC

## 2017-09-28 MED ORDER — NITROGLYCERIN 0.4 MG SL SUBL: 0.4000 mg | SUBLINGUAL_TABLET | SUBLINGUAL | 0 refills | Status: DC | PRN

## 2017-09-28 MED ORDER — AMLODIPINE BESYLATE 2.5 MG PO TABS: 2.5000 mg | ORAL_TABLET | Freq: Every day | ORAL | 1 refills | Status: DC

## 2017-09-28 MED ORDER — QUINAPRIL-HYDROCHLOROTHIAZIDE 20-12.5 MG PO TABS: 2.0000 | ORAL_TABLET | Freq: Every day | ORAL | 3 refills | Status: DC

## 2017-09-28 MED ORDER — INSULIN LISPRO 100 UNIT/ML (KWIKPEN): PEN_INJECTOR | SUBCUTANEOUS | 0 refills | Status: DC

## 2017-09-28 MED ORDER — BENZONATATE 100 MG PO CAPS: 100.0000 mg | ORAL_CAPSULE | Freq: Two times a day (BID) | ORAL | 0 refills | Status: DC | PRN

## 2017-09-28 MED ORDER — ESOMEPRAZOLE MAGNESIUM 40 MG PO PACK: 40.0000 mg | PACK | Freq: Every day | ORAL | 12 refills | Status: DC

## 2017-09-28 NOTE — Patient Instructions (Addendum)
It was so nice to see you today!  For your cough- I have sent in a cough medicine called Tessalon. You can use this twice a day as needed.  For your diabetes- I have started a short-acting insulin called Humalog. Please take 5 units with your largest meal of the day. You should continue taking Lantus 15 units daily. Please STOP the glipizide.  We will see you back in 1 month!  -Dr. Brett Albino

## 2017-09-28 NOTE — Progress Notes (Signed)
   [facility] Phone: [phone]  Subjective:  Garry is a 72 year old female presenting to clinic with cough and to discuss T2DM.  Cough: Has been going on for the last week. Worse at night. Also having some nasal congestion. Feels like she has mucus in her throat. Her grandson is sick with similar symptoms. No fevers.  T2DM: Last seen in clinic for this on 08/28/17. Lantus was increased from 10 units to 15 units daily. Checking her blood sugars three times a day. Have been ranging in the 150s-400s. Most numbers in the 100s-200s. Has had three readings in the 400s in the last month. She has had one reading that was 68. She was worried about her blood sugars being high, so she restarted her Glipizide 5mg  daily. Blood sugars have been under better control after that. Denies polyuria or polydipsia.   ROS: See HPI for pertinent positives and negatives  Past Medical History- CAD, HTN, T2DM, HLD, depression   Family history reviewed for today's visit. No changes.  Social history- patient is a never smoker  Objective: BP 128/70 (BP Location: Left Arm, Patient Position: Sitting, Cuff Size: Large)   Pulse 68   Temp 98.3 F (36.8 C) (Oral)   Ht 5\' 4"  (1.626 m)   Wt 133 lb (60.3 kg)   SpO2 97%   BMI 22.83 kg/m  Gen: NAD, alert, cooperative with exam HEENT: NCAT, EOMI, MMM, TMs normal, oropharynx mildly erythematous, nasal turbinates erythematous Neck: FROM, supple Resp: Normal work of breathing GI: SNTND, BS present, no guarding or organomegaly Msk: No edema, warm, normal tone, moves UE/LE spontaneously Neuro: Alert and oriented, no gross deficits Skin: No rashes, no lesions Psych: Appropriate behavior  Assessment/Plan: Cough: Likely due to viral URI given her associated nasal congestion and sick contact. - Advised symptomatic treatment with Tylenol and hot tea with honey - Prescribed Tessalon to help with cough - Return precautions  discussed  T2DM: Uncontrolled. Last A1c 08/2017 was 11.0. Goal ~8. CBGs still ranging from 100s-400s after increasing Lantus from 10 units to 15 units. Patient has been unable to tolerate Metformin, Trulicity, and Jardiance in the past due to side effects. - Continue Lantus 15 units qhs - Add Humalog 5 units with largest meal of the day (patient states this is usually dinner) - Advised patient to stop Glipizide (she restarted this on her own, but we discussed the dangers of using insulin and glipizide together in a patient her age) - Continue checking blood sugars 2-3 times daily - Follow-up with new PCP in 1 month for recheck. Could consider adding Humalog to additional meals throughout the day. Could also consider trying Januvia, as patient has not yet been tried on this medication. - Discussed with Dr. Sande Brothers, MD PGY-3

## 2017-09-29 ENCOUNTER — Other Ambulatory Visit: Payer: Self-pay | Admitting: Internal Medicine

## 2017-09-29 DIAGNOSIS — R059 Cough, unspecified: Secondary | ICD-10-CM | POA: Insufficient documentation

## 2017-09-29 DIAGNOSIS — R05 Cough: Secondary | ICD-10-CM | POA: Insufficient documentation

## 2017-09-29 NOTE — Assessment & Plan Note (Signed)
Likely due to viral URI given her associated nasal congestion and sick contact. - Advised symptomatic treatment with Tylenol and hot tea with honey - Prescribed Tessalon to help with cough - Return precautions discussed

## 2017-09-29 NOTE — Assessment & Plan Note (Signed)
Uncontrolled. Last A1c 08/2017 was 11.0. Goal ~8. CBGs still ranging from 100s-400s after increasing Lantus from 10 units to 15 units. Patient has been unable to tolerate Metformin, Trulicity, and Jardiance in the past due to side effects. - Continue Lantus 15 units qhs - Add Humalog 5 units with largest meal of the day (patient states this is usually dinner) - Advised patient to stop Glipizide (she restarted this on her own, but we discussed the dangers of using insulin and glipizide together in a patient her age) - Continue checking blood sugars 2-3 times daily - Follow-up with new PCP in 1 month for recheck. Could consider adding Humalog to additional meals throughout the day. Could also consider trying Januvia, as patient has not yet been tried on this medication. - Discussed with Dr. Valentina Lucks

## 2017-10-05 ENCOUNTER — Other Ambulatory Visit: Payer: Self-pay | Admitting: Internal Medicine

## 2017-10-10 ENCOUNTER — Other Ambulatory Visit: Payer: Self-pay | Admitting: Internal Medicine

## 2017-10-10 DIAGNOSIS — I10 Essential (primary) hypertension: Secondary | ICD-10-CM

## 2017-10-10 NOTE — Telephone Encounter (Signed)
Attempted to call [name] to clarify rx for nitroglycerine, pt no at home will try to call back.  If pt calls back, ensure that she has not used 25 tabs of nitro in one week. If pt has used 25 tabs in one week, please insist that she be seen in the ED.

## 2017-10-11 ENCOUNTER — Other Ambulatory Visit: Payer: Self-pay

## 2017-10-11 MED ORDER — SUCRALFATE 1 G PO TABS: ORAL_TABLET | ORAL | 2 refills | Status: DC

## 2017-10-11 NOTE — Telephone Encounter (Signed)
Following 6 weeks of QID, medication changed to BID

## 2017-10-11 NOTE — Telephone Encounter (Signed)
Spoke with pts granddaughter, who helps with her medications. She does not think this medication was ever picked up for use. Pts granddaughter will talk with pt to see if they can just pick up the original rx.

## 2017-12-14 ENCOUNTER — Encounter: Payer: Self-pay | Admitting: Family Medicine

## 2017-12-14 ENCOUNTER — Ambulatory Visit (INDEPENDENT_AMBULATORY_CARE_PROVIDER_SITE_OTHER): Payer: Medicare Other | Admitting: Family Medicine

## 2017-12-14 ENCOUNTER — Other Ambulatory Visit: Payer: Self-pay

## 2017-12-14 VITALS — BP 128/70 | HR 66 | Temp 98.7°F | Ht 64.0 in | Wt 133.0 lb

## 2017-12-14 DIAGNOSIS — M79672 Pain in left foot: Secondary | ICD-10-CM

## 2017-12-14 DIAGNOSIS — E559 Vitamin D deficiency, unspecified: Secondary | ICD-10-CM

## 2017-12-14 DIAGNOSIS — E1165 Type 2 diabetes mellitus with hyperglycemia: Secondary | ICD-10-CM | POA: Diagnosis not present

## 2017-12-14 DIAGNOSIS — M79671 Pain in right foot: Secondary | ICD-10-CM

## 2017-12-14 DIAGNOSIS — IMO0001 Reserved for inherently not codable concepts without codable children: Secondary | ICD-10-CM

## 2017-12-14 DIAGNOSIS — L299 Pruritus, unspecified: Secondary | ICD-10-CM | POA: Diagnosis not present

## 2017-12-14 DIAGNOSIS — Z23 Encounter for immunization: Secondary | ICD-10-CM | POA: Diagnosis not present

## 2017-12-14 HISTORY — DX: Pain in right foot: M79.671

## 2017-12-14 LAB — POCT GLYCOSYLATED HEMOGLOBIN (HGB A1C): HbA1c, POC (controlled diabetic range): 12.5 % — AB (ref 0.0–7.0)

## 2017-12-14 MED ORDER — ESOMEPRAZOLE MAGNESIUM 40 MG PO PACK: 40.0000 mg | PACK | Freq: Every day | ORAL | 12 refills | Status: DC

## 2017-12-14 MED ORDER — INSULIN GLARGINE 100 UNIT/ML SOLOSTAR PEN: 15.0000 [IU] | PEN_INJECTOR | Freq: Every day | SUBCUTANEOUS | 0 refills | Status: DC

## 2017-12-14 MED ORDER — GLUCOSE BLOOD VI STRP: ORAL_STRIP | 12 refills | Status: DC

## 2017-12-14 MED ORDER — QUINAPRIL-HYDROCHLOROTHIAZIDE 20-12.5 MG PO TABS: 2.0000 | ORAL_TABLET | Freq: Every day | ORAL | 3 refills | Status: DC

## 2017-12-14 MED ORDER — ROSUVASTATIN CALCIUM 20 MG PO TABS: 20.0000 mg | ORAL_TABLET | Freq: Every day | ORAL | 3 refills | Status: DC

## 2017-12-14 MED ORDER — AMLODIPINE BESYLATE 2.5 MG PO TABS
2.5000 mg | ORAL_TABLET | Freq: Every day | ORAL | 3 refills | Status: DC
Start: 2017-12-14 — End: 2018-05-08

## 2017-12-14 MED ORDER — CETIRIZINE HCL 10 MG PO TABS: 10.0000 mg | ORAL_TABLET | Freq: Every day | ORAL | 1 refills | Status: DC | PRN

## 2017-12-14 MED ORDER — EMPAGLIFLOZIN 10 MG PO TABS: 10.0000 mg | ORAL_TABLET | Freq: Every day | ORAL | 2 refills | Status: DC

## 2017-12-14 NOTE — Progress Notes (Signed)
Subjective:  [name] is a 72 y.o. female who presents to the [facility] today with a chief complaint of poor glucose control.   HPI: Diabetes: Since her last visit she has been taking her Lantus nightly and aspart with breakfast.  She reports that she does not miss doses.  She did have a log of her morning blood glucose.  Lowest a.m. blood glucose was 141 with most readings being between 200 and 300.  She has been on metformin in the past and is not willing to restart metformin due to significant diarrhea she said.  She has been on Jardiance in the past but is willing to try that.  She is also willing to try Januvia if necessary.    Problem  Pain in Both Feet   Patient reports that she had has had some foot pain bilaterally for the past 7 years.  She reports that it seems to have gotten worse in the past 2 months.  This pain is not burning.  She reports good sensation in both feet.  She sometimes has trouble walking due to the pain.  It is described as though she has a wound on her foot.  She has not had recent trauma to her feet and denies open wounds on her feet.   Itching   For the past 3 weeks she has noticed full body itching.  She has not noticed any specific areas for the itching is worse.  She has tried to apply triamcinolone and petroleum jelly to reduce the itching.  This does not seem to be effective so far.  She denies right upper quadrant pain and denies any history of illicit drug use.  She has not had issues in the past with bedbugs nor does she have any pets with fleas.   DM (Diabetes Mellitus), Type 2, Uncontrolled (Hcc)    Retinal specialist: Dr. Zigmund Daniel.  Ophthalmologist: Dr. Bing Plume.       Objective:  Physical Exam: BP 128/70 (BP Location: Right Arm, Patient Position: Sitting, Cuff Size: Normal)   Pulse 66   Temp 98.7 F (37.1 C) (Oral)   Ht 5\' 4"  (1.626 m)   Wt 133 lb (60.3 kg)   SpO2 99%   BMI 22.83 kg/m   Foot exam: Both feet were examined and without  ulcerations or wounds of any kind.  Sensation to soft touch was intact bilaterally.  On microfilament exam the patient's left first toe had decreased sensation.  No other abnormalities noted.  Results for orders placed or performed in visit on 12/14/17 (from the past 72 hour(s))  HgB A1c     Status: Abnormal   Collection Time: 12/14/17  3:45 PM  Result Value Ref Range   Hemoglobin A1C     HbA1c POC (<> result, manual entry)     HbA1c, POC (prediabetic range)     HbA1c, POC (controlled diabetic range) 12.5 (A) 0.0 - 7.0 %     Assessment/Plan:  DM (diabetes mellitus), type 2, uncontrolled (HCC) Uncontrolled.  A1c today in clinic 12.5.  A.m. fasting CBGs range from 141-300s.  Patient is not willing to try the XR formulation of metformin due to previous bad experience with metformin.  She is willing to attempt Jardiance again.  We will stop her morning aspart because she has run out and it does not seem to have made much of a difference. -Continue Lantus 15 at night -Stop aspart -Restart Jardiance -Return in 1 month to reassess  Itching That  is not entirely clear what the cause of her itching is we will begin by trying antihistamines.  We can readdress this during her subsequent visit to see if it is improved with Zyrtec. -Zyrtec as needed  Pain in both feet We will continue to monitor for now.  With a 10-year history of diabetes it is possible that there is an element of neuropathy involved.  Will consider gabapentin at future visits.  It is unclear if this is a significant change from her baseline as this is my first meeting with Mrs. Penton.

## 2017-12-14 NOTE — Assessment & Plan Note (Signed)
We will continue to monitor for now.  With a 10-year history of diabetes it is possible that there is an element of neuropathy involved.  Will consider gabapentin at future visits.  It is unclear if this is a significant change from her baseline as this is my first meeting with [name].

## 2017-12-14 NOTE — Patient Instructions (Signed)
To review what we talked about today: Diabetes: -Continue taking Lantus 15 units at night -Continue checking your blood sugar in the morning -STOP taking Humalog at breakfast Itching -Start taking Zyrtec to help with your itching  I would love to see you again in the office in one to two months.

## 2017-12-14 NOTE — Progress Notes (Signed)
POC4

## 2017-12-14 NOTE — Assessment & Plan Note (Signed)
That is not entirely clear what the cause of her itching is we will begin by trying antihistamines.  We can readdress this during her subsequent visit to see if it is improved with Zyrtec. -Zyrtec as needed

## 2017-12-14 NOTE — Assessment & Plan Note (Signed)
Uncontrolled.  A1c today in clinic 12.5.  A.m. fasting CBGs range from 141-300s.  Patient is not willing to try the XR formulation of metformin due to previous bad experience with metformin.  She is willing to attempt Jardiance again.  We will stop her morning aspart because she has run out and it does not seem to have made much of a difference. -Continue Lantus 15 at night -Stop aspart -Restart Jardiance -Return in 1 month to reassess

## 2018-02-02 ENCOUNTER — Ambulatory Visit: Payer: Medicare Other | Admitting: Family Medicine

## 2018-03-13 ENCOUNTER — Other Ambulatory Visit: Payer: Self-pay

## 2018-03-13 MED ORDER — INSULIN GLARGINE 100 UNIT/ML SOLOSTAR PEN: 15.0000 [IU] | PEN_INJECTOR | Freq: Every day | SUBCUTANEOUS | 0 refills | Status: DC

## 2018-03-13 NOTE — Telephone Encounter (Signed)
I am refilling the insulin but I would like Ms. Brensinger to be seen in clinic this month for a diabetes follow up.  Please call and have her schedule an appointment.  Thank you.  Frutoso Chase

## 2018-03-14 NOTE — Telephone Encounter (Signed)
Spoke with patient and advised her that she would need a follow up appt.  Appt made for 04/12/17 with Dr. Pilar Plate.  Jazmin Hartsell,CMA

## 2018-03-16 ENCOUNTER — Other Ambulatory Visit: Payer: Self-pay | Admitting: Internal Medicine

## 2018-03-27 ENCOUNTER — Other Ambulatory Visit: Payer: Self-pay | Admitting: *Deleted

## 2018-03-27 DIAGNOSIS — E1165 Type 2 diabetes mellitus with hyperglycemia: Secondary | ICD-10-CM

## 2018-03-27 MED ORDER — EMPAGLIFLOZIN 10 MG PO TABS: 10.0000 mg | ORAL_TABLET | Freq: Every day | ORAL | 2 refills | Status: DC

## 2018-04-12 ENCOUNTER — Encounter: Payer: Self-pay | Admitting: Family Medicine

## 2018-04-12 ENCOUNTER — Ambulatory Visit (HOSPITAL_COMMUNITY): Payer: Medicare Other

## 2018-04-12 ENCOUNTER — Other Ambulatory Visit: Payer: Self-pay

## 2018-04-12 ENCOUNTER — Ambulatory Visit (INDEPENDENT_AMBULATORY_CARE_PROVIDER_SITE_OTHER): Payer: Medicare Other | Admitting: Family Medicine

## 2018-04-12 ENCOUNTER — Ambulatory Visit (HOSPITAL_COMMUNITY)
Admission: RE | Admit: 2018-04-12 | Discharge: 2018-04-12 | Disposition: A | Discharge status: Home / Self Care | Payer: Medicare Other | Source: Ambulatory Visit | Attending: Family Medicine | Admitting: Family Medicine

## 2018-04-12 VITALS — BP 112/60 | HR 74 | Temp 98.3°F | Ht 64.0 in | Wt 129.0 lb

## 2018-04-12 DIAGNOSIS — E1165 Type 2 diabetes mellitus with hyperglycemia: Secondary | ICD-10-CM | POA: Diagnosis not present

## 2018-04-12 DIAGNOSIS — I25118 Atherosclerotic heart disease of native coronary artery with other forms of angina pectoris: Secondary | ICD-10-CM

## 2018-04-12 DIAGNOSIS — I208 Other forms of angina pectoris: Secondary | ICD-10-CM | POA: Insufficient documentation

## 2018-04-12 DIAGNOSIS — I10 Essential (primary) hypertension: Secondary | ICD-10-CM | POA: Diagnosis not present

## 2018-04-12 LAB — POCT GLYCOSYLATED HEMOGLOBIN (HGB A1C): HbA1c, POC (controlled diabetic range): 10.3 % — AB (ref 0.0–7.0)

## 2018-04-12 MED ORDER — POLYETHYLENE GLYCOL 3350 17 GM/SCOOP PO POWD
1.0000 | Freq: Every day | ORAL | 3 refills | Status: DC | PRN
Start: 2018-04-12 — End: 2018-04-12

## 2018-04-12 MED ORDER — POLYETHYLENE GLYCOL 3350 17 GM/SCOOP PO POWD: 1.0000 | Freq: Every day | ORAL | 3 refills | Status: DC | PRN

## 2018-04-12 MED ORDER — NITROGLYCERIN 0.4 MG SL SUBL: 0.4000 mg | SUBLINGUAL_TABLET | SUBLINGUAL | 0 refills | Status: DC | PRN

## 2018-04-12 MED ORDER — INSULIN GLARGINE 100 UNIT/ML SOLOSTAR PEN: 15.0000 [IU] | PEN_INJECTOR | Freq: Every day | SUBCUTANEOUS | 0 refills | Status: DC

## 2018-04-12 NOTE — Progress Notes (Signed)
Cardiology Office Note:    Date:  04/13/2018   ID:  [name], DOB 1946-02-03, MRN [id number]  PCP:  [name], MD  Cardiologist:  No primary care provider on file.   Referring MD: Dickie La, MD   Chief Complaint  Patient presents with  . Coronary Artery Disease    Angina pectoris    History of Present Illness:    [name] is a 73 y.o. female with a hx of multiple cardiac risk factors including type 2 diabetes, hypertension, hyperlipidemia, with prior coronary angiography being referred by Dr. [name] for cardiac evaluation of chest pain.  Low risk myocardial perfusion study August 2015.  The patient is very pleasant 73 year old African-American female with prior history of nonobstructive coronary disease documented by angiography in 2003 by Dr. Christy Sartorius at which time the patient had 20% plaque in LAD, mild diffuse mid and distal LAD disease, eccentric 50% mid stenosis in LAD, mild irregularities in the circumflex, and 20 to 30% mid RCA.  Over the past 3 to 4 months she has had exertional fatigue, discomfort that is in the central chest described as an ache and associated with radiation to the jaw.  If she walks too far she becomes very weak and dyspneic.  She was unable to shop at [facility] for Christmas without becoming breathless and having chest discomfort.  Rare occasions have been associated with discomfort occurring at rest.  She feels fine today.  She has been prescribed nitroglycerin in the past.  She was referred by Dr. Pilar Plate for further cardiac evaluation.  She does not smoke.  She has been diabetic for greater than 20 years.  She has history of hypertension hyperlipidemia and asthma.  Past Medical History:  Diagnosis Date  . Allergy   . Asthma   . Blood transfusion without reported diagnosis 12/23/2004   with colon cancer  . Brain tumor (benign) ([place])   . Colon cancer [facility]) Nov. 27,  2006  . Diabetes mellitus    Type 2  . Female bladder prolapse     . Gastritis   . GERD (gastroesophageal reflux disease)   . Glaucoma   . Hypercholesterolemia   . Hypertension     Past Surgical History:  Procedure Laterality Date  . ABDOMINAL HYSTERECTOMY  1976  . BALLOON DILATION N/A 08/23/2017   Procedure: BALLOON DILATION;  Surgeon: Milus Banister, MD;  Location: [facility];  Service: Endoscopy;  Laterality: N/A;  Esophageal Diltation  . bladder tac    . CARDIAC CATHETERIZATION  2005  . CATARACT EXTRACTION, BILATERAL    . COLONOSCOPY W/ BIOPSIES    . ESOPHAGEAL MANOMETRY N/A 03/20/2015   Procedure: ESOPHAGEAL MANOMETRY (EM);  Surgeon: Gatha Mayer, MD;  Location: [facility];  Service: Endoscopy;  Laterality: N/A;  . ESOPHAGOGASTRODUODENOSCOPY (EGD) WITH PROPOFOL N/A 08/23/2017   Procedure: ESOPHAGOGASTRODUODENOSCOPY (EGD) WITH PROPOFOL;  Surgeon: Milus Banister, MD;  Location: [facility];  Service: Endoscopy;  Laterality: N/A;  . EYE SURGERY     mole removed left eye  . LAPAROSCOPIC NISSEN FUNDOPLICATION  5170  . repair prolapsedbladder    . RIGHT COLECTOMY  Nov. 27, 2006  . TMJ ARTHROPLASTY    . UPPER GASTROINTESTINAL ENDOSCOPY      Current Medications: Current Meds  Medication Sig  . amLODipine (NORVASC) 2.5 MG tablet Take 1 tablet (2.5 mg total) by mouth daily.  Marland Kitchen aspirin 81 MG chewable tablet Chew 81 mg by mouth daily.    Marland Kitchen  Blood Glucose Monitoring Suppl (ONETOUCH VERIO) w/Device KIT 1 each by Does not apply route daily.  . cetirizine (ZYRTEC) 10 MG tablet Take 1 tablet (10 mg total) by mouth daily as needed for allergies.  Marland Kitchen empagliflozin (JARDIANCE) 10 MG TABS tablet Take 10 mg by mouth daily.  Marland Kitchen esomeprazole (NEXIUM) 40 MG packet Take 40 mg by mouth daily before breakfast.  . fluticasone (FLONASE) 50 MCG/ACT nasal spray Place 2 sprays into both nostrils daily. (Patient taking differently: Place 2 sprays into both nostrils daily as needed for allergies. )  . glucose blood (ONETOUCH VERIO) test strip Use as instructed  .  Insulin Glargine (LANTUS SOLOSTAR) 100 UNIT/ML Solostar Pen Inject 15 Units into the skin daily at 10 pm.  . Insulin Pen Needle 31G X 5 MM MISC Check blood sugar once daily  . Lancet Devices (ONE TOUCH DELICA LANCING DEV) MISC Check blood sugar once daily  . nitroGLYCERIN (NITROSTAT) 0.4 MG SL tablet Place 1 tablet (0.4 mg total) under the tongue every 5 (five) minutes as needed. If chest pain not relieved by third tab call 911.  Glory Rosebush DELICA LANCETS 01U MISC CHECK BLOOD SUGAR ONCE DAILY  . quinapril-hydrochlorothiazide (ACCURETIC) 20-12.5 MG tablet Take 2 tablets by mouth daily.  . rosuvastatin (CRESTOR) 20 MG tablet TAKE 1 TABLET BY MOUTH ONCE DAILY  . triamcinolone cream (KENALOG) 0.1 % Apply 1 application topically 2 (two) times daily. (Patient taking differently: Apply 1 application topically 2 (two) times daily as needed (rash). )     Allergies:   Metformin and related   Social History   Socioeconomic History  . Marital status: Legally Separated    Spouse name: Not on file  . Number of children: 2  . Years of education: Not on file  . Highest education level: Not on file  Occupational History  . Not on file  Social Needs  . Financial resource strain: Not on file  . Food insecurity:    Worry: Not on file    Inability: Not on file  . Transportation needs:    Medical: Not on file    Non-medical: Not on file  Tobacco Use  . Smoking status: Never Smoker  . Smokeless tobacco: Never Used  Substance and Sexual Activity  . Alcohol use: No  . Drug use: No  . Sexual activity: Not on file  Lifestyle  . Physical activity:    Days per week: Not on file    Minutes per session: Not on file  . Stress: Not on file  Relationships  . Social connections:    Talks on phone: Not on file    Gets together: Not on file    Attends religious service: Not on file    Active member of club or organization: Not on file    Attends meetings of clubs or organizations: Not on file     Relationship status: Not on file  Other Topics Concern  . Not on file  Social History Narrative  . Not on file     Family History: The patient's family history includes Bone cancer in her father; Diabetes in her daughter; Heart attack in her daughter; Hypertension in her daughter; Lymphoma in an other family member; Suicidality in her son. There is no history of Colon cancer or Stomach cancer.  ROS:   Please see the history of present illness.    Vision disturbance, snoring, constipation, difficulty with balance, excessive fatigue, skipped heartbeats, chest pain, and weight loss.  All other  systems reviewed and are negative.  EKGs/Labs/Other Studies Reviewed:    The following studies were reviewed today: Echocardiogram 08/20/2017: ------------------------------------------------------------------- Study Conclusions  - Left ventricle: The cavity size was normal. There was mild   concentric hypertrophy. Systolic function was normal. The   estimated ejection fraction was in the range of 60% to 65%. Wall   motion was normal; there were no regional wall motion   abnormalities. Doppler parameters are consistent with abnormal   left ventricular relaxation (grade 1 diastolic dysfunction).   Doppler parameters are consistent with elevated ventricular   end-diastolic filling pressure. - Aortic valve: Trileaflet; normal thickness leaflets. There was no   regurgitation. - Mitral valve: There was trivial regurgitation. - Right ventricle: Systolic function was normal. - Right atrium: The atrium was normal in size. - Tricuspid valve: There was mild regurgitation. - Pulmonic valve: There was no regurgitation. - Pulmonary arteries: Systolic pressure was at the upper limits of   normal. - Pericardium, extracardiac: There was no pericardial effusion.  Impressions:  - Normal global longitudinal strain : -22.3%.  EKG:  EKG is formed on 04/12/2018 and demonstrates sinus rhythm, no Q waves, and  prominent T waves.  No ischemic changes noted.  Recent Labs: 08/21/2017: ALT 16 08/22/2017: BUN 21; Creatinine, Ser 1.02; Potassium 3.9; Sodium 142 08/23/2017: Hemoglobin 10.4; Platelets 224 04/12/2018: TSH 0.516  Recent Lipid Panel    Component Value Date/Time   CHOL 84 (L) 04/12/2018 1443   TRIG 123 04/12/2018 1443   HDL 43 04/12/2018 1443   CHOLHDL 2.0 04/12/2018 1443   CHOLHDL 3.8 07/20/2015 1641   VLDL 74 (H) 07/20/2015 1641   LDLCALC 16 04/12/2018 1443    Physical Exam:    VS:  BP (!) 114/54   Pulse 62   Ht _0  (1.626 m)   Wt 129 lb (58.5 kg)   LMP  (Approximate)   SpO2 100%   BMI 22.14 kg/m     Wt Readings from Last 3 Encounters:  04/13/18 129 lb (58.5 kg)  04/12/18 129 lb (58.5 kg)  12/14/17 133 lb (60.3 kg)     GEN: He is older than the stated age. No acute distress HEENT: Normal NECK: No JVD. LYMPHATICS: No lymphadenopathy CARDIAC: RRR.  No murmur, gallop, edema VASCULAR: Pulses are 2+ and symmetric in the radial and carotid.  Posterior tibials also 2+., Bruits are not present RESPIRATORY:  Clear to auscultation without rales, wheezing or rhonchi  ABDOMEN: Soft, non-tender, non-distended, No pulsatile mass, MUSCULOSKELETAL: No deformity  SKIN: Warm and dry NEUROLOGIC:  Alert and oriented x 3 PSYCHIATRIC:  Normal affect   ASSESSMENT:    1. Precordial pain   2. Uncontrolled type 2 diabetes mellitus with hyperglycemia ([address])   3. HYPERTENSION, BENIGN ESSENTIAL   4. HYPERTRIGLYCERIDEMIA   5. Coronary artery disease of native heart with stable angina pectoris, unspecified vessel or lesion type ([place])   6. Stable angina (HCC)    PLAN:    In order of problems listed above:  1. 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  Patient has known nonobstructive disease and longstanding diabetes mellitus.  Given the nature of the patient's symptoms, I am prescribing sublingual nitroglycerin.  She is instructed on use.  Blood pressure is relatively low and heart rate is  62 bpm.  I have recommended that she undergo coronary angiography to define anatomy.  I believe this will likely demonstrate significant progression since her last coronary angiogram in 2008.  The procedure including the risk of stroke, death,  myocardial infarction, emergency surgery, radiation injury, kidney injury, bleeding, and others have been discussed in detail with the patient and accepted. 2. Hemoglobin A 1C target should be less than 7. 3. Target blood pressure 130/80 mmHg or less. 4. May need to consider icosapent.  Overall education and awareness concerning primary/secondary risk prevention was discussed in detail: LDL less than 70, hemoglobin A1c less than 7, blood pressure target less than 130/80 mmHg, >150 minutes of moderate aerobic activity per week, avoidance of smoking, weight control (via diet and exercise), and continued surveillance/management of/for obstructive sleep apnea.  The patient was counseled to undergo left heart catheterization, coronary angiography, and possible percutaneous coronary intervention with stent implantation. The procedural risks and benefits were discussed in detail. The risks discussed included death, stroke, myocardial infarction, life-threatening bleeding, limb ischemia, kidney injury, allergy, and possible emergency cardiac surgery. The risk of these significant complications were estimated to occur less than 1% of the time. After discussion, the patient has agreed to proceed.    Medication Adjustments/Labs and Tests Ordered: Current medicines are reviewed at length with the patient today.  Concerns regarding medicines are outlined above.  No orders of the defined types were placed in this encounter.  No orders of the defined types were placed in this encounter.   There are no Patient Instructions on file for this visit.   Signed, Sinclair Grooms, MD  04/13/2018 2:20 PM    [place]

## 2018-04-12 NOTE — Patient Instructions (Addendum)
We are scheduling an appointment for you with [facility].  Their cardiology group.  Please see them as soon as you can.  In the meantime, if you experience any chest pain with rest please take 1 nitroglycerin immediately and call an ambulance.  Sorry we were never able to talk about your diabetes very much today would like to discuss it with another time.

## 2018-04-12 NOTE — Assessment & Plan Note (Signed)
Improving control.  A1c today 10.3 from last 12.5.  She continued to take her short acting insulin.  She was again encouraged to discontinue her short acting insulin in favor of other methods of management.  We did not fully discuss medical management of her diabetes today due to concern for her chest pain.  I have asked her to return to revisit diabetes management. -Refilled Lantus, 15 units nightly -Continue Jardiance -Return in 1 month

## 2018-04-12 NOTE — Assessment & Plan Note (Addendum)
Your chest pain that recurs with exertion and improves with rest/nitroglycerin is consistent with stable angina.  This seems to be slowly progressing for the past 5 months.  This does not appear to be rapidly changing nor does it occur at rest.  EKG shows no ST/T wave changes.  I do not think this is a myocardial infarction or unstable angina.  I do think this picture is consistent with stable angina and needs to be assessed by cardiology soon but not emergently. -Referral to cardiology, appointment scheduled for 1/6 -Nitroglycerin refilled -A1c, TSH, lipid panel obtained today -Patient advised to be seen in the ED if she experiences chest pain at rest or rapid worsening of her dyspnea/chest pain on exertion.

## 2018-04-12 NOTE — H&P (View-Only) (Signed)
Cardiology Office Note:    Date:  04/13/2018   ID:  [name], DOB 1946-02-03, MRN [id number]  PCP:  [name], MD  Cardiologist:  No primary care provider on file.   Referring MD: Dickie La, MD   Chief Complaint  Patient presents with  . Coronary Artery Disease    Angina pectoris    History of Present Illness:    [name] is a 73 y.o. female with a hx of multiple cardiac risk factors including type 2 diabetes, hypertension, hyperlipidemia, with prior coronary angiography being referred by Dr. [name] for cardiac evaluation of chest pain.  Low risk myocardial perfusion study August 2015.  The patient is very pleasant 73 year old African-American female with prior history of nonobstructive coronary disease documented by angiography in 2003 by Dr. Christy Sartorius at which time the patient had 20% plaque in LAD, mild diffuse mid and distal LAD disease, eccentric 50% mid stenosis in LAD, mild irregularities in the circumflex, and 20 to 30% mid RCA.  Over the past 3 to 4 months she has had exertional fatigue, discomfort that is in the central chest described as an ache and associated with radiation to the jaw.  If she walks too far she becomes very weak and dyspneic.  She was unable to shop at [facility] for Christmas without becoming breathless and having chest discomfort.  Rare occasions have been associated with discomfort occurring at rest.  She feels fine today.  She has been prescribed nitroglycerin in the past.  She was referred by Dr. Pilar Plate for further cardiac evaluation.  She does not smoke.  She has been diabetic for greater than 20 years.  She has history of hypertension hyperlipidemia and asthma.  Past Medical History:  Diagnosis Date  . Allergy   . Asthma   . Blood transfusion without reported diagnosis 12/23/2004   with colon cancer  . Brain tumor (benign) ([place])   . Colon cancer [facility]) Nov. 27,  2006  . Diabetes mellitus    Type 2  . Female bladder prolapse     . Gastritis   . GERD (gastroesophageal reflux disease)   . Glaucoma   . Hypercholesterolemia   . Hypertension     Past Surgical History:  Procedure Laterality Date  . ABDOMINAL HYSTERECTOMY  1976  . BALLOON DILATION N/A 08/23/2017   Procedure: BALLOON DILATION;  Surgeon: Milus Banister, MD;  Location: [facility];  Service: Endoscopy;  Laterality: N/A;  Esophageal Diltation  . bladder tac    . CARDIAC CATHETERIZATION  2005  . CATARACT EXTRACTION, BILATERAL    . COLONOSCOPY W/ BIOPSIES    . ESOPHAGEAL MANOMETRY N/A 03/20/2015   Procedure: ESOPHAGEAL MANOMETRY (EM);  Surgeon: Gatha Mayer, MD;  Location: [facility];  Service: Endoscopy;  Laterality: N/A;  . ESOPHAGOGASTRODUODENOSCOPY (EGD) WITH PROPOFOL N/A 08/23/2017   Procedure: ESOPHAGOGASTRODUODENOSCOPY (EGD) WITH PROPOFOL;  Surgeon: Milus Banister, MD;  Location: [facility];  Service: Endoscopy;  Laterality: N/A;  . EYE SURGERY     mole removed left eye  . LAPAROSCOPIC NISSEN FUNDOPLICATION  5170  . repair prolapsedbladder    . RIGHT COLECTOMY  Nov. 27, 2006  . TMJ ARTHROPLASTY    . UPPER GASTROINTESTINAL ENDOSCOPY      Current Medications: Current Meds  Medication Sig  . amLODipine (NORVASC) 2.5 MG tablet Take 1 tablet (2.5 mg total) by mouth daily.  Marland Kitchen aspirin 81 MG chewable tablet Chew 81 mg by mouth daily.    Marland Kitchen  Blood Glucose Monitoring Suppl (ONETOUCH VERIO) w/Device KIT 1 each by Does not apply route daily.  . cetirizine (ZYRTEC) 10 MG tablet Take 1 tablet (10 mg total) by mouth daily as needed for allergies.  Marland Kitchen empagliflozin (JARDIANCE) 10 MG TABS tablet Take 10 mg by mouth daily.  Marland Kitchen esomeprazole (NEXIUM) 40 MG packet Take 40 mg by mouth daily before breakfast.  . fluticasone (FLONASE) 50 MCG/ACT nasal spray Place 2 sprays into both nostrils daily. (Patient taking differently: Place 2 sprays into both nostrils daily as needed for allergies. )  . glucose blood (ONETOUCH VERIO) test strip Use as instructed  .  Insulin Glargine (LANTUS SOLOSTAR) 100 UNIT/ML Solostar Pen Inject 15 Units into the skin daily at 10 pm.  . Insulin Pen Needle 31G X 5 MM MISC Check blood sugar once daily  . Lancet Devices (ONE TOUCH DELICA LANCING DEV) MISC Check blood sugar once daily  . nitroGLYCERIN (NITROSTAT) 0.4 MG SL tablet Place 1 tablet (0.4 mg total) under the tongue every 5 (five) minutes as needed. If chest pain not relieved by third tab call 911.  Glory Rosebush DELICA LANCETS 01U MISC CHECK BLOOD SUGAR ONCE DAILY  . quinapril-hydrochlorothiazide (ACCURETIC) 20-12.5 MG tablet Take 2 tablets by mouth daily.  . rosuvastatin (CRESTOR) 20 MG tablet TAKE 1 TABLET BY MOUTH ONCE DAILY  . triamcinolone cream (KENALOG) 0.1 % Apply 1 application topically 2 (two) times daily. (Patient taking differently: Apply 1 application topically 2 (two) times daily as needed (rash). )     Allergies:   Metformin and related   Social History   Socioeconomic History  . Marital status: Legally Separated    Spouse name: Not on file  . Number of children: 2  . Years of education: Not on file  . Highest education level: Not on file  Occupational History  . Not on file  Social Needs  . Financial resource strain: Not on file  . Food insecurity:    Worry: Not on file    Inability: Not on file  . Transportation needs:    Medical: Not on file    Non-medical: Not on file  Tobacco Use  . Smoking status: Never Smoker  . Smokeless tobacco: Never Used  Substance and Sexual Activity  . Alcohol use: No  . Drug use: No  . Sexual activity: Not on file  Lifestyle  . Physical activity:    Days per week: Not on file    Minutes per session: Not on file  . Stress: Not on file  Relationships  . Social connections:    Talks on phone: Not on file    Gets together: Not on file    Attends religious service: Not on file    Active member of club or organization: Not on file    Attends meetings of clubs or organizations: Not on file     Relationship status: Not on file  Other Topics Concern  . Not on file  Social History Narrative  . Not on file     Family History: The patient's family history includes Bone cancer in her father; Diabetes in her daughter; Heart attack in her daughter; Hypertension in her daughter; Lymphoma in an other family member; Suicidality in her son. There is no history of Colon cancer or Stomach cancer.  ROS:   Please see the history of present illness.    Vision disturbance, snoring, constipation, difficulty with balance, excessive fatigue, skipped heartbeats, chest pain, and weight loss.  All other  systems reviewed and are negative.  EKGs/Labs/Other Studies Reviewed:    The following studies were reviewed today: Echocardiogram 08/20/2017: ------------------------------------------------------------------- Study Conclusions  - Left ventricle: The cavity size was normal. There was mild   concentric hypertrophy. Systolic function was normal. The   estimated ejection fraction was in the range of 60% to 65%. Wall   motion was normal; there were no regional wall motion   abnormalities. Doppler parameters are consistent with abnormal   left ventricular relaxation (grade 1 diastolic dysfunction).   Doppler parameters are consistent with elevated ventricular   end-diastolic filling pressure. - Aortic valve: Trileaflet; normal thickness leaflets. There was no   regurgitation. - Mitral valve: There was trivial regurgitation. - Right ventricle: Systolic function was normal. - Right atrium: The atrium was normal in size. - Tricuspid valve: There was mild regurgitation. - Pulmonic valve: There was no regurgitation. - Pulmonary arteries: Systolic pressure was at the upper limits of   normal. - Pericardium, extracardiac: There was no pericardial effusion.  Impressions:  - Normal global longitudinal strain : -22.3%.  EKG:  EKG is formed on 04/12/2018 and demonstrates sinus rhythm, no Q waves, and  prominent T waves.  No ischemic changes noted.  Recent Labs: 08/21/2017: ALT 16 08/22/2017: BUN 21; Creatinine, Ser 1.02; Potassium 3.9; Sodium 142 08/23/2017: Hemoglobin 10.4; Platelets 224 04/12/2018: TSH 0.516  Recent Lipid Panel    Component Value Date/Time   CHOL 84 (L) 04/12/2018 1443   TRIG 123 04/12/2018 1443   HDL 43 04/12/2018 1443   CHOLHDL 2.0 04/12/2018 1443   CHOLHDL 3.8 07/20/2015 1641   VLDL 74 (H) 07/20/2015 1641   LDLCALC 16 04/12/2018 1443    Physical Exam:    VS:  BP (!) 114/54   Pulse 62   Ht _0  (1.626 m)   Wt 129 lb (58.5 kg)   LMP  (Approximate)   SpO2 100%   BMI 22.14 kg/m     Wt Readings from Last 3 Encounters:  04/13/18 129 lb (58.5 kg)  04/12/18 129 lb (58.5 kg)  12/14/17 133 lb (60.3 kg)     GEN: He is older than the stated age. No acute distress HEENT: Normal NECK: No JVD. LYMPHATICS: No lymphadenopathy CARDIAC: RRR.  No murmur, gallop, edema VASCULAR: Pulses are 2+ and symmetric in the radial and carotid.  Posterior tibials also 2+., Bruits are not present RESPIRATORY:  Clear to auscultation without rales, wheezing or rhonchi  ABDOMEN: Soft, non-tender, non-distended, No pulsatile mass, MUSCULOSKELETAL: No deformity  SKIN: Warm and dry NEUROLOGIC:  Alert and oriented x 3 PSYCHIATRIC:  Normal affect   ASSESSMENT:    1. Precordial pain   2. Uncontrolled type 2 diabetes mellitus with hyperglycemia ([address])   3. HYPERTENSION, BENIGN ESSENTIAL   4. HYPERTRIGLYCERIDEMIA   5. Coronary artery disease of native heart with stable angina pectoris, unspecified vessel or lesion type ([place])   6. Stable angina (HCC)    PLAN:    In order of problems listed above:  1. 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  Patient has known nonobstructive disease and longstanding diabetes mellitus.  Given the nature of the patient's symptoms, I am prescribing sublingual nitroglycerin.  She is instructed on use.  Blood pressure is relatively low and heart rate is  62 bpm.  I have recommended that she undergo coronary angiography to define anatomy.  I believe this will likely demonstrate significant progression since her last coronary angiogram in 2008.  The procedure including the risk of stroke, death,  myocardial infarction, emergency surgery, radiation injury, kidney injury, bleeding, and others have been discussed in detail with the patient and accepted. 2. Hemoglobin A 1C target should be less than 7. 3. Target blood pressure 130/80 mmHg or less. 4. May need to consider icosapent.  Overall education and awareness concerning primary/secondary risk prevention was discussed in detail: LDL less than 70, hemoglobin A1c less than 7, blood pressure target less than 130/80 mmHg, >150 minutes of moderate aerobic activity per week, avoidance of smoking, weight control (via diet and exercise), and continued surveillance/management of/for obstructive sleep apnea.  The patient was counseled to undergo left heart catheterization, coronary angiography, and possible percutaneous coronary intervention with stent implantation. The procedural risks and benefits were discussed in detail. The risks discussed included death, stroke, myocardial infarction, life-threatening bleeding, limb ischemia, kidney injury, allergy, and possible emergency cardiac surgery. The risk of these significant complications were estimated to occur less than 1% of the time. After discussion, the patient has agreed to proceed.    Medication Adjustments/Labs and Tests Ordered: Current medicines are reviewed at length with the patient today.  Concerns regarding medicines are outlined above.  No orders of the defined types were placed in this encounter.  No orders of the defined types were placed in this encounter.   There are no Patient Instructions on file for this visit.   Signed, Sinclair Grooms, MD  04/13/2018 2:20 PM    [place]

## 2018-04-12 NOTE — Progress Notes (Signed)
Subjective:  [name] is a 73 y.o. female who presents to the [facility] today with a chief complaint of chest pain.   HPI: New onset stable angina [name] is concerned about the chest pain she has been feeling for the past 5 months.  She reports a dull pressing pain that starts in her left middle chest and radiates to her left arm.  The pain is accompanied by shortness of breath which can be so severe that she needs to stop her activity to rest/sit down.  She notices this chest pain only on exertion and appears to improve with rest.  She does not experience this chest pain at rest.  She has a history of CAD and has been using her nitroglycerin frequently.  She has had to cut back on walking lately.  Prior to 5 months ago, she enjoyed walking around her block/neighborhood which she no longer does due to fear of this chest pain and inability to return home.  She reports that there is a stop sign short distance from her home which she could easily pass 1 month ago.  She can no longer walk as far as the stop sign that chest pain.  She does not notice palpitations.  She does not awaken at night short of breath.  She sleeps with one pillow.  I have not found documentation of the study but multiple notes have reported a cardiac catheterization on 04/03/2007 that showed stenosis of multiple arteries: LAD 30%, circumflex 30%, PDA 50%.  Diabetes This visit was initially scheduled for follow-up for her diabetes.  Her A1c appears to be improving today compared to previous visit (10.3 from 12.5).  She recently started Jardiance in 12/2017.  At that visit she was told to stop taking insulin aspart.  She reports that she has continued to take 5 units insulin aspart with meals.  In addition to her short acting insulin, she takes 15 units of Lantus at night.  Chief Complaint noted Review of Symptoms - see HPI PMH - Smoking status noted.    Objective:  Physical Exam: BP 112/60   Pulse 74   Temp 98.3 F  (36.8 C) (Oral)   Ht 5\' 4"  (1.626 m)   Wt 129 lb (58.5 kg)   BMI 22.14 kg/m    Gen: NAD, resting comfortably.  Not actively complaining of chest pain. CV: RRR with no murmurs appreciated.  Mild tenderness with palpation of upper chest.  No elevated JVD appreciated Pulm: NWOB, CTAB with no crackles, wheezes, or rhonchi GI: Normal bowel sounds present. Soft, Nontender, Nondistended. Skin: warm, dry Neuro: grossly normal, moves all extremities Psych: Normal affect and thought content  Results for orders placed or performed in visit on 04/12/18 (from the past 72 hour(s))  HgB A1c     Status: Abnormal   Collection Time: 04/12/18  1:48 PM  Result Value Ref Range   Hemoglobin A1C     HbA1c POC (<> result, manual entry)     HbA1c, POC (prediabetic range)     HbA1c, POC (controlled diabetic range) 10.3 (A) 0.0 - 7.0 %   EKG done in clinic today shows no ST changes, Q waves, T wave inversions.  Assessment/Plan:  Stable angina (HCC) Your chest pain that recurs with exertion and improves with rest/nitroglycerin is consistent with stable angina.  This seems to be slowly progressing for the past 5 months.  This does not appear to be rapidly changing nor does it occur at rest.  EKG shows no ST/T wave changes.  I do not think this is a myocardial infarction or unstable angina.  I do think this picture is consistent with stable angina and needs to be assessed by cardiology soon but not emergently. -Referral to cardiology, appointment scheduled for 1/6 -Nitroglycerin refilled -A1c, TSH, lipid panel obtained today -Patient advised to be seen in the ED if she experiences chest pain at rest or rapid worsening of her dyspnea/chest pain on exertion.  DM (diabetes mellitus), type 2, uncontrolled ([place]) Improving control.  A1c today 10.3 from last 12.5.  She continued to take her short acting insulin.  She was again encouraged to discontinue her short acting insulin in favor of other methods of management.   We did not fully discuss medical management of her diabetes today due to concern for her chest pain.  I have asked her to return to revisit diabetes management. -Refilled Lantus, 15 units nightly -Continue Jardiance -Return in 1 month

## 2018-04-13 ENCOUNTER — Ambulatory Visit (INDEPENDENT_AMBULATORY_CARE_PROVIDER_SITE_OTHER): Payer: Medicare Other | Admitting: Interventional Cardiology

## 2018-04-13 ENCOUNTER — Encounter: Payer: Self-pay | Admitting: Interventional Cardiology

## 2018-04-13 VITALS — BP 114/54 | HR 62 | Ht 64.0 in | Wt 129.0 lb

## 2018-04-13 DIAGNOSIS — I208 Other forms of angina pectoris: Secondary | ICD-10-CM | POA: Diagnosis not present

## 2018-04-13 DIAGNOSIS — E781 Pure hyperglyceridemia: Secondary | ICD-10-CM | POA: Diagnosis not present

## 2018-04-13 DIAGNOSIS — I10 Essential (primary) hypertension: Secondary | ICD-10-CM | POA: Diagnosis not present

## 2018-04-13 DIAGNOSIS — E1165 Type 2 diabetes mellitus with hyperglycemia: Secondary | ICD-10-CM | POA: Diagnosis not present

## 2018-04-13 DIAGNOSIS — I25118 Atherosclerotic heart disease of native coronary artery with other forms of angina pectoris: Secondary | ICD-10-CM

## 2018-04-13 DIAGNOSIS — R072 Precordial pain: Secondary | ICD-10-CM | POA: Diagnosis not present

## 2018-04-13 LAB — TSH: TSH: 0.516 u[IU]/mL (ref 0.450–4.500)

## 2018-04-13 LAB — LIPID PANEL
Chol/HDL Ratio: 2 ratio (ref 0.0–4.4)
Cholesterol, Total: 84 mg/dL — ABNORMAL LOW (ref 100–199)
HDL: 43 mg/dL (ref >39)
LDL Calculated: 16 mg/dL (ref 0–99)
Triglycerides: 123 mg/dL (ref 0–149)
VLDL Cholesterol Cal: 25 mg/dL (ref 5–40)

## 2018-04-13 MED ORDER — NITROGLYCERIN 0.4 MG SL SUBL: 0.4000 mg | SUBLINGUAL_TABLET | SUBLINGUAL | 0 refills | Status: DC | PRN

## 2018-04-13 NOTE — Patient Instructions (Addendum)
Medication Instructions:  Your physician recommends that you continue on your current medications as directed. Please refer to the Current Medication list given to you today.  If you need a refill on your cardiac medications before your next appointment, please call your pharmacy.   Lab work: BMET and CBC today  If you have labs (blood work) drawn today and your tests are completely normal, you will receive your results only by: Marland Kitchen MyChart Message (if you have MyChart) OR . A paper copy in the mail If you have any lab test that is abnormal or we need to change your treatment, we will call you to review the results.  Testing/Procedures: Your physician has requested that you have a cardiac catheterization. Cardiac catheterization is used to diagnose and/or treat various heart conditions. Doctors may recommend this procedure for a number of different reasons. The most common reason is to evaluate chest pain. Chest pain can be a symptom of coronary artery disease (CAD), and cardiac catheterization can show whether plaque is narrowing or blocking your heart's arteries. This procedure is also used to evaluate the valves, as well as measure the blood flow and oxygen levels in different parts of your heart. For further information please visit [email or web address][facility][email or web address]. Please follow instruction sheet, as given.   Follow-Up: At [facility], you and your health needs are our priority.  As part of our continuing mission to provide you with exceptional heart care, we have created designated Provider Care Teams.  These Care Teams include your primary Cardiologist (physician) and Advanced Practice Providers (APPs -  Physician Assistants and Nurse Practitioners) who all work together to provide you with the care you need, when you need it. You will need a follow up appointment in 2-3 weeks after your cath.  Please call our office 2 months in advance to schedule this appointment.  You may see Dr. Tamala Julian or one of the  following Advanced Practice Providers on your designated Care Team:   Truitt Merle, NP Cecilie Kicks, NP . Kathyrn Drown, NP  Any Other Special Instructions Will Be Listed Below (If Applicable).     [place] OFFICE [place], [place] [address] Dept: [phone] Loc: [place]  04/13/2018  You are scheduled for a Cardiac Catheterization on Tuesday, January 7 with Dr. Daneen Schick.  1. Please arrive at the [facility] (Main Entrance A) at [facility]: [address] [place], [place] [address] at 7:00 AM (This time is two hours before your procedure to ensure your preparation). Free valet parking service is available.   Special note: Every effort is made to have your procedure done on time. Please understand that emergencies sometimes delay scheduled procedures.  2. Diet: Do not eat solid foods after midnight.  The patient may have clear liquids until 5am upon the day of the procedure.  3. Labs: You will have labs drawn today  4. Medication instructions in preparation for your procedure:   Contrast Allergy: No  You will need to hold your Quinopril/HCTZ the day before and the morning of your procedure.  Hold your Jardiance the morning of the procedure.  Take only a half dose of your Lantus the night before your cath.  On the morning of your procedure, take your Aspirin and any morning medicines NOT listed above.  You may use sips of water.  5. Plan for one night stay--bring personal belongings. 6. Bring a  current list of your medications and current insurance cards. 7. You MUST have a responsible person to drive you home. 8. Someone MUST be with you the first 24 hours after you arrive home or your discharge will be delayed. 9. Please wear clothes that are easy to get on and off and wear slip-on shoes.  Thank you for allowing [place] to care for you!   -- [facility]

## 2018-04-14 LAB — BASIC METABOLIC PANEL
BUN/Creatinine Ratio: 29 — ABNORMAL HIGH (ref 12–28)
BUN: 44 mg/dL — ABNORMAL HIGH (ref 8–27)
CO2: 24 mmol/L (ref 20–29)
Calcium: 10.1 mg/dL (ref 8.7–10.3)
Chloride: 97 mmol/L (ref 96–106)
Creatinine, Ser: 1.52 mg/dL — ABNORMAL HIGH (ref 0.57–1.00)
GFR calc Af Amer: 39 mL/min/{1.73_m2} — ABNORMAL LOW (ref >59)
GFR calc non Af Amer: 34 mL/min/{1.73_m2} — ABNORMAL LOW (ref >59)
Glucose: 301 mg/dL — ABNORMAL HIGH (ref 65–99)
Potassium: 3.5 mmol/L (ref 3.5–5.2)
Sodium: 140 mmol/L (ref 134–144)

## 2018-04-14 LAB — CBC
Hematocrit: 32.8 % — ABNORMAL LOW (ref 34.0–46.6)
Hemoglobin: 10.8 g/dL — ABNORMAL LOW (ref 11.1–15.9)
MCH: 27.5 pg (ref 26.6–33.0)
MCHC: 32.9 g/dL (ref 31.5–35.7)
MCV: 84 fL (ref 79–97)
Platelets: 255 10*3/uL (ref 150–450)
RBC: 3.93 x10E6/uL (ref 3.77–5.28)
RDW: 12.8 % (ref 12.3–15.4)
WBC: 7.7 10*3/uL (ref 3.4–10.8)

## 2018-04-16 ENCOUNTER — Telehealth: Payer: Self-pay | Admitting: *Deleted

## 2018-04-16 NOTE — Telephone Encounter (Signed)
Pt contacted pre-catheterization scheduled at [facility] for: Tuesday April 17, 2018 9 AM Verified arrival time and place: [place] Entrance A at: 6 AM-2 hours pre procedure hydration per Dr Tamala Julian.  No solid food after midnight prior to cath, clear liquids until 5 AM day of procedure. Contrast allergy: no  Hold: Quinapril-HCTZ-day before and day of procedure. Jardiance-AM of procedure. 1/2 Insulin PM prior to procedure.  Insulin-AM of procedure.   Except hold medications AM meds can be  taken pre-cath with sip of water including: ASA 81 mg  Confirmed patient has responsible person to drive home post procedure and for 24 hours after you arrive home: yes

## 2018-04-17 ENCOUNTER — Other Ambulatory Visit: Payer: Self-pay

## 2018-04-17 ENCOUNTER — Encounter (HOSPITAL_COMMUNITY)
Admission: RE | Disposition: A | Discharge status: Home / Self Care | Payer: Self-pay | Source: Home / Self Care | Attending: Interventional Cardiology

## 2018-04-17 ENCOUNTER — Ambulatory Visit (HOSPITAL_COMMUNITY)
Admission: RE | Admit: 2018-04-17 | Discharge: 2018-04-17 | Disposition: A | Discharge status: Home / Self Care | Payer: Medicare Other | Attending: Interventional Cardiology | Admitting: Interventional Cardiology

## 2018-04-17 DIAGNOSIS — I10 Essential (primary) hypertension: Secondary | ICD-10-CM | POA: Insufficient documentation

## 2018-04-17 DIAGNOSIS — E785 Hyperlipidemia, unspecified: Secondary | ICD-10-CM | POA: Diagnosis not present

## 2018-04-17 DIAGNOSIS — E1122 Type 2 diabetes mellitus with diabetic chronic kidney disease: Secondary | ICD-10-CM | POA: Diagnosis present

## 2018-04-17 DIAGNOSIS — K219 Gastro-esophageal reflux disease without esophagitis: Secondary | ICD-10-CM | POA: Diagnosis not present

## 2018-04-17 DIAGNOSIS — Z833 Family history of diabetes mellitus: Secondary | ICD-10-CM | POA: Insufficient documentation

## 2018-04-17 DIAGNOSIS — Z8249 Family history of ischemic heart disease and other diseases of the circulatory system: Secondary | ICD-10-CM | POA: Diagnosis not present

## 2018-04-17 DIAGNOSIS — I25119 Atherosclerotic heart disease of native coronary artery with unspecified angina pectoris: Secondary | ICD-10-CM | POA: Diagnosis not present

## 2018-04-17 DIAGNOSIS — Z90710 Acquired absence of both cervix and uterus: Secondary | ICD-10-CM | POA: Insufficient documentation

## 2018-04-17 DIAGNOSIS — Z888 Allergy status to other drugs, medicaments and biological substances status: Secondary | ICD-10-CM | POA: Diagnosis not present

## 2018-04-17 DIAGNOSIS — Z79899 Other long term (current) drug therapy: Secondary | ICD-10-CM | POA: Diagnosis not present

## 2018-04-17 DIAGNOSIS — H409 Unspecified glaucoma: Secondary | ICD-10-CM | POA: Insufficient documentation

## 2018-04-17 DIAGNOSIS — Z794 Long term (current) use of insulin: Secondary | ICD-10-CM | POA: Insufficient documentation

## 2018-04-17 DIAGNOSIS — E1165 Type 2 diabetes mellitus with hyperglycemia: Secondary | ICD-10-CM | POA: Insufficient documentation

## 2018-04-17 DIAGNOSIS — I2510 Atherosclerotic heart disease of native coronary artery without angina pectoris: Secondary | ICD-10-CM | POA: Diagnosis present

## 2018-04-17 DIAGNOSIS — I209 Angina pectoris, unspecified: Secondary | ICD-10-CM | POA: Diagnosis present

## 2018-04-17 DIAGNOSIS — Z7982 Long term (current) use of aspirin: Secondary | ICD-10-CM | POA: Insufficient documentation

## 2018-04-17 DIAGNOSIS — I208 Other forms of angina pectoris: Secondary | ICD-10-CM | POA: Diagnosis present

## 2018-04-17 HISTORY — PX: LEFT HEART CATH AND CORONARY ANGIOGRAPHY: CATH118249

## 2018-04-17 LAB — GLUCOSE, CAPILLARY: Glucose-Capillary: 135 mg/dL — ABNORMAL HIGH (ref 70–99)

## 2018-04-17 SURGERY — LEFT HEART CATH AND CORONARY ANGIOGRAPHY
Anesthesia: LOCAL

## 2018-04-17 MED ORDER — ACETAMINOPHEN 325 MG PO TABS: 650.0000 mg | ORAL_TABLET | ORAL | Status: DC | PRN

## 2018-04-17 MED ORDER — MIDAZOLAM HCL 2 MG/2ML IJ SOLN
INTRAMUSCULAR | Status: AC
  Filled 2018-04-17: qty 2

## 2018-04-17 MED ORDER — SODIUM CHLORIDE 0.9 % WEIGHT BASED INFUSION
3.0000 mL/kg/h | INTRAVENOUS | Status: AC
  Administered 2018-04-17: 3 mL/kg/h via INTRAVENOUS

## 2018-04-17 MED ORDER — SODIUM CHLORIDE 0.9% FLUSH: 3.0000 mL | INTRAVENOUS | Status: DC | PRN

## 2018-04-17 MED ORDER — SODIUM CHLORIDE 0.9% FLUSH: 3.0000 mL | Freq: Two times a day (BID) | INTRAVENOUS | Status: DC

## 2018-04-17 MED ORDER — HEPARIN SODIUM (PORCINE) 1000 UNIT/ML IJ SOLN
INTRAMUSCULAR | Status: DC | PRN
  Administered 2018-04-17: 3000 [IU] via INTRAVENOUS

## 2018-04-17 MED ORDER — MIDAZOLAM HCL 2 MG/2ML IJ SOLN
INTRAMUSCULAR | Status: DC | PRN
  Administered 2018-04-17: 1 mg via INTRAVENOUS
  Administered 2018-04-17: 0.5 mg via INTRAVENOUS

## 2018-04-17 MED ORDER — METOPROLOL SUCCINATE ER 25 MG PO TB24: 25.0000 mg | ORAL_TABLET | Freq: Every day | ORAL | 11 refills | Status: DC

## 2018-04-17 MED ORDER — FENTANYL CITRATE (PF) 100 MCG/2ML IJ SOLN
INTRAMUSCULAR | Status: DC | PRN
  Administered 2018-04-17 (×2): 25 ug via INTRAVENOUS

## 2018-04-17 MED ORDER — OXYCODONE HCL 5 MG PO TABS: 5.0000 mg | ORAL_TABLET | ORAL | Status: DC | PRN

## 2018-04-17 MED ORDER — SODIUM CHLORIDE 0.9 % IV SOLN: INTRAVENOUS | Status: DC

## 2018-04-17 MED ORDER — SODIUM CHLORIDE 0.9 % IV SOLN: 250.0000 mL | INTRAVENOUS | Status: DC | PRN

## 2018-04-17 MED ORDER — HEPARIN SODIUM (PORCINE) 1000 UNIT/ML IJ SOLN
INTRAMUSCULAR | Status: AC
  Filled 2018-04-17: qty 1

## 2018-04-17 MED ORDER — LIDOCAINE HCL (PF) 1 % IJ SOLN
INTRAMUSCULAR | Status: AC
  Filled 2018-04-17: qty 30

## 2018-04-17 MED ORDER — ONDANSETRON HCL 4 MG/2ML IJ SOLN: 4.0000 mg | Freq: Four times a day (QID) | INTRAMUSCULAR | Status: DC | PRN

## 2018-04-17 MED ORDER — IOHEXOL 350 MG/ML SOLN
INTRAVENOUS | Status: DC | PRN
  Administered 2018-04-17: 60 mL via INTRACARDIAC

## 2018-04-17 MED ORDER — VERAPAMIL HCL 2.5 MG/ML IV SOLN
INTRAVENOUS | Status: AC
  Filled 2018-04-17: qty 2

## 2018-04-17 MED ORDER — HEPARIN (PORCINE) IN NACL 1000-0.9 UT/500ML-% IV SOLN
INTRAVENOUS | Status: AC
  Filled 2018-04-17: qty 1000

## 2018-04-17 MED ORDER — SODIUM CHLORIDE 0.9 % WEIGHT BASED INFUSION: 1.0000 mL/kg/h | INTRAVENOUS | Status: DC

## 2018-04-17 MED ORDER — HEPARIN (PORCINE) IN NACL 1000-0.9 UT/500ML-% IV SOLN
INTRAVENOUS | Status: DC | PRN
  Administered 2018-04-17 (×2): 500 mL

## 2018-04-17 MED ORDER — CLOPIDOGREL BISULFATE 75 MG PO TABS: 75.0000 mg | ORAL_TABLET | Freq: Every day | ORAL | 11 refills | Status: DC

## 2018-04-17 MED ORDER — LIDOCAINE HCL (PF) 1 % IJ SOLN
INTRAMUSCULAR | Status: DC | PRN
  Administered 2018-04-17: 5 mL
  Administered 2018-04-17: 2 mL

## 2018-04-17 MED ORDER — ASPIRIN 81 MG PO CHEW: 81.0000 mg | CHEWABLE_TABLET | ORAL | Status: DC

## 2018-04-17 MED ORDER — ASPIRIN 81 MG PO CHEW: 81.0000 mg | CHEWABLE_TABLET | Freq: Every day | ORAL | Status: DC

## 2018-04-17 MED ORDER — VERAPAMIL HCL 2.5 MG/ML IV SOLN
INTRAVENOUS | Status: DC | PRN
  Administered 2018-04-17: 10 mL via INTRA_ARTERIAL

## 2018-04-17 MED ORDER — FENTANYL CITRATE (PF) 100 MCG/2ML IJ SOLN
INTRAMUSCULAR | Status: AC
  Filled 2018-04-17: qty 2

## 2018-04-17 SURGICAL SUPPLY — 12 items
CATH INFINITI 5 FR JL3.5 (CATHETERS) ×1 IMPLANT
CATH INFINITI JR4 5F (CATHETERS) ×1 IMPLANT
DEVICE RAD COMP TR BAND LRG (VASCULAR PRODUCTS) ×1 IMPLANT
GLIDESHEATH SLEND A-KIT 6F 22G (SHEATH) ×2 IMPLANT
GUIDEWIRE INQWIRE 1.5J.035X260 (WIRE) IMPLANT
INQWIRE 1.5J .035X260CM (WIRE) ×2
KIT HEART LEFT (KITS) ×2 IMPLANT
PACK CARDIAC CATHETERIZATION (CUSTOM PROCEDURE TRAY) ×2 IMPLANT
SHEATH PROBE COVER 6X72 (BAG) ×1 IMPLANT
TRANSDUCER W/STOPCOCK (MISCELLANEOUS) ×2 IMPLANT
TUBING CIL FLEX 10 FLL-RA (TUBING) ×2 IMPLANT
WIRE HI TORQ VERSACORE-J 145CM (WIRE) ×1 IMPLANT

## 2018-04-17 NOTE — CV Procedure (Signed)
   Left heart cath with coronary angiography performed via right radial using real-time vascular ultrasound for access.  Widely patent coronaries with the exception of high-grade, 95% obstruction in the LAD and the apical segment.  Moderate distal circumflex, 50 to 60%.  Diffuse proximal to mid PDA 60%.  Normal LV function with normal hemodynamics.  Up titration of medical therapy.

## 2018-04-17 NOTE — Discharge Instructions (Signed)
Radial Site Care ° °This sheet gives you information about how to care for yourself after your procedure. Your health care provider may also give you more specific instructions. If you have problems or questions, contact your health care provider. °What can I expect after the procedure? °After the procedure, it is common to have: °· Bruising and tenderness at the catheter insertion area. °Follow these instructions at home: °Medicines °· Take over-the-counter and prescription medicines only as told by your health care provider. °Insertion site care °· Follow instructions from your health care provider about how to take care of your insertion site. Make sure you: °? Wash your hands with soap and water before you change your bandage (dressing). If soap and water are not available, use hand sanitizer. °? Change your dressing as told by your health care provider. °? Leave stitches (sutures), skin glue, or adhesive strips in place. These skin closures may need to stay in place for 2 weeks or longer. If adhesive strip edges start to loosen and curl up, you may trim the loose edges. Do not remove adhesive strips completely unless your health care provider tells you to do that. °· Check your insertion site every day for signs of infection. Check for: °? Redness, swelling, or pain. °? Fluid or blood. °? Pus or a bad smell. °? Warmth. °· Do not take baths, swim, or use a hot tub until your health care provider approves. °· You may shower 24-48 hours after the procedure, or as directed by your health care provider. °? Remove the dressing and gently wash the site with plain soap and water. °? Pat the area dry with a clean towel. °? Do not rub the site. That could cause bleeding. °· Do not apply powder or lotion to the site. °Activity ° °· For 24 hours after the procedure, or as directed by your health care provider: °? Do not flex or bend the affected arm. °? Do not push or pull heavy objects with the affected arm. °? Do not  drive yourself home from the hospital or clinic. You may drive 24 hours after the procedure unless your health care provider tells you not to. °? Do not operate machinery or power tools. °· Do not lift anything that is heavier than 10 lb (4.5 kg), or the limit that you are told, until your health care provider says that it is safe. °· Ask your health care provider when it is okay to: °? Return to work or school. °? Resume usual physical activities or sports. °? Resume sexual activity. °General instructions °· If the catheter site starts to bleed, raise your arm and put firm pressure on the site. If the bleeding does not stop, get help right away. This is a medical emergency. °· If you went home on the same day as your procedure, a responsible adult should be with you for the first 24 hours after you arrive home. °· Keep all follow-up visits as told by your health care provider. This is important. °Contact a health care provider if: °· You have a fever. °· You have redness, swelling, or yellow drainage around your insertion site. °Get help right away if: °· You have unusual pain at the radial site. °· The catheter insertion area swells very fast. °· The insertion area is bleeding, and the bleeding does not stop when you hold steady pressure on the area. °· Your arm or hand becomes pale, cool, tingly, or numb. °These symptoms may represent a serious problem   that is an emergency. Do not wait to see if the symptoms will go away. Get medical help right away. Call your local emergency services (911 in the [place] Do not drive yourself to the hospital. °Summary °· After the procedure, it is common to have bruising and tenderness at the site. °· Follow instructions from your health care provider about how to take care of your radial site wound. Check the wound every day for signs of infection. °· Do not lift anything that is heavier than 10 lb (4.5 kg), or the limit that you are told, until your health care provider says  that it is safe. °This information is not intended to replace advice given to you by your health care provider. Make sure you discuss any questions you have with your health care provider. °Document Released: 04/30/2010 Document Revised: 05/03/2017 Document Reviewed: 05/03/2017 °Elsevier Interactive Patient Education © 2019 [employer] ° °

## 2018-04-17 NOTE — Interval H&P Note (Signed)
Cath Lab Visit (complete for each Cath Lab visit)  Clinical Evaluation Leading to the Procedure:   ACS: No.  Non-ACS:    Anginal Classification: CCS [name]  Anti-ischemic medical therapy: Minimal Therapy (1 class of medications)  Non-Invasive Test Results: No non-invasive testing performed  Prior CABG: No previous CABG      History and Physical Interval Note:  04/17/2018 11:29 AM  [name]  has presented today for surgery, with the diagnosis of ua  The various methods of treatment have been discussed with the patient and family. After consideration of risks, benefits and other options for treatment, the patient has consented to  Procedure(s): LEFT HEART CATH AND CORONARY ANGIOGRAPHY (N/A) as a surgical intervention .  The patient's history has been reviewed, patient examined, no change in status, stable for surgery.  I have reviewed the patient's chart and labs.  Questions were answered to the patient's satisfaction.     [name]

## 2018-04-18 ENCOUNTER — Encounter (HOSPITAL_COMMUNITY): Payer: Self-pay | Admitting: Interventional Cardiology

## 2018-04-30 ENCOUNTER — Other Ambulatory Visit: Payer: Self-pay

## 2018-04-30 DIAGNOSIS — I208 Other forms of angina pectoris: Secondary | ICD-10-CM

## 2018-05-02 MED ORDER — NITROGLYCERIN 0.4 MG SL SUBL: 0.4000 mg | SUBLINGUAL_TABLET | SUBLINGUAL | 0 refills | Status: DC | PRN

## 2018-05-08 ENCOUNTER — Encounter: Payer: Self-pay | Admitting: Interventional Cardiology

## 2018-05-08 ENCOUNTER — Ambulatory Visit (INDEPENDENT_AMBULATORY_CARE_PROVIDER_SITE_OTHER): Payer: Medicare Other | Admitting: Interventional Cardiology

## 2018-05-08 VITALS — BP 180/80 | HR 42 | Ht 64.0 in | Wt 129.6 lb

## 2018-05-08 DIAGNOSIS — I10 Essential (primary) hypertension: Secondary | ICD-10-CM | POA: Diagnosis not present

## 2018-05-08 DIAGNOSIS — E781 Pure hyperglyceridemia: Secondary | ICD-10-CM | POA: Diagnosis not present

## 2018-05-08 DIAGNOSIS — E1165 Type 2 diabetes mellitus with hyperglycemia: Secondary | ICD-10-CM

## 2018-05-08 DIAGNOSIS — I25118 Atherosclerotic heart disease of native coronary artery with other forms of angina pectoris: Secondary | ICD-10-CM

## 2018-05-08 MED ORDER — METOPROLOL SUCCINATE ER 25 MG PO TB24: 12.5000 mg | ORAL_TABLET | Freq: Every day | ORAL | 11 refills | Status: DC

## 2018-05-08 MED ORDER — AMLODIPINE BESYLATE 5 MG PO TABS: 5.0000 mg | ORAL_TABLET | Freq: Every day | ORAL | 3 refills | Status: DC

## 2018-05-08 NOTE — Progress Notes (Signed)
Cardiology Office Note:    Date:  05/08/2018   ID:  [name], DOB [birth year], MRN [id number]  PCP:  Anne Haymaker, MD  Cardiologist:  Anne Grooms, MD   Referring MD: Anne Haymaker, MD   Chief Complaint  Patient presents with  . Coronary Artery Disease    History of Present Illness:    [name] is a 73 y.o. female with a hx of type 2 diabetes, hypertension, hyperlipidemia, with prior coronary angiography being referred by Dr. Matilde [name] for cardiac evaluation of chest pain.  Low risk myocardial perfusion study August 2015.   Feels about the same.  Makes [place] aware that Accupril HCTZ was never resumed after coronary angiogram.  Still having chest discomfort, sometimes at rest.  Results of coronary angiogram were reviewed with the patient.  She has tight distal LAD disease.  We feel that medical therapy is the best treatment option.  Has had some pain at rest and use nitroglycerin with relief.  She is tolerating the current medical regimen without difficulty.  Denies orthopnea, and lower extremity swelling.  She has not had syncope.  She denies palpitations.   Past Medical History:  Diagnosis Date  . Allergy   . Asthma   . Blood transfusion without reported diagnosis 12/23/2004   with colon cancer  . Brain tumor (benign) ([place])   . Colon cancer [facility]) Nov. 27,  2006  . Diabetes mellitus    Type 2  . Female bladder prolapse   . Gastritis   . GERD (gastroesophageal reflux disease)   . Glaucoma   . Hypercholesterolemia   . Hypertension     Past Surgical History:  Procedure Laterality Date  . ABDOMINAL HYSTERECTOMY  1976  . BALLOON DILATION N/A 08/23/2017   Procedure: BALLOON DILATION;  Surgeon: Anne Banister, MD;  Location: [facility];  Service: Endoscopy;  Laterality: N/A;  Esophageal Diltation  . bladder tac    . CARDIAC CATHETERIZATION  2005  . CATARACT EXTRACTION, BILATERAL    . COLONOSCOPY W/ BIOPSIES    . ESOPHAGEAL MANOMETRY N/A 03/20/2015   Procedure: ESOPHAGEAL MANOMETRY (EM);  Surgeon: Anne Mayer, MD;  Location: [facility];  Service: Endoscopy;  Laterality: N/A;  . ESOPHAGOGASTRODUODENOSCOPY (EGD) WITH PROPOFOL N/A 08/23/2017   Procedure: ESOPHAGOGASTRODUODENOSCOPY (EGD) WITH PROPOFOL;  Surgeon: Anne Banister, MD;  Location: [facility];  Service: Endoscopy;  Laterality: N/A;  . EYE SURGERY     mole removed left eye  . LAPAROSCOPIC NISSEN FUNDOPLICATION  8889  . LEFT HEART CATH AND CORONARY ANGIOGRAPHY N/A 04/17/2018   Procedure: LEFT HEART CATH AND CORONARY ANGIOGRAPHY;  Surgeon: Anne Crome, MD;  Location: [facility];  Service: Cardiovascular;  Laterality: N/A;  . repair prolapsedbladder    . RIGHT COLECTOMY  Nov. 27, 2006  . TMJ ARTHROPLASTY    . UPPER GASTROINTESTINAL ENDOSCOPY      Current Medications: Current Meds  Medication Sig  . aspirin 81 MG chewable tablet Chew 81 mg by mouth daily.    . Blood Glucose Monitoring Suppl (ONETOUCH VERIO) w/Device KIT 1 each by Does not apply route daily.  . clopidogrel (PLAVIX) 75 MG tablet Take 1 tablet (75 mg total) by mouth daily.  . empagliflozin (JARDIANCE) 10 MG TABS tablet Take 10 mg by mouth daily.  Marland Kitchen esomeprazole (NEXIUM) 40 MG packet Take 40 mg by mouth daily before breakfast.  . glucose blood (ONETOUCH VERIO) test strip Use as instructed  . Insulin Glargine (LANTUS  SOLOSTAR) 100 UNIT/ML Solostar Pen Inject 15 Units into the skin daily at 10 pm.  . Insulin Pen Needle 31G X 5 MM MISC Check blood sugar once daily  . Lancet Devices (ONE TOUCH DELICA LANCING DEV) MISC Check blood sugar once daily  . metoprolol succinate (TOPROL XL) 25 MG 24 hr tablet Take 0.5 tablets (12.5 mg total) by mouth daily.  . nitroGLYCERIN (NITROSTAT) 0.4 MG SL tablet Place 1 tablet (0.4 mg total) under the tongue every 5 (five) minutes as needed. If chest pain not relieved by third tab call 911.  [name] DELICA LANCETS 63K MISC CHECK BLOOD SUGAR ONCE DAILY  . rosuvastatin  (CRESTOR) 20 MG tablet TAKE 1 TABLET BY MOUTH ONCE DAILY  . [DISCONTINUED] amLODipine (NORVASC) 2.5 MG tablet Take 1 tablet (2.5 mg total) by mouth daily.  . [DISCONTINUED] metoprolol succinate (TOPROL XL) 25 MG 24 hr tablet Take 1 tablet (25 mg total) by mouth daily.     Allergies:   Metformin and related   Social History   Socioeconomic History  . Marital status: Legally Separated    Spouse name: Not on file  . Number of children: 2  . Years of education: Not on file  . Highest education level: Not on file  Occupational History  . Not on file  Social Needs  . Financial resource strain: Not on file  . Food insecurity:    Worry: Not on file    Inability: Not on file  . Transportation needs:    Medical: Not on file    Non-medical: Not on file  Tobacco Use  . Smoking status: Never Smoker  . Smokeless tobacco: Never Used  Substance and Sexual Activity  . Alcohol use: No  . Drug use: No  . Sexual activity: Not on file  Lifestyle  . Physical activity:    Days per week: Not on file    Minutes per session: Not on file  . Stress: Not on file  Relationships  . Social connections:    Talks on phone: Not on file    Gets together: Not on file    Attends religious service: Not on file    Active member of club or organization: Not on file    Attends meetings of clubs or organizations: Not on file    Relationship status: Not on file  Other Topics Concern  . Not on file  Social History Narrative  . Not on file     Family History: The patient's family history includes Bone cancer in her father; Diabetes in her daughter; Heart attack in her daughter; Hypertension in her daughter; Lymphoma in an other family member; Suicidality in her son. There is no history of Colon cancer or Stomach cancer.  ROS:   Please see the history of present illness.    No new complaints other than depression and worry.  She revealed to me that her son committed suicide 15 years ago and she has never  quite gotten over that.  States this is when all of her health problems began.  All other systems reviewed and are negative.  EKGs/Labs/Other Studies Reviewed:    The following studies were reviewed today: Cardiac catheterization April 17, 2018:  90% apical LAD.  The LAD contains luminal irregularities otherwise up to 30% in the mid vessel.  Normal left main.  50 to 70% distal circumflex before the dominant obtuse marginal.  50 to 60% segmental mid RCA.  60 to 70% diffuse mid PDA segment.  Normal LV function with EF 60%.  LVEDP was low.  RECOMMENDATIONS:   Coronary artery disease with symptoms likely being driven by apical high-grade LAD obstruction.  Distal location and moderate disease in other territories mandate medical therapy.  We will add low-dose beta-blocker therapy and Plavix.  Plavix should be continued for 6 to 12 months.  Sublingual nitroglycerin for recurrent episodes of chest pain.  Resume ACE inhibitor/diuretic combo therapy tomorrow.  2 additional hours of hydration with 150 cc/h of saline before discharge.  Coronary angiography 04/17/2018: Diagnostic  Dominance: Right     EKG:  EKG  Not done.  Recent Labs: 08/21/2017: ALT 16 04/12/2018: TSH 0.516 04/13/2018: BUN 44; Creatinine, Ser 1.52; Hemoglobin 10.8; Platelets 255; Potassium 3.5; Sodium 140  Recent Lipid Panel    Component Value Date/Time   CHOL 84 (L) 04/12/2018 1443   TRIG 123 04/12/2018 1443   HDL 43 04/12/2018 1443   CHOLHDL 2.0 04/12/2018 1443   CHOLHDL 3.8 07/20/2015 1641   VLDL 74 (H) 07/20/2015 1641   LDLCALC 16 04/12/2018 1443    Physical Exam:    VS:  BP (!) 180/80   Pulse (!) 42   Ht '5\' 4"'$  (1.626 m)   Wt 129 lb 9.6 oz (58.8 kg)   SpO2 100%   BMI 22.25 kg/m     Wt Readings from Last 3 Encounters:  05/08/18 129 lb 9.6 oz (58.8 kg)  04/17/18 129 lb (58.5 kg)  04/13/18 129 lb (58.5 kg)     GEN: Appears younger than stated. No acute distress HEENT: Normal NECK: No  JVD. LYMPHATICS: No lymphadenopathy CARDIAC: RRR.  No murmur, no gallop, no edema VASCULAR: Bilateral 1-2+ radial and carotid pulses, no bruits RESPIRATORY:  Clear to auscultation without rales, wheezing or rhonchi  ABDOMEN: Soft, non-tender, non-distended, No pulsatile mass, MUSCULOSKELETAL: No deformity  SKIN: Warm and dry NEUROLOGIC:  Alert and oriented x 3 PSYCHIATRIC:  Normal affect   ASSESSMENT:    1. Coronary artery disease of native heart with stable angina pectoris, unspecified vessel or lesion type ([place])   2. HYPERTENSION, BENIGN ESSENTIAL   3. Uncontrolled type 2 diabetes mellitus with hyperglycemia ([place])   4. HYPERTRIGLYCERIDEMIA    PLAN:    In order of problems listed above:  1. Stable but with continued angina.  Heart rate is too slow to titrate Toprol-XL, and in fact will decrease the dose to 12.5 mg/day.  Blood pressure is not well controlled as Accuretic was not resumed after the coronary angiogram because of concerns about kidney function. 2. Blood pressure is 180/80 mmHg.  Increase amlodipine to 5 mg/day.  ACE/diuretic was not resumed after coronary angiography.  Recheck basic metabolic panel today and make decision about resuming ACE inhibitor therapy. 3. Not addressed 4. Elevated triglycerides but the patient is unable to to afford Icosapent ethyl LDL is 16 on high intensity rosuvastatin.  Clinical follow-up 1 month with team member  Basic metabolic panel today to determine if safe to resume ACE inhibitor therapy  Clinical follow-up with me in 6 months.  Go for coronary disease is medical therapy.  If up titration of amlodipine does not help angina, consider adding Ranexa.   Medication Adjustments/Labs and Tests Ordered: Current medicines are reviewed at length with the patient today.  Concerns regarding medicines are outlined above.  Orders Placed This Encounter  Procedures  . Basic metabolic panel   Meds ordered this encounter  Medications  .  metoprolol succinate (TOPROL XL) 25 MG 24 hr tablet  Sig: Take 0.5 tablets (12.5 mg total) by mouth daily.    Dispense:  15 tablet    Refill:  11    Dose change  . amLODipine (NORVASC) 5 MG tablet    Sig: Take 1 tablet (5 mg total) by mouth daily.    Dispense:  90 tablet    Refill:  3    Dose change    Patient Instructions  Medication Instructions:  1) DECREASE Metoprolol Succinate to 12.'5mg'$  once daily 2) INCREASE Amlodipine to '5mg'$  once daily  If you need a refill on your cardiac medications before your next appointment, please call your pharmacy.   Lab work: BMET today If you have labs (blood work) drawn today and your tests are completely normal, you will receive your results only by: Marland Kitchen MyChart Message (if you have MyChart) OR . A paper copy in the mail If you have any lab test that is abnormal or we need to change your treatment, we will call you to review the results.  Testing/Procedures: None  Follow-Up: At [facility], you and your health needs are our priority.  As part of our continuing mission to provide you with exceptional heart care, we have created designated Provider Care Teams.  These Care Teams include your primary Cardiologist (physician) and Advanced Practice Providers (APPs -  Physician Assistants and Nurse Practitioners) who all work together to provide you with the care you need, when you need it. You will need a follow up appointment in 1 month with a NP on our team.:   Truitt Merle, NP Cecilie Kicks, NP . Kathyrn Drown, NP  Any Other Special Instructions Will Be Listed Below (If Applicable).       Signed, Anne Grooms, MD  05/08/2018 3:20 PM    [place]

## 2018-05-08 NOTE — Patient Instructions (Signed)
Medication Instructions:  1) DECREASE Metoprolol Succinate to 12.5mg  once daily 2) INCREASE Amlodipine to 5mg  once daily  If you need a refill on your cardiac medications before your next appointment, please call your pharmacy.   Lab work: BMET today If you have labs (blood work) drawn today and your tests are completely normal, you will receive your results only by: Marland Kitchen MyChart Message (if you have MyChart) OR . A paper copy in the mail If you have any lab test that is abnormal or we need to change your treatment, we will call you to review the results.  Testing/Procedures: None  Follow-Up: At [facility], you and your health needs are our priority.  As part of our continuing mission to provide you with exceptional heart care, we have created designated Provider Care Teams.  These Care Teams include your primary Cardiologist (physician) and Advanced Practice Providers (APPs -  Physician Assistants and Nurse Practitioners) who all work together to provide you with the care you need, when you need it. You will need a follow up appointment in 1 month with a NP on our team.:   Anne Merle, NP Anne Kicks, NP . Anne Drown, NP  Any Other Special Instructions Will Be Listed Below (If Applicable).

## 2018-05-09 LAB — BASIC METABOLIC PANEL
BUN/Creatinine Ratio: 30 — ABNORMAL HIGH (ref 12–28)
BUN: 40 mg/dL — ABNORMAL HIGH (ref 8–27)
CO2: 19 mmol/L — ABNORMAL LOW (ref 20–29)
Calcium: 9.9 mg/dL (ref 8.7–10.3)
Chloride: 100 mmol/L (ref 96–106)
Creatinine, Ser: 1.34 mg/dL — ABNORMAL HIGH (ref 0.57–1.00)
GFR calc Af Amer: 46 mL/min/{1.73_m2} — ABNORMAL LOW (ref >59)
GFR calc non Af Amer: 40 mL/min/{1.73_m2} — ABNORMAL LOW (ref >59)
Glucose: 123 mg/dL — ABNORMAL HIGH (ref 65–99)
Potassium: 3.6 mmol/L (ref 3.5–5.2)
Sodium: 140 mmol/L (ref 134–144)

## 2018-05-10 ENCOUNTER — Telehealth: Payer: Self-pay | Admitting: *Deleted

## 2018-05-10 DIAGNOSIS — I10 Essential (primary) hypertension: Secondary | ICD-10-CM

## 2018-05-10 MED ORDER — QUINAPRIL-HYDROCHLOROTHIAZIDE 20-12.5 MG PO TABS: 2.0000 | ORAL_TABLET | Freq: Every day | ORAL | 3 refills | Status: DC

## 2018-05-10 NOTE — Telephone Encounter (Signed)
Spoke with pt and went over results and recommendations per Dr. Tamala Julian.  Pt will come for labs on 2/7.  Pt verbalized understanding and was in agreement with this plan.

## 2018-05-10 NOTE — Telephone Encounter (Signed)
-----   Message from Belva Crome, MD sent at 05/10/2018  4:42 PM EST ----- Let the patient know please advise her to resume Accupril HCTZ as prior to cath.  She will need to have a basic metabolic panel performed 1 week after resuming therapy to reevaluate kidney function. A copy will be sent to [name], MD

## 2018-05-18 ENCOUNTER — Other Ambulatory Visit: Payer: Medicare Other | Admitting: *Deleted

## 2018-05-18 DIAGNOSIS — I10 Essential (primary) hypertension: Secondary | ICD-10-CM

## 2018-05-18 LAB — BASIC METABOLIC PANEL
BUN/Creatinine Ratio: 20 (ref 12–28)
BUN: 29 mg/dL — ABNORMAL HIGH (ref 8–27)
CO2: 21 mmol/L (ref 20–29)
Calcium: 9.8 mg/dL (ref 8.7–10.3)
Chloride: 99 mmol/L (ref 96–106)
Creatinine, Ser: 1.42 mg/dL — ABNORMAL HIGH (ref 0.57–1.00)
GFR calc Af Amer: 43 mL/min/{1.73_m2} — ABNORMAL LOW (ref >59)
GFR calc non Af Amer: 37 mL/min/{1.73_m2} — ABNORMAL LOW (ref >59)
Glucose: 154 mg/dL — ABNORMAL HIGH (ref 65–99)
Potassium: 3.6 mmol/L (ref 3.5–5.2)
Sodium: 138 mmol/L (ref 134–144)

## 2018-05-23 ENCOUNTER — Other Ambulatory Visit: Payer: Self-pay

## 2018-05-23 DIAGNOSIS — I208 Other forms of angina pectoris: Secondary | ICD-10-CM

## 2018-05-24 ENCOUNTER — Ambulatory Visit (INDEPENDENT_AMBULATORY_CARE_PROVIDER_SITE_OTHER): Payer: Medicare Other | Admitting: Family Medicine

## 2018-05-24 ENCOUNTER — Encounter: Payer: Self-pay | Admitting: Family Medicine

## 2018-05-24 ENCOUNTER — Telehealth: Payer: Self-pay | Admitting: Interventional Cardiology

## 2018-05-24 ENCOUNTER — Other Ambulatory Visit: Payer: Self-pay

## 2018-05-24 VITALS — BP 102/48 | HR 57 | Temp 98.4°F | Ht 64.0 in | Wt 127.6 lb

## 2018-05-24 DIAGNOSIS — M19041 Primary osteoarthritis, right hand: Secondary | ICD-10-CM | POA: Diagnosis not present

## 2018-05-24 DIAGNOSIS — E1165 Type 2 diabetes mellitus with hyperglycemia: Secondary | ICD-10-CM | POA: Diagnosis not present

## 2018-05-24 DIAGNOSIS — I10 Essential (primary) hypertension: Secondary | ICD-10-CM | POA: Diagnosis not present

## 2018-05-24 MED ORDER — AMLODIPINE BESYLATE 5 MG PO TABS: 2.5000 mg | ORAL_TABLET | Freq: Every day | ORAL | 3 refills | Status: DC

## 2018-05-24 MED ORDER — NITROGLYCERIN 0.4 MG SL SUBL: 0.4000 mg | SUBLINGUAL_TABLET | SUBLINGUAL | 0 refills | Status: DC | PRN

## 2018-05-24 MED ORDER — DICLOFENAC SODIUM 1 % TD GEL: 2.0000 g | Freq: Four times a day (QID) | TRANSDERMAL | 2 refills | Status: DC

## 2018-05-24 NOTE — Telephone Encounter (Signed)
Pt c/o BP issue: STAT if pt c/o blurred vision, one-sided weakness or slurred speech  1. What are your last 5 BP readings?    102/48  2. Are you having any other symptoms (ex. Dizziness, headache, blurred vision, passed out)? ,weakness when walking, lightheadness  3. What is your BP issue? BP is low. Pt was advised to call Cardiologist by PCP

## 2018-05-24 NOTE — Telephone Encounter (Signed)
Pt called to report that she saw her PMD Dr. Pilar Plate today and her BP was 102/48.Marland Kitchen she has reported having increased dizziness with rising and generally feels tired.. he had advised her to decrease her Amlodipine to 2.5mg  a day from 5mg .... pt advised to increase her fluid intake... she was advised by Dr. Pilar Plate to keep track of her BP the next few days and to let Dr. Tamala Julian know what it is running... If he would like to make any further changes... pt was previously scheduled with Cecilie Kicks NP 06/07/18 but can move it up if Dr. Tamala Julian would like her seen sooner.

## 2018-05-24 NOTE — Assessment & Plan Note (Signed)
Her current blood pressure medications include: Amlodipine 5 mg, metoprolol succinate 12.5 mg, quinapril-hydrochlorothiazide 20-12.5.  Her most recent change to new these medications was made by cardiology who recently increased her amlodipine from 2.5 mg to 5 mg.  She was advised to decrease her amlodipine down to 2.5 mg.  She noted that cardiology wanted to be notified when she had a blood pressure there was in the 100s over 50s.  Blood pressure today was 102/48. -Decrease amlodipine to 2.5 mg daily

## 2018-05-24 NOTE — Assessment & Plan Note (Signed)
Due to history of CAD and GFR of 43, NSAIDs are not advised. -Tylenol OTC as needed -Diclofenac ordered -Continue to monitor

## 2018-05-24 NOTE — Progress Notes (Signed)
Subjective:  [name] is a 73 y.o. female who presents to the [facility] today for a diabetes follow up.   HPI: Blood pressure: [name] reports that she occasionally feels chills/shaking for roughly the past month.  She has trouble explaining exactly what the symptom is but she also notes that she sometimes feels unsteady when moving from a seated to a standing position.  She does not report any headaches or vision changes.  Of note, her blood pressure today is 102/48.  Diabetes: She reports that she checks her blood glucose every morning and does keep a log.  She does not have her log with her today.  She reports that her morning blood glucoses have been around 130 during the past week.  Her A1c 5 months ago was 12.5 and came down slightly to 10.85-month ago.  Her current diabetes medication includes Jardiance 10 mg daily and Lantus 15 units nightly.  She denies dysuria, suprapubic pain, malodorous urine.  She has a newfound appreciation for the importance of diabetes as she needed to be seen rather urgently following her last diabetes appointment due to symptoms of angina.  She knows now that her diabetes has significantly contributed to her coronary artery disease.  Right hand pain: She reports that for roughly the past 3 months, she has had minor pain in 1 of the knuckles of her right pointer finger.  Her daughter noted that it does seem to intermittently become swollen and painful.  [name] noted that she seems to have more difficulty opening doors sometimes or certain bottles of medication.  She has not tried any medication for this at home.  She does not report any pain in other joints.   Chief Complaint noted Review of Symptoms - see HPI PMH - Smoking status noted.    Objective:  Physical Exam: BP (!) 102/48   Pulse (!) 57   Temp 98.4 F (36.9 C) (Oral)   Ht 5\' 4"  (1.626 m)   Wt 127 lb 9.6 oz (57.9 kg)   SpO2 99%   BMI 21.90 kg/m    Gen: Sitting comfortably on the  exam table during her visit.  No acute distress CV: RRR with no murmurs appreciated Pulm: NWOB, CTAB with no crackles, wheezes, or rhonchi GI: Normal bowel sounds present. Soft, Nontender, Nondistended. Extremities: 2+ pitting edema of lower extremities bilaterally.  Strong radial pulse.  No obvious swelling or erythema of right first MCP. Skin: warm, dry   No results found for this or any previous visit (from the past 72 hour(s)).   Assessment/Plan:  DM (diabetes mellitus), type 2, uncontrolled ([place]) Her A1c is downtrending although still significantly elevated at 10.0.  We will recheck her next diabetes follow-up.  She is currently taking Jardiance and Lantus 15 units nightly.  Her GFR of 43 prohibits increasing her Jardiance.  She is encouraged to increase her Lantus as noted below. -Continue Jardiance 10 mg -Continue Lantus 15 units nightly.  For every morning glucose above 150, increase your Lantus by 1 unit for that evening and continue that dose moving forward. -Log your morning blood glucose and bring that with you to your next visit -Follow-up in 3 months  HYPERTENSION, BENIGN ESSENTIAL Her current blood pressure medications include: Amlodipine 5 mg, metoprolol succinate 12.5 mg, quinapril-hydrochlorothiazide 20-12.5.  Her most recent change to new these medications was made by cardiology who recently increased her amlodipine from 2.5 mg to 5 mg.  She was advised to decrease her  amlodipine down to 2.5 mg.  She noted that cardiology wanted to be notified when she had a blood pressure there was in the 100s over 50s.  Blood pressure today was 102/48. -Decrease amlodipine to 2.5 mg daily   Arthritis of finger of right hand Due to history of CAD and GFR of 43, NSAIDs are not advised. -Tylenol OTC as needed -Diclofenac ordered -Continue to monitor

## 2018-05-24 NOTE — Patient Instructions (Addendum)
We talked about a few things today.  Here are the important points:  Blood pressure: -Reduce your Norvasc (amlodipine) to 2.5 mg daily instead of 5 mg  Diabetes: -Check your blood glucose every morning -every morning that your blood glucose is above 150, add one unit to your insulin for that night - DO NOT give more than 25 units/day - keep a log  Joint pain: -Try daily tylenol and the topical gel that I prescribed (Voltaren)  See you in 3 months!

## 2018-05-24 NOTE — Assessment & Plan Note (Signed)
Her A1c is downtrending although still significantly elevated at 10.0.  We will recheck her next diabetes follow-up.  She is currently taking Jardiance and Lantus 15 units nightly.  Her GFR of 43 prohibits increasing her Jardiance.  She is encouraged to increase her Lantus as noted below. -Continue Jardiance 10 mg -Continue Lantus 15 units nightly.  For every morning glucose above 150, increase your Lantus by 1 unit for that evening and continue that dose moving forward. -Log your morning blood glucose and bring that with you to your next visit -Follow-up in 3 months

## 2018-05-26 NOTE — Telephone Encounter (Signed)
Just resumed Accupril Hctz. Okay to stay at low dose or no amlodipine. We will follow BP.

## 2018-05-28 NOTE — Telephone Encounter (Signed)
Spoke with pt and made her aware of recommendations per Dr. Tamala Julian.  Pt states that BPs have been fine since decreasing Amlodipine to 2.5mg .  Advised pt to monitor BP and bring readings to appt on 2/27.  Pt verbalized understanding and was in agreement with this plan.

## 2018-06-07 ENCOUNTER — Ambulatory Visit (INDEPENDENT_AMBULATORY_CARE_PROVIDER_SITE_OTHER): Payer: Medicare Other | Admitting: Cardiology

## 2018-06-07 ENCOUNTER — Encounter: Payer: Self-pay | Admitting: Cardiology

## 2018-06-07 VITALS — BP 120/60 | HR 53 | Ht 64.0 in | Wt 126.1 lb

## 2018-06-07 DIAGNOSIS — I208 Other forms of angina pectoris: Secondary | ICD-10-CM

## 2018-06-07 DIAGNOSIS — I25118 Atherosclerotic heart disease of native coronary artery with other forms of angina pectoris: Secondary | ICD-10-CM | POA: Diagnosis not present

## 2018-06-07 DIAGNOSIS — I10 Essential (primary) hypertension: Secondary | ICD-10-CM

## 2018-06-07 MED ORDER — AMLODIPINE BESYLATE 5 MG PO TABS: 5.0000 mg | ORAL_TABLET | Freq: Every day | ORAL | 3 refills | Status: DC

## 2018-06-07 NOTE — Progress Notes (Signed)
Cardiology Office Note   Date:  06/07/2018   ID:  [name], [name] 09/24/1945, MRN [id number]  PCP:  Anne Haymaker, MD  Cardiologist:  Dr. Tamala [name]    Chief Complaint  Patient presents with  . Hypertension  . Coronary Artery Disease      History of Present Illness: [name] is a 73 y.o. female who presents for HTN and CAD.    hx of type 2 diabetes, hypertension, hyperlipidemia, with prior coronary angiography being referred by Dr. Matilde [name] for cardiac evaluationof chest pain.Low risk myocardial perfusion study August 2015.   On last visit she felt about the same.  Makes [place] aware that Accupril HCTZ was never resumed after coronary angiogram.  Cath 04/17/18 with non obstructive disease plavix added and continue 6-12 mos  Added back ACE diuretic after BMP checked.    On last visit BB decreased norvasc increased to 10 mg  And the Accupril HCTZ was resume.  For HTN.    Since office visit -She began having dizziness and her BP with PCP was 353 systolic so amlodipine was decreased to 2.5 mg daily.  She stil has some mild dizziness though last Sunday she felt like she would pass out - had to lie down on the kitchen table.  She also has prickly chest pain.   Today BP is improved but with above complaints. Her prickly chest pain is different than pain she had cath for.   Past Medical History:  Diagnosis Date  . Allergy   . Asthma   . Blood transfusion without reported diagnosis 12/23/2004   with colon cancer  . Brain tumor (benign) ([place])   . Colon cancer [facility]) Nov. 27,  2006  . Diabetes mellitus    Type 2  . Female bladder prolapse   . Gastritis   . GERD (gastroesophageal reflux disease)   . Glaucoma   . Hypercholesterolemia   . Hypertension     Past Surgical History:  Procedure Laterality Date  . ABDOMINAL HYSTERECTOMY  1976  . BALLOON DILATION N/A 08/23/2017   Procedure: BALLOON DILATION;  Surgeon: Milus Banister, MD;  Location: [facility];  Service:  Endoscopy;  Laterality: N/A;  Esophageal Diltation  . bladder tac    . CARDIAC CATHETERIZATION  2005  . CATARACT EXTRACTION, BILATERAL    . COLONOSCOPY W/ BIOPSIES    . ESOPHAGEAL MANOMETRY N/A 03/20/2015   Procedure: ESOPHAGEAL MANOMETRY (EM);  Surgeon: Gatha Mayer, MD;  Location: [facility];  Service: Endoscopy;  Laterality: N/A;  . ESOPHAGOGASTRODUODENOSCOPY (EGD) WITH PROPOFOL N/A 08/23/2017   Procedure: ESOPHAGOGASTRODUODENOSCOPY (EGD) WITH PROPOFOL;  Surgeon: Milus Banister, MD;  Location: [facility];  Service: Endoscopy;  Laterality: N/A;  . EYE SURGERY     mole removed left eye  . LAPAROSCOPIC NISSEN FUNDOPLICATION  2992  . LEFT HEART CATH AND CORONARY ANGIOGRAPHY N/A 04/17/2018   Procedure: LEFT HEART CATH AND CORONARY ANGIOGRAPHY;  Surgeon: Belva Crome, MD;  Location: [facility];  Service: Cardiovascular;  Laterality: N/A;  . repair prolapsedbladder    . RIGHT COLECTOMY  Nov. 27, 2006  . TMJ ARTHROPLASTY    . UPPER GASTROINTESTINAL ENDOSCOPY       Current Outpatient Medications  Medication Sig Dispense Refill  . amLODipine (NORVASC) 5 MG tablet Take 1 tablet (5 mg total) by mouth daily. 90 tablet 3  . aspirin 81 MG chewable tablet Chew 81 mg by mouth daily.      . Blood Glucose  Monitoring Suppl (ONETOUCH VERIO) w/Device KIT 1 each by Does not apply route daily. 1 kit 0  . clopidogrel (PLAVIX) 75 MG tablet Take 1 tablet (75 mg total) by mouth daily. 30 tablet 11  . diclofenac sodium (VOLTAREN) 1 % GEL Apply 2 g topically 4 (four) times daily. 100 g 2  . empagliflozin (JARDIANCE) 10 MG TABS tablet Take 10 mg by mouth daily. 30 tablet 2  . esomeprazole (NEXIUM) 40 MG packet Take 40 mg by mouth daily before breakfast. 30 each 12  . glucose blood (ONETOUCH VERIO) test strip Use as instructed 100 each 12  . Insulin Glargine (LANTUS SOLOSTAR) 100 UNIT/ML Solostar Pen Inject 15 Units into the skin daily at 10 pm. 5 pen 0  . Insulin Pen Needle 31G X 5 MM MISC Check  blood sugar once daily 100 each 2  . Lancet Devices (ONE TOUCH DELICA LANCING DEV) MISC Check blood sugar once daily 1 each 0  . nitroGLYCERIN (NITROSTAT) 0.4 MG SL tablet Place 1 tablet (0.4 mg total) under the tongue every 5 (five) minutes as needed. If chest pain not relieved by third tab call 911. 30 tablet 0  . ONETOUCH DELICA LANCETS 84O MISC CHECK BLOOD SUGAR ONCE DAILY 100 each 0  . quinapril-hydrochlorothiazide (ACCURETIC) 20-12.5 MG tablet Take 2 tablets by mouth daily. 180 tablet 3  . rosuvastatin (CRESTOR) 20 MG tablet TAKE 1 TABLET BY MOUTH ONCE DAILY 90 tablet 0   No current facility-administered medications for this visit.     Allergies:   Metformin and related    Social History:  The patient  reports that she has never smoked. She has never used smokeless tobacco. She reports that she does not drink alcohol or use drugs.   Family History:  The patient's family history includes Bone cancer in her father; Diabetes in her daughter; Heart attack in her daughter; Hypertension in her daughter; Lymphoma in an other family member; Suicidality in her son.    ROS:  General:no colds or fevers, no weight changes Skin:no rashes or ulcers HEENT:no blurred vision, no congestion CV:see HPI PUL:see HPI GI:no diarrhea constipation or melena, no indigestion GU:no hematuria, no dysuria MS:no joint pain, no claudication Neuro:no syncope, no lightheadedness Endo:+ diabetes, no thyroid disease  Wt Readings from Last 3 Encounters:  06/07/18 126 lb 1.9 oz (57.2 kg)  05/24/18 127 lb 9.6 oz (57.9 kg)  05/08/18 129 lb 9.6 oz (58.8 kg)     PHYSICAL EXAM: VS:  BP 120/60   Pulse (!) 53   Ht '5\' 4"'$  (1.626 m)   Wt 126 lb 1.9 oz (57.2 kg)   SpO2 99%   BMI 21.65 kg/m  , BMI Body mass index is 21.65 kg/m. General:Pleasant affect, NAD Skin:Warm and dry, brisk capillary refill HEENT:normocephalic, sclera clear, mucus membranes moist Neck:supple, no JVD, no bruits  Heart:S1S2 RRR without  murmur, gallup, rub or click Lungs:clear without rales, rhonchi, or wheezes NGE:XBMW, non tender, + BS, do not palpate liver spleen or masses Ext:no lower ext edema, 2+ pedal pulses, 2+ radial pulses Neuro:alert and oriented X 3, MAE, follows commands, + facial symmetry Mild drop in BP with orthostatics.     EKG:  EKG is NOT ordered today.  Recent Labs: 08/21/2017: ALT 16 04/12/2018: TSH 0.516 04/13/2018: Hemoglobin 10.8; Platelets 255 05/18/2018: BUN 29; Creatinine, Ser 1.42; Potassium 3.6; Sodium 138    Lipid Panel    Component Value Date/Time   CHOL 84 (L) 04/12/2018 1443   TRIG 123 04/12/2018  1443   HDL 43 04/12/2018 1443   CHOLHDL 2.0 04/12/2018 1443   CHOLHDL 3.8 07/20/2015 1641   VLDL 74 (H) 07/20/2015 1641   LDLCALC 16 04/12/2018 1443       Other studies Reviewed: Additional studies/ records that were reviewed today include: .  04/17/18    90% apical LAD.  The LAD contains luminal irregularities otherwise up to 30% in the mid vessel.  Normal left main.  50 to 70% distal circumflex before the dominant obtuse marginal.  50 to 60% segmental mid RCA.  60 to 70% diffuse mid PDA segment.  Normal LV function with EF 60%.  LVEDP was low.  RECOMMENDATIONS:   Coronary artery disease with symptoms likely being driven by apical high-grade LAD obstruction.  Distal location and moderate disease in other territories mandate medical therapy.  We will add low-dose beta-blocker therapy and Plavix.  Plavix should be continued for 6 to 12 months.  Sublingual nitroglycerin for recurrent episodes of chest pain.  Resume ACE inhibitor/diuretic combo therapy tomorrow.  2 additional hours of hydration with 150 cc/h of saline before discharge.  Echo 08/20/17 Study Conclusions  - Left ventricle: The cavity size was normal. There was mild   concentric hypertrophy. Systolic function was normal. The   estimated ejection fraction was in the range of 60% to 65%. Wall   motion was normal;  there were no regional wall motion   abnormalities. Doppler parameters are consistent with abnormal   left ventricular relaxation (grade 1 diastolic dysfunction).   Doppler parameters are consistent with elevated ventricular   end-diastolic filling pressure. - Aortic valve: Trileaflet; normal thickness leaflets. There was no   regurgitation. - Mitral valve: There was trivial regurgitation. - Right ventricle: Systolic function was normal. - Right atrium: The atrium was normal in size. - Tricuspid valve: There was mild regurgitation. - Pulmonic valve: There was no regurgitation. - Pulmonary arteries: Systolic pressure was at the upper limits of   normal. - Pericardium, extracardiac: There was no pericardial effusion.  Impressions:  - Normal global longitudinal strain : -22.3%.   ASSESSMENT AND PLAN:  1.  HTN much improved and not low.  Still episodes with lightheadedness.  With Loletha Grayer will stop BB. Discussed with Dr. Tamala [name].  Will increase amlodipine to 5 mg daily she will monitor her BP at home. Will have her follow up in 3 weeks to re-eval.    2.  CAD treating medically  Current medicines are reviewed with the patient today.  The patient Has no concerns regarding medicines.  The following changes have been made:  See above Labs/ tests ordered today include:see above  Disposition:   FU:  see above  Signed, Cecilie Kicks, NP  06/07/2018 3:18 PM    [facility] [place], [place], [place], [place] Phone: [phone]; Fax: [phone]

## 2018-06-07 NOTE — Patient Instructions (Signed)
Medication Instructions:  1.) STOP: Metoprolol   2.) INCREASE: Amlodipine to 5 mg once a day   If you need a refill on your cardiac medications before your next appointment, please call your pharmacy.   Lab work: None  If you have labs (blood work) drawn today and your tests are completely normal, you will receive your results only by: Marland Kitchen MyChart Message (if you have MyChart) OR . A paper copy in the mail If you have any lab test that is abnormal or we need to change your treatment, we will call you to review the results.  Testing/Procedures: None  Follow-Up: At [facility], you and your health needs are our priority.  As part of our continuing mission to provide you with exceptional heart care, we have created designated Provider Care Teams.  These Care Teams include your primary Cardiologist (physician) and Advanced Practice Providers (APPs -  Physician Assistants and Nurse Practitioners) who all work together to provide you with the care you need, when you need it. You will need a follow up appointment in 3 weeks.  Please call our office 2 months in advance to schedule this appointment.  You may see Sinclair Grooms, MD  or one of the following Advanced Practice Providers on your designated Care Team:   Truitt Merle, NP Cecilie Kicks, NP . Kathyrn Drown, NP  Any Other Special Instructions Will Be Listed Below (If Applicable).

## 2018-06-15 ENCOUNTER — Other Ambulatory Visit: Payer: Self-pay

## 2018-06-15 DIAGNOSIS — E1165 Type 2 diabetes mellitus with hyperglycemia: Secondary | ICD-10-CM

## 2018-06-15 MED ORDER — INSULIN GLARGINE 100 UNIT/ML SOLOSTAR PEN: 15.0000 [IU] | PEN_INJECTOR | Freq: Every day | SUBCUTANEOUS | 0 refills | Status: DC

## 2018-06-15 MED ORDER — ROSUVASTATIN CALCIUM 20 MG PO TABS: 20.0000 mg | ORAL_TABLET | Freq: Every day | ORAL | 3 refills | Status: DC

## 2018-06-25 ENCOUNTER — Other Ambulatory Visit: Payer: Self-pay

## 2018-06-25 DIAGNOSIS — E1165 Type 2 diabetes mellitus with hyperglycemia: Secondary | ICD-10-CM

## 2018-06-27 MED ORDER — EMPAGLIFLOZIN 10 MG PO TABS: 10.0000 mg | ORAL_TABLET | Freq: Every day | ORAL | 2 refills | Status: DC

## 2018-07-02 ENCOUNTER — Telehealth: Payer: Self-pay | Admitting: Nurse Practitioner

## 2018-07-02 NOTE — Telephone Encounter (Signed)
Primary Cardiologist: Tamala Julian  Patient contacted by myself. History reviewed. No symptoms to suggest any unstable cardiac conditions. Patient does have a cough - felt like it was allergy related. Does not have a way of checking her BP but prefers to change her appointment. If her symptoms change, she has been instructed to contact our office.   Burtis Junes, RN, [place] [address] [place], [place]  [address] [phone]

## 2018-07-03 ENCOUNTER — Ambulatory Visit: Payer: Medicare Other | Admitting: Nurse Practitioner

## 2018-07-04 ENCOUNTER — Telehealth: Payer: Self-pay | Admitting: *Deleted

## 2018-07-04 NOTE — Telephone Encounter (Signed)
Called pt to r/s appt with Dr. Tamala Julian due to COVID-19.

## 2018-07-17 ENCOUNTER — Ambulatory Visit: Payer: Medicare Other | Admitting: Family Medicine

## 2018-08-16 ENCOUNTER — Telehealth: Payer: Self-pay | Admitting: Interventional Cardiology

## 2018-08-16 NOTE — Telephone Encounter (Signed)
Patient set up for MyChart? DECLINED   Is patient using Smartphone/computer/tablet? NO  Did audio/video work NO  Does patient need telephone visit? YES  Best phone number to use? [phone]  Special Instructions? DOES NOT HAVE BP CUFF , WILL HAVE WT     Virtual Visit Pre-Appointment Phone Call  "(Name), I am calling you today to discuss your upcoming appointment. We are currently trying to limit exposure to the virus that causes COVID-19 by seeing patients at home rather than in the office."  1. "What is the BEST phone number to call the day of the visit?" - include this in appointment notes  2. Do you have or have access to (through a family member/friend) a smartphone with video capability that we can use for your visit?" a. If yes - list this number in appt notes as cell (if different from BEST phone #) and list the appointment type as a VIDEO visit in appointment notes b. If no - list the appointment type as a PHONE visit in appointment notes  3. Confirm consent - "In the setting of the current Covid19 crisis, you are scheduled for a (phone or video) visit with your provider on (date) at (time).  Just as we do with many in-office visits, in order for you to participate in this visit, we must obtain consent.  If you'd like, I can send this to your mychart (if signed up) or email for you to review.  Otherwise, I can obtain your verbal consent now.  All virtual visits are billed to your insurance company just like a normal visit would be.  By agreeing to a virtual visit, we'd like you to understand that the technology does not allow for your provider to perform an examination, and thus may limit your provider's ability to fully assess your condition. If your provider identifies any concerns that need to be evaluated in person, we will make arrangements to do so.  Finally, though the technology is pretty good, we cannot assure that it will always work on either your or our end, and in  the setting of a video visit, we may have to convert it to a phone-only visit.  In either situation, we cannot ensure that we have a secure connection.  Are you willing to proceed?" STAFF: Did the patient verbally acknowledge consent to telehealth visit? Document YES/NO here: YES  4. Advise patient to be prepared - "Two hours prior to your appointment, go ahead and check your blood pressure, pulse, oxygen saturation, and your weight (if you have the equipment to check those) and write them all down. When your visit starts, your provider will ask you for this information. If you have an Apple Watch or Kardia device, please plan to have heart rate information ready on the day of your appointment. Please have a pen and paper handy nearby the day of the visit as well."  5. Give patient instructions for MyChart download to smartphone OR Doximity/Doxy.me as below if video visit (depending on what platform provider is using)  6. Inform patient they will receive a phone call 15 minutes prior to their appointment time (may be from unknown caller ID) so they should be prepared to answer    TELEPHONE CALL NOTE  [name] has been deemed a candidate for a follow-up tele-health visit to limit community exposure during the Covid-19 pandemic. I spoke with the patient via phone to ensure availability of phone/video source, confirm preferred email & phone number, and discuss  instructions and expectations.  I reminded [name] to be prepared with any vital sign and/or heart rhythm information that could potentially be obtained via home monitoring, at the time of her visit. I reminded [name] to expect a phone call prior to her visit.  [name] 08/16/2018 1:54 PM   INSTRUCTIONS FOR DOWNLOADING THE MYCHART APP TO SMARTPHONE  - The patient must first make sure to have activated MyChart and know their login information - If Apple, go to [employer] and type in MyChart in the search bar and  download the app. If Android, ask patient to go to [employer] and type in [place] in the search bar and download the app. The app is free but as with any other app downloads, their phone may require them to verify saved payment information or Apple/Android password.  - The patient will need to then log into the app with their MyChart username and password, and select [place] [facility] as their healthcare provider to link the account. When it is time for your visit, go to the MyChart app, find appointments, and click Begin Video Visit. Be sure to Select Allow for your device to access the Microphone and Camera for your visit. You will then be connected, and your provider will be with you shortly.  **If they have any issues connecting, or need assistance please contact MyChart service desk [phone])**  **If using a computer, in order to ensure the best quality for their visit they will need to use either of the following Internet Browsers: [employer], or Google Chrome**  IF USING DOXIMITY or DOXY.ME - The patient will receive a link just prior to their visit by text.     FULL LENGTH CONSENT FOR TELE-HEALTH VISIT   I hereby voluntarily request, consent and authorize [facility] and its employed or contracted physicians, physician assistants, nurse practitioners or other licensed health care professionals (the Practitioner), to provide me with telemedicine health care services (the Services") as deemed necessary by the treating Practitioner. I acknowledge and consent to receive the Services by the Practitioner via telemedicine. I understand that the telemedicine visit will involve communicating with the Practitioner through live audiovisual communication technology and the disclosure of certain medical information by electronic transmission. I acknowledge that I have been given the opportunity to request an in-person assessment or other available alternative prior to the  telemedicine visit and am voluntarily participating in the telemedicine visit.  I understand that I have the right to withhold or withdraw my consent to the use of telemedicine in the course of my care at any time, without affecting my right to future care or treatment, and that the Practitioner or I may terminate the telemedicine visit at any time. I understand that I have the right to inspect all information obtained and/or recorded in the course of the telemedicine visit and may receive copies of available information for a reasonable fee.  I understand that some of the potential risks of receiving the Services via telemedicine include:   Delay or interruption in medical evaluation due to technological equipment failure or disruption;  Information transmitted may not be sufficient (e.g. poor resolution of images) to allow for appropriate medical decision making by the Practitioner; and/or   In rare instances, security protocols could fail, causing a breach of personal health information.  Furthermore, I acknowledge that it is my responsibility to provide information about my medical history, conditions and care that is complete and accurate  to the best of my ability. I acknowledge that Practitioner's advice, recommendations, and/or decision may be based on factors not within their control, such as incomplete or inaccurate data provided by me or distortions of diagnostic images or specimens that may result from electronic transmissions. I understand that the practice of medicine is not an exact science and that Practitioner makes no warranties or guarantees regarding treatment outcomes. I acknowledge that I will receive a copy of this consent concurrently upon execution via email to the email address I last provided but may also request a printed copy by calling the office of [facility].    I understand that my insurance will be billed for this visit.   I have read or had this consent read to  me.  I understand the contents of this consent, which adequately explains the benefits and risks of the Services being provided via telemedicine.   I have been provided ample opportunity to ask questions regarding this consent and the Services and have had my questions answered to my satisfaction.  I give my informed consent for the services to be provided through the use of telemedicine in my medical care  By participating in this telemedicine visit I agree to the above.

## 2018-08-20 NOTE — Progress Notes (Signed)
Virtual Visit via Video Note   This visit type was conducted due to national recommendations for restrictions regarding the COVID-19 Pandemic (e.g. social distancing) in an effort to limit this patient's exposure and mitigate transmission in our community.  Due to her co-morbid illnesses, this patient is at least at moderate risk for complications without adequate follow up.  This format is felt to be most appropriate for this patient at this time.  All issues noted in this document were discussed and addressed.  A limited physical exam was performed with this format.  Please refer to the patient's chart for her consent to telehealth for [facility].   Date:  08/21/2018   ID:  [name], DOB 1945-06-06, MRN [id number]  Patient Location: Home Provider Location: Office  PCP:  [name], MD  Cardiologist:  Sinclair Grooms, MD  Electrophysiologist:  None   Evaluation Performed:  Follow-Up Visit  Chief Complaint:  CAD  History of Present Illness:    [name] is a 73 y.o. female with a hx of multiple cardiac risk factors including type 2 diabetes, hypertension, hyperlipidemia, CAD with angina pectoris (cath 04/17/18 - 90% apical LAD, 50-70% Cfx, RCA, & PDA), and prior brain tumor.  She has angina several times a day.  Resting/lying improves the discomfort.  She is having more angina now than she did 6 months ago.  Nitroglycerin relieves the discomfort.  The discomfort is not awakening her from sleep.  She feels under stress during the pandemic.  She has a lot of fear and anxiety concerning outcome.  The patient does not have symptoms concerning for COVID-19 infection (fever, chills, cough, or new shortness of breath).    Past Medical History:  Diagnosis Date  . Allergy   . Asthma   . Blood transfusion without reported diagnosis 12/23/2004   with colon cancer  . Brain tumor (benign) ([place])   . Colon cancer [facility]) Nov. 27,  2006  . Diabetes mellitus    Type 2  . Female  bladder prolapse   . Gastritis   . GERD (gastroesophageal reflux disease)   . Glaucoma   . Hypercholesterolemia   . Hypertension    Past Surgical History:  Procedure Laterality Date  . ABDOMINAL HYSTERECTOMY  1976  . BALLOON DILATION N/A 08/23/2017   Procedure: BALLOON DILATION;  Surgeon: Milus Banister, MD;  Location: [facility];  Service: Endoscopy;  Laterality: N/A;  Esophageal Diltation  . bladder tac    . CARDIAC CATHETERIZATION  2005  . CATARACT EXTRACTION, BILATERAL    . COLONOSCOPY W/ BIOPSIES    . ESOPHAGEAL MANOMETRY N/A 03/20/2015   Procedure: ESOPHAGEAL MANOMETRY (EM);  Surgeon: Gatha Mayer, MD;  Location: [facility];  Service: Endoscopy;  Laterality: N/A;  . ESOPHAGOGASTRODUODENOSCOPY (EGD) WITH PROPOFOL N/A 08/23/2017   Procedure: ESOPHAGOGASTRODUODENOSCOPY (EGD) WITH PROPOFOL;  Surgeon: Milus Banister, MD;  Location: [facility];  Service: Endoscopy;  Laterality: N/A;  . EYE SURGERY     mole removed left eye  . LAPAROSCOPIC NISSEN FUNDOPLICATION  2119  . LEFT HEART CATH AND CORONARY ANGIOGRAPHY N/A 04/17/2018   Procedure: LEFT HEART CATH AND CORONARY ANGIOGRAPHY;  Surgeon: Belva Crome, MD;  Location: [facility];  Service: Cardiovascular;  Laterality: N/A;  . repair prolapsedbladder    . RIGHT COLECTOMY  Nov. 27, 2006  . TMJ ARTHROPLASTY    . UPPER GASTROINTESTINAL ENDOSCOPY       Current Meds  Medication Sig  .  amLODipine (NORVASC) 5 MG tablet Take 1 tablet (5 mg total) by mouth daily.  Marland Kitchen aspirin 81 MG chewable tablet Chew 81 mg by mouth daily.    . Blood Glucose Monitoring Suppl (ONETOUCH VERIO) w/Device KIT 1 each by Does not apply route daily.  . clopidogrel (PLAVIX) 75 MG tablet Take 1 tablet (75 mg total) by mouth daily.  . diclofenac sodium (VOLTAREN) 1 % GEL Apply 2 g topically 4 (four) times daily.  . empagliflozin (JARDIANCE) 10 MG TABS tablet Take 10 mg by mouth daily.  Marland Kitchen glucose blood (ONETOUCH VERIO) test strip Use as instructed  .  Insulin Glargine (LANTUS SOLOSTAR) 100 UNIT/ML Solostar Pen Inject 15 Units into the skin daily at 10 pm.  . Insulin Pen Needle 31G X 5 MM MISC Check blood sugar once daily  . Lancet Devices (ONE TOUCH DELICA LANCING DEV) MISC Check blood sugar once daily  . nitroGLYCERIN (NITROSTAT) 0.4 MG SL tablet Place 1 tablet (0.4 mg total) under the tongue every 5 (five) minutes as needed. If chest pain not relieved by third tab call 911.  Glory Rosebush DELICA LANCETS 29J MISC CHECK BLOOD SUGAR ONCE DAILY  . quinapril-hydrochlorothiazide (ACCURETIC) 20-12.5 MG tablet Take 2 tablets by mouth daily.  . rosuvastatin (CRESTOR) 20 MG tablet Take 1 tablet (20 mg total) by mouth daily.     Allergies:   Metformin and related   Social History   Tobacco Use  . Smoking status: Never Smoker  . Smokeless tobacco: Never Used  Substance Use Topics  . Alcohol use: No  . Drug use: No     Family Hx: The patient's family history includes Bone cancer in her father; Diabetes in her daughter; Heart attack in her daughter; Hypertension in her daughter; Lymphoma in an other family member; Suicidality in her son. There is no history of Colon cancer or Stomach cancer.  ROS:   Please see the history of present illness.    No particular medication side effects.  Still has occasional lightheadedness. All other systems reviewed and are negative.   Prior CV studies:   The following studies were reviewed today:  No new imaging or functional data.  Labs/Other Tests and Data Reviewed:    EKG:  No ECG reviewed.  Recent Labs: 04/12/2018: TSH 0.516 04/13/2018: Hemoglobin 10.8; Platelets 255 05/18/2018: BUN 29; Creatinine, Ser 1.42; Potassium 3.6; Sodium 138   Recent Lipid Panel Lab Results  Component Value Date/Time   CHOL 84 (L) 04/12/2018 02:43 PM   TRIG 123 04/12/2018 02:43 PM   HDL 43 04/12/2018 02:43 PM   CHOLHDL 2.0 04/12/2018 02:43 PM   CHOLHDL 3.8 07/20/2015 04:41 PM   LDLCALC 16 04/12/2018 02:43 PM    Wt  Readings from Last 3 Encounters:  08/21/18 125 lb (56.7 kg)  06/07/18 126 lb 1.9 oz (57.2 kg)  05/24/18 127 lb 9.6 oz (57.9 kg)     Objective:    Vital Signs:  Ht '5\' 4"'$  (1.626 m)   Wt 125 lb (56.7 kg)   BMI 21.46 kg/m    VITAL SIGNS:  reviewed  ASSESSMENT & PLAN:    1. Coronary artery disease of native heart with stable angina pectoris, unspecified vessel or lesion type ([place])   2. HYPERTENSION, BENIGN ESSENTIAL   3. Uncontrolled type 2 diabetes mellitus with hyperglycemia ([place])   4. HYPERTRIGLYCERIDEMIA   5. 2019 novel coronavirus disease (COVID-19)    PLAN:  1. Add isosorbide mononitrate 30 mg daily 2. Will need an office appointment  to further evaluate. 3. Hemoglobin A1c target less than 7 4. Consider Vascepa.  Triglyceride was 123 in January when LDL was 16.  Total cholesterol was 84.   Plan follow-up visit in 2 to 3 months.  Uptitrate nitrate therapy.  Cath in December demonstrated three-vessel moderate to moderately severe disease which we are trying to control with medical therapy.  COVID-19 Education: The signs and symptoms of COVID-19 were discussed with the patient and how to seek care for testing (follow up with PCP or arrange E-visit).  The importance of social distancing was discussed today.  Time:   Today, I have spent 15 minutes with the patient with telehealth technology discussing the above problems.     Medication Adjustments/Labs and Tests Ordered: Current medicines are reviewed at length with the patient today.  Concerns regarding medicines are outlined above.   Tests Ordered: No orders of the defined types were placed in this encounter.   Medication Changes: No orders of the defined types were placed in this encounter.   Disposition:  Follow up in 3 month(s)  Signed, Sinclair Grooms, MD  08/21/2018 3:44 PM    [facility]

## 2018-08-21 ENCOUNTER — Encounter: Payer: Self-pay | Admitting: Interventional Cardiology

## 2018-08-21 ENCOUNTER — Other Ambulatory Visit: Payer: Self-pay

## 2018-08-21 ENCOUNTER — Telehealth (INDEPENDENT_AMBULATORY_CARE_PROVIDER_SITE_OTHER): Payer: Medicare Other | Admitting: Interventional Cardiology

## 2018-08-21 VITALS — Ht 64.0 in | Wt 125.0 lb

## 2018-08-21 DIAGNOSIS — E1165 Type 2 diabetes mellitus with hyperglycemia: Secondary | ICD-10-CM

## 2018-08-21 DIAGNOSIS — I10 Essential (primary) hypertension: Secondary | ICD-10-CM

## 2018-08-21 DIAGNOSIS — I25118 Atherosclerotic heart disease of native coronary artery with other forms of angina pectoris: Secondary | ICD-10-CM | POA: Diagnosis not present

## 2018-08-21 DIAGNOSIS — E781 Pure hyperglyceridemia: Secondary | ICD-10-CM

## 2018-08-21 DIAGNOSIS — U071 COVID-19: Secondary | ICD-10-CM

## 2018-08-21 MED ORDER — ISOSORBIDE MONONITRATE ER 30 MG PO TB24: 30.0000 mg | ORAL_TABLET | Freq: Every day | ORAL | 3 refills | Status: DC

## 2018-08-21 NOTE — Patient Instructions (Signed)
Medication Instructions:  1) START Isosorbide 30mg  once daily.  Call us in 1-2 weeks and let us know if this medication helped with your symptoms.  If you need a refill on your cardiac medications before your next appointment, please call your pharmacy.   Lab work: None If you have labs (blood work) drawn today and your tests are completely normal, you will receive your results only by: Marland Kitchen MyChart Message (if you have MyChart) OR . A paper copy in the mail If you have any lab test that is abnormal or we need to change your treatment, we will call you to review the results.  Testing/Procedures: None  Follow-Up: At [facility], you and your health needs are our priority.  As part of our continuing mission to provide you with exceptional heart care, we have created designated Provider Care Teams.  These Care Teams include your primary Cardiologist (physician) and Advanced Practice Providers (APPs -  Physician Assistants and Nurse Practitioners) who all work together to provide you with the care you need, when you need it. You will need a follow up appointment in 2 months.  Please call our office 2 months in advance to schedule this appointment.  You may see Sinclair Grooms, MD or one of the following Advanced Practice Providers on your designated Care Team:   Truitt Merle, NP Cecilie Kicks, NP . Kathyrn Drown, NP  Any Other Special Instructions Will Be Listed Below (If Applicable).

## 2018-08-22 ENCOUNTER — Ambulatory Visit: Payer: Medicare Other | Admitting: Interventional Cardiology

## 2018-08-24 ENCOUNTER — Other Ambulatory Visit: Payer: Self-pay | Admitting: Internal Medicine

## 2018-09-27 ENCOUNTER — Other Ambulatory Visit: Payer: Self-pay | Admitting: *Deleted

## 2018-09-27 DIAGNOSIS — E1165 Type 2 diabetes mellitus with hyperglycemia: Secondary | ICD-10-CM

## 2018-10-02 MED ORDER — LANTUS SOLOSTAR 100 UNIT/ML ~~LOC~~ SOPN: 15.0000 [IU] | PEN_INJECTOR | Freq: Every day | SUBCUTANEOUS | 0 refills | Status: DC

## 2018-10-02 MED ORDER — JARDIANCE 10 MG PO TABS: 10.0000 mg | ORAL_TABLET | Freq: Every day | ORAL | 2 refills | Status: DC

## 2018-10-29 ENCOUNTER — Telehealth: Payer: Self-pay | Admitting: Interventional Cardiology

## 2018-10-29 NOTE — Telephone Encounter (Signed)

## 2018-10-29 NOTE — Progress Notes (Deleted)
Cardiology Office Note:    Date:  10/29/2018   ID:  [name], DOB 1945/09/02, MRN [id number]  PCP:  [name], MD  Cardiologist:  Sinclair Grooms, MD   Referring MD: [name], MD   No chief complaint on file.   History of Present Illness:    [name] is a 73 y.o. female with a hx of multiple cardiac risk factors including type 2 diabetes, hypertension, hyperlipidemia, CAD with angina pectoris (cath 04/17/18 - 90% apical LAD, 50-70% Cfx, RCA, & PDA), and prior brain tumor.  ***  Past Medical History:  Diagnosis Date   Allergy    Asthma    Blood transfusion without reported diagnosis 12/23/2004   with colon cancer   Brain tumor (benign) ([place])    Colon cancer ([place]) Nov. 27,  2006   Diabetes mellitus    Type 2   Female bladder prolapse    Gastritis    GERD (gastroesophageal reflux disease)    Glaucoma    Hypercholesterolemia    Hypertension     Past Surgical History:  Procedure Laterality Date   ABDOMINAL HYSTERECTOMY  1976   BALLOON DILATION N/A 08/23/2017   Procedure: BALLOON DILATION;  Surgeon: Milus Banister, MD;  Location: [facility];  Service: Endoscopy;  Laterality: N/A;  Esophageal Diltation   bladder tac     CARDIAC CATHETERIZATION  2005   CATARACT EXTRACTION, BILATERAL     COLONOSCOPY W/ BIOPSIES     ESOPHAGEAL MANOMETRY N/A 03/20/2015   Procedure: ESOPHAGEAL MANOMETRY (EM);  Surgeon: Gatha Mayer, MD;  Location: [facility];  Service: Endoscopy;  Laterality: N/A;   ESOPHAGOGASTRODUODENOSCOPY (EGD) WITH PROPOFOL N/A 08/23/2017   Procedure: ESOPHAGOGASTRODUODENOSCOPY (EGD) WITH PROPOFOL;  Surgeon: Milus Banister, MD;  Location: [facility];  Service: Endoscopy;  Laterality: N/A;   EYE SURGERY     mole removed left eye   LAPAROSCOPIC NISSEN FUNDOPLICATION  5170   LEFT HEART CATH AND CORONARY ANGIOGRAPHY N/A 04/17/2018   Procedure: LEFT HEART CATH AND CORONARY ANGIOGRAPHY;  Surgeon: Belva Crome, MD;  Location:  [facility];  Service: Cardiovascular;  Laterality: N/A;   repair prolapsedbladder     RIGHT COLECTOMY  Nov. 27, 2006   TMJ ARTHROPLASTY     UPPER GASTROINTESTINAL ENDOSCOPY      Current Medications: No outpatient medications have been marked as taking for the 10/30/18 encounter (Appointment) with Belva Crome, MD.     Allergies:   Metformin and related   Social History   Socioeconomic History   Marital status: Legally Separated    Spouse name: Not on file   Number of children: 2   Years of education: Not on file   Highest education level: Not on file  Occupational History   Not on file  Social Needs   Financial resource strain: Not on file   Food insecurity    Worry: Not on file    Inability: Not on file   Transportation needs    Medical: Not on file    Non-medical: Not on file  Tobacco Use   Smoking status: Never Smoker   Smokeless tobacco: Never Used  Substance and Sexual Activity   Alcohol use: No   Drug use: No   Sexual activity: Not on file  Lifestyle   Physical activity    Days per week: Not on file    Minutes per session: Not on file   Stress: Not on file  Relationships  Social Herbalist on phone: Not on file    Gets together: Not on file    Attends religious service: Not on file    Active member of club or organization: Not on file    Attends meetings of clubs or organizations: Not on file    Relationship status: Not on file  Other Topics Concern   Not on file  Social History Narrative   Not on file     Family History: The patient's family history includes Bone cancer in her father; Diabetes in her daughter; Heart attack in her daughter; Hypertension in her daughter; Lymphoma in an other family member; Suicidality in her son. There is no history of Colon cancer or Stomach cancer.  ROS:   Please see the history of present illness.    *** All other systems reviewed and are negative.  EKGs/Labs/Other  Studies Reviewed:    The following studies were reviewed today: Diagnostic Dominance: Right    EKG:  EKG ***  Recent Labs: 04/12/2018: TSH 0.516 04/13/2018: Hemoglobin 10.8; Platelets 255 05/18/2018: BUN 29; Creatinine, Ser 1.42; Potassium 3.6; Sodium 138  Recent Lipid Panel    Component Value Date/Time   CHOL 84 (L) 04/12/2018 1443   TRIG 123 04/12/2018 1443   HDL 43 04/12/2018 1443   CHOLHDL 2.0 04/12/2018 1443   CHOLHDL 3.8 07/20/2015 1641   VLDL 74 (H) 07/20/2015 1641   LDLCALC 16 04/12/2018 1443    Physical Exam:    VS:  There were no vitals taken for this visit.    Wt Readings from Last 3 Encounters:  08/21/18 125 lb (56.7 kg)  06/07/18 126 lb 1.9 oz (57.2 kg)  05/24/18 127 lb 9.6 oz (57.9 kg)     GEN: ***. No acute distress HEENT: Normal NECK: No JVD. LYMPHATICS: No lymphadenopathy CARDIAC: *** RRR without murmur, gallop, or edema. VASCULAR: *** Normal Pulses. No bruits. RESPIRATORY:  Clear to auscultation without rales, wheezing or rhonchi  ABDOMEN: Soft, non-tender, non-distended, No pulsatile mass, MUSCULOSKELETAL: No deformity  SKIN: Warm and dry NEUROLOGIC:  Alert and oriented x 3 PSYCHIATRIC:  Normal affect   ASSESSMENT:    1. Coronary artery disease of native heart with stable angina pectoris, unspecified vessel or lesion type ([place])   2. Essential hypertension   3. Stable angina (H[facility])    PLAN:    In order of problems listed above:  1. ***   Medication Adjustments/Labs and Tests Ordered: Current medicines are reviewed at length with the patient today.  Concerns regarding medicines are outlined above.  No orders of the defined types were placed in this encounter.  No orders of the defined types were placed in this encounter.   There are no Patient Instructions on file for this visit.   Signed, Sinclair Grooms, MD  10/29/2018 8:58 PM    [place]

## 2018-10-30 ENCOUNTER — Other Ambulatory Visit: Payer: Self-pay | Admitting: Interventional Cardiology

## 2018-10-30 ENCOUNTER — Ambulatory Visit: Payer: Medicare Other | Admitting: Interventional Cardiology

## 2018-10-30 MED ORDER — CLOPIDOGREL BISULFATE 75 MG PO TABS: 75.0000 mg | ORAL_TABLET | Freq: Every day | ORAL | 3 refills | Status: DC

## 2018-10-30 NOTE — Telephone Encounter (Signed)
New Message    *STAT* If patient is at the pharmacy, call can be transferred to refill team.   1. Which medications need to be refilled? (please list name of each medication and dose if known) clopidogrel (PLAVIX) 75 MG tablet   2. Which pharmacy/location (including street and city if local pharmacy) is medication to be sent to? [facility] [phone] - [place], [address] AT [address]  3. Do they need a 30 day or 90 day supply? [place]

## 2018-10-30 NOTE — Telephone Encounter (Signed)
Pt's medication was sent to pt's pharmacy as requested. Confirmation received.  °

## 2018-12-31 ENCOUNTER — Other Ambulatory Visit: Payer: Self-pay

## 2018-12-31 DIAGNOSIS — E1165 Type 2 diabetes mellitus with hyperglycemia: Secondary | ICD-10-CM

## 2019-01-01 MED ORDER — JARDIANCE 10 MG PO TABS: 10.0000 mg | ORAL_TABLET | Freq: Every day | ORAL | 2 refills | Status: DC

## 2019-01-10 ENCOUNTER — Other Ambulatory Visit: Payer: Self-pay | Admitting: *Deleted

## 2019-01-10 DIAGNOSIS — E1165 Type 2 diabetes mellitus with hyperglycemia: Secondary | ICD-10-CM

## 2019-01-10 MED ORDER — LANTUS SOLOSTAR 100 UNIT/ML ~~LOC~~ SOPN: 15.0000 [IU] | PEN_INJECTOR | Freq: Every day | SUBCUTANEOUS | 12 refills | Status: DC

## 2019-01-11 ENCOUNTER — Other Ambulatory Visit: Payer: Self-pay

## 2019-01-11 DIAGNOSIS — Z20822 Contact with and (suspected) exposure to covid-19: Secondary | ICD-10-CM

## 2019-01-12 LAB — NOVEL CORONAVIRUS, NAA: SARS-CoV-2, NAA: NOT DETECTED

## 2019-01-14 NOTE — Progress Notes (Signed)
Cardiology Admission History and Physical   Date:  01/15/2019   ID:  [name], DOB [birth year], MRN [id number]  PCP:  [name], MD  Cardiologist:  Sinclair Grooms, MD   Referring MD: [name], MD   Chief Complaint  Patient presents with   Coronary Artery Disease    Unstable angina    History of Present Illness:    [name] is a 73 y.o. female with a hx of multiple cardiac risk factors including type 2 diabetes, hypertension, hyperlipidemia, CAD with angina pectoris (cath 04/17/18 - 90% apical LAD, 50-70% Cfx, RCA, & PDA), and prior brain tumor.  Patient has been having increasingly frequent chest discomfort.  She was awakened at around 3:30 AM with a lingering Tension in my left upper chest" that has persisted ever since.  Fortuitously, she had an office visit today.  She still has grade 2-3 over 10 chest discomfort.  Denies orthopnea, PND, and aggravation of the chest discomfort with ambulation.  She has prior history of moderate nonobstructive coronary disease as outlined below.  Since cardiac cath she has continued to have angina with activity.  Quality of life has suffered because discomfort has become more and more restrictive.  The discomfort that is present today is very similar to her initial complaints when we first met.  She has not tried nitroglycerin for the chest discomfort today.  She uses the nitroglycerin infrequently.    Past Medical History:  Diagnosis Date   Allergy    Asthma    Blood transfusion without reported diagnosis 12/23/2004   with colon cancer   Brain tumor (benign) ([place])    Colon cancer ([place]) Nov. 27,  2006   Diabetes mellitus    Type 2   Female bladder prolapse    Gastritis    GERD (gastroesophageal reflux disease)    Glaucoma    Hypercholesterolemia    Hypertension     Past Surgical History:  Procedure Laterality Date   ABDOMINAL HYSTERECTOMY  1976   BALLOON DILATION N/A 08/23/2017   Procedure: BALLOON  DILATION;  Surgeon: Milus Banister, MD;  Location: [facility];  Service: Endoscopy;  Laterality: N/A;  Esophageal Diltation   bladder tac     CARDIAC CATHETERIZATION  2005   CATARACT EXTRACTION, BILATERAL     COLONOSCOPY W/ BIOPSIES     ESOPHAGEAL MANOMETRY N/A 03/20/2015   Procedure: ESOPHAGEAL MANOMETRY (EM);  Surgeon: Gatha Mayer, MD;  [facility];  Service: Endoscopy;  Laterality: N/A;   ESOPHAGOGASTRODUODENOSCOPY (EGD) WITH PROPOFOL N/A 08/23/2017   Procedure: ESOPHAGOGASTRODUODENOSCOPY (EGD) WITH PROPOFOL;  Surgeon: Milus Banister, MD;  [facility];  Service: Endoscopy;  Laterality: N/A;   EYE SURGERY     mole removed left eye   LAPAROSCOPIC NISSEN FUNDOPLICATION  8466   LEFT HEART CATH AND CORONARY ANGIOGRAPHY N/A 04/17/2018   Procedure: LEFT HEART CATH AND CORONARY ANGIOGRAPHY;  Surgeon: Belva Crome, MD;  Location: [facility];  Service: Cardiovascular;  Laterality: N/A;   repair prolapsedbladder     RIGHT COLECTOMY  Nov. 27, 2006   TMJ ARTHROPLASTY     UPPER GASTROINTESTINAL ENDOSCOPY      Current Medications: Current Meds  Medication Sig   amLODipine (NORVASC) 5 MG tablet Take 1 tablet (5 mg total) by mouth daily.   aspirin 81 MG chewable tablet Chew 81 mg by mouth daily.     Blood Glucose Monitoring Suppl (ONETOUCH VERIO) w/Device KIT 1 each by  not apply route daily.  °• clopidogrel (PLAVIX) 75 MG tablet Take 1 tablet (75 mg total) by mouth daily.  °• diclofenac sodium (VOLTAREN) 1 % GEL Apply 2 g topically 4 (four) times daily.  °• empagliflozin (JARDIANCE) 10 MG TABS tablet Take 10 mg by mouth daily.  °• glucose blood (ONETOUCH VERIO) test strip Use as instructed  °• Insulin Glargine (LANTUS SOLOSTAR) 100 UNIT/ML Solostar Pen Inject 15 Units into the skin daily at 10 pm.  °• Insulin Pen Needle 31G X 5 MM MISC Check blood sugar once daily  °• isosorbide mononitrate (IMDUR) 30 MG 24 hr tablet Take 1 tablet (30 mg total) by  mouth daily.  °• Lancet Devices (ONE TOUCH DELICA LANCING DEV) MISC Check blood sugar once daily  °• nitroGLYCERIN (NITROSTAT) 0.4 MG SL tablet Place 1 tablet (0.4 mg total) under the tongue every 5 (five) minutes as needed. If chest pain not relieved by third tab call 911.  °• ONETOUCH DELICA LANCETS 33G MISC CHECK BLOOD SUGAR ONCE DAILY  °• quinapril-hydrochlorothiazide (ACCURETIC) 20-12.5 MG tablet Take 2 tablets by mouth daily.  °• rosuvastatin (CRESTOR) 20 MG tablet Take 1 tablet (20 mg total) by mouth daily.  °  ° °Allergies:   Metformin and related  ° °Social History  ° °Socioeconomic History  °• Marital status: Legally Separated  °  Spouse name: Not on file  °• Number of children: 2  °• Years of education: Not on file  °• Highest education level: Not on file  °Occupational History  °• Not on file  °Social Needs  °• Financial resource strain: Not on file  °• Food insecurity  °  Worry: Not on file  °  Inability: Not on file  °• Transportation needs  °  Medical: Not on file  °  Non-medical: Not on file  °Tobacco Use  °• Smoking status: Never Smoker  °• Smokeless tobacco: Never Used  °Substance and Sexual Activity  °• Alcohol use: No  °• Drug use: No  °• Sexual activity: Not on file  °Lifestyle  °• Physical activity  °  Days per week: Not on file  °  Minutes per session: Not on file  °• Stress: Not on file  °Relationships  °• Social connections  °  Talks on phone: Not on file  °  Gets together: Not on file  °  Attends religious service: Not on file  °  Active member of club or organization: Not on file  °  Attends meetings of clubs or organizations: Not on file  °  Relationship status: Not on file  °Other Topics Concern  °• Not on file  °Social History Narrative  °• Not on file  °  ° °Family History: °The patient's family history includes Bone cancer in her father; Diabetes in her daughter; Heart attack in her daughter; Hypertension in her daughter; Lymphoma in an other family member; Suicidality in her son.  There is no history of Colon cancer or Stomach cancer. ° °ROS:   °Please see the history of present illness.    °Diabetic.  Prior history of brain tumor.  All other systems reviewed and are negative. ° °EKGs/Labs/Other Studies Reviewed:   ° °The following studies were reviewed today: °Coronary angiography [name] 2020: °Diagnostic °Dominance: Right ° ° ° °EKG:  EKG EKG with sinus bradycardia, 52 bpm.  She has isolated ST elevation with T wave prominence in V2.  There are inferior T wave inversions that are new new compared to prior   tracings.  Discomfort is low-grade and mild when the EKG was performed. ° °Recent Labs: °04/12/2018: TSH 0.516 °04/13/2018: Hemoglobin 10.8; Platelets 255 °05/18/2018: BUN 29; Creatinine, Ser 1.42; Potassium 3.6; Sodium 138  °Recent Lipid Panel °   °Component Value Date/Time  ° CHOL 84 (L) 04/12/2018 1443  ° TRIG 123 04/12/2018 1443  ° HDL 43 04/12/2018 1443  ° CHOLHDL 2.0 04/12/2018 1443  ° CHOLHDL 3.8 07/20/2015 1641  ° VLDL 74 (H) 07/20/2015 1641  ° LDLCALC 16 04/12/2018 1443  ° ° °Physical Exam:   ° °VS:  BP (!) 118/54    Pulse (!) 52    Ht 5' 4" (1.626 m)    Wt 129 lb 12.8 oz (58.9 kg)    SpO2 98%    BMI 22.28 kg/m²    ° °Wt Readings from Last 3 Encounters:  °01/15/19 129 lb 12.8 oz (58.9 kg)  °08/21/18 125 lb (56.7 kg)  °06/07/18 126 lb 1.9 oz (57.2 kg)  °  ° °GEN: African-American female who is not in extreme distress.  Skin is warm and dry.. No acute distress °HEENT: Normal °NECK: No JVD. °LYMPHATICS: No lymphadenopathy °CARDIAC:  RRR without murmur, gallop, or edema. °VASCULAR:  Normal Pulses. No bruits. °RESPIRATORY:  Clear to auscultation without rales, wheezing or rhonchi  °ABDOMEN: Soft, non-tender, non-distended, No pulsatile mass, °MUSCULOSKELETAL: No deformity  °SKIN: Warm and dry °NEUROLOGIC:  Alert and oriented x 3 °PSYCHIATRIC:  Normal affect  ° °ASSESSMENT:   ° °1. Unstable angina (HCC)   °2. Coronary artery disease of native heart with stable angina pectoris, unspecified  vessel or lesion type (HCC)   °3. HYPERTENSION, BENIGN ESSENTIAL   °4. Uncontrolled type 2 diabetes mellitus with hyperglycemia (HCC)   °5. Educated about COVID-19 virus infection   ° °PLAN:   ° °In order of problems listed above: ° °1. Near continuous discomfort for the past 12 hours.  EKG has some ST-T abnormality that is worrisome for ischemia.  Plan to admit the patient to the hospital.  She has diffuse moderately severe coronary disease by cath in [name] 2020.  Medical therapy has not improved limitations related to exertional discomfort.  She will be admitted to the hospital, serial markers will be performed, IV heparin will be started, and IV nitroglycerin.  She will likely need to have repeat coronary angiography. °2. Coronary substrate is noted above from cath in [name].  She will need repeat cath to exclude progression. °3. Blood pressure is controlled. °4. Status of diabetes is unknown.  We will check a hemoglobin A1c. °5. She has been observing social distancing, masking, and handwashing. ° °Overall, it is difficult to tell on Mrs. Lacour.  I do not think she is having a STEMI but this possibly having a non-ST elevation MI.  EKG is different but not diagnostic for STEMI.  Given ongoing chest pain she needs to be admitted to the hospital and properly evaluated. ° ° °Medication Adjustments/Labs and Tests Ordered: °Current medicines are reviewed at length with the patient today.  Concerns regarding medicines are outlined above.  °No orders of the defined types were placed in this encounter. ° °No orders of the defined types were placed in this encounter. ° ° °There are no Patient Instructions on file for this visit.  ° °Signed, °Rubyann Lingle W Chrysa Rampy III, MD  °01/15/2019 3:53 PM    °[facility] °

## 2019-01-15 ENCOUNTER — Encounter: Payer: Self-pay | Admitting: Interventional Cardiology

## 2019-01-15 ENCOUNTER — Inpatient Hospital Stay (HOSPITAL_COMMUNITY)
Admission: EM | Admit: 2019-01-15 | Discharge: 2019-01-17 | DRG: 247 | Disposition: A | Discharge status: Home / Self Care | Payer: Medicare Other | Attending: Internal Medicine | Admitting: Internal Medicine

## 2019-01-15 ENCOUNTER — Other Ambulatory Visit: Payer: Self-pay

## 2019-01-15 ENCOUNTER — Encounter (INDEPENDENT_AMBULATORY_CARE_PROVIDER_SITE_OTHER): Payer: Self-pay

## 2019-01-15 ENCOUNTER — Ambulatory Visit (INDEPENDENT_AMBULATORY_CARE_PROVIDER_SITE_OTHER): Payer: Medicare Other | Admitting: Interventional Cardiology

## 2019-01-15 ENCOUNTER — Emergency Department (HOSPITAL_COMMUNITY): Payer: Medicare Other

## 2019-01-15 ENCOUNTER — Encounter (HOSPITAL_COMMUNITY): Payer: Self-pay

## 2019-01-15 VITALS — BP 118/54 | HR 52 | Ht 64.0 in | Wt 129.8 lb

## 2019-01-15 DIAGNOSIS — Z7902 Long term (current) use of antithrombotics/antiplatelets: Secondary | ICD-10-CM | POA: Diagnosis not present

## 2019-01-15 DIAGNOSIS — I959 Hypotension, unspecified: Secondary | ICD-10-CM | POA: Diagnosis not present

## 2019-01-15 DIAGNOSIS — K219 Gastro-esophageal reflux disease without esophagitis: Secondary | ICD-10-CM | POA: Diagnosis present

## 2019-01-15 DIAGNOSIS — Z20828 Contact with and (suspected) exposure to other viral communicable diseases: Secondary | ICD-10-CM | POA: Diagnosis present

## 2019-01-15 DIAGNOSIS — D631 Anemia in chronic kidney disease: Secondary | ICD-10-CM | POA: Diagnosis not present

## 2019-01-15 DIAGNOSIS — E785 Hyperlipidemia, unspecified: Secondary | ICD-10-CM | POA: Diagnosis not present

## 2019-01-15 DIAGNOSIS — Z7189 Other specified counseling: Secondary | ICD-10-CM | POA: Diagnosis not present

## 2019-01-15 DIAGNOSIS — Z90710 Acquired absence of both cervix and uterus: Secondary | ICD-10-CM | POA: Diagnosis not present

## 2019-01-15 DIAGNOSIS — J45909 Unspecified asthma, uncomplicated: Secondary | ICD-10-CM | POA: Diagnosis present

## 2019-01-15 DIAGNOSIS — Z818 Family history of other mental and behavioral disorders: Secondary | ICD-10-CM

## 2019-01-15 DIAGNOSIS — Z9049 Acquired absence of other specified parts of digestive tract: Secondary | ICD-10-CM

## 2019-01-15 DIAGNOSIS — Z23 Encounter for immunization: Secondary | ICD-10-CM

## 2019-01-15 DIAGNOSIS — I200 Unstable angina: Secondary | ICD-10-CM | POA: Diagnosis present

## 2019-01-15 DIAGNOSIS — I10 Essential (primary) hypertension: Secondary | ICD-10-CM

## 2019-01-15 DIAGNOSIS — Z794 Long term (current) use of insulin: Secondary | ICD-10-CM

## 2019-01-15 DIAGNOSIS — R079 Chest pain, unspecified: Secondary | ICD-10-CM | POA: Diagnosis not present

## 2019-01-15 DIAGNOSIS — I25110 Atherosclerotic heart disease of native coronary artery with unstable angina pectoris: Principal | ICD-10-CM | POA: Diagnosis present

## 2019-01-15 DIAGNOSIS — I201 Angina pectoris with documented spasm: Secondary | ICD-10-CM | POA: Diagnosis not present

## 2019-01-15 DIAGNOSIS — E7800 Pure hypercholesterolemia, unspecified: Secondary | ICD-10-CM | POA: Diagnosis present

## 2019-01-15 DIAGNOSIS — Z807 Family history of other malignant neoplasms of lymphoid, hematopoietic and related tissues: Secondary | ICD-10-CM

## 2019-01-15 DIAGNOSIS — Z79899 Other long term (current) drug therapy: Secondary | ICD-10-CM | POA: Diagnosis not present

## 2019-01-15 DIAGNOSIS — E876 Hypokalemia: Secondary | ICD-10-CM | POA: Diagnosis not present

## 2019-01-15 DIAGNOSIS — Z7982 Long term (current) use of aspirin: Secondary | ICD-10-CM

## 2019-01-15 DIAGNOSIS — Z833 Family history of diabetes mellitus: Secondary | ICD-10-CM

## 2019-01-15 DIAGNOSIS — H409 Unspecified glaucoma: Secondary | ICD-10-CM | POA: Diagnosis present

## 2019-01-15 DIAGNOSIS — Z85038 Personal history of other malignant neoplasm of large intestine: Secondary | ICD-10-CM | POA: Diagnosis not present

## 2019-01-15 DIAGNOSIS — E1165 Type 2 diabetes mellitus with hyperglycemia: Secondary | ICD-10-CM | POA: Diagnosis present

## 2019-01-15 DIAGNOSIS — D649 Anemia, unspecified: Secondary | ICD-10-CM | POA: Diagnosis not present

## 2019-01-15 DIAGNOSIS — E1122 Type 2 diabetes mellitus with diabetic chronic kidney disease: Secondary | ICD-10-CM | POA: Diagnosis present

## 2019-01-15 DIAGNOSIS — Z8669 Personal history of other diseases of the nervous system and sense organs: Secondary | ICD-10-CM

## 2019-01-15 DIAGNOSIS — I25118 Atherosclerotic heart disease of native coronary artery with other forms of angina pectoris: Secondary | ICD-10-CM | POA: Diagnosis not present

## 2019-01-15 DIAGNOSIS — Z8249 Family history of ischemic heart disease and other diseases of the circulatory system: Secondary | ICD-10-CM

## 2019-01-15 DIAGNOSIS — Z888 Allergy status to other drugs, medicaments and biological substances status: Secondary | ICD-10-CM | POA: Diagnosis not present

## 2019-01-15 DIAGNOSIS — Z955 Presence of coronary angioplasty implant and graft: Secondary | ICD-10-CM | POA: Diagnosis not present

## 2019-01-15 DIAGNOSIS — I129 Hypertensive chronic kidney disease with stage 1 through stage 4 chronic kidney disease, or unspecified chronic kidney disease: Secondary | ICD-10-CM | POA: Diagnosis not present

## 2019-01-15 DIAGNOSIS — R001 Bradycardia, unspecified: Secondary | ICD-10-CM

## 2019-01-15 DIAGNOSIS — N183 Chronic kidney disease, stage 3 unspecified: Secondary | ICD-10-CM | POA: Diagnosis not present

## 2019-01-15 DIAGNOSIS — R0602 Shortness of breath: Secondary | ICD-10-CM | POA: Diagnosis not present

## 2019-01-15 HISTORY — DX: Unstable angina: I20.0

## 2019-01-15 LAB — HEPATIC FUNCTION PANEL
ALT: 17 U/L (ref 0–44)
AST: 23 U/L (ref 15–41)
Albumin: 3.8 g/dL (ref 3.5–5.0)
Alkaline Phosphatase: 73 U/L (ref 38–126)
Bilirubin, Direct: <0.1 mg/dL (ref 0.0–0.2)
Total Bilirubin: 0.7 mg/dL (ref 0.3–1.2)
Total Protein: 7.2 g/dL (ref 6.5–8.1)

## 2019-01-15 LAB — SARS CORONAVIRUS 2 BY RT PCR (HOSPITAL ORDER, PERFORMED IN ~~LOC~~ HOSPITAL LAB): SARS Coronavirus 2: NEGATIVE

## 2019-01-15 LAB — BASIC METABOLIC PANEL
Anion gap: 12 (ref 5–15)
BUN: 41 mg/dL — ABNORMAL HIGH (ref 8–23)
CO2: 25 mmol/L (ref 22–32)
Calcium: 9.5 mg/dL (ref 8.9–10.3)
Chloride: 103 mmol/L (ref 98–111)
Creatinine, Ser: 1.67 mg/dL — ABNORMAL HIGH (ref 0.44–1.00)
GFR calc Af Amer: 35 mL/min — ABNORMAL LOW (ref >60)
GFR calc non Af Amer: 30 mL/min — ABNORMAL LOW (ref >60)
Glucose, Bld: 239 mg/dL — ABNORMAL HIGH (ref 70–99)
Potassium: 3.3 mmol/L — ABNORMAL LOW (ref 3.5–5.1)
Sodium: 140 mmol/L (ref 135–145)

## 2019-01-15 LAB — CBC
HCT: 36 % (ref 36.0–46.0)
Hemoglobin: 11.3 g/dL — ABNORMAL LOW (ref 12.0–15.0)
MCH: 27.4 pg (ref 26.0–34.0)
MCHC: 31.4 g/dL (ref 30.0–36.0)
MCV: 87.4 fL (ref 80.0–100.0)
Platelets: 205 10*3/uL (ref 150–400)
RBC: 4.12 MIL/uL (ref 3.87–5.11)
RDW: 13.9 % (ref 11.5–15.5)
WBC: 6.6 10*3/uL (ref 4.0–10.5)
nRBC: 0 % (ref 0.0–0.2)

## 2019-01-15 LAB — TSH: TSH: 0.755 u[IU]/mL (ref 0.350–4.500)

## 2019-01-15 LAB — TROPONIN I (HIGH SENSITIVITY)
Troponin I (High Sensitivity): 9 ng/L (ref <18)
Troponin I (High Sensitivity): 9 ng/L (ref <18)

## 2019-01-15 LAB — HEMOGLOBIN A1C
Hgb A1c MFr Bld: 8.5 % — ABNORMAL HIGH (ref 4.8–5.6)
Mean Plasma Glucose: 197.25 mg/dL

## 2019-01-15 LAB — BRAIN NATRIURETIC PEPTIDE: B Natriuretic Peptide: 81.2 pg/mL (ref 0.0–100.0)

## 2019-01-15 MED ORDER — ASPIRIN EC 81 MG PO TBEC: 81.0000 mg | DELAYED_RELEASE_TABLET | Freq: Every day | ORAL | Status: DC

## 2019-01-15 MED ORDER — ATORVASTATIN CALCIUM 80 MG PO TABS
80.0000 mg | ORAL_TABLET | Freq: Every day | ORAL | Status: DC
  Administered 2019-01-16 – 2019-01-17 (×2): 80 mg via ORAL
  Filled 2019-01-15 (×2): qty 1

## 2019-01-15 MED ORDER — HEPARIN BOLUS VIA INFUSION
3400.0000 [IU] | Freq: Once | INTRAVENOUS | Status: AC
  Administered 2019-01-15: 3400 [IU] via INTRAVENOUS
  Filled 2019-01-15: qty 3400

## 2019-01-15 MED ORDER — NITROGLYCERIN IN D5W 200-5 MCG/ML-% IV SOLN
0.0000 ug/min | INTRAVENOUS | Status: DC
  Administered 2019-01-15: 5 ug/min via INTRAVENOUS
  Filled 2019-01-15: qty 250

## 2019-01-15 MED ORDER — ASPIRIN 81 MG PO CHEW: 324.0000 mg | CHEWABLE_TABLET | ORAL | Status: AC

## 2019-01-15 MED ORDER — ONDANSETRON HCL 4 MG/2ML IJ SOLN: 4.0000 mg | Freq: Four times a day (QID) | INTRAMUSCULAR | Status: DC | PRN

## 2019-01-15 MED ORDER — AMLODIPINE BESYLATE 5 MG PO TABS: 5.0000 mg | ORAL_TABLET | Freq: Every day | ORAL | Status: DC

## 2019-01-15 MED ORDER — ASPIRIN 300 MG RE SUPP: 300.0000 mg | RECTAL | Status: AC

## 2019-01-15 MED ORDER — ACETAMINOPHEN 325 MG PO TABS: 650.0000 mg | ORAL_TABLET | ORAL | Status: DC | PRN

## 2019-01-15 MED ORDER — NITROGLYCERIN 0.4 MG SL SUBL: 0.4000 mg | SUBLINGUAL_TABLET | SUBLINGUAL | Status: DC | PRN

## 2019-01-15 MED ORDER — NITROGLYCERIN IN D5W 200-5 MCG/ML-% IV SOLN
0.0000 ug/min | INTRAVENOUS | Status: DC
  Administered 2019-01-15: 5 ug/min via INTRAVENOUS

## 2019-01-15 MED ORDER — ASPIRIN 81 MG PO CHEW
324.0000 mg | CHEWABLE_TABLET | Freq: Once | ORAL | Status: AC
  Administered 2019-01-15: 243 mg via ORAL
  Filled 2019-01-15: qty 4

## 2019-01-15 MED ORDER — HEPARIN (PORCINE) 25000 UT/250ML-% IV SOLN
700.0000 [IU]/h | INTRAVENOUS | Status: DC
  Administered 2019-01-15 – 2019-01-16 (×2): 700 [IU]/h via INTRAVENOUS
  Filled 2019-01-15 (×2): qty 250

## 2019-01-15 MED ORDER — CLOPIDOGREL BISULFATE 75 MG PO TABS
75.0000 mg | ORAL_TABLET | Freq: Every day | ORAL | Status: DC
  Administered 2019-01-15 – 2019-01-17 (×3): 75 mg via ORAL
  Filled 2019-01-15 (×4): qty 1

## 2019-01-15 NOTE — Progress Notes (Signed)
ANTICOAGULATION CONSULT NOTE - Initial Consult  Pharmacy Consult for Heparin Indication: chest pain/ACS  Allergies  Allergen Reactions  . Metformin And Related Diarrhea    Patient Measurements: Height: 5\' 4"  (162.6 cm) Weight: 126 lb (57.2 kg) IBW/kg (Calculated) : 54.7 Heparin Dosing Weight: 57.2  Vital Signs: Temp: 98.6 F (37 C) (10/06 1630) Temp Source: Oral (10/06 1630) BP: 127/70 (10/06 1630) Pulse Rate: 53 (10/06 1630)  Labs: No results for input(s): HGB, HCT, PLT, APTT, LABPROT, INR, HEPARINUNFRC, HEPRLOWMOCWT, CREATININE, CKTOTAL, CKMB, TROPONINIHS in the last 72 hours.  CrCl cannot be calculated (Patient's most recent lab result is older than the maximum 21 days allowed.).   Medical History: Past Medical History:  Diagnosis Date  . Allergy   . Asthma   . Blood transfusion without reported diagnosis 12/23/2004   with colon cancer  . Brain tumor (benign) ([place])   . Colon cancer [facility]) Nov. 27,  2006  . Diabetes mellitus    Type 2  . Female bladder prolapse   . Gastritis   . GERD (gastroesophageal reflux disease)   . Glaucoma   . Hypercholesterolemia   . Hypertension     Medications:  Scheduled:  . aspirin  324 mg Oral Once   Infusions:  . nitroGLYCERIN      Assessment: [name] is a 73 y/o female with a PMH of DMII, HTN, HLD, CAD, angina, and prior brain tumor. She is having increasingly frequent chest pain, with her most recent episode starting around 3:30 AM on 10/06. She was transferred to the ED via EMS from her [facility] visit due to an abnormal EKG. VSS are stable. Pharmacy has been consulted to dose heparin. Patient was not on anticoagulation PTA, however, she was on DAPT.   Goal of Therapy:  Heparin level 0.3-0.7 units/ml Monitor platelets by anticoagulation protocol: Yes   Plan:  Give 3400 units bolus x 1 Start heparin infusion at 700 units/hr Check anti-Xa level in 6 hours and daily while on heparin Continue to monitor H&H and  platelets  Sherren Kerns, PharmD PGY1 Acute Care Pharmacy Resident 01/15/2019,4:58 PM

## 2019-01-15 NOTE — H&P (Signed)
Cardiology Admission History and Physical   Date:  01/15/2019   ID:  [name], DOB 1945-06-18, MRN [id number]  PCP:  [name], MD  Cardiologist:  Sinclair Grooms, MD   Referring MD: [name], MD   Chief Complaint  Patient presents with   Coronary Artery Disease    Unstable angina    History of Present Illness:    [name] is a 73 y.o. female with a hx of multiple cardiac risk factors including type 2 diabetes, hypertension, hyperlipidemia, CAD with angina pectoris (cath 04/17/18 - 90% apical LAD, 50-70% Cfx, RCA, & PDA), and prior brain tumor.  Patient has been having increasingly frequent chest discomfort.  She was awakened at around 3:30 AM with a lingering Tension in my left upper chest" that has persisted ever since.  Fortuitously, she had an office visit today.  She still has grade 2-3 over 10 chest discomfort.  Denies orthopnea, PND, and aggravation of the chest discomfort with ambulation.  She has prior history of moderate nonobstructive coronary disease as outlined below.  Since cardiac cath she has continued to have angina with activity.  Quality of life has suffered because discomfort has become more and more restrictive.  The discomfort that is present today is very similar to her initial complaints when we first met.  She has not tried nitroglycerin for the chest discomfort today.  She uses the nitroglycerin infrequently.    Past Medical History:  Diagnosis Date   Allergy    Asthma    Blood transfusion without reported diagnosis 12/23/2004   with colon cancer   Brain tumor (benign) ([place])    Colon cancer ([place]) Nov. 27,  2006   Diabetes mellitus    Type 2   Female bladder prolapse    Gastritis    GERD (gastroesophageal reflux disease)    Glaucoma    Hypercholesterolemia    Hypertension     Past Surgical History:  Procedure Laterality Date   ABDOMINAL HYSTERECTOMY  1976   BALLOON DILATION N/A 08/23/2017   Procedure: BALLOON  DILATION;  Surgeon: Milus Banister, MD;  [facility];  Service: Endoscopy;  Laterality: N/A;  Esophageal Diltation   bladder tac     CARDIAC CATHETERIZATION  2005   CATARACT EXTRACTION, BILATERAL     COLONOSCOPY W/ BIOPSIES     ESOPHAGEAL MANOMETRY N/A 03/20/2015   Procedure: ESOPHAGEAL MANOMETRY (EM);  Surgeon: Gatha Mayer, MD;  [facility];  Service: Endoscopy;  Laterality: N/A;   ESOPHAGOGASTRODUODENOSCOPY (EGD) WITH PROPOFOL N/A 08/23/2017   Procedure: ESOPHAGOGASTRODUODENOSCOPY (EGD) WITH PROPOFOL;  Surgeon: Milus Banister, MD;  [facility];  Service: Endoscopy;  Laterality: N/A;   EYE SURGERY     mole removed left eye   LAPAROSCOPIC NISSEN FUNDOPLICATION  8466   LEFT HEART CATH AND CORONARY ANGIOGRAPHY N/A 04/17/2018   Procedure: LEFT HEART CATH AND CORONARY ANGIOGRAPHY;  Surgeon: Belva Crome, MD;  Location: [place] [facility];  Service: Cardiovascular;  Laterality: N/A;   repair prolapsedbladder     RIGHT COLECTOMY  Nov. 27, 2006   TMJ ARTHROPLASTY     UPPER GASTROINTESTINAL ENDOSCOPY      Current Medications: Current Meds  Medication Sig   amLODipine (NORVASC) 5 MG tablet Take 1 tablet (5 mg total) by mouth daily.   aspirin 81 MG chewable tablet Chew 81 mg by mouth daily.     Blood Glucose Monitoring Suppl (ONETOUCH VERIO) w/Device KIT 1 each by  Does not apply route daily.   clopidogrel (PLAVIX) 75 MG tablet Take 1 tablet (75 mg total) by mouth daily.   diclofenac sodium (VOLTAREN) 1 % GEL Apply 2 g topically 4 (four) times daily.   empagliflozin (JARDIANCE) 10 MG TABS tablet Take 10 mg by mouth daily.   glucose blood (ONETOUCH VERIO) test strip Use as instructed   Insulin Glargine (LANTUS SOLOSTAR) 100 UNIT/ML Solostar Pen Inject 15 Units into the skin daily at 10 pm.   Insulin Pen Needle 31G X 5 MM MISC Check blood sugar once daily   isosorbide mononitrate (IMDUR) 30 MG 24 hr tablet Take 1 tablet (30 mg total) by  mouth daily.   Lancet Devices (ONE TOUCH DELICA LANCING DEV) MISC Check blood sugar once daily   nitroGLYCERIN (NITROSTAT) 0.4 MG SL tablet Place 1 tablet (0.4 mg total) under the tongue every 5 (five) minutes as needed. If chest pain not relieved by third tab call 911.   ONETOUCH DELICA LANCETS 03K MISC CHECK BLOOD SUGAR ONCE DAILY   quinapril-hydrochlorothiazide (ACCURETIC) 20-12.5 MG tablet Take 2 tablets by mouth daily.   rosuvastatin (CRESTOR) 20 MG tablet Take 1 tablet (20 mg total) by mouth daily.     Allergies:   Metformin and related   Social History   Socioeconomic History   Marital status: Legally Separated    Spouse name: Not on file   Number of children: 2   Years of education: Not on file   Highest education level: Not on file  Occupational History   Not on file  Social Needs   Financial resource strain: Not on file   Food insecurity    Worry: Not on file    Inability: Not on file   Transportation needs    Medical: Not on file    Non-medical: Not on file  Tobacco Use   Smoking status: Never Smoker   Smokeless tobacco: Never Used  Substance and Sexual Activity   Alcohol use: No   Drug use: No   Sexual activity: Not on file  Lifestyle   Physical activity    Days per week: Not on file    Minutes per session: Not on file   Stress: Not on file  Relationships   Social connections    Talks on phone: Not on file    Gets together: Not on file    Attends religious service: Not on file    Active member of club or organization: Not on file    Attends meetings of clubs or organizations: Not on file    Relationship status: Not on file  Other Topics Concern   Not on file  Social History Narrative   Not on file     Family History: The patient's family history includes Bone cancer in her father; Diabetes in her daughter; Heart attack in her daughter; Hypertension in her daughter; Lymphoma in an other family member; Suicidality in her son.  There is no history of Colon cancer or Stomach cancer.  ROS:   Please see the history of present illness.    Diabetic.  Prior history of brain tumor.  All other systems reviewed and are negative.  EKGs/Labs/Other Studies Reviewed:    The following studies were reviewed today: Coronary angiography January 2020: Diagnostic Dominance: Right    EKG:  EKG EKG with sinus bradycardia, 52 bpm.  She has isolated ST elevation with T wave prominence in V2.  There are inferior T wave inversions that are new new compared to  prior tracings.  Discomfort is low-grade and mild when the EKG was performed.  Recent Labs: 04/12/2018: TSH 0.516 04/13/2018: Hemoglobin 10.8; Platelets 255 05/18/2018: BUN 29; Creatinine, Ser 1.42; Potassium 3.6; Sodium 138  Recent Lipid Panel    Component Value Date/Time   CHOL 84 (L) 04/12/2018 1443   TRIG 123 04/12/2018 1443   HDL 43 04/12/2018 1443   CHOLHDL 2.0 04/12/2018 1443   CHOLHDL 3.8 07/20/2015 1641   VLDL 74 (H) 07/20/2015 1641   LDLCALC 16 04/12/2018 1443    Physical Exam:    VS:  BP (!) 118/54    Pulse (!) 52    Ht 5' 4" (1.626 m)    Wt 129 lb 12.8 oz (58.9 kg)    SpO2 98%    BMI 22.28 kg/m     Wt Readings from Last 3 Encounters:  01/15/19 129 lb 12.8 oz (58.9 kg)  08/21/18 125 lb (56.7 kg)  06/07/18 126 lb 1.9 oz (57.2 kg)     GEN: African-American female who is not in extreme distress.  Skin is warm and dry.. No acute distress HEENT: Normal NECK: No JVD. LYMPHATICS: No lymphadenopathy CARDIAC:  RRR without murmur, gallop, or edema. VASCULAR:  Normal Pulses. No bruits. RESPIRATORY:  Clear to auscultation without rales, wheezing or rhonchi  ABDOMEN: Soft, non-tender, non-distended, No pulsatile mass, MUSCULOSKELETAL: No deformity  SKIN: Warm and dry NEUROLOGIC:  Alert and oriented x 3 PSYCHIATRIC:  Normal affect   ASSESSMENT:    1. Unstable angina ([place])   2. Coronary artery disease of native heart with stable angina pectoris, unspecified  vessel or lesion type ([place])   3. HYPERTENSION, BENIGN ESSENTIAL   4. Uncontrolled type 2 diabetes mellitus with hyperglycemia ([place])   5. Educated about COVID-19 virus infection    PLAN:    In order of problems listed above:  1. Near continuous discomfort for the past 12 hours.  EKG has some ST-T abnormality that is worrisome for ischemia.  Plan to admit the patient to the hospital.  She has diffuse moderately severe coronary disease by cath in January 2020.  Medical therapy has not improved limitations related to exertional discomfort.  She will be admitted to the hospital, serial markers will be performed, IV heparin will be started, and IV nitroglycerin.  She will likely need to have repeat coronary angiography. 2. Coronary substrate is noted above from cath in January.  She will need repeat cath to exclude progression. 3. Blood pressure is controlled. 4. Status of diabetes is unknown.  We will check a hemoglobin A1c. 5. She has been observing social distancing, masking, and handwashing.  Overall, it is difficult to tell on Mrs. [place].  I do not think she is having a STEMI but this possibly having a non-ST elevation MI.  EKG is different but not diagnostic for STEMI.  Given ongoing chest pain she needs to be admitted to the hospital and properly evaluated.   Medication Adjustments/Labs and Tests Ordered: Current medicines are reviewed at length with the patient today.  Concerns regarding medicines are outlined above.  No orders of the defined types were placed in this encounter.  No orders of the defined types were placed in this encounter.   There are no Patient Instructions on file for this visit.   Signed, Sinclair Grooms, MD  01/15/2019 3:53 PM    [place]

## 2019-01-15 NOTE — ED Triage Notes (Signed)
PER EMS: Pt arrives from [place] with c/o non-radiating left sided chest pain that has been intermittent since January. She was at a follow up visit at [facility] but sent here due to abnormal EKG. Pt A&Ox4. BP- 130/60, HR-60, 97% RA. Denies SOB.

## 2019-01-15 NOTE — ED Provider Notes (Signed)
[facility] EMERGENCY DEPARTMENT Provider Note   CSN: [id number] Arrival date & time: 01/15/19  1623     History   Chief Complaint Chief Complaint  Patient presents with  . Chest Pain    HPI [name] is a 73 y.o. female.     73 yo F with a chief complaints of chest pain.  Patient unfortunately has a history of coronary artery disease and had a recent cath.  Unfortunately she has had persistent chest pain is typical of her chest pain.  Described it as a left-sided squeezing to me.  Worse when she gets up and exerts herself.  She was seen by her cardiologist in the office was concerned about a new EKG change and had her sent here for admission.  The history is provided by the patient.  Chest Pain Pain location:  L chest Pain quality: pressure   Pain radiates to:  Does not radiate Pain severity:  Moderate Onset quality:  Gradual Duration:  2 weeks Timing:  Constant Progression:  Worsening Chronicity:  Recurrent Relieved by:  Nothing Worsened by:  Nothing Ineffective treatments:  None tried Associated symptoms: shortness of breath   Associated symptoms: no dizziness, no fever, no headache, no nausea, no palpitations and no vomiting     Past Medical History:  Diagnosis Date  . Allergy   . Asthma   . Blood transfusion without reported diagnosis 12/23/2004   with colon cancer  . Brain tumor (benign) ([place])   . Colon cancer [facility]) Nov. 27,  2006  . Diabetes mellitus    Type 2  . Female bladder prolapse   . Gastritis   . GERD (gastroesophageal reflux disease)   . Glaucoma   . Hypercholesterolemia   . Hypertension     Patient Active Problem List   Diagnosis Date Noted  . Unstable angina ([place]) 01/15/2019  . Arthritis of finger of right hand 05/24/2018  . Stable angina ([place]) 04/12/2018  . Pain in both feet 12/14/2017  . Cough 09/29/2017  . Benign esophageal stricture   . Health care maintenance 03/14/2016  . Memory difficulties 07/20/2015   . Dysphagia 11/17/2014  . Urine incontinence 03/07/2012  . Itching 01/09/2012  . Hematuria - cause not known 01/09/2012  . VERTIGO, CHRONIC 06/16/2009  . DM (diabetes mellitus), type 2, uncontrolled ([place]) 03/16/2007  . MENINGIOMA 02/07/2007  . HYPERTRIGLYCERIDEMIA 07/14/2006  . HYPERTENSION, BENIGN ESSENTIAL 07/14/2006  . Allergic rhinitis 07/14/2006  . History of colon cancer 06/08/2006  . ANEMIA, IRON DEFICIENCY, UNSPEC. 06/08/2006  . DEPRESSIVE DISORDER, NOS 06/08/2006  . CAD (coronary artery disease) 06/08/2006  . GASTROESOPHAGEAL REFLUX, NO ESOPHAGITIS 06/08/2006  . FIBROADENOSIS, BREAST 06/08/2006  . EXTERNAL HEMORRHOIDS 10/23/2001  . COLONIC POLYPS, ADENOMATOUS, BENIGN 10/23/2001    Past Surgical History:  Procedure Laterality Date  . ABDOMINAL HYSTERECTOMY  1976  . BALLOON DILATION N/A 08/23/2017   Procedure: BALLOON DILATION;  Surgeon: Milus Banister, MD;  Location: [facility];  Service: Endoscopy;  Laterality: N/A;  Esophageal Diltation  . bladder tac    . CARDIAC CATHETERIZATION  2005  . CATARACT EXTRACTION, BILATERAL    . COLONOSCOPY W/ BIOPSIES    . ESOPHAGEAL MANOMETRY N/A 03/20/2015   Procedure: ESOPHAGEAL MANOMETRY (EM);  Surgeon: Gatha Mayer, MD;  Location: [facility];  Service: Endoscopy;  Laterality: N/A;  . ESOPHAGOGASTRODUODENOSCOPY (EGD) WITH PROPOFOL N/A 08/23/2017   Procedure: ESOPHAGOGASTRODUODENOSCOPY (EGD) WITH PROPOFOL;  Surgeon: Milus Banister, MD;  Location: [facility];  Service: Endoscopy;  Laterality: N/A;  . EYE SURGERY     mole removed left eye  . LAPAROSCOPIC NISSEN FUNDOPLICATION  5726  . LEFT HEART CATH AND CORONARY ANGIOGRAPHY N/A 04/17/2018   Procedure: LEFT HEART CATH AND CORONARY ANGIOGRAPHY;  Surgeon: Belva Crome, MD;  Location: [facility];  Service: Cardiovascular;  Laterality: N/A;  . repair prolapsedbladder    . RIGHT COLECTOMY  Nov. 27, 2006  . TMJ ARTHROPLASTY    . UPPER GASTROINTESTINAL ENDOSCOPY       OB  History   No obstetric history on file.      Home Medications    Prior to Admission medications   Medication Sig Start Date End Date Taking? Authorizing Provider  amLODipine (NORVASC) 5 MG tablet Take 1 tablet (5 mg total) by mouth daily. 06/07/18   Isaiah Serge, NP  aspirin 81 MG chewable tablet Chew 81 mg by mouth daily.      [provider]  Blood Glucose Monitoring Suppl (ONETOUCH VERIO) w/Device KIT 1 each by Does not apply route daily. 07/05/17   Mayo, Pete Pelt, MD  clopidogrel (PLAVIX) 75 MG tablet Take 1 tablet (75 mg total) by mouth daily. 10/30/18   Belva Crome, MD  diclofenac sodium (VOLTAREN) 1 % GEL Apply 2 g topically 4 (four) times daily. 05/24/18   [name], MD  empagliflozin (JARDIANCE) 10 MG TABS tablet Take 10 mg by mouth daily. 01/01/19   [name], MD  glucose blood [facility] VERIO) test strip Use as instructed 12/14/17   [name], MD  Insulin Glargine (LANTUS SOLOSTAR) 100 UNIT/ML Solostar Pen Inject 15 Units into the skin daily at 10 pm. 01/10/19   [name], MD  Insulin Pen Needle 31G X 5 MM MISC Check blood sugar once daily 08/08/17   Mayo, Pete Pelt, MD  isosorbide mononitrate (IMDUR) 30 MG 24 hr tablet Take 1 tablet (30 mg total) by mouth daily. 08/21/18   Belva Crome, MD  Lancet Devices (ONE TOUCH DELICA LANCING DEV) MISC Check blood sugar once daily 07/05/17   Mayo, Pete Pelt, MD  nitroGLYCERIN (NITROSTAT) 0.4 MG SL tablet Place 1 tablet (0.4 mg total) under the tongue every 5 (five) minutes as needed. If chest pain not relieved by third tab call 911. 05/24/18   [name], MD  [place] LANCETS 20B MISC CHECK BLOOD SUGAR ONCE DAILY 10/05/17   Mayo, Pete Pelt, MD  quinapril-hydrochlorothiazide (ACCURETIC) 20-12.5 MG tablet Take 2 tablets by mouth daily. 05/10/18   Belva Crome, MD  rosuvastatin (CRESTOR) 20 MG tablet Take 1 tablet (20 mg total) by mouth daily. 06/15/18   [name], MD    Family History Family History  Problem  Relation Age of Onset  . Lymphoma Other   . Bone cancer Father   . Diabetes Daughter   . Hypertension Daughter   . Heart attack Daughter   . Suicidality Son        deceased  . Colon cancer Neg Hx   . Stomach cancer Neg Hx     Social History Social History   Tobacco Use  . Smoking status: Never Smoker  . Smokeless tobacco: Never Used  Substance Use Topics  . Alcohol use: No  . Drug use: No     Allergies   Metformin and related   Review of Systems Review of Systems  Constitutional: Negative for chills and fever.  HENT: Negative for congestion and rhinorrhea.   Eyes: Negative for redness and visual  disturbance.  Respiratory: Positive for shortness of breath. Negative for wheezing.   Cardiovascular: Positive for chest pain. Negative for palpitations.  Gastrointestinal: Negative for nausea and vomiting.  Genitourinary: Negative for dysuria and urgency.  Musculoskeletal: Negative for arthralgias and myalgias.  Skin: Negative for pallor and wound.  Neurological: Negative for dizziness and headaches.     Physical Exam Updated Vital Signs BP (!) 122/53   Pulse (!) 51   Temp 98.6 F (37 C) (Oral)   Resp 10   Ht 5' 4" (1.626 m)   Wt 57.2 kg   SpO2 100%   BMI 21.63 kg/m   Physical Exam Vitals signs and nursing note reviewed.  Constitutional:      General: She is not in acute distress.    Appearance: She is well-developed. She is not diaphoretic.  HENT:     Head: Normocephalic and atraumatic.  Eyes:     Pupils: Pupils are equal, round, and reactive to light.  Neck:     Musculoskeletal: Normal range of motion and neck supple.  Cardiovascular:     Rate and Rhythm: Normal rate and regular rhythm.     Heart sounds: No murmur. No friction rub. No gallop.   Pulmonary:     Effort: Pulmonary effort is normal.     Breath sounds: No wheezing or rales.  Abdominal:     General: There is no distension.     Palpations: Abdomen is soft.     Tenderness: There is no  abdominal tenderness.  Musculoskeletal:        General: No tenderness.  Skin:    General: Skin is warm and dry.  Neurological:     Mental Status: She is alert and oriented to person, place, and time.  Psychiatric:        Behavior: Behavior normal.      ED Treatments / Results  Labs (all labs ordered are listed, but only abnormal results are displayed) Labs Reviewed  CBC - Abnormal; Notable for the following components:      Result Value   Hemoglobin 11.3 (*)    All other components within normal limits  SARS CORONAVIRUS 2 (HOSPITAL ORDER, [facility])  BASIC METABOLIC PANEL  HEPARIN LEVEL (UNFRACTIONATED)  HEPARIN LEVEL (UNFRACTIONATED)  CBC  COMPREHENSIVE METABOLIC PANEL  TSH  HEMOGLOBIN A1C  BRAIN NATRIURETIC PEPTIDE  CBC WITH DIFFERENTIAL/PLATELET  BASIC METABOLIC PANEL  TROPONIN I (HIGH SENSITIVITY)  TROPONIN I (HIGH SENSITIVITY)  TROPONIN I (HIGH SENSITIVITY)    EKG EKG Interpretation  Date/Time:  Tuesday January 15 2019 16:29:06 EDT Ventricular Rate:  49 PR Interval:    QRS Duration: 77 QT Interval:  445 QTC Calculation: 402 R Axis:   38 Text Interpretation:  Sinus bradycardia Low voltage, precordial leads Borderline T wave abnormalities No significant change since last tracing Confirmed by Deno Etienne [phone]) on 01/15/2019 4:34:01 PM   Radiology Dg Chest 2 View  Result Date: 01/15/2019 CLINICAL DATA:  Intermittent chest pain since January. EXAM: CHEST - 2 VIEW COMPARISON:  Chest x-ray 08/19/2017 FINDINGS: The cardiac silhouette, mediastinal and hilar contours are within normal limits and stable. Mild tortuosity of the thoracic aorta with scattered calcifications. Biapical pleural and parenchymal scarring type changes appears stable when compared to prior chest films. No infiltrates or effusions. No worrisome pulmonary lesions. The bony thorax is intact. IMPRESSION: 1. No acute cardiopulmonary findings. 2. Biapical paratracheal  scarring type changes appears stable. Electronically Signed   By: Ricky Stabs.D.  On: 01/15/2019 17:21    Procedures Procedures (including critical care time)  Medications Ordered in ED Medications  nitroGLYCERIN 50 mg in dextrose 5 % 250 mL (0.2 mg/mL) infusion (5 mcg/min Intravenous New Bag/Given 01/15/19 1809)  heparin ADULT infusion 100 units/mL (25000 units/291m sodium chloride 0.45%) (700 Units/hr Intravenous New Bag/Given 01/15/19 1811)  aspirin chewable tablet 324 mg (324 mg Oral Not Given 01/15/19 1812)    Or  aspirin suppository 300 mg ( Rectal See Alternative 01/15/19 1812)  aspirin EC tablet 81 mg (has no administration in time range)  nitroGLYCERIN (NITROSTAT) SL tablet 0.4 mg (has no administration in time range)  acetaminophen (TYLENOL) tablet 650 mg (has no administration in time range)  ondansetron (ZOFRAN) injection 4 mg (has no administration in time range)  nitroGLYCERIN 50 mg in dextrose 5 % 250 mL (0.2 mg/mL) infusion (has no administration in time range)  amLODipine (NORVASC) tablet 5 mg (has no administration in time range)  clopidogrel (PLAVIX) tablet 75 mg (has no administration in time range)  atorvastatin (LIPITOR) tablet 80 mg (has no administration in time range)  aspirin chewable tablet 324 mg (243 mg Oral Given 01/15/19 1805)  heparin bolus via infusion 3,400 Units (3,400 Units Intravenous Bolus from Bag 01/15/19 1812)     Initial Impression / Assessment and Plan / ED Course  I have reviewed the triage vital signs and the nursing notes.  Pertinent labs & imaging results that were available during my care of the patient were reviewed by me and considered in my medical decision making (see chart for details).        73yo F with a chief complaints of chest pain.  Patient had been seen by her cardiologist earlier today and was sent to the ED for admission.  In their note they recommended starting her on nitro and heparin.  Patient takes a baby aspirin  and so I gave her a full dose aspirin here.  Globin is came back unremarkable.  Cards to admit.  CRITICAL CARE Performed by: DCecilio Asper  Total critical care time: 35 minutes  Critical care time was exclusive of separately billable procedures and treating other patients.  Critical care was necessary to treat or prevent imminent or life-threatening deterioration.  Critical care was time spent personally by me on the following activities: development of treatment plan with patient and/or surrogate as well as nursing, discussions with consultants, evaluation of patient's response to treatment, examination of patient, obtaining history from patient or surrogate, ordering and performing treatments and interventions, ordering and review of laboratory studies, ordering and review of radiographic studies, pulse oximetry and re-evaluation of patient's condition.  The patients results and plan were reviewed and discussed.   Any x-rays performed were independently reviewed by myself.   Differential diagnosis were considered with the presenting HPI.  Medications  nitroGLYCERIN 50 mg in dextrose 5 % 250 mL (0.2 mg/mL) infusion (5 mcg/min Intravenous New Bag/Given 01/15/19 1809)  heparin ADULT infusion 100 units/mL (25000 units/2587msodium chloride 0.45%) (700 Units/hr Intravenous New Bag/Given 01/15/19 1811)  aspirin chewable tablet 324 mg (324 mg Oral Not Given 01/15/19 1812)    Or  aspirin suppository 300 mg ( Rectal See Alternative 01/15/19 1812)  aspirin EC tablet 81 mg (has no administration in time range)  nitroGLYCERIN (NITROSTAT) SL tablet 0.4 mg (has no administration in time range)  acetaminophen (TYLENOL) tablet 650 mg (has no administration in time range)  ondansetron (ZOFRAN) injection 4 mg (has no administration in  time range)  nitroGLYCERIN 50 mg in dextrose 5 % 250 mL (0.2 mg/mL) infusion (has no administration in time range)  amLODipine (NORVASC) tablet 5 mg (has no  administration in time range)  clopidogrel (PLAVIX) tablet 75 mg (has no administration in time range)  atorvastatin (LIPITOR) tablet 80 mg (has no administration in time range)  aspirin chewable tablet 324 mg (243 mg Oral Given 01/15/19 1805)  heparin bolus via infusion 3,400 Units (3,400 Units Intravenous Bolus from Bag 01/15/19 1812)    Vitals:   01/15/19 1630 01/15/19 1630 01/15/19 1645 01/15/19 1715  BP: (!) 135/55 127/70 (!) 147/48 (!) 122/53  Pulse: (!) 51 (!) 53 (!) 53 (!) 51  Resp: _0 Temp:  98.6 F (37 C)    TempSrc:  Oral    SpO2: 100% 100% 98% 100%  Weight:      Height:        Final diagnoses:  Unstable angina (HCC)    Admission/ observation were discussed with the admitting physician, patient and/or family and they are comfortable with the plan.   Final Clinical Impressions(s) / ED Diagnoses   Final diagnoses:  Unstable angina [facility])    ED Discharge Orders    None       Deno Etienne, DO 01/15/19 [facility]

## 2019-01-16 DIAGNOSIS — R001 Bradycardia, unspecified: Secondary | ICD-10-CM

## 2019-01-16 DIAGNOSIS — D649 Anemia, unspecified: Secondary | ICD-10-CM

## 2019-01-16 LAB — CBC WITH DIFFERENTIAL/PLATELET
Abs Immature Granulocytes: 0.02 10*3/uL (ref 0.00–0.07)
Basophils Absolute: 0 10*3/uL (ref 0.0–0.1)
Basophils Relative: 1 %
Eosinophils Absolute: 0.2 10*3/uL (ref 0.0–0.5)
Eosinophils Relative: 3 %
HCT: 28.3 % — ABNORMAL LOW (ref 36.0–46.0)
Hemoglobin: 9 g/dL — ABNORMAL LOW (ref 12.0–15.0)
Immature Granulocytes: 0 %
Lymphocytes Relative: 35 %
Lymphs Abs: 3 10*3/uL (ref 0.7–4.0)
MCH: 27.6 pg (ref 26.0–34.0)
MCHC: 31.8 g/dL (ref 30.0–36.0)
MCV: 86.8 fL (ref 80.0–100.0)
Monocytes Absolute: 0.7 10*3/uL (ref 0.1–1.0)
Monocytes Relative: 8 %
Neutro Abs: 4.5 10*3/uL (ref 1.7–7.7)
Neutrophils Relative %: 53 %
Platelets: 212 10*3/uL (ref 150–400)
RBC: 3.26 MIL/uL — ABNORMAL LOW (ref 3.87–5.11)
RDW: 13.9 % (ref 11.5–15.5)
WBC: 8.5 10*3/uL (ref 4.0–10.5)
nRBC: 0 % (ref 0.0–0.2)

## 2019-01-16 LAB — URINALYSIS, COMPLETE (UACMP) WITH MICROSCOPIC
Bilirubin Urine: NEGATIVE
Glucose, UA: >=500 mg/dL — AB
Ketones, ur: NEGATIVE mg/dL
Nitrite: NEGATIVE
Protein, ur: NEGATIVE mg/dL
Specific Gravity, Urine: 1.016 (ref 1.005–1.030)
pH: 5 (ref 5.0–8.0)

## 2019-01-16 LAB — FERRITIN: Ferritin: 33 ng/mL (ref 11–307)

## 2019-01-16 LAB — CBC
HCT: 28 % — ABNORMAL LOW (ref 36.0–46.0)
Hemoglobin: 8.5 g/dL — ABNORMAL LOW (ref 12.0–15.0)
MCH: 26.6 pg (ref 26.0–34.0)
MCHC: 30.4 g/dL (ref 30.0–36.0)
MCV: 87.8 fL (ref 80.0–100.0)
Platelets: 215 10*3/uL (ref 150–400)
RBC: 3.19 MIL/uL — ABNORMAL LOW (ref 3.87–5.11)
RDW: 14 % (ref 11.5–15.5)
WBC: 8.2 10*3/uL (ref 4.0–10.5)
nRBC: 0 % (ref 0.0–0.2)

## 2019-01-16 LAB — BASIC METABOLIC PANEL
Anion gap: 12 (ref 5–15)
BUN: 36 mg/dL — ABNORMAL HIGH (ref 8–23)
CO2: 25 mmol/L (ref 22–32)
Calcium: 9.2 mg/dL (ref 8.9–10.3)
Chloride: 107 mmol/L (ref 98–111)
Creatinine, Ser: 1.42 mg/dL — ABNORMAL HIGH (ref 0.44–1.00)
GFR calc Af Amer: 43 mL/min — ABNORMAL LOW (ref >60)
GFR calc non Af Amer: 37 mL/min — ABNORMAL LOW (ref >60)
Glucose, Bld: 110 mg/dL — ABNORMAL HIGH (ref 70–99)
Potassium: 3.3 mmol/L — ABNORMAL LOW (ref 3.5–5.1)
Sodium: 144 mmol/L (ref 135–145)

## 2019-01-16 LAB — POC OCCULT BLOOD, ED: Fecal Occult Bld: NEGATIVE

## 2019-01-16 LAB — GLUCOSE, CAPILLARY: Glucose-Capillary: 83 mg/dL (ref 70–99)

## 2019-01-16 LAB — IRON AND TIBC
Iron: 60 ug/dL (ref 28–170)
Saturation Ratios: 19 % (ref 10.4–31.8)
TIBC: 318 ug/dL (ref 250–450)
UIBC: 258 ug/dL

## 2019-01-16 LAB — HEMOGLOBIN AND HEMATOCRIT, BLOOD
HCT: 31.6 % — ABNORMAL LOW (ref 36.0–46.0)
Hemoglobin: 9.6 g/dL — ABNORMAL LOW (ref 12.0–15.0)

## 2019-01-16 LAB — HEPARIN LEVEL (UNFRACTIONATED)
Heparin Unfractionated: 0.6 IU/mL (ref 0.30–0.70)
Heparin Unfractionated: 0.63 IU/mL (ref 0.30–0.70)

## 2019-01-16 LAB — TROPONIN I (HIGH SENSITIVITY): Troponin I (High Sensitivity): 12 ng/L (ref <18)

## 2019-01-16 MED ORDER — SODIUM CHLORIDE 0.9% FLUSH: 3.0000 mL | INTRAVENOUS | Status: DC | PRN

## 2019-01-16 MED ORDER — SODIUM CHLORIDE 0.9 % WEIGHT BASED INFUSION: 3.0000 mL/kg/h | INTRAVENOUS | Status: AC

## 2019-01-16 MED ORDER — ASPIRIN 81 MG PO CHEW
81.0000 mg | CHEWABLE_TABLET | ORAL | Status: AC
  Administered 2019-01-16: 81 mg via ORAL
  Filled 2019-01-16: qty 1

## 2019-01-16 MED ORDER — ASPIRIN EC 81 MG PO TBEC
81.0000 mg | DELAYED_RELEASE_TABLET | Freq: Every day | ORAL | Status: DC
  Administered 2019-01-17: 81 mg via ORAL
  Filled 2019-01-16: qty 1

## 2019-01-16 MED ORDER — INFLUENZA VAC A&B SA ADJ QUAD 0.5 ML IM PRSY
0.5000 mL | PREFILLED_SYRINGE | INTRAMUSCULAR | Status: AC
  Administered 2019-01-17: 0.5 mL via INTRAMUSCULAR
  Filled 2019-01-16 (×2): qty 0.5

## 2019-01-16 MED ORDER — POTASSIUM CHLORIDE CRYS ER 20 MEQ PO TBCR
40.0000 meq | EXTENDED_RELEASE_TABLET | Freq: Once | ORAL | Status: AC
  Administered 2019-01-16: 40 meq via ORAL
  Filled 2019-01-16: qty 2

## 2019-01-16 MED ORDER — PNEUMOCOCCAL VAC POLYVALENT 25 MCG/0.5ML IJ INJ: 0.5000 mL | INJECTION | INTRAMUSCULAR | Status: DC

## 2019-01-16 MED ORDER — SODIUM CHLORIDE 0.9 % IV SOLN: 250.0000 mL | INTRAVENOUS | Status: DC | PRN

## 2019-01-16 MED ORDER — SODIUM CHLORIDE 0.9% FLUSH: 3.0000 mL | Freq: Two times a day (BID) | INTRAVENOUS | Status: DC

## 2019-01-16 MED ORDER — SODIUM CHLORIDE 0.9 % WEIGHT BASED INFUSION
1.0000 mL/kg/h | INTRAVENOUS | Status: DC
  Administered 2019-01-16: 1 mL/kg/h via INTRAVENOUS

## 2019-01-16 NOTE — Progress Notes (Addendum)
Progress Note  Patient Name: [name] Date of Encounter: 01/16/2019  Primary Cardiologist: Sinclair Grooms, MD   Subjective   O/N events: No acute events overnight. Started on heparin  [name] was examined and evaluated at bedside this AM in the ED. She was observed resting comfortably in bed. She states she developed chest pain at 3am yesterday morning and it has been intermittently persistent since. She denies any radiation, described as pressure in left side, denies any diaphoresis, nausea, vomiting. She mentions feeling weak and light-headed.   Inpatient Medications    Scheduled Meds: . amLODipine  5 mg Oral Daily  . aspirin  324 mg Oral NOW   Or  . aspirin  300 mg Rectal NOW  . [START ON 01/17/2019] aspirin EC  81 mg Oral Daily  . atorvastatin  80 mg Oral q1800  . clopidogrel  75 mg Oral Daily  . potassium chloride  40 mEq Oral Once   Continuous Infusions: . sodium chloride 1 mL/kg/hr (01/16/19 0835)  . heparin 700 Units/hr (01/15/19 1811)  . nitroGLYCERIN 5 mcg/min (01/15/19 1929)   PRN Meds: acetaminophen, nitroGLYCERIN, ondansetron (ZOFRAN) IV   Vital Signs    Vitals:   01/16/19 0700 01/16/19 0730 01/16/19 0745 01/16/19 0837  BP: (!) 108/50 (!) 111/46 (!) 104/44 (!) 109/53  Pulse: (!) 50 (!) 46 (!) 46 (!) 48  Resp: 15 11 17 14   Temp:      TempSrc:      SpO2: 100% 100% 99% 100%  Weight:      Height:       No intake or output data in the 24 hours ending 01/16/19 0910 Filed Weights   01/15/19 1627  Weight: 57.2 kg    Telemetry    Sinus bradycardia since admission, no arrythmias - Personally Reviewed  ECG    Sinus brady, noraml axis, isolated T wave inversion on lead 3, Flattened T-waves on V5,V6 similar to prior - Personally Reviewed  Physical Exam   Gen: Well-developed, well nourished, NAD HEENT: NCAT head, hearing intact, EOMI Neck: supple, ROM intact, no JVD CV: RRR, S1, S2 normal, No rubs, no murmurs, no gallops Pulm: CTAB, No  rales, no wheezes Abd: Soft, BS+, NTND, No rebound, no guarding Extm: ROM intact, Peripheral pulses intact, No peripheral edema Skin: Dry, Warm, normal turgor, no rashes, lesions, wounds.  Neuro: AAOx3   Labs    Chemistry Recent Labs  Lab 01/15/19 1737 01/15/19 1900 01/16/19 0435  NA 140  --  144  K 3.3*  --  3.3*  CL 103  --  107  CO2 25  --  25  GLUCOSE 239*  --  110*  BUN 41*  --  36*  CREATININE 1.67*  --  1.42*  CALCIUM 9.5  --  9.2  PROT  --  7.2  --   ALBUMIN  --  3.8  --   AST  --  23  --   ALT  --  17  --   ALKPHOS  --  73  --   BILITOT  --  0.7  --   GFRNONAA 30*  --  37*  GFRAA 35*  --  43*  ANIONGAP 12  --  12     Hematology Recent Labs  Lab 01/15/19 1737 01/15/19 2012 01/16/19 0435  WBC 6.6 8.5 8.2  RBC 4.12 3.26* 3.19*  HGB 11.3* 9.0* 8.5*  HCT 36.0 28.3* 28.0*  MCV 87.4 86.8 87.8  MCH 27.4 27.6  26.6  MCHC 31.4 31.8 30.4  RDW 13.9 13.9 14.0  PLT 205 212 215    Cardiac EnzymesNo results for input(s): TROPONINI in the last 168 hours. No results for input(s): TROPIPOC in the last 168 hours.   BNP Recent Labs  Lab 01/15/19 1900  BNP 81.2     DDimer No results for input(s): DDIMER in the last 168 hours.   Radiology    Dg Chest 2 View  Result Date: 01/15/2019 CLINICAL DATA:  Intermittent chest pain since January. EXAM: CHEST - 2 VIEW COMPARISON:  Chest x-ray 08/19/2017 FINDINGS: The cardiac silhouette, mediastinal and hilar contours are within normal limits and stable. Mild tortuosity of the thoracic aorta with scattered calcifications. Biapical pleural and parenchymal scarring type changes appears stable when compared to prior chest films. No infiltrates or effusions. No worrisome pulmonary lesions. The bony thorax is intact. IMPRESSION: 1. No acute cardiopulmonary findings. 2. Biapical paratracheal scarring type changes appears stable. Electronically Signed   By: Marijo Sanes M.D.   On: 01/15/2019 17:21    Cardiac Studies   04/2018  LHC  90% apical LAD.  The LAD contains luminal irregularities otherwise up to 30% in the mid vessel.  Normal left main.  50 to 70% distal circumflex before the dominant obtuse marginal.  50 to 60% segmental mid RCA.  60 to 70% diffuse mid PDA segment.  Normal LV function with EF 60%.  LVEDP was low.  Patient Profile     73 y.o. female w/ PMH of T2DM, HTN, CAD, HLD who presents for chest pain from Dr.Smith's office.   Assessment & Plan    Unstable Angina Hx of diffuse coronary artery disease confirmed on 04/2018 LHC. On medical management w/ aspirin, plavix. Presenting w/ ongoing chest pain. EKG w/ minimal changes. No troponin elevation. On heparin gtt. Scheduled for cath today. - If hgb trending down, stop heparin gtt - Hold off on cath until bleed can be ruled out - C/w aspirin, plavix, atorvastatin - Nitro for pain  Acute Normocytic Anemia Overnight hgb drop from 11.3->j8.5. Unclear etiology. Did not receive significant amount of fluid to blame dilution. No obvious source of bleed. Will need to r/o bleed prior to cath - Stat H&h - Stool occult, UA - Iron studies - Transfuse if <7  T2DM Hgb a1c 8.5. On jardiance, insulin at home. - SSI - Glucose checks  HTN Holding home antihypertensives due to soft pressures  CKD3  On chart review, prior GFR 9 months ago at 39%. Appears to be at baseline this admission creatinine 1.42, GFR 42% - Trend renal fx - Avoid nephrotoxic meds when able  Hypokalemia On admission K 3.3 - Replete k - check mag  For questions or updates, please contact [facility] Please consult [email or web address] for contact info under Cardiology/STEMI.      Signed, Gilberto Better, MD PGY-2, [facility] Pager: [phone] 01/16/2019, 9:10 AM    Attending attestation to follow

## 2019-01-16 NOTE — ED Notes (Signed)
Dinner tray ordered.

## 2019-01-16 NOTE — ED Notes (Signed)
ED TO INPATIENT HANDOFF REPORT  ED Nurse Name and Phone #: Hannie [phone]  S Name/Age/Gender [name] 73 y.o. female Room/Bed: 037C/037C  Code Status   Code Status: Full Code  Home/SNF/Other Home Patient oriented to: self, place, time and situation Is this baseline? Yes   Triage Complete: Triage complete  Chief Complaint CP  Triage Note PER EMS: Pt arrives from [facility] [place] with c/o non-radiating left sided chest pain that has been intermittent since January. She was at a follow up visit at [facility] but sent here due to abnormal EKG. Pt A&Ox4. BP- 130/60, HR-60, 97% RA. Denies SOB.   Allergies Allergies  Allergen Reactions  . Metformin And Related Diarrhea    Level of Care/Admitting Diagnosis ED Disposition    ED Disposition Condition [facility]: [place] [[id number]]  Level of Care: Progressive [102]  Covid Evaluation: Asymptomatic Screening Protocol (No Symptoms)  Diagnosis: Unstable angina [facility]) [[id number]]  Admitting Physician: Belva Crome [[id number]]  Attending Physician: Belva Crome [[id number]]  Estimated length of stay: past midnight tomorrow  Certification:: I certify this patient will need inpatient services for at least 2 midnights  Bed request comments: 6 E  PT Class (Do Not Modify): Inpatient [101]  PT Acc Code (Do Not Modify): Private [1]       B Medical/Surgery History Past Medical History:  Diagnosis Date  . Allergy   . Asthma   . Blood transfusion without reported diagnosis 12/23/2004   with colon cancer  . Brain tumor (benign) ([place])   . Colon cancer [facility]) Nov. 27,  2006  . Diabetes mellitus    Type 2  . Female bladder prolapse   . Gastritis   . GERD (gastroesophageal reflux disease)   . Glaucoma   . Hypercholesterolemia   . Hypertension    Past Surgical History:  Procedure Laterality Date  . ABDOMINAL HYSTERECTOMY  1976  . BALLOON DILATION N/A 08/23/2017   Procedure: BALLOON DILATION;  Surgeon:  Milus Banister, MD;  Location: [facility];  Service: Endoscopy;  Laterality: N/A;  Esophageal Diltation  . bladder tac    . CARDIAC CATHETERIZATION  2005  . CATARACT EXTRACTION, BILATERAL    . COLONOSCOPY W/ BIOPSIES    . ESOPHAGEAL MANOMETRY N/A 03/20/2015   Procedure: ESOPHAGEAL MANOMETRY (EM);  Surgeon: Gatha Mayer, MD;  Location: [facility];  Service: Endoscopy;  Laterality: N/A;  . ESOPHAGOGASTRODUODENOSCOPY (EGD) WITH PROPOFOL N/A 08/23/2017   Procedure: ESOPHAGOGASTRODUODENOSCOPY (EGD) WITH PROPOFOL;  Surgeon: Milus Banister, MD;  Location: [facility] ENDOSCOPY;  Service: Endoscopy;  Laterality: N/A;  . EYE SURGERY     mole removed left eye  . LAPAROSCOPIC NISSEN FUNDOPLICATION  9371  . LEFT HEART CATH AND CORONARY ANGIOGRAPHY N/A 04/17/2018   Procedure: LEFT HEART CATH AND CORONARY ANGIOGRAPHY;  Surgeon: Belva Crome, MD;  Location: [facility];  Service: Cardiovascular;  Laterality: N/A;  . repair prolapsedbladder    . RIGHT COLECTOMY  Nov. 27, 2006  . TMJ ARTHROPLASTY    . UPPER GASTROINTESTINAL ENDOSCOPY       A IV Location/Drains/Wounds Patient Lines/Drains/Airways Status   Active Line/Drains/Airways    Name:   Placement date:   Placement time:   Site:   Days:   Peripheral IV 01/15/19 Right Hand   01/15/19    1738    Hand   1   Peripheral IV 01/15/19 Right;Anterior Forearm   01/15/19    2046  Forearm   1          Intake/Output Last 24 hours  Intake/Output Summary (Last 24 hours) at 01/16/2019 1626 Last data filed at 01/16/2019 0600 Gross per 24 hour  Intake 26.32 ml  Output -  Net 26.32 ml    Labs/Imaging Results for orders placed or performed during the hospital encounter of 01/15/19 (from the past 48 hour(s))  Basic metabolic panel     Status: Abnormal   Collection Time: 01/15/19  5:37 PM  Result Value Ref Range   Sodium 140 135 - 145 mmol/L   Potassium 3.3 (L) 3.5 - 5.1 mmol/L   Chloride 103 98 - 111 mmol/L   CO2 25 22 - 32 mmol/L   Glucose,  Bld 239 (H) 70 - 99 mg/dL   BUN 41 (H) 8 - 23 mg/dL   Creatinine, Ser 1.67 (H) 0.44 - 1.00 mg/dL   Calcium 9.5 8.9 - 10.3 mg/dL   GFR calc non Af Amer 30 (L) >60 mL/min   GFR calc Af Amer 35 (L) >60 mL/min   Anion gap 12 5 - 15    Comment: Performed at [facility], [address] [place], [place] [address]  CBC     Status: Abnormal   Collection Time: 01/15/19  5:37 PM  Result Value Ref Range   WBC 6.6 4.0 - 10.5 K/uL   RBC 4.12 3.87 - 5.11 MIL/uL   Hemoglobin 11.3 (L) 12.0 - 15.0 g/dL   HCT 36.0 36.0 - 46.0 %   MCV 87.4 80.0 - 100.0 fL   MCH 27.4 26.0 - 34.0 pg   MCHC 31.4 30.0 - 36.0 g/dL   RDW 13.9 11.5 - 15.5 %   Platelets 205 150 - 400 K/uL   nRBC 0.0 0.0 - 0.2 %    Comment: Performed at [facility], [place] [address] [place], [place] [address]  Troponin I (High Sensitivity)     Status: None   Collection Time: 01/15/19  5:37 PM  Result Value Ref Range   Troponin I (High Sensitivity) 9 <18 ng/L    Comment: (NOTE) Elevated high sensitivity troponin I (hsTnI) values and significant  changes across serial measurements may suggest ACS but many other  chronic and acute conditions are known to elevate hsTnI results.  Refer to the "Links" section for chest pain algorithms and additional  guidance. Performed at [facility], [place] [address], [place], [place] [address]   SARS Coronavirus 2 [facility] order, Performed in [facility] hospital lab) Nasopharyngeal Nasopharyngeal Swab     Status: None   Collection Time: 01/15/19  5:42 PM   Specimen: Nasopharyngeal Swab  Result Value Ref Range   SARS Coronavirus 2 NEGATIVE NEGATIVE    Comment: (NOTE) If result is NEGATIVE SARS-CoV-2 target nucleic acids are NOT DETECTED. The SARS-CoV-2 RNA is generally detectable in upper and lower  respiratory specimens during the acute phase of infection. The lowest  concentration of SARS-CoV-2 viral copies this assay can detect is 250  copies / mL. A negative result does not  preclude SARS-CoV-2 infection  and should not be used as the sole basis for treatment or other  patient management decisions.  A negative result may occur with  improper specimen collection / handling, submission of specimen other  than nasopharyngeal swab, presence of viral mutation(s) within the  areas targeted by this assay, and inadequate number of viral copies  (<250 copies / mL). A negative result must be combined  with clinical  observations, patient history, and epidemiological information. If result is POSITIVE SARS-CoV-2 target nucleic acids are DETECTED. The SARS-CoV-2 RNA is generally detectable in upper and lower  respiratory specimens dur ing the acute phase of infection.  Positive  results are indicative of active infection with SARS-CoV-2.  Clinical  correlation with patient history and other diagnostic information is  necessary to determine patient infection status.  Positive results do  not rule out bacterial infection or co-infection with other viruses. If result is PRESUMPTIVE POSTIVE SARS-CoV-2 nucleic acids MAY BE PRESENT.   A presumptive positive result was obtained on the submitted specimen  and confirmed on repeat testing.  While 2019 novel coronavirus  (SARS-CoV-2) nucleic acids may be present in the submitted sample  additional confirmatory testing may be necessary for epidemiological  and / or clinical management purposes  to differentiate between  SARS-CoV-2 and other Sarbecovirus currently known to infect humans.  If clinically indicated additional testing with an alternate test  methodology [phone]) is advised. The SARS-CoV-2 RNA is generally  detectable in upper and lower respiratory sp ecimens during the acute  phase of infection. The expected result is Negative. Fact Sheet for Patients:  StrictlyIdeas.no Fact Sheet for Healthcare Providers: BankingDealers.co.za This test is not yet approved or cleared by  the [place] FDA and has been authorized for detection and/or diagnosis of SARS-CoV-2 by FDA under an Emergency Use Authorization (EUA).  This EUA will remain in effect (meaning this test can be used) for the duration of the COVID-19 declaration under Section 564(b)(1) of the Act, 21 U.S.C. section 360bbb-3(b)(1), unless the authorization is terminated or revoked sooner. Performed at [facility], [place] [address] [place], [place] [address]   Hemoglobin A1c     Status: Abnormal   Collection Time: 01/15/19  6:00 PM  Result Value Ref Range   Hgb A1c MFr Bld 8.5 (H) 4.8 - 5.6 %    Comment: (NOTE) Pre diabetes:          5.7%-6.4% Diabetes:              >6.4% Glycemic control for   <7.0% adults with diabetes    Mean Plasma Glucose 197.25 mg/dL    Comment: Performed at [facility] [address] [place], [place] [address]  Brain natriuretic peptide     Status: None   Collection Time: 01/15/19  7:00 PM  Result Value Ref Range   B Natriuretic Peptide 81.2 0.0 - 100.0 pg/mL    Comment: Performed at [facility] [address], [place], [place] [address]  Hepatic function panel     Status: None   Collection Time: 01/15/19  7:00 PM  Result Value Ref Range   Total Protein 7.2 6.5 - 8.1 g/dL   Albumin 3.8 3.5 - 5.0 g/dL   AST 23 15 - 41 U/L   ALT 17 0 - 44 U/L   Alkaline Phosphatase 73 38 - 126 U/L   Total Bilirubin 0.7 0.3 - 1.2 mg/dL   Bilirubin, Direct <0.1 0.0 - 0.2 mg/dL   Indirect Bilirubin NOT CALCULATED 0.3 - 0.9 mg/dL    Comment: Performed at [facility] [address], [place], [place] [address]  TSH     Status: None   Collection Time: 01/15/19  8:12 PM  Result Value Ref Range   TSH 0.755 0.350 - 4.500 uIU/mL    Comment: Performed by a 3rd Generation assay with a functional sensitivity of <=  0.01 uIU/mL. Performed at [facility], [place] [address], [place], [place] [address]   Troponin I (High Sensitivity)     Status: None   Collection  Time: 01/15/19  8:12 PM  Result Value Ref Range   Troponin I (High Sensitivity) 9 <18 ng/L    Comment: (NOTE) Elevated high sensitivity troponin I (hsTnI) values and significant  changes across serial measurements may suggest ACS but many other  chronic and acute conditions are known to elevate hsTnI results.  Refer to the "Links" section for chest pain algorithms and additional  guidance. Performed at [facility], [place] [address] [place], [place] [address]   CBC WITH DIFFERENTIAL     Status: Abnormal   Collection Time: 01/15/19  8:12 PM  Result Value Ref Range   WBC 8.5 4.0 - 10.5 K/uL   RBC 3.26 (L) 3.87 - 5.11 MIL/uL   Hemoglobin 9.0 (L) 12.0 - 15.0 g/dL    Comment: REPEATED TO VERIFY   HCT 28.3 (L) 36.0 - 46.0 %   MCV 86.8 80.0 - 100.0 fL   MCH 27.6 26.0 - 34.0 pg   MCHC 31.8 30.0 - 36.0 g/dL   RDW 13.9 11.5 - 15.5 %   Platelets 212 150 - 400 K/uL   nRBC 0.0 0.0 - 0.2 %   Neutrophils Relative % 53 %   Neutro Abs 4.5 1.7 - 7.7 K/uL   Lymphocytes Relative 35 %   Lymphs Abs 3.0 0.7 - 4.0 K/uL   Monocytes Relative 8 %   Monocytes Absolute 0.7 0.1 - 1.0 K/uL   Eosinophils Relative 3 %   Eosinophils Absolute 0.2 0.0 - 0.5 K/uL   Basophils Relative 1 %   Basophils Absolute 0.0 0.0 - 0.1 K/uL   Immature Granulocytes 0 %   Abs Immature Granulocytes 0.02 0.00 - 0.07 K/uL    Comment: Performed at [facility] [address], [place], [place] [address]  Heparin level (unfractionated)     Status: None   Collection Time: 01/16/19 12:00 AM  Result Value Ref Range   Heparin Unfractionated 0.63 0.30 - 0.70 IU/mL    Comment: (NOTE) If heparin results are below expected values, and patient dosage has  been confirmed, suggest follow up testing of antithrombin III levels. Performed at [facility], [place] [address], [place], [place] [address]   CBC     Status: Abnormal   Collection Time: 01/16/19  4:35 AM  Result Value Ref Range   WBC 8.2 4.0 - 10.5 K/uL   RBC  3.19 (L) 3.87 - 5.11 MIL/uL   Hemoglobin 8.5 (L) 12.0 - 15.0 g/dL   HCT 28.0 (L) 36.0 - 46.0 %   MCV 87.8 80.0 - 100.0 fL   MCH 26.6 26.0 - 34.0 pg   MCHC 30.4 30.0 - 36.0 g/dL   RDW 14.0 11.5 - 15.5 %   Platelets 215 150 - 400 K/uL   nRBC 0.0 0.0 - 0.2 %    Comment: Performed at [facility], [place] [address], [place], [place] [address]  Basic metabolic panel     Status: Abnormal   Collection Time: 01/16/19  4:35 AM  Result Value Ref Range   Sodium 144 135 - 145 mmol/L   Potassium 3.3 (L) 3.5 - 5.1 mmol/L   Chloride 107 98 - 111 mmol/L   CO2 25 22 - 32 mmol/L   Glucose, Bld 110 (H) 70 - 99 mg/dL   BUN 36 (  H) 8 - 23 mg/dL   Creatinine, Ser 1.42 (H) 0.44 - 1.00 mg/dL   Calcium 9.2 8.9 - 10.3 mg/dL   GFR calc non Af Amer 37 (L) >60 mL/min   GFR calc Af Amer 43 (L) >60 mL/min   Anion gap 12 5 - 15    Comment: Performed at [facility] [address], [place], [place] [address]  Troponin I (High Sensitivity)     Status: None   Collection Time: 01/16/19  4:35 AM  Result Value Ref Range   Troponin I (High Sensitivity) 12 <18 ng/L    Comment: (NOTE) Elevated high sensitivity troponin I (hsTnI) values and significant  changes across serial measurements may suggest ACS but many other  chronic and acute conditions are known to elevate hsTnI results.  Refer to the "Links" section for chest pain algorithms and additional  guidance. Performed at [facility], [place] [address] [place], [place] [address]   Heparin level (unfractionated)     Status: None   Collection Time: 01/16/19 10:07 AM  Result Value Ref Range   Heparin Unfractionated 0.60 0.30 - 0.70 IU/mL    Comment: (NOTE) If heparin results are below expected values, and patient dosage has  been confirmed, suggest follow up testing of antithrombin III levels. Performed at [facility], [place] [address], [place], [place] [address]   Hemoglobin and hematocrit, blood     Status: Abnormal   Collection Time:  01/16/19 10:07 AM  Result Value Ref Range   Hemoglobin 9.6 (L) 12.0 - 15.0 g/dL   HCT 31.6 (L) 36.0 - 46.0 %    Comment: Performed at [facility], [place] [address] [place], [place] [address]  Iron and TIBC     Status: None   Collection Time: 01/16/19 10:07 AM  Result Value Ref Range   Iron 60 28 - 170 ug/dL   TIBC 318 250 - 450 ug/dL   Saturation Ratios 19 10.4 - 31.8 %   UIBC 258 ug/dL    Comment: Performed at [facility], [place] [address] [place], [place] [address]  Ferritin     Status: None   Collection Time: 01/16/19 10:07 AM  Result Value Ref Range   Ferritin 33 11 - 307 ng/mL    Comment: Performed at [facility], [place] [address], [place], [place] [address]  POC occult blood, ED     Status: None   Collection Time: 01/16/19 10:20 AM  Result Value Ref Range   Fecal Occult Bld NEGATIVE NEGATIVE  Urinalysis, Complete w Microscopic     Status: Abnormal   Collection Time: 01/16/19 11:06 AM  Result Value Ref Range   Color, Urine YELLOW YELLOW   APPearance HAZY (A) CLEAR   Specific Gravity, Urine 1.016 1.005 - 1.030   pH 5.0 5.0 - 8.0   Glucose, UA >=500 (A) NEGATIVE mg/dL   Hgb urine dipstick SMALL (A) NEGATIVE   Bilirubin Urine NEGATIVE NEGATIVE   Ketones, ur NEGATIVE NEGATIVE mg/dL   Protein, ur NEGATIVE NEGATIVE mg/dL   Nitrite NEGATIVE NEGATIVE   Leukocytes,Ua TRACE (A) NEGATIVE   RBC / HPF 0-5 0 - 5 RBC/hpf   WBC, UA 11-20 0 - 5 WBC/hpf   Bacteria, UA FEW (A) NONE SEEN   Squamous Epithelial / LPF 0-5 0 - 5    Comment: Performed at [facility], [address], [place],  [address]   Dg Chest 2 View  Result Date: 01/15/2019 CLINICAL DATA:  Intermittent chest pain since January. EXAM: CHEST - 2 VIEW COMPARISON:  Chest x-ray 08/19/2017 FINDINGS: The cardiac silhouette, mediastinal and hilar contours are within normal limits and stable. Mild tortuosity of the thoracic aorta with scattered calcifications. Biapical pleural and parenchymal  scarring type changes appears stable when compared to prior chest films. No infiltrates or effusions. No worrisome pulmonary lesions. The bony thorax is intact. IMPRESSION: 1. No acute cardiopulmonary findings. 2. Biapical paratracheal scarring type changes appears stable. Electronically Signed   By: Marijo Sanes M.D.   On: 01/15/2019 17:21    Pending Labs Unresulted Labs (From admission, onward)    Start     Ordered   01/17/19 0500  Heparin level (unfractionated)  Daily,   R     01/16/19 0111   01/16/19 0915  Occult blood card to lab, stool  Once,   STAT     01/16/19 0914   01/16/19 0500  CBC  Daily,   R     01/15/19 1707          Vitals/Pain Today's Vitals   01/16/19 1406 01/16/19 1406 01/16/19 1500 01/16/19 1529  BP:  (!) 131/47 (!) 117/51   Pulse:  (!) 48 (!) 47   Resp:  18 15   Temp:      TempSrc:      SpO2:  100% 100%   Weight:      Height:      PainSc: 0-No pain   0-No pain    Isolation Precautions No active isolations  Medications Medications  heparin ADULT infusion 100 units/mL (25000 units/268mL sodium chloride 0.45%) (700 Units/hr Intravenous New Bag/Given 01/15/19 1811)  aspirin chewable tablet 324 mg (324 mg Oral Not Given 01/15/19 1812)    Or  aspirin suppository 300 mg ( Rectal See Alternative 01/15/19 1812)  nitroGLYCERIN (NITROSTAT) SL tablet 0.4 mg (has no administration in time range)  acetaminophen (TYLENOL) tablet 650 mg (has no administration in time range)  ondansetron (ZOFRAN) injection 4 mg (has no administration in time range)  nitroGLYCERIN 50 mg in dextrose 5 % 250 mL (0.2 mg/mL) infusion (0 mcg/min Intravenous Stopped 01/16/19 0600)  clopidogrel (PLAVIX) tablet 75 mg (75 mg Oral Given 01/16/19 0836)  atorvastatin (LIPITOR) tablet 80 mg (has no administration in time range)  0.9% sodium chloride infusion (has no administration in time range)    Followed by  0.9% sodium chloride infusion (1 mL/kg/hr  57.2 kg Intravenous New Bag/Given 01/16/19  0835)  aspirin EC tablet 81 mg (has no administration in time range)  aspirin chewable tablet 324 mg (243 mg Oral Given 01/15/19 1805)  heparin bolus via infusion 3,400 Units (3,400 Units Intravenous Bolus from Bag 01/15/19 1812)  aspirin chewable tablet 81 mg (81 mg Oral Given 01/16/19 0837)  potassium chloride SA (KLOR-CON) CR tablet 40 mEq (40 mEq Oral Given 01/16/19 0958)    Mobility walks Low fall risk   Focused Assessments Cardiac Assessment Handoff:  Cardiac Rhythm: Sinus bradycardia Lab Results  Component Value Date   CKTOTAL 125 08/20/2017   CKMB 1.9 09/24/2010   TROPONINI <0.03 08/20/2017   Lab Results  Component Value Date   DDIMER  04/03/2007    <0.22        AT THE INHOUSE ESTABLISHED CUTOFF VALUE OF 0.48 ug/mL FEU, THIS ASSAY HAS BEEN DOCUMENTED IN THE LITERATURE TO HAVE   Does the Patient currently have chest pain? No     R Recommendations: See Admitting Provider Note  Report given to:   Additional Notes:

## 2019-01-16 NOTE — ED Notes (Signed)
Clear liquid breakfast tray ordered.

## 2019-01-16 NOTE — ED Notes (Signed)
Pt placed on hospital bed for comfort.

## 2019-01-16 NOTE — ED Notes (Signed)
Pt ambulatory to and from restroom with steady gait 

## 2019-01-16 NOTE — Plan of Care (Signed)
  Problem: Safety: Goal: Ability to remain free from injury will improve Outcome: Completed/Met

## 2019-01-16 NOTE — ED Notes (Signed)
Pt finished eating at 1020 am

## 2019-01-16 NOTE — ED Notes (Signed)
Breakfast tray at bedside; per Dr. Debara Pickett, pt NPO after this meal

## 2019-01-16 NOTE — Progress Notes (Signed)
[place] for Heparin Indication: chest pain/ACS  Allergies  Allergen Reactions  . Metformin And Related Diarrhea    Patient Measurements: Height: 5\' 4"  (162.6 cm) Weight: 126 lb (57.2 kg) IBW/kg (Calculated) : 54.7 Heparin Dosing Weight: 57.2  Vital Signs: Temp: 98.6 F (37 C) (10/06 1630) Temp Source: Oral (10/06 1630) BP: 111/53 (10/06 2345) Pulse Rate: 48 (10/06 2345)  Labs: Recent Labs    01/15/19 1737 01/15/19 2012 01/16/19 0000  HGB 11.3* 9.0*  --   HCT 36.0 28.3*  --   PLT 205 212  --   HEPARINUNFRC  --   --  0.63  CREATININE 1.67*  --   --   TROPONINIHS 9 9  --     Estimated Creatinine Clearance: 26.3 mL/min (A) (by C-G formula based on SCr of 1.67 mg/dL (H)).   Medical History: Past Medical History:  Diagnosis Date  . Allergy   . Asthma   . Blood transfusion without reported diagnosis 12/23/2004   with colon cancer  . Brain tumor (benign) ([place])   . Colon cancer [facility]) Nov. 27,  2006  . Diabetes mellitus    Type 2  . Female bladder prolapse   . Gastritis   . GERD (gastroesophageal reflux disease)   . Glaucoma   . Hypercholesterolemia   . Hypertension     Medications:  Scheduled:  . amLODipine  5 mg Oral Daily  . aspirin  324 mg Oral NOW   Or  . aspirin  300 mg Rectal NOW  . aspirin EC  81 mg Oral Daily  . atorvastatin  80 mg Oral q1800  . clopidogrel  75 mg Oral Daily   Infusions:  . heparin 700 Units/hr (01/15/19 1811)  . nitroGLYCERIN 5 mcg/min (01/15/19 1809)  . nitroGLYCERIN      Assessment: [name] is a 73 y/o female with a PMH of DMII, HTN, HLD, CAD, angina, and prior brain tumor. She is having increasingly frequent chest pain, with her most recent episode starting around 3:30 AM on 10/06. She was transferred to the ED via EMS from her [facility] visit due to an abnormal EKG. VSS are stable. Pharmacy has been consulted to dose heparin. Patient was not on anticoagulation PTA, however, she  was on DAPT.  Initial heparin level came back therapeutic at 0.63, on 700 units/hr. Hgb 9, plt 212. No s/sx of bleeding. No infusion issues.   Goal of Therapy:  Heparin level 0.3-0.7 units/ml Monitor platelets by anticoagulation protocol: Yes   Plan:  Continue heparin infusion at 700 units/hr Check anti-Xa level in 6 hours and daily while on heparin Continue to monitor H&H and platelets  Antonietta Jewel, PharmD, BCCCP Clinical Pharmacist   Please check AMION for all [facility] [place] [facility] phone numbers After 10:00 PM, call [place] [phone] 01/16/2019,1:07 AM

## 2019-01-16 NOTE — ED Notes (Signed)
Pt aware that a urine sample is needed.  

## 2019-01-16 NOTE — ED Notes (Signed)
Cardiology at bedside.

## 2019-01-16 NOTE — H&P (View-Only) (Signed)
Progress Note  Patient Name: [name] Date of Encounter: 01/16/2019  Primary Cardiologist: Sinclair Grooms, MD   Subjective   O/N events: No acute events overnight. Started on heparin  [name] was examined and evaluated at bedside this AM in the ED. She was observed resting comfortably in bed. She states she developed chest pain at 3am yesterday morning and it has been intermittently persistent since. She denies any radiation, described as pressure in left side, denies any diaphoresis, nausea, vomiting. She mentions feeling weak and light-headed.   Inpatient Medications    Scheduled Meds: . amLODipine  5 mg Oral Daily  . aspirin  324 mg Oral NOW   Or  . aspirin  300 mg Rectal NOW  . [START ON 01/17/2019] aspirin EC  81 mg Oral Daily  . atorvastatin  80 mg Oral q1800  . clopidogrel  75 mg Oral Daily  . potassium chloride  40 mEq Oral Once   Continuous Infusions: . sodium chloride 1 mL/kg/hr (01/16/19 0835)  . heparin 700 Units/hr (01/15/19 1811)  . nitroGLYCERIN 5 mcg/min (01/15/19 1929)   PRN Meds: acetaminophen, nitroGLYCERIN, ondansetron (ZOFRAN) IV   Vital Signs    Vitals:   01/16/19 0700 01/16/19 0730 01/16/19 0745 01/16/19 0837  BP: (!) 108/50 (!) 111/46 (!) 104/44 (!) 109/53  Pulse: (!) 50 (!) 46 (!) 46 (!) 48  Resp: 15 11 17 14   Temp:      TempSrc:      SpO2: 100% 100% 99% 100%  Weight:      Height:       No intake or output data in the 24 hours ending 01/16/19 0910 Filed Weights   01/15/19 1627  Weight: 57.2 kg    Telemetry    Sinus bradycardia since admission, no arrythmias - Personally Reviewed  ECG    Sinus brady, noraml axis, isolated T wave inversion on lead 3, Flattened T-waves on V5,V6 similar to prior - Personally Reviewed  Physical Exam   Gen: Well-developed, well nourished, NAD HEENT: NCAT head, hearing intact, EOMI Neck: supple, ROM intact, no JVD CV: RRR, S1, S2 normal, No rubs, no murmurs, no gallops Pulm: CTAB, No  rales, no wheezes Abd: Soft, BS+, NTND, No rebound, no guarding Extm: ROM intact, Peripheral pulses intact, No peripheral edema Skin: Dry, Warm, normal turgor, no rashes, lesions, wounds.  Neuro: AAOx3   Labs    Chemistry Recent Labs  Lab 01/15/19 1737 01/15/19 1900 01/16/19 0435  NA 140  --  144  K 3.3*  --  3.3*  CL 103  --  107  CO2 25  --  25  GLUCOSE 239*  --  110*  BUN 41*  --  36*  CREATININE 1.67*  --  1.42*  CALCIUM 9.5  --  9.2  PROT  --  7.2  --   ALBUMIN  --  3.8  --   AST  --  23  --   ALT  --  17  --   ALKPHOS  --  73  --   BILITOT  --  0.7  --   GFRNONAA 30*  --  37*  GFRAA 35*  --  43*  ANIONGAP 12  --  12     Hematology Recent Labs  Lab 01/15/19 1737 01/15/19 2012 01/16/19 0435  WBC 6.6 8.5 8.2  RBC 4.12 3.26* 3.19*  HGB 11.3* 9.0* 8.5*  HCT 36.0 28.3* 28.0*  MCV 87.4 86.8 87.8  MCH 27.4 27.6  26.6  MCHC 31.4 31.8 30.4  RDW 13.9 13.9 14.0  PLT 205 212 215    Cardiac EnzymesNo results for input(s): TROPONINI in the last 168 hours. No results for input(s): TROPIPOC in the last 168 hours.   BNP Recent Labs  Lab 01/15/19 1900  BNP 81.2     DDimer No results for input(s): DDIMER in the last 168 hours.   Radiology    Dg Chest 2 View  Result Date: 01/15/2019 CLINICAL DATA:  Intermittent chest pain since January. EXAM: CHEST - 2 VIEW COMPARISON:  Chest x-ray 08/19/2017 FINDINGS: The cardiac silhouette, mediastinal and hilar contours are within normal limits and stable. Mild tortuosity of the thoracic aorta with scattered calcifications. Biapical pleural and parenchymal scarring type changes appears stable when compared to prior chest films. No infiltrates or effusions. No worrisome pulmonary lesions. The bony thorax is intact. IMPRESSION: 1. No acute cardiopulmonary findings. 2. Biapical paratracheal scarring type changes appears stable. Electronically Signed   By: Marijo Sanes M.D.   On: 01/15/2019 17:21    Cardiac Studies   04/2018  LHC  90% apical LAD.  The LAD contains luminal irregularities otherwise up to 30% in the mid vessel.  Normal left main.  50 to 70% distal circumflex before the dominant obtuse marginal.  50 to 60% segmental mid RCA.  60 to 70% diffuse mid PDA segment.  Normal LV function with EF 60%.  LVEDP was low.  Patient Profile     73 y.o. female w/ PMH of T2DM, HTN, CAD, HLD who presents for chest pain from Dr.Smith's office.   Assessment & Plan    Unstable Angina Hx of diffuse coronary artery disease confirmed on 04/2018 LHC. On medical management w/ aspirin, plavix. Presenting w/ ongoing chest pain. EKG w/ minimal changes. No troponin elevation. On heparin gtt. Scheduled for cath today. - If hgb trending down, stop heparin gtt - Hold off on cath until bleed can be ruled out - C/w aspirin, plavix, atorvastatin - Nitro for pain  Acute Normocytic Anemia Overnight hgb drop from 11.3->j8.5. Unclear etiology. Did not receive significant amount of fluid to blame dilution. No obvious source of bleed. Will need to r/o bleed prior to cath - Stat H&h - Stool occult, UA - Iron studies - Transfuse if <7  T2DM Hgb a1c 8.5. On jardiance, insulin at home. - SSI - Glucose checks  HTN Holding home antihypertensives due to soft pressures  CKD3  On chart review, prior GFR 9 months ago at 39%. Appears to be at baseline this admission creatinine 1.42, GFR 42% - Trend renal fx - Avoid nephrotoxic meds when able  Hypokalemia On admission K 3.3 - Replete k - check mag  For questions or updates, please contact [facility] Please consult [email or web address] for contact info under Cardiology/STEMI.      Signed, Gilberto Better, MD PGY-2, [facility] Pager: [phone] 01/16/2019, 9:10 AM    Attending attestation to follow

## 2019-01-16 NOTE — Progress Notes (Signed)
ANTICOAGULATION CONSULT NOTE  Pharmacy Consult for Heparin Indication: chest pain/ACS   Patient Measurements: Height: 5\' 4"  (162.6 cm) Weight: 126 lb (57.2 kg) IBW/kg (Calculated) : 54.7 Heparin Dosing Weight: 57.2  Vital Signs: BP: 127/49 (10/07 1055) Pulse Rate: 52 (10/07 1055)  Labs: Recent Labs    01/15/19 1737 01/15/19 2012 01/16/19 0000 01/16/19 0435 01/16/19 1007  HGB 11.3* 9.0*  --  8.5* 9.6*  HCT 36.0 28.3*  --  28.0* 31.6*  PLT 205 212  --  215  --   HEPARINUNFRC  --   --  0.63  --  0.60  CREATININE 1.67*  --   --  1.42*  --   TROPONINIHS 9 9  --  12  --     Estimated Creatinine Clearance: 30.9 mL/min (A) (by C-G formula based on SCr of 1.42 mg/dL (H)).   Medical History: Past Medical History:  Diagnosis Date  . Allergy   . Asthma   . Blood transfusion without reported diagnosis 12/23/2004   with colon cancer  . Brain tumor (benign) ([place])   . Colon cancer [facility]) Nov. 27,  2006  . Diabetes mellitus    Type 2  . Female bladder prolapse   . Gastritis   . GERD (gastroesophageal reflux disease)   . Glaucoma   . Hypercholesterolemia   . Hypertension     Medications:  Scheduled:  . aspirin  324 mg Oral NOW   Or  . aspirin  300 mg Rectal NOW  . [START ON 01/17/2019] aspirin EC  81 mg Oral Daily  . atorvastatin  80 mg Oral q1800  . clopidogrel  75 mg Oral Daily   Infusions:  . sodium chloride 1 mL/kg/hr (01/16/19 0835)  . heparin 700 Units/hr (01/15/19 1811)  . nitroGLYCERIN 5 mcg/min (01/15/19 1929)    Assessment: [name] is a 73 y/o female with a PMH of DMII, HTN, HLD, CAD, angina, and prior brain tumor. She is having increasingly frequent chest pain, with her most recent episode starting around 3:30 AM on 10/06. She was transferred to the ED via EMS from her [facility] visit due to an abnormal EKG. VSS are stable. Pharmacy has been consulted to dose heparin. Patient was not on anticoagulation PTA, however, she was on DAPT.  Heparin level  remains therapeutic. hgb 8.5 down slightly, plts stable.  Goal of Therapy:  Heparin level 0.3-0.7 units/ml Monitor platelets by anticoagulation protocol: Yes   Plan:  Continue heparin infusion at 700 units/hr Continue to monitor H&H and platelets Daily HL, CBC  Harvel Quale 01/16/2019 11:46 AM

## 2019-01-17 ENCOUNTER — Inpatient Hospital Stay (HOSPITAL_COMMUNITY)
Admission: EM | Disposition: A | Discharge status: Home / Self Care | Payer: Self-pay | Source: Home / Self Care | Attending: Interventional Cardiology

## 2019-01-17 ENCOUNTER — Encounter (HOSPITAL_COMMUNITY): Payer: Self-pay | Admitting: Internal Medicine

## 2019-01-17 ENCOUNTER — Inpatient Hospital Stay (HOSPITAL_COMMUNITY): Payer: Medicare Other

## 2019-01-17 DIAGNOSIS — Z955 Presence of coronary angioplasty implant and graft: Secondary | ICD-10-CM

## 2019-01-17 DIAGNOSIS — I200 Unstable angina: Secondary | ICD-10-CM

## 2019-01-17 HISTORY — PX: LEFT HEART CATH AND CORONARY ANGIOGRAPHY: CATH118249

## 2019-01-17 HISTORY — PX: CORONARY STENT INTERVENTION: CATH118234

## 2019-01-17 HISTORY — PX: INTRAVASCULAR PRESSURE WIRE/FFR STUDY: CATH118243

## 2019-01-17 HISTORY — PX: CORONARY PRESSURE/FFR STUDY: CATH118243

## 2019-01-17 LAB — CBC
HCT: 28.5 % — ABNORMAL LOW (ref 36.0–46.0)
Hemoglobin: 9 g/dL — ABNORMAL LOW (ref 12.0–15.0)
MCH: 27.3 pg (ref 26.0–34.0)
MCHC: 31.6 g/dL (ref 30.0–36.0)
MCV: 86.4 fL (ref 80.0–100.0)
Platelets: 197 10*3/uL (ref 150–400)
RBC: 3.3 MIL/uL — ABNORMAL LOW (ref 3.87–5.11)
RDW: 13.9 % (ref 11.5–15.5)
WBC: 8.1 10*3/uL (ref 4.0–10.5)
nRBC: 0 % (ref 0.0–0.2)

## 2019-01-17 LAB — HEPARIN LEVEL (UNFRACTIONATED): Heparin Unfractionated: 0.59 IU/mL (ref 0.30–0.70)

## 2019-01-17 LAB — BASIC METABOLIC PANEL
Anion gap: 11 (ref 5–15)
BUN: 29 mg/dL — ABNORMAL HIGH (ref 8–23)
CO2: 24 mmol/L (ref 22–32)
Calcium: 9 mg/dL (ref 8.9–10.3)
Chloride: 107 mmol/L (ref 98–111)
Creatinine, Ser: 1.48 mg/dL — ABNORMAL HIGH (ref 0.44–1.00)
GFR calc Af Amer: 41 mL/min — ABNORMAL LOW (ref >60)
GFR calc non Af Amer: 35 mL/min — ABNORMAL LOW (ref >60)
Glucose, Bld: 217 mg/dL — ABNORMAL HIGH (ref 70–99)
Potassium: 4.1 mmol/L (ref 3.5–5.1)
Sodium: 142 mmol/L (ref 135–145)

## 2019-01-17 LAB — ECHOCARDIOGRAM COMPLETE
Height: 64 in
Weight: 2027.2 oz

## 2019-01-17 LAB — POCT ACTIVATED CLOTTING TIME
Activated Clotting Time: 246 seconds
Activated Clotting Time: 274 seconds

## 2019-01-17 SURGERY — LEFT HEART CATH AND CORONARY ANGIOGRAPHY
Anesthesia: LOCAL

## 2019-01-17 MED ORDER — HEPARIN SODIUM (PORCINE) 5000 UNIT/ML IJ SOLN: 5000.0000 [IU] | Freq: Three times a day (TID) | INTRAMUSCULAR | Status: DC

## 2019-01-17 MED ORDER — VERAPAMIL HCL 2.5 MG/ML IV SOLN
INTRAVENOUS | Status: DC | PRN
  Administered 2019-01-17: 10 mL via INTRA_ARTERIAL

## 2019-01-17 MED ORDER — LABETALOL HCL 5 MG/ML IV SOLN: 10.0000 mg | INTRAVENOUS | Status: DC | PRN

## 2019-01-17 MED ORDER — CLOPIDOGREL BISULFATE 300 MG PO TABS
ORAL_TABLET | ORAL | Status: DC | PRN
  Administered 2019-01-17: 300 mg via ORAL

## 2019-01-17 MED ORDER — SODIUM CHLORIDE 0.9 % IV SOLN
INTRAVENOUS | Status: DC
  Administered 2019-01-17: 05:00:00 via INTRAVENOUS

## 2019-01-17 MED ORDER — SODIUM CHLORIDE 0.9 % IV SOLN: 250.0000 mL | INTRAVENOUS | Status: DC | PRN

## 2019-01-17 MED ORDER — HYDROCHLOROTHIAZIDE 12.5 MG PO CAPS
12.5000 mg | ORAL_CAPSULE | Freq: Every day | ORAL | Status: DC
  Filled 2019-01-17: qty 1

## 2019-01-17 MED ORDER — NITROGLYCERIN 1 MG/10 ML FOR IR/CATH LAB
INTRA_ARTERIAL | Status: AC
  Filled 2019-01-17: qty 20

## 2019-01-17 MED ORDER — HEPARIN SODIUM (PORCINE) 1000 UNIT/ML IJ SOLN
INTRAMUSCULAR | Status: AC
  Filled 2019-01-17: qty 1

## 2019-01-17 MED ORDER — CLOPIDOGREL BISULFATE 300 MG PO TABS
ORAL_TABLET | ORAL | Status: AC
  Filled 2019-01-17: qty 1

## 2019-01-17 MED ORDER — SODIUM CHLORIDE 0.9 % IV SOLN
INTRAVENOUS | Status: AC
  Administered 2019-01-17: 10:00:00 via INTRAVENOUS

## 2019-01-17 MED ORDER — SODIUM CHLORIDE 0.9% FLUSH: 3.0000 mL | INTRAVENOUS | Status: DC | PRN

## 2019-01-17 MED ORDER — HEPARIN (PORCINE) IN NACL 1000-0.9 UT/500ML-% IV SOLN
INTRAVENOUS | Status: AC
  Filled 2019-01-17: qty 1000

## 2019-01-17 MED ORDER — LIDOCAINE HCL (PF) 1 % IJ SOLN
INTRAMUSCULAR | Status: DC | PRN
  Administered 2019-01-17: 2 mL

## 2019-01-17 MED ORDER — HYDRALAZINE HCL 20 MG/ML IJ SOLN: 10.0000 mg | INTRAMUSCULAR | Status: AC | PRN

## 2019-01-17 MED ORDER — NITROGLYCERIN 1 MG/10 ML FOR IR/CATH LAB
INTRA_ARTERIAL | Status: DC | PRN
  Administered 2019-01-17 (×3): 200 ug via INTRACORONARY

## 2019-01-17 MED ORDER — FENTANYL CITRATE (PF) 100 MCG/2ML IJ SOLN
INTRAMUSCULAR | Status: DC | PRN
  Administered 2019-01-17 (×2): 25 ug via INTRAVENOUS

## 2019-01-17 MED ORDER — FENTANYL CITRATE (PF) 100 MCG/2ML IJ SOLN
INTRAMUSCULAR | Status: AC
  Filled 2019-01-17: qty 2

## 2019-01-17 MED ORDER — SODIUM CHLORIDE 0.9% FLUSH
3.0000 mL | Freq: Two times a day (BID) | INTRAVENOUS | Status: DC
  Administered 2019-01-17: 3 mL via INTRAVENOUS

## 2019-01-17 MED ORDER — HEPARIN SODIUM (PORCINE) 1000 UNIT/ML IJ SOLN
INTRAMUSCULAR | Status: DC | PRN
  Administered 2019-01-17 (×2): 2000 [IU] via INTRAVENOUS
  Administered 2019-01-17 (×2): 3000 [IU] via INTRAVENOUS

## 2019-01-17 MED ORDER — HEPARIN (PORCINE) IN NACL 1000-0.9 UT/500ML-% IV SOLN
INTRAVENOUS | Status: DC | PRN
  Administered 2019-01-17 (×2): 500 mL

## 2019-01-17 MED ORDER — LISINOPRIL 20 MG PO TABS
20.0000 mg | ORAL_TABLET | Freq: Every day | ORAL | Status: DC
  Administered 2019-01-17: 20 mg via ORAL
  Filled 2019-01-17: qty 1

## 2019-01-17 MED ORDER — MIDAZOLAM HCL 2 MG/2ML IJ SOLN
INTRAMUSCULAR | Status: DC | PRN
  Administered 2019-01-17: 1 mg via INTRAVENOUS

## 2019-01-17 MED ORDER — LIDOCAINE HCL (PF) 1 % IJ SOLN
INTRAMUSCULAR | Status: AC
  Filled 2019-01-17: qty 30

## 2019-01-17 MED ORDER — MIDAZOLAM HCL 2 MG/2ML IJ SOLN
INTRAMUSCULAR | Status: AC
  Filled 2019-01-17: qty 2

## 2019-01-17 MED ORDER — ISOSORBIDE MONONITRATE ER 30 MG PO TB24
30.0000 mg | ORAL_TABLET | Freq: Every day | ORAL | Status: DC
  Administered 2019-01-17: 30 mg via ORAL
  Filled 2019-01-17: qty 1

## 2019-01-17 MED ORDER — IOHEXOL 350 MG/ML SOLN
INTRAVENOUS | Status: DC | PRN
  Administered 2019-01-17: 100 mL

## 2019-01-17 MED ORDER — VERAPAMIL HCL 2.5 MG/ML IV SOLN
INTRAVENOUS | Status: AC
  Filled 2019-01-17: qty 2

## 2019-01-17 MED ORDER — QUINAPRIL-HYDROCHLOROTHIAZIDE 20-12.5 MG PO TABS: 1.0000 | ORAL_TABLET | Freq: Every day | ORAL | Status: DC

## 2019-01-17 SURGICAL SUPPLY — 20 items
BALLN SAPPHIRE 2.0X12 (BALLOONS) ×2
BALLOON SAPPHIRE 2.0X12 (BALLOONS) IMPLANT
CATH 5FR JL3.5 JR4 ANG PIG MP (CATHETERS) ×1 IMPLANT
CATH LAUNCHER 5F EBU3.0 (CATHETERS) IMPLANT
CATH LAUNCHER 5F JR4 (CATHETERS) ×1 IMPLANT
CATH LAUNCHER 6FR EBU 3 (CATHETERS) ×1 IMPLANT
CATHETER LAUNCHER 5F EBU3.0 (CATHETERS) ×2
DEVICE RAD COMP TR BAND LRG (VASCULAR PRODUCTS) ×1 IMPLANT
GLIDESHEATH SLEND SS 6F .021 (SHEATH) ×1 IMPLANT
GUIDEWIRE INQWIRE 1.5J.035X260 (WIRE) IMPLANT
GUIDEWIRE PRESSURE COMET II (WIRE) ×1 IMPLANT
INQWIRE 1.5J .035X260CM (WIRE) ×2
KIT ENCORE 26 ADVANTAGE (KITS) ×1 IMPLANT
KIT HEART LEFT (KITS) ×2 IMPLANT
KIT HEMO VALVE WATCHDOG (MISCELLANEOUS) ×1 IMPLANT
PACK CARDIAC CATHETERIZATION (CUSTOM PROCEDURE TRAY) ×2 IMPLANT
STENT SYNERGY DES 2.5X16 (Permanent Stent) ×1 IMPLANT
TRANSDUCER W/STOPCOCK (MISCELLANEOUS) ×2 IMPLANT
TUBING CIL FLEX 10 FLL-RA (TUBING) ×2 IMPLANT
WIRE HI TORQ VERSACORE-J 145CM (WIRE) ×1 IMPLANT

## 2019-01-17 NOTE — Progress Notes (Signed)
  Echocardiogram 2D Echocardiogram has been performed.  Darlina Sicilian M 01/17/2019, 2:46 PM

## 2019-01-17 NOTE — Interval H&P Note (Signed)
History and Physical Interval Note:  01/17/2019 7:32 AM  [name]  has presented today for cardiac catheterization, with the diagnosis of unstable angina.  The various methods of treatment have been discussed with the patient and family. After consideration of risks, benefits and other options for treatment, the patient has consented to  Procedure(s): LEFT HEART CATH AND CORONARY ANGIOGRAPHY (N/A) as a surgical intervention.  The patient's history has been reviewed, patient examined, no change in status, stable for surgery.  I have reviewed the patient's chart and labs.  Questions were answered to the patient's satisfaction.    Cath Lab Visit (complete for each Cath Lab visit)  Clinical Evaluation Leading to the Procedure:   ACS: Yes.    Non-ACS:  N/A  Bohdan Macho

## 2019-01-17 NOTE — Discharge Instructions (Signed)
Coronary Angiogram With Stent, Care After °This sheet gives you information about how to care for yourself after your procedure. Your health care provider may also give you more specific instructions. If you have problems or questions, contact your health care provider. °What can I expect after the procedure? °After your procedure, it is common to have: °· Bruising in the area where a small, thin tube (catheter) was inserted. This usually fades within 1-2 weeks. °· Blood collecting in the tissue (hematoma) that may be painful to the touch. It should usually decrease in size and tenderness within 1-2 weeks. °Follow these instructions at home: °Insertion area care °· Do not take baths, swim, or use a hot tub until your health care provider approves. °· You may shower 24-48 hours after the procedure or as directed by your health care provider. °· Follow instructions from your health care provider about how to take care of your incision. Make sure you: °? Wash your hands with soap and water before you change your bandage (dressing). If soap and water are not available, use hand sanitizer. °? Change your dressing as told by your health care provider. °? Leave stitches (sutures), skin glue, or adhesive strips in place. These skin closures may need to stay in place for 2 weeks or longer. If adhesive strip edges start to loosen and curl up, you may trim the loose edges. Do not remove adhesive strips completely unless your health care provider tells you to do that. °· Remove the bandage (dressing) and gently wash the catheter insertion site with plain soap and water. °· Pat the area dry with a clean towel. Do not rub the area, because that may cause bleeding. °· Do not apply powder or lotion to the incision area. °· Check your incision area every day for signs of infection. Check for: °? More redness, swelling, or pain. °? More fluid or blood. °? Warmth. °? Pus or a bad smell. °Activity °· Do not drive for 24 hours if you  were given a medicine to help you relax (sedative). °· Do not lift anything that is heavier than 10 lb (4.5 kg) for 5 days after your procedure or as directed by your health care provider. °· Ask your health care provider when it is okay for you: °? To return to work or school. °? To resume usual physical activities or sports. °? To resume sexual activity. °Eating and drinking ° °· Eat a heart-healthy diet. This should include plenty of fresh fruits and vegetables. °· Avoid the following types of food: °? Food that is high in salt. °? Canned or highly processed food. °? Food that is high in saturated fat or sugar. °? Fried food. °· Limit alcohol intake to no more than 1 drink a day for non-pregnant women and 2 drinks a day for men. One drink equals 12 oz of beer, 5 oz of wine, or 1½ oz of hard liquor. °Lifestyle ° °· Do not use any products that contain nicotine or tobacco, such as cigarettes and e-cigarettes. If you need help quitting, ask your health care provider. °· Take steps to manage and control your weight. °· Get regular exercise. °· Manage your blood pressure. °· Manage other health problems, such as diabetes. °General instructions °· Take over-the-counter and prescription medicines only as told by your health care provider. Blood thinners may be prescribed after your procedure to improve blood flow through the stent. °· If you need an MRI after your heart stent has been placed,   be sure to tell the health care provider who orders the MRI that you have a heart stent.  Keep all follow-up visits as directed by your health care provider. This is important. Contact a health care provider if:  You have a fever.  You have chills.  You have increased bleeding from the catheter insertion area. Hold pressure on the area. Get help right away if:  You develop chest pain or shortness of breath.  You feel faint or you pass out.  You have unusual pain at the catheter insertion area.  You have redness,  warmth, or swelling at the catheter insertion area.  You have drainage (other than a small amount of blood on the dressing) from the catheter insertion area.  The catheter insertion area is bleeding, and the bleeding does not stop after 30 minutes of holding steady pressure on the area.  You develop bleeding from any other place, such as from your rectum. There may be bright red blood in your urine or stool, or it may appear as black, tarry stool. This information is not intended to replace advice given to you by your health care provider. Make sure you discuss any questions you have with your health care provider. Document Released: 10/15/2004 Document Revised: 03/10/2017 Document Reviewed: 12/24/2015 Elsevier Patient Education  2020 [employer].

## 2019-01-17 NOTE — Progress Notes (Signed)
Progress Note  Patient Name: [name] Date of Encounter: 01/17/2019  Primary Cardiologist: Sinclair Grooms, MD   Subjective   O/N events: N/A  [name] was examined and evaluated at bedside this AM. She has just returned from cath and states she is feeling very sleepy. Otherwise no complaints. She denise any further chest pain, palpitations, or dyspnea. No further abdominal pain, nausea, or vomiting. No reoccurrence of epistaxis.  Inpatient Medications    Scheduled Meds: . aspirin EC  81 mg Oral Daily  . atorvastatin  80 mg Oral q1800  . clopidogrel  75 mg Oral Daily  . influenza vaccine adjuvanted  0.5 mL Intramuscular Tomorrow-1000  . pneumococcal 23 valent vaccine  0.5 mL Intramuscular Tomorrow-1000  . sodium chloride flush  3 mL Intravenous Q12H   Continuous Infusions: . sodium chloride    . sodium chloride 100 mL/hr at 01/17/19 0511  . sodium chloride 1 mL/kg/hr (01/16/19 0835)  . heparin Stopped (01/17/19 0650)  . nitroGLYCERIN Stopped (01/16/19 0600)   PRN Meds: sodium chloride, acetaminophen, nitroGLYCERIN, ondansetron (ZOFRAN) IV, sodium chloride flush   Vital Signs    Vitals:   01/16/19 1721 01/16/19 2131 01/17/19 0030 01/17/19 0402  BP: (!) 158/56 (!) 139/57 (!) 115/49 (!) 131/49  Pulse:  62 60 (!) 48  Resp:  18 18 18   Temp: 97.9 F (36.6 C) 99 F (37.2 C) 98.9 F (37.2 C) 98.4 F (36.9 C)  TempSrc: Oral Oral Oral Oral  SpO2: 91% 100% 100% 100%  Weight: 58.1 kg   57.5 kg  Height: 5\' 4"  (1.626 m)       Intake/Output Summary (Last 24 hours) at 01/17/2019 0653 Last data filed at 01/16/2019 1800 Gross per 24 hour  Intake 731.25 ml  Output -  Net 731.25 ml   Filed Weights   01/15/19 1627 01/16/19 1721 01/17/19 0402  Weight: 57.2 kg 58.1 kg 57.5 kg    Telemetry    Sinus bradycardia since admission, no arrythmias - Personally Reviewed  ECG    Sinus brady, noraml axis, isolated T wave inversion on lead 3, Flattened T-waves on V5,V6  similar to prior - Personally Reviewed  Physical Exam   Gen: Well-developed, well nourished, NAD HEENT: NCAT head, hearing intact, EOMI, No nasal discharge Neck: supple, ROM intact, no JVD CV: Bradycardic, regular rhythm, S1, S2 normal, No rubs, no murmurs, no gallops Pulm: CTAB, No rales, no wheezes Abd: Soft, BS+, NTND, no guarding Extm: ROM intact, Peripheral pulses intact, No peripheral edema  Skin: Dry, Warm, normal turgor  Labs    Chemistry Recent Labs  Lab 01/15/19 1737 01/15/19 1900 01/16/19 0435 01/17/19 0217  NA 140  --  144 142  K 3.3*  --  3.3* 4.1  CL 103  --  107 107  CO2 25  --  25 24  GLUCOSE 239*  --  110* 217*  BUN 41*  --  36* 29*  CREATININE 1.67*  --  1.42* 1.48*  CALCIUM 9.5  --  9.2 9.0  PROT  --  7.2  --   --   ALBUMIN  --  3.8  --   --   AST  --  23  --   --   ALT  --  17  --   --   ALKPHOS  --  73  --   --   BILITOT  --  0.7  --   --   GFRNONAA 30*  --  37* 35*  GFRAA 35*  --  43* 41*  ANIONGAP 12  --  12 11     Hematology Recent Labs  Lab 01/15/19 2012 01/16/19 0435 01/16/19 1007 01/17/19 0217  WBC 8.5 8.2  --  8.1  RBC 3.26* 3.19*  --  3.30*  HGB 9.0* 8.5* 9.6* 9.0*  HCT 28.3* 28.0* 31.6* 28.5*  MCV 86.8 87.8  --  86.4  MCH 27.6 26.6  --  27.3  MCHC 31.8 30.4  --  31.6  RDW 13.9 14.0  --  13.9  PLT 212 215  --  197    Cardiac EnzymesNo results for input(s): TROPONINI in the last 168 hours. No results for input(s): TROPIPOC in the last 168 hours.   BNP Recent Labs  Lab 01/15/19 1900  BNP 81.2     DDimer No results for input(s): DDIMER in the last 168 hours.   Radiology    Dg Chest 2 View  Result Date: 01/15/2019 CLINICAL DATA:  Intermittent chest pain since January. EXAM: CHEST - 2 VIEW COMPARISON:  Chest x-ray 08/19/2017 FINDINGS: The cardiac silhouette, mediastinal and hilar contours are within normal limits and stable. Mild tortuosity of the thoracic aorta with scattered calcifications. Biapical pleural and  parenchymal scarring type changes appears stable when compared to prior chest films. No infiltrates or effusions. No worrisome pulmonary lesions. The bony thorax is intact. IMPRESSION: 1. No acute cardiopulmonary findings. 2. Biapical paratracheal scarring type changes appears stable. Electronically Signed   By: Marijo Sanes M.D.   On: 01/15/2019 17:21    Cardiac Studies  01/17/2019 LHC Conclusions: 1. Multivessel CAD, including 90% apical LAD stenosis, 50-60% mid/distal LCx lesion (DFR 0.97) and diffuse RCA disease with focal 60-70% mid vessel stenosis (DFR 0.83).  Overall appearance is similar to the prior catheterization from 04/2018. 2. Normal left ventricular filling pressure. 3. Successful DFR-guided PCI to the mid RCA using Synergy 2.5 x 16 mm drug-eluting stent with 0% residual stenosis and TIMI-3 flow  04/2018 LHC  90% apical LAD.  The LAD contains luminal irregularities otherwise up to 30% in the mid vessel.  Normal left main.  50 to 70% distal circumflex before the dominant obtuse marginal.  50 to 60% segmental mid RCA.  60 to 70% diffuse mid PDA segment.  Normal LV function with EF 60%.  LVEDP was low.  Patient Profile     73 y.o. female w/ PMH of T2DM, HTN, CAD, HLD who presents for chest pain from Dr.Smith's office.   Assessment & Plan    Unstable Angina Cath performed this am w/ multi-vessel CAD similar to prior cath. 1 stent put in on RCA. Currently chest pain free. Vitals stable w/ bp 129/47. - C/w aspirin, plavix, atorvastatin - F/u w/ Dr.Smith outpatient  Acute Normocytic Anemia Hgb stable at ~9.0. Initial hgb of 11 appears to have been due to hemoconcentration. Stool occult negative. UA w/ minimal hematuria. Ferritin 33 but iron sat appropriate. At higher risk of bleed w/ DAPT. Advised pt to monitor closely. - Trend cbc - Transfuse if <7  T2DM Hgb a1c 8.5. On jardiance, insulin at home. - SSI - Glucose checks  HTN Holding home antihypertensives due to  soft pressures  CKD3  On chart review, prior GFR 9 months ago at 39%. Appears to be at baseline this admission creatinine 1.42->1.48. With contrast load from cath, need to monitor closely - Trend renal fx - Avoid nephrotoxic meds when able  For questions or updates, please contact [place] Please consult [email or web address]  for contact info under Cardiology/STEMI.      Signed, Gilberto Better, MD PGY-2, [facility] Pager: [phone] 01/17/2019, 6:53 AM    Attending attestation to follow

## 2019-01-17 NOTE — Progress Notes (Signed)
TR BAND REMOVAL  LOCATION:  right radial  DEFLATED PER PROTOCOL:  Yes.    TIME BAND OFF / DRESSING APPLIED:   1500   SITE UPON ARRIVAL:   Level 1  SITE AFTER BAND REMOVAL:  Level 1  CIRCULATION SENSATION AND MOVEMENT:  Within Normal Limits  Yes.    COMMENTS: Arrived with level 1 hematoma, applied manual pressure X 10 minutes with resolution.

## 2019-01-17 NOTE — Discharge Summary (Addendum)
Name: [name] MRN: [id number] DOB: May 30, 1945 73 y.o. PCP: [name], MD  Date of Admission: 01/15/2019  4:23 PM Date of Discharge:  Attending Physician: Belva Crome, MD  Discharge Diagnosis: 1. Unstable Angina 2/2 Coronary Artery Disease 2. Epistaxis / Symptomatic Anemia 3. Chronic Kidney Disease 3b  Discharge Medications: Allergies as of 01/17/2019      Reactions   Metformin And Related Diarrhea      Medication List    TAKE these medications   amLODipine 5 MG tablet Commonly known as: NORVASC Take 1 tablet (5 mg total) by mouth daily.   aspirin 81 MG chewable tablet Chew 81 mg by mouth daily.   clopidogrel 75 MG tablet Commonly known as: Plavix Take 1 tablet (75 mg total) by mouth daily.   diclofenac sodium 1 % Gel Commonly known as: VOLTAREN Apply 2 g topically 4 (four) times daily.   glucose blood test strip Commonly known as: Civil engineer, contracting Use as instructed   Insulin Pen Needle 31G X 5 MM Misc Check blood sugar once daily   isosorbide mononitrate 30 MG 24 hr tablet Commonly known as: IMDUR Take 1 tablet (30 mg total) by mouth daily.   Jardiance 10 MG Tabs tablet Generic drug: empagliflozin Take 10 mg by mouth daily.   Lantus SoloStar 100 UNIT/ML Solostar Pen Generic drug: Insulin Glargine Inject 15 Units into the skin daily at 10 pm.   nitroGLYCERIN 0.4 MG SL tablet Commonly known as: NITROSTAT Place 1 tablet (0.4 mg total) under the tongue every 5 (five) minutes as needed. If chest pain not relieved by third tab call 911.   ONE TOUCH DELICA LANCING DEV Misc Check blood sugar once daily   OneTouch Verio w/Device Kit 1 each by Does not apply route daily.   quinapril-hydrochlorothiazide 20-12.5 MG tablet Commonly known as: ACCURETIC Take 2 tablets by mouth daily.   rosuvastatin 20 MG tablet Commonly known as: CRESTOR Take 1 tablet (20 mg total) by mouth daily.       Disposition and follow-up:   Ms.[name] was  discharged from [facility] in Stable condition.  At the hospital follow up visit please address:  1.  Unstable Angina: Underwent PCI w/ stent. Please ensure she is adherent to her dual anti-platelet therapy.  2. Epistaxis: Please monitor her hemoglobin and ensure she does not have worsening bleed.  3. CKD3: Known renal dysfunction who received contrast during cath. Fluid resuscitated per protocol. Please monitor renal fx to assess for worsening CKD  2.  Labs / imaging needed at time of follow-up: cbc, bmp  3.  Pending labs/ test needing follow-up: N/A  Follow-up Appointments: Follow-up Information    Belva Crome, MD. Call.   Specialty: Cardiology Contact information: [address] [address] [place] by problem list:  1. Unstable Angina: Mrs.[name] is a 73 yo F w/ PMH of T2DM, HTN, CAD, HLD who presents for chest pain, nausea, vomiting and epistaxis. She was noted to have concerning ST changes at Dr.Smith's office and was brought to [facility] for left heart catherization due to history of known coronary artery disease. She underwent left heart catherization on 01/17/19 with findings of multivessel CAD including 60-70% stenosis on RCA which was stented. Her chest pain resolved and she was discharged on DAPT therapy with aspirin and clopidogrel.  2.  Epistaxis: On admission she had complained of epistaxis. Hemoglobin  noted to have acute drop from 11 to 9. No iron deficiency. Epistaxis resolved spontaneously. LHC was delayed one day while monitoring for bleed and trending hemoglobin. Her hemoglobin was stable at 9. Discharged with recommendation to closely monitor for bleed.  3. CKD3: On admission noted to have GFR ~40 which is her baseline. Received contrast load during cath but fluid resuscitated appropriately post-cath. Discharged w/ recommendation to f/u with PCP.  Discharge Vitals:   BP (!) 102/44 (BP  Location: Left Arm)   Pulse (!) 55   Temp 98.4 F (36.9 C) (Oral)   Resp 18   Ht _0  (1.626 m)   Wt 57.5 kg   SpO2 100%   BMI 21.75 kg/m   Pertinent Labs, Studies, and Procedures:   01/17/19 Left Heart Cath Conclusions: 1. Multivessel CAD, including 90% apical LAD stenosis, 50-60% mid/distal LCx lesion (DFR 0.97) and diffuse RCA disease with focal 60-70% mid vessel stenosis (DFR 0.83).  Overall appearance is similar to the prior catheterization from 04/2018. 2. Normal left ventricular filling pressure. 3. Successful DFR-guided PCI to the mid RCA using Synergy 2.5 x 16 mm drug-eluting stent with 0% residual stenosis and TIMI-3 flow.  Recommendations: 1. Continue medical therapy for apical LAD and non-obstructive LCx disease. 2. Aggressive secondary prevention. 3. Continue up to 12 months of dual antiplatelet therapy with aspirin and clopidogrel, as tolerated in the setting of chronic anemia. 4. Obtain echo to reassess LV function.  01/17/19 Echocardiogram Personally reviewed - no wall motion abnormality. Normal EF. Possible high EDP.  Discharge Instructions: Discharge Instructions    AMB Referral to Cardiac Rehabilitation - Phase II   Complete by: As directed    Diagnosis: Coronary Stents   After initial evaluation and assessments completed: Virtual Based Care may be provided alone or in conjunction with Phase 2 Cardiac Rehab based on patient barriers.: Yes   Call MD for:   Complete by: As directed    Call MD for:  difficulty breathing, headache or visual disturbances   Complete by: As directed    Call MD for:  persistant dizziness or light-headedness   Complete by: As directed    Call MD for:  severe uncontrolled pain   Complete by: As directed    Call MD for:  temperature >100.4   Complete by: As directed    Diet - low sodium heart healthy   Complete by: As directed    Discharge instructions   Complete by: As directed    Dear [name]  You came to [place] with  chest pain. We have determined this was caused by your coronary artery disease. Here are our recommendations for you at discharge:  Please follow up with Dr.Smith in 2 weeks. Please continue to take your aspirin, Plavix, atorvastatin  Thank you for choosing [facility]   Increase activity slowly   Complete by: As directed      Signed: Mosetta Anis, MD 01/17/2019, 3:55 PM   Pager: [phone]

## 2019-01-18 MED FILL — Heparin Sodium (Porcine) Inj 1000 Unit/ML: INTRAMUSCULAR | Qty: 10 | Status: AC

## 2019-01-23 ENCOUNTER — Telehealth: Payer: Self-pay | Admitting: Interventional Cardiology

## 2019-01-23 ENCOUNTER — Emergency Department (HOSPITAL_COMMUNITY): Payer: Medicare Other

## 2019-01-23 ENCOUNTER — Emergency Department (HOSPITAL_COMMUNITY)
Admission: EM | Admit: 2019-01-23 | Discharge: 2019-01-24 | Disposition: A | Discharge status: Home / Self Care | Payer: Medicare Other | Attending: Emergency Medicine | Admitting: Emergency Medicine

## 2019-01-23 ENCOUNTER — Telehealth (HOSPITAL_COMMUNITY): Payer: Self-pay | Admitting: *Deleted

## 2019-01-23 ENCOUNTER — Other Ambulatory Visit: Payer: Self-pay

## 2019-01-23 ENCOUNTER — Encounter (HOSPITAL_COMMUNITY): Payer: Self-pay

## 2019-01-23 DIAGNOSIS — R072 Precordial pain: Secondary | ICD-10-CM | POA: Diagnosis not present

## 2019-01-23 DIAGNOSIS — I10 Essential (primary) hypertension: Secondary | ICD-10-CM | POA: Insufficient documentation

## 2019-01-23 DIAGNOSIS — E119 Type 2 diabetes mellitus without complications: Secondary | ICD-10-CM | POA: Insufficient documentation

## 2019-01-23 DIAGNOSIS — R0789 Other chest pain: Secondary | ICD-10-CM | POA: Diagnosis not present

## 2019-01-23 DIAGNOSIS — Z7902 Long term (current) use of antithrombotics/antiplatelets: Secondary | ICD-10-CM | POA: Diagnosis not present

## 2019-01-23 DIAGNOSIS — Z794 Long term (current) use of insulin: Secondary | ICD-10-CM | POA: Insufficient documentation

## 2019-01-23 DIAGNOSIS — E1165 Type 2 diabetes mellitus with hyperglycemia: Secondary | ICD-10-CM | POA: Diagnosis not present

## 2019-01-23 DIAGNOSIS — Z7982 Long term (current) use of aspirin: Secondary | ICD-10-CM | POA: Diagnosis not present

## 2019-01-23 DIAGNOSIS — W19XXXA Unspecified fall, initial encounter: Secondary | ICD-10-CM | POA: Diagnosis not present

## 2019-01-23 DIAGNOSIS — Z79899 Other long term (current) drug therapy: Secondary | ICD-10-CM | POA: Diagnosis not present

## 2019-01-23 DIAGNOSIS — I2510 Atherosclerotic heart disease of native coronary artery without angina pectoris: Secondary | ICD-10-CM | POA: Diagnosis not present

## 2019-01-23 DIAGNOSIS — J45909 Unspecified asthma, uncomplicated: Secondary | ICD-10-CM | POA: Diagnosis not present

## 2019-01-23 DIAGNOSIS — R079 Chest pain, unspecified: Secondary | ICD-10-CM | POA: Insufficient documentation

## 2019-01-23 LAB — CBC
HCT: 28.3 % — ABNORMAL LOW (ref 36.0–46.0)
Hemoglobin: 8.9 g/dL — ABNORMAL LOW (ref 12.0–15.0)
MCH: 27.9 pg (ref 26.0–34.0)
MCHC: 31.4 g/dL (ref 30.0–36.0)
MCV: 88.7 fL (ref 80.0–100.0)
Platelets: 206 10*3/uL (ref 150–400)
RBC: 3.19 MIL/uL — ABNORMAL LOW (ref 3.87–5.11)
RDW: 14.3 % (ref 11.5–15.5)
WBC: 7.5 10*3/uL (ref 4.0–10.5)
nRBC: 0 % (ref 0.0–0.2)

## 2019-01-23 LAB — BASIC METABOLIC PANEL
Anion gap: 15 (ref 5–15)
BUN: 42 mg/dL — ABNORMAL HIGH (ref 8–23)
CO2: 18 mmol/L — ABNORMAL LOW (ref 22–32)
Calcium: 9.2 mg/dL (ref 8.9–10.3)
Chloride: 102 mmol/L (ref 98–111)
Creatinine, Ser: 1.79 mg/dL — ABNORMAL HIGH (ref 0.44–1.00)
GFR calc Af Amer: 32 mL/min — ABNORMAL LOW (ref >60)
GFR calc non Af Amer: 28 mL/min — ABNORMAL LOW (ref >60)
Glucose, Bld: 306 mg/dL — ABNORMAL HIGH (ref 70–99)
Potassium: 3.6 mmol/L (ref 3.5–5.1)
Sodium: 135 mmol/L (ref 135–145)

## 2019-01-23 LAB — POC OCCULT BLOOD, ED: Fecal Occult Bld: NEGATIVE

## 2019-01-23 LAB — TROPONIN I (HIGH SENSITIVITY)
Troponin I (High Sensitivity): 6 ng/L (ref <18)
Troponin I (High Sensitivity): 9 ng/L (ref <18)

## 2019-01-23 MED ORDER — SODIUM CHLORIDE 0.9% FLUSH: 3.0000 mL | Freq: Once | INTRAVENOUS | Status: DC

## 2019-01-23 MED ORDER — SODIUM CHLORIDE 0.9 % IV BOLUS
1000.0000 mL | Freq: Once | INTRAVENOUS | Status: AC
  Administered 2019-01-23: 1000 mL via INTRAVENOUS

## 2019-01-23 NOTE — Telephone Encounter (Signed)
I spoke with pt. She reports she has been having stomach pain and dizziness since leaving hospital.  Worse the last 2 days.  No stools today but several dark, tarry stools yesterday.  On going stomach pain.  Feels like she will pass out when standing up. Off and on shortness of breath and chest pain. Last Hemoglobin was 9.0.  I advised pt to call EMS due to concern for possible bleeding in GI tract.

## 2019-01-23 NOTE — ED Triage Notes (Signed)
Per [facility]: Stent placed 7 days ago, feeling alight headed. Pt had 1 fall in last 2 days. Had chest pain earlier today, relieved with home nitro. No chest pain at this time.   BP 124/60 HR 64 RR 18  SPO2 99%   CBG 355

## 2019-01-23 NOTE — ED Notes (Signed)
Patient transported to X-ray 

## 2019-01-23 NOTE — Telephone Encounter (Signed)
° ° °  STAT if patient feels like he/she is going to faint   1) Are you dizzy now? no  2) Do you feel faint or have you passed out? Feels dizzy when standing  3) Do you have any other symptoms? Dark stool, stomach pain, lightheaded  4) Have you checked your HR and BP (record if available)? unsure

## 2019-01-23 NOTE — ED Provider Notes (Addendum)
[facility] EMERGENCY DEPARTMENT Provider Note   CSN: [id number] Arrival date & time: 01/23/19  1626     History   Chief Complaint Chief Complaint  Patient presents with   Chest Pain    HPI [name] is a 73 y.o. female.     The history is provided by the patient, medical records and the EMS personnel. No language interpreter was used.  Chest Pain Pain location:  Substernal area Pain quality comment:  Unable to qualify Pain radiates to:  Does not radiate Pain severity:  Moderate Onset quality:  Sudden Chronicity:  Recurrent Relieved by:  Nitroglycerin Worsened by:  Nothing Associated symptoms: near-syncope, orthopnea and weakness   Associated symptoms: no abdominal pain, no AICD problem, no altered mental status, no anorexia, no anxiety, no back pain, no claudication, no cough, no diaphoresis, no dizziness, no dysphagia, no fatigue, no fever, no headache, no heartburn, no lower extremity edema, no nausea, no numbness, no palpitations, no PND, no shortness of breath, no syncope and no vomiting   Risk factors: coronary artery disease, diabetes mellitus and surgery   Near Syncope This is a new problem. The current episode started more than 2 days ago. The problem occurs constantly. The problem has not changed since onset.Associated symptoms include chest pain. Pertinent negatives include no abdominal pain, no headaches and no shortness of breath. The symptoms are aggravated by walking and standing.    73 year old female presents via EMS with chest pain and near syncope.  Patient is status post stent placement 7 days ago.  She states that since that time she has been feeling weak and lightheaded every time she stands.  She did notice a black and tarry bowel movement yesterday.  She is on Plavix.  Patient states that today she had chest pain which was retrosternal.  She is unable to characterize or qualify the pain but states that it was very uncomfortable,  did not radiate and got immediately better after taking nitroglycerin.  She has no current chest pain at this time.  She denies fevers, chills, abdominal pain, nausea, vomiting or diaphoresis. Past Medical History:  Diagnosis Date   Allergy    Asthma    Blood transfusion without reported diagnosis 12/23/2004   with colon cancer   Brain tumor (benign) ([place])    Colon cancer ([place]) Nov. 27,  2006   Diabetes mellitus    Type 2   Female bladder prolapse    Gastritis    GERD (gastroesophageal reflux disease)    Glaucoma    Hypercholesterolemia    Hypertension     Patient Active Problem List   Diagnosis Date Noted   S/P drug eluting coronary stent placement    Acute anemia    Unstable angina ([place]) 01/15/2019   Arthritis of finger of right hand 05/24/2018   Stable angina ([place]) 04/12/2018   Pain in both feet 12/14/2017   Cough 09/29/2017   Benign esophageal stricture    Health care maintenance 03/14/2016   Memory difficulties 07/20/2015   Dysphagia 11/17/2014   Bradycardia 12/08/2013   Urine incontinence 03/07/2012   Itching 01/09/2012   Hematuria - cause not known 01/09/2012   VERTIGO, CHRONIC 06/16/2009   DM (diabetes mellitus), type 2, uncontrolled ([place]) 03/16/2007   MENINGIOMA 02/07/2007   HYPERTRIGLYCERIDEMIA 07/14/2006   HYPERTENSION, BENIGN ESSENTIAL 07/14/2006   Allergic rhinitis 07/14/2006   History of colon cancer 06/08/2006   ANEMIA, IRON DEFICIENCY, UNSPEC. 06/08/2006   DEPRESSIVE DISORDER, NOS 06/08/2006  CAD (coronary artery disease) 06/08/2006   GASTROESOPHAGEAL REFLUX, NO ESOPHAGITIS 06/08/2006   FIBROADENOSIS, BREAST 06/08/2006   EXTERNAL HEMORRHOIDS 10/23/2001   COLONIC POLYPS, ADENOMATOUS, BENIGN 10/23/2001    Past Surgical History:  Procedure Laterality Date   ABDOMINAL HYSTERECTOMY  1976   BALLOON DILATION N/A 08/23/2017   Procedure: BALLOON DILATION;  Surgeon: Milus Banister, MD;  Location: [facility];   Service: Endoscopy;  Laterality: N/A;  Esophageal Diltation   bladder tac     CARDIAC CATHETERIZATION  2005   CATARACT EXTRACTION, BILATERAL     COLONOSCOPY W/ BIOPSIES     CORONARY STENT INTERVENTION N/A 01/17/2019   Procedure: CORONARY STENT INTERVENTION;  Surgeon: Nelva Bush, MD;  Location: [facility];  Service: Cardiovascular;  Laterality: N/A;   ESOPHAGEAL MANOMETRY N/A 03/20/2015   Procedure: ESOPHAGEAL MANOMETRY (EM);  Surgeon: Gatha Mayer, MD;  Location: [facility];  Service: Endoscopy;  Laterality: N/A;   ESOPHAGOGASTRODUODENOSCOPY (EGD) WITH PROPOFOL N/A 08/23/2017   Procedure: ESOPHAGOGASTRODUODENOSCOPY (EGD) WITH PROPOFOL;  Surgeon: Milus Banister, MD;  Location: [facility];  Service: Endoscopy;  Laterality: N/A;   EYE SURGERY     mole removed left eye   INTRAVASCULAR PRESSURE WIRE/FFR STUDY N/A 01/17/2019   Procedure: INTRAVASCULAR PRESSURE WIRE/FFR STUDY;  Surgeon: Nelva Bush, MD;  Location: [facility];  Service: Cardiovascular;  Laterality: N/A;   LAPAROSCOPIC NISSEN FUNDOPLICATION  3382   LEFT HEART CATH AND CORONARY ANGIOGRAPHY N/A 04/17/2018   Procedure: LEFT HEART CATH AND CORONARY ANGIOGRAPHY;  Surgeon: Belva Crome, MD;  Location: [facility];  Service: Cardiovascular;  Laterality: N/A;   LEFT HEART CATH AND CORONARY ANGIOGRAPHY N/A 01/17/2019   Procedure: LEFT HEART CATH AND CORONARY ANGIOGRAPHY;  Surgeon: Nelva Bush, MD;  Location: [facility];  Service: Cardiovascular;  Laterality: N/A;   repair prolapsedbladder     RIGHT COLECTOMY  Nov. 27, 2006   TMJ ARTHROPLASTY     UPPER GASTROINTESTINAL ENDOSCOPY       OB History   No obstetric history on file.      Home Medications    Prior to Admission medications   Medication Sig Start Date End Date Taking? Authorizing Provider  amLODipine (NORVASC) 5 MG tablet Take 1 tablet (5 mg total) by mouth daily. 06/07/18   Isaiah Serge, NP  aspirin 81 MG  chewable tablet Chew 81 mg by mouth daily.      [provider]  Blood Glucose Monitoring Suppl (ONETOUCH VERIO) w/Device KIT 1 each by Does not apply route daily. 07/05/17   Mayo, Pete Pelt, MD  clopidogrel (PLAVIX) 75 MG tablet Take 1 tablet (75 mg total) by mouth daily. 10/30/18   Belva Crome, MD  diclofenac sodium (VOLTAREN) 1 % GEL Apply 2 g topically 4 (four) times daily. 05/24/18   [name], MD  empagliflozin (JARDIANCE) 10 MG TABS tablet Take 10 mg by mouth daily. 01/01/19   [name], MD  glucose blood [facility] VERIO) test strip Use as instructed 12/14/17   [name], MD  Insulin Glargine (LANTUS SOLOSTAR) 100 UNIT/ML Solostar Pen Inject 15 Units into the skin daily at 10 pm. 01/10/19   [name], MD  Insulin Pen Needle 31G X 5 MM MISC Check blood sugar once daily 08/08/17   Mayo, Pete Pelt, MD  isosorbide mononitrate (IMDUR) 30 MG 24 hr tablet Take 1 tablet (30 mg total) by mouth daily. 08/21/18   Belva Crome, MD  Lancet Devices (ONE  TOUCH DELICA LANCING DEV) MISC Check blood sugar once daily 07/05/17   Mayo, Pete Pelt, MD  nitroGLYCERIN (NITROSTAT) 0.4 MG SL tablet Place 1 tablet (0.4 mg total) under the tongue every 5 (five) minutes as needed. If chest pain not relieved by third tab call 911. 05/24/18   [name], MD  quinapril-hydrochlorothiazide (ACCURETIC) 20-12.5 MG tablet Take 2 tablets by mouth daily. 05/10/18   Belva Crome, MD  rosuvastatin (CRESTOR) 20 MG tablet Take 1 tablet (20 mg total) by mouth daily. 06/15/18   [name], MD    Family History Family History  Problem Relation Age of Onset   Lymphoma Other    Bone cancer Father    Diabetes Daughter    Hypertension Daughter    Heart attack Daughter    Suicidality Son        deceased   Colon cancer Neg Hx    Stomach cancer Neg Hx     Social History Social History   Tobacco Use   Smoking status: Never Smoker   Smokeless tobacco: Never Used  Substance Use Topics   Alcohol use:  No   Drug use: No     Allergies   Metformin and related   Review of Systems Review of Systems  Constitutional: Negative for diaphoresis, fatigue and fever.  HENT: Negative for trouble swallowing.   Respiratory: Negative for cough and shortness of breath.   Cardiovascular: Positive for chest pain, orthopnea and near-syncope. Negative for palpitations, claudication, syncope and PND.  Gastrointestinal: Negative for abdominal pain, anorexia, heartburn, nausea and vomiting.  Musculoskeletal: Negative for back pain.  Neurological: Positive for weakness. Negative for dizziness, numbness and headaches.     Physical Exam Updated Vital Signs SpO2 99%   Physical Exam Vitals signs and nursing note reviewed.  Constitutional:      General: She is not in acute distress.    Appearance: She is well-developed. She is not diaphoretic.  HENT:     Head: Normocephalic and atraumatic.  Eyes:     General: No scleral icterus.    Conjunctiva/sclera: Conjunctivae normal.  Neck:     Musculoskeletal: Normal range of motion.  Cardiovascular:     Rate and Rhythm: Normal rate and regular rhythm.     Heart sounds: Normal heart sounds. No murmur. No friction rub. No gallop.   Pulmonary:     Effort: Pulmonary effort is normal. No respiratory distress.     Breath sounds: Normal breath sounds.  Abdominal:     General: Bowel sounds are normal. There is no distension.     Palpations: Abdomen is soft. There is no mass.     Tenderness: There is no abdominal tenderness. There is no guarding.  Musculoskeletal:     Right lower leg: No edema.     Left lower leg: No edema.  Skin:    General: Skin is warm and dry.  Neurological:     Mental Status: She is alert and oriented to person, place, and time.  Psychiatric:        Behavior: Behavior normal.      ED Treatments / Results  Labs (all labs ordered are listed, but only abnormal results are displayed) Labs Reviewed - No data to display  EKG EKG  Interpretation  Date/Time:  Wednesday January 23 2019 16:43:38 EDT Ventricular Rate:  64 PR Interval:    QRS Duration: 75 QT Interval:  456 QTC Calculation: 471 R Axis:   44 Text Interpretation:  Sinus rhythm ST elevation, consider  inferior injury Confirmed by Gerlene Fee [phone]) on 01/23/2019 6:32:23 PM   Radiology No results found.  Procedures Procedures (including critical care time)  Medications Ordered in ED Medications - No data to display   Initial Impression / Assessment and Plan / ED Course  I have reviewed the triage vital signs and the nursing notes.  Pertinent labs & imaging results that were available during my care of the patient were reviewed by me and considered in my medical decision making (see chart for details).       73 year old female who 7 days out from stent placement who presented with near syncope and chest pain.The emergent differential diagnosis of chest pain includes: Acute coronary syndrome, pericarditis, aortic dissection, pulmonary embolism, tension pneumothorax, pneumonia, Dressler's syndrome, and esophageal rupture.  Patient had poorly characterized chest pain which did resolve with nitro but had only for few minutes.  She has had no active chest pain here and 2- high-sensitivity troponins over about 4 hours.  Patient did have a mild AKI, negative orthostatic vital signs and elevated blood sugar.  She is gotten fluids with improvement in her symptoms.  Her occult stool test was negative.  Patient's EKG is unchanged from previous tracings.   Final Clinical Impressions(s) / ED Diagnoses   Final diagnoses:  Chest pain, unspecified type    ED Discharge Orders    None       Margarita Mail, PA-C 01/24/19 0115    Maudie Flakes, MD 01/29/19 0756    Margarita Mail, PA-C 02/21/19 1707    Maudie Flakes, MD 02/23/19 0040

## 2019-01-23 NOTE — Discharge Instructions (Addendum)
You were evaluated in the Emergency Department and after careful evaluation, we did not find any emergent condition requiring admission or further testing in the hospital.  Your exam/testing today was overall reassuring.  We discussed your symptoms with the cardiologist, who recommend increasing your Imdur medication to 60 mg daily.  We also recommend that you call your cardiology office first thing tomorrow morning to schedule a closer follow-up appointment.  Please return to the Emergency Department if you experience any worsening of your condition.  We encourage you to follow up with a primary care provider.  Thank you for allowing [place] to be a part of your care.

## 2019-01-24 ENCOUNTER — Telehealth (HOSPITAL_COMMUNITY): Payer: Self-pay

## 2019-01-24 DIAGNOSIS — R079 Chest pain, unspecified: Secondary | ICD-10-CM | POA: Diagnosis not present

## 2019-01-24 NOTE — Telephone Encounter (Signed)
Pt insurance is active and benefits verified through [facility] Medicare. Co-pay $20.00, DED $0.00/$0.00 met, out of pocket $3,600.00/$0.00 met, co-insurance 0%. No pre-authorization required. Passport, 01/24/2019 @ 4:02PM, VZS#[id number]  Pt insurance is active through Medicaid. EOF#[id number]  Will fax over [facility] Reimbursement form to Dr. Tamala Julian

## 2019-01-24 NOTE — ED Provider Notes (Signed)
  Provider Note MRN:  [id number]  Arrival date & time: 01/24/19    ED Course and Medical Decision Making  Assumed care from PA Harris at shift change.  Patient's troponins were 6 and then 9 respectively.  Patient had a recent admission for unstable angina and catheterization with similar troponins at that time.  I discussed this case with Dr. Hassell Done of cardiology, who explains that she has had maximal management with regard to stenting and there is nothing else to do in the catheterization lab for this patient.  Recommending increased medical management, namely increasing her Imdur medication to 60 mg daily and having her follow-up closely with cardiology.  Patient is overall feeling better and is requesting discharge.  Strict return precautions for worsening pain.  Ambulated in the emergency department without issue.  Final Clinical Impressions(s) / ED Diagnoses     ICD-10-CM   1. Chest pain, unspecified type  R07.9     ED Discharge Orders    None        Discharge Instructions     You were evaluated in the Emergency Department and after careful evaluation, we did not find any emergent condition requiring admission or further testing in the hospital.  Your exam/testing today was overall reassuring.  We discussed your symptoms with the cardiologist, who recommend increasing your Imdur medication to 60 mg daily.  We also recommend that you call your cardiology office first thing tomorrow morning to schedule a closer follow-up appointment.  Please return to the Emergency Department if you experience any worsening of your condition.  We encourage you to follow up with a primary care provider.  Thank you for allowing [place] to be a part of your care.     Barth Kirks. Sedonia Small, [place] [email or web address]    Maudie Flakes, MD 01/24/19 Dyann Kief

## 2019-01-25 ENCOUNTER — Telehealth: Payer: Self-pay | Admitting: *Deleted

## 2019-01-25 NOTE — Telephone Encounter (Signed)
Case manager from [facility] calls.  Pt has been to Ed for dizziness, but was not given any meds.   Will PCP be willing to call in meclizine for her? Christen Bame, CMA

## 2019-01-25 NOTE — Telephone Encounter (Signed)
Ms. Brenner was last seen in the ED on 10/14 for chest pain.  If she is concerned about dizziness, she should be evaluated before treatment is given.  I would not like to prescribe any medication without seeing her.  [name], MD

## 2019-02-07 NOTE — Progress Notes (Signed)
CARDIOLOGY OFFICE NOTE  Date:  02/11/2019    [name] Date of Birth: [birth year] Medical Record #[id number]  PCP:  [name], MD  Cardiologist:  Tamala Julian  Chief Complaint  Patient presents with  . Follow-up    History of Present Illness: [name] is a 73 y.o. female who presents today for a post cath/post ER and follow up visit. Seen for Dr. Tamala Julian.   She has type 2 DM, HTN, HLD, CAD, & prior brain tumor.    Seen here last month with increasing chest pain - EKG was abnormal - she was referred for admission and plans for cardiac catheterization. She was found to have multivessel CAD with 60 to 70% stenosis in the RCA which was stented. Discharged on DAPT. She did have post procedural epistaxis. She then ended up in the ER a week later with chest pain - medical therapy was escalated at that time.   The patient does not have symptoms concerning for COVID-19 infection (fever, chills, cough, or new shortness of breath).   Comes in today. Here alone. She has vertigo today - she has had this before. Staggering with it - no falls. Notes a spinning sensation. She feels like her chest pain is improved with the addition of Imdur - she has had some headache - may be worse in the setting of vertigo but the chest pain is not totally resolved. No NTG use. Typically has at night - she just rests and it goes away. Looks to be chronically anemic. No active bleeding anywhere. Stools look ok.   Past Medical History:  Diagnosis Date  . Allergy   . Asthma   . Blood transfusion without reported diagnosis 12/23/2004   with colon cancer  . Brain tumor (benign) ([place])   . Colon cancer [facility]) Nov. 27,  2006  . Diabetes mellitus    Type 2  . Female bladder prolapse   . Gastritis   . GERD (gastroesophageal reflux disease)   . Glaucoma   . Hypercholesterolemia   . Hypertension     Past Surgical History:  Procedure Laterality Date  . ABDOMINAL HYSTERECTOMY  1976  . BALLOON DILATION  N/A 08/23/2017   Procedure: BALLOON DILATION;  Surgeon: Milus Banister, MD;  Location: [facility];  Service: Endoscopy;  Laterality: N/A;  Esophageal Diltation  . bladder tac    . CARDIAC CATHETERIZATION  2005  . CATARACT EXTRACTION, BILATERAL    . COLONOSCOPY W/ BIOPSIES    . CORONARY STENT INTERVENTION N/A 01/17/2019   Procedure: CORONARY STENT INTERVENTION;  Surgeon: Nelva Bush, MD;  Location: [place] [facility];  Service: Cardiovascular;  Laterality: N/A;  . ESOPHAGEAL MANOMETRY N/A 03/20/2015   Procedure: ESOPHAGEAL MANOMETRY (EM);  Surgeon: Gatha Mayer, MD;  Location: [facility];  Service: Endoscopy;  Laterality: N/A;  . ESOPHAGOGASTRODUODENOSCOPY (EGD) WITH PROPOFOL N/A 08/23/2017   Procedure: ESOPHAGOGASTRODUODENOSCOPY (EGD) WITH PROPOFOL;  Surgeon: Milus Banister, MD;  Location: [facility];  Service: Endoscopy;  Laterality: N/A;  . EYE SURGERY     mole removed left eye  . INTRAVASCULAR PRESSURE WIRE/FFR STUDY N/A 01/17/2019   Procedure: INTRAVASCULAR PRESSURE WIRE/FFR STUDY;  Surgeon: Nelva Bush, MD;  Location: [place] [facility];  Service: Cardiovascular;  Laterality: N/A;  . LAPAROSCOPIC NISSEN FUNDOPLICATION  2248  . LEFT HEART CATH AND CORONARY ANGIOGRAPHY N/A 04/17/2018   Procedure: LEFT HEART CATH AND CORONARY ANGIOGRAPHY;  Surgeon: Belva Crome, MD;  Location: [place] [facility];  Service: Cardiovascular;  Laterality: N/A;  . LEFT HEART CATH AND CORONARY ANGIOGRAPHY N/A 01/17/2019   Procedure: LEFT HEART CATH AND CORONARY ANGIOGRAPHY;  Surgeon: Nelva Bush, MD;  Location: [facility];  Service: Cardiovascular;  Laterality: N/A;  . repair prolapsedbladder    . RIGHT COLECTOMY  Nov. 27, 2006  . TMJ ARTHROPLASTY    . UPPER GASTROINTESTINAL ENDOSCOPY       Medications: Current Meds  Medication Sig  . amLODipine (NORVASC) 5 MG tablet Take 1 tablet (5 mg total) by mouth daily.  Marland Kitchen aspirin 81 MG chewable tablet Chew 81 mg by mouth daily.    .  Blood Glucose Monitoring Suppl (ONETOUCH VERIO) w/Device KIT 1 each by Does not apply route daily.  . clopidogrel (PLAVIX) 75 MG tablet Take 1 tablet (75 mg total) by mouth daily.  . diclofenac sodium (VOLTAREN) 1 % GEL Apply 2 g topically as needed.  . empagliflozin (JARDIANCE) 10 MG TABS tablet Take 10 mg by mouth daily.  Marland Kitchen glucose blood (ONETOUCH VERIO) test strip Use as instructed  . Insulin Glargine (LANTUS SOLOSTAR) 100 UNIT/ML Solostar Pen Inject 15 Units into the skin daily at 10 pm.  . Insulin Pen Needle 31G X 5 MM MISC Check blood sugar once daily  . isosorbide mononitrate (IMDUR) 30 MG 24 hr tablet Take 1 tablet (30 mg total) by mouth daily.  Elmore Guise Devices (ONE TOUCH DELICA LANCING DEV) MISC Check blood sugar once daily  . nitroGLYCERIN (NITROSTAT) 0.4 MG SL tablet Place 1 tablet (0.4 mg total) under the tongue every 5 (five) minutes as needed. If chest pain not relieved by third tab call 911.  . quinapril-hydrochlorothiazide (ACCURETIC) 20-12.5 MG tablet Take 2 tablets by mouth daily.  . rosuvastatin (CRESTOR) 20 MG tablet Take 1 tablet (20 mg total) by mouth daily.     Allergies: Allergies  Allergen Reactions  . Metformin And Related Diarrhea    Social History: The patient  reports that she has never smoked. She has never used smokeless tobacco. She reports that she does not drink alcohol or use drugs.   Family History: The patient's family history includes Bone cancer in her father; Diabetes in her daughter; Heart attack in her daughter; Hypertension in her daughter; Lymphoma in an other family member; Suicidality in her son.   Review of Systems: Please see the history of present illness.   All other systems are reviewed and negative.   Physical Exam: VS:  BP 110/60   Pulse 67   Ht '5\' 4"'$  (1.626 m)   Wt 128 lb 1.9 oz (58.1 kg)   SpO2 90%   BMI 21.99 kg/m  .  BMI Body mass index is 21.99 kg/m.  Wt Readings from Last 3 Encounters:  02/11/19 128 lb 1.9 oz (58.1  kg)  01/17/19 126 lb 11.2 oz (57.5 kg)  01/15/19 129 lb 12.8 oz (58.9 kg)    General: Pleasant. Alert and in no acute distress.   HEENT: Normal.  Neck: Supple, no JVD, carotid bruits, or masses noted.  Cardiac: Regular rate and rhythm. No murmurs, rubs, or gallops. No edema.  Respiratory:  Lungs are clear to auscultation bilaterally with normal work of breathing.  GI: Soft and nontender.  MS: No deformity or atrophy. Gait and ROM intact.  Skin: Warm and dry. Color is normal.  Neuro:  Strength and sensation are intact and no gross focal deficits noted.  Psych: Alert, appropriate and with normal affect.   LABORATORY DATA:  EKG:  EKG is not ordered today.   Lab Results  Component Value Date   WBC 7.5 01/23/2019   HGB 8.9 (L) 01/23/2019   HCT 28.3 (L) 01/23/2019   PLT 206 01/23/2019   GLUCOSE 306 (H) 01/23/2019   CHOL 84 (L) 04/12/2018   TRIG 123 04/12/2018   HDL 43 04/12/2018   LDLCALC 16 04/12/2018   ALT 17 01/15/2019   AST 23 01/15/2019   NA 135 01/23/2019   K 3.6 01/23/2019   CL 102 01/23/2019   CREATININE 1.79 (H) 01/23/2019   BUN 42 (H) 01/23/2019   CO2 18 (L) 01/23/2019   TSH 0.755 01/15/2019   INR 1.0 04/03/2007   HGBA1C 8.5 (H) 01/15/2019     BNP (last 3 results) Recent Labs    01/15/19 1900  BNP 81.2    ProBNP (last 3 results) No results for input(s): PROBNP in the last 8760 hours.   Other Studies Reviewed Today:  ECHO IMPRESSIONS 01/2019   1. Left ventricular ejection fraction, by visual estimation, is 60 to 65%. The left ventricle has normal function. Normal left ventricular size. There is moderately increased left ventricular hypertrophy. The left ventricular hypertrophy involves  basal-septum walls.  2. Global right ventricle has normal systolic function.The right ventricular size is normal. No increase in right ventricular wall thickness.  3. Left atrial size was normal.  4. Right atrial size was normal.  5. The mitral valve is normal in  structure. No evidence of mitral valve regurgitation. No evidence of mitral stenosis.  6. The tricuspid valve is normal in structure. Tricuspid valve regurgitation is mild.  7. The aortic valve is normal in structure. Aortic valve regurgitation was not visualized by color flow Doppler. Structurally normal aortic valve, with no evidence of sclerosis or stenosis.  8. The pulmonic valve was normal in structure. Pulmonic valve regurgitation is not visualized by color flow Doppler.  9. Normal pulmonary artery systolic pressure. 10. The inferior vena cava is normal in size with greater than 50% respiratory variability, suggesting right atrial pressure of 3 mmHg. 11. Elevated left ventricular end-diastolic pressure. 12. Left ventricular diastolic Doppler parameters are indeterminate pattern of LV diastolic filling.    CORONARY STENT INTERVENTION 01/2019  INTRAVASCULAR PRESSURE WIRE/FFR STUDY  LEFT HEART CATH AND CORONARY ANGIOGRAPHY  Conclusion  Conclusions: 1. Multivessel CAD, including 90% apical LAD stenosis, 50-60% mid/distal LCx lesion (DFR 0.97) and diffuse RCA disease with focal 60-70% mid vessel stenosis (DFR 0.83).  Overall appearance is similar to the prior catheterization from 04/2018. 2. Normal left ventricular filling pressure. 3. Successful DFR-guided PCI to the mid RCA using Synergy 2.5 x 16 mm drug-eluting stent with 0% residual stenosis and TIMI-3 flow.  Recommendations: 1. Continue medical therapy for apical LAD and non-obstructive LCx disease. 2. Aggressive secondary prevention. 3. Continue up to 12 months of dual antiplatelet therapy with aspirin and clopidogrel, as tolerated in the setting of chronic anemia. 4. Obtain echo to reassess LV function.  Nelva Bush, MD      Assessment/Plan:  1. CAD with recent bouts of unstable angina, s/p cath with PCI to the RCA - has residual disease to treat medically - has had Imdur added - some headache with this - seems worse  since she now has vertigo. For now, no change and will see back in a few weeks to recheck her. Lab today.   2. HTN - BP is great - no changes made today.   3. HLD - on statin - LDL is 16  on Crestor.   4. DM - poorly controlled.   5. CKD - recheck lab today.   6. Chronic anemia - rechecking lab today.   7. Vertigo - ok to try Meclizine  8. COVID-19 Education: The signs and symptoms of COVID-19 were discussed with the patient and how to seek care for testing (follow up with PCP or arrange E-visit).  The importance of social distancing, staying at home, hand hygiene and wearing a mask when out in public were discussed today.  Current medicines are reviewed with the patient today.  The patient does not have concerns regarding medicines other than what has been noted above.  The following changes have been made:  See above.  Labs/ tests ordered today include:    Orders Placed This Encounter  Procedures  . Basic metabolic panel  . CBC     Disposition:   FU with me in a few weeks for a recheck.   Patient is agreeable to this plan and will call if any problems develop in the interim.   SignedTruitt Merle, NP  02/11/2019 3:12 PM  [facility] [address] [place], [place]  [address] Phone: [phone] Fax: [phone]

## 2019-02-11 ENCOUNTER — Other Ambulatory Visit: Payer: Self-pay

## 2019-02-11 ENCOUNTER — Encounter: Payer: Self-pay | Admitting: Nurse Practitioner

## 2019-02-11 ENCOUNTER — Ambulatory Visit (INDEPENDENT_AMBULATORY_CARE_PROVIDER_SITE_OTHER): Payer: Medicare Other | Admitting: Nurse Practitioner

## 2019-02-11 VITALS — BP 110/60 | HR 67 | Ht 64.0 in | Wt 128.1 lb

## 2019-02-11 DIAGNOSIS — I10 Essential (primary) hypertension: Secondary | ICD-10-CM

## 2019-02-11 DIAGNOSIS — Z7189 Other specified counseling: Secondary | ICD-10-CM | POA: Diagnosis not present

## 2019-02-11 DIAGNOSIS — Z955 Presence of coronary angioplasty implant and graft: Secondary | ICD-10-CM

## 2019-02-11 DIAGNOSIS — I25118 Atherosclerotic heart disease of native coronary artery with other forms of angina pectoris: Secondary | ICD-10-CM | POA: Diagnosis not present

## 2019-02-11 NOTE — Patient Instructions (Addendum)
After Visit Summary:  We will be checking the following labs today - BMET & CBC   Medication Instructions:    Continue with your current medicines.   Get Meclizine/Antivert 25 mg and take 3 times a day as needed for the dizziness.    If you need a refill on your cardiac medications before your next appointment, please call your pharmacy.     Testing/Procedures To Be Arranged:  N/A  Follow-Up:   See me in about 3 weeks.     At [facility] you and your health needs are our priority.  As part of our continuing mission to provide you with exceptional heart care, we have created designated Provider Care Teams.  These Care Teams include your primary Cardiologist (physician) and Advanced Practice Providers (APPs -  Physician Assistants and Nurse Practitioners) who all work together to provide you with the care you need, when you need it.  Special Instructions:  . Stay safe, stay home, wash your hands for at least 20 seconds and wear a mask when out in public.  . It was good to talk with you today.    Call the [place] office at [phone] if you have any questions, problems or concerns.

## 2019-02-12 ENCOUNTER — Other Ambulatory Visit: Payer: Self-pay | Admitting: *Deleted

## 2019-02-12 DIAGNOSIS — N189 Chronic kidney disease, unspecified: Secondary | ICD-10-CM

## 2019-02-12 LAB — BASIC METABOLIC PANEL
BUN/Creatinine Ratio: 28 (ref 12–28)
BUN: 55 mg/dL — ABNORMAL HIGH (ref 8–27)
CO2: 23 mmol/L (ref 20–29)
Calcium: 9.9 mg/dL (ref 8.7–10.3)
Chloride: 100 mmol/L (ref 96–106)
Creatinine, Ser: 1.96 mg/dL — ABNORMAL HIGH (ref 0.57–1.00)
GFR calc Af Amer: 29 mL/min/{1.73_m2} — ABNORMAL LOW (ref >59)
GFR calc non Af Amer: 25 mL/min/{1.73_m2} — ABNORMAL LOW (ref >59)
Glucose: 272 mg/dL — ABNORMAL HIGH (ref 65–99)
Potassium: 3.4 mmol/L — ABNORMAL LOW (ref 3.5–5.2)
Sodium: 139 mmol/L (ref 134–144)

## 2019-02-12 LAB — CBC
Hematocrit: 27.8 % — ABNORMAL LOW (ref 34.0–46.6)
Hemoglobin: 9 g/dL — ABNORMAL LOW (ref 11.1–15.9)
MCH: 26.5 pg — ABNORMAL LOW (ref 26.6–33.0)
MCHC: 32.4 g/dL (ref 31.5–35.7)
MCV: 82 fL (ref 79–97)
Platelets: 251 10*3/uL (ref 150–450)
RBC: 3.39 x10E6/uL — ABNORMAL LOW (ref 3.77–5.28)
RDW: 13.9 % (ref 11.7–15.4)
WBC: 8 10*3/uL (ref 3.4–10.8)

## 2019-02-21 NOTE — Progress Notes (Signed)
CARDIOLOGY OFFICE NOTE  Date:  02/26/2019    [name] Date of Birth: 10-Apr-1946 Medical Record #[id number]  PCP:  [name], MD  Cardiologist:  Jennings Books  Chief Complaint  Patient presents with   Follow-up    Seen for Dr. Tamala Julian    History of Present Illness: [name] is a 73 y.o. female who presents today for a 2 week check. Seen for Dr. Tamala Julian.   She has type 2 DM, HTN, HLD, CAD, & prior brain tumor.    Seen here back in October with increasing chest pain - EKG was abnormal - she was referred for admission and plans for cardiac catheterization. She was found to have multivessel CAD with 60 to 70% stenosis in the RCA which was stented. Discharged on DAPT. She did have post procedural epistaxis. She then ended up in the ER a week later with chest pain - medical therapy was escalated at that time.   I saw her earlier this month for her post hospital visit - she was having vertigo. Chest pain was better with the addition of Imdur but not totally resolved. Was hesitant to increase given her acute bout of vertigo she was experiencing.   The patient does not have symptoms concerning for COVID-19 infection (fever, chills, cough, or new shortness of breath).   Comes in today. Here alone. She notes her vertigo/dizziness continues - she is using Meclizine - some help but makes her very sleepy. She feels staggery. She has had a history of a meningioma - has not had follow up for 3 years. Her chest pain is "off and on" - mostly more "on" - she has used NTG with relief on 4 occasions - typically while she is trying to do something. Has not seen PCP in some time.   Past Medical History:  Diagnosis Date   Allergy    Asthma    Blood transfusion without reported diagnosis 12/23/2004   with colon cancer   Brain tumor (benign) ([place])    Colon cancer ([place]) Nov. 27,  2006   Diabetes mellitus    Type 2   Female bladder prolapse    Gastritis    GERD  (gastroesophageal reflux disease)    Glaucoma    Hypercholesterolemia    Hypertension     Past Surgical History:  Procedure Laterality Date   ABDOMINAL HYSTERECTOMY  1976   BALLOON DILATION N/A 08/23/2017   Procedure: BALLOON DILATION;  Surgeon: Milus Banister, MD;  [facility]: [facility];  Service: Endoscopy;  Laterality: N/A;  Esophageal Diltation   bladder tac     CARDIAC CATHETERIZATION  2005   CATARACT EXTRACTION, BILATERAL     COLONOSCOPY W/ BIOPSIES     CORONARY STENT INTERVENTION N/A 01/17/2019   Procedure: CORONARY STENT INTERVENTION;  Surgeon: Nelva Bush, MD;  Location: [place] [facility];  Service: Cardiovascular;  Laterality: N/A;   ESOPHAGEAL MANOMETRY N/A 03/20/2015   Procedure: ESOPHAGEAL MANOMETRY (EM);  Surgeon: Gatha Mayer, MD;  [facility]: [facility];  Service: Endoscopy;  Laterality: N/A;   ESOPHAGOGASTRODUODENOSCOPY (EGD) WITH PROPOFOL N/A 08/23/2017   Procedure: ESOPHAGOGASTRODUODENOSCOPY (EGD) WITH PROPOFOL;  Surgeon: Milus Banister, MD;  [facility];  Service: Endoscopy;  Laterality: N/A;   EYE SURGERY     mole removed left eye   INTRAVASCULAR PRESSURE WIRE/FFR STUDY N/A 01/17/2019   Procedure: INTRAVASCULAR PRESSURE WIRE/FFR STUDY;  Surgeon: Nelva Bush, MD;  Location: [place] [facility];  Service: Cardiovascular;  Laterality: N/A;   LAPAROSCOPIC NISSEN FUNDOPLICATION  [id number]   LEFT HEART CATH AND CORONARY ANGIOGRAPHY N/A 04/17/2018   Procedure: LEFT HEART CATH AND CORONARY ANGIOGRAPHY;  Surgeon: Belva Crome, MD;  Location: [place] [facility];  Service: Cardiovascular;  Laterality: N/A;   LEFT HEART CATH AND CORONARY ANGIOGRAPHY N/A 01/17/2019   Procedure: LEFT HEART CATH AND CORONARY ANGIOGRAPHY;  Surgeon: Nelva Bush, MD;  Location: [place] [facility];  Service: Cardiovascular;  Laterality: N/A;   repair prolapsedbladder     RIGHT COLECTOMY  Nov. 27, 2006   TMJ ARTHROPLASTY     UPPER GASTROINTESTINAL ENDOSCOPY        Medications: Current Meds  Medication Sig   amLODipine (NORVASC) 5 MG tablet Take 1 tablet (5 mg total) by mouth daily.   aspirin 81 MG chewable tablet Chew 81 mg by mouth daily.     Blood Glucose Monitoring Suppl (ONETOUCH VERIO) w/Device KIT 1 each by Does not apply route daily.   clopidogrel (PLAVIX) 75 MG tablet Take 1 tablet (75 mg total) by mouth daily.   diclofenac sodium (VOLTAREN) 1 % GEL Apply 2 g topically as needed.   empagliflozin (JARDIANCE) 10 MG TABS tablet Take 10 mg by mouth daily.   glucose blood (ONETOUCH VERIO) test strip Use as instructed   Insulin Glargine (LANTUS SOLOSTAR) 100 UNIT/ML Solostar Pen Inject 15 Units into the skin daily at 10 pm.   Insulin Pen Needle 31G X 5 MM MISC Check blood sugar once daily   Lancet Devices (ONE TOUCH DELICA LANCING DEV) MISC Check blood sugar once daily   nitroGLYCERIN (NITROSTAT) 0.4 MG SL tablet Place 1 tablet (0.4 mg total) under the tongue every 5 (five) minutes as needed. If chest pain not relieved by third tab call 911.   quinapril-hydrochlorothiazide (ACCURETIC) 20-12.5 MG tablet Take 2 tablets by mouth daily.   rosuvastatin (CRESTOR) 20 MG tablet Take 1 tablet (20 mg total) by mouth daily.   [DISCONTINUED] isosorbide mononitrate (IMDUR) 30 MG 24 hr tablet Take 1 tablet (30 mg total) by mouth daily.     Allergies: Allergies  Allergen Reactions   Metformin And Related Diarrhea    Social History: The patient  reports that she has never smoked. She has never used smokeless tobacco. She reports that she does not drink alcohol or use drugs.   Family History: The patient's family history includes Bone cancer in her father; Diabetes in her daughter; Heart attack in her daughter; Hypertension in her daughter; Lymphoma in an other family member; Suicidality in her son.   Review of Systems: Please see the history of present illness.   All other systems are reviewed and negative.   Physical Exam: VS:   BP (!) 120/56    Pulse 61    Ht _0  (1.626 m)    Wt 128 lb (58.1 kg)    SpO2 98%    BMI 21.97 kg/m  .  BMI Body mass index is 21.97 kg/m.  Wt Readings from Last 3 Encounters:  02/26/19 128 lb (58.1 kg)  02/11/19 128 lb 1.9 oz (58.1 kg)  01/17/19 126 lb 11.2 oz (57.5 kg)    General: Pleasant. Alert and in no acute distress. Little unsteady and staggery on ambulation.   HEENT: Normal.  Neck: Supple, no JVD, carotid bruits, or masses noted.  Cardiac: Regular rate and rhythm. No murmurs, rubs, or gallops. No edema.  Respiratory:  Lungs are clear to auscultation bilaterally with normal work of breathing.  GI: Soft and nontender.  MS: No deformity or atrophy. Gait and ROM intact.  Skin: Warm and dry. Color is normal.  Neuro:  Strength and sensation are intact and no gross focal deficits noted.  Psych: Alert, appropriate and with normal affect.   LABORATORY DATA:  EKG:  EKG is not ordered today.   Lab Results  Component Value Date   WBC 8.0 02/11/2019   HGB 9.0 (L) 02/11/2019   HCT 27.8 (L) 02/11/2019   PLT 251 02/11/2019   GLUCOSE 272 (H) 02/11/2019   CHOL 84 (L) 04/12/2018   TRIG 123 04/12/2018   HDL 43 04/12/2018   LDLCALC 16 04/12/2018   ALT 17 01/15/2019   AST 23 01/15/2019   NA 139 02/11/2019   K 3.4 (L) 02/11/2019   CL 100 02/11/2019   CREATININE 1.96 (H) 02/11/2019   BUN 55 (H) 02/11/2019   CO2 23 02/11/2019   TSH 0.755 01/15/2019   INR 1.0 04/03/2007   HGBA1C 8.5 (H) 01/15/2019     BNP (last 3 results) Recent Labs    01/15/19 1900  BNP 81.2    ProBNP (last 3 results) No results for input(s): PROBNP in the last 8760 hours.   Other Studies Reviewed Today:  ECHO IMPRESSIONS 01/2019  1. Left ventricular ejection fraction, by visual estimation, is 60 to 65%. The left ventricle has normal function. Normal left ventricular size. There is moderately increased left ventricular hypertrophy. The left ventricular hypertrophy involves  basal-septum  walls. 2. Global right ventricle has normal systolic function.The right ventricular size is normal. No increase in right ventricular wall thickness. 3. Left atrial size was normal. 4. Right atrial size was normal. 5. The mitral valve is normal in structure. No evidence of mitral valve regurgitation. No evidence of mitral stenosis. 6. The tricuspid valve is normal in structure. Tricuspid valve regurgitation is mild. 7. The aortic valve is normal in structure. Aortic valve regurgitation was not visualized by color flow Doppler. Structurally normal aortic valve, with no evidence of sclerosis or stenosis. 8. The pulmonic valve was normal in structure. Pulmonic valve regurgitation is not visualized by color flow Doppler. 9. Normal pulmonary artery systolic pressure. 10. The inferior vena cava is normal in size with greater than 50% respiratory variability, suggesting right atrial pressure of 3 mmHg. 11. Elevated left ventricular end-diastolic pressure. 12. Left ventricular diastolic Doppler parameters are indeterminate pattern of LV diastolic filling.    CORONARY STENT INTERVENTION 01/2019  INTRAVASCULAR PRESSURE WIRE/FFR STUDY  LEFT HEART CATH AND CORONARY ANGIOGRAPHY  Conclusion  Conclusions: 1. Multivessel CAD, including 90% apical LAD stenosis, 50-60% mid/distal LCx lesion (DFR 0.97) and diffuse RCA disease with focal 60-70% mid vessel stenosis (DFR 0.83). Overall appearance is similar to the prior catheterization from 04/2018. 2. Normal left ventricular filling pressure. 3. Successful DFR-guided PCI to the mid RCA using Synergy 2.5 x 16 mm drug-eluting stent with 0% residual stenosis and TIMI-3 flow.  Recommendations: 1. Continue medical therapy for apical LAD and non-obstructive LCx disease. 2. Aggressive secondary prevention. 3. Continue up to 12 months of dual antiplatelet therapy with aspirin and clopidogrel, as tolerated in the setting of chronic anemia. 4. Obtain echo  to reassess LV function.  Nelva Bush, MD      Assessment/Plan:  1. CAD with recent bouts of unstable angina, s/p cath with PCI to the RCA - she does have residual disease - it has been responsive to NTG and she is on long standing nitrate - will increase this today.  Remains on DAPT as well.   2. HTN - BP is great  3. HLD - on statin  4. DM - poorly controlled - I asked her to touch base with PCP  5. Vertigo/dizziness - does have history of a meningioma - will arrange for CT of the head. Will refer to neuro rehab as well for vestibular rehab.   6. CKD - rechecking her lab.  7. Prior hypokalemia - rechecking today.   8. Chronic anemia - rechecking lab today.   9. COVID-19 Education: The signs and symptoms of COVID-19 were discussed with the patient and how to seek care for testing (follow up with PCP or arrange E-visit).  The importance of social distancing, staying at home, hand hygiene and wearing a mask when out in public were discussed today.  Current medicines are reviewed with the patient today.  The patient does not have concerns regarding medicines other than what has been noted above.  The following changes have been made:  See above.  Labs/ tests ordered today include:    Orders Placed This Encounter  Procedures   CT Head Wo Contrast   Basic metabolic panel   CBC no Diff   Ambulatory referral to Physical Therapy     Disposition:   FU with [place] in about a month.  Patient is agreeable to this plan and will call if any problems develop in the interim.   SignedTruitt Merle, NP  02/26/2019 10:39 AM  [facility] [address] [place], [place]  [address] Phone: [phone] Fax: [phone]

## 2019-02-26 ENCOUNTER — Other Ambulatory Visit: Payer: Self-pay

## 2019-02-26 ENCOUNTER — Encounter: Payer: Self-pay | Admitting: Nurse Practitioner

## 2019-02-26 ENCOUNTER — Ambulatory Visit (INDEPENDENT_AMBULATORY_CARE_PROVIDER_SITE_OTHER): Payer: Medicare Other | Admitting: Nurse Practitioner

## 2019-02-26 VITALS — BP 120/56 | HR 61 | Ht 64.0 in | Wt 128.0 lb

## 2019-02-26 DIAGNOSIS — I10 Essential (primary) hypertension: Secondary | ICD-10-CM | POA: Diagnosis not present

## 2019-02-26 DIAGNOSIS — N189 Chronic kidney disease, unspecified: Secondary | ICD-10-CM

## 2019-02-26 DIAGNOSIS — R42 Dizziness and giddiness: Secondary | ICD-10-CM

## 2019-02-26 DIAGNOSIS — H814 Vertigo of central origin: Secondary | ICD-10-CM

## 2019-02-26 DIAGNOSIS — I25118 Atherosclerotic heart disease of native coronary artery with other forms of angina pectoris: Secondary | ICD-10-CM | POA: Diagnosis not present

## 2019-02-26 DIAGNOSIS — D329 Benign neoplasm of meninges, unspecified: Secondary | ICD-10-CM

## 2019-02-26 LAB — CBC
Hematocrit: 28.2 % — ABNORMAL LOW (ref 34.0–46.6)
Hemoglobin: 8.9 g/dL — ABNORMAL LOW (ref 11.1–15.9)
MCH: 26.5 pg — ABNORMAL LOW (ref 26.6–33.0)
MCHC: 31.6 g/dL (ref 31.5–35.7)
MCV: 84 fL (ref 79–97)
Platelets: 254 10*3/uL (ref 150–450)
RBC: 3.36 x10E6/uL — ABNORMAL LOW (ref 3.77–5.28)
RDW: 14 % (ref 11.7–15.4)
WBC: 8 10*3/uL (ref 3.4–10.8)

## 2019-02-26 LAB — BASIC METABOLIC PANEL
BUN/Creatinine Ratio: 33 — ABNORMAL HIGH (ref 12–28)
BUN: 52 mg/dL — ABNORMAL HIGH (ref 8–27)
CO2: 22 mmol/L (ref 20–29)
Calcium: 10 mg/dL (ref 8.7–10.3)
Chloride: 107 mmol/L — ABNORMAL HIGH (ref 96–106)
Creatinine, Ser: 1.6 mg/dL — ABNORMAL HIGH (ref 0.57–1.00)
GFR calc Af Amer: 37 mL/min/{1.73_m2} — ABNORMAL LOW (ref >59)
GFR calc non Af Amer: 32 mL/min/{1.73_m2} — ABNORMAL LOW (ref >59)
Glucose: 183 mg/dL — ABNORMAL HIGH (ref 65–99)
Potassium: 4.1 mmol/L (ref 3.5–5.2)
Sodium: 145 mmol/L — ABNORMAL HIGH (ref 134–144)

## 2019-02-26 MED ORDER — ISOSORBIDE MONONITRATE ER 60 MG PO TB24: 60.0000 mg | ORAL_TABLET | Freq: Every day | ORAL | 3 refills | Status: DC

## 2019-02-26 NOTE — Patient Instructions (Addendum)
After Visit Summary:  We will be checking the following labs today - BMET and CBC   Medication Instructions:    Continue with your current medicines. BUT  I am increasing Imdur to 60 mg a day - you can take 2 of the 30 mg tablets and use up - I will send the RX for the 60 mg to your pharmacy   If you need a refill on your cardiac medications before your next appointment, please call your pharmacy.     Testing/Procedures To Be Arranged:  CT of the head without contrast   Follow-Up:   See me or Dr. Tamala Julian in about a month  Referral to Vestibular Rehab - this will be to help get rid of your vertigo/dizziness    At [facility], you and your health needs are our priority.  As part of our continuing mission to provide you with exceptional heart care, we have created designated Provider Care Teams.  These Care Teams include your primary Cardiologist (physician) and Advanced Practice Providers (APPs -  Physician Assistants and Nurse Practitioners) who all work together to provide you with the care you need, when you need it.  Special Instructions:  . Stay safe, stay home, wash your hands for at least 20 seconds and wear a mask when out in public.  . It was good to talk with you today.    Call the [place] office at [phone] if you have any questions, problems or concerns.

## 2019-03-04 ENCOUNTER — Other Ambulatory Visit: Payer: Self-pay | Admitting: *Deleted

## 2019-03-04 MED ORDER — QUINAPRIL-HYDROCHLOROTHIAZIDE 20-12.5 MG PO TABS: 1.0000 | ORAL_TABLET | Freq: Every day | ORAL | 3 refills | Status: DC

## 2019-03-06 ENCOUNTER — Ambulatory Visit
Admission: RE | Admit: 2019-03-06 | Discharge: 2019-03-06 | Disposition: A | Discharge status: Home / Self Care | Payer: Medicare Other | Source: Ambulatory Visit | Attending: Nurse Practitioner | Admitting: Nurse Practitioner

## 2019-03-06 DIAGNOSIS — D329 Benign neoplasm of meninges, unspecified: Secondary | ICD-10-CM

## 2019-03-06 DIAGNOSIS — R519 Headache, unspecified: Secondary | ICD-10-CM | POA: Diagnosis not present

## 2019-03-06 DIAGNOSIS — R42 Dizziness and giddiness: Secondary | ICD-10-CM

## 2019-03-11 ENCOUNTER — Other Ambulatory Visit: Payer: Self-pay | Admitting: *Deleted

## 2019-03-11 MED ORDER — ONETOUCH VERIO VI STRP: ORAL_STRIP | 12 refills | Status: DC

## 2019-03-25 ENCOUNTER — Other Ambulatory Visit: Payer: Self-pay | Admitting: *Deleted

## 2019-03-25 DIAGNOSIS — E1165 Type 2 diabetes mellitus with hyperglycemia: Secondary | ICD-10-CM

## 2019-03-26 MED ORDER — JARDIANCE 10 MG PO TABS: 10.0000 mg | ORAL_TABLET | Freq: Every day | ORAL | 2 refills | Status: DC

## 2019-03-27 NOTE — Progress Notes (Signed)
CARDIOLOGY OFFICE NOTE  Date:  04/02/2019    [name] Date of Birth: [birth year] Medical Record #[id number]  PCP:  [name], MD  Cardiologist:  Jennings Books   Chief Complaint  Patient presents with  . Follow-up    Seen for Dr. Tamala Julian    History of Present Illness: [name] is a 73 y.o. female who presents today for a one month check. Seen for Dr. Tamala Julian.   She has type 2 DM, HTN, HLD, CAD,&prior brain tumor.  Seen here back in October with increasing chest pain - EKG was abnormal - she was referred for admission and plans for cardiac catheterization. She was found to have multivessel CAD with 60 to 70% stenosis in the RCA which was stented. Discharged on DAPT. She did have post procedural epistaxis. She then ended up in the ER a week later with chest pain - medical therapywasescalatedat that time.  I saw her in early November for her post hospital visit - she was having vertigo. Chest pain was better with the addition of Imdur but not totally resolved. Was hesitant to increase given her acute bout of vertigo she was experiencing. Last seen a month ago - she was improving - still with vertigo and we referred her for vestibular rehab. Still with some NTG use.   The patient does not have symptoms concerning for COVID-19 infection (fever, chills, cough, or new shortness of breath).   Comes in today. Here alone. She feels like she is doing better. Still with some vertigo - never heard from vestibular rehab - we will see what we can find out. Less chest pain. Less NTG use - she feels like she is able to do more. BP is great. She is seeing Renal in just a few weeks. Her CT was stable.   Past Medical History:  Diagnosis Date  . Allergy   . Asthma   . Blood transfusion without reported diagnosis 12/23/2004   with colon cancer  . Brain tumor (benign) ([place])   . Colon cancer [facility]) Nov. 27,  2006  . Diabetes mellitus    Type 2  . Female bladder prolapse    . Gastritis   . GERD (gastroesophageal reflux disease)   . Glaucoma   . Hypercholesterolemia   . Hypertension     Past Surgical History:  Procedure Laterality Date  . ABDOMINAL HYSTERECTOMY  1976  . BALLOON DILATION N/A 08/23/2017   Procedure: BALLOON DILATION;  Surgeon: Milus Banister, MD;  Location: [facility];  Service: Endoscopy;  Laterality: N/A;  Esophageal Diltation  . bladder tac    . CARDIAC CATHETERIZATION  2005  . CATARACT EXTRACTION, BILATERAL    . COLONOSCOPY W/ BIOPSIES    . CORONARY STENT INTERVENTION N/A 01/17/2019   Procedure: CORONARY STENT INTERVENTION;  Surgeon: Nelva Bush, MD;  Location: [place] [facility];  Service: Cardiovascular;  Laterality: N/A;  . ESOPHAGEAL MANOMETRY N/A 03/20/2015   Procedure: ESOPHAGEAL MANOMETRY (EM);  Surgeon: Gatha Mayer, MD;  Location: [facility];  Service: Endoscopy;  Laterality: N/A;  . ESOPHAGOGASTRODUODENOSCOPY (EGD) WITH PROPOFOL N/A 08/23/2017   Procedure: ESOPHAGOGASTRODUODENOSCOPY (EGD) WITH PROPOFOL;  Surgeon: Milus Banister, MD;  Location: [facility];  Service: Endoscopy;  Laterality: N/A;  . EYE SURGERY     mole removed left eye  . INTRAVASCULAR PRESSURE WIRE/FFR STUDY N/A 01/17/2019   Procedure: INTRAVASCULAR PRESSURE WIRE/FFR STUDY;  Surgeon: Nelva Bush, MD;  Location: [place] [facility];  Service:  Cardiovascular;  Laterality: N/A;  . LAPAROSCOPIC NISSEN FUNDOPLICATION  4081  . LEFT HEART CATH AND CORONARY ANGIOGRAPHY N/A 04/17/2018   Procedure: LEFT HEART CATH AND CORONARY ANGIOGRAPHY;  Surgeon: Belva Crome, MD;  Location: [place] [facility];  Service: Cardiovascular;  Laterality: N/A;  . LEFT HEART CATH AND CORONARY ANGIOGRAPHY N/A 01/17/2019   Procedure: LEFT HEART CATH AND CORONARY ANGIOGRAPHY;  Surgeon: Nelva Bush, MD;  Location: [place] [facility];  Service: Cardiovascular;  Laterality: N/A;  . repair prolapsedbladder    . RIGHT COLECTOMY  Nov. 27, 2006  . TMJ ARTHROPLASTY    . UPPER  GASTROINTESTINAL ENDOSCOPY       Medications: Current Meds  Medication Sig  . amLODipine (NORVASC) 5 MG tablet Take 1 tablet (5 mg total) by mouth daily.  Marland Kitchen aspirin 81 MG chewable tablet Chew 81 mg by mouth daily.    . Blood Glucose Monitoring Suppl (ONETOUCH VERIO) w/Device KIT 1 each by Does not apply route daily.  . clopidogrel (PLAVIX) 75 MG tablet Take 1 tablet (75 mg total) by mouth daily.  . diclofenac sodium (VOLTAREN) 1 % GEL Apply 2 g topically as needed.  . empagliflozin (JARDIANCE) 10 MG TABS tablet Take 10 mg by mouth daily.  Marland Kitchen glucose blood (ONETOUCH VERIO) test strip Use as instructed  . Insulin Glargine (LANTUS SOLOSTAR) 100 UNIT/ML Solostar Pen Inject 15 Units into the skin daily at 10 pm.  . Insulin Pen Needle 31G X 5 MM MISC Check blood sugar once daily  . isosorbide mononitrate (IMDUR) 60 MG 24 hr tablet Take 1 tablet (60 mg total) by mouth daily.  Elmore Guise Devices (ONE TOUCH DELICA LANCING DEV) MISC Check blood sugar once daily  . nitroGLYCERIN (NITROSTAT) 0.4 MG SL tablet Place 1 tablet (0.4 mg total) under the tongue every 5 (five) minutes as needed. If chest pain not relieved by third tab call 911.  . quinapril-hydrochlorothiazide (ACCURETIC) 20-12.5 MG tablet Take 1 tablet by mouth daily.  . rosuvastatin (CRESTOR) 20 MG tablet Take 1 tablet (20 mg total) by mouth daily.     Allergies: Allergies  Allergen Reactions  . Metformin And Related Diarrhea    Social History: The patient  reports that she has never smoked. She has never used smokeless tobacco. She reports that she does not drink alcohol or use drugs.   Family History: The patient's family history includes Bone cancer in her father; Diabetes in her daughter; Heart attack in her daughter; Hypertension in her daughter; Lymphoma in an other family member; Suicidality in her son.   Review of Systems: Please see the history of present illness.   All other systems are reviewed and negative.   Physical  Exam: VS:  BP 122/64   Pulse (!) 59   Ht '5\' 4"'$  (1.626 m)   Wt 131 lb 12.8 oz (59.8 kg)   SpO2 99%   BMI 22.62 kg/m  .  BMI Body mass index is 22.62 kg/m.  Wt Readings from Last 3 Encounters:  04/02/19 131 lb 12.8 oz (59.8 kg)  02/26/19 128 lb (58.1 kg)  02/11/19 128 lb 1.9 oz (58.1 kg)    General: Pleasant. Alert and in no acute distress. She looks good.   HEENT: Normal.  Neck: Supple, no JVD, carotid bruits, or masses noted.  Cardiac: Regular rate and rhythm. No murmurs, rubs, or gallops. No edema.  Respiratory:  Lungs are clear to auscultation bilaterally with normal work of breathing.  GI: Soft and nontender.  MS: No deformity or atrophy. Gait and ROM intact.  Skin: Warm and dry. Color is normal.  Neuro:  Strength and sensation are intact and no gross focal deficits noted.  Psych: Alert, appropriate and with normal affect.   LABORATORY DATA:  EKG:  EKG is ordered today.  Lab Results  Component Value Date   WBC 8.0 02/26/2019   HGB 8.9 (L) 02/26/2019   HCT 28.2 (L) 02/26/2019   PLT 254 02/26/2019   GLUCOSE 183 (H) 02/26/2019   CHOL 84 (L) 04/12/2018   TRIG 123 04/12/2018   HDL 43 04/12/2018   LDLCALC 16 04/12/2018   ALT 17 01/15/2019   AST 23 01/15/2019   NA 145 (H) 02/26/2019   K 4.1 02/26/2019   CL 107 (H) 02/26/2019   CREATININE 1.60 (H) 02/26/2019   BUN 52 (H) 02/26/2019   CO2 22 02/26/2019   TSH 0.755 01/15/2019   INR 1.0 04/03/2007   HGBA1C 8.5 (H) 01/15/2019     BNP (last 3 results) Recent Labs    01/15/19 1900  BNP 81.2    ProBNP (last 3 results) No results for input(s): PROBNP in the last 8760 hours.   Other Studies Reviewed Today:  ECHOIMPRESSIONS10/2020  1. Left ventricular ejection fraction, by visual estimation, is 60 to 65%. The left ventricle has normal function. Normal left ventricular size. There is moderately increased left ventricular hypertrophy. The left ventricular hypertrophy involves  basal-septum walls. 2.  Global right ventricle has normal systolic function.The right ventricular size is normal. No increase in right ventricular wall thickness. 3. Left atrial size was normal. 4. Right atrial size was normal. 5. The mitral valve is normal in structure. No evidence of mitral valve regurgitation. No evidence of mitral stenosis. 6. The tricuspid valve is normal in structure. Tricuspid valve regurgitation is mild. 7. The aortic valve is normal in structure. Aortic valve regurgitation was not visualized by color flow Doppler. Structurally normal aortic valve, with no evidence of sclerosis or stenosis. 8. The pulmonic valve was normal in structure. Pulmonic valve regurgitation is not visualized by color flow Doppler. 9. Normal pulmonary artery systolic pressure. 10. The inferior vena cava is normal in size with greater than 50% respiratory variability, suggesting right atrial pressure of 3 mmHg. 11. Elevated left ventricular end-diastolic pressure. 12. Left ventricular diastolic Doppler parameters are indeterminate pattern of LV diastolic filling.    CORONARY STENT INTERVENTION10/2020  INTRAVASCULAR PRESSURE WIRE/FFR STUDY  LEFT HEART CATH AND CORONARY ANGIOGRAPHY  Conclusion  Conclusions: 1. Multivessel CAD, including 90% apical LAD stenosis, 50-60% mid/distal LCx lesion (DFR 0.97) and diffuse RCA disease with focal 60-70% mid vessel stenosis (DFR 0.83). Overall appearance is similar to the prior catheterization from 04/2018. 2. Normal left ventricular filling pressure. 3. Successful DFR-guided PCI to the mid RCA using Synergy 2.5 x 16 mm drug-eluting stent with 0% residual stenosis and TIMI-3 flow.  Recommendations: 1. Continue medical therapy for apical LAD and non-obstructive LCx disease. 2. Aggressive secondary prevention. 3. Continue up to 12 months of dual antiplatelet therapy with aspirin and clopidogrel, as tolerated in the setting of chronic anemia. 4. Obtain echo to reassess  LV function.  Nelva Bush, MD      Assessment/Plan:  1. CAD - has had prior PCI to the RCA and known residual disease - doing much better with current medical therapy. I have left her on her current regimen. Remains on DAPT.   2. HTN - BP is great. No changes made today.   3. HLD -  on statin  4. CKD - seeing Renal in just a few weeks - lab today.   5. Vertigo - will see about her appointment with vestibular rehab.   6. DM - poorly controlled - I asked her to touch base with PCP  7. Chronic anemia - rechecking lab today. Probably reflection of her CKD as well.   8. COVID-19 Education: The signs and symptoms of COVID-19 were discussed with the patient and how to seek care for testing (follow up with PCP or arrange E-visit).  The importance of social distancing, staying at home, hand hygiene and wearing a mask when out in public were discussed today.  Current medicines are reviewed with the patient today.  The patient does not have concerns regarding medicines other than what has been noted above.  The following changes have been made:  See above.  Labs/ tests ordered today include:    Orders Placed This Encounter  Procedures  . Basic metabolic panel  . CBC no Diff     Disposition:   FU with Dr. Tamala Julian in about 3 months. I am happy to see back as needed.    Patient is agreeable to this plan and will call if any problems develop in the interim.   SignedTruitt Merle, NP  04/02/2019 11:15 AM  [place] [address] [place], [place]  [address] Phone: [phone] Fax: [phone]

## 2019-04-02 ENCOUNTER — Ambulatory Visit (INDEPENDENT_AMBULATORY_CARE_PROVIDER_SITE_OTHER): Payer: Medicare Other | Admitting: Nurse Practitioner

## 2019-04-02 ENCOUNTER — Other Ambulatory Visit: Payer: Self-pay

## 2019-04-02 ENCOUNTER — Encounter: Payer: Self-pay | Admitting: Nurse Practitioner

## 2019-04-02 ENCOUNTER — Telehealth: Payer: Self-pay | Admitting: Nurse Practitioner

## 2019-04-02 VITALS — BP 122/64 | HR 59 | Ht 64.0 in | Wt 131.8 lb

## 2019-04-02 DIAGNOSIS — I10 Essential (primary) hypertension: Secondary | ICD-10-CM

## 2019-04-02 DIAGNOSIS — I25118 Atherosclerotic heart disease of native coronary artery with other forms of angina pectoris: Secondary | ICD-10-CM | POA: Diagnosis not present

## 2019-04-02 DIAGNOSIS — N189 Chronic kidney disease, unspecified: Secondary | ICD-10-CM

## 2019-04-02 LAB — CBC
Hematocrit: 30.9 % — ABNORMAL LOW (ref 34.0–46.6)
Hemoglobin: 9.5 g/dL — ABNORMAL LOW (ref 11.1–15.9)
MCH: 26.8 pg (ref 26.6–33.0)
MCHC: 30.7 g/dL — ABNORMAL LOW (ref 31.5–35.7)
MCV: 87 fL (ref 79–97)
Platelets: 236 10*3/uL (ref 150–450)
RBC: 3.54 x10E6/uL — ABNORMAL LOW (ref 3.77–5.28)
RDW: 13.7 % (ref 11.7–15.4)
WBC: 7.4 10*3/uL (ref 3.4–10.8)

## 2019-04-02 LAB — BASIC METABOLIC PANEL
BUN/Creatinine Ratio: 22 (ref 12–28)
BUN: 32 mg/dL — ABNORMAL HIGH (ref 8–27)
CO2: 24 mmol/L (ref 20–29)
Calcium: 9.5 mg/dL (ref 8.7–10.3)
Chloride: 104 mmol/L (ref 96–106)
Creatinine, Ser: 1.45 mg/dL — ABNORMAL HIGH (ref 0.57–1.00)
GFR calc Af Amer: 42 mL/min/{1.73_m2} — ABNORMAL LOW (ref >59)
GFR calc non Af Amer: 36 mL/min/{1.73_m2} — ABNORMAL LOW (ref >59)
Glucose: 134 mg/dL — ABNORMAL HIGH (ref 65–99)
Potassium: 3.8 mmol/L (ref 3.5–5.2)
Sodium: 140 mmol/L (ref 134–144)

## 2019-04-02 NOTE — Patient Instructions (Addendum)
After Visit Summary:  We will be checking the following labs today - BMET & CBC   Medication Instructions:    Continue with your current medicines.    If you need a refill on your cardiac medications before your next appointment, please call your pharmacy.     Testing/Procedures To Be Arranged:  N/A  Follow-Up:   See Dr. Tamala Julian in about 3 months    At [facility], you and your health needs are our priority.  As part of our continuing mission to provide you with exceptional heart care, we have created designated Provider Care Teams.  These Care Teams include your primary Cardiologist (physician) and Advanced Practice Providers (APPs -  Physician Assistants and Nurse Practitioners) who all work together to provide you with the care you need, when you need it.  Special Instructions:  . Stay safe, stay home, wash your hands for at least 20 seconds and wear a mask when out in public.  . It was good to talk with you today.    Call the [place] office at [phone] if you have any questions, problems or concerns.

## 2019-04-02 NOTE — Telephone Encounter (Signed)
New message     FYI Called [place] [facility] re[facility] to check on status of referral.  Referral is in the correct workqueue.  Neuro rehab is behind on scheduling appointments due to [place].  They will call pt as soon as they can to get her scheduled. Nurse notified.

## 2019-04-12 DIAGNOSIS — U071 COVID-19: Secondary | ICD-10-CM

## 2019-04-12 HISTORY — DX: COVID-19: U07.1

## 2019-04-15 ENCOUNTER — Other Ambulatory Visit: Payer: Self-pay

## 2019-04-15 ENCOUNTER — Ambulatory Visit: Payer: Medicare Other | Attending: Cardiothoracic Surgery | Admitting: Physical Therapy

## 2019-04-15 DIAGNOSIS — R2681 Unsteadiness on feet: Secondary | ICD-10-CM | POA: Diagnosis not present

## 2019-04-15 DIAGNOSIS — M6281 Muscle weakness (generalized): Secondary | ICD-10-CM

## 2019-04-15 DIAGNOSIS — R262 Difficulty in walking, not elsewhere classified: Secondary | ICD-10-CM | POA: Diagnosis not present

## 2019-04-15 NOTE — Therapy (Signed)
[facility] [address] [place], [place], [address] Phone: [phone]   Fax:  [phone]  Physical Therapy Evaluation  Patient Details  Name: [name] MRN: [id number] Date of Birth: 1946-01-09 Referring Provider (PT): Burtis Junes, NP   Encounter Date: 04/15/2019  PT End of Session - 04/15/19 1752    Visit Number  1    Number of Visits  17    Date for PT Re-Evaluation  06/14/19    Authorization Type  [employer]; $30 copay.  VL: MN.  10th visit PN    PT Start Time  1706    PT Stop Time  1748    PT Time Calculation (min)  42 min    Activity Tolerance  Patient tolerated treatment well    Behavior During Therapy  Flat affect       Past Medical History:  Diagnosis Date  . Allergy   . Asthma   . Blood transfusion without reported diagnosis 12/23/2004   with colon cancer  . Brain tumor (benign) ([place])   . Colon cancer [facility]) Nov. 27,  2006  . Diabetes mellitus    Type 2  . Female bladder prolapse   . Gastritis   . GERD (gastroesophageal reflux disease)   . Glaucoma   . Hypercholesterolemia   . Hypertension     Past Surgical History:  Procedure Laterality Date  . ABDOMINAL HYSTERECTOMY  1976  . BALLOON DILATION N/A 08/23/2017   Procedure: BALLOON DILATION;  Surgeon: Milus Banister, MD;  Location: [facility];  Service: Endoscopy;  Laterality: N/A;  Esophageal Diltation  . bladder tac    . CARDIAC CATHETERIZATION  2005  . CATARACT EXTRACTION, BILATERAL    . COLONOSCOPY W/ BIOPSIES    . CORONARY STENT INTERVENTION N/A 01/17/2019   Procedure: CORONARY STENT INTERVENTION;  Surgeon: Nelva Bush, MD;  Location: [facility];  Service: Cardiovascular;  Laterality: N/A;  . ESOPHAGEAL MANOMETRY N/A 03/20/2015   Procedure: ESOPHAGEAL MANOMETRY (EM);  Surgeon: Gatha Mayer, MD;  Location: [facility];  Service: Endoscopy;  Laterality: N/A;  . ESOPHAGOGASTRODUODENOSCOPY (EGD) WITH PROPOFOL N/A  08/23/2017   Procedure: ESOPHAGOGASTRODUODENOSCOPY (EGD) WITH PROPOFOL;  Surgeon: Milus Banister, MD;  Location: [facility];  Service: Endoscopy;  Laterality: N/A;  . EYE SURGERY     mole removed left eye  . INTRAVASCULAR PRESSURE WIRE/FFR STUDY N/A 01/17/2019   Procedure: INTRAVASCULAR PRESSURE WIRE/FFR STUDY;  Surgeon: Nelva Bush, MD;  Location: [facility];  Service: Cardiovascular;  Laterality: N/A;  . LAPAROSCOPIC NISSEN FUNDOPLICATION  1517  . LEFT HEART CATH AND CORONARY ANGIOGRAPHY N/A 04/17/2018   Procedure: LEFT HEART CATH AND CORONARY ANGIOGRAPHY;  Surgeon: Belva Crome, MD;  Location: [facility];  Service: Cardiovascular;  Laterality: N/A;  . LEFT HEART CATH AND CORONARY ANGIOGRAPHY N/A 01/17/2019   Procedure: LEFT HEART CATH AND CORONARY ANGIOGRAPHY;  Surgeon: Nelva Bush, MD;  Location: [facility];  Service: Cardiovascular;  Laterality: N/A;  . repair prolapsedbladder    . RIGHT COLECTOMY  Nov. 27, 2006  . TMJ ARTHROPLASTY    . UPPER GASTROINTESTINAL ENDOSCOPY      There were no vitals filed for this visit.   Subjective Assessment - 04/15/19 1710    Subjective  Pt reports feeling dizziness with bending over and feels like she staggers when she walks.  Pt has a history of vertigo and has been treated with physical therapy but didn't see much  improvement with therapy.  Feels like recently she is losing more control of her balance.    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, asthma, benign meningioma, colon cancer, DM, glaucoma, HTN, hypercholesterolemia      Balance Screen   Has the patient fallen in the past 6 months  No      [place] residence    Living Arrangements  Spouse/significant other    Type of [place]  One level    [place] - 2 wheels;[place] - single point    Additional Comments  Not driving; husband is visually impaired; granddaughter assists with cleaning      Prior Function   Level of Independence  Independent      Observation/Other Assessments   Focus on Therapeutic Outcomes (FOTO)   N/A      Sensation   Light Touch  Appears Intact      Coordination   Gross Motor Movements are Fluid and Coordinated  No    Finger Nose Finger Test  delayed but good trajectory    Heel Shin Test  WFL      ROM / Strength   AROM / PROM / Strength  Strength      Strength   Overall Strength  Deficits    Overall Strength Comments  LLE: 5/5 except hip flexion 4-/5.  RLE: 4-/5 overall      Ambulation/Gait   Ambulation/Gait  Yes    Ambulation/Gait Assistance  6: Modified independent (Device/Increase time)    Ambulation Distance (Feet)  115 Feet    Assistive device  None    Gait Pattern  Step-through pattern;Decreased step length - right;Decreased step length - left;Decreased stride length;Decreased trunk rotation;Narrow base of support    Ambulation Surface  Level;Indoor           Vestibular Assessment - 04/15/19 1719      Vestibular Assessment   General Observation  Moves slowly and cautiously      Symptom Behavior   Subjective history of current problem  Denies nausea and vomiting, headaches, reports changes in vision - black spots in vision but has not been to eye doctor recently; denies  changes in hearing, denies tinnitus    Type of Dizziness   Imbalance    Frequency of Dizziness  daily    Duration of Dizziness  brief    Symptom Nature  Motion provoked    Aggravating Factors  Forward bending;Comment   staggering with walking   Relieving Factors  Comments   avoiding bending forwards and activity in general   Progression of Symptoms  Worse  Oculomotor Exam   Oculomotor Alignment  Normal    Spontaneous  Absent    Gaze-induced   Absent    Smooth Pursuits  Saccades   difficulty maintaining gaze, loses finger   Saccades  Poor trajectory;Slow    Comment  Negative test of skew, no diplopia.  Impaired peripheral vision especially in lower visual fields      Oculomotor Exam-Fixation Suppressed    Left Head Impulse  negative    Right Head Impulse  negative      Vestibulo-Ocular Reflex   VOR to Slow Head Movement  Comment   difficulty maintaining gaze   VOR Cancellation  Normal    Comment  no dizziness with any of these head movements      Other Tests   Comments  MCTSIB: 30 seconds for condition 1 and 2 with very minimal sway; with foam: condition 3 required closer supervision x 30 seconds.  3 seconds for condition 4      Positional Testing   Sidelying Test  Sidelying Right;Sidelying Left      Sidelying Right   Sidelying Right Duration  0    Sidelying Right Symptoms  No nystagmus      Sidelying Left   Sidelying Left Duration  0    Sidelying Left Symptoms  No nystagmus          Objective measurements completed on examination: See above findings.              PT Education - 04/15/19 1752    Education Details  clinical findings, PT POC and goals    Person(s) Educated  Patient    Methods  Explanation    Comprehension  Verbalized understanding       PT Short Term Goals - 04/16/19 2104      PT SHORT TERM GOAL #1   Title  Pt will participate in further assessment of balance with BERG balance test.    Time  4    Period  Weeks    Status   New    Target Date  05/15/19      PT SHORT TERM GOAL #2   Title  Pt will demonstrate ability to safely perform standing balance and LE strengthening HEP with UE support    Time  4    Period  Weeks    Status  New    Target Date  05/15/19      PT SHORT TERM GOAL #3   Title  Pt will demonstrate improved use of balance reactions as indicated by ability to perform condition 3 on MCTSIB with minimal sway and improve condition 4 to 10 seconds    Baseline  3 seconds for condition 4; moderate sway on condition 3    Time  4    Period  Weeks    Status  New    Target Date  05/15/19      PT SHORT TERM GOAL #4   Title  Pt will negotiate 8 stairs with 2 rails with supervision    Time  4    Period  Weeks    Status  New    Target Date  05/15/19      PT SHORT TERM GOAL #5   Title  Pt will demonstrate ability to utilize visual compensation to negotiate obstacles and safely reach down to floor for objects with min A    Time  4    Period  Weeks    Status  New  Target Date  05/15/19        PT Long Term Goals - 04/16/19 2109      PT LONG TERM GOAL #1   Title  Pt will demonstrate ability to safely perform final HEP    Time  8    Period  Weeks    Status  New    Target Date  06/14/19      PT LONG TERM GOAL #2   Title  Pt will improve use of vestibular system to perform condition 4 on MCTSIB x 20 seconds    Time  8    Period  Weeks    Status  New    Target Date  06/14/19      PT LONG TERM GOAL #3   Title  Pt will decrease falls risk as indicated by increase in BERG score by 4 points    Baseline  TBD    Time  8    Period  Weeks    Status  New    Target Date  06/14/19      PT LONG TERM GOAL #4   Title  Pt will ambulate x 230' over indoor surfaces while negotiating safely over and around obstacles and will safely reach down to retrieve items with supervision    Time  8    Period  Weeks    Status  New    Target Date  06/14/19      PT LONG TERM GOAL #5   Title  Pt will negotiate  12 stairs with 2 rails with supervision and will negotiate curb with min A    Time  8    Period  Weeks    Status  New    Target Date  06/14/19             Plan - 04/16/19 2054    Clinical Impression Statement  Pt is a 74 year old female referred to Neuro OPPT for evaluation of dizziness and imbalance.  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 The following deficits were noted during pt's exam: dizziness/disequilibrium - pt does not appear to have true vertigo based on results of positional testing (negative for spinning/vertigo or nystagmus), impaired LE strength with R > L weakness, impaired balance, impaired gait and impaired vision especially in lower visual fields placing patient at increased risk for falls.  Pt would benefit from skilled PT to address these impairments and functional limitations to maximize functional mobility independence and reduce falls risk.    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, asthma, benign meningioma, colon cancer, DM, glaucoma, HTN, hypercholesterolemia    Examination-Activity Limitations  Bend;Locomotion Level;Stairs    Examination-Participation Restrictions  Cleaning;Laundry;Meal Prep    Stability/Clinical Decision Making  Evolving/Moderate complexity    Clinical Decision Making  Moderate    Rehab Potential  Good    PT Frequency  2x / week    PT Duration  8 weeks    PT Treatment/Interventions  ADLs/Self Care Home Management;Canalith Repostioning;Gait training;DME Instruction;Stair training;Functional mobility training;Therapeutic activities;Therapeutic exercise;Balance training;Neuromuscular re-education;Patient/family education;Vestibular    PT Next Visit Plan  Recent cardiac stenting; CAD - check  vitals each visit!  Pt has glaucoma and visual field deficits, especially lower visual field - need to work on compensatory strategies for obstacles, stairs.  Assess  stairs and BERG.  Initiate strengthening and balance HEP.    Recommended Other Services  Have sent a message to primary care about referring to ophthalmology due to worsening of vision    Consulted and Agree with Plan of Care  Patient       Patient will benefit from skilled therapeutic intervention in order to improve the following deficits and impairments:  Cardiopulmonary status limiting activity, Decreased balance, Decreased strength, Dizziness, Difficulty walking, Impaired vision/preception  Visit Diagnosis: Unsteadiness on feet  Muscle weakness (generalized)  Difficulty in walking, not elsewhere classified     Problem List Patient Active Problem List   Diagnosis Date Noted  . S/P drug eluting coronary stent placement   . Acute anemia   . Unstable angina ([place]) 01/15/2019  . Arthritis of finger of right hand 05/24/2018  . Stable angina ([place]) 04/12/2018  . Pain in both feet 12/14/2017  . Cough 09/29/2017  . Benign esophageal stricture   . Health care maintenance 03/14/2016  . Memory difficulties 07/20/2015  . Dysphagia 11/17/2014  . Bradycardia 12/08/2013  . Urine incontinence 03/07/2012  . Itching 01/09/2012  . Hematuria - cause not known 01/09/2012  . VERTIGO, CHRONIC 06/16/2009  . DM (diabetes mellitus), type 2, uncontrolled ([place]) 03/16/2007  . MENINGIOMA 02/07/2007  . HYPERTRIGLYCERIDEMIA 07/14/2006  . HYPERTENSION, BENIGN ESSENTIAL 07/14/2006  . Allergic rhinitis 07/14/2006  . History of colon cancer 06/08/2006  . ANEMIA, IRON DEFICIENCY, UNSPEC. 06/08/2006  . DEPRESSIVE DISORDER, NOS 06/08/2006  . CAD (coronary artery disease) 06/08/2006  . GASTROESOPHAGEAL REFLUX, NO ESOPHAGITIS 06/08/2006  . FIBROADENOSIS, BREAST 06/08/2006  . EXTERNAL HEMORRHOIDS 10/23/2001  . COLONIC POLYPS, ADENOMATOUS,  BENIGN 10/23/2001    Rico Junker, PT, DPT 04/16/19    9:22 PM    [facility] [address] [place], [place], [address] Phone: [phone]   Fax:  [phone]  Name: [name] MRN: [id number] Date of Birth: 08-27-1945

## 2019-04-16 ENCOUNTER — Telehealth: Payer: Self-pay | Admitting: Physical Therapy

## 2019-04-16 NOTE — Telephone Encounter (Signed)
Hello Dr. Pilar Plate, [name] was evaluated yesterday for dizziness and vestibular rehab.  She was negative for positional vertigo but we are going to see her for follow up visits to address her balance impairments and physical deconditioning.    I believe her vision is also playing a role in her imbalance and falls risk.    She reports having black spots in her vision and when I screened her peripheral vision she had increased difficulty seeing objects in her superior and inferior visual fields.      I saw glaucoma in her medical history but she states she has not seen an ophthalmologist in years and doesn't currently have an ophthalmologist to follow up with.    If you feel like it is appropraite, would it be possible to place a referral for [facility] to ophthalmology?    Please let me know if you have any thoughts about this.  Thanks, Rico Junker, PT, DPT 04/16/19    9:51 PM

## 2019-04-17 ENCOUNTER — Other Ambulatory Visit: Payer: Self-pay | Admitting: Family Medicine

## 2019-04-17 DIAGNOSIS — H409 Unspecified glaucoma: Secondary | ICD-10-CM

## 2019-04-17 NOTE — Telephone Encounter (Signed)
Thank you for noticing that.  I have placed a referral to ophthalmology.   [name], MD

## 2019-04-23 ENCOUNTER — Other Ambulatory Visit: Payer: Self-pay | Admitting: Interventional Cardiology

## 2019-04-23 ENCOUNTER — Other Ambulatory Visit: Payer: Self-pay | Admitting: *Deleted

## 2019-04-23 DIAGNOSIS — I2510 Atherosclerotic heart disease of native coronary artery without angina pectoris: Secondary | ICD-10-CM | POA: Diagnosis not present

## 2019-04-23 DIAGNOSIS — D631 Anemia in chronic kidney disease: Secondary | ICD-10-CM | POA: Diagnosis not present

## 2019-04-23 DIAGNOSIS — N189 Chronic kidney disease, unspecified: Secondary | ICD-10-CM | POA: Diagnosis not present

## 2019-04-23 DIAGNOSIS — N390 Urinary tract infection, site not specified: Secondary | ICD-10-CM | POA: Diagnosis not present

## 2019-04-23 DIAGNOSIS — I129 Hypertensive chronic kidney disease with stage 1 through stage 4 chronic kidney disease, or unspecified chronic kidney disease: Secondary | ICD-10-CM | POA: Diagnosis not present

## 2019-04-23 DIAGNOSIS — N1832 Chronic kidney disease, stage 3b: Secondary | ICD-10-CM | POA: Diagnosis not present

## 2019-04-23 MED ORDER — AMLODIPINE BESYLATE 5 MG PO TABS: 5.0000 mg | ORAL_TABLET | Freq: Every day | ORAL | 3 refills | Status: DC

## 2019-04-29 ENCOUNTER — Other Ambulatory Visit: Payer: Self-pay | Admitting: Interventional Cardiology

## 2019-04-29 MED ORDER — QUINAPRIL-HYDROCHLOROTHIAZIDE 20-12.5 MG PO TABS: 1.0000 | ORAL_TABLET | Freq: Every day | ORAL | 3 refills | Status: DC

## 2019-05-02 ENCOUNTER — Ambulatory Visit: Payer: Medicare Other | Attending: Internal Medicine

## 2019-05-02 DIAGNOSIS — Z20822 Contact with and (suspected) exposure to covid-19: Secondary | ICD-10-CM | POA: Diagnosis not present

## 2019-05-03 ENCOUNTER — Telehealth: Payer: Self-pay | Admitting: Physician Assistant

## 2019-05-03 ENCOUNTER — Telehealth: Payer: Self-pay

## 2019-05-03 ENCOUNTER — Other Ambulatory Visit: Payer: Self-pay | Admitting: Physician Assistant

## 2019-05-03 DIAGNOSIS — I10 Essential (primary) hypertension: Secondary | ICD-10-CM

## 2019-05-03 DIAGNOSIS — R54 Age-related physical debility: Secondary | ICD-10-CM

## 2019-05-03 DIAGNOSIS — U071 COVID-19: Secondary | ICD-10-CM

## 2019-05-03 DIAGNOSIS — I2510 Atherosclerotic heart disease of native coronary artery without angina pectoris: Secondary | ICD-10-CM

## 2019-05-03 LAB — NOVEL CORONAVIRUS, NAA: SARS-CoV-2, NAA: DETECTED — AB

## 2019-05-03 NOTE — Telephone Encounter (Signed)
[name] was called today.  She reported that she initially decided to get Covid tested because she knew she had an exposure with a family member.  She reports that she currently has symptoms of a runny nose and a sore throat which started 1 day ago.  She denies any respiratory distress.  She was tested on 1/21 and found to be Covid positive.  She is at a high risk for hospitalization due to age and comorbidities which include diabetes, heart failure and previous history of colon cancer.  She is encouraged to isolate at home for 10 days from symptom onset which would bring her through 1/31.  She was informed that she is a candidate for monoclonal antibody infusion to prevent hospitalization.  She noted that she was interested in receiving this treatment.  She was encouraged to isolate into call the family medicine center or presents to the ED for respiratory distress.  Following our conversation, the infusion center was called and a detailed message left stating that [name] is at a high risk for hospitalization due to COVID-19 infection and would benefit from monoclonal antibody infusion.  [name], MD

## 2019-05-03 NOTE — Telephone Encounter (Signed)
Pt calls nurse line to inform doctor of positive COVID results. Pt tested positive yesterday (05/02/19). Pt declines shortness of breath, chest pain, and fever.   Offered patient virtual visit to discuss symptoms and management with provider. Pt scheduled virtual visit for Monday morning.   ER precautions given  Talbot Grumbling, RN

## 2019-05-03 NOTE — Telephone Encounter (Signed)
  I connected by phone with [name] on 05/03/2019 at 3:53 PM to discuss the potential use of an new treatment for mild to moderate COVID-19 viral infection in non-hospitalized patients.  This patient is a 74 y.o. female that meets the FDA criteria for Emergency Use Authorization of bamlanivimab or casirivimab\imdevimab.  Has a (+) direct SARS-CoV-2 viral test result  Has mild or moderate COVID-19   Is ? 74 years of age and weighs ? 40 kg  Is NOT hospitalized due to COVID-19  Is NOT requiring oxygen therapy or requiring an increase in baseline oxygen flow rate due to COVID-19  Is within 10 days of symptom onset  Has at least one of the high risk factor(s) for progression to severe COVID-19 and/or hospitalization as defined in EUA.  Specific high risk criteria : >/= 73 yo, HTN, CHF, CAD    I have spoken and communicated the following to the patient or parent/caregiver:  1. FDA has authorized the emergency use of bamlanivimab and casirivimab\imdevimab for the treatment of mild to moderate COVID-19 in adults and pediatric patients with positive results of direct SARS-CoV-2 viral testing who are 33 years of age and older weighing at least 40 kg, and who are at high risk for progressing to severe COVID-19 and/or hospitalization.  2. The significant known and potential risks and benefits of bamlanivimab and casirivimab\imdevimab, and the extent to which such potential risks and benefits are unknown.  3. Information on available alternative treatments and the risks and benefits of those alternatives, including clinical trials.  4. Patients treated with bamlanivimab and casirivimab\imdevimab should continue to self-isolate and use infection control measures (e.g., wear mask, isolate, social distance, avoid sharing personal items, clean and disinfect "high touch" surfaces, and frequent handwashing) according to CDC guidelines.   5. The patient or parent/caregiver has the option to accept  or refuse bamlanivimab or casirivimab\imdevimab .  After reviewing this information with the patient, The patient agreed to proceed with receiving the bamlanimivab infusion and will be provided a copy of the Fact sheet prior to receiving the infusion.   Scheduled for Tuesday 05/07/19 @ 12:30pm. Directions given  [name] 05/03/2019 3:53 PM

## 2019-05-05 ENCOUNTER — Encounter (HOSPITAL_COMMUNITY): Payer: Self-pay

## 2019-05-05 ENCOUNTER — Emergency Department (HOSPITAL_COMMUNITY): Payer: Medicare Other

## 2019-05-05 ENCOUNTER — Emergency Department (HOSPITAL_COMMUNITY)
Admission: EM | Admit: 2019-05-05 | Discharge: 2019-05-05 | Disposition: A | Discharge status: Home / Self Care | Payer: Medicare Other | Attending: Emergency Medicine | Admitting: Emergency Medicine

## 2019-05-05 DIAGNOSIS — R197 Diarrhea, unspecified: Secondary | ICD-10-CM | POA: Diagnosis not present

## 2019-05-05 DIAGNOSIS — J45909 Unspecified asthma, uncomplicated: Secondary | ICD-10-CM | POA: Insufficient documentation

## 2019-05-05 DIAGNOSIS — I959 Hypotension, unspecified: Secondary | ICD-10-CM | POA: Diagnosis not present

## 2019-05-05 DIAGNOSIS — Z85038 Personal history of other malignant neoplasm of large intestine: Secondary | ICD-10-CM | POA: Insufficient documentation

## 2019-05-05 DIAGNOSIS — I10 Essential (primary) hypertension: Secondary | ICD-10-CM | POA: Insufficient documentation

## 2019-05-05 DIAGNOSIS — N390 Urinary tract infection, site not specified: Secondary | ICD-10-CM

## 2019-05-05 DIAGNOSIS — I25110 Atherosclerotic heart disease of native coronary artery with unstable angina pectoris: Secondary | ICD-10-CM | POA: Insufficient documentation

## 2019-05-05 DIAGNOSIS — E119 Type 2 diabetes mellitus without complications: Secondary | ICD-10-CM | POA: Insufficient documentation

## 2019-05-05 DIAGNOSIS — U071 COVID-19: Secondary | ICD-10-CM | POA: Diagnosis not present

## 2019-05-05 DIAGNOSIS — Z794 Long term (current) use of insulin: Secondary | ICD-10-CM | POA: Insufficient documentation

## 2019-05-05 DIAGNOSIS — Z743 Need for continuous supervision: Secondary | ICD-10-CM | POA: Diagnosis not present

## 2019-05-05 DIAGNOSIS — Z7982 Long term (current) use of aspirin: Secondary | ICD-10-CM | POA: Insufficient documentation

## 2019-05-05 DIAGNOSIS — E860 Dehydration: Secondary | ICD-10-CM | POA: Diagnosis not present

## 2019-05-05 DIAGNOSIS — R531 Weakness: Secondary | ICD-10-CM | POA: Diagnosis not present

## 2019-05-05 DIAGNOSIS — E1165 Type 2 diabetes mellitus with hyperglycemia: Secondary | ICD-10-CM | POA: Diagnosis not present

## 2019-05-05 DIAGNOSIS — Z7902 Long term (current) use of antithrombotics/antiplatelets: Secondary | ICD-10-CM | POA: Diagnosis not present

## 2019-05-05 DIAGNOSIS — Z20822 Contact with and (suspected) exposure to covid-19: Secondary | ICD-10-CM | POA: Diagnosis not present

## 2019-05-05 LAB — URINALYSIS, ROUTINE W REFLEX MICROSCOPIC
Bilirubin Urine: NEGATIVE
Glucose, UA: >=500 mg/dL — AB
Ketones, ur: NEGATIVE mg/dL
Nitrite: NEGATIVE
Protein, ur: 30 mg/dL — AB
Specific Gravity, Urine: 1.016 (ref 1.005–1.030)
pH: 5 (ref 5.0–8.0)

## 2019-05-05 LAB — COMPREHENSIVE METABOLIC PANEL
ALT: 11 U/L (ref 0–44)
AST: 21 U/L (ref 15–41)
Albumin: 3.4 g/dL — ABNORMAL LOW (ref 3.5–5.0)
Alkaline Phosphatase: 56 U/L (ref 38–126)
Anion gap: 12 (ref 5–15)
BUN: 44 mg/dL — ABNORMAL HIGH (ref 8–23)
CO2: 26 mmol/L (ref 22–32)
Calcium: 8.8 mg/dL — ABNORMAL LOW (ref 8.9–10.3)
Chloride: 98 mmol/L (ref 98–111)
Creatinine, Ser: 2.21 mg/dL — ABNORMAL HIGH (ref 0.44–1.00)
GFR calc Af Amer: 25 mL/min — ABNORMAL LOW (ref >60)
GFR calc non Af Amer: 22 mL/min — ABNORMAL LOW (ref >60)
Glucose, Bld: 248 mg/dL — ABNORMAL HIGH (ref 70–99)
Potassium: 3.7 mmol/L (ref 3.5–5.1)
Sodium: 136 mmol/L (ref 135–145)
Total Bilirubin: 1.1 mg/dL (ref 0.3–1.2)
Total Protein: 8 g/dL (ref 6.5–8.1)

## 2019-05-05 LAB — CBC WITH DIFFERENTIAL/PLATELET
Abs Immature Granulocytes: 0.1 10*3/uL — ABNORMAL HIGH (ref 0.00–0.07)
Basophils Absolute: 0 10*3/uL (ref 0.0–0.1)
Basophils Relative: 0 %
Eosinophils Absolute: 0 10*3/uL (ref 0.0–0.5)
Eosinophils Relative: 0 %
HCT: 28 % — ABNORMAL LOW (ref 36.0–46.0)
Hemoglobin: 8.8 g/dL — ABNORMAL LOW (ref 12.0–15.0)
Immature Granulocytes: 2 %
Lymphocytes Relative: 18 %
Lymphs Abs: 1.2 10*3/uL (ref 0.7–4.0)
MCH: 27.3 pg (ref 26.0–34.0)
MCHC: 31.4 g/dL (ref 30.0–36.0)
MCV: 87 fL (ref 80.0–100.0)
Monocytes Absolute: 0.5 10*3/uL (ref 0.1–1.0)
Monocytes Relative: 8 %
Neutro Abs: 4.8 10*3/uL (ref 1.7–7.7)
Neutrophils Relative %: 72 %
Platelets: 241 10*3/uL (ref 150–400)
RBC: 3.22 MIL/uL — ABNORMAL LOW (ref 3.87–5.11)
RDW: 13.2 % (ref 11.5–15.5)
WBC: 6.6 10*3/uL (ref 4.0–10.5)
nRBC: 0 % (ref 0.0–0.2)

## 2019-05-05 LAB — LIPASE, BLOOD: Lipase: 41 U/L (ref 11–51)

## 2019-05-05 LAB — POC OCCULT BLOOD, ED: Fecal Occult Bld: NEGATIVE

## 2019-05-05 MED ORDER — SODIUM CHLORIDE 0.9 % IV BOLUS: 500.0000 mL | Freq: Once | INTRAVENOUS | Status: DC

## 2019-05-05 MED ORDER — CEPHALEXIN 500 MG PO CAPS: 500.0000 mg | ORAL_CAPSULE | Freq: Two times a day (BID) | ORAL | 0 refills | Status: AC

## 2019-05-05 MED ORDER — SODIUM CHLORIDE 0.9 % IV BOLUS
1000.0000 mL | Freq: Once | INTRAVENOUS | Status: AC
  Administered 2019-05-05: 1000 mL via INTRAVENOUS

## 2019-05-05 NOTE — Discharge Instructions (Addendum)
Take antibiotic for possible urinary tract infection.  Follow-up with your primary care doctor.  Return to the ED if symptoms worsen.

## 2019-05-05 NOTE — ED Triage Notes (Signed)
Pt BIBA from home. Pt dx with COVID 3 days ago. Pt is due for remdesivir infusion tomorrow. Family noticed pt was weak, then started experiencing dark diarrhea stool  X 2 days. Diarrhea is "uncontrollable" with family.  Pt is on Plavix.

## 2019-05-05 NOTE — ED Provider Notes (Signed)
[name] DEPT Provider Note   CSN: [id number] Arrival date & time: 05/05/19  1514     History Chief Complaint  Patient presents with  . Diarrhea    [name] is a 74 y.o. female.  The history is provided by the patient.  Diarrhea Quality:  Watery Severity:  Moderate Onset quality:  Gradual Duration:  2 days Timing:  Constant Progression:  Unchanged Relieved by:  Nothing Worsened by:  Nothing Associated symptoms: no abdominal pain, no arthralgias, no chills, no recent cough, no diaphoresis, no fever, no headaches, no myalgias, no URI and no vomiting   Risk factors comment:  Positive for covid 3 days ago.       Past Medical History:  Diagnosis Date  . Allergy   . Asthma   . Blood transfusion without reported diagnosis 12/23/2004   with colon cancer  . Brain tumor (benign) ([place])   . Colon cancer [facility]) Nov. 27,  2006  . Diabetes mellitus    Type 2  . Female bladder prolapse   . Gastritis   . GERD (gastroesophageal reflux disease)   . Glaucoma   . Hypercholesterolemia   . Hypertension     Patient Active Problem List   Diagnosis Date Noted  . S/P drug eluting coronary stent placement   . Acute anemia   . Unstable angina ([place]) 01/15/2019  . Arthritis of finger of right hand 05/24/2018  . Stable angina ([place]) 04/12/2018  . Pain in both feet 12/14/2017  . Cough 09/29/2017  . Benign esophageal stricture   . Health care maintenance 03/14/2016  . Memory difficulties 07/20/2015  . Dysphagia 11/17/2014  . Bradycardia 12/08/2013  . Urine incontinence 03/07/2012  . Itching 01/09/2012  . Hematuria - cause not known 01/09/2012  . VERTIGO, CHRONIC 06/16/2009  . DM (diabetes mellitus), type 2, uncontrolled ([place]) 03/16/2007  . MENINGIOMA 02/07/2007  . HYPERTRIGLYCERIDEMIA 07/14/2006  . HYPERTENSION, BENIGN ESSENTIAL 07/14/2006  . Allergic rhinitis 07/14/2006  . History of colon cancer 06/08/2006  . ANEMIA, IRON DEFICIENCY,  UNSPEC. 06/08/2006  . DEPRESSIVE DISORDER, NOS 06/08/2006  . CAD (coronary artery disease) 06/08/2006  . GASTROESOPHAGEAL REFLUX, NO ESOPHAGITIS 06/08/2006  . FIBROADENOSIS, BREAST 06/08/2006  . EXTERNAL HEMORRHOIDS 10/23/2001  . COLONIC POLYPS, ADENOMATOUS, BENIGN 10/23/2001    Past Surgical History:  Procedure Laterality Date  . ABDOMINAL HYSTERECTOMY  1976  . BALLOON DILATION N/A 08/23/2017   Procedure: BALLOON DILATION;  Surgeon: Milus Banister, MD;  Location: [facility];  Service: Endoscopy;  Laterality: N/A;  Esophageal Diltation  . bladder tac    . CARDIAC CATHETERIZATION  2005  . CATARACT EXTRACTION, BILATERAL    . COLONOSCOPY W/ BIOPSIES    . CORONARY STENT INTERVENTION N/A 01/17/2019   Procedure: CORONARY STENT INTERVENTION;  Surgeon: Nelva Bush, MD;  Location: [place] [facility];  Service: Cardiovascular;  Laterality: N/A;  . ESOPHAGEAL MANOMETRY N/A 03/20/2015   Procedure: ESOPHAGEAL MANOMETRY (EM);  Surgeon: Gatha Mayer, MD;  Location: [facility];  Service: Endoscopy;  Laterality: N/A;  . ESOPHAGOGASTRODUODENOSCOPY (EGD) WITH PROPOFOL N/A 08/23/2017   Procedure: ESOPHAGOGASTRODUODENOSCOPY (EGD) WITH PROPOFOL;  Surgeon: Milus Banister, MD;  Location: [facility];  Service: Endoscopy;  Laterality: N/A;  . EYE SURGERY     mole removed left eye  . INTRAVASCULAR PRESSURE WIRE/FFR STUDY N/A 01/17/2019   Procedure: INTRAVASCULAR PRESSURE WIRE/FFR STUDY;  Surgeon: Nelva Bush, MD;  Location: [place] [facility];  Service: Cardiovascular;  Laterality: N/A;  . LAPAROSCOPIC NISSEN  FUNDOPLICATION  [id number]  . LEFT HEART CATH AND CORONARY ANGIOGRAPHY N/A 04/17/2018   Procedure: LEFT HEART CATH AND CORONARY ANGIOGRAPHY;  Surgeon: Belva Crome, MD;  Location: [place] [facility];  Service: Cardiovascular;  Laterality: N/A;  . LEFT HEART CATH AND CORONARY ANGIOGRAPHY N/A 01/17/2019   Procedure: LEFT HEART CATH AND CORONARY ANGIOGRAPHY;  Surgeon: Nelva Bush, MD;   Location: [place] [facility];  Service: Cardiovascular;  Laterality: N/A;  . repair prolapsedbladder    . RIGHT COLECTOMY  Nov. 27, 2006  . TMJ ARTHROPLASTY    . UPPER GASTROINTESTINAL ENDOSCOPY       OB History   No obstetric history on file.     Family History  Problem Relation Age of Onset  . Lymphoma Other   . Bone cancer Father   . Diabetes Daughter   . Hypertension Daughter   . Heart attack Daughter   . Suicidality Son        deceased  . Colon cancer Neg Hx   . Stomach cancer Neg Hx     Social History   Tobacco Use  . Smoking status: Never Smoker  . Smokeless tobacco: Never Used  Substance Use Topics  . Alcohol use: No  . Drug use: No    Home Medications Prior to Admission medications   Medication Sig Start Date End Date Taking? Authorizing Provider  amLODipine (NORVASC) 5 MG tablet Take 1 tablet (5 mg total) by mouth daily. 04/23/19   Belva Crome, MD  aspirin 81 MG chewable tablet Chew 81 mg by mouth daily.      [provider]  Blood Glucose Monitoring Suppl (ONETOUCH VERIO) w/Device KIT 1 each by Does not apply route daily. 07/05/17   Mayo, Pete Pelt, MD  clopidogrel (PLAVIX) 75 MG tablet Take 1 tablet (75 mg total) by mouth daily. 10/30/18   Belva Crome, MD  diclofenac sodium (VOLTAREN) 1 % GEL Apply 2 g topically as needed.    [provider]  empagliflozin (JARDIANCE) 10 MG TABS tablet Take 10 mg by mouth daily. 03/26/19   [name], MD  glucose blood [facility] VERIO) test strip Use as instructed 03/11/19   [name], MD  Insulin Glargine (LANTUS SOLOSTAR) 100 UNIT/ML Solostar Pen Inject 15 Units into the skin daily at 10 pm. 01/10/19   [name], MD  Insulin Pen Needle 31G X 5 MM MISC Check blood sugar once daily 08/08/17   Mayo, Pete Pelt, MD  isosorbide mononitrate (IMDUR) 60 MG 24 hr tablet Take 1 tablet (60 mg total) by mouth daily. 02/26/19 05/27/19  Burtis Junes, NP  Lancet Devices (ONE TOUCH DELICA LANCING DEV) MISC  Check blood sugar once daily 07/05/17   Mayo, Pete Pelt, MD  nitroGLYCERIN (NITROSTAT) 0.4 MG SL tablet Place 1 tablet (0.4 mg total) under the tongue every 5 (five) minutes as needed. If chest pain not relieved by third tab call 911. 05/24/18   [name], MD  quinapril-hydrochlorothiazide (ACCURETIC) 20-12.5 MG tablet Take 1 tablet by mouth daily. 04/29/19   Belva Crome, MD  rosuvastatin (CRESTOR) 20 MG tablet Take 1 tablet (20 mg total) by mouth daily. 06/15/18   [name], MD    Allergies    Metformin and related  Review of Systems   Review of Systems  Constitutional: Negative for chills, diaphoresis and fever.  HENT: Negative for ear pain and sore throat.   Eyes: Negative for pain and visual disturbance.  Respiratory: Negative for cough  and shortness of breath.   Cardiovascular: Negative for chest pain and palpitations.  Gastrointestinal: Positive for diarrhea. Negative for abdominal pain and vomiting.  Genitourinary: Negative for dysuria and hematuria.  Musculoskeletal: Negative for arthralgias, back pain and myalgias.  Skin: Negative for color change and rash.  Neurological: Negative for seizures, syncope and headaches.  All other systems reviewed and are negative.   Physical Exam Updated Vital Signs  ED Triage Vitals  Enc Vitals Group     BP 05/05/19 1524 106/69     Pulse Rate 05/05/19 1524 (!) 53     Resp 05/05/19 1524 17     Temp 05/05/19 1524 97.6 F (36.4 C)     Temp Source 05/05/19 1524 Oral     SpO2 05/05/19 1517 99 %     Weight --      Height --      Head Circumference --      Peak Flow --      Pain Score 05/05/19 1535 3     Pain Loc --      Pain Edu? --      Excl. in [place]? --     Physical Exam Vitals and nursing note reviewed.  Constitutional:      General: She is not in acute distress.    Appearance: She is well-developed. She is not ill-appearing.  HENT:     Head: Normocephalic and atraumatic.     Mouth/Throat:     Mouth: Mucous membranes are  dry.  Eyes:     Extraocular Movements: Extraocular movements intact.     Conjunctiva/sclera: Conjunctivae normal.  Cardiovascular:     Rate and Rhythm: Normal rate and regular rhythm.     Pulses: Normal pulses.     Heart sounds: Normal heart sounds. No murmur.  Pulmonary:     Effort: Pulmonary effort is normal. No respiratory distress.     Breath sounds: Normal breath sounds.  Abdominal:     General: There is no distension.     Palpations: Abdomen is soft.     Tenderness: There is no abdominal tenderness.  Genitourinary:    Rectum: Guaiac result negative (no gross melena on rectal exam).  Musculoskeletal:     Cervical back: Neck supple.  Skin:    General: Skin is warm and dry.     Capillary Refill: Capillary refill takes less than 2 seconds.  Neurological:     General: No focal deficit present.     Mental Status: She is alert.  Psychiatric:        Mood and Affect: Mood normal.     ED Results / Procedures / Treatments   Labs (all labs ordered are listed, but only abnormal results are displayed) Labs Reviewed  CBC WITH DIFFERENTIAL/PLATELET - Abnormal; Notable for the following components:      Result Value   RBC 3.22 (*)    Hemoglobin 8.8 (*)    HCT 28.0 (*)    Abs Immature Granulocytes 0.10 (*)    All other components within normal limits  COMPREHENSIVE METABOLIC PANEL - Abnormal; Notable for the following components:   Glucose, Bld 248 (*)    BUN 44 (*)    Creatinine, Ser 2.21 (*)    Calcium 8.8 (*)    Albumin 3.4 (*)    GFR calc non Af Amer 22 (*)    GFR calc Af Amer 25 (*)    All other components within normal limits  URINALYSIS, ROUTINE W REFLEX MICROSCOPIC - Abnormal; Notable  for the following components:   APPearance HAZY (*)    Glucose, UA >=500 (*)    Hgb urine dipstick MODERATE (*)    Protein, ur 30 (*)    Leukocytes,Ua SMALL (*)    Bacteria, UA FEW (*)    All other components within normal limits  URINE CULTURE  LIPASE, BLOOD  CBG MONITORING, ED    POC OCCULT BLOOD, ED    EKG EKG Interpretation  Date/Time:  Sunday May 05 2019 15:42:34 EST Ventricular Rate:  50 PR Interval:    QRS Duration: 95 QT Interval:  514 QTC Calculation: 469 R Axis:   38 Text Interpretation: Sinus rhythm Low voltage, precordial leads Abnormal R-wave progression, early transition Baseline wander in lead(s) V6 Confirmed by Lennice Sites [phone]) on 05/05/2019 3:44:56 PM   Radiology DG Chest Portable 1 View  Result Date: 05/05/2019 CLINICAL DATA:  Weakness and diarrhea. EXAM: PORTABLE CHEST 1 VIEW COMPARISON:  January 23, 2019 FINDINGS: Numerous overlying cardiac lead wires are seen. The heart size and mediastinal contours are within normal limits. Both lungs are clear. Radiopaque surgical clips are seen overlying the medial aspect of the left upper quadrant. The visualized skeletal structures are unremarkable. IMPRESSION: No active disease. Electronically Signed   By: Virgina Norfolk M.D.   On: 05/05/2019 15:59    Procedures Procedures (including critical care time)  Medications Ordered in ED Medications  sodium chloride 0.9 % bolus 1,000 mL (0 mLs Intravenous Stopped 05/05/19 1643)    ED Course  I have reviewed the triage vital signs and the nursing notes.  Pertinent labs & imaging results that were available during my care of the patient were reviewed by me and considered in my medical decision making (see chart for details).    MDM Rules/Calculators/A&P                      [name] is a 74 year old female with history of colon cancer in remission, diabetes, high cholesterol who presents to the ED with diarrhea.  Patient with normal vitals.  No fever.  Diagnosed with coronavirus 3 days ago.  Has had mostly GI symptoms with diarrhea but no nausea or vomiting.  Has been able to tolerate p.o. however family concern for dehydration.  Stool has been dark.  However on exam there is no gross melena or hematochezia.  Patient appears clinically  dehydrated.  Mucous membranes are dry.  Patient denies any respiratory symptoms.  No urinary symptoms.  Will check lab work for electrolyte abnormalities, dehydration.  Will give fluid bolus.  EKG shows sinus bradycardia.  Urinalysis overall equivocal for infection.  Will send urine culture.  Will treat with Keflex.  Kidney function mildly elevated but given fluid bolus.  Otherwise chest x-ray shows no infectious findings.  No significant electrolyte abnormality otherwise.  Patient feeling better after IV fluids.  No significant anemia.  Discharged from the ED in good condition.  Likely diarrhea in the setting of coronavirus.  Given return precautions.  Patient is set up for outpatient infusion treatment tomorrow as well.  This chart was dictated using voice recognition software.  Despite best efforts to proofread,  errors can occur which can change the documentation meaning.   [name] was evaluated in Emergency Department on 05/05/2019 for the symptoms described in the history of present illness. She was evaluated in the context of the global COVID-19 pandemic, which necessitated consideration that the patient might be at risk for infection with the  SARS-CoV-2 virus that causes COVID-19. Institutional protocols and algorithms that pertain to the evaluation of patients at risk for COVID-19 are in a state of rapid change based on information released by regulatory bodies including the CDC and federal and state organizations. These policies and algorithms were followed during the patient's care in the ED.   Final Clinical Impression(s) / ED Diagnoses Final diagnoses:  Diarrhea, unspecified type  Urinary tract infection without hematuria, site unspecified    Rx / DC Orders ED Discharge Orders    None       Lennice Sites, DO 05/05/19 1829

## 2019-05-06 ENCOUNTER — Telehealth (INDEPENDENT_AMBULATORY_CARE_PROVIDER_SITE_OTHER): Payer: Medicare Other | Admitting: Family Medicine

## 2019-05-06 ENCOUNTER — Other Ambulatory Visit: Payer: Self-pay

## 2019-05-06 DIAGNOSIS — U071 COVID-19: Secondary | ICD-10-CM | POA: Insufficient documentation

## 2019-05-06 NOTE — Assessment & Plan Note (Signed)
Tested positive on 1/21.  Scheduled for monoclonal antibody infusion tomorrow.  Seen in ED yesterday for diarrhea, given keflex for UTI, doing well with this.  Reports tolerating PO.  Will have her continue to hydrate.  No indication for hospitalization at this time, advised to go to ED if symptoms worsen, develops SOB, chest pain, or inability to tolerate PO.  She voiced understanding.  Advised if continues to be mildly symptomatic, can d/c quarantine on 1/31 per Dr. Baxter Flattery prior note as long as she is without fever >24 hrs and feeling improved.  She voiced understanding.

## 2019-05-06 NOTE — Progress Notes (Signed)
[place] Telemedicine Visit  Patient consented to have virtual visit. Method of visit: Telephone  Encounter participants: Patient: [name] - located at home Provider: Cleophas Dunker - located at [facility] Others (if applicable): none  Chief Complaint: COVID  HPI: [name] is a 74 y.o. female with hx CAD, HTN, T2DM with recent COVID-83 infection who presents to discuss the following:  Tested positive fr COVID on 1/21 Complains of not feeling well No difficulty breathing Stomach hurts, having diarrhea, went yesterday to [facility], given fluids and Keflex for UTI Doing okay with that, no pain with urination No fevers No chest pain Feeling fatigued and weak Scheduled for monoclonal antibody infusion tomorrow She is isolating at home Is tolerating eating and drinking   ROS: per HPI  Pertinent PMHx: CAD, T2DM, HTN   Exam:  Respiratory: speaking in complete sentences, no evidence of respiratory distress over the phone  Assessment/Plan:  COVID-19 virus infection Tested positive on 1/21.  Scheduled for monoclonal antibody infusion tomorrow.  Seen in ED yesterday for diarrhea, given keflex for UTI, doing well with this.  Reports tolerating PO.  Will have her continue to hydrate.  No indication for hospitalization at this time, advised to go to ED if symptoms worsen, develops SOB, chest pain, or inability to tolerate PO.  She voiced understanding.  Advised if continues to be mildly symptomatic, can d/c quarantine on 1/31 per Dr. Baxter Flattery prior note as long as she is without fever >24 hrs and feeling improved.  She voiced understanding.    Time spent during visit with patient: 6 minutes

## 2019-05-07 ENCOUNTER — Ambulatory Visit (HOSPITAL_COMMUNITY)
Admission: RE | Admit: 2019-05-07 | Discharge: 2019-05-07 | Disposition: A | Discharge status: Home / Self Care | Payer: Medicare Other | Source: Ambulatory Visit | Attending: Pulmonary Disease | Admitting: Pulmonary Disease

## 2019-05-07 DIAGNOSIS — U071 COVID-19: Secondary | ICD-10-CM | POA: Diagnosis present

## 2019-05-07 DIAGNOSIS — I2510 Atherosclerotic heart disease of native coronary artery without angina pectoris: Secondary | ICD-10-CM | POA: Diagnosis present

## 2019-05-07 DIAGNOSIS — R54 Age-related physical debility: Secondary | ICD-10-CM | POA: Insufficient documentation

## 2019-05-07 DIAGNOSIS — I10 Essential (primary) hypertension: Secondary | ICD-10-CM | POA: Diagnosis present

## 2019-05-07 DIAGNOSIS — Z23 Encounter for immunization: Secondary | ICD-10-CM | POA: Diagnosis not present

## 2019-05-07 LAB — URINE CULTURE: Culture: <10000 — AB

## 2019-05-07 MED ORDER — EPINEPHRINE 0.3 MG/0.3ML IJ SOAJ: 0.3000 mg | Freq: Once | INTRAMUSCULAR | Status: DC | PRN

## 2019-05-07 MED ORDER — FAMOTIDINE IN NACL 20-0.9 MG/50ML-% IV SOLN: 20.0000 mg | Freq: Once | INTRAVENOUS | Status: DC | PRN

## 2019-05-07 MED ORDER — SODIUM CHLORIDE 0.9 % IV SOLN
700.0000 mg | Freq: Once | INTRAVENOUS | Status: AC
  Administered 2019-05-07: 700 mg via INTRAVENOUS
  Filled 2019-05-07: qty 20

## 2019-05-07 MED ORDER — METHYLPREDNISOLONE SODIUM SUCC 125 MG IJ SOLR: 125.0000 mg | Freq: Once | INTRAMUSCULAR | Status: DC | PRN

## 2019-05-07 MED ORDER — SODIUM CHLORIDE 0.9 % IV SOLN: INTRAVENOUS | Status: DC | PRN

## 2019-05-07 MED ORDER — DIPHENHYDRAMINE HCL 50 MG/ML IJ SOLN: 50.0000 mg | Freq: Once | INTRAMUSCULAR | Status: DC | PRN

## 2019-05-07 MED ORDER — ALBUTEROL SULFATE HFA 108 (90 BASE) MCG/ACT IN AERS: 2.0000 | INHALATION_SPRAY | Freq: Once | RESPIRATORY_TRACT | Status: DC | PRN

## 2019-05-07 NOTE — Discharge Instructions (Signed)
10 Things You Can Do to Manage Your COVID-19 Symptoms at Home If you have possible or confirmed COVID-19: 1. Stay home from work and school. And stay away from other public places. If you must go out, avoid using any kind of public transportation, ridesharing, or taxis. 2. Monitor your symptoms carefully. If your symptoms get worse, call your healthcare provider immediately. 3. Get rest and stay hydrated. 4. If you have a medical appointment, call the healthcare provider ahead of time and tell them that you have or may have COVID-19. 5. For medical emergencies, call 911 and notify the dispatch personnel that you have or may have COVID-19. 6. Cover your cough and sneezes with a tissue or use the inside of your elbow. 7. Wash your hands often with soap and water for at least 20 seconds or clean your hands with an alcohol-based hand sanitizer that contains at least 60% alcohol. 8. As much as possible, stay in a specific room and away from other people in your home. Also, you should use a separate bathroom, if available. If you need to be around other people in or outside of the home, wear a mask. 9. Avoid sharing personal items with other people in your household, like dishes, towels, and bedding. 10. Clean all surfaces that are touched often, like counters, tabletops, and doorknobs. Use household cleaning sprays or wipes according to the label instructions. cdc.gov/coronavirus 10/10/2018 This information is not intended to replace advice given to you by your health care provider. Make sure you discuss any questions you have with your health care provider. Document Revised: 03/14/2019 Document Reviewed: 03/14/2019 Elsevier Patient Education  2020 Elsevier Inc. 10 Things You Can Do to Manage Your COVID-19 Symptoms at Home If you have possible or confirmed COVID-19: 11. Stay home from work and school. And stay away from other public places. If you must go out, avoid using any kind of public  transportation, ridesharing, or taxis. 12. Monitor your symptoms carefully. If your symptoms get worse, call your healthcare provider immediately. 13. Get rest and stay hydrated. 14. If you have a medical appointment, call the healthcare provider ahead of time and tell them that you have or may have COVID-19. 15. For medical emergencies, call 911 and notify the dispatch personnel that you have or may have COVID-19. 16. Cover your cough and sneezes with a tissue or use the inside of your elbow. 17. Wash your hands often with soap and water for at least 20 seconds or clean your hands with an alcohol-based hand sanitizer that contains at least 60% alcohol. 18. As much as possible, stay in a specific room and away from other people in your home. Also, you should use a separate bathroom, if available. If you need to be around other people in or outside of the home, wear a mask. 19. Avoid sharing personal items with other people in your household, like dishes, towels, and bedding. 20. Clean all surfaces that are touched often, like counters, tabletops, and doorknobs. Use household cleaning sprays or wipes according to the label instructions. cdc.gov/coronavirus 10/10/2018 This information is not intended to replace advice given to you by your health care provider. Make sure you discuss any questions you have with your health care provider. Document Revised: 03/14/2019 Document Reviewed: 03/14/2019 Elsevier Patient Education  2020 Elsevier Inc. What types of side effects do monoclonal antibody drugs cause?  Common side effects  In general, the more common side effects caused by monoclonal antibody drugs include: . Allergic reactions,   such as hives or itching . Flu-like signs and symptoms, including chills, fatigue, fever, and muscle aches and pains . Nausea, vomiting . Diarrhea . Skin rashes . Low blood pressure   The CDC is recommending patients who receive monoclonal antibody treatments wait  at least 90 days before being vaccinated.  Currently, there are no data on the safety and efficacy of mRNA COVID-19 vaccines in persons who received monoclonal antibodies or convalescent plasma as part of COVID-19 treatment. Based on the estimated half-life of such therapies as well as evidence suggesting that reinfection is uncommon in the 90 days after initial infection, vaccination should be deferred for at least 90 days, as a precautionary measure until additional information becomes available, to avoid interference of the antibody treatment with vaccine-induced immune responses. 

## 2019-05-07 NOTE — Progress Notes (Signed)
  Diagnosis: COVID-19  Physician:  Procedure: [facility] Infusion [facility] Med: bamlanivimab infusion - Provided patient with bamlanimivab fact sheet for patients, parents and caregivers prior to infusion.  Complications: No immediate complications noted.  Discharge: Discharged home   [name] 05/07/2019

## 2019-05-10 ENCOUNTER — Other Ambulatory Visit: Payer: Self-pay | Admitting: Family Medicine

## 2019-05-10 DIAGNOSIS — R42 Dizziness and giddiness: Secondary | ICD-10-CM

## 2019-05-10 NOTE — Progress Notes (Signed)
Contacted by PT to resend referral as patient's prior evaluation was cut short due to testing positive for Covid and being admitted to the hospital for mental status changes.

## 2019-06-05 ENCOUNTER — Other Ambulatory Visit: Payer: Self-pay

## 2019-06-05 MED ORDER — ROSUVASTATIN CALCIUM 20 MG PO TABS: 20.0000 mg | ORAL_TABLET | Freq: Every day | ORAL | 3 refills | Status: DC

## 2019-06-12 ENCOUNTER — Telehealth (INDEPENDENT_AMBULATORY_CARE_PROVIDER_SITE_OTHER): Payer: Medicare Other | Admitting: Family Medicine

## 2019-06-12 ENCOUNTER — Other Ambulatory Visit: Payer: Self-pay

## 2019-06-12 DIAGNOSIS — U071 COVID-19: Secondary | ICD-10-CM

## 2019-06-12 NOTE — Progress Notes (Signed)
[place] Telemedicine Visit  Patient consented to have virtual visit. Method of visit: Telephone  Encounter participants: Patient: [name] - located at Home Provider: Caroline More - located at [facility]  Others (if applicable): None  Chief Complaint: question regarding COVID vaccine  HPI: Question regarding covid vaccine  Patient had COVID back in January. Had infusion back in January as well. Per chart review had infusion of bamlanivimab on 1/26. Was wondering how long to wait for the COVID vaccine. Patient states "I really want to take the vaccine" but her sister is a pharmacist and told her to wait.   ROS: per HPI  Pertinent PMHx: CAD, HTN, COVID 51, T2DM  Exam:  Respiratory: speaking full sentences, no increased WOB  Assessment/Plan:  COVID-19 virus infection Patient is interested in obtaining the coronavirus vaccine however received infusion of bamlanimivab 05/07/19. Per CDC guidelines, patients who were treated for COVID-19 with monoclonal antibodies or convalescent plasma, should wait 90 days before getting a COVID-19 vaccine. Discussed with Dr. Andria Frames to confirm recommendations. Patient will be eligible for vaccine ~08/05/19. Informed patient and she was appreciative of recommendations. Patient does not have access to the Internet but her daughter would like to read information on COVID. I informed her the CDC was a great resource to learn information and I would be happy to answer any questions as well.      Time spent during visit with patient: 10 minutes

## 2019-06-12 NOTE — Assessment & Plan Note (Addendum)
Patient is interested in obtaining the coronavirus vaccine however received infusion of bamlanimivab 05/07/19. Per CDC guidelines, patients who were treated for COVID-19 with monoclonal antibodies or convalescent plasma, should wait 90 days before getting a COVID-19 vaccine. Discussed with Dr. Andria Frames to confirm recommendations. Patient will be eligible for vaccine ~08/05/19. Informed patient and she was appreciative of recommendations. Patient does not have access to the Internet but her daughter would like to read information on COVID. I informed her the CDC was a great resource to learn information and I would be happy to answer any questions as well.

## 2019-06-27 ENCOUNTER — Other Ambulatory Visit: Payer: Self-pay | Admitting: *Deleted

## 2019-06-27 DIAGNOSIS — E1165 Type 2 diabetes mellitus with hyperglycemia: Secondary | ICD-10-CM

## 2019-06-27 MED ORDER — JARDIANCE 10 MG PO TABS: 10.0000 mg | ORAL_TABLET | Freq: Every day | ORAL | 2 refills | Status: DC

## 2019-06-28 ENCOUNTER — Encounter: Payer: Self-pay | Admitting: Interventional Cardiology

## 2019-06-28 ENCOUNTER — Other Ambulatory Visit: Payer: Self-pay

## 2019-06-28 ENCOUNTER — Ambulatory Visit (INDEPENDENT_AMBULATORY_CARE_PROVIDER_SITE_OTHER): Payer: Medicare Other | Admitting: Interventional Cardiology

## 2019-06-28 VITALS — BP 112/60 | HR 72 | Ht 64.0 in | Wt 129.8 lb

## 2019-06-28 DIAGNOSIS — N189 Chronic kidney disease, unspecified: Secondary | ICD-10-CM

## 2019-06-28 DIAGNOSIS — U071 COVID-19: Secondary | ICD-10-CM | POA: Diagnosis not present

## 2019-06-28 DIAGNOSIS — I10 Essential (primary) hypertension: Secondary | ICD-10-CM

## 2019-06-28 DIAGNOSIS — E1165 Type 2 diabetes mellitus with hyperglycemia: Secondary | ICD-10-CM | POA: Diagnosis not present

## 2019-06-28 DIAGNOSIS — Z85038 Personal history of other malignant neoplasm of large intestine: Secondary | ICD-10-CM | POA: Insufficient documentation

## 2019-06-28 DIAGNOSIS — I2510 Atherosclerotic heart disease of native coronary artery without angina pectoris: Secondary | ICD-10-CM

## 2019-06-28 DIAGNOSIS — C189 Malignant neoplasm of colon, unspecified: Secondary | ICD-10-CM

## 2019-06-28 DIAGNOSIS — Z7189 Other specified counseling: Secondary | ICD-10-CM

## 2019-06-28 DIAGNOSIS — I25118 Atherosclerotic heart disease of native coronary artery with other forms of angina pectoris: Secondary | ICD-10-CM

## 2019-06-28 HISTORY — DX: Malignant neoplasm of colon, unspecified: C18.9

## 2019-06-28 NOTE — Progress Notes (Signed)
Cardiology Office Note:    Date:  06/28/2019   ID:  [name], DOB Feb 05, 1946, MRN [id number]  PCP:  Anne Haymaker, MD  Cardiologist:  Anne Grooms, MD   Referring MD: Anne Haymaker, MD   No chief complaint on file.   History of Present Illness:    [name] is a 74 y.o. female with a hx of multiple cardiac risk factors including type 2 diabetes, hypertension, hyperlipidemia,CAD with angina pectoris (cath 04/17/18 - 90% apical LAD, 50-70% Cfx, RCA, & PDA), and prior brain tumor.  Had COVID-19 disease in January 2021.  She is now 2 months post COVID-19 infection.  She did not have to be hospitalized.  She received a monoclonal antibody infusion.  Her course of illness was 14 to 20 days.  She still feels that she is getting over the Covid infection.  She feels weak and tired.  No cough or respiratory symptoms.  During Covid she continued to have angina, but it was not worse than what she was experiencing before or after.  She occasionally uses sublingual nitroglycerin.  Past Medical History:  Diagnosis Date  . Allergy   . Asthma   . Blood transfusion without reported diagnosis 12/23/2004   with colon cancer  . Brain tumor (benign) ([place])   . Colon cancer [facility]) Nov. 27,  2006  . Diabetes mellitus    Type 2  . Female bladder prolapse   . Gastritis   . GERD (gastroesophageal reflux disease)   . Glaucoma   . Hypercholesterolemia   . Hypertension     Past Surgical History:  Procedure Laterality Date  . ABDOMINAL HYSTERECTOMY  1976  . BALLOON DILATION N/A 08/23/2017   Procedure: BALLOON DILATION;  Surgeon: Anne Banister, MD;  Location: [facility];  Service: Endoscopy;  Laterality: N/A;  Esophageal Diltation  . bladder tac    . CARDIAC CATHETERIZATION  2005  . CATARACT EXTRACTION, BILATERAL    . COLONOSCOPY W/ BIOPSIES    . CORONARY STENT INTERVENTION N/A 01/17/2019   Procedure: CORONARY STENT INTERVENTION;  Surgeon: Anne Bush, MD;  Location: [facility];  Service: Cardiovascular;  Laterality: N/A;  . ESOPHAGEAL MANOMETRY N/A 03/20/2015   Procedure: ESOPHAGEAL MANOMETRY (EM);  Surgeon: Anne Mayer, MD;  Location: [facility];  Service: Endoscopy;  Laterality: N/A;  . ESOPHAGOGASTRODUODENOSCOPY (EGD) WITH PROPOFOL N/A 08/23/2017   Procedure: ESOPHAGOGASTRODUODENOSCOPY (EGD) WITH PROPOFOL;  Surgeon: Anne Banister, MD;  Location: [facility];  Service: Endoscopy;  Laterality: N/A;  . EYE SURGERY     mole removed left eye  . INTRAVASCULAR PRESSURE WIRE/FFR STUDY N/A 01/17/2019   Procedure: INTRAVASCULAR PRESSURE WIRE/FFR STUDY;  Surgeon: Anne Bush, MD;  Location: [facility];  Service: Cardiovascular;  Laterality: N/A;  . LAPAROSCOPIC NISSEN FUNDOPLICATION  1962  . LEFT HEART CATH AND CORONARY ANGIOGRAPHY N/A 04/17/2018   Procedure: LEFT HEART CATH AND CORONARY ANGIOGRAPHY;  Surgeon: Anne Crome, MD;  Location: [facility];  Service: Cardiovascular;  Laterality: N/A;  . LEFT HEART CATH AND CORONARY ANGIOGRAPHY N/A 01/17/2019   Procedure: LEFT HEART CATH AND CORONARY ANGIOGRAPHY;  Surgeon: Anne Bush, MD;  Location: [facility];  Service: Cardiovascular;  Laterality: N/A;  . repair prolapsedbladder    . RIGHT COLECTOMY  Nov. 27, 2006  . TMJ ARTHROPLASTY    . UPPER GASTROINTESTINAL ENDOSCOPY      Current Medications: Current Meds  Medication Sig  . amLODipine (NORVASC) 5 MG  tablet Take 1 tablet (5 mg total) by mouth daily.  Marland Kitchen aspirin 81 MG chewable tablet Chew 81 mg by mouth daily.    . Blood Glucose Monitoring Suppl (ONETOUCH VERIO) w/Device KIT 1 each by Does not apply route daily.  . clopidogrel (PLAVIX) 75 MG tablet Take 1 tablet (75 mg total) by mouth daily.  . diclofenac sodium (VOLTAREN) 1 % GEL Apply 2 g topically as needed.  . empagliflozin (JARDIANCE) 10 MG TABS tablet Take 10 mg by mouth daily.  Marland Kitchen glucose blood (ONETOUCH VERIO) test strip Use as instructed  . Insulin Glargine (LANTUS  SOLOSTAR) 100 UNIT/ML Solostar Pen Inject 15 Units into the skin daily at 10 pm.  . Insulin Pen Needle 31G X 5 MM MISC Check blood sugar once daily  . isosorbide mononitrate (IMDUR) 60 MG 24 hr tablet Take 1 tablet (60 mg total) by mouth daily.  Elmore Guise Devices (ONE TOUCH DELICA LANCING DEV) MISC Check blood sugar once daily  . nitroGLYCERIN (NITROSTAT) 0.4 MG SL tablet Place 1 tablet (0.4 mg total) under the tongue every 5 (five) minutes as needed. If chest pain not relieved by third tab call 911.  . quinapril-hydrochlorothiazide (ACCURETIC) 20-12.5 MG tablet Take 1 tablet by mouth daily.  . rosuvastatin (CRESTOR) 20 MG tablet Take 1 tablet (20 mg total) by mouth daily.     Allergies:   Metformin and related   Social History   Socioeconomic History  . Marital status: Legally Separated    Spouse name: Not on file  . Number of children: 2  . Years of education: Not on file  . Highest education level: Not on file  Occupational History  . Not on file  Tobacco Use  . Smoking status: Never Smoker  . Smokeless tobacco: Never Used  Substance and Sexual Activity  . Alcohol use: No  . Drug use: No  . Sexual activity: Not on file  Other Topics Concern  . Not on file  Social History Narrative  . Not on file   Social Determinants of Health   Financial Resource Strain:   . Difficulty of Paying Living Expenses:   Food Insecurity:   . Worried About Charity fundraiser in the Last Year:   . Arboriculturist in the Last Year:   Transportation Needs:   . Film/video editor (Medical):   Marland Kitchen Lack of Transportation (Non-Medical):   Physical Activity:   . Days of Exercise per Week:   . Minutes of Exercise per Session:   Stress:   . Feeling of Stress :   Social Connections:   . Frequency of Communication with Friends and Family:   . Frequency of Social Gatherings with Friends and Family:   . Attends Religious Services:   . Active Member of Clubs or Organizations:   . Attends Theatre manager Meetings:   Marland Kitchen Marital Status:      Family History: The patient's family history includes Bone cancer in her father; Diabetes in her daughter; Heart attack in her daughter; Hypertension in her daughter; Lymphoma in an other family member; Suicidality in her son. There is no history of Colon cancer or Stomach cancer.  ROS:   Please see the history of present illness.    Afraid to go out and do much walking.  Afraid she might get Covid again.  Her entire family had Covid.  All other systems reviewed and are negative.  EKGs/Labs/Other Studies Reviewed:    The following studies were  reviewed today: No new data  EKG:  EKG not repeated.  Formed on 05/07/2019 when the diagnosis of COVID-19 was made revealed sinus bradycardia 50 bpm.  No acute ST-T wave change was noted.  Recent Labs: 01/15/2019: B Natriuretic Peptide 81.2; TSH 0.755 05/05/2019: ALT 11; BUN 44; Creatinine, Ser 2.21; Hemoglobin 8.8; Platelets 241; Potassium 3.7; Sodium 136  Recent Lipid Panel    Component Value Date/Time   CHOL 84 (L) 04/12/2018 1443   TRIG 123 04/12/2018 1443   HDL 43 04/12/2018 1443   CHOLHDL 2.0 04/12/2018 1443   CHOLHDL 3.8 07/20/2015 1641   VLDL 74 (H) 07/20/2015 1641   LDLCALC 16 04/12/2018 1443    Physical Exam:    VS:  BP 112/60   Pulse 72   Ht _0  (1.626 m)   Wt 129 lb 12.8 oz (58.9 kg)   SpO2 97%   BMI 22.28 kg/m     Wt Readings from Last 3 Encounters:  06/28/19 129 lb 12.8 oz (58.9 kg)  04/02/19 131 lb 12.8 oz (59.8 kg)  02/26/19 128 lb (58.1 kg)     GEN: Has less here than when I saw her in the past.. No acute distress HEENT: Normal NECK: No JVD. LYMPHATICS: No lymphadenopathy CARDIAC:  RRR without murmur, gallop, or edema. VASCULAR:  Normal Pulses. No bruits. RESPIRATORY:  Clear to auscultation without rales, wheezing or rhonchi  ABDOMEN: Soft, non-tender, non-distended, No pulsatile mass, MUSCULOSKELETAL: No deformity  SKIN: Warm and dry NEUROLOGIC:  Alert  and oriented x 3 PSYCHIATRIC:  Normal affect   ASSESSMENT:    1. Coronary artery disease involving native heart without angina pectoris, unspecified vessel or lesion type   2. Hypertension, unspecified type   3. Chronic kidney disease, unspecified CKD stage   4. Essential hypertension   5. Uncontrolled type 2 diabetes mellitus with hyperglycemia ([place])   6. 2019 novel coronavirus disease (COVID-19)   7. Educated about COVID-19 virus infection    PLAN:    In order of problems listed above:  1. No angina pectoris.  Secondary prevention including exercise, lipid control, and glycemic control were discussed in detail.  Use nitroglycerin as needed if episodes of chest pain.  Notify us of acceleration in symptoms.  Having COVID-19 was a pretty significant stress test, and she did quite well with reference to symptoms of ischemia. 2. Blood pressure control is excellent. 3. Most recent kidney function revealed a creatinine of 2.2. 4. Excellent blood pressure control.  Continue same therapy 5. Hemoglobin A1c target less than 7.  Advised to decrease dietary carbohydrate intake.  6. She had COVID-19.  She has not hypoxic, does not have cough, and breathing is normalized. 7. Plans to take the vaccine in April/May timeframe.  Social distancing is being practiced.   Plan clinical follow-up in 6 months.   Medication Adjustments/Labs and Tests Ordered: Current medicines are reviewed at length with the patient today.  Concerns regarding medicines are outlined above.  No orders of the defined types were placed in this encounter.  No orders of the defined types were placed in this encounter.   Patient Instructions  Medication Instructions:  Your physician recommends that you continue on your current medications as directed. Please refer to the Current Medication list given to you today.  *If you need a refill on your cardiac medications before your next appointment, please call your  pharmacy*   Lab Work: None If you have labs (blood work) drawn today and your tests are completely  normal, you will receive your results only by: Marland Kitchen MyChart Message (if you have MyChart) OR . A paper copy in the mail If you have any lab test that is abnormal or we need to change your treatment, we will call you to review the results.   Testing/Procedures: None   Follow-Up: At [facility], you and your health needs are our priority.  As part of our continuing mission to provide you with exceptional heart care, we have created designated Provider Care Teams.  These Care Teams include your primary Cardiologist (physician) and Advanced Practice Providers (APPs -  Physician Assistants and Nurse Practitioners) who all work together to provide you with the care you need, when you need it.  We recommend signing up for the patient portal called "MyChart".  Sign up information is provided on this After Visit Summary.  MyChart is used to connect with patients for Virtual Visits (Telemedicine).  Patients are able to view lab/test results, encounter notes, upcoming appointments, etc.  Non-urgent messages can be sent to your provider as well.   To learn more about what you can do with MyChart, go to [email or web address].    Your next appointment:   6 month(s)  The format for your next appointment:   In Person  Provider:   You may see Anne Grooms, MD or one of the following Advanced Practice Providers on your designated Care Team:    Truitt Merle, NP  Cecilie Kicks, NP  Kathyrn Drown, NP    Other Instructions      Signed, Anne Grooms, MD  06/28/2019 2:16 PM    [name]

## 2019-06-28 NOTE — Patient Instructions (Signed)

## 2019-08-15 ENCOUNTER — Telehealth: Payer: Self-pay

## 2019-08-15 NOTE — Telephone Encounter (Signed)
Patient [name] on nurse line stating she needs to speak with PCP. I attempted to call patient back to get more information, however she did not answer and no option for VM.

## 2019-10-12 DIAGNOSIS — H5213 Myopia, bilateral: Secondary | ICD-10-CM | POA: Diagnosis not present

## 2019-10-18 ENCOUNTER — Other Ambulatory Visit: Payer: Self-pay

## 2019-10-18 MED ORDER — CLOPIDOGREL BISULFATE 75 MG PO TABS: 75.0000 mg | ORAL_TABLET | Freq: Every day | ORAL | 2 refills | Status: DC

## 2019-10-30 ENCOUNTER — Ambulatory Visit (INDEPENDENT_AMBULATORY_CARE_PROVIDER_SITE_OTHER): Payer: Medicare Other | Admitting: Family Medicine

## 2019-10-30 ENCOUNTER — Other Ambulatory Visit: Payer: Self-pay

## 2019-10-30 ENCOUNTER — Encounter: Payer: Self-pay | Admitting: Family Medicine

## 2019-10-30 ENCOUNTER — Telehealth: Payer: Self-pay

## 2019-10-30 VITALS — BP 132/60 | HR 74 | Ht 64.0 in | Wt 141.5 lb

## 2019-10-30 DIAGNOSIS — Z1231 Encounter for screening mammogram for malignant neoplasm of breast: Secondary | ICD-10-CM

## 2019-10-30 DIAGNOSIS — K219 Gastro-esophageal reflux disease without esophagitis: Secondary | ICD-10-CM

## 2019-10-30 DIAGNOSIS — E1165 Type 2 diabetes mellitus with hyperglycemia: Secondary | ICD-10-CM

## 2019-10-30 DIAGNOSIS — D649 Anemia, unspecified: Secondary | ICD-10-CM | POA: Diagnosis not present

## 2019-10-30 DIAGNOSIS — M25559 Pain in unspecified hip: Secondary | ICD-10-CM

## 2019-10-30 DIAGNOSIS — D509 Iron deficiency anemia, unspecified: Secondary | ICD-10-CM | POA: Diagnosis not present

## 2019-10-30 HISTORY — DX: Pain in unspecified hip: M25.559

## 2019-10-30 LAB — POCT GLYCOSYLATED HEMOGLOBIN (HGB A1C): HbA1c, POC (controlled diabetic range): 6.7 % (ref 0.0–7.0)

## 2019-10-30 MED ORDER — FAMOTIDINE 20 MG PO TABS: 20.0000 mg | ORAL_TABLET | Freq: Two times a day (BID) | ORAL | 3 refills | Status: DC

## 2019-10-30 NOTE — Assessment & Plan Note (Signed)
-  Famotidine 20 mg.  Take every day for 1 week.  If symptoms improve after that to take only as needed.

## 2019-10-30 NOTE — Progress Notes (Signed)
SUBJECTIVE:   CHIEF COMPLAINT / HPI:   Diabetes -Empagliflozin 10 mg daily.  She reports that she has not been taking this medication for the past 3 months due to concern that it was causing palpitations.  She reports that her cardiologist also encouraged her to stop taking this medication. -Glargine 15 units nightly -Glipizide 5 mg.  She started taking glipizide 5 mg daily about 3 months ago.  This was a leftover medication she had from several years ago and has not been prescribed within the past year. -She is on a high intensity statin and an ACE inhibitor -Am BG from 100-150 -A1c today is 6.7.  Markedly improved from 10.3 about a year and a half ago.  Hip pain She notes that she has been having left-sided hip pain for roughly the past 3 months.  She had difficulty characterizing the pain exactly but felt that it did not fall into the categories of burning, dull, electric, shooting, stabbing, throbbing.  Overall, it seems to be a minor discomfort that does interfere with walking.  This does not seem to bother her at all with rest.  She has not noted any shooting pain down her leg or from one side of her hips to the other.  She does not notice any significant pain in her lower back.  She is suspicious that it might be hip arthritis or potentially related to her bladder stimulator.  Anemia The last CBC done on 1/24 showed a hemoglobin of 8.8.  Hemoccults done at that time was negative for blood in her stool.  She did have a ferritin of 33 in 10/7.  She denies any significant fatigue or hematochezia/melena.  She has previously been on iron pills and is aware that the have a tendency to turn her stool dark and make her constipated.  GERD She is previously been treated for GERD is not currently on any medications.  She reports that it has been flaring up and she has been getting a burning sensation in her stomach and esophagus that feels exactly like previous GERD symptoms.  She wants to know  if she can restart a medication for this issue.  Her current symptoms happen about twice per week.  PERTINENT  PMH / PSH: History of colon cancer s/p partial colectomy, CAD, CKD stage IIIb  OBJECTIVE:   BP 132/60   Pulse 74   Ht 5\' 4"  (1.626 m)   Wt 141 lb 8 oz (64.2 kg)   SpO2 99%   BMI 24.29 kg/m    General: Alert and cooperative and appears to be in no acute distress Respiratory: Breathing comfortably on room air.  No respiratory distress Lower back: No tenderness with percussion of the lumbar vertebrae.  No significant tenderness of the paraspinal muscles.  Mild tenderness with palpation over SI joints bilaterally.  Some tenderness with palpation over the bladder stem implant close to her left PSIS.  No tenderness to palpation of the greater trochanters bilaterally. Lower extremities: Negative straight leg raise bilaterally.  FABER and FADIR testing negative on the right.  Mild discomfort with FABER testing on the left no tenderness with FADIR testing.  No discomfort in the hips with internal and external rotation of the hips while in a seated position.   ASSESSMENT/PLAN:   ANEMIA, IRON DEFICIENCY, UNSPEC. Multifactorial.  Likely contributions from nutritional deficiency, chronic disease (worsening CKD) and possible GI bleed.  She did have an unremarkable colonoscopy in 2018 with recommended repeat in 5 years.  We will draw iron studies today and will refer her to GI for an early colonoscopy.  She is high risk for GI bleed based on her history of colon cancer. -Follow-up CBC, ferritin, TIBC, iron -Ambulatory referral to GI for colonoscopy  DM (diabetes mellitus), type 2, uncontrolled ([place]) I suspect her dramatically improved A1c is related to her worsening kidney function.  Based on her cardiac history, I would like her to be on an SGLT2 inhibitor or GLP-1 medication.  Before starting her on other these medications, would like approval from nephrology these medications would be  appropriate with her current renal function. -She was encouraged to follow-up with nephrology for discussion regarding above medications -Stop glipizide and stop Jardiance -Continue Lantus 15 units nightly -Follow-up in 3 months  GERD (gastroesophageal reflux disease) -Famotidine 20 mg.  Take every day for 1 week.  If symptoms improve after that to take only as needed.  Hip pain Based on physical exam, have a low suspicion for osteoarthritis of the hips or bursitis.  Arthritis of the SI joint is possible although her tenderness in this area was quite mild.  I think it is reasonable for her to discuss with urology if her implant is causing this discomfort and if it would be appropriate to consider removing the implant based on this discomfort.  If urology says that it is very unlikely this discomfort is from the implants, I will send her to physical therapy. -Follow-up with urology     [name], MD [place]

## 2019-10-30 NOTE — Patient Instructions (Signed)
Thanks for coming in today.  Here is a quick summary of the things we talked about:  Diabetes: Stop taking Jardiance.  Stop taking glipizide.  Continue taking glargine 15 units every night.  I am concerned that your improved A1c is related to your worsening kidney function.  I think that you need to talk to nephrology and get their "blessing" before starting a medication like Ozempic.  Please come back to clinic in 3 months so we can stay on top of this issue.  Hip pain: Based on my exam today, I do not think that this is osteoarthritis of your hips.  I think it is possible you has some inflammation of your sacroiliac joints but I also think it is possible this discomfort is caused from your bladder stimulator.  I recommend that you schedule an appointment with urology to discuss pain from his bladder stimulator and if it would be worth removing the stimulator.  Anemia: Please start taking iron supplements every other day (every other day pills help reduce constipation).  You can also take MiraLAX daily if constipation becomes an issue.  We are going to get blood test today to see if your anemia has improved.  I am also going to refer you to GI for another colonoscopy.  Based on your history, I think it is reasonable to rule out intestinal bleeding.

## 2019-10-30 NOTE — Assessment & Plan Note (Signed)
Based on physical exam, have a low suspicion for osteoarthritis of the hips or bursitis.  Arthritis of the SI joint is possible although her tenderness in this area was quite mild.  I think it is reasonable for her to discuss with urology if her implant is causing this discomfort and if it would be appropriate to consider removing the implant based on this discomfort.  If urology says that it is very unlikely this discomfort is from the implants, I will send her to physical therapy. -Follow-up with urology

## 2019-10-30 NOTE — Telephone Encounter (Signed)
Patient returns call to nurse line with information on kidney doctor. Patient is seeing Dr. Lawson Radar at [facility]. Contact number is [phone].    Patient states PCP was asking for this information at visit today.   Routing to PCP  Talbot Grumbling, RN

## 2019-10-30 NOTE — Telephone Encounter (Signed)
Did we get a signed ROI earlier today that we can add this to?  -Collier Salina

## 2019-10-30 NOTE — Assessment & Plan Note (Signed)
I suspect her dramatically improved A1c is related to her worsening kidney function.  Based on her cardiac history, I would like her to be on an SGLT2 inhibitor or GLP-1 medication.  Before starting her on other these medications, would like approval from nephrology these medications would be appropriate with her current renal function. -She was encouraged to follow-up with nephrology for discussion regarding above medications -Stop glipizide and stop Jardiance -Continue Lantus 15 units nightly -Follow-up in 3 months

## 2019-10-30 NOTE — Addendum Note (Signed)
Addended by: Maryland Pink on: 10/30/2019 02:25 PM   Modules accepted: Orders

## 2019-10-30 NOTE — Assessment & Plan Note (Signed)
Multifactorial.  Likely contributions from nutritional deficiency, chronic disease (worsening CKD) and possible GI bleed.  She did have an unremarkable colonoscopy in 2018 with recommended repeat in 5 years.  We will draw iron studies today and will refer her to GI for an early colonoscopy.  She is high risk for GI bleed based on her history of colon cancer. -Follow-up CBC, ferritin, TIBC, iron -Ambulatory referral to GI for colonoscopy

## 2019-10-30 NOTE — Assessment & Plan Note (Deleted)
Multifactorial.  Likely contributions from nutritional deficiency, chronic disease (worsening CKD) and possible GI bleed.  She did have an unremarkable colonoscopy in 2018 with recommended repeat in 5 years.  We will draw iron studies today and will refer her to GI for an early colonoscopy.  She is high risk for GI bleed based on her history of colon cancer. -Follow-up CBC, ferritin, TIBC, iron -Ambulatory referral to GI for colonoscopy

## 2019-10-31 LAB — BASIC METABOLIC PANEL
BUN/Creatinine Ratio: 30 — ABNORMAL HIGH (ref 12–28)
BUN: 43 mg/dL — ABNORMAL HIGH (ref 8–27)
CO2: 22 mmol/L (ref 20–29)
Calcium: 9.6 mg/dL (ref 8.7–10.3)
Chloride: 106 mmol/L (ref 96–106)
Creatinine, Ser: 1.43 mg/dL — ABNORMAL HIGH (ref 0.57–1.00)
GFR calc Af Amer: 42 mL/min/{1.73_m2} — ABNORMAL LOW (ref >59)
GFR calc non Af Amer: 36 mL/min/{1.73_m2} — ABNORMAL LOW (ref >59)
Glucose: 161 mg/dL — ABNORMAL HIGH (ref 65–99)
Potassium: 4.6 mmol/L (ref 3.5–5.2)
Sodium: 143 mmol/L (ref 134–144)

## 2019-10-31 LAB — FERRITIN: Ferritin: 75 ng/mL (ref 15–150)

## 2019-10-31 LAB — LIPID PANEL
Chol/HDL Ratio: 1.7 ratio (ref 0.0–4.4)
Cholesterol, Total: 94 mg/dL — ABNORMAL LOW (ref 100–199)
HDL: 56 mg/dL (ref >39)
LDL Chol Calc (NIH): 21 mg/dL (ref 0–99)
Triglycerides: 88 mg/dL (ref 0–149)
VLDL Cholesterol Cal: 17 mg/dL (ref 5–40)

## 2019-10-31 LAB — CBC
Hematocrit: 30.5 % — ABNORMAL LOW (ref 34.0–46.6)
Hemoglobin: 9.7 g/dL — ABNORMAL LOW (ref 11.1–15.9)
MCH: 28.1 pg (ref 26.6–33.0)
MCHC: 31.8 g/dL (ref 31.5–35.7)
MCV: 88 fL (ref 79–97)
Platelets: 220 10*3/uL (ref 150–450)
RBC: 3.45 x10E6/uL — ABNORMAL LOW (ref 3.77–5.28)
RDW: 13.4 % (ref 11.7–15.4)
WBC: 7.5 10*3/uL (ref 3.4–10.8)

## 2019-10-31 LAB — IRON AND TIBC
Iron Saturation: 22 % (ref 15–55)
Iron: 64 ug/dL (ref 27–139)
Total Iron Binding Capacity: 293 ug/dL (ref 250–450)
UIBC: 229 ug/dL (ref 118–369)

## 2019-10-31 NOTE — Telephone Encounter (Signed)
ROI was faxed to [facility]. Salvatore Marvel, CMA

## 2019-10-31 NOTE — Telephone Encounter (Signed)
ROI was signed by patient. Will fax later today to [facility]. Salvatore Marvel, CMA

## 2019-11-01 ENCOUNTER — Encounter: Payer: Self-pay | Admitting: Family Medicine

## 2019-11-06 ENCOUNTER — Ambulatory Visit (INDEPENDENT_AMBULATORY_CARE_PROVIDER_SITE_OTHER): Payer: Medicare Other | Admitting: Family Medicine

## 2019-11-06 ENCOUNTER — Other Ambulatory Visit: Payer: Self-pay

## 2019-11-06 DIAGNOSIS — Z135 Encounter for screening for eye and ear disorders: Secondary | ICD-10-CM

## 2019-11-08 ENCOUNTER — Telehealth: Payer: Self-pay | Admitting: *Deleted

## 2019-11-08 NOTE — Telephone Encounter (Signed)
-----   Message from [name], MD sent at 11/08/2019  2:51 PM EDT ----- Please call pt and let her know that I have reviewed the notes from her last visit with the kidney doctor (04/23/2019) and she was supposed to have a follow up visit after 4 months.  She is not over due for a visit with the kidney doctor please make sure they have reached out to schedule an appointment.  Thank you,  [name], MD

## 2019-11-08 NOTE — Telephone Encounter (Signed)
LM for patient with message from MD.  [name]

## 2019-11-09 DIAGNOSIS — H5203 Hypermetropia, bilateral: Secondary | ICD-10-CM | POA: Diagnosis not present

## 2019-11-11 NOTE — Progress Notes (Signed)
Patient was here solely for diabetic retinopathy screening through [employer] I did not see her or participate in her care. I am told the screening was canceled as patient had previously had an eye exam within 1-2 weeks prior to the appointment.  Leeanne Rio, MD

## 2019-11-14 ENCOUNTER — Encounter: Payer: Self-pay | Admitting: Internal Medicine

## 2019-11-15 ENCOUNTER — Other Ambulatory Visit: Payer: Self-pay

## 2019-11-15 ENCOUNTER — Other Ambulatory Visit: Payer: Self-pay | Admitting: Family Medicine

## 2019-11-15 ENCOUNTER — Ambulatory Visit
Admission: RE | Admit: 2019-11-15 | Discharge: 2019-11-15 | Disposition: A | Discharge status: Home / Self Care | Payer: Medicare Other | Source: Ambulatory Visit | Attending: Family Medicine | Admitting: Family Medicine

## 2019-11-15 DIAGNOSIS — Z1231 Encounter for screening mammogram for malignant neoplasm of breast: Secondary | ICD-10-CM | POA: Diagnosis not present

## 2019-11-26 ENCOUNTER — Ambulatory Visit
Admission: RE | Admit: 2019-11-26 | Discharge: 2019-11-26 | Disposition: A | Discharge status: Home / Self Care | Payer: Medicare Other | Source: Ambulatory Visit | Attending: Family Medicine | Admitting: Family Medicine

## 2019-11-26 ENCOUNTER — Other Ambulatory Visit: Payer: Self-pay | Admitting: Family Medicine

## 2019-11-26 ENCOUNTER — Other Ambulatory Visit: Payer: Self-pay

## 2019-11-26 ENCOUNTER — Ambulatory Visit (INDEPENDENT_AMBULATORY_CARE_PROVIDER_SITE_OTHER): Payer: Medicare Other | Admitting: Family Medicine

## 2019-11-26 VITALS — BP 142/56 | HR 68 | Ht 64.0 in | Wt 138.2 lb

## 2019-11-26 DIAGNOSIS — M79604 Pain in right leg: Secondary | ICD-10-CM

## 2019-11-26 DIAGNOSIS — M778 Other enthesopathies, not elsewhere classified: Secondary | ICD-10-CM | POA: Diagnosis not present

## 2019-11-26 DIAGNOSIS — M79605 Pain in left leg: Secondary | ICD-10-CM

## 2019-11-26 DIAGNOSIS — E1165 Type 2 diabetes mellitus with hyperglycemia: Secondary | ICD-10-CM

## 2019-11-26 DIAGNOSIS — M533 Sacrococcygeal disorders, not elsewhere classified: Secondary | ICD-10-CM | POA: Diagnosis not present

## 2019-11-26 DIAGNOSIS — M160 Bilateral primary osteoarthritis of hip: Secondary | ICD-10-CM | POA: Diagnosis not present

## 2019-11-26 DIAGNOSIS — N1832 Chronic kidney disease, stage 3b: Secondary | ICD-10-CM | POA: Diagnosis not present

## 2019-11-26 DIAGNOSIS — M47817 Spondylosis without myelopathy or radiculopathy, lumbosacral region: Secondary | ICD-10-CM | POA: Diagnosis not present

## 2019-11-26 DIAGNOSIS — M76892 Other specified enthesopathies of left lower limb, excluding foot: Secondary | ICD-10-CM | POA: Diagnosis not present

## 2019-11-26 MED ORDER — BACLOFEN 10 MG PO TABS: 5.0000 mg | ORAL_TABLET | Freq: Three times a day (TID) | ORAL | 0 refills | Status: DC

## 2019-11-26 NOTE — Patient Instructions (Signed)
Thank you for coming in today to be seen.  I would like to obtain some imaging of your hips and lower back.  We will also get some labs today as well.  I have sent baclofen to the pharmacy which should help with muscle spasms.  You can take 5 to 10 mg as needed up to 3 times a day.  Please be extra careful with walking around and safety precautions.  I have also sent you a referral to physical therapy so that you can start working with them. Please return if you are not having any improvement.  Depending on the results of the x-rays, we can consider obtaining an MRI of the back if we have any other concerns.

## 2019-11-26 NOTE — Progress Notes (Signed)
    SUBJECTIVE:   CHIEF COMPLAINT / HPI:   Acute onset leg cramps Sunday night in bilateral hips. Patient reports pain brought her to tears. Believes that it might be the cold air as she turned this off at home and it got a little better, but worsened as soon as she got here (where the air is on). Patient reports that the cramping is throughout her legs from her lower back to her feet. There is shooting pain that starts at her butt and goes down her legs posteriorly. Does reports a little bit of weakness. No urinary or bowel incontinence.   PERTINENT  PMH / PSH: CAD, anemia, diabetes, hypertension, hypertriglyceridemia, meningioma   OBJECTIVE:   BP (!) 142/56   Pulse 68   Ht 5\' 4"  (1.626 m)   Wt 138 lb 3.2 oz (62.7 kg)   SpO2 100%   BMI 23.72 kg/m    MSK: Bilateral legs with chronic muscle wasting. Generalized diffuse TTP of muscle groups in lower extremities, gluts, and lower back. 4/5 strength in bilateral lower extremities, except notable weakness with left hip flexor 2/5. Sensation intact b/l. 2+ b/l patellar and achiles DTRs.   ASSESSMENT/PLAN:   Bilateral leg pain Patient reports vague cramping and pain in her legs that started acutely 2 nights ago and woke her from her sleep.  She has been having difficulty walking since.  She reports some subjective weakness in her legs.  On physical exam, her reflexes are intact bilaterally, otherwise, remarkable for left hip flexor weakness 3 out of 5.  No tenderness to palpation of lumbar spine or muscular groups from hips to legs.  Neurologically intact.  We will start with hip x-rays and lumbar x-ray to rule out arthritis which can cause radiating pain.  Additionally, will obtain labs to rule out electrolyte causes of muscle spasms.  Sending referral to physical therapy for exercises to help with walking.  Her physical exam does not suggest a central cause of her leg pain, though will await x-rays to determine whether or not an MRI is needed.   She does have an implant in her PSIS that may interfere with imaging, though her medical implant card does report that some MRIs are compatible with her implant.    Wilber Oliphant, MD [place]

## 2019-11-26 NOTE — Assessment & Plan Note (Signed)
Patient reports vague cramping and pain in her legs that started acutely 2 nights ago and woke her from her sleep.  She has been having difficulty walking since.  She reports some subjective weakness in her legs.  On physical exam, her reflexes are intact bilaterally, otherwise, remarkable for left hip flexor weakness 3 out of 5.  No tenderness to palpation of lumbar spine or muscular groups from hips to legs.  Neurologically intact.  We will start with hip x-rays and lumbar x-ray to rule out arthritis which can cause radiating pain.  Additionally, will obtain labs to rule out electrolyte causes of muscle spasms.  Sending referral to physical therapy for exercises to help with walking.  Her physical exam does not suggest a central cause of her leg pain, though will await x-rays to determine whether or not an MRI is needed.  She does have an implant in her PSIS that may interfere with imaging, though her medical implant card does report that some MRIs are compatible with her implant.

## 2019-11-27 LAB — CBC
Hematocrit: 29.7 % — ABNORMAL LOW (ref 34.0–46.6)
Hemoglobin: 9.9 g/dL — ABNORMAL LOW (ref 11.1–15.9)
MCH: 29 pg (ref 26.6–33.0)
MCHC: 33.3 g/dL (ref 31.5–35.7)
MCV: 87 fL (ref 79–97)
Platelets: 144 10*3/uL — ABNORMAL LOW (ref 150–450)
RBC: 3.41 x10E6/uL — ABNORMAL LOW (ref 3.77–5.28)
RDW: 12.6 % (ref 11.7–15.4)
WBC: 7.9 10*3/uL (ref 3.4–10.8)

## 2019-11-27 LAB — BASIC METABOLIC PANEL
BUN/Creatinine Ratio: 22 (ref 12–28)
BUN: 36 mg/dL — ABNORMAL HIGH (ref 8–27)
CO2: 24 mmol/L (ref 20–29)
Calcium: 9.7 mg/dL (ref 8.7–10.3)
Chloride: 102 mmol/L (ref 96–106)
Creatinine, Ser: 1.65 mg/dL — ABNORMAL HIGH (ref 0.57–1.00)
GFR calc Af Amer: 35 mL/min/{1.73_m2} — ABNORMAL LOW (ref >59)
GFR calc non Af Amer: 31 mL/min/{1.73_m2} — ABNORMAL LOW (ref >59)
Glucose: 396 mg/dL — ABNORMAL HIGH (ref 65–99)
Potassium: 4.2 mmol/L (ref 3.5–5.2)
Sodium: 137 mmol/L (ref 134–144)

## 2019-11-27 LAB — CK: Total CK: 125 U/L (ref 32–182)

## 2019-12-03 ENCOUNTER — Other Ambulatory Visit: Payer: Self-pay | Admitting: Family Medicine

## 2019-12-13 ENCOUNTER — Encounter: Payer: Self-pay | Admitting: Family Medicine

## 2019-12-13 NOTE — Progress Notes (Signed)
Obtain BMP for possible electrolyte abnormalities that may have been causing patient's leg/muscle pain.  Patient's creatinine levels are stable and patient meets diagnostic criteria for CKD 3B.   - She should be referred to [facility] to establish care.    Additionally, appears that her glucose is very elevated.  Her diabetes continues to be uncontrolled.  She will need to follow-up with her primary care provider soon as possible for diabetes follow-up.    Additionally, obtained CBC given patient's history of anemia.  Her hemoglobin is stable at this time.  Patient would benefit from appointment for further work-up for her anemia.     Spoke to patient over the phone to encourage her to return for her continued hip pain, diabetes control and further anemia work-up.  Patient does report that she does not have any transportation to get to the office.  I will consult CCM for this patient to see if there are any other resources to help her with transportation in the setting of her chronic medical issues. She is agreeable for consult to CCM. Order placed.

## 2019-12-13 NOTE — Addendum Note (Signed)
Addended by: Wilber Oliphant on: 12/13/2019 08:44 PM   Modules accepted: Orders

## 2019-12-19 DIAGNOSIS — G44219 Episodic tension-type headache, not intractable: Secondary | ICD-10-CM | POA: Diagnosis not present

## 2019-12-27 ENCOUNTER — Telehealth: Payer: Self-pay | Admitting: Family Medicine

## 2019-12-27 NOTE — Telephone Encounter (Signed)
   SF 12/27/2019   Name: [name]   MRN: [id number]   DOB: 1945-06-12   AGE: 74 y.o.   GENDER: female   PCP [name], MD.   Spoke with [name] today regarding referral. Patient stated that she is still in need of assistance with transportation to and from her appointments. Informed patient that she has the [employer]. She should be eligible for up to 6 round-trips a year or 12 one-way trips. Patient stated that right now she is having issues with her eyes and that her daughter will be taking her to her visit for today, but she will try to give [facility] a call for transportation. Informed patient that when she is ready to use [facility] she can get her daughter to assist her with calling and scheduling the ride. Also informed patient that the number for transportation is listed on the back of her insurance card and that she will need to call a couple of days before her actual appointment to schedule. Patient stated understanding and that she has not additional needs at this time.   Closing referral pending any other needs of patient.      [place], Care Management Phone: [phone] Email: [email or web address]

## 2020-01-06 NOTE — Progress Notes (Signed)
Cardiology Office Note:    Date:  01/10/2020   ID:  [name], [name] 1945/05/08, MRN [id number]  PCP:  [name], MD  Cardiologist:  Sinclair Grooms, MD   Referring MD: [name], MD   Chief Complaint  Patient presents with  . Coronary Artery Disease    History of Present Illness:    [name] is a 74 y.o. female with a hx of multiple cardiac risk factors including type 2 diabetes, hypertension, hyperlipidemia,CAD with angina pectoris (cath 04/17/18 - 90% apical LAD, 50-70% Cfx, RCA, & PDA), and prior brain tumor.  Had COVID-19 disease in January 2021.  She is still having angina.  Anginal episodes are less frequent.  She is very compliant with her medication regimen and her metrics relative to risk reduction are quite nice.  She is having difficulty walking because of left hip pain.  Past Medical History:  Diagnosis Date  . Allergy   . Asthma   . Blood transfusion without reported diagnosis 12/23/2004   with colon cancer  . Brain tumor (benign) ([place])   . Colon cancer [facility]) Nov. 27,  2006  . Diabetes mellitus    Type 2  . Female bladder prolapse   . Gastritis   . GERD (gastroesophageal reflux disease)   . Glaucoma   . Hypercholesterolemia   . Hypertension     Past Surgical History:  Procedure Laterality Date  . ABDOMINAL HYSTERECTOMY  1976  . BALLOON DILATION N/A 08/23/2017   Procedure: BALLOON DILATION;  Surgeon: Milus Banister, MD;  Location: [facility];  Service: Endoscopy;  Laterality: N/A;  Esophageal Diltation  . bladder tac    . CARDIAC CATHETERIZATION  2005  . CATARACT EXTRACTION, BILATERAL    . COLONOSCOPY W/ BIOPSIES    . CORONARY STENT INTERVENTION N/A 01/17/2019   Procedure: CORONARY STENT INTERVENTION;  Surgeon: Nelva Bush, MD;  Location: [place] [facility];  Service: Cardiovascular;  Laterality: N/A;  . ESOPHAGEAL MANOMETRY N/A 03/20/2015   Procedure: ESOPHAGEAL MANOMETRY (EM);  Surgeon: Gatha Mayer, MD;   Location: [facility];  Service: Endoscopy;  Laterality: N/A;  . ESOPHAGOGASTRODUODENOSCOPY (EGD) WITH PROPOFOL N/A 08/23/2017   Procedure: ESOPHAGOGASTRODUODENOSCOPY (EGD) WITH PROPOFOL;  Surgeon: Milus Banister, MD;  Location: [facility];  Service: Endoscopy;  Laterality: N/A;  . EYE SURGERY     mole removed left eye  . INTRAVASCULAR PRESSURE WIRE/FFR STUDY N/A 01/17/2019   Procedure: INTRAVASCULAR PRESSURE WIRE/FFR STUDY;  Surgeon: Nelva Bush, MD;  Location: [place] [facility];  Service: Cardiovascular;  Laterality: N/A;  . LAPAROSCOPIC NISSEN FUNDOPLICATION  3614  . LEFT HEART CATH AND CORONARY ANGIOGRAPHY N/A 04/17/2018   Procedure: LEFT HEART CATH AND CORONARY ANGIOGRAPHY;  Surgeon: Belva Crome, MD;  Location: [place] [facility];  Service: Cardiovascular;  Laterality: N/A;  . LEFT HEART CATH AND CORONARY ANGIOGRAPHY N/A 01/17/2019   Procedure: LEFT HEART CATH AND CORONARY ANGIOGRAPHY;  Surgeon: Nelva Bush, MD;  Location: [place] [facility];  Service: Cardiovascular;  Laterality: N/A;  . repair prolapsedbladder    . RIGHT COLECTOMY  Nov. 27, 2006  . TMJ ARTHROPLASTY    . UPPER GASTROINTESTINAL ENDOSCOPY      Current Medications: Current Meds  Medication Sig  . amLODipine (NORVASC) 5 MG tablet Take 1 tablet (5 mg total) by mouth daily.  Marland Kitchen aspirin 81 MG chewable tablet Chew 81 mg by mouth daily.    . Blood Glucose Monitoring Suppl (ONETOUCH VERIO) w/Device KIT 1  each by Does not apply route daily.  . clopidogrel (PLAVIX) 75 MG tablet Take 1 tablet (75 mg total) by mouth daily.  . diclofenac sodium (VOLTAREN) 1 % GEL Apply 2 g topically as needed.  . famotidine (PEPCID) 20 MG tablet Take 1 tablet (20 mg total) by mouth 2 (two) times daily.  Marland Kitchen glucose blood (ONETOUCH VERIO) test strip Use as instructed  . Insulin Glargine (LANTUS SOLOSTAR) 100 UNIT/ML Solostar Pen Inject 15 Units into the skin daily at 10 pm.  . Insulin Pen Needle 31G X 5 MM MISC Check blood sugar once  daily  . isosorbide mononitrate (IMDUR) 60 MG 24 hr tablet Take 1 tablet (60 mg total) by mouth daily.  Elmore Guise Devices (ONE TOUCH DELICA LANCING DEV) MISC Check blood sugar once daily  . nitroGLYCERIN (NITROSTAT) 0.4 MG SL tablet Place 1 tablet (0.4 mg total) under the tongue every 5 (five) minutes as needed. If chest pain not relieved by third tab call 911.  . quinapril-hydrochlorothiazide (ACCURETIC) 20-12.5 MG tablet Take 1 tablet by mouth daily.  . rosuvastatin (CRESTOR) 20 MG tablet Take 1 tablet (20 mg total) by mouth daily.  . [DISCONTINUED] nitroGLYCERIN (NITROSTAT) 0.4 MG SL tablet Place 1 tablet (0.4 mg total) under the tongue every 5 (five) minutes as needed. If chest pain not relieved by third tab call 911.  . [DISCONTINUED] rosuvastatin (CRESTOR) 20 MG tablet Take 1 tablet (20 mg total) by mouth daily.     Allergies:   Metformin and related   Social History   Socioeconomic History  . Marital status: Legally Separated    Spouse name: Not on file  . Number of children: 2  . Years of education: Not on file  . Highest education level: Not on file  Occupational History  . Not on file  Tobacco Use  . Smoking status: Never Smoker  . Smokeless tobacco: Never Used  Vaping Use  . Vaping Use: Never used  Substance and Sexual Activity  . Alcohol use: No  . Drug use: No  . Sexual activity: Not on file  Other Topics Concern  . Not on file  Social History Narrative  . Not on file   Social Determinants of Health   Financial Resource Strain:   . Difficulty of Paying Living Expenses: Not on file  Food Insecurity:   . Worried About Charity fundraiser in the Last Year: Not on file  . Ran Out of Food in the Last Year: Not on file  Transportation Needs:   . Lack of Transportation (Medical): Not on file  . Lack of Transportation (Non-Medical): Not on file  Physical Activity:   . Days of Exercise per Week: Not on file  . Minutes of Exercise per Session: Not on file  Stress:    . Feeling of Stress : Not on file  Social Connections:   . Frequency of Communication with Friends and Family: Not on file  . Frequency of Social Gatherings with Friends and Family: Not on file  . Attends Religious Services: Not on file  . Active Member of Clubs or Organizations: Not on file  . Attends Archivist Meetings: Not on file  . Marital Status: Not on file     Family History: The patient's family history includes Bone cancer in her father; Diabetes in her daughter; Heart attack in her daughter; Hypertension in her daughter; Lymphoma in an other family member; Suicidality in her son. There is no history of Colon cancer  or Stomach cancer.  ROS:   Please see the history of present illness.    She feels that there is arthritis in her hip.  She cannot walk far because of the left buttocks pain.  She has mentioned this to primary care but feels that nothing is been done to evaluate.  All other systems reviewed and are negative.  EKGs/Labs/Other Studies Reviewed:    The following studies were reviewed today: No new cardiac data  EKG:  EKG not repeated  Recent Labs: 01/15/2019: B Natriuretic Peptide 81.2; TSH 0.755 05/05/2019: ALT 11 11/26/2019: BUN 36; Creatinine, Ser 1.65; Hemoglobin 9.9; Platelets 144; Potassium 4.2; Sodium 137  Recent Lipid Panel    Component Value Date/Time   CHOL 94 (L) 10/30/2019 1401   TRIG 88 10/30/2019 1401   HDL 56 10/30/2019 1401   CHOLHDL 1.7 10/30/2019 1401   CHOLHDL 3.8 07/20/2015 1641   VLDL 74 (H) 07/20/2015 1641   LDLCALC 21 10/30/2019 1401    Physical Exam:    VS:  BP (!) 134/58   Pulse 66   Ht $R'5\' 4"'JX$  (1.626 m)   Wt 143 lb 12.8 oz (65.2 kg)   SpO2 97%   BMI 24.68 kg/m     Wt Readings from Last 3 Encounters:  01/10/20 143 lb 12.8 oz (65.2 kg)  11/26/19 138 lb 3.2 oz (62.7 kg)  10/30/19 141 lb 8 oz (64.2 kg)     GEN: Compatible with age. No acute distress HEENT: Normal NECK: No JVD. LYMPHATICS: No  lymphadenopathy CARDIAC:  RRR without murmur, gallop, or edema. VASCULAR:  Normal Pulses. No bruits. RESPIRATORY:  Clear to auscultation without rales, wheezing or rhonchi  ABDOMEN: Soft, non-tender, non-distended, No pulsatile mass, MUSCULOSKELETAL: No deformity  SKIN: Warm and dry NEUROLOGIC:  Alert and oriented x 3 PSYCHIATRIC:  Normal affect   ASSESSMENT:    1. Coronary artery disease involving native heart without angina pectoris, unspecified vessel or lesion type   2. Hypertension, unspecified type   3. Chronic kidney disease, unspecified CKD stage   4. Uncontrolled type 2 diabetes mellitus with hyperglycemia ([place])   5. Educated about COVID-19 virus infection   6. Stable angina (HCC)   7. Left leg claudication ([place])   8. Hyperlipidemia with target LDL less than 70    PLAN:    In order of problems listed above:  1. Secondary prevention is discussed.  Risk factors under reasonably good control.  Not able to get physical activity and as much as she would like related to exertional left hip pain.  Hip pain is not present at rest or while sitting. 2. Continue Norvasc 5 mg/day, Imdur 60 mg/day, Accuretic 20/12.5 mg/day. 3. It would be nice if Jardiance had been continued.  And noted has been stopped.  Continue to monitor kidney function. 4. The hemoglobin A1c is 6.7.  I encouraged to increase physical activity but this will be difficult with the hip discomfort that she complains of. 5. COVID-19 infection followed by COVID-19 vaccination.  Stable currently. 6. Encourage use of nitroglycerin and to notify us if change in pattern.  Very stable at this time. 7. Exertional left hip/buttocks pain.  Rule out claudication.  Lower extremity Doppler study to exclude iliac disease. 8. Continue Crestor 20 mg/day.  Target LDL less than 70.  Overall education and awareness concerning secondary risk prevention was discussed in detail: LDL less than 70, hemoglobin A1c less than 7, blood pressure  target less than 130/80 mmHg, >150 minutes of moderate aerobic  activity per week, avoidance of smoking, weight control (via diet and exercise), and continued surveillance/management of/for obstructive sleep apnea.    Medication Adjustments/Labs and Tests Ordered: Current medicines are reviewed at length with the patient today.  Concerns regarding medicines are outlined above.  Orders Placed This Encounter  Procedures  . VAS [place] LOWER EXTREMITY ARTERIAL DUPLEX   Meds ordered this encounter  Medications  . nitroGLYCERIN (NITROSTAT) 0.4 MG SL tablet    Sig: Place 1 tablet (0.4 mg total) under the tongue every 5 (five) minutes as needed. If chest pain not relieved by third tab call 911.    Dispense:  30 tablet    Refill:  0  . rosuvastatin (CRESTOR) 20 MG tablet    Sig: Take 1 tablet (20 mg total) by mouth daily.    Dispense:  90 tablet    Refill:  3    Patient Instructions  Medication Instructions:  Your physician recommends that you continue on your current medications as directed. Please refer to the Current Medication list given to you today.  *If you need a refill on your cardiac medications before your next appointment, please call your pharmacy*   Lab Work: None If you have labs (blood work) drawn today and your tests are completely normal, you will receive your results only by: Marland Kitchen MyChart Message (if you have MyChart) OR . A paper copy in the mail If you have any lab test that is abnormal or we need to change your treatment, we will call you to review the results.   Testing/Procedures: Your physician has requested that you have a lower or upper extremity arterial duplex. This test is an ultrasound of the arteries in the legs or arms. It looks at arterial blood flow in the legs and arms. Allow one hour for Lower and Upper Arterial scans. There are no restrictions or special instructions   Follow-Up: At [facility], you and your health needs are our priority.  As part of  our continuing mission to provide you with exceptional heart care, we have created designated Provider Care Teams.  These Care Teams include your primary Cardiologist (physician) and Advanced Practice Providers (APPs -  Physician Assistants and Nurse Practitioners) who all work together to provide you with the care you need, when you need it.  We recommend signing up for the patient portal called "MyChart".  Sign up information is provided on this After Visit Summary.  MyChart is used to connect with patients for Virtual Visits (Telemedicine).  Patients are able to view lab/test results, encounter notes, upcoming appointments, etc.  Non-urgent messages can be sent to your provider as well.   To learn more about what you can do with MyChart, go to [email or web address].    Your next appointment:   6-9 month(s)  The format for your next appointment:   In Person  Provider:   You may see Sinclair Grooms, MD or one of the following Advanced Practice Providers on your designated Care Team:    Truitt Merle, NP  Cecilie Kicks, NP  Kathyrn Drown, NP    Other Instructions      Signed, Sinclair Grooms, MD  01/10/2020 10:02 AM    [place]

## 2020-01-08 ENCOUNTER — Other Ambulatory Visit: Payer: Self-pay | Admitting: Family Medicine

## 2020-01-10 ENCOUNTER — Ambulatory Visit (INDEPENDENT_AMBULATORY_CARE_PROVIDER_SITE_OTHER): Payer: Medicare Other | Admitting: Interventional Cardiology

## 2020-01-10 ENCOUNTER — Encounter: Payer: Self-pay | Admitting: Interventional Cardiology

## 2020-01-10 ENCOUNTER — Other Ambulatory Visit: Payer: Self-pay

## 2020-01-10 VITALS — BP 134/58 | HR 66 | Ht 64.0 in | Wt 143.8 lb

## 2020-01-10 DIAGNOSIS — Z7189 Other specified counseling: Secondary | ICD-10-CM | POA: Diagnosis not present

## 2020-01-10 DIAGNOSIS — N189 Chronic kidney disease, unspecified: Secondary | ICD-10-CM

## 2020-01-10 DIAGNOSIS — I2510 Atherosclerotic heart disease of native coronary artery without angina pectoris: Secondary | ICD-10-CM

## 2020-01-10 DIAGNOSIS — E1165 Type 2 diabetes mellitus with hyperglycemia: Secondary | ICD-10-CM

## 2020-01-10 DIAGNOSIS — I10 Essential (primary) hypertension: Secondary | ICD-10-CM | POA: Diagnosis not present

## 2020-01-10 DIAGNOSIS — I208 Other forms of angina pectoris: Secondary | ICD-10-CM

## 2020-01-10 DIAGNOSIS — I2089 Other forms of angina pectoris: Secondary | ICD-10-CM

## 2020-01-10 DIAGNOSIS — I739 Peripheral vascular disease, unspecified: Secondary | ICD-10-CM

## 2020-01-10 DIAGNOSIS — E785 Hyperlipidemia, unspecified: Secondary | ICD-10-CM

## 2020-01-10 MED ORDER — ROSUVASTATIN CALCIUM 20 MG PO TABS: 20.0000 mg | ORAL_TABLET | Freq: Every day | ORAL | 3 refills | Status: DC

## 2020-01-10 MED ORDER — NITROGLYCERIN 0.4 MG SL SUBL: 0.4000 mg | SUBLINGUAL_TABLET | SUBLINGUAL | 0 refills | Status: DC | PRN

## 2020-01-10 NOTE — Patient Instructions (Signed)
Medication Instructions:  Your physician recommends that you continue on your current medications as directed. Please refer to the Current Medication list given to you today.  *If you need a refill on your cardiac medications before your next appointment, please call your pharmacy*   Lab Work: None If you have labs (blood work) drawn today and your tests are completely normal, you will receive your results only by: Marland Kitchen MyChart Message (if you have MyChart) OR . A paper copy in the mail If you have any lab test that is abnormal or we need to change your treatment, we will call you to review the results.   Testing/Procedures: Your physician has requested that you have a lower or upper extremity arterial duplex. This test is an ultrasound of the arteries in the legs or arms. It looks at arterial blood flow in the legs and arms. Allow one hour for Lower and Upper Arterial scans. There are no restrictions or special instructions   Follow-Up: At [facility], you and your health needs are our priority.  As part of our continuing mission to provide you with exceptional heart care, we have created designated Provider Care Teams.  These Care Teams include your primary Cardiologist (physician) and Advanced Practice Providers (APPs -  Physician Assistants and Nurse Practitioners) who all work together to provide you with the care you need, when you need it.  We recommend signing up for the patient portal called "MyChart".  Sign up information is provided on this After Visit Summary.  MyChart is used to connect with patients for Virtual Visits (Telemedicine).  Patients are able to view lab/test results, encounter notes, upcoming appointments, etc.  Non-urgent messages can be sent to your provider as well.   To learn more about what you can do with MyChart, go to [email or web address].    Your next appointment:   6-9 month(s)  The format for your next appointment:   In Person  Provider:   You may  see Sinclair Grooms, MD or one of the following Advanced Practice Providers on your designated Care Team:    Truitt Merle, NP  Cecilie Kicks, NP  Kathyrn Drown, NP    Other Instructions

## 2020-01-14 ENCOUNTER — Other Ambulatory Visit: Payer: Self-pay | Admitting: Interventional Cardiology

## 2020-01-14 DIAGNOSIS — I739 Peripheral vascular disease, unspecified: Secondary | ICD-10-CM

## 2020-01-20 ENCOUNTER — Other Ambulatory Visit: Payer: Self-pay | Admitting: Interventional Cardiology

## 2020-01-20 DIAGNOSIS — I208 Other forms of angina pectoris: Secondary | ICD-10-CM

## 2020-01-22 ENCOUNTER — Ambulatory Visit (INDEPENDENT_AMBULATORY_CARE_PROVIDER_SITE_OTHER): Payer: Medicare Other | Admitting: Internal Medicine

## 2020-01-22 ENCOUNTER — Encounter: Payer: Self-pay | Admitting: Internal Medicine

## 2020-01-22 VITALS — BP 100/50 | HR 57 | Ht 64.0 in | Wt 143.0 lb

## 2020-01-22 DIAGNOSIS — D649 Anemia, unspecified: Secondary | ICD-10-CM | POA: Diagnosis not present

## 2020-01-22 DIAGNOSIS — R1310 Dysphagia, unspecified: Secondary | ICD-10-CM | POA: Diagnosis not present

## 2020-01-22 DIAGNOSIS — D696 Thrombocytopenia, unspecified: Secondary | ICD-10-CM | POA: Diagnosis not present

## 2020-01-22 NOTE — Patient Instructions (Signed)
Per Dr Carlean Purl cut your Isosorbide in half.  We are going to refer you to hematology.   I appreciate the opportunity to care for you. Silvano Rusk, MD, [facility]

## 2020-01-22 NOTE — Progress Notes (Signed)
[name] 74 y.o. 1945-11-19 [id number]  Assessment & Plan:   Encounter Diagnoses  Name Primary?  . Chronic anemia Yes  . Thrombocytopenia ([place])   . Pill dysphagia     Regarding the pill dysphagia she will cut her Imdur generic in half.  She was instructed not to crush this.  If it is only pill dysphagia I do not think additional GI work-up is needed.  If she develops food dysphagia again we can consider evaluation.  Her anemia is chronic and may very well be anemia of chronic disease based upon the iron studies and MCV.  The ferritin is not in a range that makes me think she has iron deficiency anemia i.e. it is well within the normal range not low normal.  I do not think there is any indication for endoscopic work-up at this time but I do think especially since she has some thrombocytopenia (admittedly mild) and hematology consultation regarding her anemia is reasonable.  We will refer.  We will keep her on her list for routine repeat colonoscopy 5 years after the last which will be 2023.  I appreciate the opportunity to care for this patient. CC: Anne Haymaker, MD     Subjective:   Chief Complaint:  HPI [name] is a 74 year old woman with a history of colon cancer resected in 5003, Nissen fundoplication with recurrent dysphagia status post 2 or more dilations, referred for evaluation of anemia and question of GI blood loss.  The patient reports she is having dysphagia to large pills we have narrowed it down to her Imdur generic.  Food is not a problem.   CBC Latest Ref Rng & Units 11/26/2019 10/30/2019 05/05/2019  WBC 3.4 - 10.8 x10E3/uL 7.9 7.5 6.6  Hemoglobin 11.1 - 15.9 g/dL 9.9(L) 9.7(L) 8.8(L)  Hematocrit 34.0 - 46.6 % 29.7(L) 30.5(L) 28.0(L)  Platelets 150 - 450 x10E3/uL 144(L) 220 241    Lab Results  Component Value Date   IRON 64 10/30/2019   TIBC 293 10/30/2019   FERRITIN 75 10/30/2019    She has a normocytic anemia that dates back to years  actually, and now has slightly low platelet count as well.  She has not had signs of bleeding.  These blood counts are stable for a number of years they were normal about 4 or 5 years ago.   She has some mild constipation at times also.  Not a change.   Last colonoscopy 2018 normal status post right hemicolectomy Last upper endoscopy May 2019 with dilation to 20 mm of the GE junction status post fundoplication.  In the past she had gotten transient 6 months benefit with dilation (2017).  Wt Readings from Last 3 Encounters:  01/22/20 143 lb (64.9 kg)  01/10/20 143 lb 12.8 oz (65.2 kg)  11/26/19 138 lb 3.2 oz (62.7 kg)    Allergies  Allergen Reactions  . Metformin And Related Diarrhea   Current Meds  Medication Sig  . amLODipine (NORVASC) 5 MG tablet Take 1 tablet (5 mg total) by mouth daily.  Marland Kitchen aspirin 81 MG chewable tablet Chew 81 mg by mouth daily.    . Blood Glucose Monitoring Suppl (ONETOUCH VERIO) w/Device KIT 1 each by Does not apply route daily.  . clopidogrel (PLAVIX) 75 MG tablet Take 1 tablet (75 mg total) by mouth daily.  . famotidine (PEPCID) 20 MG tablet Take 1 tablet (20 mg total) by mouth 2 (two) times daily.  Marland Kitchen glucose blood (ONETOUCH VERIO) test strip Use  as instructed  . Insulin Glargine (LANTUS SOLOSTAR) 100 UNIT/ML Solostar Pen Inject 15 Units into the skin daily at 10 pm.  . Insulin Pen Needle 31G X 5 MM MISC Check blood sugar once daily  . Lancet Devices (ONE TOUCH DELICA LANCING DEV) MISC Check blood sugar once daily  . nitroGLYCERIN (NITROSTAT) 0.4 MG SL tablet PLACE 1 TABLET UNDER THE TONGUE EVERY 5 MINUTES AS NEEDED. IF CHEST PAIN NOT RELIEVED BY THIRD PILL CALL 911  . quinapril-hydrochlorothiazide (ACCURETIC) 20-12.5 MG tablet Take 1 tablet by mouth daily.  . rosuvastatin (CRESTOR) 20 MG tablet Take 1 tablet (20 mg total) by mouth daily.   Past Medical History:  Diagnosis Date  . Allergy   . Anemia   . Asthma   . Blood transfusion without reported  diagnosis 12/23/2004   with colon cancer  . Brain tumor (benign) ([place])   . Colon cancer [facility]) Nov. 27,  2006  . COVID-19 04/2019  . Diabetes mellitus    Type 2  . Female bladder prolapse   . Gastritis   . GERD (gastroesophageal reflux disease)   . Glaucoma   . Hypercholesterolemia   . Hypertension    Past Surgical History:  Procedure Laterality Date  . ABDOMINAL HYSTERECTOMY  1976  . BALLOON DILATION N/A 08/23/2017   Procedure: BALLOON DILATION;  Surgeon: Milus Banister, MD;  Location: [facility];  Service: Endoscopy;  Laterality: N/A;  Esophageal Diltation  . bladder tac    . CARDIAC CATHETERIZATION  2005  . CATARACT EXTRACTION, BILATERAL    . COLONOSCOPY    . COLONOSCOPY W/ BIOPSIES    . CORONARY STENT INTERVENTION N/A 01/17/2019   Procedure: CORONARY STENT INTERVENTION;  Surgeon: Nelva Bush, MD;  Location: [place] [facility];  Service: Cardiovascular;  Laterality: N/A;  . ESOPHAGEAL MANOMETRY N/A 03/20/2015   Procedure: ESOPHAGEAL MANOMETRY (EM);  Surgeon: Gatha Mayer, MD;  Location: [facility];  Service: Endoscopy;  Laterality: N/A;  . ESOPHAGOGASTRODUODENOSCOPY (EGD) WITH PROPOFOL N/A 08/23/2017   Procedure: ESOPHAGOGASTRODUODENOSCOPY (EGD) WITH PROPOFOL;  Surgeon: Milus Banister, MD;  Location: [facility];  Service: Endoscopy;  Laterality: N/A;  . EYE SURGERY     mole removed left eye  . INTRAVASCULAR PRESSURE WIRE/FFR STUDY N/A 01/17/2019   Procedure: INTRAVASCULAR PRESSURE WIRE/FFR STUDY;  Surgeon: Nelva Bush, MD;  Location: [place] [facility];  Service: Cardiovascular;  Laterality: N/A;  . LAPAROSCOPIC NISSEN FUNDOPLICATION  3007  . LEFT HEART CATH AND CORONARY ANGIOGRAPHY N/A 04/17/2018   Procedure: LEFT HEART CATH AND CORONARY ANGIOGRAPHY;  Surgeon: Belva Crome, MD;  Location: [place] [facility];  Service: Cardiovascular;  Laterality: N/A;  . LEFT HEART CATH AND CORONARY ANGIOGRAPHY N/A 01/17/2019   Procedure: LEFT HEART CATH AND CORONARY  ANGIOGRAPHY;  Surgeon: Nelva Bush, MD;  Location: [place] [facility];  Service: Cardiovascular;  Laterality: N/A;  . repair prolapsedbladder    . RIGHT COLECTOMY  Nov. 27, 2006  . TMJ ARTHROPLASTY    . UPPER GASTROINTESTINAL ENDOSCOPY     Social History   Social History Narrative  . Not on file   family history includes Bone cancer in her father; Diabetes in her daughter; Heart attack in her daughter; Hypertension in her daughter; Lymphoma in an other family member; Suicidality in her son.   Review of Systems Notable for some decreased vision arthritis headaches itching myalgia and increased urination all other review of systems are negative  Objective:   Physical Exam BP (!) 100/50  Pulse (!) 57   Ht _0  (1.626 m)   Wt 143 lb (64.9 kg)   SpO2 97%   BMI 24.55 kg/m  No acute distress elderly black woman looking well Eyes anicteric Lungs are clear Heart sounds are normal Abdomen is soft and nontender without organomegaly or mass She is alert and oriented x3   Data reviewed as can be seen in the HPI reviewed primary care notes labs from 2021 and back, previous GI work-ups

## 2020-01-27 ENCOUNTER — Other Ambulatory Visit: Payer: Self-pay

## 2020-01-27 ENCOUNTER — Ambulatory Visit (INDEPENDENT_AMBULATORY_CARE_PROVIDER_SITE_OTHER): Payer: Medicare Other | Admitting: Student in an Organized Health Care Education/Training Program

## 2020-01-27 ENCOUNTER — Encounter: Payer: Self-pay | Admitting: Student in an Organized Health Care Education/Training Program

## 2020-01-27 VITALS — BP 144/80 | HR 66 | Wt 142.6 lb

## 2020-01-27 DIAGNOSIS — W19XXXA Unspecified fall, initial encounter: Secondary | ICD-10-CM | POA: Diagnosis not present

## 2020-01-27 DIAGNOSIS — M545 Low back pain, unspecified: Secondary | ICD-10-CM | POA: Diagnosis not present

## 2020-01-27 DIAGNOSIS — M25539 Pain in unspecified wrist: Secondary | ICD-10-CM | POA: Diagnosis not present

## 2020-01-27 MED ORDER — BACLOFEN 10 MG PO TABS
10.0000 mg | ORAL_TABLET | Freq: Three times a day (TID) | ORAL | 0 refills | Status: AC
Start: 2020-01-27 — End: 2020-02-26

## 2020-01-27 NOTE — Patient Instructions (Addendum)
It was a pleasure to see you today!  To summarize our discussion for this visit:  [facility]. Contact them for setting up PT  I have ordered Xrays that you can get done at [facility]  I have also prescribed some pain medication/muscle relaxer to take at bed time.  Please let me know if there is anything else that we need to do for your fall.  Some additional health maintenance measures we should update are: Health Maintenance Due  Topic Date Due  . TETANUS/TDAP  08/10/2006  . FOOT EXAM  08/09/2018  . OPHTHALMOLOGY EXAM  08/31/2018  . INFLUENZA VACCINE  11/10/2019  .    Call the clinic at [phone] if your symptoms worsen or you have any concerns.   Thank you for allowing me to take part in your care,  Dr. Doristine Mango

## 2020-01-27 NOTE — Progress Notes (Signed)
    SUBJECTIVE:   CHIEF COMPLAINT / HPI: fall and leg spasm  3 days ago, attempted to sit back on chair which wasn't there. Fell on tile floor. Fell on bottom and tried to brace herself with hands. Had immediate pain in her bottom and right wrist.  Took a while but able to get herself up from the floor. Since then, pain has worsened and she is walking bent over. Trouble with sitting due to pain in bottom. Tylenol is not working.  Has swelling but no bruising.  DEXA scan 2013 with normal T score.   OBJECTIVE:   BP (!) 144/80   Pulse 66   Wt 142 lb 9.6 oz (64.7 kg)   SpO2 99%   BMI 24.48 kg/m   Physical Exam: Gen: NAD, comfortable in exam room Back/pelvis/hips Observation: algesic gait. Able to get up on and off exam table without assistance. Some swelling but no bruising or redness at sacral area.  Palpation: tenderness to palpation of coccyx area. ROM: intact and painful in forward bend Special tests: neg straight leg raise, neg leg rolls.   R wrist Observation: no swelling or obvious deformity Palpation: tenderness to palpation of carpel bones ROM: full Strength: intact Sensation: intact  ASSESSMENT/PLAN:   Fall Fall from standing position with pain in coccyx and right wrist.  xrays were negative for fracture.  Attempted to call patient but no answer and unable to leave voicemail.  Patient has taken baclofen for pain previously with good results so requested this again.  Follow up if not improved with conservative management.  Recommended and ordered dexa scan, non-urgently     Richarda Osmond, [place]

## 2020-01-28 ENCOUNTER — Ambulatory Visit
Admission: RE | Admit: 2020-01-28 | Discharge: 2020-01-28 | Disposition: A | Discharge status: Home / Self Care | Payer: Medicare Other | Source: Ambulatory Visit | Attending: Family Medicine | Admitting: Family Medicine

## 2020-01-28 DIAGNOSIS — W19XXXA Unspecified fall, initial encounter: Secondary | ICD-10-CM

## 2020-01-28 DIAGNOSIS — M533 Sacrococcygeal disorders, not elsewhere classified: Secondary | ICD-10-CM | POA: Diagnosis not present

## 2020-01-28 DIAGNOSIS — M25531 Pain in right wrist: Secondary | ICD-10-CM | POA: Diagnosis not present

## 2020-01-29 ENCOUNTER — Other Ambulatory Visit: Payer: Self-pay

## 2020-01-29 ENCOUNTER — Telehealth: Payer: Self-pay | Admitting: Hematology and Oncology

## 2020-01-29 ENCOUNTER — Telehealth: Payer: Self-pay

## 2020-01-29 ENCOUNTER — Ambulatory Visit (HOSPITAL_COMMUNITY)
Admission: RE | Admit: 2020-01-29 | Discharge: 2020-01-29 | Disposition: A | Discharge status: Home / Self Care | Payer: Medicare Other | Source: Ambulatory Visit | Attending: Cardiovascular Disease | Admitting: Cardiovascular Disease

## 2020-01-29 DIAGNOSIS — I739 Peripheral vascular disease, unspecified: Secondary | ICD-10-CM | POA: Insufficient documentation

## 2020-01-29 DIAGNOSIS — W19XXXA Unspecified fall, initial encounter: Secondary | ICD-10-CM | POA: Insufficient documentation

## 2020-01-29 NOTE — Telephone Encounter (Signed)
I got a message from Seth Bake at the [facility] that they had been trying to reach [facility]. I was able to get her on the phone today and she said her grand child had erased her phone messages. She wrote down the #[phone] and she will call them now.

## 2020-01-29 NOTE — Assessment & Plan Note (Addendum)
Fall from standing position with pain in coccyx and right wrist.  xrays were negative for fracture.  Attempted to call patient but no answer and unable to leave voicemail.  Patient has taken baclofen for pain previously with good results so requested this again.  Follow up if not improved with conservative management.  Recommended and ordered dexa scan, non-urgently

## 2020-01-29 NOTE — Telephone Encounter (Signed)
Received a new hem referral from Dr. Carlean Purl for chronic anemia and thrombocytopenia. [name] has been cld and scheduled to see Dr. Alvy Bimler on 10/21 at 2pm. Pt aware to arrive 30 minutes early.

## 2020-01-30 ENCOUNTER — Encounter: Payer: Self-pay | Admitting: Hematology and Oncology

## 2020-01-30 ENCOUNTER — Inpatient Hospital Stay: Payer: Medicare Other

## 2020-01-30 ENCOUNTER — Telehealth: Payer: Self-pay | Admitting: Hematology and Oncology

## 2020-01-30 ENCOUNTER — Other Ambulatory Visit: Payer: Self-pay

## 2020-01-30 ENCOUNTER — Inpatient Hospital Stay: Payer: Medicare Other | Attending: Hematology and Oncology | Admitting: Hematology and Oncology

## 2020-01-30 ENCOUNTER — Telehealth: Payer: Self-pay | Admitting: *Deleted

## 2020-01-30 VITALS — BP 138/44 | HR 63 | Temp 97.6°F | Resp 18 | Ht 64.0 in | Wt 140.6 lb

## 2020-01-30 DIAGNOSIS — N1832 Chronic kidney disease, stage 3b: Secondary | ICD-10-CM | POA: Insufficient documentation

## 2020-01-30 DIAGNOSIS — R296 Repeated falls: Secondary | ICD-10-CM | POA: Diagnosis not present

## 2020-01-30 DIAGNOSIS — I739 Peripheral vascular disease, unspecified: Secondary | ICD-10-CM

## 2020-01-30 DIAGNOSIS — Z299 Encounter for prophylactic measures, unspecified: Secondary | ICD-10-CM

## 2020-01-30 DIAGNOSIS — Z0000 Encounter for general adult medical examination without abnormal findings: Secondary | ICD-10-CM

## 2020-01-30 DIAGNOSIS — D329 Benign neoplasm of meninges, unspecified: Secondary | ICD-10-CM | POA: Diagnosis not present

## 2020-01-30 DIAGNOSIS — D539 Nutritional anemia, unspecified: Secondary | ICD-10-CM

## 2020-01-30 DIAGNOSIS — I10 Essential (primary) hypertension: Secondary | ICD-10-CM

## 2020-01-30 DIAGNOSIS — Z23 Encounter for immunization: Secondary | ICD-10-CM | POA: Diagnosis not present

## 2020-01-30 DIAGNOSIS — N183 Chronic kidney disease, stage 3 unspecified: Secondary | ICD-10-CM

## 2020-01-30 DIAGNOSIS — E1122 Type 2 diabetes mellitus with diabetic chronic kidney disease: Secondary | ICD-10-CM | POA: Diagnosis not present

## 2020-01-30 DIAGNOSIS — I129 Hypertensive chronic kidney disease with stage 1 through stage 4 chronic kidney disease, or unspecified chronic kidney disease: Secondary | ICD-10-CM | POA: Diagnosis not present

## 2020-01-30 DIAGNOSIS — D631 Anemia in chronic kidney disease: Secondary | ICD-10-CM

## 2020-01-30 DIAGNOSIS — Z85038 Personal history of other malignant neoplasm of large intestine: Secondary | ICD-10-CM

## 2020-01-30 LAB — CBC WITH DIFFERENTIAL/PLATELET
Abs Immature Granulocytes: 0.03 10*3/uL (ref 0.00–0.07)
Basophils Absolute: 0.1 10*3/uL (ref 0.0–0.1)
Basophils Relative: 1 %
Eosinophils Absolute: 0.1 10*3/uL (ref 0.0–0.5)
Eosinophils Relative: 1 %
HCT: 32.3 % — ABNORMAL LOW (ref 36.0–46.0)
Hemoglobin: 10.3 g/dL — ABNORMAL LOW (ref 12.0–15.0)
Immature Granulocytes: 0 %
Lymphocytes Relative: 24 %
Lymphs Abs: 2 10*3/uL (ref 0.7–4.0)
MCH: 28.3 pg (ref 26.0–34.0)
MCHC: 31.9 g/dL (ref 30.0–36.0)
MCV: 88.7 fL (ref 80.0–100.0)
Monocytes Absolute: 0.5 10*3/uL (ref 0.1–1.0)
Monocytes Relative: 6 %
Neutro Abs: 5.6 10*3/uL (ref 1.7–7.7)
Neutrophils Relative %: 68 %
Platelets: 243 10*3/uL (ref 150–400)
RBC: 3.64 MIL/uL — ABNORMAL LOW (ref 3.87–5.11)
RDW: 12.6 % (ref 11.5–15.5)
WBC: 8.3 10*3/uL (ref 4.0–10.5)
nRBC: 0 % (ref 0.0–0.2)

## 2020-01-30 LAB — COMPREHENSIVE METABOLIC PANEL
ALT: 14 U/L (ref 0–44)
AST: 18 U/L (ref 15–41)
Albumin: 3.8 g/dL (ref 3.5–5.0)
Alkaline Phosphatase: 75 U/L (ref 38–126)
Anion gap: 7 (ref 5–15)
BUN: 31 mg/dL — ABNORMAL HIGH (ref 8–23)
CO2: 28 mmol/L (ref 22–32)
Calcium: 9.9 mg/dL (ref 8.9–10.3)
Chloride: 108 mmol/L (ref 98–111)
Creatinine, Ser: 1.6 mg/dL — ABNORMAL HIGH (ref 0.44–1.00)
GFR, Estimated: 34 mL/min — ABNORMAL LOW (ref >60)
Glucose, Bld: 195 mg/dL — ABNORMAL HIGH (ref 70–99)
Potassium: 3.8 mmol/L (ref 3.5–5.1)
Sodium: 143 mmol/L (ref 135–145)
Total Bilirubin: 0.6 mg/dL (ref 0.3–1.2)
Total Protein: 7.5 g/dL (ref 6.5–8.1)

## 2020-01-30 LAB — IRON AND TIBC
Iron: 86 ug/dL (ref 41–142)
Saturation Ratios: 26 % (ref 21–57)
TIBC: 326 ug/dL (ref 236–444)
UIBC: 240 ug/dL (ref 120–384)

## 2020-01-30 LAB — FERRITIN: Ferritin: 54 ng/mL (ref 11–307)

## 2020-01-30 LAB — VITAMIN B12: Vitamin B-12: 307 pg/mL (ref 180–914)

## 2020-01-30 LAB — SEDIMENTATION RATE: Sed Rate: 44 mm/hr — ABNORMAL HIGH (ref 0–22)

## 2020-01-30 MED ORDER — INFLUENZA VAC A&B SA ADJ QUAD 0.5 ML IM PRSY
0.5000 mL | PREFILLED_SYRINGE | Freq: Once | INTRAMUSCULAR | Status: AC
  Administered 2020-01-30: 0.5 mL via INTRAMUSCULAR

## 2020-01-30 MED ORDER — INFLUENZA VAC A&B SA ADJ QUAD 0.5 ML IM PRSY
PREFILLED_SYRINGE | INTRAMUSCULAR | Status: AC
  Filled 2020-01-30: qty 0.5

## 2020-01-30 NOTE — Telephone Encounter (Signed)
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  Please refer to Dr.Berry for peripheral vascular consultation for claudication. A copy will be sent to [name], MD

## 2020-01-30 NOTE — Progress Notes (Signed)
[place] NOTE  Patient Care Team: [name], MD as PCP - General Tamala Julian Lynnell Dike, MD as PCP - Cardiology (Cardiology) Calvert Cantor, MD (Ophthalmology)  CHIEF COMPLAINTS/PURPOSE OF CONSULTATION:  Chronic anemia, history of colon cancer and stable meningioma on observation She is seen today, accompanied by her granddaughter, [name].  HISTORY OF PRESENTING ILLNESS:  [name] 74 y.o. female is here because of chronic anemia The patient was seen by multiple different physicians in the past She was originally seen by Dr. Ralene Ok, and then Dr. Juliann Mule, and last seen here in 2016 by Dr. Benay Spice She has remote history of colon cancer diagnosed in 2006, stage II (T3 N0), status post a right colectomy 03/07/2005. She received adjuvant FOLFOX for 4 cycles followed by 8 cycles of 5-FU/leucovorin.  She had imaging study with CT scan of the abdomen and pelvis in 2019 which show no evidence of recurrence.  Her last colonoscopy was on July 14, 2016 which show no evidence of disease.  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  No other abnormalities were noted She also have history of meningioma and follow with neurosurgery for periodic imaging study.  Her last MRI of the brain was in Aug 11, 2015 which show stable meningioma  She was found to have abnormal CBC from recent blood work.  On review of her blood work, it is noted that she has progressive anemia over the last few years, low was around 8.8 In July of this year, she had iron studies which showed normal iron storage.  She is noted to have progressive renal disease. Starting around 2018, she had progressive renal dysfunction, with serum creatinine fluctuate usually under 2 but in January of this year, her creatinine went up to as high as 2.2. Estimated EGFR put her at chronic kidney disease stage III.  She has multiple cardiovascular  risk factors including poorly controlled diabetes, peripheral vascular disease, chronic kidney disease, coronary artery disease and hypertension  She denies recent chest pain on exertion, shortness of breath on minimal exertion, pre-syncopal episodes, or palpitations. She had not noticed any recent bleeding such as epistaxis, hematuria or hematochezia The patient denies over the counter NSAID ingestion. She is on antiplatelets agents.  She denies any pica and eats a variety of diet. She had received blood transfusion when she was diagnosed with colon cancer Her granddaughter noticed recurrent falls.  She denies generalized weakness.  She denies peripheral neuropathy from prior treatment or diabetes  MEDICAL HISTORY:  Past Medical History:  Diagnosis Date  . Allergy   . Anemia   . Asthma   . Blood transfusion without reported diagnosis 12/23/2004   with colon cancer  . Brain tumor (benign) ([place])   . Colon cancer [facility]) Nov. 27,  2006  . COVID-19 04/2019  . Diabetes mellitus    Type 2  . Female bladder prolapse   . Gastritis   . GERD (gastroesophageal reflux disease)   . Glaucoma   . Hypercholesterolemia   . Hypertension     SURGICAL HISTORY: Past Surgical History:  Procedure Laterality Date  . ABDOMINAL HYSTERECTOMY  1976  . BALLOON DILATION N/A 08/23/2017   Procedure: BALLOON DILATION;  Surgeon: Milus Banister, MD;  Location: [facility];  Service: Endoscopy;  Laterality: N/A;  Esophageal Diltation  . bladder tac    . CARDIAC CATHETERIZATION  2005  . CATARACT EXTRACTION, BILATERAL    .  COLONOSCOPY    . COLONOSCOPY W/ BIOPSIES    . CORONARY STENT INTERVENTION N/A 01/17/2019   Procedure: CORONARY STENT INTERVENTION;  Surgeon: Yvonne Kendall, MD;  Location: [facility];  Service: Cardiovascular;  Laterality: N/A;  . ESOPHAGEAL MANOMETRY N/A 03/20/2015   Procedure: ESOPHAGEAL MANOMETRY (EM);  Surgeon: Iva Boop, MD;  Location: [facility];  Service: Endoscopy;   Laterality: N/A;  . ESOPHAGOGASTRODUODENOSCOPY (EGD) WITH PROPOFOL N/A 08/23/2017   Procedure: ESOPHAGOGASTRODUODENOSCOPY (EGD) WITH PROPOFOL;  Surgeon: Rachael Fee, MD;  Location: [facility];  Service: Endoscopy;  Laterality: N/A;  . EYE SURGERY     mole removed left eye  . INTRAVASCULAR PRESSURE WIRE/FFR STUDY N/A 01/17/2019   Procedure: INTRAVASCULAR PRESSURE WIRE/FFR STUDY;  Surgeon: Yvonne Kendall, MD;  Location: [facility];  Service: Cardiovascular;  Laterality: N/A;  . LAPAROSCOPIC NISSEN FUNDOPLICATION  1997  . LEFT HEART CATH AND CORONARY ANGIOGRAPHY N/A 04/17/2018   Procedure: LEFT HEART CATH AND CORONARY ANGIOGRAPHY;  Surgeon: Lyn Records, MD;  Location: [facility];  Service: Cardiovascular;  Laterality: N/A;  . LEFT HEART CATH AND CORONARY ANGIOGRAPHY N/A 01/17/2019   Procedure: LEFT HEART CATH AND CORONARY ANGIOGRAPHY;  Surgeon: Yvonne Kendall, MD;  Location: [facility];  Service: Cardiovascular;  Laterality: N/A;  . repair prolapsedbladder    . RIGHT COLECTOMY  Nov. 27, 2006  . TMJ ARTHROPLASTY    . UPPER GASTROINTESTINAL ENDOSCOPY      SOCIAL HISTORY: Social History   Socioeconomic History  . Marital status: Legally Separated    Spouse name: Not on file  . Number of children: 2  . Years of education: Not on file  . Highest education level: Not on file  Occupational History  . Not on file  Tobacco Use  . Smoking status: Never Smoker  . Smokeless tobacco: Never Used  Vaping Use  . Vaping Use: Never used  Substance and Sexual Activity  . Alcohol use: No  . Drug use: No  . Sexual activity: Not on file  Other Topics Concern  . Not on file  Social History Narrative  . Not on file   Social Determinants of Health   Financial Resource Strain:   . Difficulty of Paying Living Expenses: Not on file  Food Insecurity:   . Worried About Programme researcher, broadcasting/film/video in the Last Year: Not on file  . Ran Out of Food in the Last Year: Not on file   Transportation Needs:   . Lack of Transportation (Medical): Not on file  . Lack of Transportation (Non-Medical): Not on file  Physical Activity:   . Days of Exercise per Week: Not on file  . Minutes of Exercise per Session: Not on file  Stress:   . Feeling of Stress : Not on file  Social Connections:   . Frequency of Communication with Friends and Family: Not on file  . Frequency of Social Gatherings with Friends and Family: Not on file  . Attends Religious Services: Not on file  . Active Member of Clubs or Organizations: Not on file  . Attends Banker Meetings: Not on file  . Marital Status: Not on file  Intimate Partner Violence:   . Fear of Current or Ex-Partner: Not on file  . Emotionally Abused: Not on file  . Physically Abused: Not on file  . Sexually Abused: Not on file    FAMILY HISTORY: Family History  Problem Relation Age of Onset  . Lymphoma  Other   . Bone cancer Father   . Diabetes Daughter   . Hypertension Daughter   . Heart attack Daughter   . Suicidality Son        deceased  . Colon cancer Neg Hx   . Stomach cancer Neg Hx     ALLERGIES:  is allergic to metformin and related.  MEDICATIONS:  Current Outpatient Medications  Medication Sig Dispense Refill  . amLODipine (NORVASC) 5 MG tablet Take 1 tablet (5 mg total) by mouth daily. 90 tablet 3  . aspirin 81 MG chewable tablet Chew 81 mg by mouth daily.      . baclofen (LIORESAL) 10 MG tablet Take 1 tablet (10 mg total) by mouth 3 (three) times daily. 90 tablet 0  . Blood Glucose Monitoring Suppl (ONETOUCH VERIO) w/Device KIT 1 each by Does not apply route daily. 1 kit 0  . clopidogrel (PLAVIX) 75 MG tablet Take 1 tablet (75 mg total) by mouth daily. 90 tablet 2  . famotidine (PEPCID) 20 MG tablet Take 1 tablet (20 mg total) by mouth 2 (two) times daily. 30 tablet 3  . glucose blood (ONETOUCH VERIO) test strip Use as instructed 100 each 12  . Insulin Glargine (LANTUS SOLOSTAR) 100 UNIT/ML  Solostar Pen Inject 15 Units into the skin daily at 10 pm. 5 pen 12  . Insulin Pen Needle 31G X 5 MM MISC Check blood sugar once daily 100 each 2  . isosorbide mononitrate (IMDUR) 60 MG 24 hr tablet Take 1 tablet (60 mg total) by mouth daily. 90 tablet 3  . Lancet Devices (ONE TOUCH DELICA LANCING DEV) MISC Check blood sugar once daily 1 each 0  . nitroGLYCERIN (NITROSTAT) 0.4 MG SL tablet PLACE 1 TABLET UNDER THE TONGUE EVERY 5 MINUTES AS NEEDED. IF CHEST PAIN NOT RELIEVED BY THIRD PILL CALL 911 25 tablet 7  . quinapril-hydrochlorothiazide (ACCURETIC) 20-12.5 MG tablet Take 1 tablet by mouth daily. 90 tablet 3  . rosuvastatin (CRESTOR) 20 MG tablet Take 1 tablet (20 mg total) by mouth daily. 90 tablet 3   No current facility-administered medications for this visit.    REVIEW OF SYSTEMS:   Constitutional: Denies fevers, chills or abnormal night sweats Eyes: Denies blurriness of vision, double vision or watery eyes Ears, nose, mouth, throat, and face: Denies mucositis or sore throat Respiratory: Denies cough, dyspnea or wheezes Cardiovascular: Denies palpitation, chest discomfort or lower extremity swelling Gastrointestinal:  Denies nausea, heartburn or change in bowel habits Skin: Denies abnormal skin rashes Lymphatics: Denies new lymphadenopathy or easy bruising Neurological:Denies numbness, tingling or new weaknesses Behavioral/Psych: Mood is stable, no new changes  All other systems were reviewed with the patient and are negative.  PHYSICAL EXAMINATION: ECOG PERFORMANCE STATUS: 1 - Symptomatic but completely ambulatory  Vitals:   01/30/20 1345  BP: (!) 138/44  Pulse: 63  Resp: 18  Temp: 97.6 F (36.4 C)  SpO2: 100%   Filed Weights   01/30/20 1345  Weight: 140 lb 9.6 oz (63.8 kg)    GENERAL:alert, no distress and comfortable SKIN: skin color, texture, turgor are normal, no rashes or significant lesions EYES: normal, conjunctiva are pink and non-injected, sclera  clear OROPHARYNX:no exudate, no erythema and lips, buccal mucosa, and tongue normal  NECK: supple, thyroid normal size, non-tender, without nodularity LYMPH:  no palpable lymphadenopathy in the cervical, axillary or inguinal LUNGS: clear to auscultation and percussion with normal breathing effort HEART: regular rate & rhythm and no murmurs and no lower extremity edema  ABDOMEN:abdomen soft, non-tender and normal bowel sounds Musculoskeletal:no cyanosis of digits and no clubbing  PSYCH: alert & oriented x 3 with fluent speech NEURO: no focal motor/sensory deficits  RADIOGRAPHIC STUDIES: I have personally reviewed the radiological images as listed and agreed with the findings in the report. DG Pelvis 1-2 Views  Result Date: 01/28/2020 CLINICAL DATA:  Recent fall with pelvic pain, initial encounter EXAM: PELVIS - 1-2 VIEW COMPARISON:  11/26/2019 FINDINGS: Pelvic ring is intact. No acute fracture is noted. InterStim device is noted. No soft tissue abnormality is seen. IMPRESSION: No acute abnormality noted. Electronically Signed   By: Inez Catalina M.D.   On: 01/28/2020 20:30   DG Sacrum/Coccyx  Result Date: 01/28/2020 CLINICAL DATA:  Recent fall with sacral pain, initial encounter EXAM: SACRUM AND COCCYX - 2+ VIEW COMPARISON:  None. FINDINGS: Pelvic ring is intact. InterStim device is noted. Sacral ala are unremarkable. No acute fracture is noted. IMPRESSION: No acute abnormality noted. Electronically Signed   By: Inez Catalina M.D.   On: 01/28/2020 20:28   DG Wrist Complete Right  Result Date: 01/28/2020 CLINICAL DATA:  Recent fall with right wrist pain, initial encounter EXAM: RIGHT WRIST - COMPLETE 3+ VIEW COMPARISON:  None. FINDINGS: No acute fracture or dislocation is noted. Tiny density is noted in the volar soft tissues possibly related to prior foreign body. No other focal abnormality is noted. IMPRESSION: No acute fracture is noted. Likely chronic soft tissue calcification. Correlate  with any history of prior foreign body. Electronically Signed   By: Inez Catalina M.D.   On: 01/28/2020 20:33   VAS [place] LE ART SEG MULTI (Segm&LE Reynauds)  Result Date: 01/29/2020 LOWER EXTREMITY DOPPLER STUDY Indications: Patient reports left hip/leg pain with and without walking. She              denies any rest pain. High Risk Factors: Hypertension, hyperlipidemia, Diabetes, no history of                    smoking, coronary artery disease.  Comparison Study: NA Performing Technologist: Sharlett Iles RVT  Examination Guidelines: A complete evaluation includes at minimum, Doppler waveform signals and systolic blood pressure reading at the level of bilateral brachial, anterior tibial, and posterior tibial arteries, when vessel segments are accessible. Bilateral testing is considered an integral part of a complete examination. Photoelectric Plethysmograph (PPG) waveforms and toe systolic pressure readings are included as required and additional duplex testing as needed. Limited examinations for reoccurring indications may be performed as noted.  ABI Findings: +---------+------------------+-----+----------------+--------+ Right    Rt Pressure (mmHg)IndexWaveform        Comment  +---------+------------------+-----+----------------+--------+ Brachial 152                                             +---------+------------------+-----+----------------+--------+ CFA                             barely triphasic         +---------+------------------+-----+----------------+--------+ Popliteal                       barely triphasic         +---------+------------------+-----+----------------+--------+ ATA      150               0.96 barely triphasic         +---------+------------------+-----+----------------+--------+  PTA      171               1.09 triphasic                +---------+------------------+-----+----------------+--------+ PERO     142               0.90 barely  triphasic         +---------+------------------+-----+----------------+--------+ Great Toe116               0.74 Normal                   +---------+------------------+-----+----------------+--------+ +---------+------------------+-----+---------+-------+ Left     Lt Pressure (mmHg)IndexWaveform Comment +---------+------------------+-----+---------+-------+ Brachial 157                                     +---------+------------------+-----+---------+-------+ CFA                             biphasic         +---------+------------------+-----+---------+-------+ Popliteal                       biphasic         +---------+------------------+-----+---------+-------+ ATA      140               0.89 triphasic        +---------+------------------+-----+---------+-------+ PTA      136               0.87 triphasic        +---------+------------------+-----+---------+-------+ PERO     138               0.88 triphasic        +---------+------------------+-----+---------+-------+ Great Toe102               0.65 Normal           +---------+------------------+-----+---------+-------+ +-------+-----------+-----------+------------+------------+ ABI/TBIToday's ABIToday's TBIPrevious ABIPrevious TBI +-------+-----------+-----------+------------+------------+ Right  1.09       .74                                 +-------+-----------+-----------+------------+------------+ Left   .89        .65                                 +-------+-----------+-----------+------------+------------+   Summary: Right: Resting right ankle-brachial index is within normal range. No evidence of significant right lower extremity arterial disease. The right toe-brachial index is normal. Left: Resting left ankle-brachial index indicates mild left lower extremity arterial disease. The left toe-brachial index is abnormal.  *See table(s) above for measurements and observations. See LE  Arterial duplex report. Vascular consult recommended. Electronically signed by Tobias Alexander MD on 01/29/2020 at 9:43:26 PM.    Final    VAS [place] LOWER EXTREMITY ARTERIAL DUPLEX  Result Date: 01/29/2020 LOWER EXTREMITY ARTERIAL DUPLEX STUDY Indications: Patient reports left hip/leg pain with and without walking. She              denies any rest pain. High Risk Factors: Hypertension, hyperlipidemia, Diabetes, no history of                    smoking, coronary  artery disease.  Current ABI: 1.09 on the right and .89 on the left Comparison Study: NA Performing Technologist: Sharlett Iles RVT  Examination Guidelines: A complete evaluation includes B-mode imaging, spectral Doppler, color Doppler, and power Doppler as needed of all accessible portions of each vessel. Bilateral testing is considered an integral part of a complete examination. Limited examinations for reoccurring indications may be performed as noted.  +-----------+--------+-----+---------------+---------+-------------------------+ RIGHT      PSV cm/sRatioStenosis       Waveform Comments                  +-----------+--------+-----+---------------+---------+-------------------------+ CFA Prox   125                         biphasic                           +-----------+--------+-----+---------------+---------+-------------------------+ DFA        255                         triphasic                          +-----------+--------+-----+---------------+---------+-------------------------+ SFA Prox   179          30-49% stenosisbiphasic distal to the ostium      +-----------+--------+-----+---------------+---------+-------------------------+ SFA Mid    136                         biphasic                           +-----------+--------+-----+---------------+---------+-------------------------+ SFA Distal 408     3.6  50-74% stenosisbiphasic turbulent; high end range  +-----------+--------+-----+---------------+---------+-------------------------+ POP Prox   108                         biphasic                           +-----------+--------+-----+---------------+---------+-------------------------+ POP Mid    133                         biphasic                           +-----------+--------+-----+---------------+---------+-------------------------+ POP Distal 74                          biphasic                           +-----------+--------+-----+---------------+---------+-------------------------+ TP Trunk   60                          biphasic                           +-----------+--------+-----+---------------+---------+-------------------------+ ATA Distal 22                          biphasic                           +-----------+--------+-----+---------------+---------+-------------------------+  PTA Distal 50                          biphasic                           +-----------+--------+-----+---------------+---------+-------------------------+ PERO Distal49                          biphasic                           +-----------+--------+-----+---------------+---------+-------------------------+ A focal velocity elevation of 408 cm/s was obtained at distal SFA with post stenotic turbulence with a VR of 3.6. Findings are characteristic of 50-74% stenosis.  +-----------+--------+-----+---------------+----------------+------------------+ LEFT       PSV cm/sRatioStenosis       Waveform        Comments           +-----------+--------+-----+---------------+----------------+------------------+ CFA Prox   171          30-49% stenosisbarely triphasic                   +-----------+--------+-----+---------------+----------------+------------------+ DFA        210                         barely triphasic                   +-----------+--------+-----+---------------+----------------+------------------+ SFA  Prox   226     2.4  50-74% stenosisbiphasic        distal to the                                                             ostium             +-----------+--------+-----+---------------+----------------+------------------+ SFA Mid    502     4.0  75-99% stenosisbiphasic        turbulent          +-----------+--------+-----+---------------+----------------+------------------+ SFA Distal 85                          biphasic                           +-----------+--------+-----+---------------+----------------+------------------+ POP Prox   144                         biphasic                           +-----------+--------+-----+---------------+----------------+------------------+ POP Mid    141                         biphasic                           +-----------+--------+-----+---------------+----------------+------------------+ POP Distal 87                          biphasic                           +-----------+--------+-----+---------------+----------------+------------------+  TP Trunk   41                          biphasic                           +-----------+--------+-----+---------------+----------------+------------------+ ATA Distal 0            occluded                       reconstitutes                                                             further distally   +-----------+--------+-----+---------------+----------------+------------------+ PTA Distal 39                          biphasic                           +-----------+--------+-----+---------------+----------------+------------------+ PERO Distal49                          biphasic                           +-----------+--------+-----+---------------+----------------+------------------+ A focal velocity elevation of 226 cm/s was obtained at proximal SFA distal to the ostium with post stenotic turbulence with a VR of 2.4. Findings are characteristic of  50-74% stenosis. A 2nd focal velocity elevation was visualized, measuring 502 cm/s at mid SFA with post stenotic turbulence with a VR of 4.05. Findings are characteristic of 75-99% stenosis.  Summary: Right: Heterogenous plaque throughout. 30-49% stenosis in the proximal SFA distal to the ostium. 50-74% stenosis in the distal SFA, high end range.  Left: Heterogenous plaque throughout. 30-49% stenosis in the proximal CFA. 50-74% stenosis in the proximal SFA distal to the ostium. 75-99% stenosis in the mid SFA. Distal ATA occlusion with reconstitution of flow further distally.  See table(s) above for measurements and observations. See ABI report. Vascular consult recommended. Electronically signed by Tobias Alexander MD on 01/29/2020 at 9:39:25 PM.    Final     ASSESSMENT & PLAN:  Deficiency anemia She has multifactorial anemia, most likely anemia of chronic kidney disease Nutritional causes cannot be excluded I will order additional work-up I think she could benefit from darbepoetin injection I will see her back in 2 weeks for further follow-up  Chronic kidney disease, stage III (moderate) (HCC) She has intermittent acute on chronic renal failure We discussed the importance of close monitoring and follow-up by nephrologist along with risk factor modifications   History of colon cancer She is a long-term cancer survivor.  It has been 15 years since she was diagnosed Her prior colonoscopy and CT imaging performed recently were within normal limits She does not need further imaging studies in this regard  HYPERTENSION, BENIGN ESSENTIAL She has chronic hypertension Darbepoetin injection can exacerbate hypertension We will recheck her blood pressure again in the next visit before darbepoetin injection  Meningioma (HCC) Her recent MRI of the brain performed 2 years ago showed no changes I do not believe this is a contributing factor  to her fall  Recurrent falls She has recurrent falls She can  benefit from neurology consultation and follow-up I will discuss with her in her next visit  Preventive measure We discussed the importance of preventive care and reviewed the vaccination programs. She does not have any prior allergic reactions to influenza vaccination. She agrees to proceed with influenza vaccination today and we will administer it today at the clinic.   Orders Placed This Encounter  Procedures  . CBC with Differential/Platelet    Standing Status:   Standing    Number of Occurrences:   22    Standing Expiration Date:   01/29/2021  . Comprehensive metabolic panel    Standing Status:   Standing    Number of Occurrences:   33    Standing Expiration Date:   01/29/2021  . Ferritin    Standing Status:   Future    Number of Occurrences:   1    Standing Expiration Date:   01/29/2021  . Iron and TIBC    Standing Status:   Future    Number of Occurrences:   1    Standing Expiration Date:   01/29/2021  . Sedimentation rate    Standing Status:   Future    Number of Occurrences:   1    Standing Expiration Date:   01/29/2021  . Vitamin B12    Standing Status:   Future    Number of Occurrences:   1    Standing Expiration Date:   01/29/2021  . Folate RBC    Standing Status:   Future    Number of Occurrences:   1    Standing Expiration Date:   01/29/2021  . Erythropoietin    Standing Status:   Future    Number of Occurrences:   1    Standing Expiration Date:   01/29/2021     All questions were answered. The patient knows to call the clinic with any problems, questions or concerns.  The total time spent in the appointment was 60 minutes encounter with patients including review of chart and various tests results, discussions about plan of care and coordination of care plan  Heath Lark, MD 10/21/20213:21 PM       Heath Lark, MD 01/30/20 3:21 PM

## 2020-01-30 NOTE — Assessment & Plan Note (Signed)
We discussed the importance of preventive care and reviewed the vaccination programs. She does not have any prior allergic reactions to influenza vaccination. She agrees to proceed with influenza vaccination today and we will administer it today at the clinic.  

## 2020-01-30 NOTE — Assessment & Plan Note (Signed)
She has recurrent falls She can benefit from neurology consultation and follow-up I will discuss with her in her next visit

## 2020-01-30 NOTE — Assessment & Plan Note (Signed)
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

## 2020-01-30 NOTE — Assessment & Plan Note (Signed)
Her recent MRI of the brain performed 2 years ago showed no changes I do not believe this is a contributing factor to her fall

## 2020-01-30 NOTE — Patient Instructions (Signed)

## 2020-01-30 NOTE — Addendum Note (Signed)
Addended by: Gaetano Net on: 01/30/2020 10:15 AM   Modules accepted: Orders

## 2020-01-30 NOTE — Assessment & Plan Note (Signed)
She has intermittent acute on chronic renal failure We discussed the importance of close monitoring and follow-up by nephrologist along with risk factor modifications

## 2020-01-30 NOTE — Assessment & Plan Note (Signed)
She has chronic hypertension Darbepoetin injection can exacerbate hypertension We will recheck her blood pressure again in the next visit before darbepoetin injection

## 2020-01-30 NOTE — Assessment & Plan Note (Signed)
She is a long-term cancer survivor.  It has been 15 years since she was diagnosed Her prior colonoscopy and CT imaging performed recently were within normal limits She does not need further imaging studies in this regard

## 2020-01-30 NOTE — Telephone Encounter (Signed)
Scheduled appointment per 10/21 scheduling message. Spoke to patient who is aware of appointments. Gave patient calendar print out.

## 2020-01-31 LAB — FOLATE RBC
Folate, Hemolysate: 360 ng/mL
Folate, RBC: 1129 ng/mL (ref >498)
Hematocrit: 31.9 % — ABNORMAL LOW (ref 34.0–46.6)

## 2020-01-31 LAB — ERYTHROPOIETIN: Erythropoietin: 13.9 m[IU]/mL (ref 2.6–18.5)

## 2020-02-03 ENCOUNTER — Other Ambulatory Visit: Payer: Self-pay | Admitting: Hematology and Oncology

## 2020-02-10 ENCOUNTER — Inpatient Hospital Stay: Payer: Medicare Other | Attending: Hematology and Oncology

## 2020-02-10 ENCOUNTER — Inpatient Hospital Stay: Payer: Medicare Other

## 2020-02-10 ENCOUNTER — Other Ambulatory Visit: Payer: Self-pay

## 2020-02-10 ENCOUNTER — Inpatient Hospital Stay (HOSPITAL_BASED_OUTPATIENT_CLINIC_OR_DEPARTMENT_OTHER): Payer: Medicare Other | Admitting: Hematology and Oncology

## 2020-02-10 DIAGNOSIS — Z85038 Personal history of other malignant neoplasm of large intestine: Secondary | ICD-10-CM

## 2020-02-10 DIAGNOSIS — D631 Anemia in chronic kidney disease: Secondary | ICD-10-CM | POA: Insufficient documentation

## 2020-02-10 DIAGNOSIS — D539 Nutritional anemia, unspecified: Secondary | ICD-10-CM

## 2020-02-10 DIAGNOSIS — N183 Chronic kidney disease, stage 3 unspecified: Secondary | ICD-10-CM | POA: Insufficient documentation

## 2020-02-10 DIAGNOSIS — I10 Essential (primary) hypertension: Secondary | ICD-10-CM | POA: Insufficient documentation

## 2020-02-10 LAB — COMPREHENSIVE METABOLIC PANEL
ALT: 13 U/L (ref 0–44)
AST: 21 U/L (ref 15–41)
Albumin: 3.6 g/dL (ref 3.5–5.0)
Alkaline Phosphatase: 66 U/L (ref 38–126)
Anion gap: 5 (ref 5–15)
BUN: 43 mg/dL — ABNORMAL HIGH (ref 8–23)
CO2: 28 mmol/L (ref 22–32)
Calcium: 9.8 mg/dL (ref 8.9–10.3)
Chloride: 107 mmol/L (ref 98–111)
Creatinine, Ser: 1.73 mg/dL — ABNORMAL HIGH (ref 0.44–1.00)
GFR, Estimated: 31 mL/min — ABNORMAL LOW (ref >60)
Glucose, Bld: 203 mg/dL — ABNORMAL HIGH (ref 70–99)
Potassium: 3.9 mmol/L (ref 3.5–5.1)
Sodium: 140 mmol/L (ref 135–145)
Total Bilirubin: 0.5 mg/dL (ref 0.3–1.2)
Total Protein: 7.1 g/dL (ref 6.5–8.1)

## 2020-02-10 LAB — CBC WITH DIFFERENTIAL/PLATELET
Abs Immature Granulocytes: 0.02 10*3/uL (ref 0.00–0.07)
Basophils Absolute: 0 10*3/uL (ref 0.0–0.1)
Basophils Relative: 0 %
Eosinophils Absolute: 0.1 10*3/uL (ref 0.0–0.5)
Eosinophils Relative: 2 %
HCT: 29 % — ABNORMAL LOW (ref 36.0–46.0)
Hemoglobin: 9.4 g/dL — ABNORMAL LOW (ref 12.0–15.0)
Immature Granulocytes: 0 %
Lymphocytes Relative: 33 %
Lymphs Abs: 2.6 10*3/uL (ref 0.7–4.0)
MCH: 28.7 pg (ref 26.0–34.0)
MCHC: 32.4 g/dL (ref 30.0–36.0)
MCV: 88.4 fL (ref 80.0–100.0)
Monocytes Absolute: 0.6 10*3/uL (ref 0.1–1.0)
Monocytes Relative: 8 %
Neutro Abs: 4.6 10*3/uL (ref 1.7–7.7)
Neutrophils Relative %: 57 %
Platelets: 216 10*3/uL (ref 150–400)
RBC: 3.28 MIL/uL — ABNORMAL LOW (ref 3.87–5.11)
RDW: 12.7 % (ref 11.5–15.5)
WBC: 7.9 10*3/uL (ref 4.0–10.5)
nRBC: 0 % (ref 0.0–0.2)

## 2020-02-10 MED ORDER — EPOETIN ALFA-EPBX 10000 UNIT/ML IJ SOLN
INTRAMUSCULAR | Status: AC
  Filled 2020-02-10: qty 1

## 2020-02-10 MED ORDER — EPOETIN ALFA-EPBX 10000 UNIT/ML IJ SOLN
10000.0000 [IU] | Freq: Once | INTRAMUSCULAR | Status: AC
  Administered 2020-02-10: 10000 [IU] via SUBCUTANEOUS

## 2020-02-10 NOTE — Patient Instructions (Signed)

## 2020-02-11 ENCOUNTER — Encounter: Payer: Self-pay | Admitting: Hematology and Oncology

## 2020-02-11 NOTE — Assessment & Plan Note (Signed)
She has intermittent acute on chronic renal failure We discussed the importance of close monitoring and follow-up by nephrologist along with risk factor modifications

## 2020-02-11 NOTE — Assessment & Plan Note (Signed)
She is a long-term cancer survivor.  It has been 15 years since she was diagnosed Her prior colonoscopy and CT imaging performed recently were within normal limits She does not need further imaging studies in this regard

## 2020-02-11 NOTE — Progress Notes (Signed)
[facility] OFFICE PROGRESS NOTE  Patient Care Team: [name], MD as PCP - General Tamala Julian Lynnell Dike, MD as PCP - Cardiology (Cardiology) Calvert Cantor, MD (Ophthalmology)  ASSESSMENT & PLAN:  Deficiency anemia The cause of her anemia is likely due to anemia chronic kidney disease  This is likely anemia of chronic disease. The patient denies recent history of bleeding such as epistaxis, hematuria or hematochezia. She is symptomatic from the anemia.  We discussed the risks, benefits, side effects of erythropoietin stimulating agents for anemia, with the goal of keeping the hemoglobin greater than 11 g. I discussed with the patient and potential side effects such as risk of thrombosis, severe hypertension, risk of congestive heart failure and stroke and she agreed to proceed. We will reassess her response rates after 3 treatments to assess whether dosage adjustment is needed. I will see the patient back in 3 months.    Chronic kidney disease, stage III (moderate) (HCC) She has intermittent acute on chronic renal failure We discussed the importance of close monitoring and follow-up by nephrologist along with risk factor modifications   HYPERTENSION, BENIGN ESSENTIAL Her blood pressure will be monitored closely while she is on Aranesp  History of colon cancer She is a long-term cancer survivor.  It has been 15 years since she was diagnosed Her prior colonoscopy and CT imaging performed recently were within normal limits She does not need further imaging studies in this regard   No orders of the defined types were placed in this encounter.   All questions were answered. The patient knows to call the clinic with any problems, questions or concerns. The total time spent in the appointment was 20 minutes encounter with patients including review of chart and various tests results, discussions about plan of care and coordination of care plan   Heath Lark, MD 02/11/2020  11:05 AM  INTERVAL HISTORY: Please see below for problem oriented charting. She returns with her 2 granddaughters for further follow-up  SUMMARY OF ONCOLOGIC HISTORY: The patient was seen by multiple different physicians in the past She was originally seen by Dr. Ralene Ok, and then Dr. Juliann Mule, and last seen here in 2016 by Dr. Benay Spice She has remote history of colon cancer diagnosed in 2006, stage II (T3 N0), status post a right colectomy 03/07/2005. She received adjuvant FOLFOX for 4 cycles followed by 8 cycles of 5-FU/leucovorin.  She had imaging study with CT scan of the abdomen and pelvis in 2019 which show no evidence of recurrence.  Her last colonoscopy was on July 14, 2016 which show no evidence of disease.  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  No other abnormalities were noted She also have history of meningioma and follow with neurosurgery for periodic imaging study.  Her last MRI of the brain was in Aug 11, 2015 which show stable meningioma  She was found to have abnormal CBC from recent blood work.  On review of her blood work, it is noted that she has progressive anemia over the last few years, low was around 8.8 In July of this year, she had iron studies which showed normal iron storage.  She is noted to have progressive renal disease. Starting around 2018, she had progressive renal dysfunction, with serum creatinine fluctuate usually under 2 but in January of this year, her creatinine went up to as high as 2.2. Estimated EGFR put her at chronic  kidney disease stage III.  She has multiple cardiovascular risk factors including poorly controlled diabetes, peripheral vascular disease, chronic kidney disease, coronary artery disease and hypertension  She denies recent chest pain on exertion, shortness of breath on minimal exertion, pre-syncopal episodes, or palpitations. She had not noticed any recent  bleeding such as epistaxis, hematuria or hematochezia The patient denies over the counter NSAID ingestion. She is on antiplatelets agents.  She denies any pica and eats a variety of diet. She had received blood transfusion when she was diagnosed with colon cancer Her granddaughter noticed recurrent falls.  She denies generalized weakness.  She denies peripheral neuropathy from prior treatment or diabetes She is started on darbepoetin injection for anemia chronic kidney disease on February 10, 2020  REVIEW OF SYSTEMS:   Constitutional: Denies fevers, chills or abnormal weight loss Eyes: Denies blurriness of vision Ears, nose, mouth, throat, and face: Denies mucositis or sore throat Respiratory: Denies cough, dyspnea or wheezes Cardiovascular: Denies palpitation, chest discomfort or lower extremity swelling Gastrointestinal:  Denies nausea, heartburn or change in bowel habits Skin: Denies abnormal skin rashes Lymphatics: Denies new lymphadenopathy or easy bruising Neurological:Denies numbness, tingling or new weaknesses Behavioral/Psych: Mood is stable, no new changes  All other systems were reviewed with the patient and are negative.  I have reviewed the past medical history, past surgical history, social history and family history with the patient and they are unchanged from previous note.  ALLERGIES:  is allergic to metformin and related.  MEDICATIONS:  Current Outpatient Medications  Medication Sig Dispense Refill  . amLODipine (NORVASC) 5 MG tablet Take 1 tablet (5 mg total) by mouth daily. 90 tablet 3  . aspirin 81 MG chewable tablet Chew 81 mg by mouth daily.      . baclofen (LIORESAL) 10 MG tablet Take 1 tablet (10 mg total) by mouth 3 (three) times daily. 90 tablet 0  . Blood Glucose Monitoring Suppl (ONETOUCH VERIO) w/Device KIT 1 each by Does not apply route daily. 1 kit 0  . clopidogrel (PLAVIX) 75 MG tablet Take 1 tablet (75 mg total) by mouth daily. 90 tablet 2  .  famotidine (PEPCID) 20 MG tablet Take 1 tablet (20 mg total) by mouth 2 (two) times daily. 30 tablet 3  . glucose blood (ONETOUCH VERIO) test strip Use as instructed 100 each 12  . Insulin Glargine (LANTUS SOLOSTAR) 100 UNIT/ML Solostar Pen Inject 15 Units into the skin daily at 10 pm. 5 pen 12  . Insulin Pen Needle 31G X 5 MM MISC Check blood sugar once daily 100 each 2  . isosorbide mononitrate (IMDUR) 60 MG 24 hr tablet Take 1 tablet (60 mg total) by mouth daily. 90 tablet 3  . Lancet Devices (ONE TOUCH DELICA LANCING DEV) MISC Check blood sugar once daily 1 each 0  . nitroGLYCERIN (NITROSTAT) 0.4 MG SL tablet PLACE 1 TABLET UNDER THE TONGUE EVERY 5 MINUTES AS NEEDED. IF CHEST PAIN NOT RELIEVED BY THIRD PILL CALL 911 25 tablet 7  . quinapril-hydrochlorothiazide (ACCURETIC) 20-12.5 MG tablet Take 1 tablet by mouth daily. 90 tablet 3  . rosuvastatin (CRESTOR) 20 MG tablet Take 1 tablet (20 mg total) by mouth daily. 90 tablet 3   No current facility-administered medications for this visit.    PHYSICAL EXAMINATION: ECOG PERFORMANCE STATUS: 1 - Symptomatic but completely ambulatory  Vitals:   02/10/20 1148  BP: (!) 144/70  Pulse: 60  Resp: 17  Temp: 98 F (36.7 C)  SpO2: 100%  Filed Weights   02/10/20 1148  Weight: 144 lb 3.2 oz (65.4 kg)    GENERAL:alert, no distress and comfortable NEURO: alert & oriented x 3 with fluent speech, no focal motor/sensory deficits  LABORATORY DATA:  I have reviewed the data as listed    Component Value Date/Time   NA 140 02/10/2020 1127   NA 137 11/26/2019 1100   NA 143 05/15/2014 1154   K 3.9 02/10/2020 1127   K 3.6 05/15/2014 1154   CL 107 02/10/2020 1127   CL 101 05/11/2012 1544   CO2 28 02/10/2020 1127   CO2 24 05/15/2014 1154   GLUCOSE 203 (H) 02/10/2020 1127   GLUCOSE 143 (H) 05/15/2014 1154   GLUCOSE 272 (H) 05/11/2012 1544   BUN 43 (H) 02/10/2020 1127   BUN 36 (H) 11/26/2019 1100   BUN 17.3 05/15/2014 1154   CREATININE 1.73  (H) 02/10/2020 1127   CREATININE 1.06 (H) 07/20/2015 1641   CREATININE 1.0 05/15/2014 1154   CALCIUM 9.8 02/10/2020 1127   CALCIUM 9.6 05/15/2014 1154   PROT 7.1 02/10/2020 1127   PROT 7.0 05/15/2014 1154   ALBUMIN 3.6 02/10/2020 1127   ALBUMIN 3.7 05/15/2014 1154   AST 21 02/10/2020 1127   AST 13 05/15/2014 1154   ALT 13 02/10/2020 1127   ALT 9 05/15/2014 1154   ALKPHOS 66 02/10/2020 1127   ALKPHOS 94 05/15/2014 1154   BILITOT 0.5 02/10/2020 1127   BILITOT 0.68 05/15/2014 1154   GFRNONAA 31 (L) 02/10/2020 1127   GFRNONAA 54 (L) 07/20/2015 1641   GFRAA 35 (L) 11/26/2019 1100   GFRAA 62 07/20/2015 1641    No results found for: SPEP, UPEP  Lab Results  Component Value Date   WBC 7.9 02/10/2020   NEUTROABS 4.6 02/10/2020   HGB 9.4 (L) 02/10/2020   HCT 29.0 (L) 02/10/2020   MCV 88.4 02/10/2020   PLT 216 02/10/2020      Chemistry      Component Value Date/Time   NA 140 02/10/2020 1127   NA 137 11/26/2019 1100   NA 143 05/15/2014 1154   K 3.9 02/10/2020 1127   K 3.6 05/15/2014 1154   CL 107 02/10/2020 1127   CL 101 05/11/2012 1544   CO2 28 02/10/2020 1127   CO2 24 05/15/2014 1154   BUN 43 (H) 02/10/2020 1127   BUN 36 (H) 11/26/2019 1100   BUN 17.3 05/15/2014 1154   CREATININE 1.73 (H) 02/10/2020 1127   CREATININE 1.06 (H) 07/20/2015 1641   CREATININE 1.0 05/15/2014 1154      Component Value Date/Time   CALCIUM 9.8 02/10/2020 1127   CALCIUM 9.6 05/15/2014 1154   ALKPHOS 66 02/10/2020 1127   ALKPHOS 94 05/15/2014 1154   AST 21 02/10/2020 1127   AST 13 05/15/2014 1154   ALT 13 02/10/2020 1127   ALT 9 05/15/2014 1154   BILITOT 0.5 02/10/2020 1127   BILITOT 0.68 05/15/2014 1154

## 2020-02-11 NOTE — Assessment & Plan Note (Signed)
The cause of her anemia is likely due to anemia chronic kidney disease  This is likely anemia of chronic disease. The patient denies recent history of bleeding such as epistaxis, hematuria or hematochezia. She is symptomatic from the anemia.  We discussed the risks, benefits, side effects of erythropoietin stimulating agents for anemia, with the goal of keeping the hemoglobin greater than 11 g. I discussed with the patient and potential side effects such as risk of thrombosis, severe hypertension, risk of congestive heart failure and stroke and she agreed to proceed. We will reassess her response rates after 3 treatments to assess whether dosage adjustment is needed. I will see the patient back in 3 months.

## 2020-02-11 NOTE — Assessment & Plan Note (Signed)
Her blood pressure will be monitored closely while she is on Aranesp

## 2020-02-21 ENCOUNTER — Other Ambulatory Visit: Payer: Self-pay | Admitting: Nurse Practitioner

## 2020-02-23 ENCOUNTER — Other Ambulatory Visit: Payer: Self-pay | Admitting: Student in an Organized Health Care Education/Training Program

## 2020-03-02 ENCOUNTER — Inpatient Hospital Stay: Payer: Medicare Other

## 2020-03-02 ENCOUNTER — Other Ambulatory Visit: Payer: Self-pay

## 2020-03-02 VITALS — BP 143/52 | HR 56 | Resp 18

## 2020-03-02 DIAGNOSIS — D539 Nutritional anemia, unspecified: Secondary | ICD-10-CM

## 2020-03-02 DIAGNOSIS — E1165 Type 2 diabetes mellitus with hyperglycemia: Secondary | ICD-10-CM

## 2020-03-02 DIAGNOSIS — Z85038 Personal history of other malignant neoplasm of large intestine: Secondary | ICD-10-CM | POA: Diagnosis not present

## 2020-03-02 DIAGNOSIS — D631 Anemia in chronic kidney disease: Secondary | ICD-10-CM | POA: Diagnosis not present

## 2020-03-02 DIAGNOSIS — N183 Chronic kidney disease, stage 3 unspecified: Secondary | ICD-10-CM

## 2020-03-02 DIAGNOSIS — N1832 Chronic kidney disease, stage 3b: Secondary | ICD-10-CM | POA: Diagnosis not present

## 2020-03-02 DIAGNOSIS — C189 Malignant neoplasm of colon, unspecified: Secondary | ICD-10-CM | POA: Diagnosis not present

## 2020-03-02 DIAGNOSIS — I129 Hypertensive chronic kidney disease with stage 1 through stage 4 chronic kidney disease, or unspecified chronic kidney disease: Secondary | ICD-10-CM | POA: Diagnosis not present

## 2020-03-02 DIAGNOSIS — I10 Essential (primary) hypertension: Secondary | ICD-10-CM | POA: Diagnosis not present

## 2020-03-02 DIAGNOSIS — E559 Vitamin D deficiency, unspecified: Secondary | ICD-10-CM | POA: Diagnosis not present

## 2020-03-02 LAB — CBC WITH DIFFERENTIAL/PLATELET
Abs Immature Granulocytes: 0.03 10*3/uL (ref 0.00–0.07)
Basophils Absolute: 0 10*3/uL (ref 0.0–0.1)
Basophils Relative: 1 %
Eosinophils Absolute: 0.1 10*3/uL (ref 0.0–0.5)
Eosinophils Relative: 2 %
HCT: 29.3 % — ABNORMAL LOW (ref 36.0–46.0)
Hemoglobin: 9.5 g/dL — ABNORMAL LOW (ref 12.0–15.0)
Immature Granulocytes: 1 %
Lymphocytes Relative: 25 %
Lymphs Abs: 1.6 10*3/uL (ref 0.7–4.0)
MCH: 28.4 pg (ref 26.0–34.0)
MCHC: 32.4 g/dL (ref 30.0–36.0)
MCV: 87.7 fL (ref 80.0–100.0)
Monocytes Absolute: 0.5 10*3/uL (ref 0.1–1.0)
Monocytes Relative: 7 %
Neutro Abs: 4.2 10*3/uL (ref 1.7–7.7)
Neutrophils Relative %: 64 %
Platelets: 213 10*3/uL (ref 150–400)
RBC: 3.34 MIL/uL — ABNORMAL LOW (ref 3.87–5.11)
RDW: 13.2 % (ref 11.5–15.5)
WBC: 6.5 10*3/uL (ref 4.0–10.5)
nRBC: 0 % (ref 0.0–0.2)

## 2020-03-02 LAB — COMPREHENSIVE METABOLIC PANEL
ALT: 12 U/L (ref 0–44)
AST: 20 U/L (ref 15–41)
Albumin: 3.7 g/dL (ref 3.5–5.0)
Alkaline Phosphatase: 67 U/L (ref 38–126)
Anion gap: 10 (ref 5–15)
BUN: 39 mg/dL — ABNORMAL HIGH (ref 8–23)
CO2: 24 mmol/L (ref 22–32)
Calcium: 9.3 mg/dL (ref 8.9–10.3)
Chloride: 108 mmol/L (ref 98–111)
Creatinine, Ser: 1.7 mg/dL — ABNORMAL HIGH (ref 0.44–1.00)
GFR, Estimated: 31 mL/min — ABNORMAL LOW (ref >60)
Glucose, Bld: 151 mg/dL — ABNORMAL HIGH (ref 70–99)
Potassium: 3.8 mmol/L (ref 3.5–5.1)
Sodium: 142 mmol/L (ref 135–145)
Total Bilirubin: 0.6 mg/dL (ref 0.3–1.2)
Total Protein: 7.3 g/dL (ref 6.5–8.1)

## 2020-03-02 MED ORDER — EPOETIN ALFA-EPBX 10000 UNIT/ML IJ SOLN
10000.0000 [IU] | Freq: Once | INTRAMUSCULAR | Status: AC
  Administered 2020-03-02: 10000 [IU] via SUBCUTANEOUS

## 2020-03-02 MED ORDER — EPOETIN ALFA-EPBX 10000 UNIT/ML IJ SOLN
INTRAMUSCULAR | Status: AC
  Filled 2020-03-02: qty 1

## 2020-03-04 ENCOUNTER — Encounter: Payer: Self-pay | Admitting: Cardiovascular Disease

## 2020-03-04 ENCOUNTER — Other Ambulatory Visit: Payer: Self-pay

## 2020-03-04 ENCOUNTER — Ambulatory Visit (INDEPENDENT_AMBULATORY_CARE_PROVIDER_SITE_OTHER): Payer: Medicare Other | Admitting: Cardiovascular Disease

## 2020-03-04 VITALS — BP 132/64 | HR 58 | Ht 64.0 in | Wt 144.4 lb

## 2020-03-04 DIAGNOSIS — Z01812 Encounter for preprocedural laboratory examination: Secondary | ICD-10-CM | POA: Diagnosis not present

## 2020-03-04 DIAGNOSIS — I739 Peripheral vascular disease, unspecified: Secondary | ICD-10-CM | POA: Diagnosis not present

## 2020-03-04 NOTE — Progress Notes (Signed)
03/04/2020 [name]   [birth year]  [id number]  Primary Physician [name], MD Primary Cardiologist: Lorretta Harp MD [name], [place]  HPI:  [name] is a 74 y.o. mildly overweight married African-American female mother of 104 living daughter (son committed suicide), grandmother to 6 grandchildren referred by Dr. Tamala Julian, her cardiologist, for peripheral vascular evaluation.  In her younger years she worked for the [employer] and then for [employer].  Her risk factors include treated hypertension, diabetes and hyperlipidemia.  She is never had a heart attack or stroke.  She has had a diagnostic cath performed 04/17/2018 that showed some mild CAD.  She had COVID-19 back in January of this year recovered.  She does complain of lifestyle limiting claudication with Dopplers that were performed 01/29/2020 revealing a right ABI of 1.09 and a left of 0.89 with high-frequency signals in both SFAs.   Current Meds  Medication Sig  . amLODipine (NORVASC) 5 MG tablet Take 1 tablet (5 mg total) by mouth daily.  Marland Kitchen aspirin 81 MG chewable tablet Chew 81 mg by mouth daily.    . Blood Glucose Monitoring Suppl (ONETOUCH VERIO) w/Device KIT 1 each by Does not apply route daily.  . clopidogrel (PLAVIX) 75 MG tablet Take 1 tablet (75 mg total) by mouth daily.  . famotidine (PEPCID) 20 MG tablet Take 1 tablet (20 mg total) by mouth 2 (two) times daily.  Marland Kitchen glucose blood (ONETOUCH VERIO) test strip Use as instructed  . Insulin Glargine (LANTUS SOLOSTAR) 100 UNIT/ML Solostar Pen Inject 15 Units into the skin daily at 10 pm.  . Insulin Pen Needle 31G X 5 MM MISC Check blood sugar once daily  . isosorbide mononitrate (IMDUR) 60 MG 24 hr tablet TAKE 1 TABLET(60 MG) BY MOUTH DAILY  . Lancet Devices (ONE TOUCH DELICA LANCING DEV) MISC Check blood sugar once daily  . nitroGLYCERIN (NITROSTAT) 0.4 MG SL tablet PLACE 1 TABLET UNDER THE TONGUE EVERY 5 MINUTES  AS NEEDED. IF CHEST PAIN NOT RELIEVED BY THIRD PILL CALL 911  . quinapril-hydrochlorothiazide (ACCURETIC) 20-12.5 MG tablet Take 1 tablet by mouth daily.  . rosuvastatin (CRESTOR) 20 MG tablet Take 1 tablet (20 mg total) by mouth daily.     Allergies  Allergen Reactions  . Metformin And Related Diarrhea    Social History   Socioeconomic History  . Marital status: Legally Separated    Spouse name: Not on file  . Number of children: 2  . Years of education: Not on file  . Highest education level: Not on file  Occupational History  . Not on file  Tobacco Use  . Smoking status: Never Smoker  . Smokeless tobacco: Never Used  Vaping Use  . Vaping Use: Never used  Substance and Sexual Activity  . Alcohol use: No  . Drug use: No  . Sexual activity: Not on file  Other Topics Concern  . Not on file  Social History Narrative  . Not on file   Social Determinants of Health   Financial Resource Strain:   . Difficulty of Paying Living Expenses: Not on file  Food Insecurity:   . Worried About Charity fundraiser in the Last Year: Not on file  . Ran Out of Food in the Last Year: Not on file  Transportation Needs:   . Lack of Transportation (Medical): Not on file  . Lack of Transportation (Non-Medical): Not on file  Physical Activity:   . Days  of Exercise per Week: Not on file  . Minutes of Exercise per Session: Not on file  Stress:   . Feeling of Stress : Not on file  Social Connections:   . Frequency of Communication with Friends and Family: Not on file  . Frequency of Social Gatherings with Friends and Family: Not on file  . Attends Religious Services: Not on file  . Active Member of Clubs or Organizations: Not on file  . Attends Archivist Meetings: Not on file  . Marital Status: Not on file  Intimate Partner Violence:   . Fear of Current or Ex-Partner: Not on file  . Emotionally Abused: Not on file  . Physically Abused: Not on file  . Sexually Abused: Not on  file     Review of Systems: General: negative for chills, fever, night sweats or weight changes.  Cardiovascular: negative for chest pain, dyspnea on exertion, edema, orthopnea, palpitations, paroxysmal nocturnal dyspnea or shortness of breath Dermatological: negative for rash Respiratory: negative for cough or wheezing Urologic: negative for hematuria Abdominal: negative for nausea, vomiting, diarrhea, bright red blood per rectum, melena, or hematemesis Neurologic: negative for visual changes, syncope, or dizziness All other systems reviewed and are otherwise negative except as noted above.    Blood pressure 132/64, pulse (!) 58, height _0  (1.626 m), weight 144 lb 6.4 oz (65.5 kg).  General appearance: alert and no distress Neck: no adenopathy, no carotid bruit, no JVD, supple, symmetrical, trachea midline and thyroid not enlarged, symmetric, no tenderness/mass/nodules Lungs: clear to auscultation bilaterally Heart: regular rate and rhythm, S1, S2 normal, no murmur, click, rub or gallop Extremities: extremities normal, atraumatic, no cyanosis or edema Pulses: 2+ and symmetric Skin: Skin color, texture, turgor normal. No rashes or lesions Neurologic: Alert and oriented X 3, normal strength and tone. Normal symmetric reflexes. Normal coordination and gait  EKG not performed today  ASSESSMENT AND PLAN:   Peripheral arterial disease ([place]) Ms. Ebers was referred to me by Dr. Tamala Julian for symptomatic PAD.  She has multiple risk factors for vascular disease including treated diabetes, hypertension and hyperlipidemia.  She has known CAD by cath a year ago January.  She does complain of lifestyle limiting claudication with Doppler studies performed 01/29/2020 revealing a right ABI of 1.09 and a left of 0.89.  She has high-frequency signals in both SFAs and wishes to proceed with outpatient diagnostic peripheral angiography and endovascular therapy.  Her serum creatinine is in the 1.7 range  and she is on Accuretic which will be held 2 days prior to the procedure.  She will be admitted for prehydration.      Lorretta Harp MD FACP,FACC,FAHA, [facility] 03/04/2020 10:27 AM

## 2020-03-04 NOTE — H&P (View-Only) (Signed)
03/04/2020 [name]   1946/03/19  [id number]  Primary Physician Anne Haymaker, MD Primary Cardiologist: Anne Harp MD [name], [place]  HPI:  [name] is a 74 y.o. mildly overweight married African-American female mother of 34 living daughter (son committed suicide), grandmother to 6 grandchildren referred by Dr. Tamala [name], her cardiologist, for peripheral vascular evaluation.  In her younger years she worked for the [employer] and then for [employer].  Her risk factors include treated hypertension, diabetes and hyperlipidemia.  She is never had a heart attack or stroke.  She has had a diagnostic cath performed 04/17/2018 that showed some mild CAD.  She had COVID-19 back in January of this year recovered.  She does complain of lifestyle limiting claudication with Dopplers that were performed 01/29/2020 revealing a right ABI of 1.09 and a left of 0.89 with high-frequency signals in both SFAs.   Current Meds  Medication Sig  . amLODipine (NORVASC) 5 MG tablet Take 1 tablet (5 mg total) by mouth daily.  Marland Kitchen aspirin 81 MG chewable tablet Chew 81 mg by mouth daily.    . Blood Glucose Monitoring Suppl (ONETOUCH VERIO) w/Device KIT 1 each by Does not apply route daily.  . clopidogrel (PLAVIX) 75 MG tablet Take 1 tablet (75 mg total) by mouth daily.  . famotidine (PEPCID) 20 MG tablet Take 1 tablet (20 mg total) by mouth 2 (two) times daily.  Marland Kitchen glucose blood (ONETOUCH VERIO) test strip Use as instructed  . Insulin Glargine (LANTUS SOLOSTAR) 100 UNIT/ML Solostar Pen Inject 15 Units into the skin daily at 10 pm.  . Insulin Pen Needle 31G X 5 MM MISC Check blood sugar once daily  . isosorbide mononitrate (IMDUR) 60 MG 24 hr tablet TAKE 1 TABLET(60 MG) BY MOUTH DAILY  . Lancet Devices (ONE TOUCH DELICA LANCING DEV) MISC Check blood sugar once daily  . nitroGLYCERIN (NITROSTAT) 0.4 MG SL tablet PLACE 1 TABLET UNDER THE TONGUE EVERY 5 MINUTES  AS NEEDED. IF CHEST PAIN NOT RELIEVED BY THIRD PILL CALL 911  . quinapril-hydrochlorothiazide (ACCURETIC) 20-12.5 MG tablet Take 1 tablet by mouth daily.  . rosuvastatin (CRESTOR) 20 MG tablet Take 1 tablet (20 mg total) by mouth daily.     Allergies  Allergen Reactions  . Metformin And Related Diarrhea    Social History   Socioeconomic History  . Marital status: Legally Separated    Spouse name: Not on file  . Number of children: 2  . Years of education: Not on file  . Highest education level: Not on file  Occupational History  . Not on file  Tobacco Use  . Smoking status: Never Smoker  . Smokeless tobacco: Never Used  Vaping Use  . Vaping Use: Never used  Substance and Sexual Activity  . Alcohol use: No  . Drug use: No  . Sexual activity: Not on file  Other Topics Concern  . Not on file  Social History Narrative  . Not on file   Social Determinants of Health   Financial Resource Strain:   . Difficulty of Paying Living Expenses: Not on file  Food Insecurity:   . Worried About Charity fundraiser in the Last Year: Not on file  . Ran Out of Food in the Last Year: Not on file  Transportation Needs:   . Lack of Transportation (Medical): Not on file  . Lack of Transportation (Non-Medical): Not on file  Physical Activity:   . Days  of Exercise per Week: Not on file  . Minutes of Exercise per Session: Not on file  Stress:   . Feeling of Stress : Not on file  Social Connections:   . Frequency of Communication with Friends and Family: Not on file  . Frequency of Social Gatherings with Friends and Family: Not on file  . Attends Religious Services: Not on file  . Active Member of Clubs or Organizations: Not on file  . Attends Archivist Meetings: Not on file  . Marital Status: Not on file  Intimate Partner Violence:   . Fear of Current or Ex-Partner: Not on file  . Emotionally Abused: Not on file  . Physically Abused: Not on file  . Sexually Abused: Not on  file     Review of Systems: General: negative for chills, fever, night sweats or weight changes.  Cardiovascular: negative for chest pain, dyspnea on exertion, edema, orthopnea, palpitations, paroxysmal nocturnal dyspnea or shortness of breath Dermatological: negative for rash Respiratory: negative for cough or wheezing Urologic: negative for hematuria Abdominal: negative for nausea, vomiting, diarrhea, bright red blood per rectum, melena, or hematemesis Neurologic: negative for visual changes, syncope, or dizziness All other systems reviewed and are otherwise negative except as noted above.    Blood pressure 132/64, pulse (!) 58, height _0  (1.626 m), weight 144 lb 6.4 oz (65.5 kg).  General appearance: alert and no distress Neck: no adenopathy, no carotid bruit, no JVD, supple, symmetrical, trachea midline and thyroid not enlarged, symmetric, no tenderness/mass/nodules Lungs: clear to auscultation bilaterally Heart: regular rate and rhythm, S1, S2 normal, no murmur, click, rub or gallop Extremities: extremities normal, atraumatic, no cyanosis or edema Pulses: 2+ and symmetric Skin: Skin color, texture, turgor normal. No rashes or lesions Neurologic: Alert and oriented X 3, normal strength and tone. Normal symmetric reflexes. Normal coordination and gait  EKG not performed today  ASSESSMENT AND PLAN:   Peripheral arterial disease ([place]) [name] was referred to me by Dr. Tamala [name] for symptomatic PAD.  She has multiple risk factors for vascular disease including treated diabetes, hypertension and hyperlipidemia.  She has known CAD by cath a year ago January.  She does complain of lifestyle limiting claudication with Doppler studies performed 01/29/2020 revealing a right ABI of 1.09 and a left of 0.89.  She has high-frequency signals in both SFAs and wishes to proceed with outpatient diagnostic peripheral angiography and endovascular therapy.  Her serum creatinine is in the 1.7 range  and she is on Accuretic which will be held 2 days prior to the procedure.  She will be admitted for prehydration.      Anne Harp MD FACP,FACC,FAHA, [facility] 03/04/2020 10:27 AM

## 2020-03-04 NOTE — Patient Instructions (Signed)
Medication Instructions:  Your physician recommends that you continue on your current medications as directed. Please refer to the Current Medication list given to you today.  *If you need a refill on your cardiac medications before your next appointment, please call your pharmacy*   Lab Work: [facility] - pre-procedure lab work   You will need to have the coronavirus test completed prior to your procedure. An appointment has been made at 11:30am on  03/21/2020. This is a Drive Up Visit at the [address] . Please tell them that you are there for pre-procedure testing. Someone will direct you to the appropriate testing line. Stay in your car and someone will be with you shortly. Please make sure to have all other labs completed before this test because you will need to stay quarantined until your procedure. Please take your insurance card to this test.    If you have labs (blood work) drawn today and your tests are completely normal, you will receive your results only by: Marland Kitchen MyChart Message (if you have MyChart) OR . A paper copy in the mail If you have any lab test that is abnormal or we need to change your treatment, we will call you to review the results.   Testing/Procedures: Peripheral Vascular Angiogram @ [facility]  Leg Doppler study @ Dr. Kennon Holter office 1 week after procedure   Follow-Up: At [facility], you and your health needs are our priority.  As part of our continuing mission to provide you with exceptional heart care, we have created designated Provider Care Teams.  These Care Teams include your primary Cardiologist (physician) and Advanced Practice Providers (APPs -  Physician Assistants and Nurse Practitioners) who all work together to provide you with the care you need, when you need it.  We recommend signing up for the patient portal called "MyChart".  Sign up information is provided on this After Visit Summary.  MyChart is used to connect with  patients for Virtual Visits (Telemedicine).  Patients are able to view lab/test results, encounter notes, upcoming appointments, etc.  Non-urgent messages can be sent to your provider as well.   To learn more about what you can do with MyChart, go to [email or web address].    Your next appointment:   2 week(s) after procedure  The format for your next appointment:   In Person  Provider:   Quay Burow, MD   Other Instructions     [place] [address] Dept: [phone] Loc: [place]  03/04/2020  You are scheduled for a Peripheral Angiogram on Monday, December 13 with Dr. Quay Burow.  1. Please arrive at the [facility] (Main Entrance A) at [facility]: [address] [place],  [address] at 7:30am. Free valet parking service is available.   Special note: Every effort is made to have your procedure done on time. Please understand that emergencies sometimes delay scheduled procedures.  2. Diet: Do not eat solid foods after midnight.  The patient may have clear liquids until 5am upon the day of the procedure.  3. Labs: You will need to have blood drawn on Dec 6 - 10. You do not need to be fasting.  4. Medication instructions in preparation for your procedure:  HOLD quinapril-hydrochlorothiazide (ACCURETIC) 20-12.5 MG tablet TWO DAYS prior to procedure  TAKE half-dose of Lantus the evening prior to procedure  On the  morning of your procedure, take your Aspirin/Plavis and any morning medicines NOT listed above.  You may use sips of water.  5. Plan for one night stay--bring personal belongings. 6. Bring a current list of your medications and current insurance cards. 7. You MUST have a responsible person to drive you home. 8. Someone MUST be with you the first 24 hours after you arrive home or your discharge will be  delayed. 9. Please wear clothes that are easy to get on and off and wear slip-on shoes.  Thank you for allowing [place] to care for you!   -- [facility]

## 2020-03-04 NOTE — Assessment & Plan Note (Signed)
[name] was referred to me by Dr. Tamala Julian for symptomatic PAD.  She has multiple risk factors for vascular disease including treated diabetes, hypertension and hyperlipidemia.  She has known CAD by cath a year ago January.  She does complain of lifestyle limiting claudication with Doppler studies performed 01/29/2020 revealing a right ABI of 1.09 and a left of 0.89.  She has high-frequency signals in both SFAs and wishes to proceed with outpatient diagnostic peripheral angiography and endovascular therapy.  Her serum creatinine is in the 1.7 range and she is on Accuretic which will be held 2 days prior to the procedure.  She will be admitted for prehydration.

## 2020-03-09 MED ORDER — LANTUS SOLOSTAR 100 UNIT/ML ~~LOC~~ SOPN: 15.0000 [IU] | PEN_INJECTOR | Freq: Every day | SUBCUTANEOUS | 1 refills | Status: DC

## 2020-03-12 ENCOUNTER — Telehealth: Payer: Self-pay | Admitting: Hematology and Oncology

## 2020-03-12 NOTE — Telephone Encounter (Signed)
Called pt per 12/2 sch msg - no answer goes right to busy tone . Called 2x - unable to reach pt  To reschedule per message request.

## 2020-03-19 ENCOUNTER — Telehealth: Payer: Self-pay | Admitting: *Deleted

## 2020-03-19 ENCOUNTER — Telehealth: Payer: Self-pay | Admitting: Hematology and Oncology

## 2020-03-19 DIAGNOSIS — I739 Peripheral vascular disease, unspecified: Secondary | ICD-10-CM | POA: Diagnosis not present

## 2020-03-19 DIAGNOSIS — Z01812 Encounter for preprocedural laboratory examination: Secondary | ICD-10-CM | POA: Diagnosis not present

## 2020-03-19 NOTE — Telephone Encounter (Addendum)
Pt contacted pre-abdominal aortogram scheduled at [facility] for: Monday March 23, 2020 11:30 AM Verified arrival time and place: [facility]) at:7 AM-pre-procedure hydration   No solid food after midnight prior to cath, clear liquids until 5 AM day of procedure.  Hold: Insulin-AM of procedure Quinipril-HCT-day before and day of procedure-GFR 31   Except hold medications AM meds can be  taken pre-cath with sips of water including: ASA 81 mg Plavix 75 mg  Confirmed patient has responsible adult to drive home post procedure and be with patient first 24 hours after arriving home: yes  You are allowed ONE visitor in the waiting room during the time you are at the hospital for your procedure. Both you and your visitor must wear a mask once you enter the hospital.       COVID-19 Pre-Screening Questions:  . In the past 14 days have you had any symptoms concerning for COVID-19 infection (fever, chills, cough, or new shortness of breath)? no . In the past 14 days have you been around anyone with known Covid 19? No   Reviewed procedure/mask/visitor instructions, COVID-19 questions with patient.

## 2020-03-19 NOTE — Telephone Encounter (Signed)
Rescheduled appointment per patient request. Spoke to patient who is aware of updated appointment date and time.  

## 2020-03-20 LAB — BASIC METABOLIC PANEL
BUN/Creatinine Ratio: 26 (ref 12–28)
BUN: 38 mg/dL — ABNORMAL HIGH (ref 8–27)
CO2: 24 mmol/L (ref 20–29)
Calcium: 9.8 mg/dL (ref 8.7–10.3)
Chloride: 102 mmol/L (ref 96–106)
Creatinine, Ser: 1.46 mg/dL — ABNORMAL HIGH (ref 0.57–1.00)
GFR calc Af Amer: 41 mL/min/{1.73_m2} — ABNORMAL LOW (ref >59)
GFR calc non Af Amer: 35 mL/min/{1.73_m2} — ABNORMAL LOW (ref >59)
Glucose: 207 mg/dL — ABNORMAL HIGH (ref 65–99)
Potassium: 3.7 mmol/L (ref 3.5–5.2)
Sodium: 142 mmol/L (ref 134–144)

## 2020-03-20 LAB — CBC
Hematocrit: 31.1 % — ABNORMAL LOW (ref 34.0–46.6)
Hemoglobin: 10 g/dL — ABNORMAL LOW (ref 11.1–15.9)
MCH: 27.7 pg (ref 26.6–33.0)
MCHC: 32.2 g/dL (ref 31.5–35.7)
MCV: 86 fL (ref 79–97)
Platelets: 218 10*3/uL (ref 150–450)
RBC: 3.61 x10E6/uL — ABNORMAL LOW (ref 3.77–5.28)
RDW: 13.1 % (ref 11.7–15.4)
WBC: 8 10*3/uL (ref 3.4–10.8)

## 2020-03-21 ENCOUNTER — Other Ambulatory Visit (HOSPITAL_COMMUNITY)
Admission: RE | Admit: 2020-03-21 | Discharge: 2020-03-21 | Disposition: A | Discharge status: Home / Self Care | Payer: Medicare Other | Source: Ambulatory Visit | Attending: Cardiovascular Disease | Admitting: Cardiovascular Disease

## 2020-03-21 DIAGNOSIS — Z01812 Encounter for preprocedural laboratory examination: Secondary | ICD-10-CM | POA: Diagnosis not present

## 2020-03-21 DIAGNOSIS — Z20822 Contact with and (suspected) exposure to covid-19: Secondary | ICD-10-CM | POA: Insufficient documentation

## 2020-03-22 LAB — SARS CORONAVIRUS 2 (TAT 6-24 HRS): SARS Coronavirus 2: NEGATIVE

## 2020-03-23 ENCOUNTER — Encounter (HOSPITAL_COMMUNITY): Payer: Self-pay | Admitting: Cardiovascular Disease

## 2020-03-23 ENCOUNTER — Ambulatory Visit: Payer: Medicare Other

## 2020-03-23 ENCOUNTER — Other Ambulatory Visit: Payer: Self-pay

## 2020-03-23 ENCOUNTER — Encounter (HOSPITAL_COMMUNITY)
Admission: RE | Disposition: A | Discharge status: Home / Self Care | Payer: Self-pay | Source: Home / Self Care | Attending: Cardiovascular Disease

## 2020-03-23 ENCOUNTER — Ambulatory Visit (HOSPITAL_COMMUNITY)
Admission: RE | Admit: 2020-03-23 | Discharge: 2020-03-24 | Disposition: A | Discharge status: Home / Self Care | Payer: Medicare Other | Attending: Cardiovascular Disease | Admitting: Cardiovascular Disease

## 2020-03-23 ENCOUNTER — Other Ambulatory Visit: Payer: Medicare Other

## 2020-03-23 DIAGNOSIS — Z79899 Other long term (current) drug therapy: Secondary | ICD-10-CM | POA: Diagnosis not present

## 2020-03-23 DIAGNOSIS — E1151 Type 2 diabetes mellitus with diabetic peripheral angiopathy without gangrene: Secondary | ICD-10-CM | POA: Insufficient documentation

## 2020-03-23 DIAGNOSIS — I2510 Atherosclerotic heart disease of native coronary artery without angina pectoris: Secondary | ICD-10-CM | POA: Diagnosis not present

## 2020-03-23 DIAGNOSIS — E785 Hyperlipidemia, unspecified: Secondary | ICD-10-CM | POA: Diagnosis not present

## 2020-03-23 DIAGNOSIS — Z794 Long term (current) use of insulin: Secondary | ICD-10-CM | POA: Diagnosis not present

## 2020-03-23 DIAGNOSIS — Z7902 Long term (current) use of antithrombotics/antiplatelets: Secondary | ICD-10-CM | POA: Diagnosis not present

## 2020-03-23 DIAGNOSIS — I70212 Atherosclerosis of native arteries of extremities with intermittent claudication, left leg: Secondary | ICD-10-CM | POA: Insufficient documentation

## 2020-03-23 DIAGNOSIS — I10 Essential (primary) hypertension: Secondary | ICD-10-CM | POA: Diagnosis not present

## 2020-03-23 DIAGNOSIS — Z7982 Long term (current) use of aspirin: Secondary | ICD-10-CM | POA: Insufficient documentation

## 2020-03-23 DIAGNOSIS — I739 Peripheral vascular disease, unspecified: Secondary | ICD-10-CM | POA: Diagnosis present

## 2020-03-23 HISTORY — PX: ABDOMINAL AORTOGRAM W/LOWER EXTREMITY: CATH118223

## 2020-03-23 HISTORY — PX: EMBOLIZATION (CATH LAB): CATH118239

## 2020-03-23 HISTORY — PX: EMBOLIZATION: CATH118239

## 2020-03-23 LAB — POCT ACTIVATED CLOTTING TIME
Activated Clotting Time: 273 seconds
Activated Clotting Time: 297 seconds
Activated Clotting Time: 249 seconds
Activated Clotting Time: 208 seconds
Activated Clotting Time: 190 seconds
Activated Clotting Time: 166 seconds

## 2020-03-23 LAB — GLUCOSE, CAPILLARY
Glucose-Capillary: 100 mg/dL — ABNORMAL HIGH (ref 70–99)
Glucose-Capillary: 65 mg/dL — ABNORMAL LOW (ref 70–99)
Glucose-Capillary: 121 mg/dL — ABNORMAL HIGH (ref 70–99)
Glucose-Capillary: 66 mg/dL — ABNORMAL LOW (ref 70–99)
Glucose-Capillary: 81 mg/dL (ref 70–99)
Glucose-Capillary: 274 mg/dL — ABNORMAL HIGH (ref 70–99)

## 2020-03-23 SURGERY — ABDOMINAL AORTOGRAM W/LOWER EXTREMITY
Anesthesia: LOCAL | Laterality: Left

## 2020-03-23 MED ORDER — FAMOTIDINE 20 MG PO TABS: 20.0000 mg | ORAL_TABLET | Freq: Every day | ORAL | Status: DC | PRN

## 2020-03-23 MED ORDER — SODIUM CHLORIDE 0.9 % IV SOLN: 250.0000 mL | INTRAVENOUS | Status: DC | PRN

## 2020-03-23 MED ORDER — ASPIRIN EC 81 MG PO TBEC
81.0000 mg | DELAYED_RELEASE_TABLET | Freq: Every day | ORAL | Status: DC
  Filled 2020-03-23: qty 1

## 2020-03-23 MED ORDER — HYDROCHLOROTHIAZIDE 12.5 MG PO CAPS
12.5000 mg | ORAL_CAPSULE | Freq: Every day | ORAL | Status: DC
  Administered 2020-03-23: 12.5 mg via ORAL
  Filled 2020-03-23 (×2): qty 1

## 2020-03-23 MED ORDER — ROSUVASTATIN CALCIUM 20 MG PO TABS
20.0000 mg | ORAL_TABLET | Freq: Every day | ORAL | Status: DC
  Filled 2020-03-23: qty 1

## 2020-03-23 MED ORDER — ACETAMINOPHEN 325 MG PO TABS: 650.0000 mg | ORAL_TABLET | ORAL | Status: DC | PRN

## 2020-03-23 MED ORDER — FENTANYL CITRATE (PF) 100 MCG/2ML IJ SOLN
INTRAMUSCULAR | Status: AC
  Filled 2020-03-23: qty 2

## 2020-03-23 MED ORDER — SODIUM CHLORIDE 0.9 % WEIGHT BASED INFUSION
1.0000 mL/kg/h | INTRAVENOUS | Status: DC
  Administered 2020-03-23: 1 mL/kg/h via INTRAVENOUS

## 2020-03-23 MED ORDER — ASPIRIN 81 MG PO CHEW: 81.0000 mg | CHEWABLE_TABLET | Freq: Every day | ORAL | Status: DC

## 2020-03-23 MED ORDER — CLOPIDOGREL BISULFATE 75 MG PO TABS: 75.0000 mg | ORAL_TABLET | ORAL | Status: DC

## 2020-03-23 MED ORDER — SODIUM CHLORIDE 0.9% FLUSH: 3.0000 mL | Freq: Two times a day (BID) | INTRAVENOUS | Status: DC

## 2020-03-23 MED ORDER — SODIUM CHLORIDE 0.9% FLUSH: 3.0000 mL | INTRAVENOUS | Status: DC | PRN

## 2020-03-23 MED ORDER — SODIUM CHLORIDE 0.9 % IV SOLN: INTRAVENOUS | Status: AC

## 2020-03-23 MED ORDER — ONDANSETRON HCL 4 MG/2ML IJ SOLN: 4.0000 mg | Freq: Four times a day (QID) | INTRAMUSCULAR | Status: DC | PRN

## 2020-03-23 MED ORDER — HEPARIN (PORCINE) IN NACL 1000-0.9 UT/500ML-% IV SOLN
INTRAVENOUS | Status: AC
  Filled 2020-03-23: qty 1000

## 2020-03-23 MED ORDER — HEPARIN (PORCINE) IN NACL 1000-0.9 UT/500ML-% IV SOLN
INTRAVENOUS | Status: DC | PRN
  Administered 2020-03-23 (×2): 500 mL

## 2020-03-23 MED ORDER — LABETALOL HCL 5 MG/ML IV SOLN: 10.0000 mg | INTRAVENOUS | Status: DC | PRN

## 2020-03-23 MED ORDER — MORPHINE SULFATE (PF) 2 MG/ML IV SOLN: 2.0000 mg | INTRAVENOUS | Status: DC | PRN

## 2020-03-23 MED ORDER — ONETOUCH VERIO W/DEVICE KIT: 1.0000 | PACK | Freq: Every day | Status: DC

## 2020-03-23 MED ORDER — LISINOPRIL 10 MG PO TABS
20.0000 mg | ORAL_TABLET | Freq: Every day | ORAL | Status: DC
  Administered 2020-03-23: 20 mg via ORAL
  Filled 2020-03-23 (×2): qty 2

## 2020-03-23 MED ORDER — QUINAPRIL-HYDROCHLOROTHIAZIDE 20-12.5 MG PO TABS: 1.0000 | ORAL_TABLET | Freq: Every day | ORAL | Status: DC

## 2020-03-23 MED ORDER — HEPARIN SODIUM (PORCINE) 1000 UNIT/ML IJ SOLN
INTRAMUSCULAR | Status: AC
  Filled 2020-03-23: qty 1

## 2020-03-23 MED ORDER — HEPARIN SODIUM (PORCINE) 1000 UNIT/ML IJ SOLN
INTRAMUSCULAR | Status: DC | PRN
  Administered 2020-03-23: 7000 [IU] via INTRAVENOUS
  Administered 2020-03-23: 3000 [IU] via INTRAVENOUS

## 2020-03-23 MED ORDER — CLOPIDOGREL BISULFATE 75 MG PO TABS
75.0000 mg | ORAL_TABLET | Freq: Every day | ORAL | Status: DC
  Filled 2020-03-23: qty 1

## 2020-03-23 MED ORDER — ASPIRIN 81 MG PO CHEW: 81.0000 mg | CHEWABLE_TABLET | ORAL | Status: DC

## 2020-03-23 MED ORDER — FENTANYL CITRATE (PF) 100 MCG/2ML IJ SOLN
INTRAMUSCULAR | Status: DC | PRN
  Administered 2020-03-23 (×2): 25 ug via INTRAVENOUS

## 2020-03-23 MED ORDER — AMLODIPINE BESYLATE 5 MG PO TABS
5.0000 mg | ORAL_TABLET | Freq: Every day | ORAL | Status: DC
  Filled 2020-03-23: qty 1

## 2020-03-23 MED ORDER — SODIUM CHLORIDE 0.9 % WEIGHT BASED INFUSION
3.0000 mL/kg/h | INTRAVENOUS | Status: DC
  Administered 2020-03-23: 3 mL/kg/h via INTRAVENOUS

## 2020-03-23 MED ORDER — CLOPIDOGREL BISULFATE 75 MG PO TABS: 75.0000 mg | ORAL_TABLET | Freq: Every day | ORAL | Status: DC

## 2020-03-23 MED ORDER — ISOSORBIDE MONONITRATE ER 60 MG PO TB24
60.0000 mg | ORAL_TABLET | Freq: Every day | ORAL | Status: DC
  Filled 2020-03-23: qty 1

## 2020-03-23 MED ORDER — MIDAZOLAM HCL 2 MG/2ML IJ SOLN
INTRAMUSCULAR | Status: AC
  Filled 2020-03-23: qty 2

## 2020-03-23 MED ORDER — LIDOCAINE HCL (PF) 1 % IJ SOLN
INTRAMUSCULAR | Status: AC
  Filled 2020-03-23: qty 30

## 2020-03-23 MED ORDER — MIDAZOLAM HCL 2 MG/2ML IJ SOLN
INTRAMUSCULAR | Status: DC | PRN
  Administered 2020-03-23: 1 mg via INTRAVENOUS

## 2020-03-23 MED ORDER — INSULIN ASPART 100 UNIT/ML ~~LOC~~ SOLN: 0.0000 [IU] | Freq: Three times a day (TID) | SUBCUTANEOUS | Status: DC

## 2020-03-23 MED ORDER — IODIXANOL 320 MG/ML IV SOLN
INTRAVENOUS | Status: DC | PRN
  Administered 2020-03-23: 150 mL

## 2020-03-23 MED ORDER — LIDOCAINE HCL (PF) 1 % IJ SOLN
INTRAMUSCULAR | Status: DC | PRN
  Administered 2020-03-23: 28 mL

## 2020-03-23 MED ORDER — HYDRALAZINE HCL 20 MG/ML IJ SOLN: 5.0000 mg | INTRAMUSCULAR | Status: DC | PRN

## 2020-03-23 SURGICAL SUPPLY — 22 items
BALLN IN.PACT DCB 5X60 (BALLOONS) ×2
CATH ANGIO 5F PIGTAIL 65CM (CATHETERS) ×2 IMPLANT
CATH CROSS OVER TEMPO 5F (CATHETERS) ×2 IMPLANT
CATH HAWKONE LX EXTENDED TIP (CATHETERS) ×2 IMPLANT
CATH STRAIGHT 5FR 65CM (CATHETERS) ×2 IMPLANT
DCB IN.PACT 5X60 (BALLOONS) ×1 IMPLANT
DEVICE CONTINUOUS FLUSH (MISCELLANEOUS) ×2 IMPLANT
DEVICE SPIDERFX EMB PROT 6MM (WIRE) ×2 IMPLANT
GUIDEWIRE ANGLED .035X150CM (WIRE) ×2 IMPLANT
GUIDEWIRE ZILIENT 6G 014 (WIRE) ×2 IMPLANT
KIT ENCORE 26 ADVANTAGE (KITS) ×2 IMPLANT
KIT PV (KITS) ×2 IMPLANT
SHEATH HIGHFLEX ANSEL 7FR 55CM (SHEATH) ×2 IMPLANT
SHEATH PINNACLE 5F 10CM (SHEATH) ×2 IMPLANT
SHEATH PINNACLE 7F 10CM (SHEATH) ×2 IMPLANT
SHEATH PROBE COVER 6X72 (BAG) ×2 IMPLANT
SYR MEDRAD MARK 7 150ML (SYRINGE) ×2 IMPLANT
TAPE VIPERTRACK RADIOPAQ (MISCELLANEOUS) ×1 IMPLANT
TAPE VIPERTRACK RADIOPAQUE (MISCELLANEOUS) ×2
TRANSDUCER W/STOPCOCK (MISCELLANEOUS) ×2 IMPLANT
TRAY PV CATH (CUSTOM PROCEDURE TRAY) ×2 IMPLANT
WIRE HITORQ VERSACORE ST 145CM (WIRE) ×4 IMPLANT

## 2020-03-23 NOTE — Progress Notes (Signed)
Pt CBG rechecked at 66. Pt given apple sauce and Ginger ale. Will recheck and continue to monitor.  Jerald Kief, RN

## 2020-03-23 NOTE — Progress Notes (Signed)
Pt sheath pulled at 17:05. Manual pressure held 20 mins. Groin level 0 with clean, dry, intact dressing. Pt tolerated well. All vitals stable. Jerald Kief, RN

## 2020-03-23 NOTE — Progress Notes (Signed)
CBG recheck 121.  Will continue to monitor pt.

## 2020-03-23 NOTE — Interval H&P Note (Signed)
History and Physical Interval Note:  03/23/2020 11:18 AM  [name]  has presented today for surgery, with the diagnosis of PAD.  The various methods of treatment have been discussed with the patient and family. After consideration of risks, benefits and other options for treatment, the patient has consented to  Procedure(s): ABDOMINAL AORTOGRAM W/LOWER EXTREMITY (Bilateral) as a surgical intervention.  The patient's history has been reviewed, patient examined, no change in status, stable for surgery.  I have reviewed the patient's chart and labs.  Questions were answered to the patient's satisfaction.     Quay Burow

## 2020-03-23 NOTE — Progress Notes (Signed)
Pt CBG check and resulted as 65. Pt states she is hungry. Asymptomatic for hypoglycemia. Pt given Ginger Ale. CBG will be rechecked. Will continue to monitor.  Jerald Kief, RN

## 2020-03-23 NOTE — Progress Notes (Signed)
Patient received into 4E18. Vital signs completed and charted. CHG and skin assessment completed. Groin puncture site level zero. Patient started on bedrest and instructed to stay flat in bed for 4 hours. Cardiac monitor applied and central telemetry notified. No patient complaints at this time. Will continue to monitor.   -Donnelly Angelica, RN

## 2020-03-24 ENCOUNTER — Encounter (HOSPITAL_COMMUNITY): Payer: Self-pay | Admitting: Cardiovascular Disease

## 2020-03-24 DIAGNOSIS — I10 Essential (primary) hypertension: Secondary | ICD-10-CM | POA: Diagnosis not present

## 2020-03-24 DIAGNOSIS — I2510 Atherosclerotic heart disease of native coronary artery without angina pectoris: Secondary | ICD-10-CM | POA: Diagnosis not present

## 2020-03-24 DIAGNOSIS — Z7902 Long term (current) use of antithrombotics/antiplatelets: Secondary | ICD-10-CM | POA: Diagnosis not present

## 2020-03-24 DIAGNOSIS — E1151 Type 2 diabetes mellitus with diabetic peripheral angiopathy without gangrene: Secondary | ICD-10-CM | POA: Diagnosis not present

## 2020-03-24 DIAGNOSIS — Z7982 Long term (current) use of aspirin: Secondary | ICD-10-CM | POA: Diagnosis not present

## 2020-03-24 DIAGNOSIS — Z794 Long term (current) use of insulin: Secondary | ICD-10-CM | POA: Diagnosis not present

## 2020-03-24 DIAGNOSIS — I739 Peripheral vascular disease, unspecified: Secondary | ICD-10-CM

## 2020-03-24 DIAGNOSIS — Z79899 Other long term (current) drug therapy: Secondary | ICD-10-CM | POA: Diagnosis not present

## 2020-03-24 DIAGNOSIS — I70212 Atherosclerosis of native arteries of extremities with intermittent claudication, left leg: Secondary | ICD-10-CM | POA: Diagnosis not present

## 2020-03-24 DIAGNOSIS — E785 Hyperlipidemia, unspecified: Secondary | ICD-10-CM | POA: Diagnosis not present

## 2020-03-24 LAB — CBC
HCT: 27.8 % — ABNORMAL LOW (ref 36.0–46.0)
Hemoglobin: 8.8 g/dL — ABNORMAL LOW (ref 12.0–15.0)
MCH: 27.8 pg (ref 26.0–34.0)
MCHC: 31.7 g/dL (ref 30.0–36.0)
MCV: 87.7 fL (ref 80.0–100.0)
Platelets: 168 10*3/uL (ref 150–400)
RBC: 3.17 MIL/uL — ABNORMAL LOW (ref 3.87–5.11)
RDW: 13.2 % (ref 11.5–15.5)
WBC: 7 10*3/uL (ref 4.0–10.5)
nRBC: 0 % (ref 0.0–0.2)

## 2020-03-24 LAB — BASIC METABOLIC PANEL
Anion gap: 9 (ref 5–15)
BUN: 24 mg/dL — ABNORMAL HIGH (ref 8–23)
CO2: 21 mmol/L — ABNORMAL LOW (ref 22–32)
Calcium: 8.7 mg/dL — ABNORMAL LOW (ref 8.9–10.3)
Chloride: 108 mmol/L (ref 98–111)
Creatinine, Ser: 1.23 mg/dL — ABNORMAL HIGH (ref 0.44–1.00)
GFR, Estimated: 46 mL/min — ABNORMAL LOW (ref >60)
Glucose, Bld: 187 mg/dL — ABNORMAL HIGH (ref 70–99)
Potassium: 3.6 mmol/L (ref 3.5–5.1)
Sodium: 138 mmol/L (ref 135–145)

## 2020-03-24 LAB — HEMOGLOBIN A1C
Hgb A1c MFr Bld: 6.9 % — ABNORMAL HIGH (ref 4.8–5.6)
Mean Plasma Glucose: 151.33 mg/dL

## 2020-03-24 LAB — GLUCOSE, CAPILLARY: Glucose-Capillary: 119 mg/dL — ABNORMAL HIGH (ref 70–99)

## 2020-03-24 NOTE — Progress Notes (Addendum)
Pt's bed alarm was on throughout the shift. Pt was very upset this morning when the bed alarm went off. She has stated that she had called out several times to use the restroom but no one attended her calls. This RN educated her about the high fall risk and ensured that it was for her safety. Pt refused vital signs this morning and does not want the bed alarm turned back on. Pt sitting comfortably in chair. Will continue to monitor.   Fransico Michael, RN

## 2020-03-24 NOTE — Discharge Summary (Signed)
Discharge Summary    Patient ID: [name] MRN: [id number]; DOB: 07-04-1945  Admit date: 03/23/2020 Discharge date: 03/24/2020  Primary Care Provider: Mirian Mo, MD  Primary Cardiologist: Lesleigh Noe, MD  Primary Electrophysiologist:  None   Discharge Diagnoses    Active Problems:   Peripheral arterial disease [facility])   Claudication in peripheral vascular disease [facility])    Diagnostic Studies/Procedures    PV angiogram: 03/23/20  Final Impression: Successful Hawk 1 directional atherectomy followed by drug-coated balloon angioplasty using spider distal protection in the setting of lifestyle limiting claudication.  The patient will need need to be hydrated overnight.  The sheath will be removed once ACT falls below 170 pressure held.  She was already on dual antiplatelet therapy which will be continued.  She will be discharged home in the morning.  We will obtain lower extremity arterial Doppler studies in our [facility] line office next week and I will see her back 1 to 2 weeks thereafter.  At that point we will discuss staged right SFA intervention.    [name], [name] 03/23/2020  _____________   History of Present Illness     [name] is a 74 y.o. female with who was referred to Dr. Allyson Sabal by Dr. Katrinka Blazing, her cardiologist, for peripheral vascular evaluation.  In her younger years she worked for the [employer] and then for [employer].  Her risk factors include treated hypertension, diabetes and hyperlipidemia.  She has never had a heart attack or stroke.  She has had a diagnostic cath performed 04/17/2018 that showed some mild CAD.  She had COVID-19 back in January of this year recovered.  She did complain of lifestyle limiting claudication with Dopplers that were performed 01/29/2020 revealing a right ABI of 1.09 and a left of 0.89 with high-frequency signals in both SFAs. Given this she was set up for outpatient PV  angiogram.    Hospital Course     Underwent successful [facility] 1 directional atherectomy followed by drug-coated balloon angioplasty using spider distal protection in the setting of lifestyle limiting claudication. She will be continued on DAPT with ASA/Plavix for at least one year.No complications noted overnight. Ambulated without difficulty. No significant changes made to her home medications.   General: Well developed, well nourished, female appearing in no acute distress. Head: Normocephalic, atraumatic.  Neck: Supple without bruits, JVD. Lungs:  Resp regular and unlabored, CTA. Heart: RRR, S1, S2, no S3, S4, or murmur; no rub. Abdomen: Soft, non-tender, non-distended with normoactive bowel sounds. No hepatomegaly. No rebound/guarding. No obvious abdominal masses. Extremities: No clubbing, cyanosis, edema. Distal pedal pulses are 2+ bilaterally. Right cath site stable without bruising or hematoma Neuro: Alert and oriented X 3. Moves all extremities spontaneously. Psych: Normal affect.  Did the patient have an acute coronary syndrome (MI, NSTEMI, STEMI, etc) this admission?:  No                               Did the patient have a percutaneous coronary intervention (stent / angioplasty)?:  No.       _____________  Discharge Vitals Blood pressure (!) 160/64, pulse 66, temperature 98.1 F (36.7 C), temperature source Oral, resp. rate 16, height 5\' 4"  (1.626 m), weight 71.5 kg, SpO2 100 %.  Filed Weights   03/23/20 0727 03/23/20 1759  Weight: 65.3 kg 71.5 kg    Labs & Radiologic Studies    CBC Recent  Labs    03/24/20 0132  WBC 7.0  HGB 8.8*  HCT 27.8*  MCV 87.7  PLT 168   Basic Metabolic Panel Recent Labs    69/50/72 0132  NA 138  K 3.6  CL 108  CO2 21*  GLUCOSE 187*  BUN 24*  CREATININE 1.23*  CALCIUM 8.7*   Liver Function Tests No results for input(s): AST, ALT, ALKPHOS, BILITOT, PROT, ALBUMIN in the last 72 hours. No results for input(s): LIPASE, AMYLASE in  the last 72 hours. High Sensitivity Troponin:   No results for input(s): TROPONINIHS in the last 720 hours.  BNP Invalid input(s): POCBNP D-Dimer No results for input(s): DDIMER in the last 72 hours. Hemoglobin A1C Recent Labs    03/24/20 0132  HGBA1C 6.9*   Fasting Lipid Panel No results for input(s): CHOL, HDL, LDLCALC, TRIG, CHOLHDL, LDLDIRECT in the last 72 hours. Thyroid Function Tests No results for input(s): TSH, T4TOTAL, T3FREE, THYROIDAB in the last 72 hours.  Invalid input(s): FREET3 _____________  PERIPHERAL VASCULAR CATHETERIZATION  Result Date: 03/23/2020  [id number] LOCATION:  FACILITY: [facility] PHYSICIAN: Nanetta Batty, M.D. 20-Dec-1945 DATE OF PROCEDURE:  03/23/2020 DATE OF DISCHARGE: PV Angiogram/Intervention History obtained from chart review.[name] is a 74 y.o. mildly overweight married African-American female mother of 1 living daughter (son committed suicide), grandmother to 6 grandchildren referred by Dr. Katrinka Blazing, her cardiologist, for peripheral vascular evaluation.  In her younger years she worked for the [employer] and then for [employer].  Her risk factors include treated hypertension, diabetes and hyperlipidemia.  She is never had a heart attack or stroke.  She has had a diagnostic cath performed 04/17/2018 that showed some mild CAD.  She had COVID-19 back in January of this year recovered.  She does complain of lifestyle limiting claudication with Dopplers that were performed 01/29/2020 revealing a right ABI of 1.09 and a left of 0.89 with high-frequency signals in both SFAs. Pre Procedure Diagnosis: Peripheral arterial disease Post Procedure Diagnosis: Peripheral arterial disease Operators: Dr. Nanetta Batty Procedures Performed:  1.  Ultrasound-guided right common femoral access  2.  Abdominal aortogram/bilateral iliac angiogram  3.  Contralateral access (secondary catheter placement)  4.  Placement of spider distal protection  device left above-the-knee popliteal artery  5.  Hawk 1 directional atherectomy mid left SFA  6.  Drug-coated balloon angioplasty mid left SFA PROCEDURE DESCRIPTION: The patient was brought to the second floor [facility] cath lab in the the postabsorptive state. She was premedicated with IV Versed and fentanyl. Her right groin was prepped and shaved in usual sterile fashion. Xylocaine 1% was used for local anesthesia. A 5 French sheath was inserted into the right common femoral artery using standard Seldinger technique.  Ultrasound was used to identify the common femoral artery and guide access.  A digital image was captured and placed the patient's chart.  A 5 French pigtail catheters placed the distal dome aorta.  Distal abdominal aortography, bilateral iliac angiography was performed as well as left lower extremity runoff.  On the pigtail was used for the entirety of the case.  Retrograde aortic pressures monitored in the case.  Angiographic Data: 1: Abdominal aortic-fluoroscopically calcified but not aneurysmal or atherosclerotic 2: Left lower extremity-left iliac arteries widely patent.  There was a 70% segmental mid left SFA with two-vessel runoff.  The anterior tibial is occluded 3: Right lower extremity-the right iliac system is widely patent   Ms. Poster has moderately high-grade physiologically significant mid left SFA  stenosis with two-vessel runoff.  We will proceed with [facility] 1 directional atherectomy followed by [facility] using spider distal protection. Procedure Description: Patient received 10 calciums of heparin with an ACT of 273.  A total of 150 cc of contrast was administered the patient.  Contralateral access was obtained with a crossover catheter, Glidewire and versa core wire.  A 7 [place] multipurpose 55 cm Ansell sheath was then advanced over the iliac bifurcation and placed in the left common femoral artery.  A 0.14 x 300 cm length Zilient and 6 g wire was then used to cross the mid left  SFA lesion.  Following this a 6 mm spider distal protection device was then advanced over the wire and deployed in the left above-the-knee popliteal artery.  Then used a [employer] [place] 1 directional atherectomy device and performed multiple circumferential cuts in the mid left SFA.  Following this I performed DCB with a 5 mm x 60 mm long impact Admiral drug-coated balloon at 4 atm for 20 half minutes resulting reduction of a 70% segmental mildly calcified mid left SFA stenosis to 0% residual without dissection.  I then recaptured the spider distal protection device, withdrew the sheath across the bifurcation and exchanged over an 035 versa core wire for short 7 French sheath which was then secured in place.  The patient was already on aspirin Plavix. Final Impression: Successful Hawk 1 directional atherectomy followed by drug-coated balloon angioplasty using spider distal protection in the setting of lifestyle limiting claudication.  The patient will need need to be hydrated overnight.  The sheath will be removed once ACT falls below 170 pressure held.  She was already on dual antiplatelet therapy which will be continued.  She will be discharged home in the morning.  We will obtain lower extremity arterial Doppler studies in our [facility] line office next week and I will see her back 1 to 2 weeks thereafter.  At that point we will discuss staged right SFA intervention. [name], [facility] 03/23/2020 12:56 PM   Disposition   Pt is being discharged home today in good condition.  Follow-up Plans & Appointments     Follow-up Information    [facility] Follow up on 03/30/2020.   Specialty: Cardiology Why: at 2pm for your follow up appt for dopplers Contact information: [address] [place] [address] [phone]       Abelino Derrick, PA-C Follow up on 04/09/2020.   Specialties: Cardiology, Radiology Why: at 10:15am for your follow up appt.  Contact  information: [address] [place] [address] [phone]              Discharge Instructions    Diet - low sodium heart healthy   Complete by: As directed    Discharge instructions   Complete by: As directed    Groin Site Care Refer to this sheet in the next few weeks. These instructions provide you with information on caring for yourself after your procedure. Your caregiver may also give you more specific instructions. Your treatment has been planned according to current medical practices, but problems sometimes occur. Call your caregiver if you have any problems or questions after your procedure. HOME CARE INSTRUCTIONS You may shower 24 hours after the procedure. Remove the bandage (dressing) and gently wash the site with plain soap and water. Gently pat the site dry.  Do not apply powder or lotion to the site.  Do not sit in a bathtub, swimming  pool, or whirlpool for 5 to 7 days.  No bending, squatting, or lifting anything over 10 pounds (4.5 kg) as directed by your caregiver.  Inspect the site at least twice daily.  Do not drive home if you are discharged the same day of the procedure. Have someone else drive you.  You may drive 24 hours after the procedure unless otherwise instructed by your caregiver.  What to expect: Any bruising will usually fade within 1 to 2 weeks.  Blood that collects in the tissue (hematoma) may be painful to the touch. It should usually decrease in size and tenderness within 1 to 2 weeks.  SEEK IMMEDIATE MEDICAL CARE IF: You have unusual pain at the groin site or down the affected leg.  You have redness, warmth, swelling, or pain at the groin site.  You have drainage (other than a small amount of blood on the dressing).  You have chills.  You have a fever or persistent symptoms for more than 72 hours.  You have a fever and your symptoms suddenly get worse.  Your leg becomes pale, cool, tingly, or numb.  You have heavy bleeding from  the site. Hold pressure on the site. .   Increase activity slowly   Complete by: As directed      Discharge Medications   Allergies as of 03/24/2020      Reactions   Metformin And Related Diarrhea      Medication List    TAKE these medications   acetaminophen 500 MG tablet Commonly known as: TYLENOL Take 1,000 mg by mouth every 6 (six) hours as needed (for pain.).   amLODipine 5 MG tablet Commonly known as: NORVASC Take 1 tablet (5 mg total) by mouth daily.   aspirin 81 MG chewable tablet Chew 81 mg by mouth daily.   clopidogrel 75 MG tablet Commonly known as: Plavix Take 1 tablet (75 mg total) by mouth daily.   famotidine 20 MG tablet Commonly known as: PEPCID Take 1 tablet (20 mg total) by mouth 2 (two) times daily. What changed:   when to take this  reasons to take this   Insulin Pen Needle 31G X 5 MM Misc Check blood sugar once daily   isosorbide mononitrate 60 MG 24 hr tablet Commonly known as: IMDUR TAKE 1 TABLET(60 MG) BY MOUTH DAILY What changed: See the new instructions.   Lantus SoloStar 100 UNIT/ML Solostar Pen Generic drug: insulin glargine Inject 15 Units into the skin daily at 10 pm.   nitroGLYCERIN 0.4 MG SL tablet Commonly known as: NITROSTAT PLACE 1 TABLET UNDER THE TONGUE EVERY 5 MINUTES AS NEEDED. IF CHEST PAIN NOT RELIEVED BY THIRD PILL CALL 911 What changed: See the new instructions.   ONE TOUCH DELICA LANCING DEV Misc Check blood sugar once daily   OneTouch Verio test strip Generic drug: glucose blood Use as instructed   OneTouch Verio w/Device Kit 1 each by Does not apply route daily.   quinapril-hydrochlorothiazide 20-12.5 MG tablet Commonly known as: ACCURETIC Take 1 tablet by mouth daily.   rosuvastatin 20 MG tablet Commonly known as: CRESTOR Take 1 tablet (20 mg total) by mouth daily.       Outstanding Labs/Studies   Follow up dopplers   Duration of Discharge Encounter   Greater than 30 minutes including  physician time.  Signed, Reino Bellis, NP 03/24/2020, 10:39 AM

## 2020-03-27 ENCOUNTER — Other Ambulatory Visit: Payer: Self-pay | Admitting: Hematology and Oncology

## 2020-03-27 ENCOUNTER — Inpatient Hospital Stay: Payer: Medicare Other

## 2020-03-27 ENCOUNTER — Other Ambulatory Visit: Payer: Self-pay

## 2020-03-27 ENCOUNTER — Inpatient Hospital Stay: Payer: Medicare Other | Attending: Hematology and Oncology

## 2020-03-27 VITALS — BP 145/50

## 2020-03-27 DIAGNOSIS — D539 Nutritional anemia, unspecified: Secondary | ICD-10-CM

## 2020-03-27 DIAGNOSIS — D631 Anemia in chronic kidney disease: Secondary | ICD-10-CM | POA: Diagnosis not present

## 2020-03-27 DIAGNOSIS — N183 Chronic kidney disease, stage 3 unspecified: Secondary | ICD-10-CM | POA: Insufficient documentation

## 2020-03-27 DIAGNOSIS — Z85038 Personal history of other malignant neoplasm of large intestine: Secondary | ICD-10-CM | POA: Insufficient documentation

## 2020-03-27 LAB — COMPREHENSIVE METABOLIC PANEL
ALT: 13 U/L (ref 0–44)
AST: 20 U/L (ref 15–41)
Albumin: 3.6 g/dL (ref 3.5–5.0)
Alkaline Phosphatase: 72 U/L (ref 38–126)
Anion gap: 9 (ref 5–15)
BUN: 23 mg/dL (ref 8–23)
CO2: 27 mmol/L (ref 22–32)
Calcium: 9.6 mg/dL (ref 8.9–10.3)
Chloride: 104 mmol/L (ref 98–111)
Creatinine, Ser: 1.46 mg/dL — ABNORMAL HIGH (ref 0.44–1.00)
GFR, Estimated: 38 mL/min — ABNORMAL LOW (ref >60)
Glucose, Bld: 227 mg/dL — ABNORMAL HIGH (ref 70–99)
Potassium: 3.5 mmol/L (ref 3.5–5.1)
Sodium: 140 mmol/L (ref 135–145)
Total Bilirubin: 0.7 mg/dL (ref 0.3–1.2)
Total Protein: 7.4 g/dL (ref 6.5–8.1)

## 2020-03-27 LAB — CBC WITH DIFFERENTIAL/PLATELET
Abs Immature Granulocytes: 0.04 10*3/uL (ref 0.00–0.07)
Basophils Absolute: 0.1 10*3/uL (ref 0.0–0.1)
Basophils Relative: 1 %
Eosinophils Absolute: 0.1 10*3/uL (ref 0.0–0.5)
Eosinophils Relative: 2 %
HCT: 27.1 % — ABNORMAL LOW (ref 36.0–46.0)
Hemoglobin: 8.8 g/dL — ABNORMAL LOW (ref 12.0–15.0)
Immature Granulocytes: 1 %
Lymphocytes Relative: 25 %
Lymphs Abs: 1.8 10*3/uL (ref 0.7–4.0)
MCH: 27.9 pg (ref 26.0–34.0)
MCHC: 32.5 g/dL (ref 30.0–36.0)
MCV: 86 fL (ref 80.0–100.0)
Monocytes Absolute: 0.5 10*3/uL (ref 0.1–1.0)
Monocytes Relative: 6 %
Neutro Abs: 4.7 10*3/uL (ref 1.7–7.7)
Neutrophils Relative %: 65 %
Platelets: 210 10*3/uL (ref 150–400)
RBC: 3.15 MIL/uL — ABNORMAL LOW (ref 3.87–5.11)
RDW: 13.2 % (ref 11.5–15.5)
WBC: 7.2 10*3/uL (ref 4.0–10.5)
nRBC: 0 % (ref 0.0–0.2)

## 2020-03-27 MED ORDER — EPOETIN ALFA-EPBX 10000 UNIT/ML IJ SOLN
20000.0000 [IU] | Freq: Once | INTRAMUSCULAR | Status: AC
  Administered 2020-03-27: 20000 [IU] via SUBCUTANEOUS

## 2020-03-27 MED ORDER — EPOETIN ALFA-EPBX 40000 UNIT/ML IJ SOLN: 20000.0000 [IU] | Freq: Once | INTRAMUSCULAR | Status: DC

## 2020-03-27 MED ORDER — EPOETIN ALFA-EPBX 10000 UNIT/ML IJ SOLN
INTRAMUSCULAR | Status: AC
  Filled 2020-03-27: qty 2

## 2020-03-27 NOTE — Patient Instructions (Signed)

## 2020-03-30 ENCOUNTER — Other Ambulatory Visit: Payer: Self-pay | Admitting: Cardiovascular Disease

## 2020-03-30 ENCOUNTER — Other Ambulatory Visit (HOSPITAL_COMMUNITY): Payer: Self-pay | Admitting: Cardiovascular Disease

## 2020-03-30 ENCOUNTER — Ambulatory Visit (HOSPITAL_COMMUNITY)
Admission: RE | Admit: 2020-03-30 | Discharge: 2020-03-30 | Disposition: A | Discharge status: Home / Self Care | Payer: Medicare Other | Source: Ambulatory Visit | Attending: Cardiovascular Disease | Admitting: Cardiovascular Disease

## 2020-03-30 ENCOUNTER — Other Ambulatory Visit: Payer: Self-pay

## 2020-03-30 DIAGNOSIS — I739 Peripheral vascular disease, unspecified: Secondary | ICD-10-CM

## 2020-03-30 DIAGNOSIS — Z9862 Peripheral vascular angioplasty status: Secondary | ICD-10-CM

## 2020-04-09 ENCOUNTER — Ambulatory Visit (INDEPENDENT_AMBULATORY_CARE_PROVIDER_SITE_OTHER): Payer: Medicaid Other | Admitting: Cardiology

## 2020-04-09 ENCOUNTER — Other Ambulatory Visit: Payer: Self-pay

## 2020-04-09 ENCOUNTER — Encounter: Payer: Self-pay | Admitting: Cardiology

## 2020-04-09 VITALS — BP 142/64 | HR 65 | Ht 64.0 in | Wt 142.6 lb

## 2020-04-09 DIAGNOSIS — Z794 Long term (current) use of insulin: Secondary | ICD-10-CM

## 2020-04-09 DIAGNOSIS — I10 Essential (primary) hypertension: Secondary | ICD-10-CM | POA: Diagnosis not present

## 2020-04-09 DIAGNOSIS — E119 Type 2 diabetes mellitus without complications: Secondary | ICD-10-CM | POA: Diagnosis not present

## 2020-04-09 DIAGNOSIS — I739 Peripheral vascular disease, unspecified: Secondary | ICD-10-CM | POA: Diagnosis not present

## 2020-04-09 DIAGNOSIS — N289 Disorder of kidney and ureter, unspecified: Secondary | ICD-10-CM

## 2020-04-09 DIAGNOSIS — E785 Hyperlipidemia, unspecified: Secondary | ICD-10-CM

## 2020-04-09 DIAGNOSIS — N1831 Chronic kidney disease, stage 3a: Secondary | ICD-10-CM

## 2020-04-09 DIAGNOSIS — Z9861 Coronary angioplasty status: Secondary | ICD-10-CM

## 2020-04-09 DIAGNOSIS — I2510 Atherosclerotic heart disease of native coronary artery without angina pectoris: Secondary | ICD-10-CM

## 2020-04-09 LAB — BASIC METABOLIC PANEL
BUN/Creatinine Ratio: 24 (ref 12–28)
BUN: 38 mg/dL — ABNORMAL HIGH (ref 8–27)
CO2: 25 mmol/L (ref 20–29)
Calcium: 9.6 mg/dL (ref 8.7–10.3)
Chloride: 103 mmol/L (ref 96–106)
Creatinine, Ser: 1.61 mg/dL — ABNORMAL HIGH (ref 0.57–1.00)
GFR calc Af Amer: 36 mL/min/{1.73_m2} — ABNORMAL LOW (ref >59)
GFR calc non Af Amer: 32 mL/min/{1.73_m2} — ABNORMAL LOW (ref >59)
Glucose: 321 mg/dL — ABNORMAL HIGH (ref 65–99)
Potassium: 4.1 mmol/L (ref 3.5–5.2)
Sodium: 140 mmol/L (ref 134–144)

## 2020-04-09 NOTE — Assessment & Plan Note (Signed)
Check BMP post PV angiogram

## 2020-04-09 NOTE — Assessment & Plan Note (Signed)
S/P LSFA PTA Dec 2021- f/u dopplers and exam confirmed improvement in circulation.

## 2020-04-09 NOTE — Patient Instructions (Signed)
Medication Instructions:  Continue current medications  *If you need a refill on your cardiac medications before your next appointment, please call your pharmacy*   Lab Work: BMP  If you have labs (blood work) drawn today and your tests are completely normal, you will receive your results only by: Marland Kitchen MyChart Message (if you have MyChart) OR . A paper copy in the mail If you have any lab test that is abnormal or we need to change your treatment, we will call you to review the results.   Testing/Procedures: None ordered   Follow-Up: At [facility], you and your health needs are our priority.  As part of our continuing mission to provide you with exceptional heart care, we have created designated Provider Care Teams.  These Care Teams include your primary Cardiologist (physician) and Advanced Practice Providers (APPs -  Physician Assistants and Nurse Practitioners) who all work together to provide you with the care you need, when you need it.  We recommend signing up for the patient portal called "MyChart".  Sign up information is provided on this After Visit Summary.  MyChart is used to connect with patients for Virtual Visits (Telemedicine).  Patients are able to view lab/test results, encounter notes, upcoming appointments, etc.  Non-urgent messages can be sent to your provider as well.   To learn more about what you can do with MyChart, go to [email or web address].    Your next appointment:   6 month(s)  The format for your next appointment:   In Person  Provider:   You may see Quay Burow, MD or one of the following Advanced Practice Providers on your designated Care Team:    [name], PA-C  [place], [place]  Coletta Memos, [place]

## 2020-04-09 NOTE — Assessment & Plan Note (Signed)
Controlled.  

## 2020-04-09 NOTE — Assessment & Plan Note (Signed)
On statin Rx- PCP follows 

## 2020-04-09 NOTE — Assessment & Plan Note (Signed)
PCP follows 

## 2020-04-09 NOTE — Assessment & Plan Note (Signed)
mRCA PCI with DES Oct 2020.  Residual moderate disease in the CFX, OM, and 90% apical LAD- medical Rx.

## 2020-04-09 NOTE — Progress Notes (Signed)
Cardiology Office Note:    Date:  04/09/2020   ID:  Anne, [name] 13-May-1945, MRN [id number]  PCP:  Mirian Mo, MD  Cardiologist:  Lesleigh Noe, MD  Electrophysiologist:  None   Referring MD: Mirian Mo, MD   CC: she still complains of some Lt calf pain with walking  History of Present Illness:    [name] is a pleasant 74 y.o. female with a hx of CAD-s/p RCA PCI Oct 2020 with moderate residual CAD in the CFX, OM, and dLAD,  hypertension, insulin-dependent diabetes, chronic iron deficiency anemia, and dyslipidemia.  She is followed by Dr. Verdis Prime.  In January 2020 she underwent catheterization that showed moderate coronary disease and was treated medically.  She was studied again in October 2020.  The RCA was evaluated with FFR and she underwent placement of a DES to the mid RCA.  Echocardiogram at that time showed normal LV function.  She has been stable from a cardiac standpoint since.    She was referred to Dr. Allyson Sabal in Nov for evaluation of claudication.  LE a Dopplers suggested significant left lower extremity arterial disease.  On 03/23/2020 she underwent PV angiogram with intervention to her left SFA above the popliteal artery.  Please see Dr. Hazle Coca op note for complete details.  The patient is in the office today as a post PV follow-up.  She is accompanied by her granddaughter.  The patient tells me that her symptoms of left calf pain are better but she admits she still has some pain in his left calf.  To me it did not clearly sound like claudication.  Some of her symptoms were rest symptoms.  Lower extremity Dopplers showed marked improvement on the left compared to pre PTA dopplers.   Past Medical History:  Diagnosis Date  . Allergy   . Anemia   . Asthma   . Blood transfusion without reported diagnosis 12/23/2004   with colon cancer  . Brain tumor (benign) (HCC)   . Colon cancer [facility]) Nov. 27,  2006  . COVID-19 04/2019  . Diabetes  mellitus    Type 2  . Female bladder prolapse   . Gastritis   . GERD (gastroesophageal reflux disease)   . Glaucoma   . Hypercholesterolemia   . Hypertension     Past Surgical History:  Procedure Laterality Date  . ABDOMINAL AORTOGRAM W/LOWER EXTREMITY Left 03/23/2020   Procedure: ABDOMINAL AORTOGRAM W/LOWER EXTREMITY;  Surgeon: Runell Gess, MD;  Location[facility] INVASIVE CV LAB;  Service: Cardiovascular;  Laterality: Left;  . ABDOMINAL HYSTERECTOMY  1976  . BALLOON DILATION N/A 08/23/2017   Procedure: BALLOON DILATION;  Surgeon: Rachael Fee, MD;  Location: [facility];  Service: Endoscopy;  Laterality: N/A;  Esophageal Diltation  . bladder tac    . CARDIAC CATHETERIZATION  2005  . CATARACT EXTRACTION, BILATERAL    . COLONOSCOPY    . COLONOSCOPY W/ BIOPSIES    . CORONARY STENT INTERVENTION N/A 01/17/2019   Procedure: CORONARY STENT INTERVENTION;  Surgeon: Yvonne Kendall, MD;  Location: [facility] INVASIVE CV LAB;  Service: Cardiovascular;  Laterality: N/A;  . EMBOLIZATION Left 03/23/2020   Procedure: EMBOLIZATION;  Surgeon: Runell Gess, MD;  Location: [facility] INVASIVE CV LAB;  Service: Cardiovascular;  Laterality: Left;  SFA AND DCB  . ESOPHAGEAL MANOMETRY N/A 03/20/2015   Procedure: ESOPHAGEAL MANOMETRY (EM);  Surgeon: Iva Boop, MD;  Location: [facility] ENDOSCOPY;  Service: Endoscopy;  Laterality: N/A;  . ESOPHAGOGASTRODUODENOSCOPY (EGD)  WITH PROPOFOL N/A 08/23/2017   Procedure: ESOPHAGOGASTRODUODENOSCOPY (EGD) WITH PROPOFOL;  Surgeon: Milus Banister, MD;  Location: [facility] ENDOSCOPY;  Service: Endoscopy;  Laterality: N/A;  . EYE SURGERY     mole removed left eye  . INTRAVASCULAR PRESSURE WIRE/FFR STUDY N/A 01/17/2019   Procedure: INTRAVASCULAR PRESSURE WIRE/FFR STUDY;  Surgeon: Nelva Bush, MD;  Location: [place] CV LAB;  Service: Cardiovascular;  Laterality: N/A;  . LAPAROSCOPIC NISSEN FUNDOPLICATION  1610  . LEFT HEART CATH AND CORONARY ANGIOGRAPHY N/A 04/17/2018   Procedure:  LEFT HEART CATH AND CORONARY ANGIOGRAPHY;  Surgeon: Belva Crome, MD;  Location: [place] CV LAB;  Service: Cardiovascular;  Laterality: N/A;  . LEFT HEART CATH AND CORONARY ANGIOGRAPHY N/A 01/17/2019   Procedure: LEFT HEART CATH AND CORONARY ANGIOGRAPHY;  Surgeon: Nelva Bush, MD;  Location: [place] CV LAB;  Service: Cardiovascular;  Laterality: N/A;  . repair prolapsedbladder    . RIGHT COLECTOMY  Nov. 27, 2006  . TMJ ARTHROPLASTY    . UPPER GASTROINTESTINAL ENDOSCOPY      Current Medications: Current Meds  Medication Sig  . acetaminophen (TYLENOL) 500 MG tablet Take 1,000 mg by mouth every 6 (six) hours as needed (for pain.).  Marland Kitchen amLODipine (NORVASC) 5 MG tablet Take 1 tablet (5 mg total) by mouth daily.  Marland Kitchen aspirin 81 MG chewable tablet Chew 81 mg by mouth daily.  . Blood Glucose Monitoring Suppl (ONETOUCH VERIO) w/Device KIT 1 each by Does not apply route daily.  . clopidogrel (PLAVIX) 75 MG tablet Take 1 tablet (75 mg total) by mouth daily.  . famotidine (PEPCID) 20 MG tablet Take 1 tablet (20 mg total) by mouth 2 (two) times daily. (Patient taking differently: Take 20 mg by mouth 2 (two) times daily as needed for heartburn or indigestion.)  . glucose blood (ONETOUCH VERIO) test strip Use as instructed  . insulin glargine (LANTUS SOLOSTAR) 100 UNIT/ML Solostar Pen Inject 15 Units into the skin daily at 10 pm.  . Insulin Pen Needle 31G X 5 MM MISC Check blood sugar once daily  . isosorbide mononitrate (IMDUR) 60 MG 24 hr tablet TAKE 1 TABLET(60 MG) BY MOUTH DAILY (Patient taking differently: Take 60 mg by mouth daily.)  . Lancet Devices (ONE TOUCH DELICA LANCING DEV) MISC Check blood sugar once daily  . nitroGLYCERIN (NITROSTAT) 0.4 MG SL tablet PLACE 1 TABLET UNDER THE TONGUE EVERY 5 MINUTES AS NEEDED. IF CHEST PAIN NOT RELIEVED BY THIRD PILL CALL 911 (Patient taking differently: Place 0.4 mg under the tongue every 5 (five) minutes x 3 doses as needed for chest pain.)  .  quinapril-hydrochlorothiazide (ACCURETIC) 20-12.5 MG tablet Take 1 tablet by mouth daily.  . rosuvastatin (CRESTOR) 20 MG tablet Take 1 tablet (20 mg total) by mouth daily.     Allergies:   Metformin and related   Social History   Socioeconomic History  . Marital status: Legally Separated    Spouse name: Not on file  . Number of children: 2  . Years of education: Not on file  . Highest education level: Not on file  Occupational History  . Not on file  Tobacco Use  . Smoking status: Never Smoker  . Smokeless tobacco: Never Used  Vaping Use  . Vaping Use: Never used  Substance and Sexual Activity  . Alcohol use: No  . Drug use: No  . Sexual activity: Not on file  Other Topics Concern  . Not on file  Social History Narrative  . Not  on file   Social Determinants of Health   Financial Resource Strain: Not on file  Food Insecurity: Not on file  Transportation Needs: Not on file  Physical Activity: Not on file  Stress: Not on file  Social Connections: Not on file     Family History: The patient's family history includes Bone cancer in her father; Diabetes in her daughter; Heart attack in her daughter; Hypertension in her daughter; Lymphoma in an other family member; Suicidality in her son. There is no history of Colon cancer or Stomach cancer.  ROS:   Please see the history of present illness.     All other systems reviewed and are negative.  EKGs/Labs/Other Studies Reviewed:    The following studies were reviewed today: LEA dopplers 03/30/2020- Summary:  Left: Marked improvement is noted compared to previous study.   Heterogenous plaque throughout.  Essentially stable 50-74% stenosis in the proximal SFA, distal to ostium.  Patent mid SFA without focal stenosis, s/p atherectomy and angioplasty.  Distal SFA and behind-the-knee, mid popliteal artery now appears 50-74%  stenosed, based on velocities.       See table(s) above for measurements and observations.   See ABI report.   Suggest follow up study in 6 months.   EKG:  EKG is not ordered today.  The ekg ordered 05/07/2019 demonstrates NSR, SB- HR 50  Recent Labs: 03/27/2020: ALT 13; BUN 23; Creatinine, Ser 1.46; Hemoglobin 8.8; Platelets 210; Potassium 3.5; Sodium 140  Recent Lipid Panel    Component Value Date/Time   CHOL 94 (L) 10/30/2019 1401   TRIG 88 10/30/2019 1401   HDL 56 10/30/2019 1401   CHOLHDL 1.7 10/30/2019 1401   CHOLHDL 3.8 07/20/2015 1641   VLDL 74 (H) 07/20/2015 1641   LDLCALC 21 10/30/2019 1401    Physical Exam:    VS:  BP (!) 142/64   Pulse 65   Ht $R'5\' 4"'PH$  (1.626 m)   Wt 142 lb 9.6 oz (64.7 kg)   SpO2 93%   BMI 24.48 kg/m     Wt Readings from Last 3 Encounters:  04/09/20 142 lb 9.6 oz (64.7 kg)  03/23/20 157 lb 10.1 oz (71.5 kg)  03/04/20 144 lb 6.4 oz (65.5 kg)     GEN:  Well nourished, well developed female, in no acute distress HEENT: Normal NECK: No JVD; No carotid bruits CARDIAC: RRR, no murmurs, rubs, gallops RESPIRATORY:  Clear to auscultation without rales, wheezing or rhonchi  ABDOMEN: Soft,  non-distended MUSCULOSKELETAL:  No edema; No deformity - 2+/4 DP pulses bilateraly SKIN: Warm and dry NEUROLOGIC:  Alert and oriented x 3 PSYCHIATRIC:  Normal affect   ASSESSMENT:    Peripheral arterial disease ([place]) S/P LSFA PTA Dec 2021- f/u dopplers and exam confirmed improvement in circulation.  Essential hypertension Controlled  Insulin dependent type 2 diabetes mellitus ([place]) PCP follows  Dyslipidemia, goal LDL below 70 On statin Rx- PCP follows  Chronic kidney disease, stage III (moderate) (HCC) Check BMP post PV angiogram   CAD S/P percutaneous coronary angioplasty mRCA PCI with DES Oct 2020.  Residual moderate disease in the CFX, OM, and 90% apical LAD- medical Rx.  PLAN:    I don't think her current calf discomfort is claudication based on her exam and dopplers study and I reassured her.  F/U dopplers in 6 months, then OV with  Dr [name]. Check BMP today. F/U with Dr Tamala Julian as previously scheduled.    Medication Adjustments/Labs and Tests Ordered: Current medicines are reviewed at  length with the patient today.  Concerns regarding medicines are outlined above.  Orders Placed This Encounter  Procedures  . Basic Metabolic Panel (BMET)   No orders of the defined types were placed in this encounter.   Patient Instructions  Medication Instructions:  Continue current medications  *If you need a refill on your cardiac medications before your next appointment, please call your pharmacy*   Lab Work: [facility]  If you have labs (blood work) drawn today and your tests are completely normal, you will receive your results only by: Marland Kitchen MyChart Message (if you have MyChart) OR . A paper copy in the mail If you have any lab test that is abnormal or we need to change your treatment, we will call you to review the results.   Testing/Procedures: None ordered   Follow-Up: At [facility], you and your health needs are our priority.  As part of our continuing mission to provide you with exceptional heart care, we have created designated Provider Care Teams.  These Care Teams include your primary Cardiologist (physician) and Advanced Practice Providers (APPs -  Physician Assistants and Nurse Practitioners) who all work together to provide you with the care you need, when you need it.  We recommend signing up for the patient portal called "MyChart".  Sign up information is provided on this After Visit Summary.  MyChart is used to connect with patients for Virtual Visits (Telemedicine).  Patients are able to view lab/test results, encounter notes, upcoming appointments, etc.  Non-urgent messages can be sent to your provider as well.   To learn more about what you can do with MyChart, go to [email or web address].    Your next appointment:   6 month(s)  The format for your next appointment:   In Person  Provider:   You may  see Quay Burow, MD or one of the following Advanced Practice Providers on your designated Care Team:    [name], PA-C  [place], [place]  Coletta Memos, FNP         Signed, [name], [place]  04/09/2020 10:31 AM    [place]

## 2020-04-13 ENCOUNTER — Inpatient Hospital Stay: Payer: Medicare Other

## 2020-04-13 ENCOUNTER — Telehealth: Payer: Self-pay | Admitting: Hematology and Oncology

## 2020-04-13 NOTE — Telephone Encounter (Signed)
Rescheduled appointment per 1/3 schedule message. Patient is aware of changes. 

## 2020-04-15 ENCOUNTER — Inpatient Hospital Stay: Payer: Medicare Other | Attending: Hematology and Oncology

## 2020-04-15 ENCOUNTER — Inpatient Hospital Stay: Payer: Medicare Other

## 2020-04-15 ENCOUNTER — Other Ambulatory Visit: Payer: Self-pay

## 2020-04-15 VITALS — BP 152/51 | HR 59 | Resp 16

## 2020-04-15 DIAGNOSIS — D631 Anemia in chronic kidney disease: Secondary | ICD-10-CM | POA: Insufficient documentation

## 2020-04-15 DIAGNOSIS — D539 Nutritional anemia, unspecified: Secondary | ICD-10-CM

## 2020-04-15 DIAGNOSIS — N183 Chronic kidney disease, stage 3 unspecified: Secondary | ICD-10-CM | POA: Diagnosis not present

## 2020-04-15 LAB — COMPREHENSIVE METABOLIC PANEL
ALT: 12 U/L (ref 0–44)
AST: 16 U/L (ref 15–41)
Albumin: 3.7 g/dL (ref 3.5–5.0)
Alkaline Phosphatase: 66 U/L (ref 38–126)
Anion gap: 10 (ref 5–15)
BUN: 37 mg/dL — ABNORMAL HIGH (ref 8–23)
CO2: 25 mmol/L (ref 22–32)
Calcium: 9.9 mg/dL (ref 8.9–10.3)
Chloride: 107 mmol/L (ref 98–111)
Creatinine, Ser: 1.78 mg/dL — ABNORMAL HIGH (ref 0.44–1.00)
GFR, Estimated: 30 mL/min — ABNORMAL LOW (ref >60)
Glucose, Bld: 181 mg/dL — ABNORMAL HIGH (ref 70–99)
Potassium: 3.8 mmol/L (ref 3.5–5.1)
Sodium: 142 mmol/L (ref 135–145)
Total Bilirubin: 0.7 mg/dL (ref 0.3–1.2)
Total Protein: 7.5 g/dL (ref 6.5–8.1)

## 2020-04-15 LAB — CBC WITH DIFFERENTIAL/PLATELET
Abs Immature Granulocytes: 0.01 10*3/uL (ref 0.00–0.07)
Basophils Absolute: 0.1 10*3/uL (ref 0.0–0.1)
Basophils Relative: 1 %
Eosinophils Absolute: 0.1 10*3/uL (ref 0.0–0.5)
Eosinophils Relative: 2 %
HCT: 31.5 % — ABNORMAL LOW (ref 36.0–46.0)
Hemoglobin: 9.8 g/dL — ABNORMAL LOW (ref 12.0–15.0)
Immature Granulocytes: 0 %
Lymphocytes Relative: 29 %
Lymphs Abs: 2.3 10*3/uL (ref 0.7–4.0)
MCH: 27.3 pg (ref 26.0–34.0)
MCHC: 31.1 g/dL (ref 30.0–36.0)
MCV: 87.7 fL (ref 80.0–100.0)
Monocytes Absolute: 0.5 10*3/uL (ref 0.1–1.0)
Monocytes Relative: 7 %
Neutro Abs: 5 10*3/uL (ref 1.7–7.7)
Neutrophils Relative %: 61 %
Platelets: 227 10*3/uL (ref 150–400)
RBC: 3.59 MIL/uL — ABNORMAL LOW (ref 3.87–5.11)
RDW: 14.1 % (ref 11.5–15.5)
WBC: 8 10*3/uL (ref 4.0–10.5)
nRBC: 0 % (ref 0.0–0.2)

## 2020-04-15 MED ORDER — EPOETIN ALFA-EPBX 40000 UNIT/ML IJ SOLN: 20000.0000 [IU] | Freq: Once | INTRAMUSCULAR | Status: DC

## 2020-04-15 MED ORDER — EPOETIN ALFA-EPBX 10000 UNIT/ML IJ SOLN
20000.0000 [IU] | Freq: Once | INTRAMUSCULAR | Status: AC
  Administered 2020-04-15: 20000 [IU] via SUBCUTANEOUS

## 2020-04-15 NOTE — Patient Instructions (Signed)

## 2020-04-18 ENCOUNTER — Other Ambulatory Visit: Payer: Self-pay | Admitting: Interventional Cardiology

## 2020-04-19 ENCOUNTER — Other Ambulatory Visit: Payer: Self-pay | Admitting: Interventional Cardiology

## 2020-04-20 ENCOUNTER — Other Ambulatory Visit: Payer: Self-pay | Admitting: Family Medicine

## 2020-04-25 DIAGNOSIS — Z1152 Encounter for screening for COVID-19: Secondary | ICD-10-CM | POA: Diagnosis not present

## 2020-05-04 ENCOUNTER — Inpatient Hospital Stay: Payer: Medicare Other

## 2020-05-04 ENCOUNTER — Other Ambulatory Visit: Payer: Self-pay

## 2020-05-04 VITALS — BP 148/52 | HR 58 | Resp 16

## 2020-05-04 DIAGNOSIS — D631 Anemia in chronic kidney disease: Secondary | ICD-10-CM | POA: Diagnosis not present

## 2020-05-04 DIAGNOSIS — N183 Chronic kidney disease, stage 3 unspecified: Secondary | ICD-10-CM

## 2020-05-04 DIAGNOSIS — D539 Nutritional anemia, unspecified: Secondary | ICD-10-CM

## 2020-05-04 LAB — COMPREHENSIVE METABOLIC PANEL
ALT: 14 U/L (ref 0–44)
AST: 21 U/L (ref 15–41)
Albumin: 3.8 g/dL (ref 3.5–5.0)
Alkaline Phosphatase: 66 U/L (ref 38–126)
Anion gap: 8 (ref 5–15)
BUN: 41 mg/dL — ABNORMAL HIGH (ref 8–23)
CO2: 25 mmol/L (ref 22–32)
Calcium: 9.2 mg/dL (ref 8.9–10.3)
Chloride: 107 mmol/L (ref 98–111)
Creatinine, Ser: 1.71 mg/dL — ABNORMAL HIGH (ref 0.44–1.00)
GFR, Estimated: 31 mL/min — ABNORMAL LOW (ref >60)
Glucose, Bld: 138 mg/dL — ABNORMAL HIGH (ref 70–99)
Potassium: 4.2 mmol/L (ref 3.5–5.1)
Sodium: 140 mmol/L (ref 135–145)
Total Bilirubin: 0.6 mg/dL (ref 0.3–1.2)
Total Protein: 7.3 g/dL (ref 6.5–8.1)

## 2020-05-04 LAB — CBC WITH DIFFERENTIAL/PLATELET
Abs Immature Granulocytes: 0.02 10*3/uL (ref 0.00–0.07)
Basophils Absolute: 0 10*3/uL (ref 0.0–0.1)
Basophils Relative: 1 %
Eosinophils Absolute: 0.1 10*3/uL (ref 0.0–0.5)
Eosinophils Relative: 2 %
HCT: 32.4 % — ABNORMAL LOW (ref 36.0–46.0)
Hemoglobin: 10.2 g/dL — ABNORMAL LOW (ref 12.0–15.0)
Immature Granulocytes: 0 %
Lymphocytes Relative: 29 %
Lymphs Abs: 2.1 10*3/uL (ref 0.7–4.0)
MCH: 27.5 pg (ref 26.0–34.0)
MCHC: 31.5 g/dL (ref 30.0–36.0)
MCV: 87.3 fL (ref 80.0–100.0)
Monocytes Absolute: 0.5 10*3/uL (ref 0.1–1.0)
Monocytes Relative: 6 %
Neutro Abs: 4.5 10*3/uL (ref 1.7–7.7)
Neutrophils Relative %: 62 %
Platelets: 233 10*3/uL (ref 150–400)
RBC: 3.71 MIL/uL — ABNORMAL LOW (ref 3.87–5.11)
RDW: 14.1 % (ref 11.5–15.5)
WBC: 7.3 10*3/uL (ref 4.0–10.5)
nRBC: 0 % (ref 0.0–0.2)

## 2020-05-04 MED ORDER — EPOETIN ALFA-EPBX 10000 UNIT/ML IJ SOLN
20000.0000 [IU] | Freq: Once | INTRAMUSCULAR | Status: AC
  Administered 2020-05-04: 20000 [IU] via SUBCUTANEOUS

## 2020-05-04 MED ORDER — EPOETIN ALFA-EPBX 40000 UNIT/ML IJ SOLN: 20000.0000 [IU] | Freq: Once | INTRAMUSCULAR | Status: DC

## 2020-05-04 MED ORDER — EPOETIN ALFA-EPBX 10000 UNIT/ML IJ SOLN
INTRAMUSCULAR | Status: AC
  Filled 2020-05-04: qty 2

## 2020-05-04 NOTE — Patient Instructions (Signed)

## 2020-05-25 ENCOUNTER — Inpatient Hospital Stay: Payer: Medicare Other | Attending: Hematology and Oncology | Admitting: Hematology and Oncology

## 2020-05-25 ENCOUNTER — Other Ambulatory Visit: Payer: Self-pay

## 2020-05-25 ENCOUNTER — Inpatient Hospital Stay: Payer: Medicare Other

## 2020-05-25 ENCOUNTER — Telehealth: Payer: Self-pay | Admitting: Hematology and Oncology

## 2020-05-25 ENCOUNTER — Encounter: Payer: Self-pay | Admitting: Hematology and Oncology

## 2020-05-25 DIAGNOSIS — N183 Chronic kidney disease, stage 3 unspecified: Secondary | ICD-10-CM | POA: Diagnosis not present

## 2020-05-25 DIAGNOSIS — D539 Nutritional anemia, unspecified: Secondary | ICD-10-CM | POA: Diagnosis not present

## 2020-05-25 DIAGNOSIS — D631 Anemia in chronic kidney disease: Secondary | ICD-10-CM | POA: Insufficient documentation

## 2020-05-25 DIAGNOSIS — I10 Essential (primary) hypertension: Secondary | ICD-10-CM

## 2020-05-25 DIAGNOSIS — N1831 Chronic kidney disease, stage 3a: Secondary | ICD-10-CM

## 2020-05-25 LAB — COMPREHENSIVE METABOLIC PANEL
ALT: 12 U/L (ref 0–44)
AST: 18 U/L (ref 15–41)
Albumin: 4.1 g/dL (ref 3.5–5.0)
Alkaline Phosphatase: 72 U/L (ref 38–126)
Anion gap: 8 (ref 5–15)
BUN: 23 mg/dL (ref 8–23)
CO2: 25 mmol/L (ref 22–32)
Calcium: 9.7 mg/dL (ref 8.9–10.3)
Chloride: 108 mmol/L (ref 98–111)
Creatinine, Ser: 1.29 mg/dL — ABNORMAL HIGH (ref 0.44–1.00)
GFR, Estimated: 44 mL/min — ABNORMAL LOW (ref >60)
Glucose, Bld: 178 mg/dL — ABNORMAL HIGH (ref 70–99)
Potassium: 3.8 mmol/L (ref 3.5–5.1)
Sodium: 141 mmol/L (ref 135–145)
Total Bilirubin: 0.7 mg/dL (ref 0.3–1.2)
Total Protein: 7.8 g/dL (ref 6.5–8.1)

## 2020-05-25 LAB — CBC WITH DIFFERENTIAL/PLATELET
Abs Immature Granulocytes: 0.01 10*3/uL (ref 0.00–0.07)
Basophils Absolute: 0.1 10*3/uL (ref 0.0–0.1)
Basophils Relative: 1 %
Eosinophils Absolute: 0.1 10*3/uL (ref 0.0–0.5)
Eosinophils Relative: 2 %
HCT: 36.2 % (ref 36.0–46.0)
Hemoglobin: 11.4 g/dL — ABNORMAL LOW (ref 12.0–15.0)
Immature Granulocytes: 0 %
Lymphocytes Relative: 33 %
Lymphs Abs: 2.2 10*3/uL (ref 0.7–4.0)
MCH: 27.2 pg (ref 26.0–34.0)
MCHC: 31.5 g/dL (ref 30.0–36.0)
MCV: 86.4 fL (ref 80.0–100.0)
Monocytes Absolute: 0.4 10*3/uL (ref 0.1–1.0)
Monocytes Relative: 6 %
Neutro Abs: 3.8 10*3/uL (ref 1.7–7.7)
Neutrophils Relative %: 58 %
Platelets: 188 10*3/uL (ref 150–400)
RBC: 4.19 MIL/uL (ref 3.87–5.11)
RDW: 14.1 % (ref 11.5–15.5)
WBC: 6.6 10*3/uL (ref 4.0–10.5)
nRBC: 0 % (ref 0.0–0.2)

## 2020-05-25 NOTE — Progress Notes (Signed)
[place] OFFICE PROGRESS NOTE  [name], MD  ASSESSMENT & PLAN:  Deficiency anemia She has excellent response to ESA We will skip a dose today I will keep the dose at 20,000 units but will probably lengthen her treatment to every 4 weeks I will reschedule her next blood work and ESA injection a week from now I will see her in 4 months  Essential hypertension She has intermittent elevated blood pressure Her blood pressure is elevated today but she is not symptomatic Observe closely for now  Chronic kidney disease, stage III (moderate) ([place]) She has intermittent acute on chronic renal failure We discussed the importance of close monitoring and follow-up by nephrologist along with risk factor modifications Renal function is stable so far   No orders of the defined types were placed in this encounter.   The total time spent in the appointment was 20 minutes encounter with patients including review of chart and various tests results, discussions about plan of care and coordination of care plan   All questions were answered. The patient knows to call the clinic with any problems, questions or concerns. No barriers to learning was detected.    Heath Lark, MD 2/14/20221:25 PM  INTERVAL HISTORY: Anne Vazguez. [place] 75 y.o. female returns for further follow-up on anemia chronic kidney disease Since last time I saw her, she feels well Her blood pressure control at home is good but her blood pressure is noted to be elevated today She denies recent headaches or blurriness of vision The patient denies any recent signs or symptoms of bleeding such as spontaneous epistaxis, hematuria or hematochezia.   SUMMARY OF HEMATOLOGIC HISTORY: The patient was seen by multiple different physicians in the past She was originally seen by Dr. Ralene Ok, and then Dr. Juliann Mule, and last seen here in 2016 by Dr. Benay Spice She has remote history of colon cancer diagnosed in 2006, stage II (T3  N0), status post a right colectomy 03/07/2005. She received adjuvant FOLFOX for 4 cycles followed by 8 cycles of 5-FU/leucovorin.  She had imaging study with CT scan of the abdomen and pelvis in 2019 which show no evidence of recurrence.  Her last colonoscopy was on July 14, 2016 which show no evidence of disease.  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  No other abnormalities were noted She also have history of meningioma and follow with neurosurgery for periodic imaging study.  Her last MRI of the brain was in Aug 11, 2015 which show stable meningioma  She was found to have abnormal CBC from recent blood work.  On review of her blood work, it is noted that she has progressive anemia over the last few years, low was around 8.8 In July of this year, she had iron studies which showed normal iron storage.  She is noted to have progressive renal disease. Starting around 2018, she had progressive renal dysfunction, with serum creatinine fluctuate usually under 2 but in January of this year, her creatinine went up to as high as 2.2. Estimated EGFR put her at chronic kidney disease stage III.  She has multiple cardiovascular risk factors including poorly controlled diabetes, peripheral vascular disease, chronic kidney disease, coronary artery disease and hypertension  She denies recent chest pain on exertion, shortness of breath on minimal exertion, pre-syncopal episodes, or palpitations. She had not noticed any recent bleeding such as epistaxis, hematuria or hematochezia The patient  denies over the counter NSAID ingestion. She is on antiplatelets agents.  She denies any pica and eats a variety of diet. She had received blood transfusion when she was diagnosed with colon cancer Her granddaughter noticed recurrent falls.  She denies generalized weakness.  She denies peripheral neuropathy from prior treatment or diabetes She is  started on Epoetin injection for anemia chronic kidney disease on February 10, 2020  I have reviewed the past medical history, past surgical history, social history and family history with the patient and they are unchanged from previous note.  ALLERGIES:  is allergic to metformin and related.  MEDICATIONS:  Current Outpatient Medications  Medication Sig Dispense Refill  . acetaminophen (TYLENOL) 500 MG tablet Take 1,000 mg by mouth every 6 (six) hours as needed (for pain.).    Marland Kitchen amLODipine (NORVASC) 5 MG tablet TAKE 1 TABLET(5 MG) BY MOUTH DAILY 90 tablet 3  . aspirin 81 MG chewable tablet Chew 81 mg by mouth daily.    . Blood Glucose Monitoring Suppl (ONETOUCH VERIO) w/Device KIT 1 each by Does not apply route daily. 1 kit 0  . clopidogrel (PLAVIX) 75 MG tablet Take 1 tablet (75 mg total) by mouth daily. 90 tablet 2  . famotidine (PEPCID) 20 MG tablet Take 1 tablet (20 mg total) by mouth 2 (two) times daily as needed for heartburn or indigestion. 90 tablet 2  . glucose blood (ONETOUCH VERIO) test strip Use as instructed 100 each 12  . insulin glargine (LANTUS SOLOSTAR) 100 UNIT/ML Solostar Pen Inject 15 Units into the skin daily at 10 pm. 15 mL 1  . Insulin Pen Needle 31G X 5 MM MISC Check blood sugar once daily 100 each 2  . isosorbide mononitrate (IMDUR) 60 MG 24 hr tablet TAKE 1 TABLET(60 MG) BY MOUTH DAILY (Patient taking differently: Take 60 mg by mouth daily.) 90 tablet 3  . Lancet Devices (ONE TOUCH DELICA LANCING DEV) MISC Check blood sugar once daily 1 each 0  . nitroGLYCERIN (NITROSTAT) 0.4 MG SL tablet PLACE 1 TABLET UNDER THE TONGUE EVERY 5 MINUTES AS NEEDED. IF CHEST PAIN NOT RELIEVED BY THIRD PILL CALL 911 (Patient taking differently: Place 0.4 mg under the tongue every 5 (five) minutes x 3 doses as needed for chest pain.) 25 tablet 7  . quinapril-hydrochlorothiazide (ACCURETIC) 20-12.5 MG tablet TAKE 1 TABLET BY MOUTH DAILY 90 tablet 3  . rosuvastatin (CRESTOR) 20 MG tablet Take  1 tablet (20 mg total) by mouth daily. 90 tablet 3   No current facility-administered medications for this visit.     REVIEW OF SYSTEMS:   Constitutional: Denies fevers, chills or night sweats Eyes: Denies blurriness of vision Ears, nose, mouth, throat, and face: Denies mucositis or sore throat Respiratory: Denies cough, dyspnea or wheezes Cardiovascular: Denies palpitation, chest discomfort or lower extremity swelling Gastrointestinal:  Denies nausea, heartburn or change in bowel habits Skin: Denies abnormal skin rashes Lymphatics: Denies new lymphadenopathy or easy bruising Neurological:Denies numbness, tingling or new weaknesses Behavioral/Psych: Mood is stable, no new changes  All other systems were reviewed with the patient and are negative.  PHYSICAL EXAMINATION: ECOG PERFORMANCE STATUS: 0 - Asymptomatic  Vitals:   05/25/20 1214  BP: (!) 175/53  Pulse: 66  Resp: 18  Temp: 97.7 F (36.5 C)  SpO2: 100%   Filed Weights   05/25/20 1214  Weight: 143 lb 9.6 oz (65.1 kg)    GENERAL:alert, no distress and comfortable NEURO: alert & oriented x 3 with fluent speech, no  focal motor/sensory deficits  LABORATORY DATA:  I have reviewed the data as listed     Component Value Date/Time   NA 141 05/25/2020 1151   NA 140 04/09/2020 1024   NA 143 05/15/2014 1154   K 3.8 05/25/2020 1151   K 3.6 05/15/2014 1154   CL 108 05/25/2020 1151   CL 101 05/11/2012 1544   CO2 25 05/25/2020 1151   CO2 24 05/15/2014 1154   GLUCOSE 178 (H) 05/25/2020 1151   GLUCOSE 143 (H) 05/15/2014 1154   GLUCOSE 272 (H) 05/11/2012 1544   BUN 23 05/25/2020 1151   BUN 38 (H) 04/09/2020 1024   BUN 17.3 05/15/2014 1154   CREATININE 1.29 (H) 05/25/2020 1151   CREATININE 1.06 (H) 07/20/2015 1641   CREATININE 1.0 05/15/2014 1154   CALCIUM 9.7 05/25/2020 1151   CALCIUM 9.6 05/15/2014 1154   PROT 7.8 05/25/2020 1151   PROT 7.0 05/15/2014 1154   ALBUMIN 4.1 05/25/2020 1151   ALBUMIN 3.7 05/15/2014 1154    AST 18 05/25/2020 1151   AST 13 05/15/2014 1154   ALT 12 05/25/2020 1151   ALT 9 05/15/2014 1154   ALKPHOS 72 05/25/2020 1151   ALKPHOS 94 05/15/2014 1154   BILITOT 0.7 05/25/2020 1151   BILITOT 0.68 05/15/2014 1154   GFRNONAA 44 (L) 05/25/2020 1151   GFRNONAA 54 (L) 07/20/2015 1641   GFRAA 36 (L) 04/09/2020 1024   GFRAA 62 07/20/2015 1641    No results found for: SPEP, UPEP  Lab Results  Component Value Date   WBC 6.6 05/25/2020   NEUTROABS 3.8 05/25/2020   HGB 11.4 (L) 05/25/2020   HCT 36.2 05/25/2020   MCV 86.4 05/25/2020   PLT 188 05/25/2020      Chemistry      Component Value Date/Time   NA 141 05/25/2020 1151   NA 140 04/09/2020 1024   NA 143 05/15/2014 1154   K 3.8 05/25/2020 1151   K 3.6 05/15/2014 1154   CL 108 05/25/2020 1151   CL 101 05/11/2012 1544   CO2 25 05/25/2020 1151   CO2 24 05/15/2014 1154   BUN 23 05/25/2020 1151   BUN 38 (H) 04/09/2020 1024   BUN 17.3 05/15/2014 1154   CREATININE 1.29 (H) 05/25/2020 1151   CREATININE 1.06 (H) 07/20/2015 1641   CREATININE 1.0 05/15/2014 1154      Component Value Date/Time   CALCIUM 9.7 05/25/2020 1151   CALCIUM 9.6 05/15/2014 1154   ALKPHOS 72 05/25/2020 1151   ALKPHOS 94 05/15/2014 1154   AST 18 05/25/2020 1151   AST 13 05/15/2014 1154   ALT 12 05/25/2020 1151   ALT 9 05/15/2014 1154   BILITOT 0.7 05/25/2020 1151   BILITOT 0.68 05/15/2014 1154

## 2020-05-25 NOTE — Assessment & Plan Note (Signed)
She has excellent response to ESA We will skip a dose today I will keep the dose at 20,000 units but will probably lengthen her treatment to every 4 weeks I will reschedule her next blood work and ESA injection a week from now I will see her in 4 months

## 2020-05-25 NOTE — Assessment & Plan Note (Signed)
She has intermittent elevated blood pressure Her blood pressure is elevated today but she is not symptomatic Observe closely for now

## 2020-05-25 NOTE — Assessment & Plan Note (Signed)
She has intermittent acute on chronic renal failure We discussed the importance of close monitoring and follow-up by nephrologist along with risk factor modifications Renal function is stable so far 

## 2020-05-25 NOTE — Telephone Encounter (Signed)
Scheduled appts per 2/14 sch msg. Gave pt a print out of AVS.  

## 2020-06-03 ENCOUNTER — Inpatient Hospital Stay: Payer: Medicare Other

## 2020-06-03 ENCOUNTER — Other Ambulatory Visit: Payer: Self-pay

## 2020-06-03 VITALS — BP 151/60 | HR 65 | Resp 18

## 2020-06-03 DIAGNOSIS — N183 Chronic kidney disease, stage 3 unspecified: Secondary | ICD-10-CM

## 2020-06-03 DIAGNOSIS — D539 Nutritional anemia, unspecified: Secondary | ICD-10-CM

## 2020-06-03 DIAGNOSIS — D631 Anemia in chronic kidney disease: Secondary | ICD-10-CM | POA: Diagnosis not present

## 2020-06-03 DIAGNOSIS — I10 Essential (primary) hypertension: Secondary | ICD-10-CM | POA: Diagnosis not present

## 2020-06-03 LAB — CBC WITH DIFFERENTIAL/PLATELET
Abs Immature Granulocytes: 0.03 10*3/uL (ref 0.00–0.07)
Basophils Absolute: 0 10*3/uL (ref 0.0–0.1)
Basophils Relative: 0 %
Eosinophils Absolute: 0.2 10*3/uL (ref 0.0–0.5)
Eosinophils Relative: 3 %
HCT: 30.5 % — ABNORMAL LOW (ref 36.0–46.0)
Hemoglobin: 9.8 g/dL — ABNORMAL LOW (ref 12.0–15.0)
Immature Granulocytes: 0 %
Lymphocytes Relative: 21 %
Lymphs Abs: 1.5 10*3/uL (ref 0.7–4.0)
MCH: 27.3 pg (ref 26.0–34.0)
MCHC: 32.1 g/dL (ref 30.0–36.0)
MCV: 85 fL (ref 80.0–100.0)
Monocytes Absolute: 0.7 10*3/uL (ref 0.1–1.0)
Monocytes Relative: 10 %
Neutro Abs: 4.7 10*3/uL (ref 1.7–7.7)
Neutrophils Relative %: 66 %
Platelets: 193 10*3/uL (ref 150–400)
RBC: 3.59 MIL/uL — ABNORMAL LOW (ref 3.87–5.11)
RDW: 14.6 % (ref 11.5–15.5)
WBC: 7.1 10*3/uL (ref 4.0–10.5)
nRBC: 0 % (ref 0.0–0.2)

## 2020-06-03 LAB — COMPREHENSIVE METABOLIC PANEL
ALT: 10 U/L (ref 0–44)
AST: 14 U/L — ABNORMAL LOW (ref 15–41)
Albumin: 3.4 g/dL — ABNORMAL LOW (ref 3.5–5.0)
Alkaline Phosphatase: 65 U/L (ref 38–126)
Anion gap: 6 (ref 5–15)
BUN: 20 mg/dL (ref 8–23)
CO2: 27 mmol/L (ref 22–32)
Calcium: 8.6 mg/dL — ABNORMAL LOW (ref 8.9–10.3)
Chloride: 110 mmol/L (ref 98–111)
Creatinine, Ser: 1.22 mg/dL — ABNORMAL HIGH (ref 0.44–1.00)
GFR, Estimated: 47 mL/min — ABNORMAL LOW (ref >60)
Glucose, Bld: 151 mg/dL — ABNORMAL HIGH (ref 70–99)
Potassium: 3.6 mmol/L (ref 3.5–5.1)
Sodium: 143 mmol/L (ref 135–145)
Total Bilirubin: 0.5 mg/dL (ref 0.3–1.2)
Total Protein: 6.5 g/dL (ref 6.5–8.1)

## 2020-06-03 MED ORDER — EPOETIN ALFA-EPBX 10000 UNIT/ML IJ SOLN
20000.0000 [IU] | Freq: Once | INTRAMUSCULAR | Status: AC
  Administered 2020-06-03: 20000 [IU] via SUBCUTANEOUS

## 2020-06-03 MED ORDER — EPOETIN ALFA-EPBX 10000 UNIT/ML IJ SOLN
INTRAMUSCULAR | Status: AC
  Filled 2020-06-03: qty 2

## 2020-06-30 ENCOUNTER — Telehealth: Payer: Self-pay | Admitting: Hematology and Oncology

## 2020-06-30 NOTE — Telephone Encounter (Signed)
Called and spoke with patient to r/s 3/23 appt per her request. Confirmed new date and time

## 2020-07-01 ENCOUNTER — Inpatient Hospital Stay: Payer: Medicare Other

## 2020-07-06 ENCOUNTER — Other Ambulatory Visit: Payer: Self-pay

## 2020-07-06 ENCOUNTER — Inpatient Hospital Stay: Payer: Medicare Other

## 2020-07-06 ENCOUNTER — Inpatient Hospital Stay: Payer: Medicare Other | Attending: Hematology and Oncology

## 2020-07-06 VITALS — BP 152/68 | HR 68 | Temp 98.2°F | Resp 18

## 2020-07-06 DIAGNOSIS — N183 Chronic kidney disease, stage 3 unspecified: Secondary | ICD-10-CM | POA: Insufficient documentation

## 2020-07-06 DIAGNOSIS — D631 Anemia in chronic kidney disease: Secondary | ICD-10-CM | POA: Diagnosis not present

## 2020-07-06 DIAGNOSIS — D539 Nutritional anemia, unspecified: Secondary | ICD-10-CM

## 2020-07-06 LAB — COMPREHENSIVE METABOLIC PANEL
ALT: 17 U/L (ref 0–44)
AST: 21 U/L (ref 15–41)
Albumin: 3.7 g/dL (ref 3.5–5.0)
Alkaline Phosphatase: 75 U/L (ref 38–126)
Anion gap: 11 (ref 5–15)
BUN: 34 mg/dL — ABNORMAL HIGH (ref 8–23)
CO2: 27 mmol/L (ref 22–32)
Calcium: 9 mg/dL (ref 8.9–10.3)
Chloride: 104 mmol/L (ref 98–111)
Creatinine, Ser: 1.64 mg/dL — ABNORMAL HIGH (ref 0.44–1.00)
GFR, Estimated: 33 mL/min — ABNORMAL LOW (ref >60)
Glucose, Bld: 153 mg/dL — ABNORMAL HIGH (ref 70–99)
Potassium: 3.9 mmol/L (ref 3.5–5.1)
Sodium: 142 mmol/L (ref 135–145)
Total Bilirubin: 0.7 mg/dL (ref 0.3–1.2)
Total Protein: 7.2 g/dL (ref 6.5–8.1)

## 2020-07-06 LAB — CBC WITH DIFFERENTIAL/PLATELET
Abs Immature Granulocytes: 0.02 10*3/uL (ref 0.00–0.07)
Basophils Absolute: 0.1 10*3/uL (ref 0.0–0.1)
Basophils Relative: 1 %
Eosinophils Absolute: 0.1 10*3/uL (ref 0.0–0.5)
Eosinophils Relative: 2 %
HCT: 32 % — ABNORMAL LOW (ref 36.0–46.0)
Hemoglobin: 10.2 g/dL — ABNORMAL LOW (ref 12.0–15.0)
Immature Granulocytes: 0 %
Lymphocytes Relative: 27 %
Lymphs Abs: 2.3 10*3/uL (ref 0.7–4.0)
MCH: 27.3 pg (ref 26.0–34.0)
MCHC: 31.9 g/dL (ref 30.0–36.0)
MCV: 85.8 fL (ref 80.0–100.0)
Monocytes Absolute: 0.6 10*3/uL (ref 0.1–1.0)
Monocytes Relative: 6 %
Neutro Abs: 5.6 10*3/uL (ref 1.7–7.7)
Neutrophils Relative %: 64 %
Platelets: 221 10*3/uL (ref 150–400)
RBC: 3.73 MIL/uL — ABNORMAL LOW (ref 3.87–5.11)
RDW: 14.2 % (ref 11.5–15.5)
WBC: 8.7 10*3/uL (ref 4.0–10.5)
nRBC: 0 % (ref 0.0–0.2)

## 2020-07-06 MED ORDER — EPOETIN ALFA-EPBX 10000 UNIT/ML IJ SOLN
INTRAMUSCULAR | Status: AC
  Filled 2020-07-06: qty 2

## 2020-07-06 MED ORDER — EPOETIN ALFA-EPBX 10000 UNIT/ML IJ SOLN
20000.0000 [IU] | Freq: Once | INTRAMUSCULAR | Status: AC
  Administered 2020-07-06: 20000 [IU] via SUBCUTANEOUS

## 2020-07-06 NOTE — Patient Instructions (Signed)

## 2020-07-10 ENCOUNTER — Telehealth: Payer: Self-pay

## 2020-07-10 NOTE — Telephone Encounter (Signed)
Returned call to Ms. O[place]s and spoke with [place]. X 2 days ago [name] started complaining of headache and ear issues. She thought maybe it was a side effect of the Epoetin injection. Now she thinks it may be the weather change/ pollen. She will take tylenol for headache. She try benadryl and Claritin to see if it helps. Ask her call the office back if needed. She verbalized understanding.  FYI

## 2020-07-18 ENCOUNTER — Other Ambulatory Visit: Payer: Self-pay | Admitting: Interventional Cardiology

## 2020-07-29 ENCOUNTER — Inpatient Hospital Stay: Payer: Medicare Other

## 2020-07-29 ENCOUNTER — Other Ambulatory Visit: Payer: Self-pay

## 2020-07-29 ENCOUNTER — Inpatient Hospital Stay: Payer: Medicare Other | Attending: Hematology and Oncology

## 2020-07-29 VITALS — BP 152/54 | HR 64 | Resp 20

## 2020-07-29 DIAGNOSIS — N183 Chronic kidney disease, stage 3 unspecified: Secondary | ICD-10-CM | POA: Diagnosis present

## 2020-07-29 DIAGNOSIS — D631 Anemia in chronic kidney disease: Secondary | ICD-10-CM | POA: Diagnosis present

## 2020-07-29 DIAGNOSIS — D539 Nutritional anemia, unspecified: Secondary | ICD-10-CM

## 2020-07-29 LAB — CBC WITH DIFFERENTIAL/PLATELET
Abs Immature Granulocytes: 0.03 10*3/uL (ref 0.00–0.07)
Basophils Absolute: 0.1 10*3/uL (ref 0.0–0.1)
Basophils Relative: 1 %
Eosinophils Absolute: 0.2 10*3/uL (ref 0.0–0.5)
Eosinophils Relative: 3 %
HCT: 33.6 % — ABNORMAL LOW (ref 36.0–46.0)
Hemoglobin: 10.7 g/dL — ABNORMAL LOW (ref 12.0–15.0)
Immature Granulocytes: 0 %
Lymphocytes Relative: 36 %
Lymphs Abs: 2.7 10*3/uL (ref 0.7–4.0)
MCH: 27.5 pg (ref 26.0–34.0)
MCHC: 31.8 g/dL (ref 30.0–36.0)
MCV: 86.4 fL (ref 80.0–100.0)
Monocytes Absolute: 0.4 10*3/uL (ref 0.1–1.0)
Monocytes Relative: 6 %
Neutro Abs: 4.2 10*3/uL (ref 1.7–7.7)
Neutrophils Relative %: 54 %
Platelets: 188 10*3/uL (ref 150–400)
RBC: 3.89 MIL/uL (ref 3.87–5.11)
RDW: 14.3 % (ref 11.5–15.5)
WBC: 7.6 10*3/uL (ref 4.0–10.5)
nRBC: 0 % (ref 0.0–0.2)

## 2020-07-29 LAB — COMPREHENSIVE METABOLIC PANEL
ALT: 15 U/L (ref 0–44)
AST: 21 U/L (ref 15–41)
Albumin: 3.8 g/dL (ref 3.5–5.0)
Alkaline Phosphatase: 77 U/L (ref 38–126)
Anion gap: 10 (ref 5–15)
BUN: 34 mg/dL — ABNORMAL HIGH (ref 8–23)
CO2: 25 mmol/L (ref 22–32)
Calcium: 9.5 mg/dL (ref 8.9–10.3)
Chloride: 107 mmol/L (ref 98–111)
Creatinine, Ser: 1.64 mg/dL — ABNORMAL HIGH (ref 0.44–1.00)
GFR, Estimated: 33 mL/min — ABNORMAL LOW (ref >60)
Glucose, Bld: 165 mg/dL — ABNORMAL HIGH (ref 70–99)
Potassium: 3.8 mmol/L (ref 3.5–5.1)
Sodium: 142 mmol/L (ref 135–145)
Total Bilirubin: 0.7 mg/dL (ref 0.3–1.2)
Total Protein: 7.4 g/dL (ref 6.5–8.1)

## 2020-07-29 MED ORDER — EPOETIN ALFA-EPBX 10000 UNIT/ML IJ SOLN
INTRAMUSCULAR | Status: AC
  Filled 2020-07-29: qty 2

## 2020-07-29 MED ORDER — EPOETIN ALFA-EPBX 10000 UNIT/ML IJ SOLN
20000.0000 [IU] | Freq: Once | INTRAMUSCULAR | Status: AC
  Administered 2020-07-29: 20000 [IU] via SUBCUTANEOUS

## 2020-07-29 NOTE — Patient Instructions (Signed)

## 2020-08-17 DIAGNOSIS — D631 Anemia in chronic kidney disease: Secondary | ICD-10-CM | POA: Diagnosis not present

## 2020-08-17 DIAGNOSIS — E559 Vitamin D deficiency, unspecified: Secondary | ICD-10-CM | POA: Diagnosis not present

## 2020-08-17 DIAGNOSIS — I129 Hypertensive chronic kidney disease with stage 1 through stage 4 chronic kidney disease, or unspecified chronic kidney disease: Secondary | ICD-10-CM | POA: Diagnosis not present

## 2020-08-17 DIAGNOSIS — C189 Malignant neoplasm of colon, unspecified: Secondary | ICD-10-CM | POA: Diagnosis not present

## 2020-08-17 DIAGNOSIS — N1832 Chronic kidney disease, stage 3b: Secondary | ICD-10-CM | POA: Diagnosis not present

## 2020-08-25 ENCOUNTER — Telehealth: Payer: Self-pay | Admitting: Hematology and Oncology

## 2020-08-25 NOTE — Telephone Encounter (Signed)
Rescheduled per provider. Called and spoke with pt confirmed 5/24 appts

## 2020-09-01 ENCOUNTER — Inpatient Hospital Stay: Payer: Medicare Other

## 2020-09-01 ENCOUNTER — Inpatient Hospital Stay: Payer: Medicare Other | Attending: Hematology and Oncology

## 2020-09-01 ENCOUNTER — Inpatient Hospital Stay (HOSPITAL_BASED_OUTPATIENT_CLINIC_OR_DEPARTMENT_OTHER): Payer: Medicare Other | Admitting: Hematology and Oncology

## 2020-09-01 ENCOUNTER — Other Ambulatory Visit: Payer: Self-pay

## 2020-09-01 DIAGNOSIS — N1831 Chronic kidney disease, stage 3a: Secondary | ICD-10-CM

## 2020-09-01 DIAGNOSIS — Z85038 Personal history of other malignant neoplasm of large intestine: Secondary | ICD-10-CM | POA: Diagnosis not present

## 2020-09-01 DIAGNOSIS — I10 Essential (primary) hypertension: Secondary | ICD-10-CM

## 2020-09-01 DIAGNOSIS — D631 Anemia in chronic kidney disease: Secondary | ICD-10-CM | POA: Diagnosis not present

## 2020-09-01 DIAGNOSIS — D539 Nutritional anemia, unspecified: Secondary | ICD-10-CM

## 2020-09-01 DIAGNOSIS — N183 Chronic kidney disease, stage 3 unspecified: Secondary | ICD-10-CM | POA: Diagnosis not present

## 2020-09-01 DIAGNOSIS — I129 Hypertensive chronic kidney disease with stage 1 through stage 4 chronic kidney disease, or unspecified chronic kidney disease: Secondary | ICD-10-CM | POA: Insufficient documentation

## 2020-09-01 LAB — CBC WITH DIFFERENTIAL/PLATELET
Abs Immature Granulocytes: 0.04 10*3/uL (ref 0.00–0.07)
Basophils Absolute: 0.1 10*3/uL (ref 0.0–0.1)
Basophils Relative: 1 %
Eosinophils Absolute: 0.2 10*3/uL (ref 0.0–0.5)
Eosinophils Relative: 2 %
HCT: 31.3 % — ABNORMAL LOW (ref 36.0–46.0)
Hemoglobin: 10.2 g/dL — ABNORMAL LOW (ref 12.0–15.0)
Immature Granulocytes: 0 %
Lymphocytes Relative: 24 %
Lymphs Abs: 2.2 10*3/uL (ref 0.7–4.0)
MCH: 28.2 pg (ref 26.0–34.0)
MCHC: 32.6 g/dL (ref 30.0–36.0)
MCV: 86.5 fL (ref 80.0–100.0)
Monocytes Absolute: 0.6 10*3/uL (ref 0.1–1.0)
Monocytes Relative: 7 %
Neutro Abs: 5.9 10*3/uL (ref 1.7–7.7)
Neutrophils Relative %: 66 %
Platelets: 217 10*3/uL (ref 150–400)
RBC: 3.62 MIL/uL — ABNORMAL LOW (ref 3.87–5.11)
RDW: 14 % (ref 11.5–15.5)
WBC: 8.9 10*3/uL (ref 4.0–10.5)
nRBC: 0 % (ref 0.0–0.2)

## 2020-09-01 LAB — COMPREHENSIVE METABOLIC PANEL
ALT: 10 U/L (ref 0–44)
AST: 20 U/L (ref 15–41)
Albumin: 3.6 g/dL (ref 3.5–5.0)
Alkaline Phosphatase: 79 U/L (ref 38–126)
Anion gap: 9 (ref 5–15)
BUN: 29 mg/dL — ABNORMAL HIGH (ref 8–23)
CO2: 26 mmol/L (ref 22–32)
Calcium: 9.9 mg/dL (ref 8.9–10.3)
Chloride: 104 mmol/L (ref 98–111)
Creatinine, Ser: 1.46 mg/dL — ABNORMAL HIGH (ref 0.44–1.00)
GFR, Estimated: 38 mL/min — ABNORMAL LOW (ref >60)
Glucose, Bld: 216 mg/dL — ABNORMAL HIGH (ref 70–99)
Potassium: 3.8 mmol/L (ref 3.5–5.1)
Sodium: 139 mmol/L (ref 135–145)
Total Bilirubin: 0.6 mg/dL (ref 0.3–1.2)
Total Protein: 7.6 g/dL (ref 6.5–8.1)

## 2020-09-01 MED ORDER — EPOETIN ALFA-EPBX 10000 UNIT/ML IJ SOLN
INTRAMUSCULAR | Status: AC
  Filled 2020-09-01: qty 2

## 2020-09-01 MED ORDER — EPOETIN ALFA-EPBX 10000 UNIT/ML IJ SOLN
20000.0000 [IU] | Freq: Once | INTRAMUSCULAR | Status: AC
Start: 2020-09-01 — End: 2020-09-01
  Administered 2020-09-01: 20000 [IU] via SUBCUTANEOUS

## 2020-09-01 NOTE — Patient Instructions (Signed)

## 2020-09-02 ENCOUNTER — Other Ambulatory Visit: Payer: Self-pay | Admitting: Family Medicine

## 2020-09-02 ENCOUNTER — Encounter: Payer: Self-pay | Admitting: Hematology and Oncology

## 2020-09-02 ENCOUNTER — Ambulatory Visit: Payer: Medicare Other

## 2020-09-02 ENCOUNTER — Other Ambulatory Visit: Payer: Medicare Other

## 2020-09-02 ENCOUNTER — Ambulatory Visit: Payer: Medicare Other | Admitting: Hematology and Oncology

## 2020-09-02 NOTE — Assessment & Plan Note (Signed)
She has intermittent acute on chronic renal failure We discussed the importance of close monitoring and follow-up by nephrologist along with risk factor modifications Renal function is stable so far 

## 2020-09-02 NOTE — Assessment & Plan Note (Signed)
She is a long-term cancer survivor.  It has been 15 years since she was diagnosed Her prior colonoscopy and CT imaging performed recently were within normal limits She does not need further imaging studies in this regard

## 2020-09-02 NOTE — Assessment & Plan Note (Signed)
She has intermittent elevated blood pressure Her blood pressure is elevated today but she is not symptomatic Observe closely for now

## 2020-09-02 NOTE — Assessment & Plan Note (Signed)
She has good response to ESA I will keep the dose at 20,000 units every 4 weeks She will return for blood work and injection every 4 weeks to keep hemoglobin greater than 11 I will see her back in 6 months

## 2020-09-02 NOTE — Progress Notes (Signed)
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 I will keep the dose at 20,000 units every 4 weeks She will return for blood work and injection every 4 weeks to keep hemoglobin greater than 11 I will see her back in 6 months  Essential hypertension She has intermittent elevated blood pressure Her blood pressure is elevated today but she is not symptomatic Observe closely for now  Chronic kidney disease, stage III (moderate) ([place]) She has intermittent acute on chronic renal failure We discussed the importance of close monitoring and follow-up by nephrologist along with risk factor modifications Renal function is stable so far  History of colon cancer She is a long-term cancer survivor.  It has been 15 years since she was diagnosed Her prior colonoscopy and CT imaging performed recently were within normal limits She does not need further imaging studies in this regard   Orders Placed This Encounter  Procedures  . Iron and TIBC    Standing Status:   Future    Standing Expiration Date:   09/02/2021  . Vitamin B12    Standing Status:   Future    Standing Expiration Date:   09/02/2021  . Ferritin    Standing Status:   Future    Standing Expiration Date:   09/02/2021  . Erythropoietin    Standing Status:   Future    Standing Expiration Date:   09/02/2021  . Folate RBC    Standing Status:   Future    Standing Expiration Date:   09/02/2021    The total time spent in the appointment was 20 minutes encounter with patients including review of chart and various tests results, discussions about plan of care and coordination of care plan   All questions were answered. The patient knows to call the clinic with any problems, questions or concerns. No barriers to learning was detected.    Heath Lark, MD 5/25/202211:27 AM  INTERVAL HISTORY: Anne Curet. [name] 75 y.o. 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  SUMMARY OF HEMATOLOGIC HISTORY: The patient was seen by multiple different physicians in the past She was originally seen by Dr. Ralene Ok, and then Dr. Juliann Mule, and last seen here in 2016 by Dr. Benay Spice She has remote history of colon cancer diagnosed in 2006, stage II (T3 N0), status post a right colectomy 03/07/2005. She received adjuvant FOLFOX for 4 cycles followed by 8 cycles of 5-FU/leucovorin.  She had imaging study with CT scan of the abdomen and pelvis in 2019 which show no evidence of recurrence.  Her last colonoscopy was on July 14, 2016 which show no evidence of disease.  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  No other abnormalities were noted She also have history of meningioma and follow with neurosurgery for periodic imaging study.  Her last MRI of the brain was in Aug 11, 2015 which show stable meningioma  She was found to have abnormal CBC from recent blood work.  On review of her blood work, it is noted that she has progressive anemia over the last few years, low was around 8.8 In July  of this year, she had iron studies which showed normal iron storage.  She is noted to have progressive renal disease. Starting around 2018, she had progressive renal dysfunction, with serum creatinine fluctuate usually under 2 but in January of this year, her creatinine went up to as high as 2.2. Estimated EGFR put her at chronic kidney disease stage III.  She has multiple cardiovascular risk factors including poorly controlled diabetes, peripheral vascular disease, chronic kidney disease, coronary artery disease and hypertension  She denies recent  chest pain on exertion, shortness of breath on minimal exertion, pre-syncopal episodes, or palpitations. She had not noticed any recent bleeding such as epistaxis, hematuria or hematochezia The patient denies over the counter NSAID ingestion. She is on antiplatelets agents.  She denies any pica and eats a variety of diet. She had received blood transfusion when she was diagnosed with colon cancer Her granddaughter noticed recurrent falls.  She denies generalized weakness.  She denies peripheral neuropathy from prior treatment or diabetes She is started on Epoetin injection for anemia chronic kidney disease on February 10, 2020   I have reviewed the past medical history, past surgical history, social history and family history with the patient and they are unchanged from previous [name].  ALLERGIES:  is allergic to metformin and related.  MEDICATIONS:  Current Outpatient Medications  Medication Sig Dispense Refill  . acetaminophen (TYLENOL) 500 MG tablet Take 1,000 mg by mouth every 6 (six) hours as needed (for pain.).    Marland Kitchen amLODipine (NORVASC) 5 MG tablet TAKE 1 TABLET(5 MG) BY MOUTH DAILY 90 tablet 3  . aspirin 81 MG chewable tablet Chew 81 mg by mouth daily.    . Blood Glucose Monitoring Suppl (ONETOUCH VERIO) w/Device KIT 1 each by Does not apply route daily. 1 kit 0  . clopidogrel (PLAVIX) 75 MG tablet TAKE 1 TABLET(75 MG) BY MOUTH DAILY 90 tablet 2  . famotidine (PEPCID) 20 MG tablet TAKE 1 TABLET(20 MG) BY MOUTH TWICE DAILY AS NEEDED FOR HEARTBURN OR INDIGESTION 90 tablet 2  . glucose blood (ONETOUCH VERIO) test strip Use as instructed 100 each 12  . insulin glargine (LANTUS SOLOSTAR) 100 UNIT/ML Solostar Pen Inject 15 Units into the skin daily at 10 pm. 15 mL 1  . Insulin Pen Needle 31G X 5 MM MISC Check blood sugar once daily 100 each 2  . isosorbide mononitrate (IMDUR) 60 MG 24 hr tablet TAKE 1 TABLET(60 MG) BY MOUTH DAILY (Patient taking differently: Take 60 mg by mouth daily.) 90  tablet 3  . Lancet Devices (ONE TOUCH DELICA LANCING DEV) MISC Check blood sugar once daily 1 each 0  . nitroGLYCERIN (NITROSTAT) 0.4 MG SL tablet PLACE 1 TABLET UNDER THE TONGUE EVERY 5 MINUTES AS NEEDED. IF CHEST PAIN NOT RELIEVED BY THIRD PILL CALL 911 (Patient taking differently: Place 0.4 mg under the tongue every 5 (five) minutes x 3 doses as needed for chest pain.) 25 tablet 7  . quinapril-hydrochlorothiazide (ACCURETIC) 20-12.5 MG tablet TAKE 1 TABLET BY MOUTH DAILY 90 tablet 3  . rosuvastatin (CRESTOR) 20 MG tablet Take 1 tablet (20 mg total) by mouth daily. 90 tablet 3   No current facility-administered medications for this visit.     REVIEW OF SYSTEMS:   Constitutional: Denies fevers, chills or night sweats Eyes: Denies blurriness of vision Ears, nose, mouth, throat, and face: Denies mucositis or sore throat Respiratory: Denies cough, dyspnea or wheezes Cardiovascular: Denies palpitation, chest discomfort or lower extremity swelling Gastrointestinal:  Denies nausea,  heartburn or change in bowel habits Skin: Denies abnormal skin rashes Lymphatics: Denies new lymphadenopathy or easy bruising Neurological:Denies numbness, tingling or new weaknesses Behavioral/Psych: Mood is stable, no new changes  All other systems were reviewed with the patient and are negative.  PHYSICAL EXAMINATION: ECOG PERFORMANCE STATUS: 0 - Asymptomatic  Vitals:   09/01/20 1245  BP: (!) 142/49  Pulse: (!) 52  Resp: 18  Temp: (!) 97 F (36.1 C)  SpO2: 100%   Filed Weights   09/01/20 1245  Weight: 144 lb 6.4 oz (65.5 kg)    GENERAL:alert, no distress and comfortable  NEURO: alert & oriented x 3 with fluent speech, no focal motor/sensory deficits  LABORATORY DATA:  I have reviewed the data as listed     Component Value Date/Time   NA 139 09/01/2020 1226   NA 140 04/09/2020 1024   NA 143 05/15/2014 1154   K 3.8 09/01/2020 1226   K 3.6 05/15/2014 1154   CL 104 09/01/2020 1226   CL 101  05/11/2012 1544   CO2 26 09/01/2020 1226   CO2 24 05/15/2014 1154   GLUCOSE 216 (H) 09/01/2020 1226   GLUCOSE 143 (H) 05/15/2014 1154   GLUCOSE 272 (H) 05/11/2012 1544   BUN 29 (H) 09/01/2020 1226   BUN 38 (H) 04/09/2020 1024   BUN 17.3 05/15/2014 1154   CREATININE 1.46 (H) 09/01/2020 1226   CREATININE 1.06 (H) 07/20/2015 1641   CREATININE 1.0 05/15/2014 1154   CALCIUM 9.9 09/01/2020 1226   CALCIUM 9.6 05/15/2014 1154   PROT 7.6 09/01/2020 1226   PROT 7.0 05/15/2014 1154   ALBUMIN 3.6 09/01/2020 1226   ALBUMIN 3.7 05/15/2014 1154   AST 20 09/01/2020 1226   AST 13 05/15/2014 1154   ALT 10 09/01/2020 1226   ALT 9 05/15/2014 1154   ALKPHOS 79 09/01/2020 1226   ALKPHOS 94 05/15/2014 1154   BILITOT 0.6 09/01/2020 1226   BILITOT 0.68 05/15/2014 1154   GFRNONAA 38 (L) 09/01/2020 1226   GFRNONAA 54 (L) 07/20/2015 1641   GFRAA 36 (L) 04/09/2020 1024   GFRAA 62 07/20/2015 1641    No results found for: SPEP, UPEP  Lab Results  Component Value Date   WBC 8.9 09/01/2020   NEUTROABS 5.9 09/01/2020   HGB 10.2 (L) 09/01/2020   HCT 31.3 (L) 09/01/2020   MCV 86.5 09/01/2020   PLT 217 09/01/2020      Chemistry      Component Value Date/Time   NA 139 09/01/2020 1226   NA 140 04/09/2020 1024   NA 143 05/15/2014 1154   K 3.8 09/01/2020 1226   K 3.6 05/15/2014 1154   CL 104 09/01/2020 1226   CL 101 05/11/2012 1544   CO2 26 09/01/2020 1226   CO2 24 05/15/2014 1154   BUN 29 (H) 09/01/2020 1226   BUN 38 (H) 04/09/2020 1024   BUN 17.3 05/15/2014 1154   CREATININE 1.46 (H) 09/01/2020 1226   CREATININE 1.06 (H) 07/20/2015 1641   CREATININE 1.0 05/15/2014 1154      Component Value Date/Time   CALCIUM 9.9 09/01/2020 1226   CALCIUM 9.6 05/15/2014 1154   ALKPHOS 79 09/01/2020 1226   ALKPHOS 94 05/15/2014 1154   AST 20 09/01/2020 1226   AST 13 05/15/2014 1154   ALT 10 09/01/2020 1226   ALT 9 05/15/2014 1154   BILITOT 0.6 09/01/2020 1226   BILITOT 0.68 05/15/2014 1154

## 2020-09-08 ENCOUNTER — Other Ambulatory Visit: Payer: Self-pay

## 2020-09-08 ENCOUNTER — Encounter: Payer: Self-pay | Admitting: Hematology and Oncology

## 2020-09-08 DIAGNOSIS — E1165 Type 2 diabetes mellitus with hyperglycemia: Secondary | ICD-10-CM

## 2020-09-08 MED ORDER — LANTUS SOLOSTAR 100 UNIT/ML ~~LOC~~ SOPN: 15.0000 [IU] | PEN_INJECTOR | Freq: Every day | SUBCUTANEOUS | 1 refills | Status: DC

## 2020-09-16 ENCOUNTER — Ambulatory Visit: Payer: Medicare Other | Admitting: Cardiovascular Disease

## 2020-09-30 ENCOUNTER — Encounter: Payer: Self-pay | Admitting: Family Medicine

## 2020-09-30 ENCOUNTER — Inpatient Hospital Stay: Payer: Medicare Other | Attending: Hematology and Oncology

## 2020-09-30 ENCOUNTER — Telehealth: Payer: Self-pay

## 2020-09-30 ENCOUNTER — Inpatient Hospital Stay: Payer: Medicare Other

## 2020-09-30 ENCOUNTER — Other Ambulatory Visit: Payer: Self-pay

## 2020-09-30 VITALS — BP 133/50 | HR 58 | Temp 98.2°F | Resp 16

## 2020-09-30 DIAGNOSIS — D631 Anemia in chronic kidney disease: Secondary | ICD-10-CM | POA: Diagnosis not present

## 2020-09-30 DIAGNOSIS — N1831 Chronic kidney disease, stage 3a: Secondary | ICD-10-CM | POA: Diagnosis not present

## 2020-09-30 DIAGNOSIS — D539 Nutritional anemia, unspecified: Secondary | ICD-10-CM

## 2020-09-30 DIAGNOSIS — N183 Chronic kidney disease, stage 3 unspecified: Secondary | ICD-10-CM

## 2020-09-30 LAB — CBC WITH DIFFERENTIAL/PLATELET
Abs Immature Granulocytes: 0.03 10*3/uL (ref 0.00–0.07)
Basophils Absolute: 0.1 10*3/uL (ref 0.0–0.1)
Basophils Relative: 1 %
Eosinophils Absolute: 0.1 10*3/uL (ref 0.0–0.5)
Eosinophils Relative: 1 %
HCT: 31.5 % — ABNORMAL LOW (ref 36.0–46.0)
Hemoglobin: 10.4 g/dL — ABNORMAL LOW (ref 12.0–15.0)
Immature Granulocytes: 0 %
Lymphocytes Relative: 23 %
Lymphs Abs: 2 10*3/uL (ref 0.7–4.0)
MCH: 27.9 pg (ref 26.0–34.0)
MCHC: 33 g/dL (ref 30.0–36.0)
MCV: 84.5 fL (ref 80.0–100.0)
Monocytes Absolute: 0.5 10*3/uL (ref 0.1–1.0)
Monocytes Relative: 6 %
Neutro Abs: 5.9 10*3/uL (ref 1.7–7.7)
Neutrophils Relative %: 69 %
Platelets: 213 10*3/uL (ref 150–400)
RBC: 3.73 MIL/uL — ABNORMAL LOW (ref 3.87–5.11)
RDW: 13.1 % (ref 11.5–15.5)
WBC: 8.5 10*3/uL (ref 4.0–10.5)
nRBC: 0 % (ref 0.0–0.2)

## 2020-09-30 LAB — COMPREHENSIVE METABOLIC PANEL
ALT: 15 U/L (ref 0–44)
AST: 18 U/L (ref 15–41)
Albumin: 3.4 g/dL — ABNORMAL LOW (ref 3.5–5.0)
Alkaline Phosphatase: 81 U/L (ref 38–126)
Anion gap: 11 (ref 5–15)
BUN: 34 mg/dL — ABNORMAL HIGH (ref 8–23)
CO2: 24 mmol/L (ref 22–32)
Calcium: 9.3 mg/dL (ref 8.9–10.3)
Chloride: 103 mmol/L (ref 98–111)
Creatinine, Ser: 1.68 mg/dL — ABNORMAL HIGH (ref 0.44–1.00)
GFR, Estimated: 32 mL/min — ABNORMAL LOW (ref >60)
Glucose, Bld: 350 mg/dL — ABNORMAL HIGH (ref 70–99)
Potassium: 4.1 mmol/L (ref 3.5–5.1)
Sodium: 138 mmol/L (ref 135–145)
Total Bilirubin: 0.6 mg/dL (ref 0.3–1.2)
Total Protein: 7 g/dL (ref 6.5–8.1)

## 2020-09-30 LAB — VITAMIN B12: Vitamin B-12: 357 pg/mL (ref 180–914)

## 2020-09-30 MED ORDER — EPOETIN ALFA-EPBX 10000 UNIT/ML IJ SOLN
20000.0000 [IU] | Freq: Once | INTRAMUSCULAR | Status: AC
  Administered 2020-09-30: 20000 [IU] via SUBCUTANEOUS

## 2020-09-30 MED ORDER — EPOETIN ALFA-EPBX 10000 UNIT/ML IJ SOLN
INTRAMUSCULAR | Status: AC
  Filled 2020-09-30: qty 2

## 2020-09-30 NOTE — Telephone Encounter (Signed)
This LPN called pt to review 350 blood glucose per Dr Alvy Bimler. Pt states she has been taking medication and monitoring blood glucose. Appears it is trending higher. Pt denies diety changes and states she "feels fine". This LPN advised pt to call PCP to make appt regarding this. Pt verbalized thanks and understanding.

## 2020-09-30 NOTE — Patient Instructions (Signed)
Epoetin Alfa injection What is this medication? EPOETIN ALFA (e POE e tin AL fa) helps your body make more red blood cells. This medicine is used to treat anemia caused by chronic kidney disease, cancer chemotherapy, or HIV-therapy. It may also be used before surgery if you have anemia. This medicine may be used for other purposes; ask your health care provider or pharmacist if you have questions. COMMON BRAND NAME(S): Epogen, Procrit, Retacrit What should I tell my care team before I take this medication? They need to know if you have any of these conditions: cancer heart disease high blood pressure history of blood clots history of stroke low levels of folate, iron, or vitamin B12 in the blood seizures an unusual or allergic reaction to erythropoietin, albumin, benzyl alcohol, hamster proteins, other medicines, foods, dyes, or preservatives pregnant or trying to get pregnant breast-feeding How should I use this medication? This medicine is for injection into a vein or under the skin. It is usually given by a health care professional in a hospital or clinic setting. If you get this medicine at home, you will be taught how to prepare and give this medicine. Use exactly as directed. Take your medicine at regular intervals. Do not take your medicine more often than directed. It is important that you put your used needles and syringes in a special sharps container. Do not put them in a trash can. If you do not have a sharps container, call your pharmacist or healthcare provider to get one. A special MedGuide will be given to you by the pharmacist with each prescription and refill. Be sure to read this information carefully each time. Talk to your pediatrician regarding the use of this medicine in children. While this drug may be prescribed for selected conditions, precautions do apply. Overdosage: If you think you have taken too much of this medicine contact a poison control center or emergency  room at once. NOTE: This medicine is only for you. Do not share this medicine with others. What if I miss a dose? If you miss a dose, take it as soon as you can. If it is almost time for your next dose, take only that dose. Do not take double or extra doses. What may interact with this medication? Interactions have not been studied. This list may not describe all possible interactions. Give your health care provider a list of all the medicines, herbs, non-prescription drugs, or dietary supplements you use. Also tell them if you smoke, drink alcohol, or use illegal drugs. Some items may interact with your medicine. What should I watch for while using this medication? Your condition will be monitored carefully while you are receiving this medicine. You may need blood work done while you are taking this medicine. This medicine may cause a decrease in vitamin B6. You should make sure that you get enough vitamin B6 while you are taking this medicine. Discuss the foods you eat and the vitamins you take with your health care professional. What side effects may I notice from receiving this medication? Side effects that you should report to your doctor or health care professional as soon as possible: allergic reactions like skin rash, itching or hives, swelling of the face, lips, or tongue seizures signs and symptoms of a blood clot such as breathing problems; changes in vision; chest pain; severe, sudden headache; pain, swelling, warmth in the leg; trouble speaking; sudden numbness or weakness of the face, arm or leg signs and symptoms of a stroke like   changes in vision; confusion; trouble speaking or understanding; severe headaches; sudden numbness or weakness of the face, arm or leg; trouble walking; dizziness; loss of balance or coordination Side effects that usually do not require medical attention (report to your doctor or health care professional if they continue or are  bothersome): chills cough dizziness fever headaches joint pain muscle cramps muscle pain nausea, vomiting pain, redness, or irritation at site where injected This list may not describe all possible side effects. Call your doctor for medical advice about side effects. You may report side effects to FDA at 1-800-FDA-1088. Where should I keep my medication? Keep out of the reach of children. Store in a refrigerator between 2 and 8 degrees C (36 and 46 degrees F). Do not freeze or shake. Throw away any unused portion if using a single-dose vial. Multi-dose vials can be kept in the refrigerator for up to 21 days after the initial dose. Throw away unused medicine. NOTE: This sheet is a summary. It may not cover all possible information. If you have questions about this medicine, talk to your doctor, pharmacist, or health care provider.  2022 Elsevier/Gold Standard (2016-11-04 08:35:19)  

## 2020-10-01 ENCOUNTER — Encounter: Payer: Self-pay | Admitting: Family Medicine

## 2020-10-01 ENCOUNTER — Other Ambulatory Visit: Payer: Self-pay | Admitting: Family Medicine

## 2020-10-01 ENCOUNTER — Ambulatory Visit (INDEPENDENT_AMBULATORY_CARE_PROVIDER_SITE_OTHER): Payer: Medicare Other | Admitting: Family Medicine

## 2020-10-01 ENCOUNTER — Ambulatory Visit (INDEPENDENT_AMBULATORY_CARE_PROVIDER_SITE_OTHER): Payer: Medicare Other

## 2020-10-01 VITALS — BP 150/55 | HR 62 | Ht 64.0 in | Wt 140.6 lb

## 2020-10-01 DIAGNOSIS — Z794 Long term (current) use of insulin: Secondary | ICD-10-CM

## 2020-10-01 DIAGNOSIS — E1165 Type 2 diabetes mellitus with hyperglycemia: Secondary | ICD-10-CM

## 2020-10-01 DIAGNOSIS — I208 Other forms of angina pectoris: Secondary | ICD-10-CM | POA: Diagnosis not present

## 2020-10-01 DIAGNOSIS — N1832 Chronic kidney disease, stage 3b: Secondary | ICD-10-CM

## 2020-10-01 DIAGNOSIS — E119 Type 2 diabetes mellitus without complications: Secondary | ICD-10-CM

## 2020-10-01 DIAGNOSIS — I152 Hypertension secondary to endocrine disorders: Secondary | ICD-10-CM | POA: Diagnosis not present

## 2020-10-01 DIAGNOSIS — E1122 Type 2 diabetes mellitus with diabetic chronic kidney disease: Secondary | ICD-10-CM | POA: Diagnosis not present

## 2020-10-01 DIAGNOSIS — K5900 Constipation, unspecified: Secondary | ICD-10-CM

## 2020-10-01 DIAGNOSIS — N189 Chronic kidney disease, unspecified: Secondary | ICD-10-CM | POA: Insufficient documentation

## 2020-10-01 DIAGNOSIS — E1159 Type 2 diabetes mellitus with other circulatory complications: Secondary | ICD-10-CM

## 2020-10-01 DIAGNOSIS — Z23 Encounter for immunization: Secondary | ICD-10-CM

## 2020-10-01 DIAGNOSIS — D649 Anemia, unspecified: Secondary | ICD-10-CM

## 2020-10-01 DIAGNOSIS — D631 Anemia in chronic kidney disease: Secondary | ICD-10-CM

## 2020-10-01 DIAGNOSIS — Z79899 Other long term (current) drug therapy: Secondary | ICD-10-CM | POA: Insufficient documentation

## 2020-10-01 HISTORY — DX: Other long term (current) drug therapy: Z79.899

## 2020-10-01 HISTORY — DX: Other long term (current) drug therapy: D64.9

## 2020-10-01 LAB — POCT GLYCOSYLATED HEMOGLOBIN (HGB A1C): HbA1c, POC (controlled diabetic range): 9.7 % — AB (ref 0.0–7.0)

## 2020-10-01 LAB — FOLATE RBC
Folate, Hemolysate: 449 ng/mL
Folate, RBC: 1365 ng/mL (ref >498)
Hematocrit: 32.9 % — ABNORMAL LOW (ref 34.0–46.6)

## 2020-10-01 LAB — ERYTHROPOIETIN: Erythropoietin: 14.5 m[IU]/mL (ref 2.6–18.5)

## 2020-10-01 LAB — FERRITIN: Ferritin: 63 ng/mL (ref 11–307)

## 2020-10-01 LAB — IRON AND TIBC
Iron: 68 ug/dL (ref 41–142)
Saturation Ratios: 24 % (ref 21–57)
TIBC: 285 ug/dL (ref 236–444)
UIBC: 217 ug/dL (ref 120–384)

## 2020-10-01 MED ORDER — POLYETHYLENE GLYCOL 3350 17 GM/SCOOP PO POWD: 17.0000 g | Freq: Every day | ORAL | 1 refills | Status: AC

## 2020-10-01 MED ORDER — NITROGLYCERIN 0.4 MG SL SUBL: 0.4000 mg | SUBLINGUAL_TABLET | SUBLINGUAL | 11 refills | Status: DC | PRN

## 2020-10-01 MED ORDER — TRULICITY 0.75 MG/0.5ML ~~LOC~~ SOAJ: 0.7500 mg | SUBCUTANEOUS | 0 refills | Status: DC

## 2020-10-01 NOTE — Patient Instructions (Addendum)
Your blood sugar control is not where we would like it to be.  Your A1c was 9.7%, we would like to have it less than 8.0%.  Please start a new medicine to treat your diabetes, it is called Trulicity. You inject the Trulicity once a week using an injection Pen. You are starting at the lowest dose.  Your new family doctor at the family medicine center may want to increase your dose at your next office visit in 4 weeks.    Please call our office if you have questions, concerns, or side effects from Trulicity.  Most common side effect is nausea.    You received your first CoViD booster.  You may have a second booster in 4 months.   Take the Miralax powder, one capful mixed in 8 ounces of water or other fluid, and drink each morning.  This medication should soften you stools.

## 2020-10-01 NOTE — Progress Notes (Signed)
Asked by Dr. McDiarmid to educate patient on use of Trulicity (dulaglutide) 0.75mg  once weekly injection.  Patient educated on purpose, proper use and potential adverse effects of Nausea.  Following instruction patient verbalized understanding and demonstrated appropriate administration with first dose in office.   Medication Samples have been provided to the patient.  Drug name: Trulicity (dulaglutide)  Qty: 2 pens  LOT: [id number]  Exp.Date: 05/14/2022  The patient has been instructed regarding the correct time, dose, and frequency of taking this medication, including desired effects and most common side effects.   Janeann Forehand 10:02 AM 10/01/2020

## 2020-10-02 ENCOUNTER — Encounter: Payer: Self-pay | Admitting: Family Medicine

## 2020-10-02 NOTE — Assessment & Plan Note (Addendum)
Established problem Uncontrolled Hard scyballa  Currently taking Senna without success Recommend start daily polyethylene glycol Continue daily senna

## 2020-10-02 NOTE — Assessment & Plan Note (Signed)
Established problem Uncontrolled Patient advised at Epogen infusion site that capillary blood glucose was 350 and she should see her PCP.  PCP not available today.  Her home CBGs running in 100s per patient Taking her Lantus 15 units daily. She has Chronic Kidney Disease 3b.  Lab Results  Component Value Date   HGBA1C 9.7 (A) 03/47/4259   Start Trulicity 5.63 mg weekly.  A sample box of two trulicity 8.75 mg pens given to patient to start. Pharmacy team instructed patient in correct injection technique.  Patient to return to see PCP in 4 weeks to discuss control and consider increasing towards maximum dose with possible titration down of Lantus. Patient reports intolernance to metformin

## 2020-10-02 NOTE — Assessment & Plan Note (Signed)
Established problem Uncontrolled May need to address at follow up visit with PCP

## 2020-10-02 NOTE — Progress Notes (Signed)
[name] is alone Sources of clinical information for visit is/are patient and past medical records. Nursing assessment for this office visit was reviewed with the patient for accuracy and revision.     Previous Report(s) Reviewed: lab reports, office notes, and request urology Medtronic InterStim bladder control device placement operative report by Dr Bjorn Loser ([facility])   Depression screen [facility] 2/9 10/01/2020  Decreased Interest 0  Down, Depressed, Hopeless 0  PHQ - 2 Score 0  Altered sleeping 0  Tired, decreased energy 0  Change in appetite 0  Feeling bad or failure about yourself  0  Trouble concentrating 0  Moving slowly or fidgety/restless 0  Suicidal thoughts 0  PHQ-9 Score 0  Difficult doing work/chores Not difficult at all  Some recent data might be hidden    Fall Risk  10/01/2020 01/27/2020 11/26/2019 10/30/2019 05/24/2018  Falls in the past year? 0 0 0 0 1  Number falls in past yr: 0 0 0 0 1  Injury with Fall? 0 0 - 0 0  Comment - - - - just knee and leg pain  Risk Factor Category  - - - - -  Risk for fall due to : - - - - -  Follow up - - Falls evaluation completed - -    PHQ9 SCORE ONLY 10/01/2020 01/27/2020 11/26/2019  PHQ-9 Total Score 0 0 0    Adult vaccines due  Topic Date Due   TETANUS/TDAP  08/10/2006    Health Maintenance Due  Topic Date Due   Zoster Vaccines- Shingrix (1 of 2) Never done   TETANUS/TDAP  08/10/2006   FOOT EXAM  08/09/2018   OPHTHALMOLOGY EXAM  08/31/2018      History/P.E. limitations: none  Adult vaccines due  Topic Date Due   TETANUS/TDAP  08/10/2006    Diabetes Health Maintenance Due  Topic Date Due   FOOT EXAM  08/09/2018   OPHTHALMOLOGY EXAM  08/31/2018   HEMOGLOBIN A1C  04/02/2021    Health Maintenance Due  Topic Date Due   Zoster Vaccines- Shingrix (1 of 2) Never done   TETANUS/TDAP  08/10/2006   FOOT EXAM  08/09/2018   OPHTHALMOLOGY EXAM  08/31/2018     Chief Complaint  Patient presents with    Blood Sugar Problem   Chest Pain   Constipation    Visit Problem List with A/P  Type 2 diabetes mellitus with diabetic chronic kidney disease ([place]) Established problem Uncontrolled Patient advised at Epogen infusion site that capillary blood glucose was 350 and she should see her PCP.  PCP not available today.  Her home CBGs running in 100s per patient Taking her Lantus 15 units daily. She has Chronic Kidney Disease 3b.  Lab Results  Component Value Date   HGBA1C 9.7 (A) 85/46/2703   Start Trulicity 5.00 mg weekly.  A sample box of two trulicity 9.38 mg pens given to patient to start. Pharmacy team instructed patient in correct injection technique.  Patient to return to see PCP in 4 weeks to discuss control and consider increasing towards maximum dose with possible titration down of Lantus. Patient reports intolernance to metformin    Hypertension associated with diabetes [facility]) Established problem Uncontrolled May need to address at follow up visit with PCP   Constipation Established problem Uncontrolled Hard scyballa  Currently taking Senna without success Recommend start daily polyethylene glycol Continue daily senna

## 2020-10-27 ENCOUNTER — Other Ambulatory Visit: Payer: Self-pay | Admitting: Family Medicine

## 2020-10-27 DIAGNOSIS — E1165 Type 2 diabetes mellitus with hyperglycemia: Secondary | ICD-10-CM

## 2020-10-28 ENCOUNTER — Inpatient Hospital Stay: Payer: Medicare Other | Attending: Hematology and Oncology

## 2020-10-28 ENCOUNTER — Other Ambulatory Visit: Payer: Self-pay

## 2020-10-28 ENCOUNTER — Inpatient Hospital Stay: Payer: Medicare Other

## 2020-10-28 VITALS — BP 135/58 | HR 60 | Temp 98.4°F | Resp 18

## 2020-10-28 DIAGNOSIS — N1832 Chronic kidney disease, stage 3b: Secondary | ICD-10-CM

## 2020-10-28 DIAGNOSIS — D631 Anemia in chronic kidney disease: Secondary | ICD-10-CM | POA: Insufficient documentation

## 2020-10-28 DIAGNOSIS — N183 Chronic kidney disease, stage 3 unspecified: Secondary | ICD-10-CM | POA: Diagnosis not present

## 2020-10-28 DIAGNOSIS — D539 Nutritional anemia, unspecified: Secondary | ICD-10-CM

## 2020-10-28 LAB — CBC WITH DIFFERENTIAL/PLATELET
Abs Immature Granulocytes: 0.03 10*3/uL (ref 0.00–0.07)
Basophils Absolute: 0 10*3/uL (ref 0.0–0.1)
Basophils Relative: 1 %
Eosinophils Absolute: 0.2 10*3/uL (ref 0.0–0.5)
Eosinophils Relative: 2 %
HCT: 32 % — ABNORMAL LOW (ref 36.0–46.0)
Hemoglobin: 10.2 g/dL — ABNORMAL LOW (ref 12.0–15.0)
Immature Granulocytes: 0 %
Lymphocytes Relative: 21 %
Lymphs Abs: 1.7 10*3/uL (ref 0.7–4.0)
MCH: 28 pg (ref 26.0–34.0)
MCHC: 31.9 g/dL (ref 30.0–36.0)
MCV: 87.9 fL (ref 80.0–100.0)
Monocytes Absolute: 0.4 10*3/uL (ref 0.1–1.0)
Monocytes Relative: 5 %
Neutro Abs: 5.6 10*3/uL (ref 1.7–7.7)
Neutrophils Relative %: 71 %
Platelets: 238 10*3/uL (ref 150–400)
RBC: 3.64 MIL/uL — ABNORMAL LOW (ref 3.87–5.11)
RDW: 13.3 % (ref 11.5–15.5)
WBC: 7.9 10*3/uL (ref 4.0–10.5)
nRBC: 0 % (ref 0.0–0.2)

## 2020-10-28 LAB — CMP (CANCER CENTER ONLY)
ALT: 15 U/L (ref 0–44)
AST: 19 U/L (ref 15–41)
Albumin: 3.9 g/dL (ref 3.5–5.0)
Alkaline Phosphatase: 63 U/L (ref 38–126)
Anion gap: 7 (ref 5–15)
BUN: 39 mg/dL — ABNORMAL HIGH (ref 8–23)
CO2: 25 mmol/L (ref 22–32)
Calcium: 9.1 mg/dL (ref 8.9–10.3)
Chloride: 105 mmol/L (ref 98–111)
Creatinine: 1.48 mg/dL — ABNORMAL HIGH (ref 0.44–1.00)
GFR, Estimated: 37 mL/min — ABNORMAL LOW (ref >60)
Glucose, Bld: 232 mg/dL — ABNORMAL HIGH (ref 70–99)
Potassium: 3.7 mmol/L (ref 3.5–5.1)
Sodium: 137 mmol/L (ref 135–145)
Total Bilirubin: 0.7 mg/dL (ref 0.3–1.2)
Total Protein: 7.3 g/dL (ref 6.5–8.1)

## 2020-10-28 MED ORDER — EPOETIN ALFA-EPBX 10000 UNIT/ML IJ SOLN
20000.0000 [IU] | Freq: Once | INTRAMUSCULAR | Status: AC
Start: 2020-10-28 — End: 2020-10-28
  Administered 2020-10-28: 20000 [IU] via SUBCUTANEOUS

## 2020-10-28 MED ORDER — EPOETIN ALFA-EPBX 10000 UNIT/ML IJ SOLN
INTRAMUSCULAR | Status: AC
  Filled 2020-10-28: qty 2

## 2020-10-28 NOTE — Patient Instructions (Signed)
Epoetin Alfa injection What is this medication? EPOETIN ALFA (e POE e tin AL fa) helps your body make more red blood cells. This medicine is used to treat anemia caused by chronic kidney disease, cancer chemotherapy, or HIV-therapy. It may also be used before surgery if you have anemia. This medicine may be used for other purposes; ask your health care provider or pharmacist if you have questions. COMMON BRAND NAME(S): Epogen, Procrit, Retacrit What should I tell my care team before I take this medication? They need to know if you have any of these conditions: cancer heart disease high blood pressure history of blood clots history of stroke low levels of folate, iron, or vitamin B12 in the blood seizures an unusual or allergic reaction to erythropoietin, albumin, benzyl alcohol, hamster proteins, other medicines, foods, dyes, or preservatives pregnant or trying to get pregnant breast-feeding How should I use this medication? This medicine is for injection into a vein or under the skin. It is usually given by a health care professional in a hospital or clinic setting. If you get this medicine at home, you will be taught how to prepare and give this medicine. Use exactly as directed. Take your medicine at regular intervals. Do not take your medicine more often than directed. It is important that you put your used needles and syringes in a special sharps container. Do not put them in a trash can. If you do not have a sharps container, call your pharmacist or healthcare provider to get one. A special MedGuide will be given to you by the pharmacist with each prescription and refill. Be sure to read this information carefully each time. Talk to your pediatrician regarding the use of this medicine in children. While this drug may be prescribed for selected conditions, precautions do apply. Overdosage: If you think you have taken too much of this medicine contact a poison control center or emergency  room at once. NOTE: This medicine is only for you. Do not share this medicine with others. What if I miss a dose? If you miss a dose, take it as soon as you can. If it is almost time for your next dose, take only that dose. Do not take double or extra doses. What may interact with this medication? Interactions have not been studied. This list may not describe all possible interactions. Give your health care provider a list of all the medicines, herbs, non-prescription drugs, or dietary supplements you use. Also tell them if you smoke, drink alcohol, or use illegal drugs. Some items may interact with your medicine. What should I watch for while using this medication? Your condition will be monitored carefully while you are receiving this medicine. You may need blood work done while you are taking this medicine. This medicine may cause a decrease in vitamin B6. You should make sure that you get enough vitamin B6 while you are taking this medicine. Discuss the foods you eat and the vitamins you take with your health care professional. What side effects may I notice from receiving this medication? Side effects that you should report to your doctor or health care professional as soon as possible: allergic reactions like skin rash, itching or hives, swelling of the face, lips, or tongue seizures signs and symptoms of a blood clot such as breathing problems; changes in vision; chest pain; severe, sudden headache; pain, swelling, warmth in the leg; trouble speaking; sudden numbness or weakness of the face, arm or leg signs and symptoms of a stroke like   changes in vision; confusion; trouble speaking or understanding; severe headaches; sudden numbness or weakness of the face, arm or leg; trouble walking; dizziness; loss of balance or coordination Side effects that usually do not require medical attention (report to your doctor or health care professional if they continue or are  bothersome): chills cough dizziness fever headaches joint pain muscle cramps muscle pain nausea, vomiting pain, redness, or irritation at site where injected This list may not describe all possible side effects. Call your doctor for medical advice about side effects. You may report side effects to FDA at 1-800-FDA-1088. Where should I keep my medication? Keep out of the reach of children. Store in a refrigerator between 2 and 8 degrees C (36 and 46 degrees F). Do not freeze or shake. Throw away any unused portion if using a single-dose vial. Multi-dose vials can be kept in the refrigerator for up to 21 days after the initial dose. Throw away unused medicine. NOTE: This sheet is a summary. It may not cover all possible information. If you have questions about this medicine, talk to your doctor, pharmacist, or health care provider.  2022 Elsevier/Gold Standard (2016-11-04 08:35:19)  

## 2020-10-29 ENCOUNTER — Ambulatory Visit (INDEPENDENT_AMBULATORY_CARE_PROVIDER_SITE_OTHER): Payer: Medicare Other | Admitting: Pharmacist

## 2020-10-29 ENCOUNTER — Other Ambulatory Visit: Payer: Self-pay

## 2020-10-29 DIAGNOSIS — Z794 Long term (current) use of insulin: Secondary | ICD-10-CM | POA: Diagnosis not present

## 2020-10-29 DIAGNOSIS — E1122 Type 2 diabetes mellitus with diabetic chronic kidney disease: Secondary | ICD-10-CM

## 2020-10-29 DIAGNOSIS — E1159 Type 2 diabetes mellitus with other circulatory complications: Secondary | ICD-10-CM

## 2020-10-29 DIAGNOSIS — I152 Hypertension secondary to endocrine disorders: Secondary | ICD-10-CM

## 2020-10-29 DIAGNOSIS — N1832 Chronic kidney disease, stage 3b: Secondary | ICD-10-CM | POA: Diagnosis not present

## 2020-10-29 NOTE — Patient Instructions (Addendum)
[name] it was a pleasure seeing you today.   Please do the following:  Continue your current medications as directed today during your appointment. If you have any questions or if you believe something has occurred because of this change, call me or your doctor to let one of [place] know.  Continue checking blood sugars at home. It's really important that you record these and bring these in to your next doctor's appointment.  Continue making the lifestyle changes we've discussed together during our visit. Diet and exercise play a significant role in improving your blood sugars.  Follow-up with me in three weeks. Bring your meter that you check your blood sugars with to appointment Please call [phone] to schedule eye appointment   Hypoglycemia or low blood sugar:   Low blood sugar can happen quickly and may become an emergency if not treated right away.   While this shouldn't happen often, it can be brought upon if you skip a meal or do not eat enough. Also, if your insulin or other diabetes medications are dosed too high, this can cause your blood sugar to go to low.   Warning signs of low blood sugar include: Feeling shaky or dizzy Feeling weak or tired  Excessive hunger Feeling anxious or upset  Sweating even when you aren't exercising  What to do if I experience low blood sugar? Follow the Rule of 15 Check your blood sugar with your meter. If lower than 70, proceed to step 2.  Treat with 15 grams of fast acting carbs which is found in 3-4 glucose tablets. If none are available you can try hard candy, 1 tablespoon of sugar or honey,4 ounces of fruit juice, or 6 ounces of REGULAR soda.  Re-check your sugar in 15 minutes. If it is still below 70, do what you did in step 2 again. If your blood sugar has come back up, go ahead and eat a snack or small meal made up of complex carbs (ex. Whole grains) and protein at this time to avoid recurrence of low blood sugar.

## 2020-10-29 NOTE — Assessment & Plan Note (Signed)
T2DM is controlled based on patient's reported home blood glucose readings but unable to confirm with glucometer not being present and last A1C above goal. Medication adherence appears optimal. At future visit can consider titrating up Trulicity to 1.5mg  once weekly. Following instruction patient verbalized understanding of treatment plan.    1. Continued basal insulin glargine (Lantus) 15 units one daily.  2. Continued GLP-1 dulaglutide (Trulicity) 0.75mg  once weekly on Sundays  3. Instructed patient to check fasting blood glucose daily and bring glucometer to next appointment 4. Counseled on s/sx of and management of hypoglycemia 5. Next A1C anticipated September 2022.

## 2020-10-29 NOTE — Assessment & Plan Note (Signed)
Hypertension longstanding currently controlled.  Diastolic blood pressure in clinic low but patient reports this is normal for her. No complaints of symptomatic hypotension. If patient complains of hypotension can consider backing down off of antihypertensive medications or placing a patient on an ambulatory blood pressure monitor  1. Continued amlodipine 5mg , isosorbide mononitrate 60mg , quinapril-HCTZ 20-12.5mg 

## 2020-10-29 NOTE — Progress Notes (Signed)
   Subjective:    Patient ID: [name], female    DOB: 1945/06/03, 75 y.o.   MRN: [id number]  HPI Patient is a 75 y.o. female who presents for diabetes management. She is in good spirits and presents without assistance. Patient was referred on 10/05/20 and last seen by provider, Dr. Wendy Poet, on 10/01/20.  Patient cannot recall when diabetes was diagnosed "I forgot but it was a long time ago."  Human resources officer affordability: [facility] Medicare/Medicaid  Current diabetes medications include: Lantus 15 units once daily, Trulicity 0.75mg  once weekly on Sundays Current hypertension medications include: amlodipine 5mg , isosorbide mononitrate 60mg , quinapril-HCTZ 20-12.5mg  Current hyperlipidemia medications include: rosuvastatin 20mg  Patient states that She is taking her medications as prescribed. Patient reports adherence with medications.   Do you feel that your medications are working for you?  yes  Have you been experiencing any side effects to the medications prescribed? no  Do you have any problems obtaining medications due to transportation or finances?  no     Patient denies hypoglycemic events. Patient reports polyuria (increased urination).  Patient denies polyphagia (increased appetite).  Patient denies polydipsia (increased thirst).  Patient denies neuropathy (nerve pain). Patient reports visual changes over past two months Patient reports self foot exams.   2 hour post-meal/random blood sugars: 135-150's  Objective:   Labs:   Physical Exam Neurological:     Mental Status: She is alert and oriented to person, place, and time.    Review of Systems  Gastrointestinal:  Positive for constipation. Negative for abdominal pain, nausea and vomiting.   Lab Results  Component Value Date   HGBA1C 9.7 (A) 10/01/2020   HGBA1C 6.9 (H) 03/24/2020   HGBA1C 6.7 10/30/2019    Vitals:   10/29/20 1114  BP: (!) 122/50    Lipid Panel     Component Value Date/Time    CHOL 94 (L) 10/30/2019 1401   TRIG 88 10/30/2019 1401   HDL 56 10/30/2019 1401   CHOLHDL 1.7 10/30/2019 1401   CHOLHDL 3.8 07/20/2015 1641   VLDL 74 (H) 07/20/2015 1641   LDLCALC 21 10/30/2019 1401    Assessment/Plan:   T2DM is controlled based on patient's reported home blood glucose readings but unable to confirm with glucometer not being present and last A1C above goal. Medication adherence appears optimal. At future visit can consider titrating up Trulicity to 1.5mg  once weekly. Following instruction patient verbalized understanding of treatment plan.    Continued basal insulin glargine (Lantus) 15 units one daily.  Continued GLP-1 dulaglutide (Trulicity) 0.75mg  once weekly on Sundays  Instructed patient to check fasting blood glucose daily and bring glucometer to next appointment Counseled on s/sx of and management of hypoglycemia Next A1C anticipated September 2022.   Hypertension longstanding currently controlled.  Diastolic blood pressure in clinic low but patient reports this is normal for her. No complaints of symptomatic hypotension. If patient complains of hypotension can consider backing down off of antihypertensive medications or placing a patient on an ambulatory blood pressure monitor  Continued amlodipine 5mg , isosorbide mononitrate 60mg , quinapril-HCTZ 20-12.5mg   Follow-up appointment 3 weeks to review sugar readings. Written patient instructions provided.  This appointment required 30 minutes of direct patient care.  Thank you for involving pharmacy to assist in providing this patient's care.

## 2020-10-30 ENCOUNTER — Encounter: Payer: Self-pay | Admitting: Family Medicine

## 2020-11-16 ENCOUNTER — Ambulatory Visit: Payer: Medicare Other | Admitting: Pharmacist

## 2020-11-20 ENCOUNTER — Other Ambulatory Visit: Payer: Self-pay

## 2020-11-20 ENCOUNTER — Ambulatory Visit (INDEPENDENT_AMBULATORY_CARE_PROVIDER_SITE_OTHER): Payer: Medicare Other | Admitting: Physician Assistant

## 2020-11-20 VITALS — BP 110/58 | HR 62 | Ht 64.0 in | Wt 138.8 lb

## 2020-11-20 DIAGNOSIS — Z794 Long term (current) use of insulin: Secondary | ICD-10-CM | POA: Diagnosis not present

## 2020-11-20 DIAGNOSIS — E119 Type 2 diabetes mellitus without complications: Secondary | ICD-10-CM

## 2020-11-20 DIAGNOSIS — E785 Hyperlipidemia, unspecified: Secondary | ICD-10-CM | POA: Diagnosis not present

## 2020-11-20 DIAGNOSIS — I2510 Atherosclerotic heart disease of native coronary artery without angina pectoris: Secondary | ICD-10-CM

## 2020-11-20 DIAGNOSIS — I10 Essential (primary) hypertension: Secondary | ICD-10-CM

## 2020-11-20 DIAGNOSIS — E1169 Type 2 diabetes mellitus with other specified complication: Secondary | ICD-10-CM

## 2020-11-20 DIAGNOSIS — I739 Peripheral vascular disease, unspecified: Secondary | ICD-10-CM | POA: Diagnosis not present

## 2020-11-20 DIAGNOSIS — Z9861 Coronary angioplasty status: Secondary | ICD-10-CM

## 2020-11-20 DIAGNOSIS — Z79899 Other long term (current) drug therapy: Secondary | ICD-10-CM

## 2020-11-20 NOTE — Progress Notes (Signed)
Cardiology Office Note:    Date:  11/22/2020   ID:  [name], [name] 21, 1947, MRN [id number]  PCP:  [name], DO   [place] Providers Cardiologist:  Sinclair Grooms, MD     Referring MD: [name], MD   Chief Complaint  Patient presents with   Follow-up    Seen for Dr. Tamala Julian    History of Present Illness:    [name] is a 75 y.o. female with a hx of CAD, HTN, insulin-dependent diabetes, chronic iron deficiency anemia and hyperlipidemia.  In January 2020, she underwent cardiac catheterization that revealed moderate coronary artery disease and was treated medically.  Patient had PCI of RCA in October 2020 with moderate residual disease in left circumflex, OM and distal LAD.  Patient was referred to Dr. [name] in November for evaluation of claudication.  The lower extremity Doppler revealed significant left lower extremity arterial disease.  In December 2021, she underwent PV angiography with intervention to her left SFA above popliteal artery.  Patient was last seen by [name] in December 2021 for follow-up.  Her lower extremity claudication pain has significantly improved.  Patient presents today for follow-up.  She has stable angina on exam.  This has been going on for years.  She says she has some chest discomfort with more strenuous activity but not with every day activity.  She is aware to seek medical attention if there is any increase in frequency or duration of her symptoms.  Otherwise, she denies any significant lower extremity claudication symptoms.  She is overdue for fasting lipid panel and a liver function test.  She will also need a lower extremity arterial duplex.  I recommend she follow-up with Dr. Tamala Julian in 4 to 5 months and Dr. [name] in 6 months.  Past Medical History:  Diagnosis Date   Allergy    Anemia    Asthma    Blood transfusion without reported diagnosis 12/23/2004   with colon cancer   Brain tumor (benign) ([place])    Colon cancer  ([place]) Nov. 27,  2006   COVID-19 04/2019   DEPRESSIVE DISORDER, NOS 06/08/2006   Qualifier: Diagnosis of  By: [name]     Diabetes mellitus    Type 2   Dysphagia 11/17/2014   ENDOSCOPIC IMPRESSION: 1) Single distal esophageal erison - superficial and 5-7 mm. Biopsied 2) S/P fundoplication - GE junction dilated to 20 mm (18-19-20 mm balloon) 3) Otherwise normal except some tortuosity of esophagus RECOMMENDATIONS: 1. Clear liquids until 1100, then soft foods rest of day. Resume prior diet tomorrow. 2. Office will call with results 3. If persistent dysphagia anticipate Ba   Erythropoietin (EPO) stimulating agent anemia management patient 10/01/2020   EXTERNAL HEMORRHOIDS 10/23/2001   Qualifier: Diagnosis of  By: Nolon Rod CMA (AAMA), Robin     Female bladder prolapse    Gastritis    GERD (gastroesophageal reflux disease)    Glaucoma    Hip pain 10/30/2019   History of colon cancer 06/08/2006   Status post resection (right colectomy) in 2006. Tumor stage was T3 N0 IIA. Received 4 cycles of FOLFOX and 8 cycles of chemotherapy with 5 fluorouracil leucovorin and 5FU from 04/27/2005 through 10/13/2005 Colonoscopy 06/2011 no polyps (suspected polyp was not true polyp) - routine repeat colonoscopy about 06/2016   Hypercholesterolemia    Hypertension    Itching 01/09/2012   For the past 3 weeks she has noticed full body itching.  She has not noticed any  specific areas for the itching is worse.  She has tried to apply triamcinolone and petroleum jelly to reduce the itching.  This does not seem to be effective so far.  She denies right upper quadrant pain and denies any history of illicit drug use.  She has not had issues in the past with bedbugs nor does she have any    Malignant neoplasm of colon ([place]) 06/28/2019   MENINGIOMA 02/07/2007   Qualifier: Diagnosis of  By: [name], HEIDI     Pain in both feet 12/14/2017   Patient reports that she had has had some foot pain bilaterally for the past 7 years.  She  reports that it seems to have gotten worse in the past 2 months.  This pain is not burning.  She reports good sensation in both feet.  She sometimes has trouble walking due to the pain.  It is described as though she has a wound on her foot.  She has not had recent trauma to her feet and denies open wounds o   Unstable angina ([place]) 01/15/2019    Past Surgical History:  Procedure Laterality Date   ABDOMINAL AORTOGRAM W/LOWER EXTREMITY Left 03/23/2020   Procedure: ABDOMINAL AORTOGRAM W/LOWER EXTREMITY;  Surgeon: Lorretta Harp, MD;  Location: [place] [facility];  Service: Cardiovascular;  Laterality: Left;   ABDOMINAL HYSTERECTOMY  1976   BALLOON DILATION N/A 08/23/2017   Procedure: BALLOON DILATION;  Surgeon: Milus Banister, MD;  Location: [facility];  Service: Endoscopy;  Laterality: N/A;  Esophageal Diltation   bladder tac     CARDIAC CATHETERIZATION  2005   CATARACT EXTRACTION, BILATERAL     COLONOSCOPY     COLONOSCOPY W/ BIOPSIES     CORONARY STENT INTERVENTION N/A 01/17/2019   Procedure: CORONARY STENT INTERVENTION;  Surgeon: Nelva Bush, MD;  Location: [place] [facility];  Service: Cardiovascular;  Laterality: N/A;   EMBOLIZATION Left 03/23/2020   Procedure: EMBOLIZATION;  Surgeon: Lorretta Harp, MD;  Location: [place] [facility];  Service: Cardiovascular;  Laterality: Left;  SFA AND DCB   ESOPHAGEAL MANOMETRY N/A 03/20/2015   Procedure: ESOPHAGEAL MANOMETRY (EM);  Surgeon: Gatha Mayer, MD;  Location: [facility];  Service: Endoscopy;  Laterality: N/A;   ESOPHAGOGASTRODUODENOSCOPY (EGD) WITH PROPOFOL N/A 08/23/2017   Procedure: ESOPHAGOGASTRODUODENOSCOPY (EGD) WITH PROPOFOL;  Surgeon: Milus Banister, MD;  [facility]: [facility];  Service: Endoscopy;  Laterality: N/A;   EYE SURGERY     mole removed left eye   INTERSTIM IMPLANT PLACEMENT  08/25/2016   [employer]onic Bladder control device: Operator: Bjorn Loser, MD ([place])   INTRAVASCULAR PRESSURE WIRE/FFR STUDY N/A  01/17/2019   Procedure: INTRAVASCULAR PRESSURE WIRE/FFR STUDY;  Surgeon: Nelva Bush, MD;  Location: [place] [facility];  Service: Cardiovascular;  Laterality: N/A;   LAPAROSCOPIC NISSEN FUNDOPLICATION  3295   LEFT HEART CATH AND CORONARY ANGIOGRAPHY N/A 04/17/2018   Procedure: LEFT HEART CATH AND CORONARY ANGIOGRAPHY;  Surgeon: Belva Crome, MD;  Location: [place] [facility];  Service: Cardiovascular;  Laterality: N/A;   LEFT HEART CATH AND CORONARY ANGIOGRAPHY N/A 01/17/2019   Procedure: LEFT HEART CATH AND CORONARY ANGIOGRAPHY;  Surgeon: Nelva Bush, MD;  Location: [place] [facility];  Service: Cardiovascular;  Laterality: N/A;   repair prolapsedbladder     RIGHT COLECTOMY  03/07/2005   TMJ ARTHROPLASTY     UPPER GASTROINTESTINAL ENDOSCOPY      Current Medications: Current Meds  Medication Sig   acetaminophen (TYLENOL) 500 MG  tablet Take 1,000 mg by mouth every 6 (six) hours as needed (for pain.).   amLODipine (NORVASC) 5 MG tablet TAKE 1 TABLET(5 MG) BY MOUTH DAILY   aspirin 81 MG chewable tablet Chew 81 mg by mouth daily.   Blood Glucose Monitoring Suppl (ONETOUCH VERIO) w/Device KIT 1 each by Does not apply route daily.   clopidogrel (PLAVIX) 75 MG tablet TAKE 1 TABLET(75 MG) BY MOUTH DAILY   famotidine (PEPCID) 20 MG tablet TAKE 1 TABLET(20 MG) BY MOUTH TWICE DAILY AS NEEDED FOR HEARTBURN OR INDIGESTION   glucose blood (ONETOUCH VERIO) test strip Use as instructed   insulin glargine (LANTUS SOLOSTAR) 100 UNIT/ML Solostar Pen Inject 15 Units into the skin daily at 10 pm.   Insulin Pen Needle 31G X 5 MM MISC Check blood sugar once daily   isosorbide mononitrate (IMDUR) 60 MG 24 hr tablet TAKE 1 TABLET(60 MG) BY MOUTH DAILY (Patient taking differently: Take 60 mg by mouth daily.)   Lancet Devices (ONE TOUCH DELICA LANCING DEV) MISC Check blood sugar once daily   nitroGLYCERIN (NITROSTAT) 0.4 MG SL tablet Place 1 tablet (0.4 mg total) under the tongue every 5 (five) minutes  as needed for chest pain.   polyethylene glycol powder (GLYCOLAX/MIRALAX) 17 GM/SCOOP powder Take 17 g by mouth daily.   quinapril-hydrochlorothiazide (ACCURETIC) 20-12.5 MG tablet TAKE 1 TABLET BY MOUTH DAILY   rosuvastatin (CRESTOR) 20 MG tablet Take 1 tablet (20 mg total) by mouth daily.   TRULICITY 5.88 TG/5.4DI SOPN ADMINISTER 0.75 MG UNDER THE SKIN 1 TIME A WEEK     Allergies:   Metformin and related   Social History   Socioeconomic History   Marital status: Legally Separated    Spouse name: Not on file   Number of children: 2   Years of education: Not on file   Highest education level: Not on file  Occupational History   Not on file  Tobacco Use   Smoking status: Never   Smokeless tobacco: Never  Vaping Use   Vaping Use: Never used  Substance and Sexual Activity   Alcohol use: No   Drug use: No   Sexual activity: Not on file  Other Topics Concern   Not on file  Social History Narrative   Not on file   Social Determinants of Health   Financial Resource Strain: Not on file  Food Insecurity: Not on file  Transportation Needs: Not on file  Physical Activity: Not on file  Stress: Not on file  Social Connections: Not on file     Family History: The patient's family history includes Bone cancer in her father; Diabetes in her daughter; Heart attack in her daughter; Hypertension in her daughter; Lymphoma in an other family member; Suicidality in her son. There is no history of Colon cancer or Stomach cancer.  ROS:   Please see the history of present illness.     All other systems reviewed and are negative.  EKGs/Labs/Other Studies Reviewed:    The following studies were reviewed today:  Echo 01/17/2019   1. Left ventricular ejection fraction, by visual estimation, is 60 to  65%. The left ventricle has normal function. Normal left ventricular size.  There is moderately increased left ventricular hypertrophy. The left  ventricular hypertrophy involves   basal-septum walls.   2. Global right ventricle has normal systolic function.The right  ventricular size is normal. No increase in right ventricular wall  thickness.   3. Left atrial size was normal.   4.  Right atrial size was normal.   5. The mitral valve is normal in structure. No evidence of mitral valve  regurgitation. No evidence of mitral stenosis.   6. The tricuspid valve is normal in structure. Tricuspid valve  regurgitation is mild.   7. The aortic valve is normal in structure. Aortic valve regurgitation  was not visualized by color flow Doppler. Structurally normal aortic  valve, with no evidence of sclerosis or stenosis.   8. The pulmonic valve was normal in structure. Pulmonic valve  regurgitation is not visualized by color flow Doppler.   9. Normal pulmonary artery systolic pressure.  10. The inferior vena cava is normal in size with greater than 50%  respiratory variability, suggesting right atrial pressure of 3 mmHg.  11. Elevated left ventricular end-diastolic pressure.  12. Left ventricular diastolic Doppler parameters are indeterminate  pattern of LV diastolic filling.   EKG:  EKG is ordered today.  The ekg ordered today demonstrates normal sinus rhythm, no significant ST-T wave changes  Recent Labs: 10/28/2020: ALT 15; BUN 39; Creatinine 1.48; Hemoglobin 10.2; Platelets 238; Potassium 3.7; Sodium 137  Recent Lipid Panel    Component Value Date/Time   CHOL 94 (L) 10/30/2019 1401   TRIG 88 10/30/2019 1401   HDL 56 10/30/2019 1401   CHOLHDL 1.7 10/30/2019 1401   CHOLHDL 3.8 07/20/2015 1641   VLDL 74 (H) 07/20/2015 1641   LDLCALC 21 10/30/2019 1401     Risk Assessment/Calculations:           Physical Exam:    VS:  BP (!) 110/58   Pulse 62   Ht 5' 4" (1.626 m)   Wt 138 lb 12.8 oz (63 kg)   SpO2 98%   BMI 23.82 kg/m     Wt Readings from Last 3 Encounters:  11/20/20 138 lb 12.8 oz (63 kg)  10/29/20 137 lb 6.4 oz (62.3 kg)  10/01/20 140 lb 9.6 oz  (63.8 kg)     GEN:  Well nourished, well developed in no acute distress HEENT: Normal NECK: No JVD; No carotid bruits LYMPHATICS: No lymphadenopathy CARDIAC: RRR, no murmurs, rubs, gallops RESPIRATORY:  Clear to auscultation without rales, wheezing or rhonchi  ABDOMEN: Soft, non-tender, non-distended MUSCULOSKELETAL:  No edema; No deformity  SKIN: Warm and dry NEUROLOGIC:  Alert and oriented x 3 PSYCHIATRIC:  Normal affect   ASSESSMENT:    1. Claudication in peripheral vascular disease ([place])   2. CAD S/P percutaneous coronary angioplasty   3. Hyperlipidemia associated with type 2 diabetes mellitus ([place])   4. Medication management   5. Essential hypertension   6. Controlled type 2 diabetes mellitus without complication, with long-term current use of insulin (HCC)    PLAN:    In order of problems listed above:  PAD: Claudication symptoms improved after last lower extremity angiography.  Will need repeat ABI and LEA  CAD: Last cardiac catheterization in October 2020.  She has mild chest discomfort with very strenuous activity but not with every day activity.  Appears to have stable angina that is unchanged in the past year.  Recommend follow-up with Dr. Tamala Julian in 4 months.  She is aware to contact cardiology service if there is any increase in frequency, duration or increased chest discomfort with minimal exertion.  Hyperlipidemia: Continue Crestor  Hypertension: Continue current therapy  DM2: Managed per primary care provider         Medication Adjustments/Labs and Tests Ordered: Current medicines are reviewed at length with the patient today.  Concerns regarding medicines are outlined above.  Orders Placed This Encounter  Procedures   Lipid panel   Hepatic function panel   EKG 12-Lead   VAS [place] LOWER EXTREMITY ARTERIAL DUPLEX   VAS [place] ABI WITH/WO TBI   No orders of the defined types were placed in this encounter.   Patient Instructions  Medication Instructions:   Continue current medications  *If you need a refill on your cardiac medications before your next appointment, please call your pharmacy*   Lab Work: Fasting Lipid and Liver in 1 Month  If you have labs (blood work) drawn today and your tests are completely normal, you will receive your results only by: [place] (if you have MyChart) OR A paper copy in the mail If you have any lab test that is abnormal or we need to change your treatment, we will call you to review the results.   Testing/Procedures: Your physician has requested that you have a lower extremity arterial duplex in 1 Month. This test is an ultrasound of the arteries in the legs or arms. It looks at arterial blood flow in the legs and arms. Allow one hour for Lower and Upper Arterial scans. There are no restrictions or special instructions  Your physician has requested that you have an ankle brachial index (ABI) in 1 Month. During this test an ultrasound and blood pressure cuff are used to evaluate the arteries that supply the arms and legs with blood. Allow thirty minutes for this exam. There are no restrictions or special instructions.  Follow-Up: At [facility] you and your health needs are our priority.  As part of our continuing mission to provide you with exceptional heart care, we have created designated Provider Care Teams.  These Care Teams include your primary Cardiologist (physician) and Advanced Practice Providers (APPs -  Physician Assistants and Nurse Practitioners) who all work together to provide you with the care you need, when you need it.  We recommend signing up for the patient portal called "MyChart".  Sign up information is provided on this After Visit Summary.  MyChart is used to connect with patients for Virtual Visits (Telemedicine).  Patients are able to view lab/test results, encounter notes, upcoming appointments, etc.  Non-urgent messages can be sent to your provider as well.   To learn more  about what you can do with MyChart, go to [email or web address].    Your next appointment:   4-5 month(s)  The format for your next appointment:   In Person  Provider:   You [name] see Sinclair Grooms, MD or one of the following Advanced Practice Providers on your designated Care Team:   Cecilie Kicks, NP    Other Instructions Your physician recommends that you schedule a follow-up appointment in: 6 Month with Dr [name] for PAD    Signed, Almyra Deforest, [place]  11/22/2020 11:51 PM    [place]

## 2020-11-20 NOTE — Patient Instructions (Signed)
Medication Instructions:  Continue current medications  *If you need a refill on your cardiac medications before your next appointment, please call your pharmacy*   Lab Work: Fasting Lipid and Liver in 1 Month  If you have labs (blood work) drawn today and your tests are completely normal, you will receive your results only by: [place] (if you have MyChart) OR A paper copy in the mail If you have any lab test that is abnormal or we need to change your treatment, we will call you to review the results.   Testing/Procedures: Your physician has requested that you have a lower extremity arterial duplex in 1 Month. This test is an ultrasound of the arteries in the legs or arms. It looks at arterial blood flow in the legs and arms. Allow one hour for Lower and Upper Arterial scans. There are no restrictions or special instructions  Your physician has requested that you have an ankle brachial index (ABI) in 1 Month. During this test an ultrasound and blood pressure cuff are used to evaluate the arteries that supply the arms and legs with blood. Allow thirty minutes for this exam. There are no restrictions or special instructions.  Follow-Up: At [facility], you and your health needs are our priority.  As part of our continuing mission to provide you with exceptional heart care, we have created designated Provider Care Teams.  These Care Teams include your primary Cardiologist (physician) and Advanced Practice Providers (APPs -  Physician Assistants and Nurse Practitioners) who all work together to provide you with the care you need, when you need it.  We recommend signing up for the patient portal called "MyChart".  Sign up information is provided on this After Visit Summary.  MyChart is used to connect with patients for Virtual Visits (Telemedicine).  Patients are able to view lab/test results, encounter notes, upcoming appointments, etc.  Non-urgent messages can be sent to your provider  as well.   To learn more about what you can do with MyChart, go to [email or web address].    Your next appointment:   4-5 month(s)  The format for your next appointment:   In Person  Provider:   You may see Sinclair Grooms, MD or one of the following Advanced Practice Providers on your designated Care Team:   Cecilie Kicks, NP    Other Instructions Your physician recommends that you schedule a follow-up appointment in: 6 Month with Dr [name] for PAD

## 2020-11-22 ENCOUNTER — Encounter: Payer: Self-pay | Admitting: Physician Assistant

## 2020-11-23 ENCOUNTER — Other Ambulatory Visit: Payer: Self-pay | Admitting: Family Medicine

## 2020-11-23 DIAGNOSIS — E1165 Type 2 diabetes mellitus with hyperglycemia: Secondary | ICD-10-CM

## 2020-11-25 ENCOUNTER — Other Ambulatory Visit: Payer: Self-pay

## 2020-11-25 ENCOUNTER — Inpatient Hospital Stay: Payer: Medicare Other

## 2020-11-25 ENCOUNTER — Inpatient Hospital Stay: Payer: Medicare Other | Attending: Hematology and Oncology

## 2020-11-25 VITALS — BP 134/49 | HR 60 | Temp 98.4°F | Resp 16

## 2020-11-25 DIAGNOSIS — D539 Nutritional anemia, unspecified: Secondary | ICD-10-CM

## 2020-11-25 DIAGNOSIS — N183 Chronic kidney disease, stage 3 unspecified: Secondary | ICD-10-CM | POA: Diagnosis not present

## 2020-11-25 DIAGNOSIS — N1832 Chronic kidney disease, stage 3b: Secondary | ICD-10-CM

## 2020-11-25 DIAGNOSIS — D631 Anemia in chronic kidney disease: Secondary | ICD-10-CM | POA: Diagnosis not present

## 2020-11-25 LAB — CBC WITH DIFFERENTIAL/PLATELET
Abs Immature Granulocytes: 0.02 10*3/uL (ref 0.00–0.07)
Basophils Absolute: 0 10*3/uL (ref 0.0–0.1)
Basophils Relative: 1 %
Eosinophils Absolute: 0.1 10*3/uL (ref 0.0–0.5)
Eosinophils Relative: 1 %
HCT: 29.6 % — ABNORMAL LOW (ref 36.0–46.0)
Hemoglobin: 9.7 g/dL — ABNORMAL LOW (ref 12.0–15.0)
Immature Granulocytes: 0 %
Lymphocytes Relative: 23 %
Lymphs Abs: 1.8 10*3/uL (ref 0.7–4.0)
MCH: 28 pg (ref 26.0–34.0)
MCHC: 32.8 g/dL (ref 30.0–36.0)
MCV: 85.5 fL (ref 80.0–100.0)
Monocytes Absolute: 0.5 10*3/uL (ref 0.1–1.0)
Monocytes Relative: 6 %
Neutro Abs: 5.2 10*3/uL (ref 1.7–7.7)
Neutrophils Relative %: 69 %
Platelets: 206 10*3/uL (ref 150–400)
RBC: 3.46 MIL/uL — ABNORMAL LOW (ref 3.87–5.11)
RDW: 13.5 % (ref 11.5–15.5)
WBC: 7.6 10*3/uL (ref 4.0–10.5)
nRBC: 0 % (ref 0.0–0.2)

## 2020-11-25 LAB — COMPREHENSIVE METABOLIC PANEL
ALT: 22 U/L (ref 0–44)
AST: 23 U/L (ref 15–41)
Albumin: 3.6 g/dL (ref 3.5–5.0)
Alkaline Phosphatase: 75 U/L (ref 38–126)
Anion gap: 11 (ref 5–15)
BUN: 41 mg/dL — ABNORMAL HIGH (ref 8–23)
CO2: 25 mmol/L (ref 22–32)
Calcium: 9.4 mg/dL (ref 8.9–10.3)
Chloride: 105 mmol/L (ref 98–111)
Creatinine, Ser: 1.94 mg/dL — ABNORMAL HIGH (ref 0.44–1.00)
GFR, Estimated: 27 mL/min — ABNORMAL LOW (ref >60)
Glucose, Bld: 232 mg/dL — ABNORMAL HIGH (ref 70–99)
Potassium: 3.6 mmol/L (ref 3.5–5.1)
Sodium: 141 mmol/L (ref 135–145)
Total Bilirubin: 0.8 mg/dL (ref 0.3–1.2)
Total Protein: 7.1 g/dL (ref 6.5–8.1)

## 2020-11-25 MED ORDER — EPOETIN ALFA-EPBX 20000 UNIT/ML IJ SOLN
20000.0000 [IU] | Freq: Once | INTRAMUSCULAR | Status: AC
  Administered 2020-11-25: 20000 [IU] via SUBCUTANEOUS
  Filled 2020-11-25: qty 1

## 2020-11-25 MED ORDER — EPOETIN ALFA-EPBX 10000 UNIT/ML IJ SOLN: 20000.0000 [IU] | Freq: Once | INTRAMUSCULAR | Status: DC

## 2020-11-25 NOTE — Patient Instructions (Signed)
Epoetin Alfa injection What is this medication? EPOETIN ALFA (e POE e tin AL fa) helps your body make more red blood cells. This medicine is used to treat anemia caused by chronic kidney disease, cancer chemotherapy, or HIV-therapy. It may also be used before surgery if you have anemia. This medicine may be used for other purposes; ask your health care provider or pharmacist if you have questions. COMMON BRAND NAME(S): Epogen, Procrit, Retacrit What should I tell my care team before I take this medication? They need to know if you have any of these conditions: cancer heart disease high blood pressure history of blood clots history of stroke low levels of folate, iron, or vitamin B12 in the blood seizures an unusual or allergic reaction to erythropoietin, albumin, benzyl alcohol, hamster proteins, other medicines, foods, dyes, or preservatives pregnant or trying to get pregnant breast-feeding How should I use this medication? This medicine is for injection into a vein or under the skin. It is usually given by a health care professional in a hospital or clinic setting. If you get this medicine at home, you will be taught how to prepare and give this medicine. Use exactly as directed. Take your medicine at regular intervals. Do not take your medicine more often than directed. It is important that you put your used needles and syringes in a special sharps container. Do not put them in a trash can. If you do not have a sharps container, call your pharmacist or healthcare provider to get one. A special MedGuide will be given to you by the pharmacist with each prescription and refill. Be sure to read this information carefully each time. Talk to your pediatrician regarding the use of this medicine in children. While this drug may be prescribed for selected conditions, precautions do apply. Overdosage: If you think you have taken too much of this medicine contact a poison control center or emergency  room at once. NOTE: This medicine is only for you. Do not share this medicine with others. What if I miss a dose? If you miss a dose, take it as soon as you can. If it is almost time for your next dose, take only that dose. Do not take double or extra doses. What may interact with this medication? Interactions have not been studied. This list may not describe all possible interactions. Give your health care provider a list of all the medicines, herbs, non-prescription drugs, or dietary supplements you use. Also tell them if you smoke, drink alcohol, or use illegal drugs. Some items may interact with your medicine. What should I watch for while using this medication? Your condition will be monitored carefully while you are receiving this medicine. You may need blood work done while you are taking this medicine. This medicine may cause a decrease in vitamin B6. You should make sure that you get enough vitamin B6 while you are taking this medicine. Discuss the foods you eat and the vitamins you take with your health care professional. What side effects may I notice from receiving this medication? Side effects that you should report to your doctor or health care professional as soon as possible: allergic reactions like skin rash, itching or hives, swelling of the face, lips, or tongue seizures signs and symptoms of a blood clot such as breathing problems; changes in vision; chest pain; severe, sudden headache; pain, swelling, warmth in the leg; trouble speaking; sudden numbness or weakness of the face, arm or leg signs and symptoms of a stroke like   changes in vision; confusion; trouble speaking or understanding; severe headaches; sudden numbness or weakness of the face, arm or leg; trouble walking; dizziness; loss of balance or coordination Side effects that usually do not require medical attention (report to your doctor or health care professional if they continue or are  bothersome): chills cough dizziness fever headaches joint pain muscle cramps muscle pain nausea, vomiting pain, redness, or irritation at site where injected This list may not describe all possible side effects. Call your doctor for medical advice about side effects. You may report side effects to FDA at 1-800-FDA-1088. Where should I keep my medication? Keep out of the reach of children. Store in a refrigerator between 2 and 8 degrees C (36 and 46 degrees F). Do not freeze or shake. Throw away any unused portion if using a single-dose vial. Multi-dose vials can be kept in the refrigerator for up to 21 days after the initial dose. Throw away unused medicine. NOTE: This sheet is a summary. It may not cover all possible information. If you have questions about this medicine, talk to your doctor, pharmacist, or health care provider.  2022 Elsevier/Gold Standard (2016-11-04 08:35:19)  

## 2020-11-26 ENCOUNTER — Telehealth: Payer: Self-pay

## 2020-11-26 NOTE — Telephone Encounter (Signed)
Called and given below message. She verbalized understanding. She will call her PCP and schedule appt in 1 month.

## 2020-11-26 NOTE — Telephone Encounter (Signed)
-----   Message from Heath Lark, MD sent at 11/26/2020  9:53 AM EDT ----- Her creatinine is higher than her baseline Advise her to drink more fluids and make sure she has a follow-up with PCP within 1 month to follow

## 2020-11-27 ENCOUNTER — Telehealth: Payer: Self-pay

## 2020-11-27 NOTE — Telephone Encounter (Signed)
Patient calls nurse line stating she went to cancer center and had las drawn. Patient reports she was told her DM medications are "causing harm" to her liver and needs an apt ASAP. PCP has no apts until September. I have scheduled her with Ernst Bowler to discuss.   Please let me know if apt is not needed or she needs to see PCP. Thanks!

## 2020-12-01 ENCOUNTER — Ambulatory Visit: Payer: Medicare Other | Admitting: Pharmacist

## 2020-12-01 MED ORDER — LANCETS MISC: 0 refills | Status: DC

## 2020-12-01 MED ORDER — ONETOUCH VERIO W/DEVICE KIT: 1.0000 | PACK | Freq: Every day | 0 refills | Status: DC

## 2020-12-01 MED ORDER — ONETOUCH VERIO VI STRP: ORAL_STRIP | 0 refills | Status: DC

## 2020-12-08 DIAGNOSIS — E1169 Type 2 diabetes mellitus with other specified complication: Secondary | ICD-10-CM | POA: Diagnosis not present

## 2020-12-08 DIAGNOSIS — Z79899 Other long term (current) drug therapy: Secondary | ICD-10-CM | POA: Diagnosis not present

## 2020-12-08 DIAGNOSIS — E785 Hyperlipidemia, unspecified: Secondary | ICD-10-CM | POA: Diagnosis not present

## 2020-12-08 LAB — LIPID PANEL
Chol/HDL Ratio: 2 ratio (ref 0.0–4.4)
Cholesterol, Total: 83 mg/dL — ABNORMAL LOW (ref 100–199)
HDL: 42 mg/dL (ref >39)
LDL Chol Calc (NIH): 23 mg/dL (ref 0–99)
Triglycerides: 95 mg/dL (ref 0–149)
VLDL Cholesterol Cal: 18 mg/dL (ref 5–40)

## 2020-12-08 LAB — HEPATIC FUNCTION PANEL
ALT: 9 IU/L (ref 0–32)
AST: 17 IU/L (ref 0–40)
Albumin: 4.2 g/dL (ref 3.7–4.7)
Alkaline Phosphatase: 67 IU/L (ref 44–121)
Bilirubin Total: 0.6 mg/dL (ref 0.0–1.2)
Bilirubin, Direct: 0.19 mg/dL (ref 0.00–0.40)
Total Protein: 6.8 g/dL (ref 6.0–8.5)

## 2020-12-16 ENCOUNTER — Other Ambulatory Visit: Payer: Self-pay

## 2020-12-16 ENCOUNTER — Ambulatory Visit (HOSPITAL_COMMUNITY)
Admission: RE | Admit: 2020-12-16 | Discharge: 2020-12-16 | Disposition: A | Discharge status: Home / Self Care | Payer: Medicare Other | Source: Ambulatory Visit | Attending: Internal Medicine | Admitting: Internal Medicine

## 2020-12-16 ENCOUNTER — Other Ambulatory Visit: Payer: Self-pay | Admitting: Physician Assistant

## 2020-12-16 ENCOUNTER — Other Ambulatory Visit (HOSPITAL_COMMUNITY): Payer: Self-pay | Admitting: Cardiovascular Disease

## 2020-12-16 DIAGNOSIS — I739 Peripheral vascular disease, unspecified: Secondary | ICD-10-CM

## 2020-12-16 DIAGNOSIS — Z9862 Peripheral vascular angioplasty status: Secondary | ICD-10-CM

## 2020-12-23 ENCOUNTER — Inpatient Hospital Stay: Payer: Medicare Other

## 2020-12-23 ENCOUNTER — Other Ambulatory Visit: Payer: Self-pay

## 2020-12-23 ENCOUNTER — Inpatient Hospital Stay: Payer: Medicare Other | Attending: Hematology and Oncology

## 2020-12-23 VITALS — BP 131/50 | HR 62 | Temp 98.9°F | Resp 18

## 2020-12-23 DIAGNOSIS — D539 Nutritional anemia, unspecified: Secondary | ICD-10-CM

## 2020-12-23 DIAGNOSIS — I129 Hypertensive chronic kidney disease with stage 1 through stage 4 chronic kidney disease, or unspecified chronic kidney disease: Secondary | ICD-10-CM | POA: Insufficient documentation

## 2020-12-23 DIAGNOSIS — E1122 Type 2 diabetes mellitus with diabetic chronic kidney disease: Secondary | ICD-10-CM | POA: Diagnosis not present

## 2020-12-23 DIAGNOSIS — N1832 Chronic kidney disease, stage 3b: Secondary | ICD-10-CM | POA: Diagnosis not present

## 2020-12-23 DIAGNOSIS — D631 Anemia in chronic kidney disease: Secondary | ICD-10-CM | POA: Diagnosis not present

## 2020-12-23 LAB — COMPREHENSIVE METABOLIC PANEL
ALT: 11 U/L (ref 0–44)
AST: 14 U/L — ABNORMAL LOW (ref 15–41)
Albumin: 3.7 g/dL (ref 3.5–5.0)
Alkaline Phosphatase: 66 U/L (ref 38–126)
Anion gap: 9 (ref 5–15)
BUN: 42 mg/dL — ABNORMAL HIGH (ref 8–23)
CO2: 25 mmol/L (ref 22–32)
Calcium: 9.9 mg/dL (ref 8.9–10.3)
Chloride: 107 mmol/L (ref 98–111)
Creatinine, Ser: 1.63 mg/dL — ABNORMAL HIGH (ref 0.44–1.00)
GFR, Estimated: 33 mL/min — ABNORMAL LOW (ref >60)
Glucose, Bld: 222 mg/dL — ABNORMAL HIGH (ref 70–99)
Potassium: 3.9 mmol/L (ref 3.5–5.1)
Sodium: 141 mmol/L (ref 135–145)
Total Bilirubin: 0.7 mg/dL (ref 0.3–1.2)
Total Protein: 7.3 g/dL (ref 6.5–8.1)

## 2020-12-23 LAB — CBC WITH DIFFERENTIAL/PLATELET
Abs Immature Granulocytes: 0.04 10*3/uL (ref 0.00–0.07)
Basophils Absolute: 0 10*3/uL (ref 0.0–0.1)
Basophils Relative: 0 %
Eosinophils Absolute: 0.2 10*3/uL (ref 0.0–0.5)
Eosinophils Relative: 3 %
HCT: 29.1 % — ABNORMAL LOW (ref 36.0–46.0)
Hemoglobin: 9.5 g/dL — ABNORMAL LOW (ref 12.0–15.0)
Immature Granulocytes: 1 %
Lymphocytes Relative: 27 %
Lymphs Abs: 1.9 10*3/uL (ref 0.7–4.0)
MCH: 27.5 pg (ref 26.0–34.0)
MCHC: 32.6 g/dL (ref 30.0–36.0)
MCV: 84.3 fL (ref 80.0–100.0)
Monocytes Absolute: 0.5 10*3/uL (ref 0.1–1.0)
Monocytes Relative: 7 %
Neutro Abs: 4.5 10*3/uL (ref 1.7–7.7)
Neutrophils Relative %: 62 %
Platelets: 225 10*3/uL (ref 150–400)
RBC: 3.45 MIL/uL — ABNORMAL LOW (ref 3.87–5.11)
RDW: 13.7 % (ref 11.5–15.5)
WBC: 7.2 10*3/uL (ref 4.0–10.5)
nRBC: 0 % (ref 0.0–0.2)

## 2020-12-23 MED ORDER — EPOETIN ALFA-EPBX 10000 UNIT/ML IJ SOLN
20000.0000 [IU] | Freq: Once | INTRAMUSCULAR | Status: AC
  Administered 2020-12-23: 20000 [IU] via SUBCUTANEOUS
  Filled 2020-12-23: qty 2

## 2020-12-23 NOTE — Patient Instructions (Signed)
Epoetin Alfa injection What is this medication? EPOETIN ALFA (e POE e tin AL fa) helps your body make more red blood cells. This medicine is used to treat anemia caused by chronic kidney disease, cancer chemotherapy, or HIV-therapy. It may also be used before surgery if you have anemia. This medicine may be used for other purposes; ask your health care provider or pharmacist if you have questions. COMMON BRAND NAME(S): Epogen, Procrit, Retacrit What should I tell my care team before I take this medication? They need to know if you have any of these conditions: cancer heart disease high blood pressure history of blood clots history of stroke low levels of folate, iron, or vitamin B12 in the blood seizures an unusual or allergic reaction to erythropoietin, albumin, benzyl alcohol, hamster proteins, other medicines, foods, dyes, or preservatives pregnant or trying to get pregnant breast-feeding How should I use this medication? This medicine is for injection into a vein or under the skin. It is usually given by a health care professional in a hospital or clinic setting. If you get this medicine at home, you will be taught how to prepare and give this medicine. Use exactly as directed. Take your medicine at regular intervals. Do not take your medicine more often than directed. It is important that you put your used needles and syringes in a special sharps container. Do not put them in a trash can. If you do not have a sharps container, call your pharmacist or healthcare provider to get one. A special MedGuide will be given to you by the pharmacist with each prescription and refill. Be sure to read this information carefully each time. Talk to your pediatrician regarding the use of this medicine in children. While this drug may be prescribed for selected conditions, precautions do apply. Overdosage: If you think you have taken too much of this medicine contact a poison control center or emergency  room at once. NOTE: This medicine is only for you. Do not share this medicine with others. What if I miss a dose? If you miss a dose, take it as soon as you can. If it is almost time for your next dose, take only that dose. Do not take double or extra doses. What may interact with this medication? Interactions have not been studied. This list may not describe all possible interactions. Give your health care provider a list of all the medicines, herbs, non-prescription drugs, or dietary supplements you use. Also tell them if you smoke, drink alcohol, or use illegal drugs. Some items may interact with your medicine. What should I watch for while using this medication? Your condition will be monitored carefully while you are receiving this medicine. You may need blood work done while you are taking this medicine. This medicine may cause a decrease in vitamin B6. You should make sure that you get enough vitamin B6 while you are taking this medicine. Discuss the foods you eat and the vitamins you take with your health care professional. What side effects may I notice from receiving this medication? Side effects that you should report to your doctor or health care professional as soon as possible: allergic reactions like skin rash, itching or hives, swelling of the face, lips, or tongue seizures signs and symptoms of a blood clot such as breathing problems; changes in vision; chest pain; severe, sudden headache; pain, swelling, warmth in the leg; trouble speaking; sudden numbness or weakness of the face, arm or leg signs and symptoms of a stroke like   changes in vision; confusion; trouble speaking or understanding; severe headaches; sudden numbness or weakness of the face, arm or leg; trouble walking; dizziness; loss of balance or coordination Side effects that usually do not require medical attention (report to your doctor or health care professional if they continue or are  bothersome): chills cough dizziness fever headaches joint pain muscle cramps muscle pain nausea, vomiting pain, redness, or irritation at site where injected This list may not describe all possible side effects. Call your doctor for medical advice about side effects. You may report side effects to FDA at 1-800-FDA-1088. Where should I keep my medication? Keep out of the reach of children. Store in a refrigerator between 2 and 8 degrees C (36 and 46 degrees F). Do not freeze or shake. Throw away any unused portion if using a single-dose vial. Multi-dose vials can be kept in the refrigerator for up to 21 days after the initial dose. Throw away unused medicine. NOTE: This sheet is a summary. It may not cover all possible information. If you have questions about this medicine, talk to your doctor, pharmacist, or health care provider.  2022 Elsevier/Gold Standard (2016-11-04 08:35:19)  

## 2021-01-04 ENCOUNTER — Other Ambulatory Visit: Payer: Self-pay | Admitting: Interventional Cardiology

## 2021-01-20 ENCOUNTER — Inpatient Hospital Stay: Payer: Medicare Other

## 2021-01-20 ENCOUNTER — Inpatient Hospital Stay: Payer: Medicare Other | Attending: Hematology and Oncology

## 2021-01-20 ENCOUNTER — Other Ambulatory Visit: Payer: Self-pay

## 2021-01-20 VITALS — BP 127/52 | HR 58 | Temp 98.7°F | Resp 16

## 2021-01-20 DIAGNOSIS — N1832 Chronic kidney disease, stage 3b: Secondary | ICD-10-CM

## 2021-01-20 DIAGNOSIS — D631 Anemia in chronic kidney disease: Secondary | ICD-10-CM | POA: Diagnosis not present

## 2021-01-20 DIAGNOSIS — D539 Nutritional anemia, unspecified: Secondary | ICD-10-CM

## 2021-01-20 LAB — COMPREHENSIVE METABOLIC PANEL
ALT: 16 U/L (ref 0–44)
AST: 19 U/L (ref 15–41)
Albumin: 3.7 g/dL (ref 3.5–5.0)
Alkaline Phosphatase: 61 U/L (ref 38–126)
Anion gap: 7 (ref 5–15)
BUN: 35 mg/dL — ABNORMAL HIGH (ref 8–23)
CO2: 27 mmol/L (ref 22–32)
Calcium: 10 mg/dL (ref 8.9–10.3)
Chloride: 109 mmol/L (ref 98–111)
Creatinine, Ser: 1.41 mg/dL — ABNORMAL HIGH (ref 0.44–1.00)
GFR, Estimated: 39 mL/min — ABNORMAL LOW (ref >60)
Glucose, Bld: 290 mg/dL — ABNORMAL HIGH (ref 70–99)
Potassium: 4.3 mmol/L (ref 3.5–5.1)
Sodium: 143 mmol/L (ref 135–145)
Total Bilirubin: 0.6 mg/dL (ref 0.3–1.2)
Total Protein: 7.3 g/dL (ref 6.5–8.1)

## 2021-01-20 LAB — CBC WITH DIFFERENTIAL/PLATELET
Abs Immature Granulocytes: 0.02 10*3/uL (ref 0.00–0.07)
Basophils Absolute: 0.1 10*3/uL (ref 0.0–0.1)
Basophils Relative: 1 %
Eosinophils Absolute: 0.3 10*3/uL (ref 0.0–0.5)
Eosinophils Relative: 4 %
HCT: 29.4 % — ABNORMAL LOW (ref 36.0–46.0)
Hemoglobin: 9.4 g/dL — ABNORMAL LOW (ref 12.0–15.0)
Immature Granulocytes: 0 %
Lymphocytes Relative: 30 %
Lymphs Abs: 2.2 10*3/uL (ref 0.7–4.0)
MCH: 27.2 pg (ref 26.0–34.0)
MCHC: 32 g/dL (ref 30.0–36.0)
MCV: 85.2 fL (ref 80.0–100.0)
Monocytes Absolute: 0.5 10*3/uL (ref 0.1–1.0)
Monocytes Relative: 6 %
Neutro Abs: 4.4 10*3/uL (ref 1.7–7.7)
Neutrophils Relative %: 59 %
Platelets: 244 10*3/uL (ref 150–400)
RBC: 3.45 MIL/uL — ABNORMAL LOW (ref 3.87–5.11)
RDW: 14.1 % (ref 11.5–15.5)
WBC: 7.4 10*3/uL (ref 4.0–10.5)
nRBC: 0 % (ref 0.0–0.2)

## 2021-01-20 MED ORDER — EPOETIN ALFA-EPBX 10000 UNIT/ML IJ SOLN
20000.0000 [IU] | Freq: Once | INTRAMUSCULAR | Status: AC
  Administered 2021-01-20: 20000 [IU] via SUBCUTANEOUS
  Filled 2021-01-20: qty 2

## 2021-01-20 NOTE — Patient Instructions (Signed)
Epoetin Alfa injection What is this medication? EPOETIN ALFA (e POE e tin AL fa) helps your body make more red blood cells. This medicine is used to treat anemia caused by chronic kidney disease, cancer chemotherapy, or HIV-therapy. It may also be used before surgery if you have anemia. This medicine may be used for other purposes; ask your health care provider or pharmacist if you have questions. COMMON BRAND NAME(S): Epogen, Procrit, Retacrit What should I tell my care team before I take this medication? They need to know if you have any of these conditions: cancer heart disease high blood pressure history of blood clots history of stroke low levels of folate, iron, or vitamin B12 in the blood seizures an unusual or allergic reaction to erythropoietin, albumin, benzyl alcohol, hamster proteins, other medicines, foods, dyes, or preservatives pregnant or trying to get pregnant breast-feeding How should I use this medication? This medicine is for injection into a vein or under the skin. It is usually given by a health care professional in a hospital or clinic setting. If you get this medicine at home, you will be taught how to prepare and give this medicine. Use exactly as directed. Take your medicine at regular intervals. Do not take your medicine more often than directed. It is important that you put your used needles and syringes in a special sharps container. Do not put them in a trash can. If you do not have a sharps container, call your pharmacist or healthcare provider to get one. A special MedGuide will be given to you by the pharmacist with each prescription and refill. Be sure to read this information carefully each time. Talk to your pediatrician regarding the use of this medicine in children. While this drug may be prescribed for selected conditions, precautions do apply. Overdosage: If you think you have taken too much of this medicine contact a poison control center or emergency  room at once. NOTE: This medicine is only for you. Do not share this medicine with others. What if I miss a dose? If you miss a dose, take it as soon as you can. If it is almost time for your next dose, take only that dose. Do not take double or extra doses. What may interact with this medication? Interactions have not been studied. This list may not describe all possible interactions. Give your health care provider a list of all the medicines, herbs, non-prescription drugs, or dietary supplements you use. Also tell them if you smoke, drink alcohol, or use illegal drugs. Some items may interact with your medicine. What should I watch for while using this medication? Your condition will be monitored carefully while you are receiving this medicine. You may need blood work done while you are taking this medicine. This medicine may cause a decrease in vitamin B6. You should make sure that you get enough vitamin B6 while you are taking this medicine. Discuss the foods you eat and the vitamins you take with your health care professional. What side effects may I notice from receiving this medication? Side effects that you should report to your doctor or health care professional as soon as possible: allergic reactions like skin rash, itching or hives, swelling of the face, lips, or tongue seizures signs and symptoms of a blood clot such as breathing problems; changes in vision; chest pain; severe, sudden headache; pain, swelling, warmth in the leg; trouble speaking; sudden numbness or weakness of the face, arm or leg signs and symptoms of a stroke like   changes in vision; confusion; trouble speaking or understanding; severe headaches; sudden numbness or weakness of the face, arm or leg; trouble walking; dizziness; loss of balance or coordination Side effects that usually do not require medical attention (report to your doctor or health care professional if they continue or are  bothersome): chills cough dizziness fever headaches joint pain muscle cramps muscle pain nausea, vomiting pain, redness, or irritation at site where injected This list may not describe all possible side effects. Call your doctor for medical advice about side effects. You may report side effects to FDA at 1-800-FDA-1088. Where should I keep my medication? Keep out of the reach of children. Store in a refrigerator between 2 and 8 degrees C (36 and 46 degrees F). Do not freeze or shake. Throw away any unused portion if using a single-dose vial. Multi-dose vials can be kept in the refrigerator for up to 21 days after the initial dose. Throw away unused medicine. NOTE: This sheet is a summary. It may not cover all possible information. If you have questions about this medicine, talk to your doctor, pharmacist, or health care provider.  2022 Elsevier/Gold Standard (2016-11-04 08:35:19)  

## 2021-01-26 IMAGING — DX DG CHEST 2V
2 series · 2 of 2 positions shown · non-contrast
Comparison: Chest x-ray 08/19/2017

CLINICAL DATA: Intermittent chest pain since [REDACTED].

EXAM:
CHEST - 2 VIEW

[chest pa]
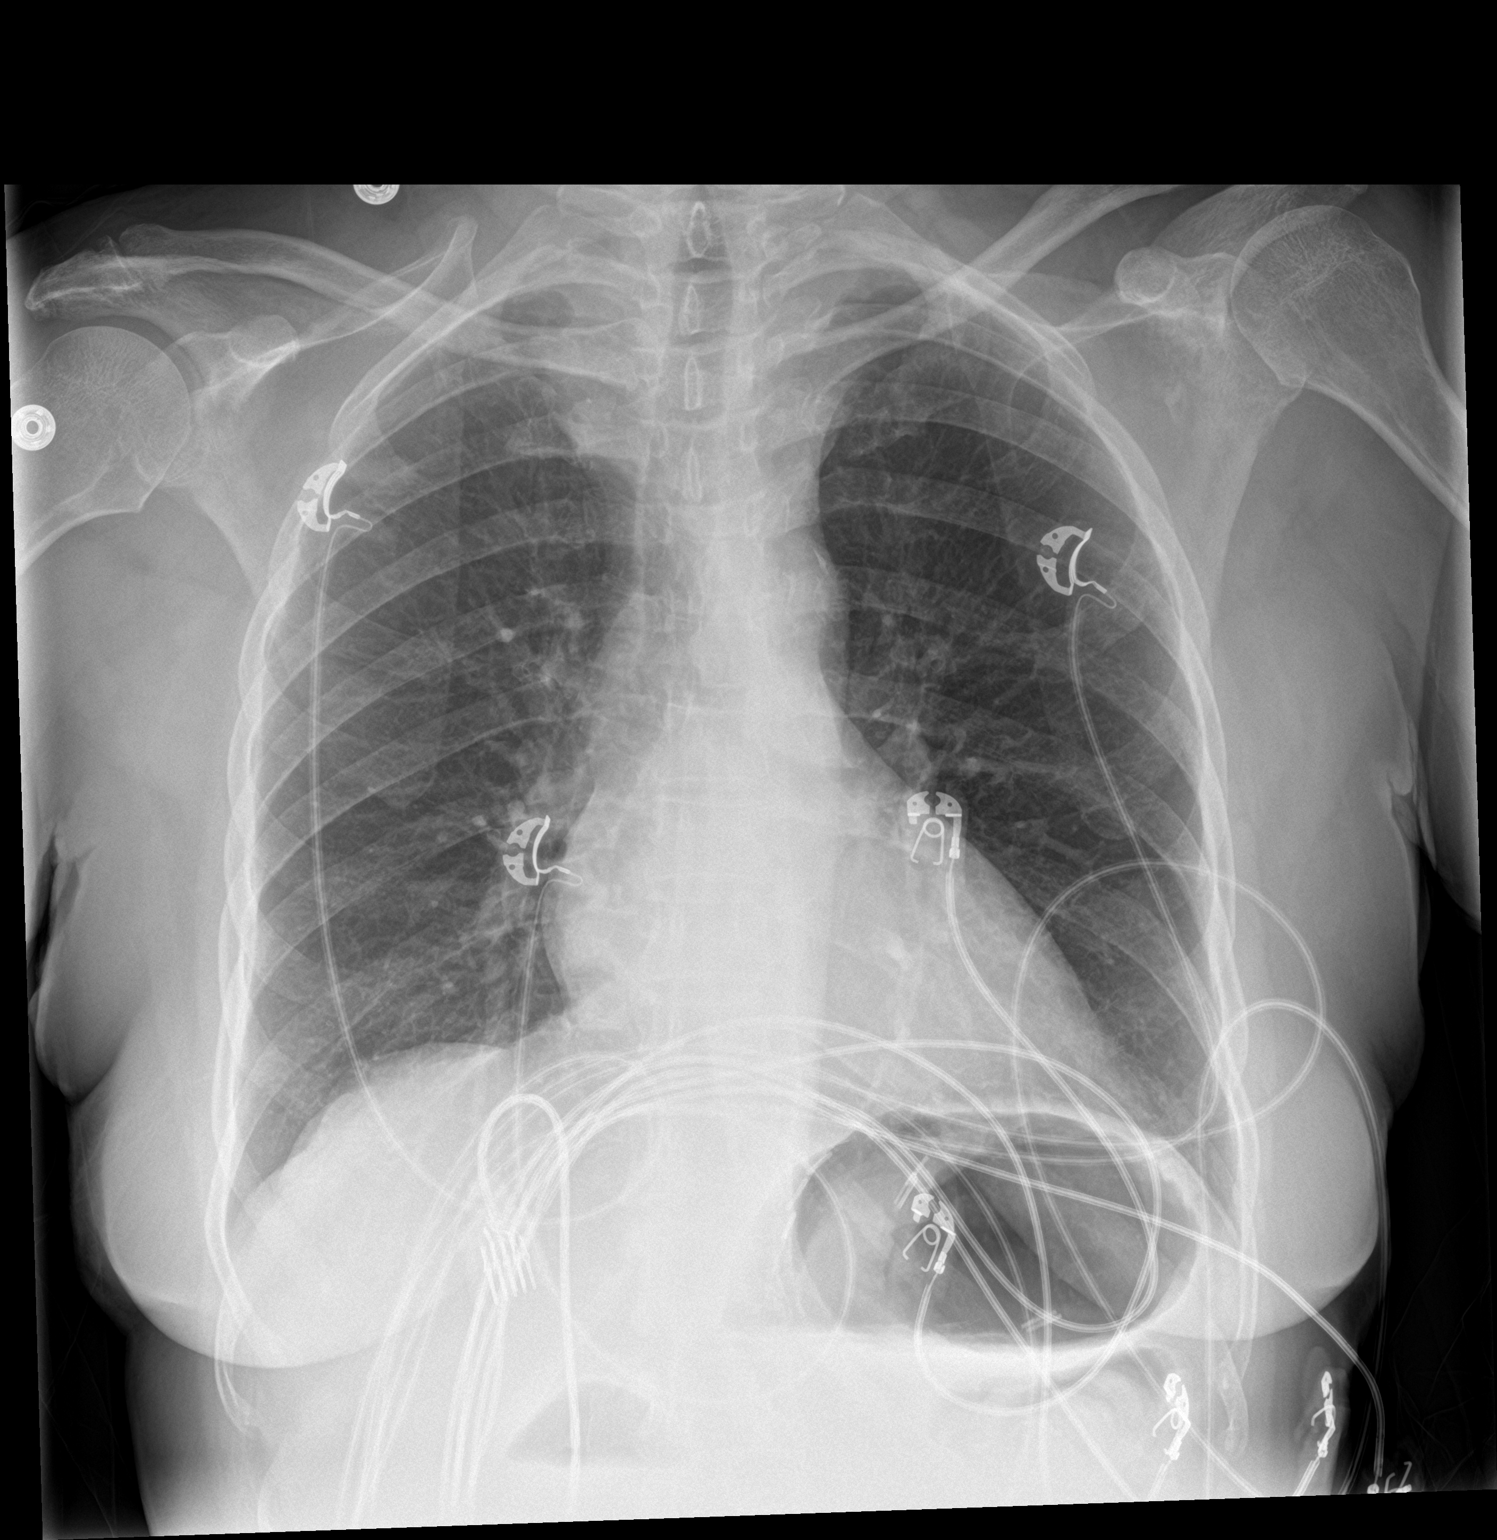

[chest lat]
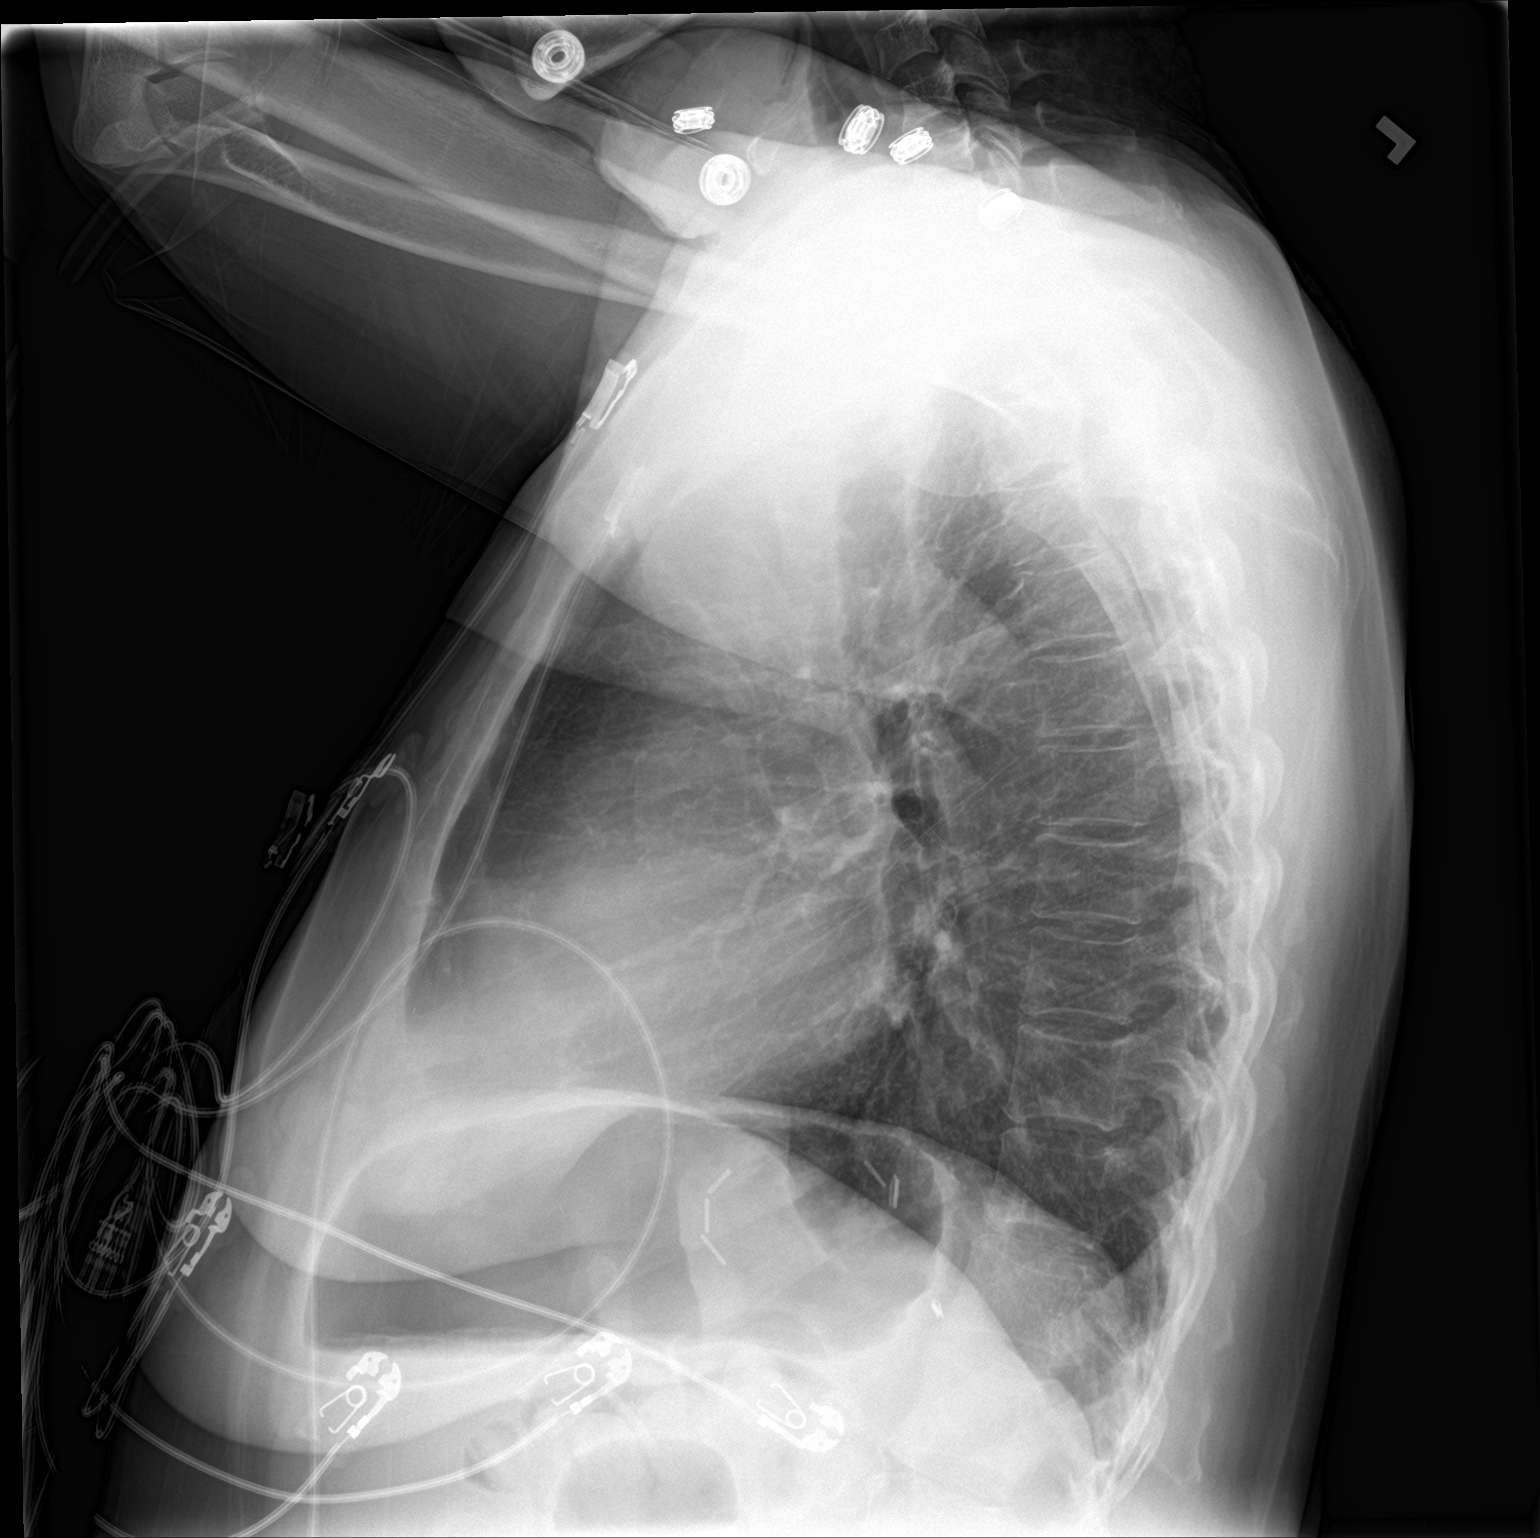

[2 of 2 positions shown; findings below may reference images not displayed]

FINDINGS: The cardiac silhouette, mediastinal and hilar contours are within
normal limits and stable. Mild tortuosity of the thoracic aorta with
scattered calcifications.

Biapical pleural and parenchymal scarring type changes appears
stable when compared to prior chest films. No infiltrates or
effusions. No worrisome pulmonary lesions. The bony thorax is
intact.
IMPRESSION: 1. No acute cardiopulmonary findings.
2. Biapical paratracheal scarring type changes appears stable.

## 2021-02-08 ENCOUNTER — Encounter: Payer: Self-pay | Admitting: Hematology and Oncology

## 2021-02-08 DIAGNOSIS — Z961 Presence of intraocular lens: Secondary | ICD-10-CM | POA: Diagnosis not present

## 2021-02-08 DIAGNOSIS — H35373 Puckering of macula, bilateral: Secondary | ICD-10-CM | POA: Diagnosis not present

## 2021-02-08 DIAGNOSIS — E113593 Type 2 diabetes mellitus with proliferative diabetic retinopathy without macular edema, bilateral: Secondary | ICD-10-CM | POA: Diagnosis not present

## 2021-02-08 DIAGNOSIS — Z794 Long term (current) use of insulin: Secondary | ICD-10-CM | POA: Diagnosis not present

## 2021-02-10 ENCOUNTER — Encounter (INDEPENDENT_AMBULATORY_CARE_PROVIDER_SITE_OTHER): Payer: Medicare Other | Admitting: Ophthalmology

## 2021-02-10 ENCOUNTER — Other Ambulatory Visit: Payer: Self-pay

## 2021-02-10 DIAGNOSIS — H4312 Vitreous hemorrhage, left eye: Secondary | ICD-10-CM

## 2021-02-10 DIAGNOSIS — E113591 Type 2 diabetes mellitus with proliferative diabetic retinopathy without macular edema, right eye: Secondary | ICD-10-CM

## 2021-02-10 DIAGNOSIS — E113512 Type 2 diabetes mellitus with proliferative diabetic retinopathy with macular edema, left eye: Secondary | ICD-10-CM

## 2021-02-10 DIAGNOSIS — H35033 Hypertensive retinopathy, bilateral: Secondary | ICD-10-CM

## 2021-02-10 DIAGNOSIS — I10 Essential (primary) hypertension: Secondary | ICD-10-CM | POA: Diagnosis not present

## 2021-02-12 ENCOUNTER — Other Ambulatory Visit: Payer: Self-pay | Admitting: Interventional Cardiology

## 2021-02-16 DIAGNOSIS — D631 Anemia in chronic kidney disease: Secondary | ICD-10-CM | POA: Diagnosis not present

## 2021-02-16 DIAGNOSIS — H539 Unspecified visual disturbance: Secondary | ICD-10-CM | POA: Diagnosis not present

## 2021-02-16 DIAGNOSIS — Z23 Encounter for immunization: Secondary | ICD-10-CM | POA: Diagnosis not present

## 2021-02-16 DIAGNOSIS — I10 Essential (primary) hypertension: Secondary | ICD-10-CM | POA: Diagnosis not present

## 2021-02-16 DIAGNOSIS — N1832 Chronic kidney disease, stage 3b: Secondary | ICD-10-CM | POA: Diagnosis not present

## 2021-02-16 DIAGNOSIS — E7800 Pure hypercholesterolemia, unspecified: Secondary | ICD-10-CM | POA: Diagnosis not present

## 2021-02-16 DIAGNOSIS — I739 Peripheral vascular disease, unspecified: Secondary | ICD-10-CM | POA: Diagnosis not present

## 2021-02-16 DIAGNOSIS — Z0000 Encounter for general adult medical examination without abnormal findings: Secondary | ICD-10-CM | POA: Diagnosis not present

## 2021-02-16 DIAGNOSIS — E118 Type 2 diabetes mellitus with unspecified complications: Secondary | ICD-10-CM | POA: Diagnosis not present

## 2021-02-17 ENCOUNTER — Ambulatory Visit: Payer: Medicare Other | Admitting: Hematology and Oncology

## 2021-02-17 ENCOUNTER — Other Ambulatory Visit: Payer: Medicare Other

## 2021-02-17 ENCOUNTER — Ambulatory Visit: Payer: Medicare Other

## 2021-02-18 ENCOUNTER — Encounter: Payer: Self-pay | Admitting: Hematology and Oncology

## 2021-02-18 ENCOUNTER — Other Ambulatory Visit: Payer: Self-pay

## 2021-02-18 ENCOUNTER — Inpatient Hospital Stay: Payer: Medicare Other

## 2021-02-18 ENCOUNTER — Inpatient Hospital Stay: Payer: Medicare Other | Attending: Hematology and Oncology

## 2021-02-18 ENCOUNTER — Inpatient Hospital Stay (HOSPITAL_BASED_OUTPATIENT_CLINIC_OR_DEPARTMENT_OTHER): Payer: Medicare Other | Admitting: Hematology and Oncology

## 2021-02-18 DIAGNOSIS — N1832 Chronic kidney disease, stage 3b: Secondary | ICD-10-CM | POA: Diagnosis not present

## 2021-02-18 DIAGNOSIS — E1122 Type 2 diabetes mellitus with diabetic chronic kidney disease: Secondary | ICD-10-CM | POA: Insufficient documentation

## 2021-02-18 DIAGNOSIS — D631 Anemia in chronic kidney disease: Secondary | ICD-10-CM | POA: Diagnosis not present

## 2021-02-18 DIAGNOSIS — Z85038 Personal history of other malignant neoplasm of large intestine: Secondary | ICD-10-CM | POA: Diagnosis not present

## 2021-02-18 DIAGNOSIS — E1159 Type 2 diabetes mellitus with other circulatory complications: Secondary | ICD-10-CM | POA: Diagnosis not present

## 2021-02-18 DIAGNOSIS — I152 Hypertension secondary to endocrine disorders: Secondary | ICD-10-CM

## 2021-02-18 DIAGNOSIS — I129 Hypertensive chronic kidney disease with stage 1 through stage 4 chronic kidney disease, or unspecified chronic kidney disease: Secondary | ICD-10-CM | POA: Insufficient documentation

## 2021-02-18 DIAGNOSIS — N189 Chronic kidney disease, unspecified: Secondary | ICD-10-CM

## 2021-02-18 DIAGNOSIS — D539 Nutritional anemia, unspecified: Secondary | ICD-10-CM

## 2021-02-18 LAB — CBC WITH DIFFERENTIAL/PLATELET
Abs Immature Granulocytes: 0.02 10*3/uL (ref 0.00–0.07)
Basophils Absolute: 0 10*3/uL (ref 0.0–0.1)
Basophils Relative: 1 %
Eosinophils Absolute: 0.2 10*3/uL (ref 0.0–0.5)
Eosinophils Relative: 2 %
HCT: 30.4 % — ABNORMAL LOW (ref 36.0–46.0)
Hemoglobin: 9.5 g/dL — ABNORMAL LOW (ref 12.0–15.0)
Immature Granulocytes: 0 %
Lymphocytes Relative: 30 %
Lymphs Abs: 2 10*3/uL (ref 0.7–4.0)
MCH: 27 pg (ref 26.0–34.0)
MCHC: 31.3 g/dL (ref 30.0–36.0)
MCV: 86.4 fL (ref 80.0–100.0)
Monocytes Absolute: 0.5 10*3/uL (ref 0.1–1.0)
Monocytes Relative: 7 %
Neutro Abs: 3.9 10*3/uL (ref 1.7–7.7)
Neutrophils Relative %: 60 %
Platelets: 209 10*3/uL (ref 150–400)
RBC: 3.52 MIL/uL — ABNORMAL LOW (ref 3.87–5.11)
RDW: 14.4 % (ref 11.5–15.5)
WBC: 6.5 10*3/uL (ref 4.0–10.5)
nRBC: 0 % (ref 0.0–0.2)

## 2021-02-18 LAB — COMPREHENSIVE METABOLIC PANEL
ALT: 11 U/L (ref 0–44)
AST: 17 U/L (ref 15–41)
Albumin: 3.6 g/dL (ref 3.5–5.0)
Alkaline Phosphatase: 63 U/L (ref 38–126)
Anion gap: 8 (ref 5–15)
BUN: 34 mg/dL — ABNORMAL HIGH (ref 8–23)
CO2: 25 mmol/L (ref 22–32)
Calcium: 9.2 mg/dL (ref 8.9–10.3)
Chloride: 109 mmol/L (ref 98–111)
Creatinine, Ser: 1.47 mg/dL — ABNORMAL HIGH (ref 0.44–1.00)
GFR, Estimated: 37 mL/min — ABNORMAL LOW (ref >60)
Glucose, Bld: 179 mg/dL — ABNORMAL HIGH (ref 70–99)
Potassium: 3.6 mmol/L (ref 3.5–5.1)
Sodium: 142 mmol/L (ref 135–145)
Total Bilirubin: 0.7 mg/dL (ref 0.3–1.2)
Total Protein: 7 g/dL (ref 6.5–8.1)

## 2021-02-18 MED ORDER — EPOETIN ALFA-EPBX 10000 UNIT/ML IJ SOLN
40000.0000 [IU] | Freq: Once | INTRAMUSCULAR | Status: AC
  Administered 2021-02-18: 40000 [IU] via SUBCUTANEOUS
  Filled 2021-02-18: qty 4

## 2021-02-18 NOTE — Assessment & Plan Note (Signed)
Her blood pressure is elevated today but in general, her blood pressure is normal She will continue medical management 

## 2021-02-18 NOTE — Progress Notes (Signed)
[facility] OFFICE PROGRESS NOTE  Caren Macadam, MD  ASSESSMENT & PLAN:  Anemia of renal disease She has been getting 20,000 units of erythropoietin stimulating agent for the past 6 months and her hemoglobin barely reach above 10 We discussed the risk and benefits of increasing the dose to 40,000 units but keeping the interval between injection at every 4 weeks and she is in agreement I plan to see her again in 6 months for further follow-up  Hypertension associated with diabetes ([place]) Her blood pressure is elevated today but in general, her blood pressure is normal She will continue medical management  No orders of the defined types were placed in this encounter.   The total time spent in the appointment was 20 minutes encounter with patients including review of chart and various tests results, discussions about plan of care and coordination of care plan   All questions were answered. The patient knows to call the clinic with any problems, questions or concerns. No barriers to learning was detected.    Heath Lark, MD 11/10/20224:36 PM  INTERVAL HISTORY: [name]. Bugh 75 y.o. female returns for further follow-up on erythropoietin stimulating agent She is doing well except her vision has declined due to progression of diabetes She tolerated erythropoietin stimulating agent well The patient has remote history of colon cancer but has not been bother by any kind of GI issues The patient denies any recent signs or symptoms of bleeding such as spontaneous epistaxis, hematuria or hematochezia.   SUMMARY OF HEMATOLOGIC HISTORY: The patient was seen by multiple different physicians in the past She was originally seen by Dr. Ralene Ok, and then Dr. Juliann Mule, and last seen here in 2016 by Dr. Benay Spice She has remote history of colon cancer diagnosed in 2006, stage II (T3 N0), status post a right colectomy 03/07/2005. She received adjuvant FOLFOX for 4 cycles followed by 8  cycles of 5-FU/leucovorin.  She had imaging study with CT scan of the abdomen and pelvis in 2019 which show no evidence of recurrence.  Her last colonoscopy was on July 14, 2016 which show no evidence of disease.  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  No other abnormalities were noted She also have history of meningioma and follow with neurosurgery for periodic imaging study.  Her last MRI of the brain was in Aug 11, 2015 which show stable meningioma  She was found to have abnormal CBC from recent blood work.  On review of her blood work, it is noted that she has progressive anemia over the last few years, low was around 8.8 In July of this year, she had iron studies which showed normal iron storage.  She is noted to have progressive renal disease. Starting around 2018, she had progressive renal dysfunction, with serum creatinine fluctuate usually under 2 but in January of this year, her creatinine went up to as high as 2.2. Estimated EGFR put her at chronic kidney disease stage III.  She has multiple cardiovascular risk factors including poorly controlled diabetes, peripheral vascular disease, chronic kidney disease, coronary artery disease and hypertension  She denies recent chest pain on exertion, shortness of breath on minimal exertion, pre-syncopal episodes, or palpitations. She had not noticed any recent bleeding such as epistaxis, hematuria or hematochezia The patient denies over the counter NSAID ingestion. She is on antiplatelets agents.  She denies any pica and eats a variety of  diet. She had received blood transfusion when she was diagnosed with colon cancer Her granddaughter noticed recurrent falls.  She denies generalized weakness.  She denies peripheral neuropathy from prior treatment or diabetes She is started on Epoetin injection for anemia chronic kidney disease on February 10, 2020   I have reviewed  the past medical history, past surgical history, social history and family history with the patient and they are unchanged from previous note.  ALLERGIES:  is allergic to metformin and related.  MEDICATIONS:  Current Outpatient Medications  Medication Sig Dispense Refill   acetaminophen (TYLENOL) 500 MG tablet Take 1,000 mg by mouth every 6 (six) hours as needed (for pain.).     amLODipine (NORVASC) 5 MG tablet TAKE 1 TABLET(5 MG) BY MOUTH DAILY 90 tablet 3   aspirin 81 MG chewable tablet Chew 81 mg by mouth daily.     Blood Glucose Monitoring Suppl (ONETOUCH VERIO) w/Device KIT 1 each by Does not apply route daily. 1 kit 0   clopidogrel (PLAVIX) 75 MG tablet TAKE 1 TABLET(75 MG) BY MOUTH DAILY 90 tablet 2   famotidine (PEPCID) 20 MG tablet TAKE 1 TABLET(20 MG) BY MOUTH TWICE DAILY AS NEEDED FOR HEARTBURN OR INDIGESTION 90 tablet 2   glucose blood (ONETOUCH VERIO) test strip Use as instructed 100 each 0   insulin glargine (LANTUS SOLOSTAR) 100 UNIT/ML Solostar Pen Inject 15 Units into the skin daily at 10 pm. 15 mL 1   Insulin Pen Needle 31G X 5 MM MISC Check blood sugar once daily 100 each 2   isosorbide mononitrate (IMDUR) 60 MG 24 hr tablet TAKE 1 TABLET(60 MG) BY MOUTH DAILY (Patient taking differently: Take 60 mg by mouth daily.) 90 tablet 3   Lancet Devices (ONE TOUCH DELICA LANCING DEV) MISC Check blood sugar once daily 1 each 0   Lancets MISC Use as directed to test blood glucose daily 100 each 0   nitroGLYCERIN (NITROSTAT) 0.4 MG SL tablet Place 1 tablet (0.4 mg total) under the tongue every 5 (five) minutes as needed for chest pain. 25 tablet 11   polyethylene glycol powder (GLYCOLAX/MIRALAX) 17 GM/SCOOP powder Take 17 g by mouth daily. 3350 g 1   quinapril-hydrochlorothiazide (ACCURETIC) 20-12.5 MG tablet TAKE 1 TABLET BY MOUTH DAILY 90 tablet 3   rosuvastatin (CRESTOR) 20 MG tablet TAKE 1 TABLET(20 MG) BY MOUTH DAILY 90 tablet 3   No current facility-administered medications for  this visit.     REVIEW OF SYSTEMS:   Constitutional: Denies fevers, chills or night sweats Ears, nose, mouth, throat, and face: Denies mucositis or sore throat Respiratory: Denies cough, dyspnea or wheezes Cardiovascular: Denies palpitation, chest discomfort or lower extremity swelling Gastrointestinal:  Denies nausea, heartburn or change in bowel habits Skin: Denies abnormal skin rashes Lymphatics: Denies new lymphadenopathy or easy bruising Neurological:Denies numbness, tingling or new weaknesses Behavioral/Psych: Mood is stable, no new changes  All other systems were reviewed with the patient and are negative.  PHYSICAL EXAMINATION: ECOG PERFORMANCE STATUS: 0 - Asymptomatic  Vitals:   02/18/21 1206  BP: (!) 151/56  Pulse: (!) 57  Resp: 18  Temp: 97.7 F (36.5 C)  SpO2: 100%   Filed Weights   02/18/21 1206  Weight: 138 lb 12.8 oz (63 kg)    GENERAL:alert, no distress and comfortable NEURO: alert & oriented x 3 with fluent speech, no focal motor/sensory deficits  LABORATORY DATA:  I have reviewed the data as listed     Component Value Date/Time  NA 142 02/18/2021 1143   NA 140 04/09/2020 1024   NA 143 05/15/2014 1154   K 3.6 02/18/2021 1143   K 3.6 05/15/2014 1154   CL 109 02/18/2021 1143   CL 101 05/11/2012 1544   CO2 25 02/18/2021 1143   CO2 24 05/15/2014 1154   GLUCOSE 179 (H) 02/18/2021 1143   GLUCOSE 143 (H) 05/15/2014 1154   GLUCOSE 272 (H) 05/11/2012 1544   BUN 34 (H) 02/18/2021 1143   BUN 38 (H) 04/09/2020 1024   BUN 17.3 05/15/2014 1154   CREATININE 1.47 (H) 02/18/2021 1143   CREATININE 1.48 (H) 10/28/2020 1208   CREATININE 1.06 (H) 07/20/2015 1641   CREATININE 1.0 05/15/2014 1154   CALCIUM 9.2 02/18/2021 1143   CALCIUM 9.6 05/15/2014 1154   PROT 7.0 02/18/2021 1143   PROT 6.8 12/08/2020 1051   PROT 7.0 05/15/2014 1154   ALBUMIN 3.6 02/18/2021 1143   ALBUMIN 4.2 12/08/2020 1051   ALBUMIN 3.7 05/15/2014 1154   AST 17 02/18/2021 1143   AST  19 10/28/2020 1208   AST 13 05/15/2014 1154   ALT 11 02/18/2021 1143   ALT 15 10/28/2020 1208   ALT 9 05/15/2014 1154   ALKPHOS 63 02/18/2021 1143   ALKPHOS 94 05/15/2014 1154   BILITOT 0.7 02/18/2021 1143   BILITOT 0.6 12/08/2020 1051   BILITOT 0.7 10/28/2020 1208   BILITOT 0.68 05/15/2014 1154   GFRNONAA 37 (L) 02/18/2021 1143   GFRNONAA 37 (L) 10/28/2020 1208   GFRNONAA 54 (L) 07/20/2015 1641   GFRAA 36 (L) 04/09/2020 1024   GFRAA 62 07/20/2015 1641    No results found for: SPEP, UPEP  Lab Results  Component Value Date   WBC 6.5 02/18/2021   NEUTROABS 3.9 02/18/2021   HGB 9.5 (L) 02/18/2021   HCT 30.4 (L) 02/18/2021   MCV 86.4 02/18/2021   PLT 209 02/18/2021      Chemistry      Component Value Date/Time   NA 142 02/18/2021 1143   NA 140 04/09/2020 1024   NA 143 05/15/2014 1154   K 3.6 02/18/2021 1143   K 3.6 05/15/2014 1154   CL 109 02/18/2021 1143   CL 101 05/11/2012 1544   CO2 25 02/18/2021 1143   CO2 24 05/15/2014 1154   BUN 34 (H) 02/18/2021 1143   BUN 38 (H) 04/09/2020 1024   BUN 17.3 05/15/2014 1154   CREATININE 1.47 (H) 02/18/2021 1143   CREATININE 1.48 (H) 10/28/2020 1208   CREATININE 1.06 (H) 07/20/2015 1641   CREATININE 1.0 05/15/2014 1154      Component Value Date/Time   CALCIUM 9.2 02/18/2021 1143   CALCIUM 9.6 05/15/2014 1154   ALKPHOS 63 02/18/2021 1143   ALKPHOS 94 05/15/2014 1154   AST 17 02/18/2021 1143   AST 19 10/28/2020 1208   AST 13 05/15/2014 1154   ALT 11 02/18/2021 1143   ALT 15 10/28/2020 1208   ALT 9 05/15/2014 1154   BILITOT 0.7 02/18/2021 1143   BILITOT 0.6 12/08/2020 1051   BILITOT 0.7 10/28/2020 1208   BILITOT 0.68 05/15/2014 1154

## 2021-02-18 NOTE — Assessment & Plan Note (Signed)
She has been getting 20,000 units of erythropoietin stimulating agent for the past 6 months and her hemoglobin barely reach above 10 We discussed the risk and benefits of increasing the dose to 40,000 units but keeping the interval between injection at every 4 weeks and she is in agreement I plan to see her again in 6 months for further follow-up

## 2021-02-24 ENCOUNTER — Other Ambulatory Visit: Payer: Self-pay | Admitting: *Deleted

## 2021-02-24 ENCOUNTER — Encounter: Payer: Self-pay | Admitting: *Deleted

## 2021-02-24 DIAGNOSIS — R809 Proteinuria, unspecified: Secondary | ICD-10-CM

## 2021-02-24 NOTE — Patient Instructions (Signed)
Visit Information  PATIENT GOALS/PLAN OF CARE:  Care Plan : RN Care Manager Plan of Care  Updates made by [name], RN since 02/24/2021 12:00 AM     Problem: THN-Knowledge Deficit related to Diabetes Management and Care Coordination Needs   Priority: High      The patient verbalized understanding of instructions, educational materials, and care plan provided today and agreed to receive a mailed copy of patient instructions, educational materials, and care plan.   Telephone follow up appointment with care management team member scheduled for: The patient has been provided with contact information for the care management team and has been advised to call with any health related questions or concerns.  The [facility] team will follow up with the patient and will provide direct communication to the PCP for this patient.   SIGNATURE  Raina Mina, RN Care Management Coordinator [place] Office [phone]

## 2021-02-24 NOTE — Patient Outreach (Signed)
[facility]) Care Management  [place]  02/24/2021   [name] 1945-07-22 [id number]  Subjective:  Pt receptive to the discussed plan of care and indicated she would comply to all discussed today.   Objective:   Encounter Medications:  Outpatient Encounter Medications as of 02/24/2021  Medication Sig   acetaminophen (TYLENOL) 500 MG tablet Take 1,000 mg by mouth every 6 (six) hours as needed (for pain.).   amLODipine (NORVASC) 5 MG tablet TAKE 1 TABLET(5 MG) BY MOUTH DAILY   aspirin 81 MG chewable tablet Chew 81 mg by mouth daily.   Blood Glucose Monitoring Suppl (ONETOUCH VERIO) w/Device KIT 1 each by Does not apply route daily.   clopidogrel (PLAVIX) 75 MG tablet TAKE 1 TABLET(75 MG) BY MOUTH DAILY   famotidine (PEPCID) 20 MG tablet TAKE 1 TABLET(20 MG) BY MOUTH TWICE DAILY AS NEEDED FOR HEARTBURN OR INDIGESTION   glucose blood (ONETOUCH VERIO) test strip Use as instructed   isosorbide mononitrate (IMDUR) 60 MG 24 hr tablet TAKE 1 TABLET(60 MG) BY MOUTH DAILY (Patient taking differently: Take 60 mg by mouth daily.)   nitroGLYCERIN (NITROSTAT) 0.4 MG SL tablet Place 1 tablet (0.4 mg total) under the tongue every 5 (five) minutes as needed for chest pain.   polyethylene glycol powder (GLYCOLAX/MIRALAX) 17 GM/SCOOP powder Take 17 g by mouth daily. (Patient taking differently: Take 17 g by mouth daily. Pt was taking 3 x weekly however will start taking daily due to ongoing constipation.)   quinapril-hydrochlorothiazide (ACCURETIC) 20-12.5 MG tablet TAKE 1 TABLET BY MOUTH DAILY   rosuvastatin (CRESTOR) 20 MG tablet TAKE 1 TABLET(20 MG) BY MOUTH DAILY   insulin glargine (LANTUS SOLOSTAR) 100 UNIT/ML Solostar Pen Inject 15 Units into the skin daily at 10 pm. (Patient not taking: Reported on 02/24/2021)   Insulin Pen Needle 31G X 5 MM MISC Check blood sugar once daily (Patient not taking: Reported on 02/24/2021)   Lancet Devices (ONE TOUCH DELICA LANCING DEV)  MISC Check blood sugar once daily (Patient not taking: Reported on 02/24/2021)   Lancets MISC Use as directed to test blood glucose daily (Patient not taking: Reported on 02/24/2021)   No facility-administered encounter medications on file as of 02/24/2021.    Functional Status:  In your present state of health, do you have any difficulty performing the following activities: 02/24/2021 03/23/2020  Hearing? N N  Vision? Y N  Comment Left eye blindness -  Difficulty concentrating or making decisions? N N  Walking or climbing stairs? N N  Dressing or bathing? N N  Doing errands, shopping? Y N  Comment Assist with this task -  Preparing Food and eating ? N -  Using the Toilet? N -  In the past six months, have you accidently leaked urine? N -  Do you have problems with loss of bowel control? N -  Managing your Medications? Y -  Comment Daughter assist with pill box -  Managing your Finances? [place] with this task -  Housekeeping or managing your Housekeeping? Y -  Comment assistance with this task -  Some recent data might be hidden    Fall/Depression Screening: Fall Risk  02/24/2021 10/01/2020 01/27/2020  Falls in the past year? 0 0 0  Number falls in past yr: - 0 0  Injury with Fall? - 0 0  Comment - - -  Risk Factor Category  - - -  Risk for fall due to : - - -  Follow up - - -   PHQ 2/9 Scores 02/24/2021 10/01/2020 01/27/2020 11/26/2019 10/30/2019 05/24/2018 04/12/2018  PHQ - 2 Score 0 0 0 0 1 0 0  PHQ- 9 Score - 0 0 0 3 - -    Assessment:   Care Plan Care Plan : [place] of Care  Updates made by [name], RN since 02/24/2021 12:00 AM     Problem: THN-Knowledge Deficit related to Diabetes Management and Care Coordination Needs   Priority: High       Goals Addressed             This Visit's Progress    Develoment Plan of Care Management for Diabetes and Complex Care Coordination Needs (Diabetes)        Current Barriers:   Knowledge Deficits related to plan of care for management of DMII Care Coordination needs related to Transportation, Housing barriers, and Lacks knowledge of community resource: DME for referrals via [facility] for pharmacy and care-guides Chronic Disease Management support and education needs related to DMII Transportation barriers  RNCM Clinical Goal(s):  Patient will verbalize understanding of plan for management of DMII verbalize basic understanding of  DMII disease process and self health management plan of care take all medications exactly as prescribed and will call provider for medication related questions attend all scheduled medical appointments: Discussed today  continue to work with RN Care Manager to address care management and care coordination needs related to  DMII through collaboration with RN Care manager, provider, and care team.   Interventions: Inter-disciplinary care team collaboration (see longitudinal plan of care) Evaluation of current treatment plan related to  self management and patient's adherence to plan as established by provider   Diabetes Interventions: Assessed patient's understanding of A1c goal: <6.5% Provided education to patient about basic DM disease process; Reviewed medications with patient and discussed importance of medication adherence; Discussed plans with patient for ongoing care management follow up and provided patient with direct contact information for care management team; Provided patient with written educational materials related to hypo and hyperglycemia and importance of correct treatment; Reviewed scheduled/upcoming provider appointments including: PCP and oncologist; Referral made to pharmacy team for assistance with DME for voice response for CBG read out on monitoring her ongoing glucose readings; Referral made to community resources care guide team for assistance with Transportation and affordable housing information; Screening for  signs and symptoms of depression related to chronic disease state;  Assessed social determinant of health barriers;  Lab Results  Component Value Date   HGBA1C 9.7 (A) 10/01/2020   Patient Goals/Self-Care Activities: Patient will self administer medications as prescribed Patient will attend all scheduled provider appointments Patient will call pharmacy for medication refills Patient will continue to perform ADL's independently Patient will continue to perform IADL's independently Patient will call provider office for new concerns or questions  Follow Up Plan:  Telephone follow up appointment with care management team member scheduled for:  Dec 2022 The patient has been provided with contact information for the care management team and has been advised to call with any health related questions or concerns.  The [facility] Pharm[facility] team will follow up with the patient and will provide direct communication to the PCP for this patient.          Plan:  Follow-up: Patient agrees to Care Plan and Follow-up. Dec 2022  SUMMARY: -Referral to pharmacy [facility] to address device for diabetes monitoring that can verbalized a reading via [employer] or  [employer] . Referral to Care-Guides for another source of transportation ([employer]) and afford housing within the community.  Raina Mina, RN Care Management Coordinator [place] Office [phone]

## 2021-02-25 NOTE — Patient Outreach (Signed)
[facility] [facility]) Care Management  02/25/2021  [name] 1946/01/25 [id number]   Referral for medication assistance from Raina Mina, RN sent to [place].  [facility] Management Assistant [phone]

## 2021-02-26 ENCOUNTER — Other Ambulatory Visit: Payer: Self-pay | Admitting: Family Medicine

## 2021-02-26 ENCOUNTER — Telehealth: Payer: Self-pay

## 2021-02-26 NOTE — Telephone Encounter (Signed)
   Telephone encounter was:  Successful.  02/26/2021 Name: Devita Nies. Stonerock MRN: [id number] DOB: 06/01/1945  Stanton Kidney L. Brickner is a 75 y.o. year old female who is a primary care patient of Hagler, Apolonio Schneiders, MD . The community resource team was consulted for assistance with Transportation Needs  and housing.  Care guide performed the following interventions: Spoke with patient about [employer], [employer] and [employer].  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  Follow Up Plan:  Care guide will follow up with patient by phone over the next 7-10 days.  Greidy Sherard, AAS Paralegal, [place] Management  [address], [place] [address] ??[email or web address]  ?? [phone]   [email or web address]

## 2021-03-02 ENCOUNTER — Telehealth: Payer: Self-pay

## 2021-03-02 NOTE — Telephone Encounter (Signed)
   Telephone encounter was:  Successful.  03/02/2021 Name: [name]. Galgano MRN: [id number] DOB: [birth year]  [name] is a 75 y.o. year old female who is a primary care patient of Hagler, Apolonio Schneiders, MD . The community resource team was consulted for assistance with Transportation Needs  and housing  Care guide performed the following interventions: Spoke with patient she has the contact information for [employer] transportation, [employer] and [employer] and will call them.   Follow Up Plan:  No further follow up planned at this time. The patient has been provided with needed resources.  Jnaya Butrick, AAS Paralegal, [place] Management  [address],  [address] ??[email or web address]  ?? [phone]   [email or web address]

## 2021-03-10 ENCOUNTER — Encounter (INDEPENDENT_AMBULATORY_CARE_PROVIDER_SITE_OTHER): Payer: Medicare Other | Admitting: Ophthalmology

## 2021-03-10 ENCOUNTER — Other Ambulatory Visit: Payer: Self-pay

## 2021-03-10 DIAGNOSIS — E113512 Type 2 diabetes mellitus with proliferative diabetic retinopathy with macular edema, left eye: Secondary | ICD-10-CM | POA: Diagnosis not present

## 2021-03-10 DIAGNOSIS — I10 Essential (primary) hypertension: Secondary | ICD-10-CM

## 2021-03-10 DIAGNOSIS — H4312 Vitreous hemorrhage, left eye: Secondary | ICD-10-CM

## 2021-03-10 DIAGNOSIS — E113591 Type 2 diabetes mellitus with proliferative diabetic retinopathy without macular edema, right eye: Secondary | ICD-10-CM | POA: Diagnosis not present

## 2021-03-10 DIAGNOSIS — H35033 Hypertensive retinopathy, bilateral: Secondary | ICD-10-CM

## 2021-03-10 DIAGNOSIS — H43813 Vitreous degeneration, bilateral: Secondary | ICD-10-CM | POA: Diagnosis not present

## 2021-03-12 ENCOUNTER — Other Ambulatory Visit: Payer: Self-pay | Admitting: *Deleted

## 2021-03-12 MED ORDER — ISOSORBIDE MONONITRATE ER 60 MG PO TB24: 60.0000 mg | ORAL_TABLET | Freq: Every day | ORAL | 3 refills | Status: DC

## 2021-03-17 ENCOUNTER — Telehealth: Payer: Self-pay | Admitting: Interventional Cardiology

## 2021-03-17 MED ORDER — CLOPIDOGREL BISULFATE 75 MG PO TABS: ORAL_TABLET | ORAL | 2 refills | Status: DC

## 2021-03-17 MED ORDER — QUINAPRIL-HYDROCHLOROTHIAZIDE 20-12.5 MG PO TABS
1.0000 | ORAL_TABLET | Freq: Every day | ORAL | 2 refills | Status: DC
Start: 2021-03-17 — End: 2021-04-23

## 2021-03-17 MED ORDER — AMLODIPINE BESYLATE 5 MG PO TABS: ORAL_TABLET | ORAL | 2 refills | Status: DC

## 2021-03-17 NOTE — Telephone Encounter (Signed)
Pt's medications were sent to pt's pharmacy as requested. Confirmation received.  

## 2021-03-17 NOTE — Telephone Encounter (Signed)
*  STAT* If patient is at the pharmacy, call can be transferred to refill team.   1. Which medications need to be refilled? (please list name of each medication and dose if known)   quinapril-hydrochlorothiazide (ACCURETIC) 20-12.5 MG tablet amLODipine (NORVASC) 5 MG tablet clopidogrel (PLAVIX) 75 MG tablet   2. Which pharmacy/location (including street and city if local pharmacy) is medication to be sent to? Needs to go to [facility] fax # [phone]  3. Do they need a 30 day or 90 day supply? 90 day

## 2021-03-18 ENCOUNTER — Inpatient Hospital Stay: Payer: Medicare Other

## 2021-03-18 ENCOUNTER — Other Ambulatory Visit: Payer: Self-pay

## 2021-03-18 ENCOUNTER — Inpatient Hospital Stay: Payer: Medicare Other | Attending: Hematology and Oncology

## 2021-03-18 VITALS — BP 125/48 | HR 58 | Temp 98.9°F | Resp 16

## 2021-03-18 DIAGNOSIS — D631 Anemia in chronic kidney disease: Secondary | ICD-10-CM | POA: Insufficient documentation

## 2021-03-18 DIAGNOSIS — N1832 Chronic kidney disease, stage 3b: Secondary | ICD-10-CM

## 2021-03-18 DIAGNOSIS — D539 Nutritional anemia, unspecified: Secondary | ICD-10-CM

## 2021-03-18 LAB — CBC WITH DIFFERENTIAL/PLATELET
Abs Immature Granulocytes: 0.02 10*3/uL (ref 0.00–0.07)
Basophils Absolute: 0.1 10*3/uL (ref 0.0–0.1)
Basophils Relative: 1 %
Eosinophils Absolute: 0.1 10*3/uL (ref 0.0–0.5)
Eosinophils Relative: 2 %
HCT: 31.6 % — ABNORMAL LOW (ref 36.0–46.0)
Hemoglobin: 10.2 g/dL — ABNORMAL LOW (ref 12.0–15.0)
Immature Granulocytes: 0 %
Lymphocytes Relative: 29 %
Lymphs Abs: 2 10*3/uL (ref 0.7–4.0)
MCH: 27.7 pg (ref 26.0–34.0)
MCHC: 32.3 g/dL (ref 30.0–36.0)
MCV: 85.9 fL (ref 80.0–100.0)
Monocytes Absolute: 0.5 10*3/uL (ref 0.1–1.0)
Monocytes Relative: 7 %
Neutro Abs: 4.2 10*3/uL (ref 1.7–7.7)
Neutrophils Relative %: 61 %
Platelets: 215 10*3/uL (ref 150–400)
RBC: 3.68 MIL/uL — ABNORMAL LOW (ref 3.87–5.11)
RDW: 14.5 % (ref 11.5–15.5)
WBC: 6.8 10*3/uL (ref 4.0–10.5)
nRBC: 0 % (ref 0.0–0.2)

## 2021-03-18 MED ORDER — EPOETIN ALFA-EPBX 40000 UNIT/ML IJ SOLN
40000.0000 [IU] | Freq: Once | INTRAMUSCULAR | Status: AC
  Administered 2021-03-18: 40000 [IU] via SUBCUTANEOUS
  Filled 2021-03-18: qty 1

## 2021-03-18 NOTE — Patient Instructions (Signed)
Epoetin Alfa injection °What is this medication? °EPOETIN ALFA (e POE e tin AL fa) helps your body make more red blood cells. This medicine is used to treat anemia caused by chronic kidney disease, cancer chemotherapy, or HIV-therapy. It may also be used before surgery if you have anemia. °This medicine may be used for other purposes; ask your health care provider or pharmacist if you have questions. °COMMON BRAND NAME(S): Epogen, Procrit, Retacrit °What should I tell my care team before I take this medication? °They need to know if you have any of these conditions: °cancer °heart disease °high blood pressure °history of blood clots °history of stroke °low levels of folate, iron, or vitamin B12 in the blood °seizures °an unusual or allergic reaction to erythropoietin, albumin, benzyl alcohol, hamster proteins, other medicines, foods, dyes, or preservatives °pregnant or trying to get pregnant °breast-feeding °How should I use this medication? °This medicine is for injection into a vein or under the skin. It is usually given by a health care professional in a hospital or clinic setting. °If you get this medicine at home, you will be taught how to prepare and give this medicine. Use exactly as directed. Take your medicine at regular intervals. Do not take your medicine more often than directed. °It is important that you put your used needles and syringes in a special sharps container. Do not put them in a trash can. If you do not have a sharps container, call your pharmacist or healthcare provider to get one. °A special MedGuide will be given to you by the pharmacist with each prescription and refill. Be sure to read this information carefully each time. °Talk to your pediatrician regarding the use of this medicine in children. While this drug may be prescribed for selected conditions, precautions do apply. °Overdosage: If you think you have taken too much of this medicine contact a poison control center or emergency  room at once. °NOTE: This medicine is only for you. Do not share this medicine with others. °What if I miss a dose? °If you miss a dose, take it as soon as you can. If it is almost time for your next dose, take only that dose. Do not take double or extra doses. °What may interact with this medication? °Interactions have not been studied. °This list may not describe all possible interactions. Give your health care provider a list of all the medicines, herbs, non-prescription drugs, or dietary supplements you use. Also tell them if you smoke, drink alcohol, or use illegal drugs. Some items may interact with your medicine. °What should I watch for while using this medication? °Your condition will be monitored carefully while you are receiving this medicine. °You may need blood work done while you are taking this medicine. °This medicine may cause a decrease in vitamin B6. You should make sure that you get enough vitamin B6 while you are taking this medicine. Discuss the foods you eat and the vitamins you take with your health care professional. °What side effects may I notice from receiving this medication? °Side effects that you should report to your doctor or health care professional as soon as possible: °allergic reactions like skin rash, itching or hives, swelling of the face, lips, or tongue °seizures °signs and symptoms of a blood clot such as breathing problems; changes in vision; chest pain; severe, sudden headache; pain, swelling, warmth in the leg; trouble speaking; sudden numbness or weakness of the face, arm or leg °signs and symptoms of a stroke like   changes in vision; confusion; trouble speaking or understanding; severe headaches; sudden numbness or weakness of the face, arm or leg; trouble walking; dizziness; loss of balance or coordination °Side effects that usually do not require medical attention (report to your doctor or health care professional if they continue or are  bothersome): °chills °cough °dizziness °fever °headaches °joint pain °muscle cramps °muscle pain °nausea, vomiting °pain, redness, or irritation at site where injected °This list may not describe all possible side effects. Call your doctor for medical advice about side effects. You may report side effects to FDA at 1-800-FDA-1088. °Where should I keep my medication? °Keep out of the reach of children. °Store in a refrigerator between 2 and 8 degrees C (36 and 46 degrees F). Do not freeze or shake. Throw away any unused portion if using a single-dose vial. Multi-dose vials can be kept in the refrigerator for up to 21 days after the initial dose. Throw away unused medicine. °NOTE: This sheet is a summary. It may not cover all possible information. If you have questions about this medicine, talk to your doctor, pharmacist, or health care provider. °© 2022 [employer]vier/Gold Standard (2016-11-29 00:00:00) ° °

## 2021-03-22 ENCOUNTER — Other Ambulatory Visit: Payer: Self-pay | Admitting: *Deleted

## 2021-03-22 NOTE — Patient Instructions (Signed)
Visit Information  Thank you for taking time to visit with me today. Please don't hesitate to contact me if I can be of assistance to you before our next scheduled telephone appointment.    

## 2021-03-22 NOTE — Patient Outreach (Signed)
[place] [facility]) Care Management  [place]  03/22/2021   Stanton Kidney L. Litzenberger 02-Oct-1945 [id number]  Pt doing much better and verified she has received the information requested from the care-guides for housing and transportation. [facility] also working [facility] at her provider's office for device monitoring for her diabetes. Pt states she has received the approval letter now awaiting for the device for daily monitoring.  Will follow up after the holidays with ongoing adherence to the plan of care discussed today.  Encounter Medications:  Outpatient Encounter Medications as of 03/22/2021  Medication Sig   glipiZIDE (GLUCOTROL) 5 MG tablet Take 5 mg by mouth daily before breakfast.   acetaminophen (TYLENOL) 500 MG tablet Take 1,000 mg by mouth every 6 (six) hours as needed (for pain.).   amLODipine (NORVASC) 5 MG tablet TAKE 1 TABLET(5 MG) BY MOUTH DAILY   aspirin 81 MG chewable tablet Chew 81 mg by mouth daily.   Blood Glucose Monitoring Suppl (ONETOUCH VERIO) w/Device KIT 1 each by Does not apply route daily.   clopidogrel (PLAVIX) 75 MG tablet TAKE 1 TABLET(75 MG) BY MOUTH DAILY   famotidine (PEPCID) 20 MG tablet TAKE 1 TABLET(20 MG) BY MOUTH TWICE DAILY AS NEEDED FOR HEARTBURN OR INDIGESTION   glucose blood (ONETOUCH VERIO) test strip USE AS DIRECTED TO TEST BLOOD GLUCOSE DAILY   insulin glargine (LANTUS SOLOSTAR) 100 UNIT/ML Solostar Pen Inject 15 Units into the skin daily at 10 pm. (Patient not taking: Reported on 02/24/2021)   Insulin Pen Needle 31G X 5 MM MISC Check blood sugar once daily (Patient not taking: Reported on 02/24/2021)   isosorbide mononitrate (IMDUR) 60 MG 24 hr tablet Take 1 tablet (60 mg total) by mouth daily. TAKE 1 TABLET(60 MG) BY MOUTH DAILY Strength: 60 mg   Lancet Devices (ONE TOUCH DELICA LANCING DEV) MISC Check blood sugar once daily (Patient not taking: Reported on 02/24/2021)   Lancets MISC Use as directed to test blood glucose  daily (Patient not taking: Reported on 02/24/2021)   nitroGLYCERIN (NITROSTAT) 0.4 MG SL tablet Place 1 tablet (0.4 mg total) under the tongue every 5 (five) minutes as needed for chest pain.   polyethylene glycol powder (GLYCOLAX/MIRALAX) 17 GM/SCOOP powder Take 17 g by mouth daily. (Patient taking differently: Take 17 g by mouth daily. Pt was taking 3 x weekly however will start taking daily due to ongoing constipation.)   quinapril-hydrochlorothiazide (ACCURETIC) 20-12.5 MG tablet Take 1 tablet by mouth daily.   rosuvastatin (CRESTOR) 20 MG tablet TAKE 1 TABLET(20 MG) BY MOUTH DAILY   No facility-administered encounter medications on file as of 03/22/2021.    Functional Status:  In your present state of health, do you have any difficulty performing the following activities: 02/24/2021 03/23/2020  Hearing? N N  Vision? Y N  Comment Left eye blindness -  Difficulty concentrating or making decisions? N N  Walking or climbing stairs? N N  Dressing or bathing? N N  Doing errands, shopping? Y N  Comment Assist with this task -  Preparing Food and eating ? N -  Using the Toilet? N -  In the past six months, have you accidently leaked urine? N -  Do you have problems with loss of bowel control? N -  Managing your Medications? Y -  Comment Daughter assist with pill box -  Managing your Finances? Ross with this task -  Housekeeping or managing your Housekeeping? Y -  Comment assistance with  this task -  Some recent data might be hidden    Fall/Depression Screening: Fall Risk  02/24/2021 10/01/2020 01/27/2020  Falls in the past year? 0 0 0  Number falls in past yr: - 0 0  Injury with Fall? - 0 0  Comment - - -  Risk Factor Category  - - -  Risk for fall due to : - - -  Follow up - - -   PHQ 2/9 Scores 02/24/2021 10/01/2020 01/27/2020 11/26/2019 10/30/2019 05/24/2018 04/12/2018  PHQ - 2 Score 0 0 0 0 1 0 0  PHQ- 9 Score - 0 0 0 3 - -    Assessment:   Care  Plan There are no care plans that you recently modified to display for this patient.    Goals Addressed             This Visit's Progress    Develoment Plan of Care Management for Diabetes and Complex Care Coordination Needs (Diabetes)   On track     Current Barriers:  Knowledge Deficits related to plan of care for management of DMII  Chronic Disease Management support and education needs related to DMII   RNCM Clinical Goal(s):  Patient will verbalize understanding of plan for management of DMII as evidenced by reduce Ac1 and daily CBG once pt's device has been received via [facility] assistance. verbalize basic understanding of  DMII disease process and self health management plan as evidenced by of care adherence to daily CBG once device received. take all medications exactly as prescribed and will call provider for medication related questions as evidenced by working with [facility] through her provider's office her diabetic monitoring device and recent changes in her diabetic medications. continue to work with RN Care Manager to address care management and care coordination needs related to  DMII as evidenced by adherence to CM Team Scheduled appointments through collaboration with RN Care manager, provider, and care team.   Interventions: Inter-disciplinary care team collaboration (see longitudinal plan of care) Evaluation of current treatment plan related to  self management and patient's adherence to plan as established by provider   Diabetes Interventions: Assessed patient's understanding of A1c goal: <6.5% Provided education to patient about basic DM disease process Reviewed medications with patient and discussed importance of medication adherence Discussed plans with patient for ongoing care management follow up and provided patient with direct contact information for care management team Provided patient with written educational materials related to hypo and  hyperglycemia and importance of correct treatment Screening for signs and symptoms of depression related to chronic disease state  Assessed social determinant of health barriers Lab Results  Component Value Date   HGBA1C 9.7 (A) 10/01/2020   Patient Goals/Self-Care Activities: Patient will self administer medications as prescribed Patient will attend all scheduled provider appointments Patient will call pharmacy for medication refills Patient will continue to perform ADL's independently Patient will continue to perform IADL's independently Patient will call provider office for new concerns or questions  Follow Up Plan:  Telephone follow up appointment with care management team member scheduled for:  Jan 2023 The patient has been provided with contact information for the care management team and has been advised to call with any health related questions or concerns.  [facility] team will follow up with the patient and will provide direct communication to the PCP for this patient.          , RN Care Management Coordinator [facility]   Office [phone]

## 2021-04-11 ENCOUNTER — Encounter: Payer: Self-pay | Admitting: Hematology and Oncology

## 2021-04-12 ENCOUNTER — Encounter: Payer: Self-pay | Admitting: Hematology and Oncology

## 2021-04-13 ENCOUNTER — Other Ambulatory Visit: Payer: Self-pay | Admitting: Interventional Cardiology

## 2021-04-14 ENCOUNTER — Other Ambulatory Visit: Payer: Self-pay

## 2021-04-14 ENCOUNTER — Encounter (INDEPENDENT_AMBULATORY_CARE_PROVIDER_SITE_OTHER): Payer: Commercial Managed Care - HMO | Admitting: Ophthalmology

## 2021-04-14 DIAGNOSIS — H35033 Hypertensive retinopathy, bilateral: Secondary | ICD-10-CM | POA: Diagnosis not present

## 2021-04-14 DIAGNOSIS — I10 Essential (primary) hypertension: Secondary | ICD-10-CM | POA: Diagnosis not present

## 2021-04-14 DIAGNOSIS — E113512 Type 2 diabetes mellitus with proliferative diabetic retinopathy with macular edema, left eye: Secondary | ICD-10-CM

## 2021-04-14 DIAGNOSIS — E113591 Type 2 diabetes mellitus with proliferative diabetic retinopathy without macular edema, right eye: Secondary | ICD-10-CM

## 2021-04-14 DIAGNOSIS — H43813 Vitreous degeneration, bilateral: Secondary | ICD-10-CM | POA: Diagnosis not present

## 2021-04-15 ENCOUNTER — Inpatient Hospital Stay: Payer: Medicare Other

## 2021-04-15 ENCOUNTER — Inpatient Hospital Stay: Payer: Medicare Other | Attending: Hematology and Oncology

## 2021-04-15 DIAGNOSIS — D539 Nutritional anemia, unspecified: Secondary | ICD-10-CM

## 2021-04-15 DIAGNOSIS — N1832 Chronic kidney disease, stage 3b: Secondary | ICD-10-CM | POA: Diagnosis not present

## 2021-04-15 DIAGNOSIS — D631 Anemia in chronic kidney disease: Secondary | ICD-10-CM | POA: Diagnosis not present

## 2021-04-15 LAB — CBC WITH DIFFERENTIAL/PLATELET
Abs Immature Granulocytes: 0.04 10*3/uL (ref 0.00–0.07)
Basophils Absolute: 0 10*3/uL (ref 0.0–0.1)
Basophils Relative: 0 %
Eosinophils Absolute: 0.2 10*3/uL (ref 0.0–0.5)
Eosinophils Relative: 2 %
HCT: 37.3 % (ref 36.0–46.0)
Hemoglobin: 11.6 g/dL — ABNORMAL LOW (ref 12.0–15.0)
Immature Granulocytes: 1 %
Lymphocytes Relative: 27 %
Lymphs Abs: 2.1 10*3/uL (ref 0.7–4.0)
MCH: 27.2 pg (ref 26.0–34.0)
MCHC: 31.1 g/dL (ref 30.0–36.0)
MCV: 87.6 fL (ref 80.0–100.0)
Monocytes Absolute: 0.6 10*3/uL (ref 0.1–1.0)
Monocytes Relative: 7 %
Neutro Abs: 4.8 10*3/uL (ref 1.7–7.7)
Neutrophils Relative %: 63 %
Platelets: 217 10*3/uL (ref 150–400)
RBC: 4.26 MIL/uL (ref 3.87–5.11)
RDW: 14.6 % (ref 11.5–15.5)
WBC: 7.7 10*3/uL (ref 4.0–10.5)
nRBC: 0 % (ref 0.0–0.2)

## 2021-04-15 NOTE — Patient Instructions (Signed)
Epoetin Alfa injection °What is this medication? °EPOETIN ALFA (e POE e tin AL fa) helps your body make more red blood cells. This medicine is used to treat anemia caused by chronic kidney disease, cancer chemotherapy, or HIV-therapy. It may also be used before surgery if you have anemia. °This medicine may be used for other purposes; ask your health care provider or pharmacist if you have questions. °COMMON BRAND NAME(S): Epogen, Procrit, Retacrit °What should I tell my care team before I take this medication? °They need to know if you have any of these conditions: °cancer °heart disease °high blood pressure °history of blood clots °history of stroke °low levels of folate, iron, or vitamin B12 in the blood °seizures °an unusual or allergic reaction to erythropoietin, albumin, benzyl alcohol, hamster proteins, other medicines, foods, dyes, or preservatives °pregnant or trying to get pregnant °breast-feeding °How should I use this medication? °This medicine is for injection into a vein or under the skin. It is usually given by a health care professional in a hospital or clinic setting. °If you get this medicine at home, you will be taught how to prepare and give this medicine. Use exactly as directed. Take your medicine at regular intervals. Do not take your medicine more often than directed. °It is important that you put your used needles and syringes in a special sharps container. Do not put them in a trash can. If you do not have a sharps container, call your pharmacist or healthcare provider to get one. °A special MedGuide will be given to you by the pharmacist with each prescription and refill. Be sure to read this information carefully each time. °Talk to your pediatrician regarding the use of this medicine in children. While this drug may be prescribed for selected conditions, precautions do apply. °Overdosage: If you think you have taken too much of this medicine contact a poison control center or emergency  room at once. °NOTE: This medicine is only for you. Do not share this medicine with others. °What if I miss a dose? °If you miss a dose, take it as soon as you can. If it is almost time for your next dose, take only that dose. Do not take double or extra doses. °What may interact with this medication? °Interactions have not been studied. °This list may not describe all possible interactions. Give your health care provider a list of all the medicines, herbs, non-prescription drugs, or dietary supplements you use. Also tell them if you smoke, drink alcohol, or use illegal drugs. Some items may interact with your medicine. °What should I watch for while using this medication? °Your condition will be monitored carefully while you are receiving this medicine. °You may need blood work done while you are taking this medicine. °This medicine may cause a decrease in vitamin B6. You should make sure that you get enough vitamin B6 while you are taking this medicine. Discuss the foods you eat and the vitamins you take with your health care professional. °What side effects may I notice from receiving this medication? °Side effects that you should report to your doctor or health care professional as soon as possible: °allergic reactions like skin rash, itching or hives, swelling of the face, lips, or tongue °seizures °signs and symptoms of a blood clot such as breathing problems; changes in vision; chest pain; severe, sudden headache; pain, swelling, warmth in the leg; trouble speaking; sudden numbness or weakness of the face, arm or leg °signs and symptoms of a stroke like   changes in vision; confusion; trouble speaking or understanding; severe headaches; sudden numbness or weakness of the face, arm or leg; trouble walking; dizziness; loss of balance or coordination °Side effects that usually do not require medical attention (report to your doctor or health care professional if they continue or are  bothersome): °chills °cough °dizziness °fever °headaches °joint pain °muscle cramps °muscle pain °nausea, vomiting °pain, redness, or irritation at site where injected °This list may not describe all possible side effects. Call your doctor for medical advice about side effects. You may report side effects to FDA at 1-800-FDA-1088. °Where should I keep my medication? °Keep out of the reach of children. °Store in a refrigerator between 2 and 8 degrees C (36 and 46 degrees F). Do not freeze or shake. Throw away any unused portion if using a single-dose vial. Multi-dose vials can be kept in the refrigerator for up to 21 days after the initial dose. Throw away unused medicine. °NOTE: This sheet is a summary. It may not cover all possible information. If you have questions about this medicine, talk to your doctor, pharmacist, or health care provider. °© 2022 [employer]vier/Gold Standard (2016-11-29 00:00:00) ° °

## 2021-04-15 NOTE — Progress Notes (Signed)
Patient HGB results form today's labs were 11.6 which were within the hold parameters for an injection.

## 2021-04-21 ENCOUNTER — Other Ambulatory Visit: Payer: Self-pay | Admitting: *Deleted

## 2021-04-21 NOTE — Patient Outreach (Signed)
[facility]) Care Management  04/21/2021  Erryn L. Heck 10-06-1945 [id number]   Telephone Assessment-Unsuccessful  RN attempted outreach today however unsuccessful. RN able to leave a HIPAA approved voice message requesting a call back.  Will outreach once again over the next week for ongoing [facility] services.  Raina Mina, RN Care Management Coordinator [place] Office [phone]

## 2021-04-23 ENCOUNTER — Other Ambulatory Visit: Payer: Self-pay | Admitting: Interventional Cardiology

## 2021-04-26 ENCOUNTER — Other Ambulatory Visit: Payer: Self-pay | Admitting: *Deleted

## 2021-04-26 NOTE — Patient Outreach (Signed)
[place] [facility]) Care Management  [place]  04/26/2021   [name] Oct 26, 1945 [id number]  Spoke with pt today and received an update on her ongoing management of care. Pt doing well with no acute issues over the last month. Pt continue to await on her new glucometer that will readout her readings and verbally read the readings to patient. Update noted with the plan of care.   Encounter Medications:  Outpatient Encounter Medications as of 04/26/2021  Medication Sig   acetaminophen (TYLENOL) 500 MG tablet Take 1,000 mg by mouth every 6 (six) hours as needed (for pain.).   amLODipine (NORVASC) 5 MG tablet TAKE 1 TABLET(5 MG) BY MOUTH DAILY   aspirin 81 MG chewable tablet Chew 81 mg by mouth daily.   clopidogrel (PLAVIX) 75 MG tablet TAKE 1 TABLET(75 MG) BY MOUTH DAILY   dapagliflozin propanediol (FARXIGA) 10 MG TABS tablet Take 10 mg by mouth daily.   famotidine (PEPCID) 20 MG tablet TAKE 1 TABLET(20 MG) BY MOUTH TWICE DAILY AS NEEDED FOR HEARTBURN OR INDIGESTION   glipiZIDE (GLUCOTROL) 5 MG tablet Take 5 mg by mouth daily before breakfast.   isosorbide mononitrate (IMDUR) 60 MG 24 hr tablet Take 1 tablet (60 mg total) by mouth daily. TAKE 1 TABLET(60 MG) BY MOUTH DAILY Strength: 60 mg   nitroGLYCERIN (NITROSTAT) 0.4 MG SL tablet Place 1 tablet (0.4 mg total) under the tongue every 5 (five) minutes as needed for chest pain.   polyethylene glycol powder (GLYCOLAX/MIRALAX) 17 GM/SCOOP powder Take 17 g by mouth daily. (Patient taking differently: Take 17 g by mouth daily. Pt was taking 3 x weekly however will start taking daily due to ongoing constipation.)   quinapril-hydrochlorothiazide (ACCURETIC) 20-12.5 MG tablet TAKE 1 TABLET BY MOUTH DAILY   rosuvastatin (CRESTOR) 20 MG tablet TAKE 1 TABLET(20 MG) BY MOUTH DAILY   Blood Glucose Monitoring Suppl (ONETOUCH VERIO) w/Device KIT 1 each by Does not apply route daily.   glucose blood (ONETOUCH VERIO) test strip USE AS  DIRECTED TO TEST BLOOD GLUCOSE DAILY   insulin glargine (LANTUS SOLOSTAR) 100 UNIT/ML Solostar Pen Inject 15 Units into the skin daily at 10 pm. (Patient not taking: Reported on 02/24/2021)   Insulin Pen Needle 31G X 5 MM MISC Check blood sugar once daily (Patient not taking: Reported on 02/24/2021)   Lancet Devices (ONE TOUCH DELICA LANCING DEV) MISC Check blood sugar once daily (Patient not taking: Reported on 02/24/2021)   Lancets MISC Use as directed to test blood glucose daily (Patient not taking: Reported on 02/24/2021)   No facility-administered encounter medications on file as of 04/26/2021.    Functional Status:  In your present state of health, do you have any difficulty performing the following activities: 02/24/2021  Hearing? N  Vision? Y  Comment Left eye blindness  Difficulty concentrating or making decisions? N  Walking or climbing stairs? N  Dressing or bathing? N  Doing errands, shopping? Y  Comment Assist with this task  Preparing Food and eating ? N  Using the Toilet? N  In the past six months, have you accidently leaked urine? N  Do you have problems with loss of bowel control? N  Managing your Medications? Y  Comment Daughter assist with pill box  Managing your Finances? Y  Comment Assistance with this task  Housekeeping or managing your Housekeeping? Y  Comment assistance with this task  Some recent data might be hidden    Fall/Depression Screening: Fall Risk  02/24/2021  10/01/2020 01/27/2020  Falls in the past year? 0 0 0  Number falls in past yr: - 0 0  Injury with Fall? - 0 0  Comment - - -  Risk Factor Category  - - -  Risk for fall due to : - - -  Follow up - - -   PHQ 2/9 Scores 02/24/2021 10/01/2020 01/27/2020 11/26/2019 10/30/2019 05/24/2018 04/12/2018  PHQ - 2 Score 0 0 0 0 1 0 0  PHQ- 9 Score - 0 0 0 3 - -    Assessment:   Care Plan Care Plan : [place] of Care  Updates made by [name], RN since 04/26/2021 12:00 AM      Problem: [facility]-Knowledge Deficit related to Diabetes Management and Care Coordination Needs   Priority: High     Long-Range Goal: Development Plan of Care Management for Diabetes and Complex Care Coordination needs   Start Date: 02/24/2021  Expected End Date: 10/08/2021  This Visit's Progress: On track  Priority: High  Note:   Current Barriers:  Knowledge Deficits related to plan of care for management of DMII  Chronic Disease Management support and education needs related to DMII   RNCM Clinical Goal(s):  Patient will verbalize understanding of plan for management of DMII as evidenced by Self report take all medications exactly as prescribed and will call provider for medication related questions as evidenced by self report and  through collaboration with RN Care manager, provider, and care team.   Interventions: Inter-disciplinary care team collaboration (see longitudinal plan of care) Evaluation of current treatment plan related to  self management and patient's adherence to plan as established by provider   Diabetes Interventions:  (Status:  Goal on track:  Yes.) Long Term Goal Assessed patient's understanding of A1c goal: <6.5% Provided education to patient about basic DM disease process Reviewed medications with patient and discussed importance of medication adherence Discussed plans with patient for ongoing care management follow up and provided patient with direct contact information for care management team Provided patient with written educational materials related to hypo and hyperglycemia and importance of correct treatment Lab Results  Component Value Date   HGBA1C 9.7 (A) 10/01/2020   1/16 Pt continue to report awaiting on the glucometer that reads and reports pt's glucose readings. [facility] working with [facility] on obtaining this devices. 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 Pt's new medication provided  (Farxiga 10 mg) and express that this medication has been very helpful. Last provider's office read in A1C was 6/22 at 9.7.  Patient Goals/Self-Care Activities: Take all medications as prescribed Attend all scheduled provider appointments Call pharmacy for medication refills 3-7 days in advance of running out of medications Attend church or other social activities Perform all self care activities independently  Perform IADL's (shopping, preparing meals, housekeeping, managing finances) independently Call provider office for new concerns or questions  keep appointment with eye doctor check feet daily for cuts, sores or redness enter blood sugar readings and medication or insulin into daily log trim toenails straight across drink 6 to 8 glasses of water each day eat fish at least once per week fill half of plate with vegetables keep a food diary manage portion size set a realistic goal keep feet up while sitting wash and dry feet carefully every day wear comfortable, cotton socks wear comfortable, well-fitting shoes  Follow Up Plan:  Telephone follow up  appointment with care management team member scheduled for:  Feb 2023 The patient has been provided with contact information for the care management team and has been advised to call with any health related questions or concerns.       Raina Mina, RN Care Management Coordinator [place] Office [phone]

## 2021-04-26 NOTE — Patient Instructions (Addendum)
Visit Information  Thank you for taking time to visit with me today. Please don't hesitate to contact me if I can be of assistance to you before our next scheduled telephone appointment.  Following are the goals we discussed today:    Take all medications as prescribed Attend all scheduled provider appointments Call pharmacy for medication refills 3-7 days in advance of running out of medications Attend church or other social activities Perform all self care activities independently  Perform IADL's (shopping, preparing meals, housekeeping, managing finances) independently Call provider office for new concerns or questions  keep appointment with eye doctor check feet daily for cuts, sores or redness enter blood sugar readings and medication or insulin into daily log trim toenails straight across drink 6 to 8 glasses of water each day eat fish at least once per week fill half of plate with vegetables keep a food diary manage portion size set a realistic goal keep feet up while sitting wash and dry feet carefully every day wear comfortable, cotton socks wear comfortable, well-fitting shoes

## 2021-04-29 DIAGNOSIS — I10 Essential (primary) hypertension: Secondary | ICD-10-CM | POA: Diagnosis not present

## 2021-04-29 DIAGNOSIS — N1832 Chronic kidney disease, stage 3b: Secondary | ICD-10-CM | POA: Diagnosis not present

## 2021-04-29 DIAGNOSIS — D631 Anemia in chronic kidney disease: Secondary | ICD-10-CM | POA: Diagnosis not present

## 2021-04-29 DIAGNOSIS — E7800 Pure hypercholesterolemia, unspecified: Secondary | ICD-10-CM | POA: Diagnosis not present

## 2021-04-29 DIAGNOSIS — H35033 Hypertensive retinopathy, bilateral: Secondary | ICD-10-CM | POA: Diagnosis not present

## 2021-04-29 DIAGNOSIS — E118 Type 2 diabetes mellitus with unspecified complications: Secondary | ICD-10-CM | POA: Diagnosis not present

## 2021-04-29 DIAGNOSIS — I25118 Atherosclerotic heart disease of native coronary artery with other forms of angina pectoris: Secondary | ICD-10-CM | POA: Diagnosis not present

## 2021-05-05 ENCOUNTER — Encounter: Payer: Self-pay | Admitting: Hematology and Oncology

## 2021-05-06 ENCOUNTER — Encounter: Payer: Self-pay | Admitting: Hematology and Oncology

## 2021-05-12 ENCOUNTER — Encounter (INDEPENDENT_AMBULATORY_CARE_PROVIDER_SITE_OTHER): Payer: Medicare Other | Admitting: Ophthalmology

## 2021-05-12 ENCOUNTER — Other Ambulatory Visit: Payer: Self-pay

## 2021-05-12 DIAGNOSIS — E113512 Type 2 diabetes mellitus with proliferative diabetic retinopathy with macular edema, left eye: Secondary | ICD-10-CM | POA: Diagnosis not present

## 2021-05-12 DIAGNOSIS — I10 Essential (primary) hypertension: Secondary | ICD-10-CM

## 2021-05-12 DIAGNOSIS — E113591 Type 2 diabetes mellitus with proliferative diabetic retinopathy without macular edema, right eye: Secondary | ICD-10-CM | POA: Diagnosis not present

## 2021-05-12 DIAGNOSIS — H35033 Hypertensive retinopathy, bilateral: Secondary | ICD-10-CM | POA: Diagnosis not present

## 2021-05-12 DIAGNOSIS — H43813 Vitreous degeneration, bilateral: Secondary | ICD-10-CM

## 2021-05-12 DIAGNOSIS — H2703 Aphakia, bilateral: Secondary | ICD-10-CM | POA: Diagnosis not present

## 2021-05-13 ENCOUNTER — Inpatient Hospital Stay: Payer: Medicare Other

## 2021-05-13 ENCOUNTER — Inpatient Hospital Stay: Payer: Medicare Other | Attending: Hematology and Oncology

## 2021-05-13 VITALS — BP 143/61 | HR 57 | Temp 98.0°F | Resp 17

## 2021-05-13 DIAGNOSIS — N1832 Chronic kidney disease, stage 3b: Secondary | ICD-10-CM | POA: Diagnosis not present

## 2021-05-13 DIAGNOSIS — D539 Nutritional anemia, unspecified: Secondary | ICD-10-CM

## 2021-05-13 DIAGNOSIS — D631 Anemia in chronic kidney disease: Secondary | ICD-10-CM | POA: Diagnosis not present

## 2021-05-13 LAB — CBC WITH DIFFERENTIAL/PLATELET
Abs Immature Granulocytes: 0.02 10*3/uL (ref 0.00–0.07)
Basophils Absolute: 0.1 10*3/uL (ref 0.0–0.1)
Basophils Relative: 1 %
Eosinophils Absolute: 0.2 10*3/uL (ref 0.0–0.5)
Eosinophils Relative: 3 %
HCT: 32.7 % — ABNORMAL LOW (ref 36.0–46.0)
Hemoglobin: 10.3 g/dL — ABNORMAL LOW (ref 12.0–15.0)
Immature Granulocytes: 0 %
Lymphocytes Relative: 30 %
Lymphs Abs: 2.6 10*3/uL (ref 0.7–4.0)
MCH: 27.9 pg (ref 26.0–34.0)
MCHC: 31.5 g/dL (ref 30.0–36.0)
MCV: 88.6 fL (ref 80.0–100.0)
Monocytes Absolute: 0.5 10*3/uL (ref 0.1–1.0)
Monocytes Relative: 6 %
Neutro Abs: 5.4 10*3/uL (ref 1.7–7.7)
Neutrophils Relative %: 60 %
Platelets: 203 10*3/uL (ref 150–400)
RBC: 3.69 MIL/uL — ABNORMAL LOW (ref 3.87–5.11)
RDW: 14.3 % (ref 11.5–15.5)
WBC: 8.9 10*3/uL (ref 4.0–10.5)
nRBC: 0 % (ref 0.0–0.2)

## 2021-05-13 MED ORDER — EPOETIN ALFA-EPBX 40000 UNIT/ML IJ SOLN
40000.0000 [IU] | Freq: Once | INTRAMUSCULAR | Status: AC
  Administered 2021-05-13: 40000 [IU] via SUBCUTANEOUS
  Filled 2021-05-13: qty 1

## 2021-05-13 NOTE — Patient Instructions (Signed)
Epoetin Alfa injection °What is this medication? °EPOETIN ALFA (e POE e tin AL fa) helps your body make more red blood cells. This medicine is used to treat anemia caused by chronic kidney disease, cancer chemotherapy, or HIV-therapy. It may also be used before surgery if you have anemia. °This medicine may be used for other purposes; ask your health care provider or pharmacist if you have questions. °COMMON BRAND NAME(S): Epogen, Procrit, Retacrit °What should I tell my care team before I take this medication? °They need to know if you have any of these conditions: °cancer °heart disease °high blood pressure °history of blood clots °history of stroke °low levels of folate, iron, or vitamin B12 in the blood °seizures °an unusual or allergic reaction to erythropoietin, albumin, benzyl alcohol, hamster proteins, other medicines, foods, dyes, or preservatives °pregnant or trying to get pregnant °breast-feeding °How should I use this medication? °This medicine is for injection into a vein or under the skin. It is usually given by a health care professional in a hospital or clinic setting. °If you get this medicine at home, you will be taught how to prepare and give this medicine. Use exactly as directed. Take your medicine at regular intervals. Do not take your medicine more often than directed. °It is important that you put your used needles and syringes in a special sharps container. Do not put them in a trash can. If you do not have a sharps container, call your pharmacist or healthcare provider to get one. °A special MedGuide will be given to you by the pharmacist with each prescription and refill. Be sure to read this information carefully each time. °Talk to your pediatrician regarding the use of this medicine in children. While this drug may be prescribed for selected conditions, precautions do apply. °Overdosage: If you think you have taken too much of this medicine contact a poison control center or emergency  room at once. °NOTE: This medicine is only for you. Do not share this medicine with others. °What if I miss a dose? °If you miss a dose, take it as soon as you can. If it is almost time for your next dose, take only that dose. Do not take double or extra doses. °What may interact with this medication? °Interactions have not been studied. °This list may not describe all possible interactions. Give your health care provider a list of all the medicines, herbs, non-prescription drugs, or dietary supplements you use. Also tell them if you smoke, drink alcohol, or use illegal drugs. Some items may interact with your medicine. °What should I watch for while using this medication? °Your condition will be monitored carefully while you are receiving this medicine. °You may need blood work done while you are taking this medicine. °This medicine may cause a decrease in vitamin B6. You should make sure that you get enough vitamin B6 while you are taking this medicine. Discuss the foods you eat and the vitamins you take with your health care professional. °What side effects may I notice from receiving this medication? °Side effects that you should report to your doctor or health care professional as soon as possible: °allergic reactions like skin rash, itching or hives, swelling of the face, lips, or tongue °seizures °signs and symptoms of a blood clot such as breathing problems; changes in vision; chest pain; severe, sudden headache; pain, swelling, warmth in the leg; trouble speaking; sudden numbness or weakness of the face, arm or leg °signs and symptoms of a stroke like   changes in vision; confusion; trouble speaking or understanding; severe headaches; sudden numbness or weakness of the face, arm or leg; trouble walking; dizziness; loss of balance or coordination °Side effects that usually do not require medical attention (report to your doctor or health care professional if they continue or are  bothersome): °chills °cough °dizziness °fever °headaches °joint pain °muscle cramps °muscle pain °nausea, vomiting °pain, redness, or irritation at site where injected °This list may not describe all possible side effects. Call your doctor for medical advice about side effects. You may report side effects to FDA at 1-800-FDA-1088. °Where should I keep my medication? °Keep out of the reach of children. °Store in a refrigerator between 2 and 8 degrees C (36 and 46 degrees F). Do not freeze or shake. Throw away any unused portion if using a single-dose vial. Multi-dose vials can be kept in the refrigerator for up to 21 days after the initial dose. Throw away unused medicine. °NOTE: This sheet is a summary. It may not cover all possible information. If you have questions about this medicine, talk to your doctor, pharmacist, or health care provider. °© 2022 [employer]vier/Gold Standard (2016-11-29 00:00:00) ° °

## 2021-05-19 ENCOUNTER — Ambulatory Visit (INDEPENDENT_AMBULATORY_CARE_PROVIDER_SITE_OTHER): Payer: Medicare Other | Admitting: Cardiovascular Disease

## 2021-05-19 ENCOUNTER — Encounter: Payer: Self-pay | Admitting: Cardiovascular Disease

## 2021-05-19 ENCOUNTER — Other Ambulatory Visit: Payer: Self-pay

## 2021-05-19 DIAGNOSIS — I739 Peripheral vascular disease, unspecified: Secondary | ICD-10-CM | POA: Diagnosis not present

## 2021-05-19 NOTE — Patient Instructions (Addendum)
Medication Instructions:  Your physician recommends that you continue on your current medications as directed. Please refer to the Current Medication list given to you today.  *If you need a refill on your cardiac medications before your next appointment, please call your pharmacy*   Lab Work: Your physician recommends that you have labs drawn today: BMET and CBC  If you have labs (blood work) drawn today and your tests are completely normal, you will receive your results only by: [place] (if you have MyChart) OR A paper copy in the mail If you have any lab test that is abnormal or we need to change your treatment, we will call you to review the results.   Testing/Procedures: Your physician has requested that you have a lower extremity arterial duplex. This test is an ultrasound of the arteries in the legs. It looks at arterial blood flow in the legs. Allow one hour for Lower Arterial scans. There are no restrictions or special instructions  Your physician has requested that you have an ankle brachial index (ABI). During this test an ultrasound and blood pressure cuff are used to evaluate the arteries that supply the arms and legs with blood. Allow thirty minutes for this exam. There are no restrictions or special instructions. To be done 1 week after your PV angiogram. These will be done at [facility].    Follow-Up: At [facility], you and your health needs are our priority.  As part of our continuing mission to provide you with exceptional heart care, we have created designated Provider Care Teams.  These Care Teams include your primary Cardiologist (physician) and Advanced Practice Providers (APPs -  Physician Assistants and Nurse Practitioners) who all work together to provide you with the care you need, when you need it.  We recommend signing up for the patient portal called "MyChart".  Sign up information is provided on this After Visit Summary.  MyChart is used to  connect with patients for Virtual Visits (Telemedicine).  Patients are able to view lab/test results, encounter notes, upcoming appointments, etc.  Non-urgent messages can be sent to your provider as well.   To learn more about what you can do with MyChart, go to [email or web address].    Your next appointment:   2-3 week(s)  The format for your next appointment:   In Person  Provider:   Quay Burow, MD   Other Instructions  [place] [address] Dept: [phone] Loc: [facility]  05/19/2021  You are scheduled for a Peripheral Angiogram on Monday, February 20 with Dr. Quay Burow.  1. Please arrive at the [facility] (Main Entrance A) at [facility]: [address] [place], [place] [address] at 5:30 AM (This time is two hours before your procedure to ensure your preparation). Free valet parking service is available.   Special note: Every effort is made to have your procedure done on time. Please understand that emergencies sometimes delay scheduled procedures.  2. Diet: Do not eat solid foods after midnight.  The patient may have clear liquids until 5am upon the day of the procedure.  3. Labs: You will need to have blood drawn today.  4. Medication instructions in preparation for your procedure:   Take only 7 units of insulin the night before your procedure. Do not take any insulin on the day of the procedure.  Do not take Diabetes Med Glipizide  on the day of the procedure and HOLD [place].  On the morning of your procedure, take your Aspirin and Plavix/Clopidogrel and any morning medicines NOT listed above.  You may use sips of water.  5. Plan for one night stay--bring personal belongings. 6. Bring a current list of your medications and current insurance cards. 7. You MUST have a responsible person to drive  you home. 8. Someone MUST be with you the first 24 hours after you arrive home or your discharge will be delayed. 9. Please wear clothes that are easy to get on and off and wear slip-on shoes.  Thank you for allowing [place] to care for you!   --  Invasive Cardiovascular services

## 2021-05-19 NOTE — Progress Notes (Signed)
05/19/2021 Cleota L. Ruggiero   [birth year]  [id number]  Primary Physician Caren Macadam, MD Primary Cardiologist: Lorretta Harp MD FACP, [place], [place], [place]  HPI:  Trinidad Curet. Schumpert is a 76 y.o.  mildly overweight married African-American female mother of 67 living daughter (son committed suicide), grandmother to 6 grandchildren referred by Dr. Tamala Julian, her cardiologist, for peripheral vascular evaluation.  I last saw her in the office 02/29/2020.  She is accompanied by her daughter [name] today.  In her younger years she worked for the [employer] and then for [employer].  Her risk factors include treated hypertension, diabetes and hyperlipidemia.  She is never had a heart attack or stroke.  She has had a diagnostic cath performed 04/17/2018 that showed some mild CAD.  She had COVID-19 back in January of this year recovered.  She does complain of lifestyle limiting claudication with Dopplers that were performed 01/29/2020 revealing a right ABI of 1.09 and a left of 0.89 with high-frequency signals in both SFAs.  I performed peripheral angiography on her 03/23/2020 revealing 70% segmental mid left SFA stenosis.  I performed [facility] 1 directional atherectomy followed by drug-coated balloon angioplasty with excellent angiographic and clinical result.  Her most recent Doppler studies performed 12/16/2020 revealed a right ABI of 0.97 and a left of 1.01 with a widely patent left SFA.  She wishes to proceed with right SFA intervention.   Current Meds  Medication Sig   acetaminophen (TYLENOL) 500 MG tablet Take 1,000 mg by mouth every 6 (six) hours as needed (for pain.).   amLODipine (NORVASC) 5 MG tablet TAKE 1 TABLET(5 MG) BY MOUTH DAILY   aspirin 81 MG chewable tablet Chew 81 mg by mouth daily.   Blood Glucose Monitoring Suppl (ONETOUCH VERIO) w/Device KIT 1 each by Does not apply route daily.   clopidogrel (PLAVIX) 75 MG tablet TAKE 1 TABLET(75 MG) BY MOUTH DAILY   dapagliflozin  propanediol (FARXIGA) 10 MG TABS tablet Take 10 mg by mouth daily.   famotidine (PEPCID) 20 MG tablet TAKE 1 TABLET(20 MG) BY MOUTH TWICE DAILY AS NEEDED FOR HEARTBURN OR INDIGESTION   glipiZIDE (GLUCOTROL) 5 MG tablet Take 5 mg by mouth daily before breakfast.   glucose blood (ONETOUCH VERIO) test strip USE AS DIRECTED TO TEST BLOOD GLUCOSE DAILY   insulin glargine (LANTUS SOLOSTAR) 100 UNIT/ML Solostar Pen Inject 15 Units into the skin daily at 10 pm.   Insulin Pen Needle 31G X 5 MM MISC Check blood sugar once daily   isosorbide mononitrate (IMDUR) 60 MG 24 hr tablet Take 1 tablet (60 mg total) by mouth daily. TAKE 1 TABLET(60 MG) BY MOUTH DAILY Strength: 60 mg   Lancet Devices (ONE TOUCH DELICA LANCING DEV) MISC Check blood sugar once daily   Lancets MISC Use as directed to test blood glucose daily   nitroGLYCERIN (NITROSTAT) 0.4 MG SL tablet Place 1 tablet (0.4 mg total) under the tongue every 5 (five) minutes as needed for chest pain.   polyethylene glycol powder (GLYCOLAX/MIRALAX) 17 GM/SCOOP powder Take 17 g by mouth daily. (Patient taking differently: Take 17 g by mouth daily. Pt was taking 3 x weekly however will start taking daily due to ongoing constipation.)   quinapril-hydrochlorothiazide (ACCURETIC) 20-12.5 MG tablet TAKE 1 TABLET BY MOUTH DAILY   rosuvastatin (CRESTOR) 20 MG tablet TAKE 1 TABLET(20 MG) BY MOUTH DAILY     Allergies  Allergen Reactions   Metformin And Related Diarrhea    Social History  Socioeconomic History   Marital status: Legally Separated    Spouse name: Not on file   Number of children: 2   Years of education: Not on file   Highest education level: Not on file  Occupational History   Not on file  Tobacco Use   Smoking status: Never   Smokeless tobacco: Never  Vaping Use   Vaping Use: Never used  Substance and Sexual Activity   Alcohol use: No   Drug use: No   Sexual activity: Not on file  Other Topics Concern   Not on file  Social History  Narrative   Not on file   Social Determinants of Health   Financial Resource Strain: Not on file  Food Insecurity: No Food Insecurity   Worried About Running Out of Food in the Last Year: Never true   [place] in the Last Year: Never true  Transportation Needs: No Transportation Needs   Lack of Transportation (Medical): No   Lack of Transportation (Non-Medical): No  Physical Activity: Not on file  Stress: Not on file  Social Connections: Not on file  Intimate Partner Violence: Not on file     Review of Systems: General: negative for chills, fever, night sweats or weight changes.  Cardiovascular: negative for chest pain, dyspnea on exertion, edema, orthopnea, palpitations, paroxysmal nocturnal dyspnea or shortness of breath Dermatological: negative for rash Respiratory: negative for cough or wheezing Urologic: negative for hematuria Abdominal: negative for nausea, vomiting, diarrhea, bright red blood per rectum, melena, or hematemesis Neurologic: negative for visual changes, syncope, or dizziness All other systems reviewed and are otherwise negative except as noted above.    Blood pressure (!) 116/58, pulse (!) 55, height _0  (1.626 m), weight 132 lb 9.6 oz (60.1 kg), SpO2 100 %.  General appearance: alert and no distress Neck: no adenopathy, no carotid bruit, no JVD, supple, symmetrical, trachea midline, and thyroid not enlarged, symmetric, no tenderness/mass/nodules Lungs: clear to auscultation bilaterally Heart: regular rate and rhythm, S1, S2 normal, no murmur, click, rub or gallop Extremities: extremities normal, atraumatic, no cyanosis or edema Pulses: 2+ and symmetric Skin: Skin color, texture, turgor normal. No rashes or lesions Neurologic: Grossly normal  EKG sinus bradycardia at 55 without ST or T wave changes.  I personally reviewed this EKG.  ASSESSMENT AND PLAN:   Claudication in peripheral vascular disease ([place]) Ms. Whidby returns today for  follow-up of her PAD.  She was initially referred to me by Dr. Tamala Julian, her cardiologist for symptomatic PAD.  She had Dopplers performed that showed bilateral SFA disease on 01/29/2020.  I performed angiography on her on 03/23/2020 and performed [facility] 1 directional atherectomy followed by drug-coated balloon after angioplasty of a 70% segmental mid left SFA stenosis with an excellent result.  Her claudication on that side resolved.  Her most recent Dopplers performed 12/16/2020 revealed a right ABI of 0.97 and left of 1.01 with a patent left SFA.  She wishes to proceed with right SFA intervention for lifestyle limiting claudication.     Lorretta Harp MD FACP,FACC,FAHA, [facility] 05/19/2021 2:34 PM

## 2021-05-19 NOTE — Assessment & Plan Note (Signed)
[name] returns today for follow-up of her PAD.  She was initially referred to me by Dr. Tamala Julian, her cardiologist for symptomatic PAD.  She had Dopplers performed that showed bilateral SFA disease on 01/29/2020.  I performed angiography on her on 03/23/2020 and performed [facility] 1 directional atherectomy followed by drug-coated balloon after angioplasty of a 70% segmental mid left SFA stenosis with an excellent result.  Her claudication on that side resolved.  Her most recent Dopplers performed 12/16/2020 revealed a right ABI of 0.97 and left of 1.01 with a patent left SFA.  She wishes to proceed with right SFA intervention for lifestyle limiting claudication.

## 2021-05-21 ENCOUNTER — Telehealth: Payer: Self-pay

## 2021-05-21 ENCOUNTER — Other Ambulatory Visit: Payer: Self-pay

## 2021-05-21 DIAGNOSIS — I739 Peripheral vascular disease, unspecified: Secondary | ICD-10-CM

## 2021-05-21 MED ORDER — SODIUM CHLORIDE 0.9% FLUSH: 3.0000 mL | Freq: Two times a day (BID) | INTRAVENOUS | Status: DC

## 2021-05-21 NOTE — Telephone Encounter (Signed)
Spoke with pt regarding PV angiogram. Informed pt that she will need pre-hydration before procedure due to her kidney function per Dr. [name]. Pt will still arrive to the hospital at 5:30am but she is now aware that her procedure will start closer to 9:30am. Pt verbalizes understanding.

## 2021-05-22 LAB — CBC
Hematocrit: 30.5 % — ABNORMAL LOW (ref 34.0–46.6)
Hemoglobin: 9.8 g/dL — ABNORMAL LOW (ref 11.1–15.9)
MCH: 28.3 pg (ref 26.6–33.0)
MCHC: 32.1 g/dL (ref 31.5–35.7)
MCV: 88 fL (ref 79–97)
Platelets: 235 x10E3/uL (ref 150–450)
RBC: 3.46 x10E6/uL — ABNORMAL LOW (ref 3.77–5.28)
RDW: 14.5 % (ref 11.7–15.4)
WBC: 8.7 x10E3/uL (ref 3.4–10.8)

## 2021-05-22 LAB — BASIC METABOLIC PANEL
BUN/Creatinine Ratio: 20 (ref 12–28)
BUN: 37 mg/dL — ABNORMAL HIGH (ref 8–27)
CO2: 21 mmol/L (ref 20–29)
Calcium: 9.8 mg/dL (ref 8.7–10.3)
Chloride: 108 mmol/L — ABNORMAL HIGH (ref 96–106)
Creatinine, Ser: 1.87 mg/dL — ABNORMAL HIGH (ref 0.57–1.00)
Glucose: 161 mg/dL — ABNORMAL HIGH (ref 70–99)
Potassium: 3.8 mmol/L (ref 3.5–5.2)
Sodium: 149 mmol/L — ABNORMAL HIGH (ref 134–144)
eGFR: 28 mL/min/{1.73_m2} — ABNORMAL LOW (ref >59)

## 2021-05-26 ENCOUNTER — Other Ambulatory Visit: Payer: Self-pay | Admitting: *Deleted

## 2021-05-26 NOTE — Patient Outreach (Signed)
[facility]) Care Management  05/26/2021  [place] L. Puccini 12-08-1945 [id number]  Telephone Assessment -Requested a call back  RN attempted outreach call today however pt at an appointment and grand-daughter has requested RN to call back tomorrow afternoon.  Will follow up once again tomorrow as requested.  Raina Mina, RN Care Management Coordinator [place] Office [phone]

## 2021-05-27 ENCOUNTER — Telehealth: Payer: Self-pay | Admitting: *Deleted

## 2021-05-27 ENCOUNTER — Encounter: Payer: Self-pay | Admitting: *Deleted

## 2021-05-27 ENCOUNTER — Other Ambulatory Visit: Payer: Self-pay | Admitting: *Deleted

## 2021-05-27 DIAGNOSIS — R42 Dizziness and giddiness: Secondary | ICD-10-CM | POA: Diagnosis not present

## 2021-05-27 DIAGNOSIS — I25118 Atherosclerotic heart disease of native coronary artery with other forms of angina pectoris: Secondary | ICD-10-CM | POA: Diagnosis not present

## 2021-05-27 DIAGNOSIS — E7800 Pure hypercholesterolemia, unspecified: Secondary | ICD-10-CM | POA: Diagnosis not present

## 2021-05-27 DIAGNOSIS — H9313 Tinnitus, bilateral: Secondary | ICD-10-CM | POA: Diagnosis not present

## 2021-05-27 DIAGNOSIS — E118 Type 2 diabetes mellitus with unspecified complications: Secondary | ICD-10-CM | POA: Diagnosis not present

## 2021-05-27 DIAGNOSIS — I739 Peripheral vascular disease, unspecified: Secondary | ICD-10-CM | POA: Diagnosis not present

## 2021-05-27 DIAGNOSIS — I10 Essential (primary) hypertension: Secondary | ICD-10-CM | POA: Diagnosis not present

## 2021-05-27 DIAGNOSIS — K219 Gastro-esophageal reflux disease without esophagitis: Secondary | ICD-10-CM | POA: Diagnosis not present

## 2021-05-27 NOTE — Patient Outreach (Signed)
[name]) Care Management  05/27/2021  [name] March 14, 1946 [id number]  Telephone Assessment-Unsuccessful   RN attempted outreach however unsuccessful. RN unable to leave message. Will attempt another outreach over the next week and send outreach letter.  Raina Mina, RN Care Management Coordinator [place] Office [phone]

## 2021-05-27 NOTE — Telephone Encounter (Signed)
Abdominal aortogram scheduled at [facility] for: Monday May 31, 2021 9:30 AM [facility] Main Entrance A [facility]) at: 5:30 AM-pre-procedure hydration   Diet-no solid food after midnight prior to cath, clear liquids until 5 AM day of procedure.  Medication instructions for procedure: -Hold:  Accupril-day before and day of procedure-per protocol GFR 28  Glipizide-AM of procedure  Farxiga-AM of procedure -Except hold medications usual morning medications can be taken pre-cath with sips of water including aspirin 81 mg and Plavix 75 mg    Must have responsible adult to drive home post procedure and be with patient first 24 hours after arriving home.  [facility] does allow one visitor to wait in the waiting room during the time you are there.   Patient reports does not currently have any new symptoms concerning for COVID-19 and no household members with COVID-19 like illness.            Reviewed procedure instructions with patient.

## 2021-05-31 ENCOUNTER — Inpatient Hospital Stay (HOSPITAL_COMMUNITY)
Admission: AD | Admit: 2021-05-31 | Discharge: 2021-06-02 | DRG: 271 | Disposition: A | Discharge status: Home / Self Care | Payer: Medicare Other | Source: Ambulatory Visit | Attending: Cardiovascular Disease | Admitting: Cardiovascular Disease

## 2021-05-31 ENCOUNTER — Encounter (HOSPITAL_COMMUNITY)
Admission: AD | Disposition: A | Discharge status: Home / Self Care | Payer: Self-pay | Source: Ambulatory Visit | Attending: Cardiovascular Disease

## 2021-05-31 ENCOUNTER — Other Ambulatory Visit: Payer: Self-pay

## 2021-05-31 DIAGNOSIS — E1151 Type 2 diabetes mellitus with diabetic peripheral angiopathy without gangrene: Principal | ICD-10-CM | POA: Diagnosis present

## 2021-05-31 DIAGNOSIS — Z85038 Personal history of other malignant neoplasm of large intestine: Secondary | ICD-10-CM | POA: Diagnosis not present

## 2021-05-31 DIAGNOSIS — I97638 Postprocedural hematoma of a circulatory system organ or structure following other circulatory system procedure: Secondary | ICD-10-CM | POA: Diagnosis not present

## 2021-05-31 DIAGNOSIS — Z20822 Contact with and (suspected) exposure to covid-19: Secondary | ICD-10-CM | POA: Diagnosis not present

## 2021-05-31 DIAGNOSIS — N184 Chronic kidney disease, stage 4 (severe): Secondary | ICD-10-CM | POA: Diagnosis not present

## 2021-05-31 DIAGNOSIS — I129 Hypertensive chronic kidney disease with stage 1 through stage 4 chronic kidney disease, or unspecified chronic kidney disease: Secondary | ICD-10-CM | POA: Diagnosis not present

## 2021-05-31 DIAGNOSIS — Z7982 Long term (current) use of aspirin: Secondary | ICD-10-CM | POA: Diagnosis not present

## 2021-05-31 DIAGNOSIS — D62 Acute posthemorrhagic anemia: Secondary | ICD-10-CM | POA: Diagnosis not present

## 2021-05-31 DIAGNOSIS — E1122 Type 2 diabetes mellitus with diabetic chronic kidney disease: Secondary | ICD-10-CM | POA: Diagnosis not present

## 2021-05-31 DIAGNOSIS — Z7984 Long term (current) use of oral hypoglycemic drugs: Secondary | ICD-10-CM | POA: Diagnosis not present

## 2021-05-31 DIAGNOSIS — Y838 Other surgical procedures as the cause of abnormal reaction of the patient, or of later complication, without mention of misadventure at the time of the procedure: Secondary | ICD-10-CM | POA: Diagnosis not present

## 2021-05-31 DIAGNOSIS — E785 Hyperlipidemia, unspecified: Secondary | ICD-10-CM | POA: Diagnosis present

## 2021-05-31 DIAGNOSIS — I2510 Atherosclerotic heart disease of native coronary artery without angina pectoris: Secondary | ICD-10-CM | POA: Diagnosis not present

## 2021-05-31 DIAGNOSIS — E1169 Type 2 diabetes mellitus with other specified complication: Secondary | ICD-10-CM | POA: Diagnosis present

## 2021-05-31 DIAGNOSIS — Z791 Long term (current) use of non-steroidal anti-inflammatories (NSAID): Secondary | ICD-10-CM | POA: Diagnosis not present

## 2021-05-31 DIAGNOSIS — I70211 Atherosclerosis of native arteries of extremities with intermittent claudication, right leg: Secondary | ICD-10-CM | POA: Diagnosis not present

## 2021-05-31 DIAGNOSIS — E1159 Type 2 diabetes mellitus with other circulatory complications: Secondary | ICD-10-CM | POA: Diagnosis present

## 2021-05-31 DIAGNOSIS — Z888 Allergy status to other drugs, medicaments and biological substances status: Secondary | ICD-10-CM | POA: Diagnosis not present

## 2021-05-31 DIAGNOSIS — Z79899 Other long term (current) drug therapy: Secondary | ICD-10-CM

## 2021-05-31 DIAGNOSIS — I739 Peripheral vascular disease, unspecified: Secondary | ICD-10-CM | POA: Diagnosis not present

## 2021-05-31 DIAGNOSIS — Z8616 Personal history of COVID-19: Secondary | ICD-10-CM | POA: Diagnosis not present

## 2021-05-31 DIAGNOSIS — Z9861 Coronary angioplasty status: Secondary | ICD-10-CM

## 2021-05-31 DIAGNOSIS — Z794 Long term (current) use of insulin: Secondary | ICD-10-CM | POA: Diagnosis not present

## 2021-05-31 HISTORY — PX: PERIPHERAL VASCULAR ATHERECTOMY: CATH118256

## 2021-05-31 HISTORY — PX: LOWER EXTREMITY ANGIOGRAPHY: CATH118251

## 2021-05-31 LAB — POCT ACTIVATED CLOTTING TIME
Activated Clotting Time: 221 seconds
Activated Clotting Time: 323 seconds
Activated Clotting Time: 245 seconds
Activated Clotting Time: 209 seconds

## 2021-05-31 LAB — GLUCOSE, CAPILLARY
Glucose-Capillary: 144 mg/dL — ABNORMAL HIGH (ref 70–99)
Glucose-Capillary: 76 mg/dL (ref 70–99)

## 2021-05-31 LAB — SARS CORONAVIRUS 2 BY RT PCR (HOSPITAL ORDER, PERFORMED IN ~~LOC~~ HOSPITAL LAB): SARS Coronavirus 2: NEGATIVE

## 2021-05-31 SURGERY — LOWER EXTREMITY ANGIOGRAPHY
Anesthesia: LOCAL

## 2021-05-31 MED ORDER — SODIUM CHLORIDE 0.9% FLUSH: 3.0000 mL | INTRAVENOUS | Status: DC | PRN

## 2021-05-31 MED ORDER — SODIUM CHLORIDE 0.9 % IV SOLN: 250.0000 mL | INTRAVENOUS | Status: DC | PRN

## 2021-05-31 MED ORDER — IODIXANOL 320 MG/ML IV SOLN
INTRAVENOUS | Status: DC | PRN
  Administered 2021-05-31: 125 mL

## 2021-05-31 MED ORDER — LIDOCAINE HCL (PF) 1 % IJ SOLN
INTRAMUSCULAR | Status: AC
  Filled 2021-05-31: qty 30

## 2021-05-31 MED ORDER — HEPARIN SODIUM (PORCINE) 1000 UNIT/ML IJ SOLN
INTRAMUSCULAR | Status: AC
  Filled 2021-05-31: qty 10

## 2021-05-31 MED ORDER — FENTANYL CITRATE (PF) 100 MCG/2ML IJ SOLN
INTRAMUSCULAR | Status: DC | PRN
  Administered 2021-05-31: 25 ug via INTRAVENOUS

## 2021-05-31 MED ORDER — FENTANYL CITRATE (PF) 100 MCG/2ML IJ SOLN
INTRAMUSCULAR | Status: AC
  Filled 2021-05-31: qty 2

## 2021-05-31 MED ORDER — ASPIRIN EC 81 MG PO TBEC
81.0000 mg | DELAYED_RELEASE_TABLET | Freq: Every day | ORAL | Status: DC
  Administered 2021-06-01 – 2021-06-02 (×2): 81 mg via ORAL
  Filled 2021-05-31 (×2): qty 1

## 2021-05-31 MED ORDER — HEPARIN SODIUM (PORCINE) 1000 UNIT/ML IJ SOLN
INTRAMUSCULAR | Status: DC | PRN
  Administered 2021-05-31: 4000 [IU] via INTRAVENOUS
  Administered 2021-05-31: 6500 [IU] via INTRAVENOUS

## 2021-05-31 MED ORDER — LISINOPRIL 20 MG PO TABS
20.0000 mg | ORAL_TABLET | Freq: Every day | ORAL | Status: DC
Start: 2021-05-31 — End: 2021-06-02
  Administered 2021-05-31 – 2021-06-02 (×3): 20 mg via ORAL
  Filled 2021-05-31 (×3): qty 1

## 2021-05-31 MED ORDER — MORPHINE SULFATE (PF) 2 MG/ML IV SOLN: 2.0000 mg | INTRAVENOUS | Status: DC | PRN

## 2021-05-31 MED ORDER — SODIUM CHLORIDE 0.9 % WEIGHT BASED INFUSION
3.0000 mL/kg/h | INTRAVENOUS | Status: DC
  Administered 2021-05-31: 3 mL/kg/h via INTRAVENOUS

## 2021-05-31 MED ORDER — ONDANSETRON HCL 4 MG/2ML IJ SOLN
INTRAMUSCULAR | Status: AC
  Filled 2021-05-31: qty 2

## 2021-05-31 MED ORDER — ROSUVASTATIN CALCIUM 20 MG PO TABS
20.0000 mg | ORAL_TABLET | Freq: Every day | ORAL | Status: DC
  Administered 2021-05-31 – 2021-06-02 (×3): 20 mg via ORAL
  Filled 2021-05-31 (×3): qty 1

## 2021-05-31 MED ORDER — MIDAZOLAM HCL 2 MG/2ML IJ SOLN
INTRAMUSCULAR | Status: AC
  Filled 2021-05-31: qty 2

## 2021-05-31 MED ORDER — ASPIRIN 81 MG PO CHEW: 81.0000 mg | CHEWABLE_TABLET | Freq: Every day | ORAL | Status: DC

## 2021-05-31 MED ORDER — SODIUM CHLORIDE 0.9 % WEIGHT BASED INFUSION
1.0000 mL/kg/h | INTRAVENOUS | Status: DC
  Administered 2021-05-31: 1 mL/kg/h via INTRAVENOUS

## 2021-05-31 MED ORDER — HYDROCHLOROTHIAZIDE 12.5 MG PO TABS
12.5000 mg | ORAL_TABLET | Freq: Every day | ORAL | Status: DC
  Administered 2021-05-31 – 2021-06-02 (×3): 12.5 mg via ORAL
  Filled 2021-05-31 (×3): qty 1

## 2021-05-31 MED ORDER — HEPARIN (PORCINE) IN NACL 1000-0.9 UT/500ML-% IV SOLN
INTRAVENOUS | Status: AC
  Filled 2021-05-31: qty 1000

## 2021-05-31 MED ORDER — CLOPIDOGREL BISULFATE 75 MG PO TABS
75.0000 mg | ORAL_TABLET | Freq: Every day | ORAL | Status: DC
  Administered 2021-06-01 – 2021-06-02 (×2): 75 mg via ORAL
  Filled 2021-05-31 (×2): qty 1

## 2021-05-31 MED ORDER — LIDOCAINE HCL (PF) 1 % IJ SOLN
INTRAMUSCULAR | Status: DC | PRN
  Administered 2021-05-31: 20 mL

## 2021-05-31 MED ORDER — HEPARIN (PORCINE) IN NACL 1000-0.9 UT/500ML-% IV SOLN
INTRAVENOUS | Status: DC | PRN
  Administered 2021-05-31 (×2): 500 mL

## 2021-05-31 MED ORDER — ATROPINE SULFATE 1 MG/10ML IJ SOSY
0.5000 mg | PREFILLED_SYRINGE | Freq: Once | INTRAMUSCULAR | Status: AC
  Administered 2021-05-31: 0.5 mg via INTRAVENOUS

## 2021-05-31 MED ORDER — SODIUM CHLORIDE 0.9% FLUSH
3.0000 mL | Freq: Two times a day (BID) | INTRAVENOUS | Status: DC
  Administered 2021-06-01 – 2021-06-02 (×3): 3 mL via INTRAVENOUS

## 2021-05-31 MED ORDER — ASPIRIN 81 MG PO CHEW: 81.0000 mg | CHEWABLE_TABLET | ORAL | Status: DC

## 2021-05-31 MED ORDER — ATROPINE SULFATE 1 MG/10ML IJ SOSY
PREFILLED_SYRINGE | INTRAMUSCULAR | Status: AC
  Filled 2021-05-31: qty 10

## 2021-05-31 MED ORDER — QUINAPRIL-HYDROCHLOROTHIAZIDE 20-12.5 MG PO TABS: 1.0000 | ORAL_TABLET | Freq: Every day | ORAL | Status: DC

## 2021-05-31 MED ORDER — MIDAZOLAM HCL 2 MG/2ML IJ SOLN
INTRAMUSCULAR | Status: DC | PRN
  Administered 2021-05-31: 1 mg via INTRAVENOUS

## 2021-05-31 MED ORDER — SODIUM CHLORIDE 0.9 % IV SOLN: INTRAVENOUS | Status: AC

## 2021-05-31 MED ORDER — CLOPIDOGREL BISULFATE 75 MG PO TABS: 75.0000 mg | ORAL_TABLET | Freq: Every day | ORAL | Status: DC

## 2021-05-31 MED ORDER — ACETAMINOPHEN 325 MG PO TABS
650.0000 mg | ORAL_TABLET | ORAL | Status: DC | PRN
  Administered 2021-06-01 (×2): 650 mg via ORAL
  Filled 2021-05-31 (×2): qty 2

## 2021-05-31 MED ORDER — AMLODIPINE BESYLATE 5 MG PO TABS
5.0000 mg | ORAL_TABLET | Freq: Every day | ORAL | Status: DC
  Administered 2021-05-31 – 2021-06-02 (×3): 5 mg via ORAL
  Filled 2021-05-31 (×3): qty 1

## 2021-05-31 MED ORDER — GLIPIZIDE 5 MG PO TABS
5.0000 mg | ORAL_TABLET | Freq: Every day | ORAL | Status: DC
  Administered 2021-06-01 – 2021-06-02 (×2): 5 mg via ORAL
  Filled 2021-05-31 (×2): qty 1

## 2021-05-31 MED ORDER — LABETALOL HCL 5 MG/ML IV SOLN: 10.0000 mg | INTRAVENOUS | Status: DC | PRN

## 2021-05-31 MED ORDER — HYDRALAZINE HCL 20 MG/ML IJ SOLN: 5.0000 mg | INTRAMUSCULAR | Status: DC | PRN

## 2021-05-31 MED ORDER — ONDANSETRON HCL 4 MG/2ML IJ SOLN
4.0000 mg | Freq: Four times a day (QID) | INTRAMUSCULAR | Status: DC | PRN
  Administered 2021-05-31: 4 mg via INTRAVENOUS

## 2021-05-31 MED ORDER — ISOSORBIDE MONONITRATE ER 60 MG PO TB24
60.0000 mg | ORAL_TABLET | Freq: Every day | ORAL | Status: DC
Start: 2021-05-31 — End: 2021-06-01
  Administered 2021-05-31 – 2021-06-01 (×2): 60 mg via ORAL
  Filled 2021-05-31 (×2): qty 1

## 2021-05-31 SURGICAL SUPPLY — 21 items
BAG SNAP BAND KOVER 36X36 (MISCELLANEOUS) ×2 IMPLANT
BALLN IN.PACT DCB 5X60 (BALLOONS) ×3
CATH ANGIO 5F PIGTAIL 65CM (CATHETERS) ×2 IMPLANT
CATH CROSS OVER TEMPO 5F (CATHETERS) ×2 IMPLANT
CATH HAWKONE LS STANDARD TIP (CATHETERS) ×3
CATH HAWKONE LS STD TIP (CATHETERS) ×1 IMPLANT
CATH STRAIGHT 5FR 65CM (CATHETERS) ×2 IMPLANT
DCB IN.PACT 5X60 (BALLOONS) ×1 IMPLANT
DEVICE CONTINUOUS FLUSH (MISCELLANEOUS) ×2 IMPLANT
DEVICE SPIDERFX EMB PROT 5MM (WIRE) ×2 IMPLANT
GUIDEWIRE ZILIENT 6G 014 (WIRE) ×2 IMPLANT
KIT ENCORE 26 ADVANTAGE (KITS) ×2 IMPLANT
KIT PV (KITS) ×3 IMPLANT
SHEATH HIGHFLEX ANSEL 7FR 55CM (SHEATH) ×2 IMPLANT
SHEATH PINNACLE 5F 10CM (SHEATH) ×2 IMPLANT
SHEATH PINNACLE 7F 10CM (SHEATH) ×2 IMPLANT
SYR MEDRAD MARK 7 150ML (SYRINGE) ×3 IMPLANT
TAPE SHOOT N SEE (TAPE) ×2 IMPLANT
TRANSDUCER W/STOPCOCK (MISCELLANEOUS) ×3 IMPLANT
TRAY PV CATH (CUSTOM PROCEDURE TRAY) ×3 IMPLANT
WIRE HITORQ VERSACORE ST 145CM (WIRE) ×2 IMPLANT

## 2021-05-31 NOTE — Progress Notes (Addendum)
Sheath pulled. Atropine 0.5mg  IV push given, HR and BP low, nauseated, warm and dry. Yawning. Head of bed down. Coherant. IVF wide open at 999 for 250cc 0.9NS. Medicated for nausea.

## 2021-05-31 NOTE — Progress Notes (Signed)
Dr. [name] made aware of left groin hematoma

## 2021-05-31 NOTE — Progress Notes (Signed)
Pt arrived from Cath lab, VSS, CHG complete, tele started, oriented to unit, level 1 hematoma present.   [name], RN 05/31/2021 2:48 PM

## 2021-05-31 NOTE — Progress Notes (Signed)
87fr sheath present in left femoral artery.  Large area of hematoma present above and around sheath site. Hematoma had been expressed previously by Eddie Dibbles R.N. ACT 209. Due to oozing and hematoma, Dr. [name] agreed with me removing the sheath.  75fr sheath aspirated and removed from left femoral artery. Manual pressure applied for 35 minutes. Site level 1, slight hematoma present lateral to sheath site. Approximately 5cm in diameter.   Bilateral dp and pt pulses palpable. Tegaderm dressing applied. Bedrest instructions given.   Bedrest begins at 14:00:00

## 2021-05-31 NOTE — Progress Notes (Signed)
Hematoma left groin larger, above insertion site, medial and lateral to sheath. Manual pressure held 10 minutes, expressed till soft. ACT 209; Dr. [name] notified; permission received to pull sheath now.

## 2021-05-31 NOTE — Progress Notes (Signed)
Left groin dressing saturated; level 1 hematoma mostly above insertion site. Manual pressure held x 10 minutes; expressed small amt blood and hematoma softened. Puffy now w/o ridge nor firmness. Redressed w/gauze and tegaderm

## 2021-05-31 NOTE — Progress Notes (Signed)
Patient groin has been oozing, hematoma has softened since arrival, MD notified and requested to call cath lab to come and apply pressure.   [name], RN 05/31/2021 4:37 PM

## 2021-06-01 ENCOUNTER — Encounter (HOSPITAL_COMMUNITY): Payer: Self-pay | Admitting: Cardiovascular Disease

## 2021-06-01 DIAGNOSIS — I739 Peripheral vascular disease, unspecified: Secondary | ICD-10-CM

## 2021-06-01 DIAGNOSIS — D62 Acute posthemorrhagic anemia: Secondary | ICD-10-CM | POA: Diagnosis not present

## 2021-06-01 DIAGNOSIS — Z794 Long term (current) use of insulin: Secondary | ICD-10-CM | POA: Diagnosis not present

## 2021-06-01 DIAGNOSIS — Y838 Other surgical procedures as the cause of abnormal reaction of the patient, or of later complication, without mention of misadventure at the time of the procedure: Secondary | ICD-10-CM | POA: Diagnosis not present

## 2021-06-01 DIAGNOSIS — Z8616 Personal history of COVID-19: Secondary | ICD-10-CM | POA: Diagnosis not present

## 2021-06-01 DIAGNOSIS — I70211 Atherosclerosis of native arteries of extremities with intermittent claudication, right leg: Secondary | ICD-10-CM | POA: Diagnosis present

## 2021-06-01 DIAGNOSIS — N184 Chronic kidney disease, stage 4 (severe): Secondary | ICD-10-CM | POA: Diagnosis present

## 2021-06-01 DIAGNOSIS — I97638 Postprocedural hematoma of a circulatory system organ or structure following other circulatory system procedure: Secondary | ICD-10-CM | POA: Diagnosis not present

## 2021-06-01 DIAGNOSIS — E1151 Type 2 diabetes mellitus with diabetic peripheral angiopathy without gangrene: Secondary | ICD-10-CM | POA: Diagnosis present

## 2021-06-01 DIAGNOSIS — Z9861 Coronary angioplasty status: Secondary | ICD-10-CM | POA: Diagnosis not present

## 2021-06-01 DIAGNOSIS — Z7984 Long term (current) use of oral hypoglycemic drugs: Secondary | ICD-10-CM | POA: Diagnosis not present

## 2021-06-01 DIAGNOSIS — I2510 Atherosclerotic heart disease of native coronary artery without angina pectoris: Secondary | ICD-10-CM | POA: Diagnosis present

## 2021-06-01 DIAGNOSIS — I129 Hypertensive chronic kidney disease with stage 1 through stage 4 chronic kidney disease, or unspecified chronic kidney disease: Secondary | ICD-10-CM | POA: Diagnosis present

## 2021-06-01 DIAGNOSIS — Z20822 Contact with and (suspected) exposure to covid-19: Secondary | ICD-10-CM | POA: Diagnosis present

## 2021-06-01 DIAGNOSIS — Z79899 Other long term (current) drug therapy: Secondary | ICD-10-CM | POA: Diagnosis not present

## 2021-06-01 DIAGNOSIS — E1122 Type 2 diabetes mellitus with diabetic chronic kidney disease: Secondary | ICD-10-CM | POA: Diagnosis present

## 2021-06-01 DIAGNOSIS — Z85038 Personal history of other malignant neoplasm of large intestine: Secondary | ICD-10-CM | POA: Diagnosis not present

## 2021-06-01 DIAGNOSIS — E785 Hyperlipidemia, unspecified: Secondary | ICD-10-CM | POA: Diagnosis present

## 2021-06-01 DIAGNOSIS — Z7982 Long term (current) use of aspirin: Secondary | ICD-10-CM | POA: Diagnosis not present

## 2021-06-01 DIAGNOSIS — Z791 Long term (current) use of non-steroidal anti-inflammatories (NSAID): Secondary | ICD-10-CM | POA: Diagnosis not present

## 2021-06-01 DIAGNOSIS — Z888 Allergy status to other drugs, medicaments and biological substances status: Secondary | ICD-10-CM | POA: Diagnosis not present

## 2021-06-01 LAB — CBC
HCT: 23.2 % — ABNORMAL LOW (ref 36.0–46.0)
HCT: 21.9 % — ABNORMAL LOW (ref 36.0–46.0)
Hemoglobin: 7.2 g/dL — ABNORMAL LOW (ref 12.0–15.0)
Hemoglobin: 6.9 g/dL — CL (ref 12.0–15.0)
MCH: 28.3 pg (ref 26.0–34.0)
MCH: 28.3 pg (ref 26.0–34.0)
MCHC: 31 g/dL (ref 30.0–36.0)
MCHC: 31.5 g/dL (ref 30.0–36.0)
MCV: 91.3 fL (ref 80.0–100.0)
MCV: 89.8 fL (ref 80.0–100.0)
Platelets: 170 10*3/uL (ref 150–400)
Platelets: 160 10*3/uL (ref 150–400)
RBC: 2.54 MIL/uL — ABNORMAL LOW (ref 3.87–5.11)
RBC: 2.44 MIL/uL — ABNORMAL LOW (ref 3.87–5.11)
RDW: 14.5 % (ref 11.5–15.5)
RDW: 14.4 % (ref 11.5–15.5)
WBC: 8.3 10*3/uL (ref 4.0–10.5)
WBC: 9.8 10*3/uL (ref 4.0–10.5)
nRBC: 0 % (ref 0.0–0.2)
nRBC: 0 % (ref 0.0–0.2)

## 2021-06-01 LAB — ABO/RH: ABO/RH(D): A POS

## 2021-06-01 LAB — BASIC METABOLIC PANEL
Anion gap: 10 (ref 5–15)
BUN: 54 mg/dL — ABNORMAL HIGH (ref 8–23)
CO2: 22 mmol/L (ref 22–32)
Calcium: 8.6 mg/dL — ABNORMAL LOW (ref 8.9–10.3)
Chloride: 111 mmol/L (ref 98–111)
Creatinine, Ser: 1.78 mg/dL — ABNORMAL HIGH (ref 0.44–1.00)
GFR, Estimated: 30 mL/min — ABNORMAL LOW (ref >60)
Glucose, Bld: 149 mg/dL — ABNORMAL HIGH (ref 70–99)
Potassium: 3.7 mmol/L (ref 3.5–5.1)
Sodium: 143 mmol/L (ref 135–145)

## 2021-06-01 LAB — PREPARE RBC (CROSSMATCH)

## 2021-06-01 MED ORDER — SODIUM CHLORIDE 0.9% IV SOLUTION
Freq: Once | INTRAVENOUS | Status: AC
  Administered 2021-06-01: 20 mL via INTRAVENOUS

## 2021-06-01 MED ORDER — SODIUM CHLORIDE 0.9 % IV BOLUS
500.0000 mL | Freq: Once | INTRAVENOUS | Status: AC
  Administered 2021-06-01: 500 mL via INTRAVENOUS

## 2021-06-01 NOTE — Discharge Summary (Incomplete)
Discharge Summary    Patient ID: Anne Montee. [name] MRN: [id number]; DOB: Dec 11, 1945  Admit date: 05/31/2021 Discharge date: 06/01/2021  PCP:  Caren Macadam, MD   [facility] Providers Cardiologist:  Sinclair Grooms, MD        Discharge Diagnoses    Principal Problem:   Claudication in peripheral vascular disease [facility])    Diagnostic Studies/Procedures    PV CATH: 05/31/2021 Angiographic Data:    1: Right lower extremity-75% mid right SFA stenosis, occluded anterior tibial artery   IMPRESSION: Ms. Bodi has a significant lesion mid right SFA with claudication.  We will proceed with [facility] 1 directional atherectomy followed by drug-coated balloon angioplasty using spider distal protection.   Procedure Description: The endhole catheter exchanged over an 035 versa core wire for a 7 [place] 55 cm multipurpose Ansell sheath.  Patient received a total of 10,500 units of heparin with an ACT of 323.  She was already on aspirin and Plavix.  I crossed the lesion with a 014 gram ZIlient  wire and then placed a 5 mm spider distal protection device in the right below the knee popliteal artery.  I then performed directional atherectomy using [facility] 1LS device performing multiple circumferential cuts and retrieving a significant amount of atherosclerotic plaque.  I then performed DCB using a 5 mm x 60 mm long impact Admiral drug coated balloon at nominal pressures for 2 and half minutes resulting reduction of a 75% concentric mid right SFA stenosis to 0% residual without a dissection.  The patient tolerated the procedure well.  The spider distal protection device was then captured and withdrawn from the body.  The 7 [place] Ansell sheath was then exchanged over an 035 versa core wire for a short 7 [place] sheath which was then secured in place.   Final Impression: Successful mid right SFA directional atherectomy followed by drug-coated angioplasty for lifestyle-limiting claudication.  The patient was  already on dual antiplatelet therapy with aspirin and Plavix.  Given her creatinine of 1.87 she will be hydrated overnight, and discharged home in the morning.  We will get lower extremity arterial Doppler studies in our [facility] line office next week and I will see her back the week after for follow-up.  She left the lab in stable condition. _____________   History of Present Illness     [name] is a 76 y.o. female with PAD, mild CAD, DM, HTN, HLD, anemia, remote history of colon CA, was admitted 2/20 for PV and possible intervention.  Hospital Course     Consultants: None  She tolerated the procedure well, but had a problem with a groin hematoma and bleeding after the procedure.  This was controlled with direct pressure, and resolved.  On 2/22, she was seen by Dr. [name] and all data were reviewed.  She has a history of anemia, her hemoglobin was 7.2 after the procedure.  She was rechecked later in the day and her hemoglobin dropped to 6.9, so she was transfused and kept overnight. On 02/22, her CBC showed ***   Her decreased hemoglobin was felt secondary to the procedure.  She also has a history of anemia, followed by Dr. Alvy Bimler.  She gets erythropoietin stimulating agent monthly.  She has no ongoing bleeding issues, continue aspirin and Plavix and follow-up with Dr. Alvy Bimler  She has chronic kidney disease and her creatinine was close to her baseline, but her BUN had gone from 37->54.  She was hydrated and it was rechecked. After  hydration, ***  She had an LAD stent in 2020, with other nonobstructive disease treated medically.  She is not having any ischemic symptoms.  Her blood pressure was low and her Imdur was stopped.  Once blood pressure improves, consider restart at a lower dose if she has ischemic symptoms.  Did the patient have an acute coronary syndrome (MI, NSTEMI, STEMI, etc) this admission?:  No                               Did the patient have a percutaneous coronary  intervention (stent / angioplasty)?:  No.    _____________  Discharge Vitals Blood pressure (!) 95/44, pulse 67, temperature 98.7 F (37.1 C), temperature source Oral, resp. rate 18, height 5\' 4"  (1.626 m), weight 59.4 kg, SpO2 98 %.  Filed Weights   05/31/21 0536 06/01/21 1553  Weight: 59.4 kg 59.4 kg    Labs & Radiologic Studies    CBC Recent Labs    06/01/21 0303 06/01/21 1300  WBC 8.3 9.8  HGB 7.2* 6.9*  HCT 23.2* 21.9*  MCV 91.3 89.8  PLT 170 732   Basic Metabolic Panel Recent Labs    06/01/21 0303  NA 143  K 3.7  CL 111  CO2 22  GLUCOSE 149*  BUN 54*  CREATININE 1.78*  CALCIUM 8.6*   Liver Function Tests No results for input(s): AST, ALT, ALKPHOS, BILITOT, PROT, ALBUMIN in the last 72 hours. No results for input(s): LIPASE, AMYLASE in the last 72 hours. High Sensitivity Troponin:   No results for input(s): TROPONINIHS in the last 720 hours.  BNP Invalid input(s): POCBNP D-Dimer No results for input(s): DDIMER in the last 72 hours. Hemoglobin A1C No results for input(s): HGBA1C in the last 72 hours. Fasting Lipid Panel No results for input(s): CHOL, HDL, LDLCALC, TRIG, CHOLHDL, LDLDIRECT in the last 72 hours. Thyroid Function Tests No results for input(s): TSH, T4TOTAL, T3FREE, THYROIDAB in the last 72 hours.  Invalid input(s): FREET3 _____________  PERIPHERAL VASCULAR CATHETERIZATION  Result Date: 05/31/2021 Images from the original result were not included.  [id number] LOCATION:  FACILITY: [facility] PHYSICIAN: Quay Burow, M.D. 20-Oct-1945 DATE OF PROCEDURE:  05/31/2021 DATE OF DISCHARGE: PV Angiogram/Intervention History obtained from chart review.[name] is a 76 y.o.  mildly overweight married African-American female mother of 73 living daughter (son committed suicide), grandmother to 6 grandchildren referred by Dr. Tamala Julian, her cardiologist, for peripheral vascular evaluation.  I last saw her in the office 02/29/2020.  She is accompanied by her  daughter [name] today.  In her younger years she worked for the [employer] and then for [employer].  Her risk factors include treated hypertension, diabetes and hyperlipidemia.  She is never had a heart attack or stroke.  She has had a diagnostic cath performed 04/17/2018 that showed some mild CAD.  She had COVID-19 back in January of this year recovered.  She does complain of lifestyle limiting claudication with Dopplers that were performed 01/29/2020 revealing a right ABI of 1.09 and a left of 0.89 with high-frequency signals in both SFAs. I performed peripheral angiography on her 03/23/2020 revealing 70% segmental mid left SFA stenosis.  I performed [facility] 1 directional atherectomy followed by drug-coated balloon angioplasty with excellent angiographic and clinical result.  Her most recent Doppler studies performed 12/16/2020 revealed a right ABI of 0.97 and a left of 1.01 with a widely patent left SFA.  She wishes to  proceed with right SFA intervention. Pre Procedure Diagnosis: Peripheral arterial disease Post Procedure Diagnosis: Peripheral arterial disease Operators: Dr. Quay Burow Procedures Performed:  1.  Ultrasound-guided left common femoral access  2.  Contralateral access (second order catheter placement)  3.  Right lower extremity angiography with runoff  4.  Placement of spider distal protection device right below the knee popliteal artery             5.  [facility] 1 directional atherectomy followed by drug-coated balloon angioplasty mid right SFA PROCEDURE DESCRIPTION: The patient was brought to the second floor [facility] cath lab in the the postabsorptive state. She was premedicated with IV Versed and fentanyl. Her left groin was prepped and shaved in usual sterile fashion. Xylocaine 1% was used for local anesthesia. A 5 French sheath was inserted into the left common femoral artery using standard Seldinger technique.  Ultrasound was used to identify the left common  femoral artery and guide access.  A digital image was captured and placed in patient's chart.  Contralateral access was obtained with a 5 [place] crossover catheter, a 3 5 versa core wire and a 5 French endhole catheter.  Right lower extremity angiography with runoff was performed using bolus chase, digital subtraction and step table technique.  Omnipaque dye was used for the entirety of the case (total 125 cc of contrast to patient).  Retrograde aortic pressures monitored during the case.  Angiographic Data: 1: Right lower extremity-75% mid right SFA stenosis, occluded anterior tibial artery   Ms. Paske has a significant lesion mid right SFA with claudication.  We will proceed with [facility] 1 directional atherectomy followed by drug-coated balloon angioplasty using spider distal protection. Procedure Description: The endhole catheter exchanged over an 035 versa core wire for a 7 [place] 55 cm multipurpose Ansell sheath.  Patient received a total of 10,500 units of heparin with an ACT of 323.  She was already on aspirin and Plavix. I crossed the lesion with a 014 gram ZIlient  wire and then placed a 5 mm spider distal protection device in the right below the knee popliteal artery.  I then performed directional atherectomy using [facility] 1LS device performing multiple circumferential cuts and retrieving a significant amount of atherosclerotic plaque.  I then performed DCB using a 5 mm x 60 mm long impact Admiral drug coated balloon at nominal pressures for 2 and half minutes resulting reduction of a 75% concentric mid right SFA stenosis to 0% residual without a dissection.  The patient tolerated the procedure well.  The spider distal protection device was then captured and withdrawn from the body.  The 7 [place] Ansell sheath was then exchanged over an 035 versa core wire for a short 7 [place] sheath which was then secured in place. Final Impression: Successful mid right SFA directional atherectomy followed by drug-coated  angioplasty for lifestyle-limiting claudication.  The patient was already on dual antiplatelet therapy with aspirin and Plavix.  Given her creatinine of 1.87 she will be hydrated overnight, and discharged home in the morning.  We will get lower extremity arterial Doppler studies in our [facility] line office next week and I will see her back the week after for follow-up.  She left the lab in stable condition. Quay Burow. MD, [facility] 05/31/2021 11:07 AM    Disposition   Pt is being discharged home today in good condition.  Follow-up Plans & Appointments       Discharge Medications   Allergies as of 06/01/2021  Reactions   Metformin And Related Diarrhea     Med Rec must be completed prior to using this [place]***          Outstanding Labs/Studies   None  Duration of Discharge Encounter   Greater than 30 minutes including physician time.  Signed, Rosaria Ferries, PA-C 06/01/2021, 3:59 PM

## 2021-06-01 NOTE — Plan of Care (Signed)
?  Problem: Clinical Measurements: ?Goal: Will remain free from infection ?Outcome: Progressing ?  ?Problem: Activity: ?Goal: Risk for activity intolerance will decrease ?Outcome: Progressing ?  ?Problem: Elimination: ?Goal: Will not experience complications related to bowel motility ?Outcome: Progressing ?  ?Problem: Pain Managment: ?Goal: General experience of comfort will improve ?Outcome: Progressing ?  ?

## 2021-06-01 NOTE — Discharge Instructions (Signed)
PLEASE REMEMBER TO BRING ALL OF YOUR MEDICATIONS TO EACH OF YOUR FOLLOW-UP OFFICE VISITS.  PLEASE ATTEND ALL SCHEDULED FOLLOW-UP APPOINTMENTS.   Activity: Increase activity slowly as tolerated. You may shower, but no soaking baths (or swimming) for 1 week. No driving for 2 days. No lifting over 5 lbs for 1 week. No sexual activity for 1 week.   You May Return to Work: in 1 week (if applicable)  Wound Care: You may wash cath site gently with soap and water. Keep cath site clean and dry. If you notice pain, swelling, bleeding or pus at your cath site, please call [phone].    Peripheral Vascular Cath Site Care Refer to this sheet in the next few weeks. These instructions provide you with information on caring for yourself after your procedure. Your caregiver may also give you more specific instructions. Your treatment has been planned according to current medical practices, but problems sometimes occur. Call your caregiver if you have any problems or questions after your procedure. HOME CARE INSTRUCTIONS You may shower 24 hours after the procedure. Remove the bandage (dressing) and gently wash the site with plain soap and water. Gently pat the site dry.  Do not apply powder or lotion to the site.  Do not sit in a bathtub, swimming pool, or whirlpool for 5 to 7 days.  No bending, squatting, or lifting anything over 10 pounds (4.5 kg) as directed by your caregiver.  Inspect the site at least twice daily.  Do not drive home if you are discharged the same day of the procedure. Have someone else drive you.  You may drive 24 hours after the procedure unless otherwise instructed by your caregiver.  What to expect: Any bruising will usually fade within 1 to 2 weeks.  Blood that collects in the tissue (hematoma) may be painful to the touch. It should usually decrease in size and tenderness within 1 to 2 weeks.  SEEK IMMEDIATE MEDICAL CARE IF: You have unusual pain at the site or down the affected  limb.  You have redness, warmth, swelling, or pain at the site.  You have drainage (other than a small amount of blood on the dressing).  You have chills.  You have a fever or persistent symptoms for more than 72 hours.  You have a fever and your symptoms suddenly get worse.  Your leg becomes pale, cool, tingly, or numb.  You have heavy bleeding from the site. Hold pressure on the site.  Document Released: 04/30/2010 Document Revised: 03/17/2011 Document Reviewed: 04/30/2010 [employer] Patient Information 2012 [place].

## 2021-06-01 NOTE — Progress Notes (Addendum)
Progress Note  Patient Name: [name]. Wynder Date of Encounter: 06/01/2021  [facility] Cardiologist: Anne Grooms, MD   Subjective   Patient feels lightheaded and weak.  Has not been out of bed since the procedure  Inpatient Medications    Scheduled Meds:  amLODipine  5 mg Oral Daily   aspirin EC  81 mg Oral Daily   clopidogrel  75 mg Oral Q breakfast   glipiZIDE  5 mg Oral QAC breakfast   hydrochlorothiazide  12.5 mg Oral Daily   isosorbide mononitrate  60 mg Oral Daily   lisinopril  20 mg Oral Daily   rosuvastatin  20 mg Oral Daily   sodium chloride flush  3 mL Intravenous Q12H   Continuous Infusions:  sodium chloride     PRN Meds: sodium chloride, acetaminophen, hydrALAZINE, labetalol, morphine injection, ondansetron (ZOFRAN) IV, sodium chloride flush   Vital Signs    Vitals:   05/31/21 1953 05/31/21 2350 06/01/21 0507 06/01/21 0739  BP: (!) 121/53 (!) 109/54 (!) 93/49 (!) 93/39  Pulse: 72 84 78 73  Resp: 17 15 17 19   Temp: 98.3 F (36.8 C) 99 F (37.2 C) 99.3 F (37.4 C) 99.3 F (37.4 C)  TempSrc: Oral Oral Oral Oral  SpO2: 100% 100% 96% 100%  Weight:      Height:        Intake/Output Summary (Last 24 hours) at 06/01/2021 0952 Last data filed at 06/01/2021 7035 Gross per 24 hour  Intake 1903.12 ml  Output --  Net 1903.12 ml   Last 3 Weights 05/31/2021 05/19/2021 02/18/2021  Weight (lbs) 131 lb 132 lb 9.6 oz 138 lb 12.8 oz  Weight (kg) 59.421 kg 60.147 kg 62.959 kg      Telemetry    Sinus rhythm with a brief episode of SVT- Personally Reviewed  ECG    None today- Personally Reviewed  Physical Exam   GEN: No acute distress.   Neck: No JVD Cardiac: RRR, no murmurs, rubs, or gallops.  Respiratory: Clear to auscultation bilaterally. GI: Soft, nontender, non-distended  MS: No edema; No deformity.  Right groin cath site with 6-8 cm area of ecchymosis and approximately 2 cm hematoma Neuro:  Nonfocal  Psych: Normal affect   Labs     High Sensitivity Troponin:  No results for input(s): TROPONINIHS in the last 720 hours.   Chemistry Recent Labs  Lab 06/01/21 0303  NA 143  K 3.7  CL 111  CO2 22  GLUCOSE 149*  BUN 54*  CREATININE 1.78*  CALCIUM 8.6*  GFRNONAA 30*  ANIONGAP 10    Lipids No results for input(s): CHOL, TRIG, HDL, LABVLDL, LDLCALC, CHOLHDL in the last 168 hours.  Hematology Recent Labs  Lab 06/01/21 0303  WBC 8.3  RBC 2.54*  HGB 7.2*  HCT 23.2*  MCV 91.3  MCH 28.3  MCHC 31.0  RDW 14.5  PLT 170   Lab Results  Component Value Date   IRON 68 09/30/2020   TIBC 285 09/30/2020   FERRITIN 63 09/30/2020    Thyroid No results for input(s): TSH, FREET4 in the last 168 hours.  BNPNo results for input(s): BNP, PROBNP in the last 168 hours.  DDimer No results for input(s): DDIMER in the last 168 hours.   Radiology    PERIPHERAL VASCULAR CATHETERIZATION  Result Date: 05/31/2021 Images from the original result were not included.  [id number] LOCATION:  FACILITY: [place] [facility] PHYSICIAN: [name], M.D. [birth year] DATE OF PROCEDURE:  05/31/2021 DATE OF  DISCHARGE: PV Angiogram/Intervention History obtained from chart review.[name] is a 76 y.o.  mildly overweight married African-American female mother of 47 living daughter (son committed suicide), grandmother to 6 grandchildren referred by Dr. Tamala [name], her cardiologist, for peripheral vascular evaluation.  I last saw her in the office 02/29/2020.  She is accompanied by her daughter [name] today.  In her younger years she worked for the [employer] and then for [employer].  Her risk factors include treated hypertension, diabetes and hyperlipidemia.  She is never had a heart attack or stroke.  She has had a diagnostic cath performed 04/17/2018 that showed some mild CAD.  She had COVID-19 back in January of this year recovered.  She does complain of lifestyle limiting claudication with Dopplers that were performed 01/29/2020 revealing a  right ABI of 1.09 and a left of 0.89 with high-frequency signals in both SFAs. I performed peripheral angiography on her 03/23/2020 revealing 70% segmental mid left SFA stenosis.  I performed [facility] 1 directional atherectomy followed by drug-coated balloon angioplasty with excellent angiographic and clinical result.  Her most recent Doppler studies performed 12/16/2020 revealed a right ABI of 0.97 and a left of 1.01 with a widely patent left SFA.  She wishes to proceed with right SFA intervention. Pre Procedure Diagnosis: Peripheral arterial disease Post Procedure Diagnosis: Peripheral arterial disease Operators: Dr. Quay [name] Procedures Performed:  1.  Ultrasound-guided left common femoral access  2.  Contralateral access (second order catheter placement)  3.  Right lower extremity angiography with runoff  4.  Placement of spider distal protection device right below the knee popliteal artery             5.  [facility] 1 directional atherectomy followed by drug-coated balloon angioplasty mid right SFA PROCEDURE DESCRIPTION: The patient was brought to the second floor [facility] cath lab in the the postabsorptive state. She was premedicated with IV Versed and fentanyl. Her left groin was prepped and shaved in usual sterile fashion. Xylocaine 1% was used for local anesthesia. A 5 French sheath was inserted into the left common femoral artery using standard Seldinger technique.  Ultrasound was used to identify the left common femoral artery and guide access.  A digital image was captured and placed in patient's chart.  Contralateral access was obtained with a 5 [place] crossover catheter, a 3 5 versa core wire and a 5 French endhole catheter.  Right lower extremity angiography with runoff was performed using bolus chase, digital subtraction and step table technique.  Omnipaque dye was used for the entirety of the case (total 125 cc of contrast to patient).  Retrograde aortic pressures monitored during the case.   Angiographic Data: 1: Right lower extremity-75% mid right SFA stenosis, occluded anterior tibial artery   Ms. [name] has a significant lesion mid right SFA with claudication.  We will proceed with [facility] 1 directional atherectomy followed by drug-coated balloon angioplasty using spider distal protection. Procedure Description: The endhole catheter exchanged over an 035 versa core wire for a 7 [place] 55 cm multipurpose Ansell sheath.  Patient received a total of 10,500 units of heparin with an ACT of 323.  She was already on aspirin and Plavix. I crossed the lesion with a 014 gram ZIlient  wire and then placed a 5 mm spider distal protection device in the right below the knee popliteal artery.  I then performed directional atherectomy using [facility] 1LS device performing multiple circumferential cuts and retrieving a significant amount of atherosclerotic  plaque.  I then performed DCB using a 5 mm x 60 mm long impact Admiral drug coated balloon at nominal pressures for 2 and half minutes resulting reduction of a 75% concentric mid right SFA stenosis to 0% residual without a dissection.  The patient tolerated the procedure well.  The spider distal protection device was then captured and withdrawn from the body.  The 7 [place] Ansell sheath was then exchanged over an 035 versa core wire for a short 7 [place] sheath which was then secured in place. Final Impression: Successful mid right SFA directional atherectomy followed by drug-coated angioplasty for lifestyle-limiting claudication.  The patient was already on dual antiplatelet therapy with aspirin and Plavix.  Given her creatinine of 1.87 she will be hydrated overnight, and discharged home in the morning.  We will get lower extremity arterial Doppler studies in our [facility] line office next week and I will see her back the week after for follow-up.  She left the lab in stable condition. [name]. MD, [facility] 05/31/2021 11:07 AM     Cardiac Studies   PV CATH:  05/31/2021 Angiographic Data:    1: Right lower extremity-75% mid right SFA stenosis, occluded anterior tibial artery   IMPRESSION: Ms. Speedy has a significant lesion mid right SFA with claudication.  We will proceed with [facility] 1 directional atherectomy followed by drug-coated balloon angioplasty using spider distal protection.   Procedure Description: The endhole catheter exchanged over an 035 versa core wire for a 7 [place] 55 cm multipurpose Ansell sheath.  Patient received a total of 10,500 units of heparin with an ACT of 323.  She was already on aspirin and Plavix.  I crossed the lesion with a 014 gram ZIlient  wire and then placed a 5 mm spider distal protection device in the right below the knee popliteal artery.  I then performed directional atherectomy using [facility] 1LS device performing multiple circumferential cuts and retrieving a significant amount of atherosclerotic plaque.  I then performed DCB using a 5 mm x 60 mm long impact Admiral drug coated balloon at nominal pressures for 2 and half minutes resulting reduction of a 75% concentric mid right SFA stenosis to 0% residual without a dissection.  The patient tolerated the procedure well.  The spider distal protection device was then captured and withdrawn from the body.  The 7 [place] Ansell sheath was then exchanged over an 035 versa core wire for a short 7 [place] sheath which was then secured in place.   Final Impression: Successful mid right SFA directional atherectomy followed by drug-coated angioplasty for lifestyle-limiting claudication.  The patient was already on dual antiplatelet therapy with aspirin and Plavix.  Given her creatinine of 1.87 she will be hydrated overnight, and discharged home in the morning.  We will get lower extremity arterial Doppler studies in our [facility] line office next week and I will see her back the week after for follow-up.  She left the lab in stable condition.    Patient Profile     76 y.o. female with 6  PAD, mild CAD, DM, HTN, HLD, anemia, remote history of colon CA, was admitted 2/20 for PV and possible intervention  Assessment & Plan    1.  PAD: - Procedure results are above, she had successful mid right SFA directional atherectomy and drug-coated balloon angioplasty - Postprocedure, she had some problems with groin bleeding and hematoma, treated with direct pressure - Today, the hematoma is small but is still palpable and there is some  ecchymosis around the cath site. -Continue aspirin and Plavix  2.  Acute on chronic anemia - Secondary to hematoma and bleeding post cath - Hemoglobin 02/02 was 10.3, 02/08 was 9.8 -Today hemoglobin 7.2 -Discuss with MD if she needs a transfusion  3.  Hypertension: - Prior to admission she was on amlodipine 5 mg daily, Imdur 60 mg daily, quinapril HCTZ 20-12.5 mg daily - She is currently on all of her home medications, has had all of them today -She feels lightheaded and weak, SBP in the 90s - Fluid bolus  4.  CKD IV: - Creatinine is close to her baseline, but BUN is elevated - Preprocedure BUN was 37, today 54 - Give 500 cc fluid bolus and follow, recheck as an outpatient  For questions or updates, please contact [place] Please consult [email or web address] for contact info under        Signed, Rosaria Ferries, PA-C  06/01/2021, 9:52 AM     ATTENDING ATTESTATION:  After conducting a review of all available clinical information with the care team, interviewing the patient, and performing a physical exam, I agree with the findings and plan described in this note.  Patient doing well after right SFA revascularization.  Her left groin site is stable.  She had some low blood pressures this morning and was symptomatic.  Her hemoglobin is moderately low at 7.2.  We will check another hemoglobin later today.  If above 7 and the patient feels well I think it is safe for her to be discharged.  We will stop Imdur.  She has no obstructive or serious  coronary artery disease and tells me she has had no angina recently.  Consider altering her hypertensive regimen as an outpatient.  Continue DAPT.  Possible discharge later today depending on course over the next few hours.   GEN: No acute distress.   Cardiac: RRR, no murmurs, rubs, or gallops.  Respiratory: Clear to auscultation bilaterally. GI: Soft, nontender, non-distended  MS: No edema; No deformity. Neuro:  Nonfocal  Vasc:  +2 radial pulses   Lenna Sciara, MD Pager [phone]   Addendum 06/01/21 14:30 Hgb down to 6.9; no signs or active bleeding.  Patient stable.  Will give 1U PRBCs.  Lenna Sciara, MD

## 2021-06-01 NOTE — Progress Notes (Addendum)
Critical value received, MD paged.   [name], RN 06/01/2021 2:04 PM    [name]  [name] Paged

## 2021-06-02 DIAGNOSIS — I739 Peripheral vascular disease, unspecified: Secondary | ICD-10-CM | POA: Diagnosis not present

## 2021-06-02 LAB — TYPE AND SCREEN
ABO/RH(D): A POS
Antibody Screen: NEGATIVE
Unit division: 0

## 2021-06-02 LAB — BPAM RBC
Blood Product Expiration Date: 202302242359
ISSUE DATE / TIME: 202302211708
Unit Type and Rh: 6200

## 2021-06-02 LAB — HEMOGLOBIN AND HEMATOCRIT, BLOOD
HCT: 25.3 % — ABNORMAL LOW (ref 36.0–46.0)
Hemoglobin: 8.2 g/dL — ABNORMAL LOW (ref 12.0–15.0)

## 2021-06-02 MED ORDER — SODIUM CHLORIDE 0.9 % IV BOLUS: 250.0000 mL | Freq: Once | INTRAVENOUS | Status: DC

## 2021-06-02 NOTE — Progress Notes (Signed)
°  Transition of Care [facility]) Screening Note   Patient Details  Name: [name]. [name] Date of Birth: 07-17-1945   [facility]) CM/SW Contact:    Dawayne Patricia, RN Phone Number: 06/02/2021, 3:05 PM    Transition of Care Department [facility]) has reviewed patient and no TOC needs have been identified at this time. Pt stable for transition home today.

## 2021-06-02 NOTE — Progress Notes (Signed)
Pt blood transfusion complete. Pt tolerated well.Pt denies any reaction symptoms. RN will continue to monitor pt.

## 2021-06-02 NOTE — Discharge Summary (Addendum)
Discharge Summary    Patient ID: [name] MRN: [id number]; DOB: 07/19/1945  Admit date: 05/31/2021 Discharge date: 06/02/2021  PCP:  Caren Macadam, MD   [facility] Providers Cardiologist:  Sinclair Grooms, MD      Discharge Diagnoses    Principal Problem:   Claudication in peripheral vascular disease [facility]) Active Problems:   Type 2 diabetes mellitus with diabetic chronic kidney disease ([place])   Hyperlipidemia associated with type 2 diabetes mellitus ([place])   Hypertension associated with diabetes ([place])   CAD S/P percutaneous coronary angioplasty    Diagnostic Studies/Procedures    Lower Extremity Angiography, Peripheral Vascular Atherectomy  Pre Procedure Diagnosis: Peripheral arterial disease   Post Procedure Diagnosis: Peripheral arterial disease   Operators: Dr. Quay Burow   Procedures Performed:             1.  Ultrasound-guided left common femoral access             2.  Contralateral access (second order catheter placement)             3.  Right lower extremity angiography with runoff             4.  Placement of spider distal protection device right below the knee popliteal artery             5.  Hawk 1 directional atherectomy followed by drug-coated balloon angioplasty mid right SFA  Angiographic Data:    1: Right lower extremity-75% mid right SFA stenosis, occluded anterior tibial artery   IMPRESSION: [name] has a significant lesion mid right SFA with claudication.  We will proceed with [facility] 1 directional atherectomy followed by drug-coated balloon angioplasty using spider distal protection.  Final Impression: Successful mid right SFA directional atherectomy followed by drug-coated angioplasty for lifestyle-limiting claudication.  The patient was already on dual antiplatelet therapy with aspirin and Plavix.  Given her creatinine of 1.87 she will be hydrated overnight, and discharged home in the morning.  We will get lower extremity arterial  Doppler studies in our [facility] line office next week and I will see her back the week after for follow-up.  She left the lab in stable condition. _____________   History of Present Illness     [name] is a 76 y.o. female with PAD, mild CAD, DM, HTN, HLD, anemia, remote history of colon CA, was admitted 2/20 for PV and possible intervention.   Hospital Course     Consultants: None   PAD Acute blood loss on chronic anemia Status post right SFA angioplasty.  Hematoma almost resolved, no active bleeding observed.  Hemoglobin improved after 1 unit of blood yesterday.  - Continue aspirin and Plavix - Continue statin therapy  - Lower extremity arterial doppler studies and outpatient follow up scheduled.    Hypertension - Patient reported orthostatic symptoms this morning.  BP was low with MAP in the 60s. She has received her BP medications today. - Checked orthostatic vital signs, patient was found to be orthostatic. Given 250 cc bolus with improvement. BP 124/47 prior to discharge -Hold amlodipine and Imdur for now as patient has had some low BP while admitted. Will continue to hold until follow up visit. Can reassess at that time.  - Continue home quinapril-hydrochlorothiazide    CKD IV - Creatinine improved and is closed to baseline.  - Creatinine 1.78 yesterday. Baseline around 1.6-1.8   Did the patient have an acute coronary syndrome (MI, NSTEMI, STEMI, etc) this  admission?:  No                               Did the patient have a percutaneous coronary intervention (stent / angioplasty)?:  No.    Patient was seen and evaluated by Dr. [name] and deemed stable for discharge.   Patient has outpatient follow up arranged with Dr. [name] on 06/11/21.     _____________  Discharge Vitals Blood pressure (!) 124/47, pulse 62, temperature 98.8 F (37.1 C), temperature source Oral, resp. rate 12, height 5\' 4"  (1.626 m), weight 59.4 kg, SpO2 100 %.  Filed Weights   05/31/21 0536  06/01/21 1553  Weight: 59.4 kg 59.4 kg    Labs & Radiologic Studies    CBC Recent Labs    06/01/21 0303 06/01/21 1300 06/02/21 0602  WBC 8.3 9.8  --   HGB 7.2* 6.9* 8.2*  HCT 23.2* 21.9* 25.3*  MCV 91.3 89.8  --   PLT 170 160  --    Basic Metabolic Panel Recent Labs    06/01/21 0303  NA 143  K 3.7  CL 111  CO2 22  GLUCOSE 149*  BUN 54*  CREATININE 1.78*  CALCIUM 8.6*   Liver Function Tests No results for input(s): AST, ALT, ALKPHOS, BILITOT, PROT, ALBUMIN in the last 72 hours. No results for input(s): LIPASE, AMYLASE in the last 72 hours. High Sensitivity Troponin:   No results for input(s): TROPONINIHS in the last 720 hours.  BNP Invalid input(s): POCBNP D-Dimer No results for input(s): DDIMER in the last 72 hours. Hemoglobin A1C No results for input(s): HGBA1C in the last 72 hours. Fasting Lipid Panel No results for input(s): CHOL, HDL, LDLCALC, TRIG, CHOLHDL, LDLDIRECT in the last 72 hours. Thyroid Function Tests No results for input(s): TSH, T4TOTAL, T3FREE, THYROIDAB in the last 72 hours.  Invalid input(s): FREET3 _____________  PERIPHERAL VASCULAR CATHETERIZATION  Result Date: 05/31/2021 Images from the original result were not included.  [id number] LOCATION:  FACILITY: [facility] PHYSICIAN: Quay Burow, M.D. [birth year] DATE OF PROCEDURE:  05/31/2021 DATE OF DISCHARGE: PV Angiogram/Intervention History obtained from chart review.[name] is a 76 y.o.  mildly overweight married African-American female mother of 71 living daughter (son committed suicide), grandmother to 6 grandchildren referred by Dr. Tamala Julian, her cardiologist, for peripheral vascular evaluation.  I last saw her in the office 02/29/2020.  She is accompanied by her daughter [name] today.  In her younger years she worked for the [employer] and then for [employer].  Her risk factors include treated hypertension, diabetes and hyperlipidemia.  She is never had a heart attack  or stroke.  She has had a diagnostic cath performed 04/17/2018 that showed some mild CAD.  She had COVID-19 back in January of this year recovered.  She does complain of lifestyle limiting claudication with Dopplers that were performed 01/29/2020 revealing a right ABI of 1.09 and a left of 0.89 with high-frequency signals in both SFAs. I performed peripheral angiography on her 03/23/2020 revealing 70% segmental mid left SFA stenosis.  I performed [facility] 1 directional atherectomy followed by drug-coated balloon angioplasty with excellent angiographic and clinical result.  Her most recent Doppler studies performed 12/16/2020 revealed a right ABI of 0.97 and a left of 1.01 with a widely patent left SFA.  She wishes to proceed with right SFA intervention. Pre Procedure Diagnosis: Peripheral arterial disease Post Procedure Diagnosis: Peripheral arterial disease Operators: Dr. Quay Burow Procedures  Performed:  1.  Ultrasound-guided left common femoral access  2.  Contralateral access (second order catheter placement)  3.  Right lower extremity angiography with runoff  4.  Placement of spider distal protection device right below the knee popliteal artery             5.  Hawk 1 directional atherectomy followed by drug-coated balloon angioplasty mid right SFA PROCEDURE DESCRIPTION: The patient was brought to the second floor [facility] cath lab in the the postabsorptive state. She was premedicated with IV Versed and fentanyl. Her left groin was prepped and shaved in usual sterile fashion. Xylocaine 1% was used for local anesthesia. A 5 French sheath was inserted into the left common femoral artery using standard Seldinger technique.  Ultrasound was used to identify the left common femoral artery and guide access.  A digital image was captured and placed in patient's chart.  Contralateral access was obtained with a 5 [place] crossover catheter, a 3 5 versa core wire and a 5 French endhole catheter.  Right lower extremity  angiography with runoff was performed using bolus chase, digital subtraction and step table technique.  Omnipaque dye was used for the entirety of the case (total 125 cc of contrast to patient).  Retrograde aortic pressures monitored during the case.  Angiographic Data: 1: Right lower extremity-75% mid right SFA stenosis, occluded anterior tibial artery   Ms. Hafen has a significant lesion mid right SFA with claudication.  We will proceed with [facility] 1 directional atherectomy followed by drug-coated balloon angioplasty using spider distal protection. Procedure Description: The endhole catheter exchanged over an 035 versa core wire for a 7 [place] 55 cm multipurpose Ansell sheath.  Patient received a total of 10,500 units of heparin with an ACT of 323.  She was already on aspirin and Plavix. I crossed the lesion with a 014 gram ZIlient  wire and then placed a 5 mm spider distal protection device in the right below the knee popliteal artery.  I then performed directional atherectomy using [facility] 1LS device performing multiple circumferential cuts and retrieving a significant amount of atherosclerotic plaque.  I then performed DCB using a 5 mm x 60 mm long impact Admiral drug coated balloon at nominal pressures for 2 and half minutes resulting reduction of a 75% concentric mid right SFA stenosis to 0% residual without a dissection.  The patient tolerated the procedure well.  The spider distal protection device was then captured and withdrawn from the body.  The 7 [place] Ansell sheath was then exchanged over an 035 versa core wire for a short 7 [place] sheath which was then secured in place. Final Impression: Successful mid right SFA directional atherectomy followed by drug-coated angioplasty for lifestyle-limiting claudication.  The patient was already on dual antiplatelet therapy with aspirin and Plavix.  Given her creatinine of 1.87 she will be hydrated overnight, and discharged home in the morning.  We will get lower  extremity arterial Doppler studies in our [facility] line office next week and I will see her back the week after for follow-up.  She left the lab in stable condition. Quay Burow. MD, [facility] 05/31/2021 11:07 AM    Disposition   Pt is being discharged home today in good condition.  Follow-up Plans & Appointments     Follow-up Information     Lorretta Harp, MD Follow up on 06/11/2021.   Specialties: Cardiology, Radiology Why: Appointment at 9:15 AM Contact information: [address] [place] [address]  [phone]                  Discharge Medications   Allergies as of 06/02/2021       Reactions   Metformin And Related Diarrhea        Medication List     STOP taking these medications    amLODipine 5 MG tablet Commonly known as: NORVASC   isosorbide mononitrate 60 MG 24 hr tablet Commonly known as: IMDUR       TAKE these medications    aspirin 81 MG chewable tablet Chew 81 mg by mouth daily.   clopidogrel 75 MG tablet Commonly known as: PLAVIX TAKE 1 TABLET(75 MG) BY MOUTH DAILY   dapagliflozin propanediol 10 MG Tabs tablet Commonly known as: FARXIGA Take 10 mg by mouth daily.   famotidine 20 MG tablet Commonly known as: PEPCID TAKE 1 TABLET(20 MG) BY MOUTH TWICE DAILY AS NEEDED FOR HEARTBURN OR INDIGESTION   glipiZIDE 5 MG tablet Commonly known as: GLUCOTROL Take 5 mg by mouth daily before breakfast.   nitroGLYCERIN 0.4 MG SL tablet Commonly known as: NITROSTAT Place 1 tablet (0.4 mg total) under the tongue every 5 (five) minutes as needed for chest pain.   polyethylene glycol powder 17 GM/SCOOP powder Commonly known as: GLYCOLAX/MIRALAX Take 17 g by mouth daily. What changed:  when to take this reasons to take this   quinapril-hydrochlorothiazide 20-12.5 MG tablet Commonly known as: ACCURETIC TAKE 1 TABLET BY MOUTH DAILY   rosuvastatin 20 MG tablet Commonly known as: CRESTOR TAKE 1 TABLET(20 MG) BY MOUTH DAILY            Outstanding Labs/Studies   Lower extremity doppler studies on 3/2  Duration of Discharge Encounter   Greater than 30 minutes including physician time.  Signed, Margie Billet, PA-C 06/02/2021, 2:54 PM   ATTENDING ATTESTATION:   After conducting a review of all available clinical information with the care team, interviewing the patient, and performing a physical exam, I agree with the findings and plan described in this note.   GEN: No acute distress.   Cardiac: RRR, no murmurs, rubs, or gallops.  Respiratory: Clear to auscultation bilaterally. GI: Soft, nontender, non-distended  MS: No edema; No deformity. Neuro:  Nonfocal  Vasc:  +2 radial pulses; palpable pedal pulses with intact access site   Patient is doing well.  She had an appropriate bump in her hemoglobin with a blood transfusion.  She was mildly orthostatic this morning and continues to be; we are holding her amlodipine and Imdur.  After a 250cc bolus, she feels improved.  Discharge with follow up arranged.   Lenna Sciara, MD

## 2021-06-02 NOTE — Progress Notes (Signed)
Pt d/c, VSS, IV removed, tele removed, education complete.   [name], RN 06/02/2021 3:25 PM

## 2021-06-02 NOTE — Progress Notes (Addendum)
Progress Note  Patient Name: [name]. [place] Date of Encounter: 06/02/2021  Primary Cardiologist: Sinclair Grooms, MD   Subjective   Patient seen at bedside.  She appears comfortable but reports lightheadedness and blurry vision with sitting up in her chair.  Inpatient Medications    Scheduled Meds:  amLODipine  5 mg Oral Daily   aspirin EC  81 mg Oral Daily   clopidogrel  75 mg Oral Q breakfast   glipiZIDE  5 mg Oral QAC breakfast   hydrochlorothiazide  12.5 mg Oral Daily   lisinopril  20 mg Oral Daily   rosuvastatin  20 mg Oral Daily   sodium chloride flush  3 mL Intravenous Q12H   Continuous Infusions:  sodium chloride     PRN Meds: sodium chloride, acetaminophen, hydrALAZINE, labetalol, morphine injection, ondansetron (ZOFRAN) IV, sodium chloride flush   Vital Signs    Vitals:   06/01/21 2115 06/01/21 2330 06/02/21 0350 06/02/21 0749  BP:  (!) 110/35 (!) 125/47 (!) 110/42  Pulse: 73 64 60 (!) 57  Resp:  18 20 19   Temp: 99.2 F (37.3 C) 100 F (37.8 C) 98.6 F (37 C) 98.3 F (36.8 C)  TempSrc: Oral Oral Oral Oral  SpO2: 98% 100% 100% 98%  Weight:      Height:        Intake/Output Summary (Last 24 hours) at 06/02/2021 1002 Last data filed at 06/02/2021 0900 Gross per 24 hour  Intake 1449 ml  Output 1500 ml  Net -51 ml   Filed Weights   05/31/21 0536 06/01/21 1553  Weight: 59.4 kg 59.4 kg    Telemetry    NSR - Personally Reviewed  ECG    2/8: sinus bradycardia - Personally Reviewed  Physical Exam   GEN: No acute distress.   Cardiac: RRR, no murmurs, rubs, or gallops.  Respiratory: Normal work of breathing GI: Soft, nontender, non-distended  MS: No edema; No deformity. Groin hematoma almost resolved Neuro:  Nonfocal  Psych: Normal affect   Labs    Chemistry Recent Labs  Lab 06/01/21 0303  NA 143  K 3.7  CL 111  CO2 22  GLUCOSE 149*  BUN 54*  CREATININE 1.78*  CALCIUM 8.6*  GFRNONAA 30*  ANIONGAP 10      Hematology Recent Labs  Lab 06/01/21 0303 06/01/21 1300 06/02/21 0602  WBC 8.3 9.8  --   RBC 2.54* 2.44*  --   HGB 7.2* 6.9* 8.2*  HCT 23.2* 21.9* 25.3*  MCV 91.3 89.8  --   MCH 28.3 28.3  --   MCHC 31.0 31.5  --   RDW 14.5 14.4  --   PLT 170 160  --     Cardiac EnzymesNo results for input(s): TROPONINI in the last 168 hours. No results for input(s): TROPIPOC in the last 168 hours.   BNPNo results for input(s): BNP, PROBNP in the last 168 hours.   DDimer No results for input(s): DDIMER in the last 168 hours.   Radiology    No results found.  Cardiac Studies    PV CATH: 05/31/2021 Angiographic Data:    1: Right lower extremity-75% mid right SFA stenosis, occluded anterior tibial artery   IMPRESSION: Ms. Rooks has a significant lesion mid right SFA with claudication.  We will proceed with [facility] 1 directional atherectomy followed by drug-coated balloon angioplasty using spider distal protection.   Procedure Description: The endhole catheter exchanged over an 035 versa core wire for a 7 French 55  cm multipurpose Ansell sheath.  Patient received a total of 10,500 units of heparin with an ACT of 323.  She was already on aspirin and Plavix.  I crossed the lesion with a 014 gram ZIlient  wire and then placed a 5 mm spider distal protection device in the right below the knee popliteal artery.  I then performed directional atherectomy using [facility] 1LS device performing multiple circumferential cuts and retrieving a significant amount of atherosclerotic plaque.  I then performed DCB using a 5 mm x 60 mm long impact Admiral drug coated balloon at nominal pressures for 2 and half minutes resulting reduction of a 75% concentric mid right SFA stenosis to 0% residual without a dissection.  The patient tolerated the procedure well.  The spider distal protection device was then captured and withdrawn from the body.  The 7 [place] Ansell sheath was then exchanged over an 035 versa core wire for  a short 7 [place] sheath which was then secured in place.   Final Impression: Successful mid right SFA directional atherectomy followed by drug-coated angioplasty for lifestyle-limiting claudication.  The patient was already on dual antiplatelet therapy with aspirin and Plavix.  Given her creatinine of 1.87 she will be hydrated overnight, and discharged home in the morning.  We will get lower extremity arterial Doppler studies in our [facility] line office next week and I will see her back the week after for follow-up.  She left the lab in stable condition.  Patient Profile     76 y.o. female with 6 PAD, mild CAD, DM, HTN, HLD, anemia, remote history of colon CA, was admitted 2/20 for PV and possible intervention.   Assessment & Plan    PAD Acute blood loss on chronic anemia Status post right SFA angioplasty.  Hematoma almost resolved, no active bleeding observed.  Hemoglobin improved after 1 unit of blood yesterday. -Continue aspirin and Plavix  Hypertension Patient reports orthostatic symptoms this morning.  BP is low with MAP in the 60s.  She has received her BP medications today. -Will check orthostatic vital sign.  If positive will give a fluid -Hold amlodipine and HCTZ for now  CKD IV Creatinine improved and is closed to baseline. -Encourage p.o. intake  For questions or updates, please contact [place] Please consult [email or web address] for contact info under Cardiology/STEMI.      Signed, Gaylan Gerold, DO  06/02/2021, 10:02 AM     ATTENDING ATTESTATION:  After conducting a review of all available clinical information with the care team, interviewing the patient, and performing a physical exam, I agree with the findings and plan described in this note.   GEN: No acute distress.   Cardiac: RRR, no murmurs, rubs, or gallops.  Respiratory: Clear to auscultation bilaterally. GI: Soft, nontender, non-distended  MS: No edema; No deformity. Neuro:  Nonfocal  Vasc:  +2 radial pulses;  palpable pedal pulses with intact access site  Patient is doing well.  She had an appropriate bump in her hemoglobin with a blood transfusion.  She was mildly orthostatic this morning and continues to be; we are holding her amlodipine and HCTZ.  We will give her 250 cc bolus and reassess later today for potential discharge.  Lenna Sciara, MD Pager [phone]

## 2021-06-03 ENCOUNTER — Other Ambulatory Visit: Payer: Self-pay | Admitting: *Deleted

## 2021-06-04 NOTE — Patient Outreach (Signed)
[facility]) Care Management Telephonic RN Care Manager Note   06/03/2021 Name:  [name]. [place] MRN:  [id number] DOB:  June 06, 1945  Summary: Verified no acute needs and pt has received her new glucometer from her provider's pharmacy with daily CBG monitoring. Will continue to encouraged adherence.  Recommendations/Changes made from today's visit: Pt awareness to contact her provider with any abnormal readings or related issues due to her recent hospital discharge.  Subjective: [name] is an 76 y.o. year old female who is a primary patient of Hagler, Apolonio Schneiders, MD. The care management team was consulted for assistance with care management and/or care coordination needs.    Telephonic RN Care Manager completed Telephone Visit today.  Objective:   Medications Reviewed Today     Reviewed by Merceda Elks, CPhT (Pharmacy Technician) on 05/25/21 at [place] List Status: Complete   Medication Order Taking? Sig Documenting Provider Last Dose Status Informant    Discontinued 05/25/21 1644 (Patient Preference) amLODipine (NORVASC) 5 MG tablet [id number] Yes TAKE 1 TABLET(5 MG) BY MOUTH DAILY Belva Crome, MD  Active Family Member  aspirin 81 MG chewable tablet [id number] Yes Chew 81 mg by mouth daily. [provider]  Active Family Member    Discontinued 05/25/21 1644 (Patient Preference)   clopidogrel (PLAVIX) 75 MG tablet [id number] Yes TAKE 1 TABLET(75 MG) BY MOUTH DAILY Belva Crome, MD  Active Family Member  dapagliflozin propanediol (FARXIGA) 10 MG TABS tablet [id number] Yes Take 10 mg by mouth daily. [provider]  Active Family Member  famotidine (PEPCID) 20 MG tablet [id number] Yes TAKE 1 TABLET(20 MG) BY MOUTH TWICE DAILY AS NEEDED FOR HEARTBURN OR INDIGESTION [name], MD  Active Family Member  glipiZIDE (GLUCOTROL) 5 MG tablet [id number] Yes Take 5 mg by mouth daily before breakfast. [provider]  Active Family Member     Discontinued 05/25/21 1644 (Patient Preference)     Discontinued 05/25/21 1642 (Discontinued by provider)     Discontinued 05/25/21 1643 (Patient Preference) isosorbide mononitrate (IMDUR) 60 MG 24 hr tablet [id number] Yes Take 1 tablet (60 mg total) by mouth daily. TAKE 1 TABLET(60 MG) BY MOUTH DAILY Strength: 60 mg Belva Crome, MD  Active Family Member    Discontinued 05/25/21 1643 (Patient Preference)   Discontinued 05/25/21 1643 (Patient Preference)   nitroGLYCERIN (NITROSTAT) 0.4 MG SL tablet [id number] Yes Place 1 tablet (0.4 mg total) under the tongue every 5 (five) minutes as needed for chest pain. McDiarmid, Blane Ohara, MD  Active Family Member  polyethylene glycol powder [facility]) 17 GM/SCOOP powder [id number] Yes Take 17 g by mouth daily.  Patient taking differently: Take 17 g by mouth daily as needed for moderate constipation.   McDiarmid, Blane Ohara, MD  Active Family Member  quinapril-hydrochlorothiazide (ACCURETIC) 20-12.5 MG tablet [id number] Yes TAKE 1 TABLET BY MOUTH DAILY Belva Crome, MD  Active Family Member  rosuvastatin (CRESTOR) 20 MG tablet [id number] Yes TAKE 1 TABLET(20 MG) BY MOUTH DAILY Belva Crome, MD  Active Family Member  sodium chloride flush (NS) 0.9 % injection 3 mL [id number]   Lorretta Harp, MD  Active              SDOH:  (Social Determinants of Health) assessments and interventions performed:     Care Plan  Review of patient past medical history, allergies, medications, health status, including review of consultants reports, laboratory and other test data, was performed as part of comprehensive evaluation for  care management services.   Care Plan : RN Care Manager Plan of Care  Updates made by [name], RN since 06/04/2021 12:00 AM     Problem: THN-Knowledge Deficit related to Diabetes Management and Care Coordination Needs   Priority: High     Long-Range Goal: Development Plan of Care Management for Diabetes and Complex Care  Coordination needs   Start Date: 02/24/2021  Expected End Date: 10/08/2021  This Visit's Progress: On track  Recent Progress: On track  Priority: High  Note:   Current Barriers:  Knowledge Deficits related to plan of care for management of DMII  Chronic Disease Management support and education needs related to DMII   RNCM Clinical Goal(s):  Patient will verbalize understanding of plan for management of DMII as evidenced by Self report take all medications exactly as prescribed and will call provider for medication related questions as evidenced by self report and  through collaboration with RN Care manager, provider, and care team.   Interventions: Inter-disciplinary care team collaboration (see longitudinal plan of care) Evaluation of current treatment plan related to  self management and patient's adherence to plan as established by provider   Diabetes Interventions:  (Status:  Goal on track:  Yes.) Long Term Goal Assessed patient's understanding of A1c goal: <6.5% Provided education to patient about basic DM disease process Reviewed medications with patient and discussed importance of medication adherence Discussed plans with patient for ongoing care management follow up and provided patient with direct contact information for care management team Provided patient with written educational materials related to hypo and hyperglycemia and importance of correct treatment Lab Results  Component Value Date   HGBA1C 9.7 (A) 10/01/2020   1/16: Pt continue to report awaiting on the glucometer that reads and reports pt's glucose readings. [facility] working with pt on obtaining this devices. 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 Pt's new medication provided (Farxiga 10 mg) and express that this medication has been very helpful. Last provider's office read in A1C was 6/22 at 9.7. Update 2/23: Pt recently discharged from the hospital  in the care of family. Pt reports she has received her pending glucometer from her provider's pharmacy [name]) but her daughters have been recording all her readings with no extremely high or low readings. Pt denies any acute symptoms related to her CBGs and continues to be monitoring by her provider's pharmacist. Pt reports she completed a HCPOA while inpt status and feels better with all her medications. Pt decline review of medications and has an appointment on next Thursday with provider to review at this time. Pt continues to have some groin pain however tolerable and decline any interventions from her provider. Pt  unable to report any CBG reads due to her daughters documenting all readings and pt unable to read. RN requested pt to be able to report some of her AM readings on the next outreach call for closer monitoring for her diabetes management. Pt wishes to continue with monthly follow up calls. No acute needs at this time as pt has supportive family to intervene with any encountered issues or needs at this time. Will continue to review and reiterate on the plan of care and strongly encouraged adherence for prevention measures in managing her diabetes.  Patient Goals/Self-Care Activities: Take all medications as prescribed Attend all scheduled provider appointments Call pharmacy for medication refills 3-7 days in advance of running out of medications  Attend church or other social activities Perform all self care activities independently  Perform IADL's (shopping, preparing meals, housekeeping, managing finances) independently Call provider office for new concerns or questions  keep appointment with eye doctor check feet daily for cuts, sores or redness enter blood sugar readings and medication or insulin into daily log trim toenails straight across drink 6 to 8 glasses of water each day eat fish at least once per week fill half of plate with vegetables keep a food diary manage portion  size set a realistic goal keep feet up while sitting wash and dry feet carefully every day wear comfortable, cotton socks wear comfortable, well-fitting shoes  Follow Up Plan:  Telephone follow up appointment with care management team member scheduled for:  March 2023 The patient has been provided with contact information for the care management team and has been advised to call with any health related questions or concerns.       Raina Mina, RN Care Management Coordinator [place] Office [phone]

## 2021-06-08 ENCOUNTER — Other Ambulatory Visit: Payer: Self-pay | Admitting: *Deleted

## 2021-06-08 DIAGNOSIS — E118 Type 2 diabetes mellitus with unspecified complications: Secondary | ICD-10-CM | POA: Diagnosis not present

## 2021-06-08 DIAGNOSIS — I10 Essential (primary) hypertension: Secondary | ICD-10-CM | POA: Diagnosis not present

## 2021-06-08 DIAGNOSIS — E7800 Pure hypercholesterolemia, unspecified: Secondary | ICD-10-CM | POA: Diagnosis not present

## 2021-06-08 DIAGNOSIS — N1832 Chronic kidney disease, stage 3b: Secondary | ICD-10-CM | POA: Diagnosis not present

## 2021-06-08 NOTE — Patient Outreach (Signed)
[facility]) Care Management  06/08/2021  [name] 02/10/1946 [id number]  EMMI-GENERAL DISCHARGE RED ON EMMI ALERT Day # 1 Date: 06/04/2021 Red Alert: No follow up appointment and change in routine medications.  RN spoke with patient concerning the above emmis. Pt report it is difficult for to see and she may have pushed the incorrect button.  RN verified all pending appointments with her providers and verified adherence to all prescribed medications. Pt has a supportive daughter and sufficient transportation to all medical appointments.  This emmi has been resolved.  Note: Pt is already enrolled into the [facility] diabetes program.   Raina Mina, RN Care Management Coordinator [place] Office [phone]

## 2021-06-09 ENCOUNTER — Other Ambulatory Visit: Payer: Self-pay

## 2021-06-09 ENCOUNTER — Encounter (INDEPENDENT_AMBULATORY_CARE_PROVIDER_SITE_OTHER): Payer: Medicare Other | Admitting: Ophthalmology

## 2021-06-09 DIAGNOSIS — H43813 Vitreous degeneration, bilateral: Secondary | ICD-10-CM

## 2021-06-09 DIAGNOSIS — I10 Essential (primary) hypertension: Secondary | ICD-10-CM | POA: Diagnosis not present

## 2021-06-09 DIAGNOSIS — E113513 Type 2 diabetes mellitus with proliferative diabetic retinopathy with macular edema, bilateral: Secondary | ICD-10-CM | POA: Diagnosis not present

## 2021-06-09 DIAGNOSIS — H35033 Hypertensive retinopathy, bilateral: Secondary | ICD-10-CM | POA: Diagnosis not present

## 2021-06-10 ENCOUNTER — Inpatient Hospital Stay: Payer: Medicare Other

## 2021-06-10 ENCOUNTER — Ambulatory Visit (HOSPITAL_COMMUNITY)
Admission: RE | Admit: 2021-06-10 | Discharge: 2021-06-10 | Disposition: A | Discharge status: Home / Self Care | Payer: Medicare Other | Source: Ambulatory Visit | Attending: Cardiology | Admitting: Cardiology

## 2021-06-10 ENCOUNTER — Inpatient Hospital Stay: Payer: Medicare Other | Attending: Hematology and Oncology

## 2021-06-10 VITALS — BP 144/48 | HR 66 | Temp 98.9°F | Resp 20

## 2021-06-10 DIAGNOSIS — D539 Nutritional anemia, unspecified: Secondary | ICD-10-CM | POA: Insufficient documentation

## 2021-06-10 DIAGNOSIS — Z9862 Peripheral vascular angioplasty status: Secondary | ICD-10-CM | POA: Insufficient documentation

## 2021-06-10 DIAGNOSIS — I739 Peripheral vascular disease, unspecified: Secondary | ICD-10-CM | POA: Insufficient documentation

## 2021-06-10 DIAGNOSIS — N1832 Chronic kidney disease, stage 3b: Secondary | ICD-10-CM

## 2021-06-10 LAB — CBC WITH DIFFERENTIAL/PLATELET
Abs Immature Granulocytes: 0.04 10*3/uL (ref 0.00–0.07)
Basophils Absolute: 0.1 10*3/uL (ref 0.0–0.1)
Basophils Relative: 1 %
Eosinophils Absolute: 0.1 10*3/uL (ref 0.0–0.5)
Eosinophils Relative: 1 %
HCT: 29.9 % — ABNORMAL LOW (ref 36.0–46.0)
Hemoglobin: 9.5 g/dL — ABNORMAL LOW (ref 12.0–15.0)
Immature Granulocytes: 0 %
Lymphocytes Relative: 21 %
Lymphs Abs: 1.9 10*3/uL (ref 0.7–4.0)
MCH: 28.7 pg (ref 26.0–34.0)
MCHC: 31.8 g/dL (ref 30.0–36.0)
MCV: 90.3 fL (ref 80.0–100.0)
Monocytes Absolute: 0.8 10*3/uL (ref 0.1–1.0)
Monocytes Relative: 9 %
Neutro Abs: 6.3 10*3/uL (ref 1.7–7.7)
Neutrophils Relative %: 68 %
Platelets: 270 10*3/uL (ref 150–400)
RBC: 3.31 MIL/uL — ABNORMAL LOW (ref 3.87–5.11)
RDW: 14.6 % (ref 11.5–15.5)
WBC: 9.2 10*3/uL (ref 4.0–10.5)
nRBC: 0 % (ref 0.0–0.2)

## 2021-06-10 MED ORDER — EPOETIN ALFA-EPBX 40000 UNIT/ML IJ SOLN
40000.0000 [IU] | Freq: Once | INTRAMUSCULAR | Status: AC
  Administered 2021-06-10: 40000 [IU] via SUBCUTANEOUS
  Filled 2021-06-10: qty 1

## 2021-06-10 NOTE — Patient Instructions (Signed)
Epoetin Alfa injection °What is this medication? °EPOETIN ALFA (e POE e tin AL fa) helps your body make more red blood cells. This medicine is used to treat anemia caused by chronic kidney disease, cancer chemotherapy, or HIV-therapy. It may also be used before surgery if you have anemia. °This medicine may be used for other purposes; ask your health care provider or pharmacist if you have questions. °COMMON BRAND NAME(S): Epogen, Procrit, Retacrit °What should I tell my care team before I take this medication? °They need to know if you have any of these conditions: °cancer °heart disease °high blood pressure °history of blood clots °history of stroke °low levels of folate, iron, or vitamin B12 in the blood °seizures °an unusual or allergic reaction to erythropoietin, albumin, benzyl alcohol, hamster proteins, other medicines, foods, dyes, or preservatives °pregnant or trying to get pregnant °breast-feeding °How should I use this medication? °This medicine is for injection into a vein or under the skin. It is usually given by a health care professional in a hospital or clinic setting. °If you get this medicine at home, you will be taught how to prepare and give this medicine. Use exactly as directed. Take your medicine at regular intervals. Do not take your medicine more often than directed. °It is important that you put your used needles and syringes in a special sharps container. Do not put them in a trash can. If you do not have a sharps container, call your pharmacist or healthcare provider to get one. °A special MedGuide will be given to you by the pharmacist with each prescription and refill. Be sure to read this information carefully each time. °Talk to your pediatrician regarding the use of this medicine in children. While this drug may be prescribed for selected conditions, precautions do apply. °Overdosage: If you think you have taken too much of this medicine contact a poison control center or emergency  room at once. °NOTE: This medicine is only for you. Do not share this medicine with others. °What if I miss a dose? °If you miss a dose, take it as soon as you can. If it is almost time for your next dose, take only that dose. Do not take double or extra doses. °What may interact with this medication? °Interactions have not been studied. °This list may not describe all possible interactions. Give your health care provider a list of all the medicines, herbs, non-prescription drugs, or dietary supplements you use. Also tell them if you smoke, drink alcohol, or use illegal drugs. Some items may interact with your medicine. °What should I watch for while using this medication? °Your condition will be monitored carefully while you are receiving this medicine. °You may need blood work done while you are taking this medicine. °This medicine may cause a decrease in vitamin B6. You should make sure that you get enough vitamin B6 while you are taking this medicine. Discuss the foods you eat and the vitamins you take with your health care professional. °What side effects may I notice from receiving this medication? °Side effects that you should report to your doctor or health care professional as soon as possible: °allergic reactions like skin rash, itching or hives, swelling of the face, lips, or tongue °seizures °signs and symptoms of a blood clot such as breathing problems; changes in vision; chest pain; severe, sudden headache; pain, swelling, warmth in the leg; trouble speaking; sudden numbness or weakness of the face, arm or leg °signs and symptoms of a stroke like   changes in vision; confusion; trouble speaking or understanding; severe headaches; sudden numbness or weakness of the face, arm or leg; trouble walking; dizziness; loss of balance or coordination °Side effects that usually do not require medical attention (report to your doctor or health care professional if they continue or are  bothersome): °chills °cough °dizziness °fever °headaches °joint pain °muscle cramps °muscle pain °nausea, vomiting °pain, redness, or irritation at site where injected °This list may not describe all possible side effects. Call your doctor for medical advice about side effects. You may report side effects to FDA at 1-800-FDA-1088. °Where should I keep my medication? °Keep out of the reach of children. °Store in a refrigerator between 2 and 8 degrees C (36 and 46 degrees F). Do not freeze or shake. Throw away any unused portion if using a single-dose vial. Multi-dose vials can be kept in the refrigerator for up to 21 days after the initial dose. Throw away unused medicine. °NOTE: This sheet is a summary. It may not cover all possible information. If you have questions about this medicine, talk to your doctor, pharmacist, or health care provider. °© 2022 [employer]vier/Gold Standard (2016-11-29 00:00:00) ° °

## 2021-06-11 ENCOUNTER — Ambulatory Visit (INDEPENDENT_AMBULATORY_CARE_PROVIDER_SITE_OTHER): Payer: Medicare Other | Admitting: Cardiovascular Disease

## 2021-06-11 ENCOUNTER — Encounter: Payer: Self-pay | Admitting: Cardiovascular Disease

## 2021-06-11 ENCOUNTER — Other Ambulatory Visit: Payer: Self-pay

## 2021-06-11 DIAGNOSIS — I739 Peripheral vascular disease, unspecified: Secondary | ICD-10-CM | POA: Diagnosis not present

## 2021-06-11 NOTE — Patient Instructions (Signed)
Medication Instructions:  ?Your physician recommends that you continue on your current medications as directed. Please refer to the Current Medication list given to you today. ? ?*If you need a refill on your cardiac medications before your next appointment, please call your pharmacy* ? ? ?Testing/Procedures: ?Your physician has requested that you have a lower extremity arterial duplex. This test is an ultrasound of the arteries in the legs. It looks at arterial blood flow in the legs. Allow one hour for Lower Arterial scans. There are no restrictions or special instructions ? ?Your physician has requested that you have an ankle brachial index (ABI). During this test an ultrasound and blood pressure cuff are used to evaluate the arteries that supply the arms and legs with blood. Allow thirty minutes for this exam. There are no restrictions or special instructions. ?To do in Feb/March 2024. These procedures will be done at [place] ? ? ?Follow-Up: ?At [facility], you and your health needs are our priority.  As part of our continuing mission to provide you with exceptional heart care, we have created designated Provider Care Teams.  These Care Teams include your primary Cardiologist (physician) and Advanced Practice Providers (APPs -  Physician Assistants and Nurse Practitioners) who all work together to provide you with the care you need, when you need it. ? ?We recommend signing up for the patient portal called "MyChart".  Sign up information is provided on this After Visit Summary.  MyChart is used to connect with patients for Virtual Visits (Telemedicine).  Patients are able to view lab/test results, encounter notes, upcoming appointments, etc.  Non-urgent messages can be sent to your provider as well.   ?To learn more about what you can do with MyChart, go to [email or web address].   ? ?Your next appointment:   ?12 month(s) ? ?The format for your next appointment:   ?In  Person ? ?Provider:   ?Quay Burow, MD ? ?

## 2021-06-11 NOTE — Assessment & Plan Note (Signed)
Ms. Yost returns today for follow-up of her PAD.  I did perform peripheral vascular intervention on her 03/24/2019 one of her left SFA.  Performed directional atherectomy followed by drug-coated balloon angioplasty with an excellent clinical result.  She presented back for discussion about staged right SFA intervention which I ultimately performed successfully on 05/31/2021.  Her Dopplers improved and her claudication did as well.  We will repeat Doppler studies in 12 months.  She did have Dopplers done late last year of her left leg which shows mild decrease in her ABI and increase in velocities suggesting early restenosis.  If she has recurrent disease when I see her back in a year we can always talk about reintervention. ?

## 2021-06-11 NOTE — Progress Notes (Signed)
? ? ? ?06/11/2021 ?[name]   ?[birth year]  ?[id number] ? ?Primary Physician Caren Macadam, MD ?Primary Cardiologist: Lorretta Harp MD [name], [place] ? ?HPI:  [name] is a 76 y.o.  mildly overweight married African-American female mother of 7 living daughter (son committed suicide), grandmother to 6 grandchildren referred by Dr. Tamala Julian, her cardiologist, for peripheral vascular evaluation.  I last saw her in the office 05/19/2021.  She is accompanied by her daughter [name] today.  In her younger years she worked for the [employer] and then for [employer].  Her risk factors include treated hypertension, diabetes and hyperlipidemia.  She is never had a heart attack or stroke.  She has had a diagnostic cath performed 04/17/2018 that showed some mild CAD.  She had COVID-19 back in January of this year recovered.  She does complain of lifestyle limiting claudication with Dopplers that were performed 01/29/2020 revealing a right ABI of 1.09 and a left of 0.89 with high-frequency signals in both SFAs. ? ?I performed peripheral angiography on her 03/23/2020 revealing 70% segmental mid left SFA stenosis.  I performed [facility] 1 directional atherectomy followed by drug-coated balloon angioplasty with excellent angiographic and clinical result.  Her most recent Doppler studies performed 12/16/2020 revealed a right ABI of 0.97 and a left of 1.01 with a widely patent left SFA.  She wishes to proceed with right SFA intervention. ? ?I performed peripheral vascular angiogram and intervention on her 05/31/2021 of her mid right SFA.  She did have an occluded anterior tibial on that side.  I performed directional atherectomy followed by drug-coated balloon angioplasty with excellent result.  Her follow-up lower extremity arterial Doppler studies performed 06/10/2021 showed marked improvement.  Her claudication on the right side has resolved.  She does complain of mild claudication on the left  however. ?  ? ? ?Current Meds  ?Medication Sig  ? aspirin 81 MG chewable tablet Chew 81 mg by mouth daily.  ? clopidogrel (PLAVIX) 75 MG tablet TAKE 1 TABLET(75 MG) BY MOUTH DAILY  ? dapagliflozin propanediol (FARXIGA) 10 MG TABS tablet Take 10 mg by mouth daily.  ? famotidine (PEPCID) 20 MG tablet TAKE 1 TABLET(20 MG) BY MOUTH TWICE DAILY AS NEEDED FOR HEARTBURN OR INDIGESTION  ? glipiZIDE (GLUCOTROL) 5 MG tablet Take 5 mg by mouth daily before breakfast.  ? nitroGLYCERIN (NITROSTAT) 0.4 MG SL tablet Place 1 tablet (0.4 mg total) under the tongue every 5 (five) minutes as needed for chest pain.  ? polyethylene glycol powder (GLYCOLAX/MIRALAX) 17 GM/SCOOP powder Take 17 g by mouth daily. (Patient taking differently: Take 17 g by mouth daily as needed for moderate constipation.)  ? quinapril-hydrochlorothiazide (ACCURETIC) 20-12.5 MG tablet TAKE 1 TABLET BY MOUTH DAILY  ? rosuvastatin (CRESTOR) 20 MG tablet TAKE 1 TABLET(20 MG) BY MOUTH DAILY  ? ?Current Facility-Administered Medications for the 06/11/21 encounter (Office Visit) with Lorretta Harp, MD  ?Medication  ? sodium chloride flush (NS) 0.9 % injection 3 mL  ?  ? ?Allergies  ?Allergen Reactions  ? Metformin And Related Diarrhea  ? ? ?Social History  ? ?Socioeconomic History  ? Marital status: Legally Separated  ?  Spouse name: Not on file  ? Number of children: 2  ? Years of education: Not on file  ? Highest education level: Not on file  ?Occupational History  ? Not on file  ?Tobacco Use  ? Smoking status: Never  ? Smokeless tobacco: Never  ?Vaping Use  ?  Vaping Use: Never used  ?Substance and Sexual Activity  ? Alcohol use: No  ? Drug use: No  ? Sexual activity: Not on file  ?Other Topics Concern  ? Not on file  ?Social History Narrative  ? Not on file  ? ?Social Determinants of Health  ? ?Financial Resource Strain: Not on file  ?Food Insecurity: No Food Insecurity  ? Worried About Charity fundraiser in the Last Year: Never true  ? Ran Out of Food in the  Last Year: Never true  ?Transportation Needs: No Transportation Needs  ? Lack of Transportation (Medical): No  ? Lack of Transportation (Non-Medical): No  ?Physical Activity: Not on file  ?Stress: Not on file  ?Social Connections: Not on file  ?Intimate Partner Violence: Not on file  ?  ? ?Review of Systems: ?General: negative for chills, fever, night sweats or weight changes.  ?Cardiovascular: negative for chest pain, dyspnea on exertion, edema, orthopnea, palpitations, paroxysmal nocturnal dyspnea or shortness of breath ?Dermatological: negative for rash ?Respiratory: negative for cough or wheezing ?Urologic: negative for hematuria ?Abdominal: negative for nausea, vomiting, diarrhea, bright red blood per rectum, melena, or hematemesis ?Neurologic: negative for visual changes, syncope, or dizziness ?All other systems reviewed and are otherwise negative except as noted above. ? ? ? ?Blood pressure (!) 142/50, pulse (!) 57, height 5\' 4"  (1.626 m), weight 131 lb (59.4 kg).  ?General appearance: alert and no distress ?Neck: no adenopathy, no carotid bruit, no JVD, supple, symmetrical, trachea midline, and thyroid not enlarged, symmetric, no tenderness/mass/nodules ?Lungs: clear to auscultation bilaterally ?Heart: regular rate and rhythm, S1, S2 normal, no murmur, click, rub or gallop ?Extremities: extremities normal, atraumatic, no cyanosis or edema ?Pulses: 2+ and symmetric ?Skin: Skin color, texture, turgor normal. No rashes or lesions ?Neurologic: Grossly normal ? ?EKG not performed today ? ?ASSESSMENT AND PLAN:  ? ?Claudication in peripheral vascular disease ([place]) ?[name] returns today for follow-up of her PAD.  I did perform peripheral vascular intervention on her 03/24/2019 one of her left SFA.  Performed directional atherectomy followed by drug-coated balloon angioplasty with an excellent clinical result.  She presented back for discussion about staged right SFA intervention which I ultimately performed  successfully on 05/31/2021.  Her Dopplers improved and her claudication did as well.  We will repeat Doppler studies in 12 months.  She did have Dopplers done late last year of her left leg which shows mild decrease in her ABI and increase in velocities suggesting early restenosis.  If she has recurrent disease when I see her back in a year we can always talk about reintervention. ? ? ? ? ?Lorretta Harp MD FACP,FACC,FAHA, FSCAI ?06/11/2021 ?9:21 AM ?

## 2021-06-25 DIAGNOSIS — E7800 Pure hypercholesterolemia, unspecified: Secondary | ICD-10-CM | POA: Diagnosis not present

## 2021-06-25 DIAGNOSIS — I10 Essential (primary) hypertension: Secondary | ICD-10-CM | POA: Diagnosis not present

## 2021-06-28 DIAGNOSIS — H903 Sensorineural hearing loss, bilateral: Secondary | ICD-10-CM | POA: Diagnosis not present

## 2021-06-28 DIAGNOSIS — H8112 Benign paroxysmal vertigo, left ear: Secondary | ICD-10-CM | POA: Diagnosis not present

## 2021-06-28 DIAGNOSIS — R42 Dizziness and giddiness: Secondary | ICD-10-CM | POA: Diagnosis not present

## 2021-07-01 ENCOUNTER — Other Ambulatory Visit: Payer: Self-pay | Admitting: *Deleted

## 2021-07-01 NOTE — Patient Outreach (Signed)
[facility]) Care Management Telephonic RN Care Manager Note   07/01/2021 Name:  [name]. Cousin MRN:  [id number] DOB:  1946-03-10  Summary: Vertigo pending ENT interventions Hearing issues pending hearing test w/ ENT Eye appointment pending 3/29 annual (diabetes) CBG ranging from 102-105 with last A1C 7.1 Feb  Recommendations/Changes made from today's visit: Will continue to encouraged adherence to the discussion plan of care and address any concerns or issues related. Will continue to encouraged daily CBG and report any high or low readings with precipitating symptoms.  Subjective: [name] is an 76 y.o. year old female who is a primary patient of Hagler, Fleet Contras, MD. The care management team was consulted for assistance with care management and/or care coordination needs.    Telephonic RN Care Manager completed Telephone Visit today.  Objective:   Medications Reviewed Today     Reviewed by Runell Gess, MD (Physician) on 06/11/21 at [phone]  Med List Status: <None>   Medication Order Taking? Sig Documenting Provider Last Dose Status Informant  aspirin 81 MG chewable tablet [id number] Yes Chew 81 mg by mouth daily. [provider] Taking Active Family Member  clopidogrel (PLAVIX) 75 MG tablet [id number] Yes TAKE 1 TABLET(75 MG) BY MOUTH DAILY Lyn Records, MD Taking Active Family Member  dapagliflozin propanediol (FARXIGA) 10 MG TABS tablet [id number] Yes Take 10 mg by mouth daily. [provider] Taking Active Family Member  famotidine (PEPCID) 20 MG tablet [id number] Yes TAKE 1 TABLET(20 MG) BY MOUTH TWICE DAILY AS NEEDED FOR HEARTBURN OR INDIGESTION Mirian Mo, MD Taking Active Family Member  glipiZIDE (GLUCOTROL) 5 MG tablet [id number] Yes Take 5 mg by mouth daily before breakfast. [provider] Taking Active Family Member  nitroGLYCERIN (NITROSTAT) 0.4 MG SL tablet [id number] Yes Place 1 tablet (0.4 mg total) under the tongue  every 5 (five) minutes as needed for chest pain. McDiarmid, Leighton Roach, MD Taking Active Family Member  polyethylene glycol powder (GLYCOLAX/MIRALAX) 17 GM/SCOOP powder [id number] Yes Take 17 g by mouth daily.  Patient taking differently: Take 17 g by mouth daily as needed for moderate constipation.   McDiarmid, Leighton Roach, MD Taking Active Family Member  quinapril-hydrochlorothiazide (ACCURETIC) 20-12.5 MG tablet [id number] Yes TAKE 1 TABLET BY MOUTH DAILY Lyn Records, MD Taking Active Family Member  rosuvastatin (CRESTOR) 20 MG tablet [id number] Yes TAKE 1 TABLET(20 MG) BY MOUTH DAILY Lyn Records, MD Taking Active Family Member  sodium chloride flush (NS) 0.9 % injection 3 mL [id number]   Runell Gess, MD  Active              SDOH:  (Social Determinants of Health) assessments and interventions performed:     Care Plan  Review of patient past medical history, allergies, medications, health status, including review of consultants reports, laboratory and other test data, was performed as part of comprehensive evaluation for care management services.   Care Plan : RN Care Manager Plan of Care  Updates made by Alejandro Mulling, RN since 07/01/2021 12:00 AM     Problem: THN-Knowledge Deficit related to Diabetes Management and Care Coordination Needs   Priority: High     Long-Range Goal: Development Plan of Care Management for Diabetes and Complex Care Coordination needs   Start Date: 02/24/2021  Expected End Date: 10/08/2021  This Visit's Progress: On track  Recent Progress: On track  Priority: High  Note:   Current Barriers:  Knowledge Deficits related to plan of care for management  of DMII  Chronic Disease Management support and education needs related to DMII   RNCM Clinical Goal(s):  Patient will verbalize understanding of plan for management of DMII as evidenced by Self report take all medications exactly as prescribed and will call provider for medication related questions  as evidenced by self report and  through collaboration with RN Care manager, provider, and care team.   Interventions: Inter-disciplinary care team collaboration (see longitudinal plan of care) Evaluation of current treatment plan related to  self management and patient's adherence to plan as established by provider   Diabetes Interventions:  (Status:  Goal on track:  Yes.) Long Term Goal Assessed patient's understanding of A1c goal: <6.5% Provided education to patient about basic DM disease process Reviewed medications with patient and discussed importance of medication adherence Discussed plans with patient for ongoing care management follow up and provided patient with direct contact information for care management team Provided patient with written educational materials related to hypo and hyperglycemia and importance of correct treatment Lab Results  Component Value Date   HGBA1C 9.7 (A) 10/01/2020   1/16: Pt continue to report awaiting on the glucometer that reads and reports pt's glucose readings. [facility] working with pt on obtaining this devices. 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 Pt's new medication provided (Farxiga 10 mg) and express that this medication has been very helpful. Last provider's office read in A1C was 6/22 at 9.7. Update 2/23: Pt recently discharged from the hospital in the care of family. Pt reports she has received her pending glucometer from her provider's pharmacy [facility]) but her daughters have been recording all her readings with no extremely high or low readings. Pt denies any acute symptoms related to her CBGs and continues to be monitoring by her provider's pharmacist. Pt reports she completed a HCPOA while inpt status and feels better with all her medications. Pt decline review of medications and has an appointment on next Thursday with provider to review at this time. Pt continues to have some  groin pain however tolerable and decline any interventions from her provider. Pt  unable to report any CBG reads due to her daughters documenting all readings and pt unable to read. RN requested pt to be able to report some of her AM readings on the next outreach call for closer monitoring for her diabetes management. Pt wishes to continue with monthly follow up calls. No acute needs at this time as pt has supportive family to intervene with any encountered issues or needs at this time. Will continue to review and reiterate on the plan of care and strongly encouraged adherence for prevention measures in managing her diabetes. 07/01/2021 Update:Pt reports she continue to do well since her discharged last month with no acute issues. States her CBG continue to be stable at 102-105 with no elevations from her norm. Reports Feb A1C was 7.1 and will be repeated in May/June. Pt understanding the goal to reduce to the low 6 and how dietary measures and exercises will contribute to improved readings. New issue currently with pt visiting a ENT provider due to her ongoing vertigo. Pt also states they will test her hearing due to symptoms of losing her hearing. Pt also reports an upcoming appointment with her eye provider on 3/29 for an annual. Pt verified she is being careful with her ambulation due to the vertigo and continue to have support in the home from her  daughter and granddaughter for ADLs and transportation. Pt adherent with taking all prescribed medications with no delays or problems reported today. Will continue to encouraged adherence with the discussed plan of care.   Patient Goals/Self-Care Activities: Take all medications as prescribed Attend all scheduled provider appointments Call pharmacy for medication refills 3-7 days in advance of running out of medications Attend church or other social activities Perform all self care activities independently  Perform IADL's (shopping, preparing meals,  housekeeping, managing finances) independently Call provider office for new concerns or questions  keep appointment with eye doctor check feet daily for cuts, sores or redness enter blood sugar readings and medication or insulin into daily log trim toenails straight across drink 6 to 8 glasses of water each day eat fish at least once per week fill half of plate with vegetables keep a food diary manage portion size set a realistic goal keep feet up while sitting wash and dry feet carefully every day wear comfortable, cotton socks wear comfortable, well-fitting shoes  Follow Up Plan:  Telephone follow up appointment with care management team member scheduled for:  April 2023 The patient has been provided with contact information for the care management team and has been advised to call with any health related questions or concerns.        Elliot Cousin, RN Care Management Coordinator [facility] Main Office [phone]

## 2021-07-05 DIAGNOSIS — R42 Dizziness and giddiness: Secondary | ICD-10-CM | POA: Diagnosis not present

## 2021-07-07 ENCOUNTER — Encounter (INDEPENDENT_AMBULATORY_CARE_PROVIDER_SITE_OTHER): Payer: Medicare Other | Admitting: Ophthalmology

## 2021-07-07 DIAGNOSIS — I10 Essential (primary) hypertension: Secondary | ICD-10-CM

## 2021-07-07 DIAGNOSIS — H43813 Vitreous degeneration, bilateral: Secondary | ICD-10-CM | POA: Diagnosis not present

## 2021-07-07 DIAGNOSIS — E113591 Type 2 diabetes mellitus with proliferative diabetic retinopathy without macular edema, right eye: Secondary | ICD-10-CM | POA: Diagnosis not present

## 2021-07-07 DIAGNOSIS — E113512 Type 2 diabetes mellitus with proliferative diabetic retinopathy with macular edema, left eye: Secondary | ICD-10-CM

## 2021-07-07 DIAGNOSIS — H35033 Hypertensive retinopathy, bilateral: Secondary | ICD-10-CM | POA: Diagnosis not present

## 2021-07-08 ENCOUNTER — Ambulatory Visit: Payer: Medicare Other

## 2021-07-08 ENCOUNTER — Other Ambulatory Visit: Payer: Medicare Other

## 2021-07-15 ENCOUNTER — Inpatient Hospital Stay (HOSPITAL_BASED_OUTPATIENT_CLINIC_OR_DEPARTMENT_OTHER): Payer: Medicare Other | Admitting: Hematology and Oncology

## 2021-07-15 ENCOUNTER — Encounter: Payer: Self-pay | Admitting: Hematology and Oncology

## 2021-07-15 ENCOUNTER — Inpatient Hospital Stay: Payer: Medicare Other | Attending: Hematology and Oncology

## 2021-07-15 ENCOUNTER — Inpatient Hospital Stay: Payer: Medicare Other

## 2021-07-15 ENCOUNTER — Other Ambulatory Visit: Payer: Self-pay

## 2021-07-15 VITALS — BP 130/50 | HR 57 | Temp 97.8°F | Resp 18 | Ht 64.0 in | Wt 134.2 lb

## 2021-07-15 DIAGNOSIS — N1832 Chronic kidney disease, stage 3b: Secondary | ICD-10-CM

## 2021-07-15 DIAGNOSIS — D631 Anemia in chronic kidney disease: Secondary | ICD-10-CM | POA: Insufficient documentation

## 2021-07-15 DIAGNOSIS — D539 Nutritional anemia, unspecified: Secondary | ICD-10-CM

## 2021-07-15 LAB — CBC WITH DIFFERENTIAL/PLATELET
Abs Immature Granulocytes: 0.03 10*3/uL (ref 0.00–0.07)
Basophils Absolute: 0 10*3/uL (ref 0.0–0.1)
Basophils Relative: 0 %
Eosinophils Absolute: 0.2 10*3/uL (ref 0.0–0.5)
Eosinophils Relative: 2 %
HCT: 30.5 % — ABNORMAL LOW (ref 36.0–46.0)
Hemoglobin: 9.6 g/dL — ABNORMAL LOW (ref 12.0–15.0)
Immature Granulocytes: 0 %
Lymphocytes Relative: 28 %
Lymphs Abs: 2.6 10*3/uL (ref 0.7–4.0)
MCH: 28.1 pg (ref 26.0–34.0)
MCHC: 31.5 g/dL (ref 30.0–36.0)
MCV: 89.2 fL (ref 80.0–100.0)
Monocytes Absolute: 0.5 10*3/uL (ref 0.1–1.0)
Monocytes Relative: 5 %
Neutro Abs: 5.9 10*3/uL (ref 1.7–7.7)
Neutrophils Relative %: 65 %
Platelets: 208 10*3/uL (ref 150–400)
RBC: 3.42 MIL/uL — ABNORMAL LOW (ref 3.87–5.11)
RDW: 14 % (ref 11.5–15.5)
WBC: 9.3 10*3/uL (ref 4.0–10.5)
nRBC: 0 % (ref 0.0–0.2)

## 2021-07-15 MED ORDER — EPOETIN ALFA-EPBX 40000 UNIT/ML IJ SOLN
40000.0000 [IU] | Freq: Once | INTRAMUSCULAR | Status: AC
  Administered 2021-07-15: 40000 [IU] via SUBCUTANEOUS
  Filled 2021-07-15: qty 1

## 2021-07-15 NOTE — Progress Notes (Signed)
? ?[facility] ?OFFICE PROGRESS NOTE ? ?Caren Macadam, MD ? ?ASSESSMENT & PLAN:  ?Deficiency anemia ?Overall, she has anemia of chronic renal failure ?She tolerated increased dose of Procrit 40,000 units once a month ?I plan to see her again in 6 months for further follow-up ? ?Chronic kidney disease, stage 3b ([place]) ?She has intermittent acute on chronic renal failure ?We discussed the importance of close monitoring and follow-up by nephrologist along with risk factor modifications ?Renal function is stable so far ? ?Orders Placed This Encounter  ?Procedures  ? CBC with Differential/Platelet  ?  Standing Status:   Standing  ?  Number of Occurrences:   65  ?  Standing Expiration Date:   07/16/2022  ? Iron and Iron Binding Capacity ([facility] only)  ?  Standing Status:   Future  ?  Standing Expiration Date:   07/16/2022  ? Ferritin  ?  Standing Status:   Future  ?  Standing Expiration Date:   07/16/2022  ? Vitamin B12  ?  Standing Status:   Future  ?  Standing Expiration Date:   07/16/2022  ? Erythropoietin  ?  Standing Status:   Future  ?  Standing Expiration Date:   07/16/2022  ? ? ?The total time spent in the appointment was 20 minutes encounter with patients including review of chart and various tests results, discussions about plan of care and coordination of care plan ? ? All questions were answered. The patient knows to call the clinic with any problems, questions or concerns. No barriers to learning was detected. ? ? ? Heath Lark, MD ?4/6/20234:12 PM ? ?INTERVAL HISTORY: ?Moe L. Crispo 76 y.o. female returns for follow-up of anemia chronic renal failure ?The patient has been getting Procrit injection every 4 weeks ?She was recently hospitalized for peripheral vascular disease and is currently on dual antiplatelet agents ?The patient denies any recent signs or symptoms of bleeding such as spontaneous epistaxis, hematuria or hematochezia. ? ?SUMMARY OF HEMATOLOGIC HISTORY: ?The patient was seen by  multiple different physicians in the past ?She was originally seen by Dr. Ralene Ok, and then Dr. Juliann Mule, and last seen here in 2016 by Dr. Benay Spice ?She has remote history of colon cancer diagnosed in 2006, stage II (T3 N0), status post a right colectomy 03/07/2005. She received adjuvant FOLFOX for 4 cycles followed by 8 cycles of 5-FU/leucovorin.  She had imaging study with CT scan of the abdomen and pelvis in 2019 which show no evidence of recurrence.  Her last colonoscopy was on July 14, 2016 which show no evidence of disease.  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  No other abnormalities were noted ?She also have history of meningioma and follow with neurosurgery for periodic imaging study.  Her last MRI of the brain was in Aug 11, 2015 which show stable meningioma ? ?She was found to have abnormal CBC from recent blood work.  On review of her blood work, it is noted that she has progressive anemia over the last few years, low was around 8.8 ?In July of this year, she had iron studies which showed normal iron storage.  She is noted to have progressive renal disease. ?Starting around 2018, she had progressive renal dysfunction, with serum creatinine fluctuate usually under 2 but in January of this year, her creatinine went up to as high as 2.2. ?Estimated EGFR put her at chronic kidney disease  stage III.  She has multiple cardiovascular risk factors including poorly controlled diabetes, peripheral vascular disease, chronic kidney disease, coronary artery disease and hypertension ? ?She denies recent chest pain on exertion, shortness of breath on minimal exertion, pre-syncopal episodes, or palpitations. ?She had not noticed any recent bleeding such as epistaxis, hematuria or hematochezia ?The patient denies over the counter NSAID ingestion. She is on antiplatelets agents. ? ?She denies any pica and eats a variety of diet. ?She had  received blood transfusion when she was diagnosed with colon cancer ?Her granddaughter noticed recurrent falls.  She denies generalized weakness.  She denies peripheral neuropathy from prior treatment or diabetes ?She is started on Epoetin injection for anemia chronic kidney disease on February 10, 2020 ? ?I have reviewed the past medical history, past surgical history, social history and family history with the patient and they are unchanged from previous note. ? ?ALLERGIES:  is allergic to metformin and related. ? ?MEDICATIONS:  ?Current Outpatient Medications  ?Medication Sig Dispense Refill  ? aspirin 81 MG chewable tablet Chew 81 mg by mouth daily.    ? clopidogrel (PLAVIX) 75 MG tablet TAKE 1 TABLET(75 MG) BY MOUTH DAILY 90 tablet 2  ? dapagliflozin propanediol (FARXIGA) 10 MG TABS tablet Take 10 mg by mouth daily.    ? famotidine (PEPCID) 20 MG tablet TAKE 1 TABLET(20 MG) BY MOUTH TWICE DAILY AS NEEDED FOR HEARTBURN OR INDIGESTION 90 tablet 2  ? glipiZIDE (GLUCOTROL) 5 MG tablet Take 5 mg by mouth daily before breakfast.    ? nitroGLYCERIN (NITROSTAT) 0.4 MG SL tablet Place 1 tablet (0.4 mg total) under the tongue every 5 (five) minutes as needed for chest pain. 25 tablet 11  ? polyethylene glycol powder (GLYCOLAX/MIRALAX) 17 GM/SCOOP powder Take 17 g by mouth daily. (Patient taking differently: Take 17 g by mouth daily as needed for moderate constipation.) 3350 g 1  ? quinapril-hydrochlorothiazide (ACCURETIC) 20-12.5 MG tablet TAKE 1 TABLET BY MOUTH DAILY 90 tablet 2  ? rosuvastatin (CRESTOR) 20 MG tablet TAKE 1 TABLET(20 MG) BY MOUTH DAILY 90 tablet 3  ? ?Current Facility-Administered Medications  ?Medication Dose Route Frequency Provider Last Rate Last Admin  ? sodium chloride flush (NS) 0.9 % injection 3 mL  3 mL Intravenous Q12H Lorretta Harp, MD      ?  ? ?REVIEW OF SYSTEMS:   ?Constitutional: Denies fevers, chills or night sweats ?Eyes: Denies blurriness of vision ?Ears, nose, mouth, throat, and  face: Denies mucositis or sore throat ?Respiratory: Denies cough, dyspnea or wheezes ?Cardiovascular: Denies palpitation, chest discomfort or lower extremity swelling ?Gastrointestinal:  Denies nausea, heartburn or change in bowel habits ?Skin: Denies abnormal skin rashes ?Lymphatics: Denies new lymphadenopathy or easy bruising ?Neurological:Denies numbness, tingling or new weaknesses ?Behavioral/Psych: Mood is stable, no new changes  ?All other systems were reviewed with the patient and are negative. ? ?PHYSICAL EXAMINATION: ?ECOG PERFORMANCE STATUS: 0 - Asymptomatic ? ?Vitals:  ? 07/15/21 1222  ?BP: (!) 130/50  ?Pulse: (!) 57  ?Resp: 18  ?Temp: 97.8 ?F (36.6 ?C)  ?SpO2: 100%  ? ?Filed Weights  ? 07/15/21 1222  ?Weight: 134 lb 3.2 oz (60.9 kg)  ? ? ?GENERAL:alert, no distress and comfortable ?NEURO: alert & oriented x 3 with fluent speech, no focal motor/sensory deficits ? ?LABORATORY DATA:  ?I have reviewed the data as listed ? ?   ?Component Value Date/Time  ? NA 143 06/01/2021 0303  ? NA 149 (H) 05/19/2021 1508  ? NA 143 05/15/2014 1154  ?  K 3.7 06/01/2021 0303  ? K 3.6 05/15/2014 1154  ? CL 111 06/01/2021 0303  ? CL 101 05/11/2012 1544  ? CO2 22 06/01/2021 0303  ? CO2 24 05/15/2014 1154  ? GLUCOSE 149 (H) 06/01/2021 0303  ? GLUCOSE 143 (H) 05/15/2014 1154  ? GLUCOSE 272 (H) 05/11/2012 1544  ? BUN 54 (H) 06/01/2021 0303  ? BUN 37 (H) 05/19/2021 1508  ? BUN 17.3 05/15/2014 1154  ? CREATININE 1.78 (H) 06/01/2021 0303  ? CREATININE 1.48 (H) 10/28/2020 1208  ? CREATININE 1.06 (H) 07/20/2015 1641  ? CREATININE 1.0 05/15/2014 1154  ? CALCIUM 8.6 (L) 06/01/2021 0303  ? CALCIUM 9.6 05/15/2014 1154  ? PROT 7.0 02/18/2021 1143  ? PROT 6.8 12/08/2020 1051  ? PROT 7.0 05/15/2014 1154  ? ALBUMIN 3.6 02/18/2021 1143  ? ALBUMIN 4.2 12/08/2020 1051  ? ALBUMIN 3.7 05/15/2014 1154  ? AST 17 02/18/2021 1143  ? AST 19 10/28/2020 1208  ? AST 13 05/15/2014 1154  ? ALT 11 02/18/2021 1143  ? ALT 15 10/28/2020 1208  ? ALT 9 05/15/2014  1154  ? ALKPHOS 63 02/18/2021 1143  ? ALKPHOS 94 05/15/2014 1154  ? BILITOT 0.7 02/18/2021 1143  ? BILITOT 0.6 12/08/2020 1051  ? BILITOT 0.7 10/28/2020 1208  ? BILITOT 0.68 05/15/2014 1154  ? GFRNONAA 30 (L) 02/21

## 2021-07-15 NOTE — Assessment & Plan Note (Signed)
She has intermittent acute on chronic renal failure We discussed the importance of close monitoring and follow-up by nephrologist along with risk factor modifications Renal function is stable so far 

## 2021-07-15 NOTE — Assessment & Plan Note (Signed)
Overall, she has anemia of chronic renal failure ?She tolerated increased dose of Procrit 40,000 units once a month ?I plan to see her again in 6 months for further follow-up ?

## 2021-07-26 DIAGNOSIS — H905 Unspecified sensorineural hearing loss: Secondary | ICD-10-CM | POA: Diagnosis not present

## 2021-07-27 DIAGNOSIS — E118 Type 2 diabetes mellitus with unspecified complications: Secondary | ICD-10-CM | POA: Diagnosis not present

## 2021-07-27 DIAGNOSIS — I10 Essential (primary) hypertension: Secondary | ICD-10-CM | POA: Diagnosis not present

## 2021-07-27 DIAGNOSIS — N1832 Chronic kidney disease, stage 3b: Secondary | ICD-10-CM | POA: Diagnosis not present

## 2021-07-27 DIAGNOSIS — E7800 Pure hypercholesterolemia, unspecified: Secondary | ICD-10-CM | POA: Diagnosis not present

## 2021-07-27 DIAGNOSIS — K219 Gastro-esophageal reflux disease without esophagitis: Secondary | ICD-10-CM | POA: Diagnosis not present

## 2021-07-29 ENCOUNTER — Emergency Department: Payer: Medicare Other

## 2021-07-29 ENCOUNTER — Emergency Department
Admission: EM | Admit: 2021-07-29 | Discharge: 2021-07-30 | Disposition: A | Discharge status: Home / Self Care | Payer: Medicare Other | Attending: Emergency Medicine | Admitting: Emergency Medicine

## 2021-07-29 ENCOUNTER — Other Ambulatory Visit: Payer: Self-pay

## 2021-07-29 DIAGNOSIS — R0789 Other chest pain: Secondary | ICD-10-CM | POA: Diagnosis not present

## 2021-07-29 DIAGNOSIS — Z20822 Contact with and (suspected) exposure to covid-19: Secondary | ICD-10-CM | POA: Insufficient documentation

## 2021-07-29 DIAGNOSIS — R6889 Other general symptoms and signs: Secondary | ICD-10-CM | POA: Diagnosis not present

## 2021-07-29 DIAGNOSIS — R531 Weakness: Secondary | ICD-10-CM | POA: Diagnosis not present

## 2021-07-29 DIAGNOSIS — R079 Chest pain, unspecified: Secondary | ICD-10-CM | POA: Diagnosis not present

## 2021-07-29 LAB — RESP PANEL BY RT-PCR (FLU A&B, COVID) ARPGX2
Influenza A by PCR: NEGATIVE
Influenza B by PCR: NEGATIVE
SARS Coronavirus 2 by RT PCR: NEGATIVE

## 2021-07-29 LAB — CBC WITH DIFFERENTIAL/PLATELET
Abs Immature Granulocytes: 0.01 10*3/uL (ref 0.00–0.07)
Basophils Absolute: 0.1 10*3/uL (ref 0.0–0.1)
Basophils Relative: 1 %
Eosinophils Absolute: 0.2 10*3/uL (ref 0.0–0.5)
Eosinophils Relative: 2 %
HCT: 33.7 % — ABNORMAL LOW (ref 36.0–46.0)
Hemoglobin: 10.4 g/dL — ABNORMAL LOW (ref 12.0–15.0)
Immature Granulocytes: 0 %
Lymphocytes Relative: 22 %
Lymphs Abs: 1.7 10*3/uL (ref 0.7–4.0)
MCH: 27.4 pg (ref 26.0–34.0)
MCHC: 30.9 g/dL (ref 30.0–36.0)
MCV: 88.9 fL (ref 80.0–100.0)
Monocytes Absolute: 0.5 10*3/uL (ref 0.1–1.0)
Monocytes Relative: 6 %
Neutro Abs: 5.4 10*3/uL (ref 1.7–7.7)
Neutrophils Relative %: 69 %
Platelets: 221 10*3/uL (ref 150–400)
RBC: 3.79 MIL/uL — ABNORMAL LOW (ref 3.87–5.11)
RDW: 14.6 % (ref 11.5–15.5)
WBC: 7.8 10*3/uL (ref 4.0–10.5)
nRBC: 0 % (ref 0.0–0.2)

## 2021-07-29 LAB — MAGNESIUM: Magnesium: 2.6 mg/dL — ABNORMAL HIGH (ref 1.7–2.4)

## 2021-07-29 LAB — COMPREHENSIVE METABOLIC PANEL
ALT: 13 U/L (ref 0–44)
AST: 23 U/L (ref 15–41)
Albumin: 3.9 g/dL (ref 3.5–5.0)
Alkaline Phosphatase: 63 U/L (ref 38–126)
Anion gap: 10 (ref 5–15)
BUN: 35 mg/dL — ABNORMAL HIGH (ref 8–23)
CO2: 26 mmol/L (ref 22–32)
Calcium: 9.2 mg/dL (ref 8.9–10.3)
Chloride: 103 mmol/L (ref 98–111)
Creatinine, Ser: 1.74 mg/dL — ABNORMAL HIGH (ref 0.44–1.00)
GFR, Estimated: 30 mL/min — ABNORMAL LOW (ref >60)
Glucose, Bld: 207 mg/dL — ABNORMAL HIGH (ref 70–99)
Potassium: 4 mmol/L (ref 3.5–5.1)
Sodium: 139 mmol/L (ref 135–145)
Total Bilirubin: 0.5 mg/dL (ref 0.3–1.2)
Total Protein: 7.4 g/dL (ref 6.5–8.1)

## 2021-07-29 LAB — TROPONIN I (HIGH SENSITIVITY): Troponin I (High Sensitivity): 8 ng/L (ref <18)

## 2021-07-29 MED ORDER — LACTATED RINGERS IV BOLUS
1000.0000 mL | Freq: Once | INTRAVENOUS | Status: AC
  Administered 2021-07-29: 1000 mL via INTRAVENOUS

## 2021-07-29 NOTE — ED Triage Notes (Signed)
Pt presents to ER via ems from home c/o weakness that started this morning after waking up.  Pt also had fainting spell, where she was found slumped over in her chair at her grand-daughters house.  Family then called ems.  Pt states she also had some intermittent dull feeling chest pain per ems.  Pt given 324 ASA w/ems.  Ems states CBG was normal.  Pt had arteries in BIL lower extremities "cleaned out" around 1 month ago.  Also has hx of cardiac stents.  Pt is A&O x4 at this time in NAD.   ?

## 2021-07-29 NOTE — ED Notes (Signed)
Pure wick placed on pt for urine collection.

## 2021-07-29 NOTE — ED Provider Notes (Signed)
? ?[facility] ?Provider Note ? ? ? Event Date/Time  ? First MD Initiated Contact with Patient 07/29/21 2159   ?  (approximate) ? ? ?History  ? ?Weakness ? ? ?HPI ? ?[name] is a 76 y.o. female who presents to the ED for evaluation of Weakness ?  ?I reviewed recent DC summary from 2/22 where patient had worsening claudication of lower extremity PAD and required angioplasty.  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 ?Patient lives at home with her granddaughter and typical ambulates independently. ? ?Patient presents to the ED from granddaughter's house by EMS for evaluation of increased generalized weakness just today.  Granddaughter provides majority of history after she arrives shortly after the patient.  Reports today she has been sleeping more than normal and generalized seems less active.  At one point this evening they found her slumped over in a chair fast asleep and was more difficult to arouse so they called 911.  After they arrived to the ED they report that she seems to be back at her baseline. ? ?Here in the ED, patient reports feeling a little bit weak all over and somewhat heavy all of her extremities without focal features.  Denies pain anywhere acutely.  Has chronic bilateral sciatica. ? ?Physical Exam  ? ?Triage Vital Signs: ?ED Triage Vitals  ?Enc Vitals Group  ?   BP 07/29/21 2205 (!) 157/65  ?   Pulse Rate 07/29/21 2205 (!) 57  ?   Resp 07/29/21 2205 17  ?   Temp 07/29/21 2205 98.9 ?F (37.2 ?C)  ?   Temp Source 07/29/21 2205 Oral  ?   SpO2 07/29/21 2205 100 %  ?   Weight 07/29/21 2206 134 lb (60.8 kg)  ?   Height 07/29/21 2206 '5\' 4"'$  (1.626 m)  ?   Head Circumference --   ?   Peak Flow --   ?   Pain Score 07/29/21 2205 6  ?   Pain Loc --   ?   Pain Edu? --   ?   Excl. in [place]? --   ? ? ?Most recent vital signs: ?Vitals:  ? 07/29/21 2205 07/29/21 2300  ?BP: (!) 157/65 (!) 125/55  ?Pulse: (!) 57 (!) 54  ?Resp: 17 10  ?Temp: 98.9 ?F (37.2 ?C)   ?SpO2: 100% 100%   ? ? ?General: Awake, no distress.  Pleasant and conversational full sentences.  Well-appearing.   ?CV:  Good peripheral perfusion. RRR ?Resp:  Normal effort.  ?Abd:  No distention.  Soft and benign ?MSK:  No deformity noted.  ?Neuro:  No focal deficits appreciated. Cranial nerves II through XII intact ?5/5 strength and sensation in all 4 extremities ?Other:   ? ? ?ED Results / Procedures / Treatments  ? ?Labs ?(all labs ordered are listed, but only abnormal results are displayed) ?Labs Reviewed  ?CBC WITH DIFFERENTIAL/PLATELET - Abnormal; Notable for the following components:  ?    Result Value  ? RBC 3.79 (*)   ? Hemoglobin 10.4 (*)   ? HCT 33.7 (*)   ? All other components within normal limits  ?COMPREHENSIVE METABOLIC PANEL - Abnormal; Notable for the following components:  ? BUN 35 (*)   ? Creatinine, Ser 1.74 (*)   ? GFR, Estimated 30 (*)   ? All other components within normal limits  ?MAGNESIUM - Abnormal; Notable for the following components:  ? Magnesium 2.6 (*)   ? All other components within normal  limits  ?RESP PANEL BY RT-PCR (FLU A&B, COVID) [id number]  ?URINALYSIS, ROUTINE W REFLEX MICROSCOPIC  ?TROPONIN I (HIGH SENSITIVITY)  ? ? ?EKG ?Sinus rhythm, rate of 60 bpm.  Normal axis and intervals.  No STEMI. ? ?RADIOLOGY ? ? ?Official radiology report(s): ?No results found. ? ?PROCEDURES and INTERVENTIONS: ? ?.1-3 Lead EKG Interpretation ?Performed by: Vladimir Crofts, MD ?Authorized by: Vladimir Crofts, MD  ? ?  Interpretation: normal   ?  ECG rate:  60 ?  ECG rate assessment: normal   ?  Rhythm: sinus rhythm   ?  Ectopy: none   ?  Conduction: normal   ? ?Medications  ?lactated ringers bolus 1,000 mL (1,000 mLs Intravenous New Bag/Given 07/29/21 2254)  ? ? ? ?IMPRESSION / MDM / ASSESSMENT AND PLAN / ED COURSE  ?I reviewed the triage vital signs and the nursing notes. ? ?76 year old female presents from home with generalized weakness.  She looks similarly well and has reassuring examination here in the ED.  No focal  weakness, signs of trauma or any vascular deficits.  Strong DP pulses to bilateral lower extremities without evidence of rethrombosis after her recent procedure.  Blood work is returning at the time of signout and is generally reassuring without evidence of leukocytosis, SIRS or sepsis.  Negative for flu and COVID and negative troponin.  We discussed IV fluids and reassessment with the possibility of returning back home with the family.  They are agreeable. ? ?Clinical Course as of 07/29/21 2317  ?Thu Jul 29, 2021  ?2245 Reassessed.  Daughter and granddaughter now at the bedside.  Patient lives with her granddaughter.  I obtained some additional history. [DS]  ?2307 Patient signed out to oncoming provider pending remainder of blood work and reassessment.  [DS]  ?  ?Clinical Course User Index ?[DS] Vladimir Crofts, MD  ? ? ? ?FINAL CLINICAL IMPRESSION(S) / ED DIAGNOSES  ? ?Final diagnoses:  ?Generalized weakness  ? ? ? ?Rx / DC Orders  ? ?ED Discharge Orders   ? ? None  ? ?  ? ? ? ?Note:  This document was prepared using Dragon voice recognition software and may include unintentional dictation errors. ?  ?Vladimir Crofts, MD ?07/29/21 2317 ? ?

## 2021-07-30 ENCOUNTER — Other Ambulatory Visit: Payer: Self-pay | Admitting: *Deleted

## 2021-07-30 LAB — URINALYSIS, ROUTINE W REFLEX MICROSCOPIC
Bilirubin Urine: NEGATIVE
Glucose, UA: >=500 mg/dL — AB
Hgb urine dipstick: NEGATIVE
Ketones, ur: NEGATIVE mg/dL
Leukocytes,Ua: NEGATIVE
Nitrite: NEGATIVE
Protein, ur: NEGATIVE mg/dL
Specific Gravity, Urine: 1.017 (ref 1.005–1.030)
pH: 6 (ref 5.0–8.0)

## 2021-07-30 LAB — TROPONIN I (HIGH SENSITIVITY): Troponin I (High Sensitivity): 7 ng/L (ref <18)

## 2021-07-30 NOTE — Discharge Instructions (Signed)
Take medicines daily as directed by your doctor.  Drink plenty of fluids daily.  Return to the ER for worsening symptoms, persistent vomiting, difficulty breathing or other concerns. ?

## 2021-07-30 NOTE — Patient Outreach (Signed)
?[facility]) Care Management ?Telephonic RN Care Manager Note ? ? ?07/30/2021 ?Name:  [name]. Gains MRN:  [id number] DOB:  02-Oct-1945 ? ?Summary: ?Pt reports CBG lingering around 110 with no acute issues relater to her diabetes however reports a visit to the ED on 4/20 for dehydration. Pt confirms an increase in her hydration and with follow up with her provider soon (daughter making this appointment). ? ?Recommendations/Changes made from today's visit: ?Will offer review of discharge information and strongly encouraged adherence with a follow up appointment with her provider. No other inquires or questions at this time. Will follow up next month with ongoing care management services related to DM management. ? ?Subjective: ?[name] is an 76 y.o. year old female who is a primary patient of Hagler, Apolonio Schneiders, MD. The care management team was consulted for assistance with care management and/or care coordination needs.   ? ?Telephonic RN Care Manager completed Telephone Visit today. ? ?Objective:  ? ?Medications Reviewed Today   ? ? Reviewed by Heath Lark, MD (Physician) on 07/15/21 at [place] List Status: <None>  ? ?Medication Order Taking? Sig Documenting Provider Last Dose Status Informant  ?aspirin 81 MG chewable tablet [id number] No Chew 81 mg by mouth daily. [provider] Taking Active Family Member  ?clopidogrel (PLAVIX) 75 MG tablet [id number] No TAKE 1 TABLET(75 MG) BY MOUTH DAILY Belva Crome, MD Taking Active Family Member  ?dapagliflozin propanediol (FARXIGA) 10 MG TABS tablet [id number] No Take 10 mg by mouth daily. [provider] Taking Active Family Member  ?famotidine (PEPCID) 20 MG tablet [id number] No TAKE 1 TABLET(20 MG) BY MOUTH TWICE DAILY AS NEEDED FOR HEARTBURN OR INDIGESTION [name], MD Taking Active Family Member  ?glipiZIDE (GLUCOTROL) 5 MG tablet [id number] No Take 5 mg by mouth daily before breakfast. [provider] Taking Active  Family Member  ?nitroGLYCERIN (NITROSTAT) 0.4 MG SL tablet [id number] No Place 1 tablet (0.4 mg total) under the tongue every 5 (five) minutes as needed for chest pain. McDiarmid, Blane Ohara, MD Taking Active Family Member  ?polyethylene glycol powder (GLYCOLAX/MIRALAX) 17 GM/SCOOP powder [id number] No Take 17 g by mouth daily.  ?Patient taking differently: Take 17 g by mouth daily as needed for moderate constipation.  ? McDiarmid, Blane Ohara, MD Taking Active Family Member  ?quinapril-hydrochlorothiazide (ACCURETIC) 20-12.5 MG tablet [id number] No TAKE 1 TABLET BY MOUTH DAILY Belva Crome, MD Taking Active Family Member  ?rosuvastatin (CRESTOR) 20 MG tablet [id number] No TAKE 1 TABLET(20 MG) BY MOUTH DAILY Belva Crome, MD Taking Active Family Member  ?sodium chloride flush (NS) 0.9 % injection 3 mL [id number]   Lorretta Harp, MD  Active   ? ?  ?  ? ?  ? ? ? ?SDOH:  (Social Determinants of Health) assessments and interventions performed:  ? ? ? ?Care Plan ? ?Review of patient past medical history, allergies, medications, health status, including review of consultants reports, laboratory and other test data, was performed as part of comprehensive evaluation for care management services.  ? ?Care Plan : RN Care Manager Plan of Care  ?Updates made by [name], RN since 07/30/2021 12:00 AM  ?  ? ?Problem: THN-Knowledge Deficit related to Diabetes Management and Care Coordination Needs   ?Priority: High  ?  ? ?Long-Range Goal: Development Plan of Care Management for Diabetes and Complex Care Coordination needs   ?Start Date: 02/24/2021  ?Expected End Date: 10/08/2021  ?This Visit's Progress: On track  ?  Recent Progress: On track  ?Priority: High  ?Note:   ?Current Barriers:  ?Knowledge Deficits related to plan of care for management of DMII  ?Chronic Disease Management support and education needs related to DMII  ? ?RNCM Clinical Goal(s):  ?Patient will verbalize understanding of plan for management of DMII as evidenced  by Self report ?take all medications exactly as prescribed and will call provider for medication related questions as evidenced by self report and  through collaboration with RN Care manager, provider, and care team.  ? ?Interventions: ?Inter-disciplinary care team collaboration (see longitudinal plan of care) ?Evaluation of current treatment plan related to  self management and patient's adherence to plan as established by provider ? ? ?Diabetes Interventions:  (Status:  Goal on track:  Yes.) Long Term Goal ?Assessed patient's understanding of A1c goal: <6.5% ?Provided education to patient about basic DM disease process ?Reviewed medications with patient and discussed importance of medication adherence ?Discussed plans with patient for ongoing care management follow up and provided patient with direct contact information for care management team ?Provided patient with written educational materials related to hypo and hyperglycemia and importance of correct treatment ?Lab Results  ?Component Value Date  ? HGBA1C 9.7 (A) 10/01/2020  ? 1/16: Pt continue to report awaiting on the glucometer that reads and reports pt's glucose readings. [facility] working [facility] on obtaining this devices. 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 Pt's new medication provided (Farxiga 10 mg) and express that this medication has been very helpful. Last provider's office read in A1C was 6/22 at 9.7. ? ?Update 2/23: Pt recently discharged from the hospital in the care of family. Pt reports she has received her pending glucometer from her provider's pharmacy [name]) but her daughters have been recording all her readings with no extremely high or low readings. Pt denies any acute symptoms related to her CBGs and continues to be monitoring by her provider's pharmacist. Pt reports she completed a HCPOA while inpt status and feels better with all her medications. Pt decline review of  medications and has an appointment on next Thursday with provider to review at this time. Pt continues to have some groin pain however tolerable and decline any interventions from her provider. Pt  unable to report any CBG reads due to her daughters documenting all readings and pt unable to read. RN requested pt to be able to report some of her AM readings on the next outreach call for closer monitoring for her diabetes management. Pt wishes to continue with monthly follow up calls. No acute needs at this time as pt has supportive family to intervene with any encountered issues or needs at this time. Will continue to review and reiterate on the plan of care and strongly encouraged adherence for prevention measures in managing her diabetes. ? ?07/01/2021 Update:Pt reports she continue to do well since her discharged last month with no acute issues. States her CBG continue to be stable at 102-105 with no elevations from her norm. Reports Feb A1C was 7.1 and will be repeated in May/June. Pt understanding the goal to reduce to the low 6 and how dietary measures and exercises will contribute to improved readings. New issue currently with pt visiting a ENT provider due to her ongoing vertigo. Pt also states they will test her hearing due to symptoms of losing her hearing. Pt also reports an upcoming appointment with her eye provider on 3/29 for  an annual. Pt verified she is being careful with her ambulation due to the vertigo and continue to have support in the home from her daughter and granddaughter for ADLs and transportation. Pt adherent with taking all prescribed medications with no delays or problems reported today. Will continue to encouraged adherence with the discussed plan of care.  ? ?07/30/2021 Update: Pt reports she continue to maintain daily CBG with this morning read at 110 with no acute readings since the last conversation however pt reports she is continue to communicate with the [facility] concerning her diabetes. Pt has indicated a ED visit on yesterday for dehydration/weakness. Informed to consume more H2O as adhered to the recommendations. RN inquired on visit scheduled with her prima

## 2021-07-30 NOTE — ED Provider Notes (Signed)
----------------------------------------- ?  1:09 AM on 07/30/2021 ?-----------------------------------------  ? ?Repeat troponin and UA unremarkable. Patient feels fine and is eager for discharge home. Strict return precautions given. Patient and family verbalize understanding and agree with plan of care. ?  ?Paulette Blanch, MD ?07/30/21 0421 ? ?

## 2021-08-04 ENCOUNTER — Encounter (INDEPENDENT_AMBULATORY_CARE_PROVIDER_SITE_OTHER): Payer: Medicare Other | Admitting: Ophthalmology

## 2021-08-04 DIAGNOSIS — E113591 Type 2 diabetes mellitus with proliferative diabetic retinopathy without macular edema, right eye: Secondary | ICD-10-CM | POA: Diagnosis not present

## 2021-08-04 DIAGNOSIS — I10 Essential (primary) hypertension: Secondary | ICD-10-CM | POA: Diagnosis not present

## 2021-08-04 DIAGNOSIS — H43813 Vitreous degeneration, bilateral: Secondary | ICD-10-CM | POA: Diagnosis not present

## 2021-08-04 DIAGNOSIS — E113512 Type 2 diabetes mellitus with proliferative diabetic retinopathy with macular edema, left eye: Secondary | ICD-10-CM

## 2021-08-04 DIAGNOSIS — H35033 Hypertensive retinopathy, bilateral: Secondary | ICD-10-CM | POA: Diagnosis not present

## 2021-08-05 ENCOUNTER — Ambulatory Visit: Payer: Medicare Other

## 2021-08-05 ENCOUNTER — Ambulatory Visit: Payer: Medicare Other | Admitting: Hematology and Oncology

## 2021-08-05 ENCOUNTER — Other Ambulatory Visit: Payer: Medicare Other

## 2021-08-12 ENCOUNTER — Inpatient Hospital Stay: Payer: Medicare Other | Attending: Hematology and Oncology

## 2021-08-12 ENCOUNTER — Ambulatory Visit: Payer: Medicare Other | Admitting: Hematology and Oncology

## 2021-08-12 ENCOUNTER — Inpatient Hospital Stay: Payer: Medicare Other

## 2021-08-12 ENCOUNTER — Other Ambulatory Visit: Payer: Self-pay

## 2021-08-12 VITALS — BP 145/69 | HR 59 | Temp 98.8°F | Resp 15

## 2021-08-12 DIAGNOSIS — D631 Anemia in chronic kidney disease: Secondary | ICD-10-CM | POA: Insufficient documentation

## 2021-08-12 DIAGNOSIS — N1832 Chronic kidney disease, stage 3b: Secondary | ICD-10-CM

## 2021-08-12 DIAGNOSIS — D539 Nutritional anemia, unspecified: Secondary | ICD-10-CM

## 2021-08-12 LAB — CBC WITH DIFFERENTIAL/PLATELET
Abs Immature Granulocytes: 0.02 10*3/uL (ref 0.00–0.07)
Basophils Absolute: 0 10*3/uL (ref 0.0–0.1)
Basophils Relative: 1 %
Eosinophils Absolute: 0.2 10*3/uL (ref 0.0–0.5)
Eosinophils Relative: 2 %
HCT: 32 % — ABNORMAL LOW (ref 36.0–46.0)
Hemoglobin: 10.2 g/dL — ABNORMAL LOW (ref 12.0–15.0)
Immature Granulocytes: 0 %
Lymphocytes Relative: 29 %
Lymphs Abs: 2.2 10*3/uL (ref 0.7–4.0)
MCH: 27.7 pg (ref 26.0–34.0)
MCHC: 31.9 g/dL (ref 30.0–36.0)
MCV: 87 fL (ref 80.0–100.0)
Monocytes Absolute: 0.4 10*3/uL (ref 0.1–1.0)
Monocytes Relative: 6 %
Neutro Abs: 4.7 10*3/uL (ref 1.7–7.7)
Neutrophils Relative %: 62 %
Platelets: 227 10*3/uL (ref 150–400)
RBC: 3.68 MIL/uL — ABNORMAL LOW (ref 3.87–5.11)
RDW: 14.2 % (ref 11.5–15.5)
WBC: 7.5 10*3/uL (ref 4.0–10.5)
nRBC: 0 % (ref 0.0–0.2)

## 2021-08-12 MED ORDER — EPOETIN ALFA-EPBX 40000 UNIT/ML IJ SOLN
40000.0000 [IU] | Freq: Once | INTRAMUSCULAR | Status: AC
  Administered 2021-08-12: 40000 [IU] via SUBCUTANEOUS
  Filled 2021-08-12: qty 1

## 2021-08-12 NOTE — Patient Instructions (Signed)
Epoetin Alfa injection ?What is this medication? ?EPOETIN ALFA (e POE e tin AL fa) helps your body make more red blood cells. This medicine is used to treat anemia caused by chronic kidney disease, cancer chemotherapy, or HIV-therapy. It may also be used before surgery if you have anemia. ?This medicine may be used for other purposes; ask your health care provider or pharmacist if you have questions. ?COMMON BRAND NAME(S): Epogen, Procrit, Retacrit ?What should I tell my care team before I take this medication? ?They need to know if you have any of these conditions: ?cancer ?heart disease ?high blood pressure ?history of blood clots ?history of stroke ?low levels of folate, iron, or vitamin B12 in the blood ?seizures ?an unusual or allergic reaction to erythropoietin, albumin, benzyl alcohol, hamster proteins, other medicines, foods, dyes, or preservatives ?pregnant or trying to get pregnant ?breast-feeding ?How should I use this medication? ?This medicine is for injection into a vein or under the skin. It is usually given by a health care professional in a hospital or clinic setting. ?If you get this medicine at home, you will be taught how to prepare and give this medicine. Use exactly as directed. Take your medicine at regular intervals. Do not take your medicine more often than directed. ?It is important that you put your used needles and syringes in a special sharps container. Do not put them in a trash can. If you do not have a sharps container, call your pharmacist or healthcare provider to get one. ?A special MedGuide will be given to you by the pharmacist with each prescription and refill. Be sure to read this information carefully each time. ?Talk to your pediatrician regarding the use of this medicine in children. While this drug may be prescribed for selected conditions, precautions do apply. ?Overdosage: If you think you have taken too much of this medicine contact a poison control center or emergency  room at once. ?NOTE: This medicine is only for you. Do not share this medicine with others. ?What if I miss a dose? ?If you miss a dose, take it as soon as you can. If it is almost time for your next dose, take only that dose. Do not take double or extra doses. ?What may interact with this medication? ?Interactions have not been studied. ?This list may not describe all possible interactions. Give your health care provider a list of all the medicines, herbs, non-prescription drugs, or dietary supplements you use. Also tell them if you smoke, drink alcohol, or use illegal drugs. Some items may interact with your medicine. ?What should I watch for while using this medication? ?Your condition will be monitored carefully while you are receiving this medicine. ?You may need blood work done while you are taking this medicine. ?This medicine may cause a decrease in vitamin B6. You should make sure that you get enough vitamin B6 while you are taking this medicine. Discuss the foods you eat and the vitamins you take with your health care professional. ?What side effects may I notice from receiving this medication? ?Side effects that you should report to your doctor or health care professional as soon as possible: ?allergic reactions like skin rash, itching or hives, swelling of the face, lips, or tongue ?seizures ?signs and symptoms of a blood clot such as breathing problems; changes in vision; chest pain; severe, sudden headache; pain, swelling, warmth in the leg; trouble speaking; sudden numbness or weakness of the face, arm or leg ?signs and symptoms of a stroke like   changes in vision; confusion; trouble speaking or understanding; severe headaches; sudden numbness or weakness of the face, arm or leg; trouble walking; dizziness; loss of balance or coordination ?Side effects that usually do not require medical attention (report to your doctor or health care professional if they continue or are  bothersome): ?chills ?cough ?dizziness ?fever ?headaches ?joint pain ?muscle cramps ?muscle pain ?nausea, vomiting ?pain, redness, or irritation at site where injected ?This list may not describe all possible side effects. Call your doctor for medical advice about side effects. You may report side effects to FDA at 1-800-FDA-1088. ?Where should I keep my medication? ?Keep out of the reach of children. ?Store in a refrigerator between 2 and 8 degrees C (36 and 46 degrees F). Do not freeze or shake. Throw away any unused portion if using a single-dose vial. Multi-dose vials can be kept in the refrigerator for up to 21 days after the initial dose. Throw away unused medicine. ?NOTE: This sheet is a summary. It may not cover all possible information. If you have questions about this medicine, talk to your doctor, pharmacist, or health care provider. ?? 2023 Elsevier/Gold Standard (2016-11-29 00:00:00) ? ?

## 2021-08-18 ENCOUNTER — Encounter (INDEPENDENT_AMBULATORY_CARE_PROVIDER_SITE_OTHER): Payer: Medicare Other | Admitting: Ophthalmology

## 2021-08-18 DIAGNOSIS — E113512 Type 2 diabetes mellitus with proliferative diabetic retinopathy with macular edema, left eye: Secondary | ICD-10-CM

## 2021-08-26 ENCOUNTER — Other Ambulatory Visit: Payer: Self-pay | Admitting: *Deleted

## 2021-08-26 NOTE — Patient Outreach (Signed)
[facility]) Care Management Telephonic RN Care Manager Note   08/26/2021 Name:  [name] MRN:  [id number] DOB:  [birth year]  Summary: Pt continue to do well and recovering from a recent eye surgery. Pt verified all her upcoming appointments with sufficient transportation. Aware of all her medications post-surgery and adherence to the medications discussed via Epic medications.  Recommendations/Changes made from today's visit: Will continue to encourage ongoing management of care related to her diabetes and follow the reviewed and discussed plan of care. No other needs presented at this time.  Subjective: [name] is an 76 y.o. year old female who is a primary patient of Hagler, Apolonio Schneiders, MD. The care management team was consulted for assistance with care management and/or care coordination needs.    Telephonic RN Care Manager completed Telephone Visit today.  Objective:   Medications Reviewed Today     Reviewed by Heath Lark, MD (Physician) on 07/15/21 at [place] List Status: <None>   Medication Order Taking? Sig Documenting Provider Last Dose Status Informant  aspirin 81 MG chewable tablet [id number] No Chew 81 mg by mouth daily. [provider] Taking Active Family Member  clopidogrel (PLAVIX) 75 MG tablet [id number] No TAKE 1 TABLET(75 MG) BY MOUTH DAILY Belva Crome, MD Taking Active Family Member  dapagliflozin propanediol (FARXIGA) 10 MG TABS tablet [id number] No Take 10 mg by mouth daily. [provider] Taking Active Family Member  famotidine (PEPCID) 20 MG tablet [id number] No TAKE 1 TABLET(20 MG) BY MOUTH TWICE DAILY AS NEEDED FOR HEARTBURN OR INDIGESTION [name], MD Taking Active Family Member  glipiZIDE (GLUCOTROL) 5 MG tablet [id number] No Take 5 mg by mouth daily before breakfast. [provider] Taking Active Family Member  nitroGLYCERIN (NITROSTAT) 0.4 MG SL tablet [id number] No Place 1 tablet (0.4 mg total)  under the tongue every 5 (five) minutes as needed for chest pain. McDiarmid, Blane Ohara, MD Taking Active Family Member  polyethylene glycol powder (GLYCOLAX/MIRALAX) 17 GM/SCOOP powder [id number] No Take 17 g by mouth daily.  Patient taking differently: Take 17 g by mouth daily as needed for moderate constipation.   McDiarmid, Blane Ohara, MD Taking Active Family Member  quinapril-hydrochlorothiazide (ACCURETIC) 20-12.5 MG tablet [id number] No TAKE 1 TABLET BY MOUTH DAILY Belva Crome, MD Taking Active Family Member  rosuvastatin (CRESTOR) 20 MG tablet [id number] No TAKE 1 TABLET(20 MG) BY MOUTH DAILY Belva Crome, MD Taking Active Family Member  sodium chloride flush (NS) 0.9 % injection 3 mL [id number]   Lorretta Harp, MD  Active              SDOH:  (Social Determinants of Health) assessments and interventions performed:     Care Plan  Review of patient past medical history, allergies, medications, health status, including review of consultants reports, laboratory and other test data, was performed as part of comprehensive evaluation for care management services.   Care Plan : RN Care Manager Plan of Care  Updates made by [name], RN since 08/26/2021 12:00 AM     Problem: THN-Knowledge Deficit related to Diabetes Management and Care Coordination Needs   Priority: High     Long-Range Goal: Development Plan of Care Management for Diabetes and Complex Care Coordination needs   Start Date: 02/24/2021  Expected End Date: 10/08/2021  This Visit's Progress: On track  Recent Progress: On track  Priority: High  Note:   Current Barriers:  Knowledge Deficits related to plan of  care for management of DMII  Chronic Disease Management support and education needs related to DMII   RNCM Clinical Goal(s):  Patient will verbalize understanding of plan for management of DMII as evidenced by Self report take all medications exactly as prescribed and will call provider for medication  related questions as evidenced by self report and  through collaboration with RN Care manager, provider, and care team.   Interventions: Inter-disciplinary care team collaboration (see longitudinal plan of care) Evaluation of current treatment plan related to  self management and patient's adherence to plan as established by provider   Diabetes Interventions:  (Status:  Goal on track:  Yes.) Long Term Goal Assessed patient's understanding of A1c goal: <6.5% Provided education to patient about basic DM disease process Reviewed medications with patient and discussed importance of medication adherence Discussed plans with patient for ongoing care management follow up and provided patient with direct contact information for care management team Provided patient with written educational materials related to hypo and hyperglycemia and importance of correct treatment Lab Results  Component Value Date   HGBA1C 9.7 (A) 10/01/2020   1/16: Pt continue to report awaiting on the glucometer that reads and reports pt's glucose readings. [facility] working with pt on obtaining this devices. 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 Pt's new medication provided (Farxiga 10 mg) and express that this medication has been very helpful. Last provider's office read in A1C was 6/22 at 9.7.  Update 2/23: Pt recently discharged from the hospital in the care of family. Pt reports she has received her pending glucometer from her provider's pharmacy [name]) but her daughters have been recording all her readings with no extremely high or low readings. Pt denies any acute symptoms related to her CBGs and continues to be monitoring by her provider's pharmacist. Pt reports she completed a HCPOA while inpt status and feels better with all her medications. Pt decline review of medications and has an appointment on next Thursday with provider to review at this time. Pt  continues to have some groin pain however tolerable and decline any interventions from her provider. Pt  unable to report any CBG reads due to her daughters documenting all readings and pt unable to read. RN requested pt to be able to report some of her AM readings on the next outreach call for closer monitoring for her diabetes management. Pt wishes to continue with monthly follow up calls. No acute needs at this time as pt has supportive family to intervene with any encountered issues or needs at this time. Will continue to review and reiterate on the plan of care and strongly encouraged adherence for prevention measures in managing her diabetes.  07/01/2021 Update:Pt reports she continue to do well since her discharged last month with no acute issues. States her CBG continue to be stable at 102-105 with no elevations from her norm. Reports Feb A1C was 7.1 and will be repeated in May/June. Pt understanding the goal to reduce to the low 6 and how dietary measures and exercises will contribute to improved readings. New issue currently with pt visiting a ENT provider due to her ongoing vertigo. Pt also states they will test her hearing due to symptoms of losing her hearing. Pt also reports an upcoming appointment with her eye provider on 3/29 for an annual. Pt verified she is being careful with her ambulation due to the vertigo and continue to have support  in the home from her daughter and granddaughter for ADLs and transportation. Pt adherent with taking all prescribed medications with no delays or problems reported today. Will continue to encouraged adherence with the discussed plan of care.   07/30/2021 Update: Pt reports she continue to maintain daily CBG with this morning read at 110 with no acute readings since the last conversation however pt reports she is continue to communicate with the [facility] concerning her diabetes. Pt has indicated a ED visit on yesterday for  dehydration/weakness. Informed to consume more H2O as adhered to the recommendations. RN inquired on visit scheduled with her primary as recommended. Daughter will call and arrange for pt to follow up with Dr. Mannie Stabile. No other needs or incidents reports and pt continues to receive assist from her daughter and granddaughter both live in the home with pt.   08/26/2021 Update:Pt reports she continue to do well after her recent left eye laser surgery (previously blind in this eye) and post op pt is having a lot of itching but aware not to rub the site to prevent risk of infections. Pt has a pending follow up appointment and sufficient transportation to the medical appointment. All medications pt is aware of the purpose ane adheres to all discussed on her discharge orders. Pt continue to report her CBG have been good but unable to indicated the exact readings but states she had to lower her A1C and CBGs in order to proceed with the planned surgery. Pt reports she continues to work with the provider's pharmacy [facility] for her ongoing pharmacy needs. No acute issues at this time as pt continue to do well.  Patient Goals/Self-Care Activities: Take all medications as prescribed Attend all scheduled provider appointments Call pharmacy for medication refills 3-7 days in advance of running out of medications Attend church or other social activities Perform all self care activities independently  Perform IADL's (shopping, preparing meals, housekeeping, managing finances) independently Call provider office for new concerns or questions  keep appointment with eye doctor check feet daily for cuts, sores or redness enter blood sugar readings and medication or insulin into daily log trim toenails straight across drink 6 to 8 glasses of water each day eat fish at least once per week fill half of plate with vegetables keep a food diary manage portion size set a realistic goal keep feet up while sitting wash  and dry feet carefully every day wear comfortable, cotton socks wear comfortable, well-fitting shoes  Follow Up Plan:  Telephone follow up appointment with care management team member scheduled for:  June 2023 The patient has been provided with contact information for the care management team and has been advised to call with any health related questions or concerns.        Raina Mina, RN Care Management Coordinator [place] Office [phone]

## 2021-08-31 ENCOUNTER — Encounter: Payer: Self-pay | Admitting: Internal Medicine

## 2021-08-31 DIAGNOSIS — E118 Type 2 diabetes mellitus with unspecified complications: Secondary | ICD-10-CM | POA: Diagnosis not present

## 2021-08-31 DIAGNOSIS — I10 Essential (primary) hypertension: Secondary | ICD-10-CM | POA: Diagnosis not present

## 2021-08-31 DIAGNOSIS — E7800 Pure hypercholesterolemia, unspecified: Secondary | ICD-10-CM | POA: Diagnosis not present

## 2021-09-01 ENCOUNTER — Encounter (INDEPENDENT_AMBULATORY_CARE_PROVIDER_SITE_OTHER): Payer: Medicare Other | Admitting: Ophthalmology

## 2021-09-01 DIAGNOSIS — I10 Essential (primary) hypertension: Secondary | ICD-10-CM

## 2021-09-01 DIAGNOSIS — E113591 Type 2 diabetes mellitus with proliferative diabetic retinopathy without macular edema, right eye: Secondary | ICD-10-CM

## 2021-09-01 DIAGNOSIS — H35033 Hypertensive retinopathy, bilateral: Secondary | ICD-10-CM

## 2021-09-01 DIAGNOSIS — H43813 Vitreous degeneration, bilateral: Secondary | ICD-10-CM

## 2021-09-01 DIAGNOSIS — E113512 Type 2 diabetes mellitus with proliferative diabetic retinopathy with macular edema, left eye: Secondary | ICD-10-CM | POA: Diagnosis not present

## 2021-09-02 ENCOUNTER — Telehealth: Payer: Self-pay | Admitting: Cardiovascular Disease

## 2021-09-02 NOTE — Telephone Encounter (Signed)
Patient's daughter states patient has been having leg pain on and off for the past month. He also states the patient has been a little "off-balance." Denies swelling or any other symptoms. I scheduled the pain for 5/26 with Dr. [name].

## 2021-09-02 NOTE — Telephone Encounter (Signed)
Called patient daughter back- spoke with her and patient. They stated they would like to see Dr.Berry as patient was having bilateral leg pains, she has been having them for a while- but it has worsened. No swelling, no redness, no skin changes- they did advise she is "off balance" because of the pain. She mostly has the pain when she is up walking around and laying in bed. Patient was scheduled to see Dr.Berry on 05/26 at 9:00 AM. Advised them to keep this appointment, but I would route to MD/RN to make aware for upcoming appointment.   Patient and daughter verbalized understanding, thankful for call back.

## 2021-09-03 ENCOUNTER — Telehealth: Payer: Self-pay

## 2021-09-03 ENCOUNTER — Ambulatory Visit: Payer: Medicare Other | Admitting: Cardiovascular Disease

## 2021-09-03 NOTE — Telephone Encounter (Signed)
Attempted to call pt twice. Both times pt's phone rings busy.

## 2021-09-09 ENCOUNTER — Other Ambulatory Visit: Payer: Self-pay

## 2021-09-09 ENCOUNTER — Inpatient Hospital Stay: Payer: Medicare Other

## 2021-09-09 ENCOUNTER — Inpatient Hospital Stay: Payer: Medicare Other | Attending: Hematology and Oncology

## 2021-09-09 DIAGNOSIS — D631 Anemia in chronic kidney disease: Secondary | ICD-10-CM | POA: Diagnosis not present

## 2021-09-09 DIAGNOSIS — N1832 Chronic kidney disease, stage 3b: Secondary | ICD-10-CM | POA: Diagnosis not present

## 2021-09-09 DIAGNOSIS — D539 Nutritional anemia, unspecified: Secondary | ICD-10-CM

## 2021-09-09 LAB — CBC WITH DIFFERENTIAL/PLATELET
Abs Immature Granulocytes: 0.03 10*3/uL (ref 0.00–0.07)
Basophils Absolute: 0 10*3/uL (ref 0.0–0.1)
Basophils Relative: 1 %
Eosinophils Absolute: 0.1 10*3/uL (ref 0.0–0.5)
Eosinophils Relative: 2 %
HCT: 35.6 % — ABNORMAL LOW (ref 36.0–46.0)
Hemoglobin: 11.4 g/dL — ABNORMAL LOW (ref 12.0–15.0)
Immature Granulocytes: 0 %
Lymphocytes Relative: 35 %
Lymphs Abs: 2.6 10*3/uL (ref 0.7–4.0)
MCH: 27.8 pg (ref 26.0–34.0)
MCHC: 32 g/dL (ref 30.0–36.0)
MCV: 86.8 fL (ref 80.0–100.0)
Monocytes Absolute: 0.5 10*3/uL (ref 0.1–1.0)
Monocytes Relative: 7 %
Neutro Abs: 4.1 10*3/uL (ref 1.7–7.7)
Neutrophils Relative %: 55 %
Platelets: 215 10*3/uL (ref 150–400)
RBC: 4.1 MIL/uL (ref 3.87–5.11)
RDW: 14.6 % (ref 11.5–15.5)
WBC: 7.4 10*3/uL (ref 4.0–10.5)
nRBC: 0 % (ref 0.0–0.2)

## 2021-09-09 LAB — IRON AND IRON BINDING CAPACITY (CC-WL,HP ONLY)
Iron: 87 ug/dL (ref 28–170)
Saturation Ratios: 24 % (ref 10.4–31.8)
TIBC: 364 ug/dL (ref 250–450)
UIBC: 277 ug/dL (ref 148–442)

## 2021-09-09 LAB — VITAMIN B12: Vitamin B-12: 327 pg/mL (ref 180–914)

## 2021-09-09 LAB — FERRITIN: Ferritin: 41 ng/mL (ref 11–307)

## 2021-09-11 LAB — ERYTHROPOIETIN: Erythropoietin: 10 m[IU]/mL (ref 2.6–18.5)

## 2021-09-14 ENCOUNTER — Ambulatory Visit: Payer: Medicare Other | Admitting: Cardiovascular Disease

## 2021-09-22 DIAGNOSIS — H40053 Ocular hypertension, bilateral: Secondary | ICD-10-CM | POA: Diagnosis not present

## 2021-09-24 ENCOUNTER — Other Ambulatory Visit: Payer: Self-pay | Admitting: *Deleted

## 2021-09-24 DIAGNOSIS — E118 Type 2 diabetes mellitus with unspecified complications: Secondary | ICD-10-CM | POA: Diagnosis not present

## 2021-09-24 DIAGNOSIS — E7800 Pure hypercholesterolemia, unspecified: Secondary | ICD-10-CM | POA: Diagnosis not present

## 2021-09-24 DIAGNOSIS — I10 Essential (primary) hypertension: Secondary | ICD-10-CM | POA: Diagnosis not present

## 2021-09-24 NOTE — Patient Outreach (Signed)
[facility]) Care Management Telephonic RN Care Manager Note   09/24/2021 Name:  Amariss Detamore. Struckman MRN:  [id number] DOB:  1945/06/23  Summary: Pt remains adherent to the plan of care with reported glucose readings 119 in the AM. New hearing aids and recent laser surgery to her left eye with ongoing recovery.  No acute needs at this time while awaiting all follow up appointments over the next month.  Recommendations/Changes made from today's visit: Will continue to encouraged adherence with the discussed plan of care and follow up next month on pt's progress with ongoing management of care.  Subjective: Baljit L. Salah is an 76 y.o. year old female who is a primary patient of Hagler, Apolonio Schneiders, MD. The care management team was consulted for assistance with care management and/or care coordination needs.    Telephonic RN Care Manager completed Telephone Visit today.  Objective:   Medications Reviewed Today     Reviewed by [name], RN (Registered Nurse) on 09/24/21 at 1127  Med List Status: <None>   Medication Order Taking? Sig Documenting Provider Last Dose Status Informant  aspirin 81 MG chewable tablet [id number] Yes Chew 81 mg by mouth daily. [provider] Taking Active Family Member  clopidogrel (PLAVIX) 75 MG tablet [id number] Yes TAKE 1 TABLET(75 MG) BY MOUTH DAILY Belva Crome, MD Taking Active Family Member  dapagliflozin propanediol (FARXIGA) 10 MG TABS tablet [id number] Yes Take 10 mg by mouth daily. [provider] Taking Active Family Member  famotidine (PEPCID) 20 MG tablet [id number] Yes TAKE 1 TABLET(20 MG) BY MOUTH TWICE DAILY AS NEEDED FOR HEARTBURN OR INDIGESTION [name], MD Taking Active Family Member  glipiZIDE (GLUCOTROL) 5 MG tablet [id number] Yes Take 5 mg by mouth daily before breakfast. [provider] Taking Active Family Member  nitroGLYCERIN (NITROSTAT) 0.4 MG SL tablet [id number] Yes Place 1 tablet (0.4 mg  total) under the tongue every 5 (five) minutes as needed for chest pain. McDiarmid, Blane Ohara, MD Taking Active Family Member  polyethylene glycol powder (GLYCOLAX/MIRALAX) 17 GM/SCOOP powder [id number] Yes Take 17 g by mouth daily.  Patient taking differently: Take 17 g by mouth daily as needed for moderate constipation.   McDiarmid, Blane Ohara, MD Taking Active Family Member  quinapril-hydrochlorothiazide (ACCURETIC) 20-12.5 MG tablet [id number] Yes TAKE 1 TABLET BY MOUTH DAILY Belva Crome, MD Taking Active Family Member  rosuvastatin (CRESTOR) 20 MG tablet [id number] Yes TAKE 1 TABLET(20 MG) BY MOUTH DAILY Belva Crome, MD Taking Active Family Member  sodium chloride flush (NS) 0.9 % injection 3 mL [id number]   Lorretta Harp, MD  Active              SDOH:  (Social Determinants of Health) assessments and interventions performed:     Care Plan  Review of patient past medical history, allergies, medications, health status, including review of consultants reports, laboratory and other test data, was performed as part of comprehensive evaluation for care management services.   Care Plan : RN Care Manager Plan of Care  Updates made by [name], RN since 09/24/2021 12:00 AM     Problem: THN-Knowledge Deficit related to Diabetes Management and Care Coordination Needs   Priority: High     Long-Range Goal: Development Plan of Care Management for Diabetes and Complex Care Coordination needs   Start Date: 02/24/2021  Expected End Date: 03/10/2022  This Visit's Progress: On track  Recent Progress: On track  Priority: High  Note:  Current Barriers:  Knowledge Deficits related to plan of care for management of DMII  Chronic Disease Management support and education needs related to DMII   RNCM Clinical Goal(s):  Patient will verbalize understanding of plan for management of DMII as evidenced by Self report take all medications exactly as prescribed and will call provider for  medication related questions as evidenced by self report and  through collaboration with RN Care manager, provider, and care team.   Interventions: Inter-disciplinary care team collaboration (see longitudinal plan of care) Evaluation of current treatment plan related to  self management and patient's adherence to plan as established by provider   Diabetes Interventions:  (Status:  Goal on track:  Yes.) Long Term Goal Assessed patient's understanding of A1c goal: <6.5% Provided education to patient about basic DM disease process Reviewed medications with patient and discussed importance of medication adherence Discussed plans with patient for ongoing care management follow up and provided patient with direct contact information for care management team Provided patient with written educational materials related to hypo and hyperglycemia and importance of correct treatment Lab Results  Component Value Date   HGBA1C 9.7 (A) 10/01/2020   1/16: Pt continue to report awaiting on the glucometer that reads and reports pt's glucose readings. [facility] working with pt on obtaining this devices. 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 Pt's new medication provided (Farxiga 10 mg) and express that this medication has been very helpful. Last provider's office read in A1C was 6/22 at 9.7.  Update 2/23: Pt recently discharged from the hospital in the care of family. Pt reports she has received her pending glucometer from her provider's pharmacy [name]) but her daughters have been recording all her readings with no extremely high or low readings. Pt denies any acute symptoms related to her CBGs and continues to be monitoring by her provider's pharmacist. Pt reports she completed a HCPOA while inpt status and feels better with all her medications. Pt decline review of medications and has an appointment on next Thursday with provider to review at this time.  Pt continues to have some groin pain however tolerable and decline any interventions from her provider. Pt  unable to report any CBG reads due to her daughters documenting all readings and pt unable to read. RN requested pt to be able to report some of her AM readings on the next outreach call for closer monitoring for her diabetes management. Pt wishes to continue with monthly follow up calls. No acute needs at this time as pt has supportive family to intervene with any encountered issues or needs at this time. Will continue to review and reiterate on the plan of care and strongly encouraged adherence for prevention measures in managing her diabetes.  07/01/2021 Update:Pt reports she continue to do well since her discharged last month with no acute issues. States her CBG continue to be stable at 102-105 with no elevations from her norm. Reports Feb A1C was 7.1 and will be repeated in May/June. Pt understanding the goal to reduce to the low 6 and how dietary measures and exercises will contribute to improved readings. New issue currently with pt visiting a ENT provider due to her ongoing vertigo. Pt also states they will test her hearing due to symptoms of losing her hearing. Pt also reports an upcoming appointment with her eye provider on 3/29 for an annual. Pt verified she is being careful with her ambulation  due to the vertigo and continue to have support in the home from her daughter and granddaughter for ADLs and transportation. Pt adherent with taking all prescribed medications with no delays or problems reported today. Will continue to encouraged adherence with the discussed plan of care.   07/30/2021 Update: Pt reports she continue to maintain daily CBG with this morning read at 110 with no acute readings since the last conversation however pt reports she is continue to communicate with the [facility] concerning her diabetes. Pt has indicated a ED visit on yesterday for  dehydration/weakness. Informed to consume more H2O as adhered to the recommendations. RN inquired on visit scheduled with her primary as recommended. Daughter will call and arrange for pt to follow up with Dr. Mannie Stabile. No other needs or incidents reports and pt continues to receive assist from her daughter and granddaughter both live in the home with pt.   08/26/2021 Update:Pt reports she continue to do well after her recent left eye laser surgery (previously blind in this eye) and post op pt is having a lot of itching but aware not to rub the site to prevent risk of infections. Pt has a pending follow up appointment and sufficient transportation to the medical appointment. All medications pt is aware of the purpose ane adheres to all discussed on her discharge orders. Pt continue to report her CBG have been good but unable to indicated the exact readings but states she had to lower her A1C and CBGs in order to proceed with the planned surgery. Pt reports she continues to work with the provider's pharmacy [facility] for her ongoing pharmacy needs. No acute issues at this time as pt continue to do well.  09/24/2021 Update: Spoke with pt today who reports her AM CBG around 119 with no acute readings since the last conversation. States she will undergo another A1C hopefully over the next month with her provider. Recent laser eye surgery was a success however some residual eye pressure and pt administering medicated eye gtts and will follow up with the surgeon next week (Dr. Rodman Key) and her primary in August. Pt has a very good support system with her daughter and may be moving back to [place] over the next month. Pt indicates it will be Arbutus Leas (daughter) pt will be living with possible move date next month. Pt  reports follows remains good with her oncologist (pending f/u next month) and recent test indicated pt needs hearing aids and she has received and actively using the hearing aids with no additional  issues. Plan of care review and discussed with all ongoing interventions as pt remains receptive to all discussed and will continue to be adherent with these interventions.  Patient Goals/Self-Care Activities: Take all medications as prescribed Attend all scheduled provider appointments Call pharmacy for medication refills 3-7 days in advance of running out of medications Attend church or other social activities Perform all self care activities independently  Perform IADL's (shopping, preparing meals, housekeeping, managing finances) independently Call provider office for new concerns or questions  keep appointment with eye doctor check feet daily for cuts, sores or redness enter blood sugar readings and medication or insulin into daily log trim toenails straight across drink 6 to 8 glasses of water each day eat fish at least once per week fill half of plate with vegetables keep a food diary manage portion size set a realistic goal keep feet up while sitting wash and dry feet carefully every day wear comfortable,  cotton socks wear comfortable, well-fitting shoes  Follow Up Plan:  Telephone follow up appointment with care management team member scheduled for:  July 2023 The patient has been provided with contact information for the care management team and has been advised to call with any health related questions or concerns.       Raina Mina, RN Care Management Coordinator [place] Office [phone]

## 2021-09-30 ENCOUNTER — Encounter (INDEPENDENT_AMBULATORY_CARE_PROVIDER_SITE_OTHER): Payer: Medicare Other | Admitting: Ophthalmology

## 2021-09-30 DIAGNOSIS — E113512 Type 2 diabetes mellitus with proliferative diabetic retinopathy with macular edema, left eye: Secondary | ICD-10-CM

## 2021-09-30 DIAGNOSIS — I10 Essential (primary) hypertension: Secondary | ICD-10-CM | POA: Diagnosis not present

## 2021-09-30 DIAGNOSIS — H35033 Hypertensive retinopathy, bilateral: Secondary | ICD-10-CM

## 2021-09-30 DIAGNOSIS — H43813 Vitreous degeneration, bilateral: Secondary | ICD-10-CM | POA: Diagnosis not present

## 2021-09-30 DIAGNOSIS — E113591 Type 2 diabetes mellitus with proliferative diabetic retinopathy without macular edema, right eye: Secondary | ICD-10-CM

## 2021-10-07 ENCOUNTER — Inpatient Hospital Stay: Payer: Medicare Other

## 2021-10-07 ENCOUNTER — Other Ambulatory Visit: Payer: Self-pay

## 2021-10-07 VITALS — BP 125/50 | HR 92 | Temp 98.3°F | Resp 16

## 2021-10-07 DIAGNOSIS — N1832 Chronic kidney disease, stage 3b: Secondary | ICD-10-CM | POA: Diagnosis not present

## 2021-10-07 DIAGNOSIS — D539 Nutritional anemia, unspecified: Secondary | ICD-10-CM

## 2021-10-07 DIAGNOSIS — D631 Anemia in chronic kidney disease: Secondary | ICD-10-CM | POA: Diagnosis not present

## 2021-10-07 LAB — CBC WITH DIFFERENTIAL/PLATELET
Abs Immature Granulocytes: 0.02 10*3/uL (ref 0.00–0.07)
Basophils Absolute: 0 10*3/uL (ref 0.0–0.1)
Basophils Relative: 1 %
Eosinophils Absolute: 0.1 10*3/uL (ref 0.0–0.5)
Eosinophils Relative: 2 %
HCT: 31.6 % — ABNORMAL LOW (ref 36.0–46.0)
Hemoglobin: 10.1 g/dL — ABNORMAL LOW (ref 12.0–15.0)
Immature Granulocytes: 0 %
Lymphocytes Relative: 28 %
Lymphs Abs: 2.1 10*3/uL (ref 0.7–4.0)
MCH: 28.1 pg (ref 26.0–34.0)
MCHC: 32 g/dL (ref 30.0–36.0)
MCV: 87.8 fL (ref 80.0–100.0)
Monocytes Absolute: 0.5 10*3/uL (ref 0.1–1.0)
Monocytes Relative: 7 %
Neutro Abs: 4.8 10*3/uL (ref 1.7–7.7)
Neutrophils Relative %: 62 %
Platelets: 192 10*3/uL (ref 150–400)
RBC: 3.6 MIL/uL — ABNORMAL LOW (ref 3.87–5.11)
RDW: 14.8 % (ref 11.5–15.5)
WBC: 7.6 10*3/uL (ref 4.0–10.5)
nRBC: 0 % (ref 0.0–0.2)

## 2021-10-07 MED ORDER — EPOETIN ALFA-EPBX 40000 UNIT/ML IJ SOLN
40000.0000 [IU] | Freq: Once | INTRAMUSCULAR | Status: AC
  Administered 2021-10-07: 40000 [IU] via SUBCUTANEOUS
  Filled 2021-10-07: qty 1

## 2021-10-07 MED ORDER — EPOETIN ALFA-EPBX 10000 UNIT/ML IJ SOLN
40000.0000 [IU] | Freq: Once | INTRAMUSCULAR | Status: DC
  Filled 2021-10-07: qty 4

## 2021-10-07 NOTE — Patient Instructions (Signed)
Epoetin Alfa injection ?What is this medication? ?EPOETIN ALFA (e POE e tin AL fa) helps your body make more red blood cells. This medicine is used to treat anemia caused by chronic kidney disease, cancer chemotherapy, or HIV-therapy. It may also be used before surgery if you have anemia. ?This medicine may be used for other purposes; ask your health care provider or pharmacist if you have questions. ?COMMON BRAND NAME(S): Epogen, Procrit, Retacrit ?What should I tell my care team before I take this medication? ?They need to know if you have any of these conditions: ?cancer ?heart disease ?high blood pressure ?history of blood clots ?history of stroke ?low levels of folate, iron, or vitamin B12 in the blood ?seizures ?an unusual or allergic reaction to erythropoietin, albumin, benzyl alcohol, hamster proteins, other medicines, foods, dyes, or preservatives ?pregnant or trying to get pregnant ?breast-feeding ?How should I use this medication? ?This medicine is for injection into a vein or under the skin. It is usually given by a health care professional in a hospital or clinic setting. ?If you get this medicine at home, you will be taught how to prepare and give this medicine. Use exactly as directed. Take your medicine at regular intervals. Do not take your medicine more often than directed. ?It is important that you put your used needles and syringes in a special sharps container. Do not put them in a trash can. If you do not have a sharps container, call your pharmacist or healthcare provider to get one. ?A special MedGuide will be given to you by the pharmacist with each prescription and refill. Be sure to read this information carefully each time. ?Talk to your pediatrician regarding the use of this medicine in children. While this drug may be prescribed for selected conditions, precautions do apply. ?Overdosage: If you think you have taken too much of this medicine contact a poison control center or emergency  room at once. ?NOTE: This medicine is only for you. Do not share this medicine with others. ?What if I miss a dose? ?If you miss a dose, take it as soon as you can. If it is almost time for your next dose, take only that dose. Do not take double or extra doses. ?What may interact with this medication? ?Interactions have not been studied. ?This list may not describe all possible interactions. Give your health care provider a list of all the medicines, herbs, non-prescription drugs, or dietary supplements you use. Also tell them if you smoke, drink alcohol, or use illegal drugs. Some items may interact with your medicine. ?What should I watch for while using this medication? ?Your condition will be monitored carefully while you are receiving this medicine. ?You may need blood work done while you are taking this medicine. ?This medicine may cause a decrease in vitamin B6. You should make sure that you get enough vitamin B6 while you are taking this medicine. Discuss the foods you eat and the vitamins you take with your health care professional. ?What side effects may I notice from receiving this medication? ?Side effects that you should report to your doctor or health care professional as soon as possible: ?allergic reactions like skin rash, itching or hives, swelling of the face, lips, or tongue ?seizures ?signs and symptoms of a blood clot such as breathing problems; changes in vision; chest pain; severe, sudden headache; pain, swelling, warmth in the leg; trouble speaking; sudden numbness or weakness of the face, arm or leg ?signs and symptoms of a stroke like   changes in vision; confusion; trouble speaking or understanding; severe headaches; sudden numbness or weakness of the face, arm or leg; trouble walking; dizziness; loss of balance or coordination ?Side effects that usually do not require medical attention (report to your doctor or health care professional if they continue or are  bothersome): ?chills ?cough ?dizziness ?fever ?headaches ?joint pain ?muscle cramps ?muscle pain ?nausea, vomiting ?pain, redness, or irritation at site where injected ?This list may not describe all possible side effects. Call your doctor for medical advice about side effects. You may report side effects to FDA at 1-800-FDA-1088. ?Where should I keep my medication? ?Keep out of the reach of children. ?Store in a refrigerator between 2 and 8 degrees C (36 and 46 degrees F). Do not freeze or shake. Throw away any unused portion if using a single-dose vial. Multi-dose vials can be kept in the refrigerator for up to 21 days after the initial dose. Throw away unused medicine. ?NOTE: This sheet is a summary. It may not cover all possible information. If you have questions about this medicine, talk to your doctor, pharmacist, or health care provider. ?? 2023 Elsevier/Gold Standard (2016-11-29 00:00:00) ? ?

## 2021-10-22 ENCOUNTER — Other Ambulatory Visit: Payer: Self-pay | Admitting: *Deleted

## 2021-10-22 ENCOUNTER — Encounter: Payer: Self-pay | Admitting: *Deleted

## 2021-10-22 NOTE — Patient Outreach (Signed)
  Care Coordination   Follow Up Visit Note   10/22/2021 Name: [name]. Rama MRN: [id number] DOB: 03/06/1946  [name] is a 76 y.o. year old female who sees Hagler, Apolonio Schneiders, MD for primary care. I spoke with  [name] by phone today  What matters to the patients health and wellness today?  No needs to address today   Goals Addressed             This Visit's Progress    No needs to address today       F/u from previous telephone assessment: Hearing aides-received Laser eye surgery-received Continues pending appointment to [facility] oncologist however pt continues to receive shots at the oncology clinic.        SDOH assessments and interventions completed:   Yes Review all pending appointments with her PCP (Aug) and oncologist @ [facility].   Care Coordination Interventions Activated:  Yes Care Coordination Interventions:   Yes, provided  Follow up plan: No further intervention required.  Encounter Outcome:  Pt. Visit Completed   Anne Mina, RN Care Management Coordinator [place] Office [phone]

## 2021-10-25 DIAGNOSIS — E7800 Pure hypercholesterolemia, unspecified: Secondary | ICD-10-CM | POA: Diagnosis not present

## 2021-10-25 DIAGNOSIS — K219 Gastro-esophageal reflux disease without esophagitis: Secondary | ICD-10-CM | POA: Diagnosis not present

## 2021-10-25 DIAGNOSIS — I10 Essential (primary) hypertension: Secondary | ICD-10-CM | POA: Diagnosis not present

## 2021-10-28 ENCOUNTER — Encounter (INDEPENDENT_AMBULATORY_CARE_PROVIDER_SITE_OTHER): Payer: Medicare Other | Admitting: Ophthalmology

## 2021-10-28 DIAGNOSIS — H35033 Hypertensive retinopathy, bilateral: Secondary | ICD-10-CM | POA: Diagnosis not present

## 2021-10-28 DIAGNOSIS — E113512 Type 2 diabetes mellitus with proliferative diabetic retinopathy with macular edema, left eye: Secondary | ICD-10-CM | POA: Diagnosis not present

## 2021-10-28 DIAGNOSIS — H43813 Vitreous degeneration, bilateral: Secondary | ICD-10-CM

## 2021-10-28 DIAGNOSIS — I10 Essential (primary) hypertension: Secondary | ICD-10-CM | POA: Diagnosis not present

## 2021-10-28 DIAGNOSIS — E113591 Type 2 diabetes mellitus with proliferative diabetic retinopathy without macular edema, right eye: Secondary | ICD-10-CM | POA: Diagnosis not present

## 2021-11-01 ENCOUNTER — Other Ambulatory Visit: Payer: Self-pay | Admitting: Pharmacist

## 2021-11-03 DIAGNOSIS — H40053 Ocular hypertension, bilateral: Secondary | ICD-10-CM | POA: Diagnosis not present

## 2021-11-04 ENCOUNTER — Inpatient Hospital Stay: Payer: Medicare Other

## 2021-11-04 ENCOUNTER — Inpatient Hospital Stay: Payer: Medicare Other | Attending: Hematology and Oncology

## 2021-11-04 ENCOUNTER — Other Ambulatory Visit: Payer: Self-pay

## 2021-11-04 VITALS — BP 134/48 | HR 54 | Temp 98.9°F | Resp 16

## 2021-11-04 DIAGNOSIS — N1832 Chronic kidney disease, stage 3b: Secondary | ICD-10-CM

## 2021-11-04 DIAGNOSIS — D539 Nutritional anemia, unspecified: Secondary | ICD-10-CM

## 2021-11-04 DIAGNOSIS — D631 Anemia in chronic kidney disease: Secondary | ICD-10-CM | POA: Diagnosis not present

## 2021-11-04 LAB — CBC WITH DIFFERENTIAL/PLATELET
Abs Immature Granulocytes: 0.06 10*3/uL (ref 0.00–0.07)
Basophils Absolute: 0.1 10*3/uL (ref 0.0–0.1)
Basophils Relative: 1 %
Eosinophils Absolute: 0.2 10*3/uL (ref 0.0–0.5)
Eosinophils Relative: 2 %
HCT: 32.4 % — ABNORMAL LOW (ref 36.0–46.0)
Hemoglobin: 10.4 g/dL — ABNORMAL LOW (ref 12.0–15.0)
Immature Granulocytes: 1 %
Lymphocytes Relative: 31 %
Lymphs Abs: 2.4 10*3/uL (ref 0.7–4.0)
MCH: 28.1 pg (ref 26.0–34.0)
MCHC: 32.1 g/dL (ref 30.0–36.0)
MCV: 87.6 fL (ref 80.0–100.0)
Monocytes Absolute: 0.5 10*3/uL (ref 0.1–1.0)
Monocytes Relative: 7 %
Neutro Abs: 4.6 10*3/uL (ref 1.7–7.7)
Neutrophils Relative %: 58 %
Platelets: 236 10*3/uL (ref 150–400)
RBC: 3.7 MIL/uL — ABNORMAL LOW (ref 3.87–5.11)
RDW: 14.2 % (ref 11.5–15.5)
WBC: 7.8 10*3/uL (ref 4.0–10.5)
nRBC: 0 % (ref 0.0–0.2)

## 2021-11-04 MED ORDER — EPOETIN ALFA 40000 UNIT/ML IJ SOLN
40000.0000 [IU] | Freq: Once | INTRAMUSCULAR | Status: AC
  Administered 2021-11-04: 40000 [IU] via SUBCUTANEOUS
  Filled 2021-11-04: qty 1

## 2021-11-04 NOTE — Patient Instructions (Signed)
Epoetin Alfa injection ?What is this medication? ?EPOETIN ALFA (e POE e tin AL fa) helps your body make more red blood cells. This medicine is used to treat anemia caused by chronic kidney disease, cancer chemotherapy, or HIV-therapy. It may also be used before surgery if you have anemia. ?This medicine may be used for other purposes; ask your health care provider or pharmacist if you have questions. ?COMMON BRAND NAME(S): Epogen, Procrit, Retacrit ?What should I tell my care team before I take this medication? ?They need to know if you have any of these conditions: ?cancer ?heart disease ?high blood pressure ?history of blood clots ?history of stroke ?low levels of folate, iron, or vitamin B12 in the blood ?seizures ?an unusual or allergic reaction to erythropoietin, albumin, benzyl alcohol, hamster proteins, other medicines, foods, dyes, or preservatives ?pregnant or trying to get pregnant ?breast-feeding ?How should I use this medication? ?This medicine is for injection into a vein or under the skin. It is usually given by a health care professional in a hospital or clinic setting. ?If you get this medicine at home, you will be taught how to prepare and give this medicine. Use exactly as directed. Take your medicine at regular intervals. Do not take your medicine more often than directed. ?It is important that you put your used needles and syringes in a special sharps container. Do not put them in a trash can. If you do not have a sharps container, call your pharmacist or healthcare provider to get one. ?A special MedGuide will be given to you by the pharmacist with each prescription and refill. Be sure to read this information carefully each time. ?Talk to your pediatrician regarding the use of this medicine in children. While this drug may be prescribed for selected conditions, precautions do apply. ?Overdosage: If you think you have taken too much of this medicine contact a poison control center or emergency  room at once. ?NOTE: This medicine is only for you. Do not share this medicine with others. ?What if I miss a dose? ?If you miss a dose, take it as soon as you can. If it is almost time for your next dose, take only that dose. Do not take double or extra doses. ?What may interact with this medication? ?Interactions have not been studied. ?This list may not describe all possible interactions. Give your health care provider a list of all the medicines, herbs, non-prescription drugs, or dietary supplements you use. Also tell them if you smoke, drink alcohol, or use illegal drugs. Some items may interact with your medicine. ?What should I watch for while using this medication? ?Your condition will be monitored carefully while you are receiving this medicine. ?You may need blood work done while you are taking this medicine. ?This medicine may cause a decrease in vitamin B6. You should make sure that you get enough vitamin B6 while you are taking this medicine. Discuss the foods you eat and the vitamins you take with your health care professional. ?What side effects may I notice from receiving this medication? ?Side effects that you should report to your doctor or health care professional as soon as possible: ?allergic reactions like skin rash, itching or hives, swelling of the face, lips, or tongue ?seizures ?signs and symptoms of a blood clot such as breathing problems; changes in vision; chest pain; severe, sudden headache; pain, swelling, warmth in the leg; trouble speaking; sudden numbness or weakness of the face, arm or leg ?signs and symptoms of a stroke like   changes in vision; confusion; trouble speaking or understanding; severe headaches; sudden numbness or weakness of the face, arm or leg; trouble walking; dizziness; loss of balance or coordination ?Side effects that usually do not require medical attention (report to your doctor or health care professional if they continue or are  bothersome): ?chills ?cough ?dizziness ?fever ?headaches ?joint pain ?muscle cramps ?muscle pain ?nausea, vomiting ?pain, redness, or irritation at site where injected ?This list may not describe all possible side effects. Call your doctor for medical advice about side effects. You may report side effects to FDA at 1-800-FDA-1088. ?Where should I keep my medication? ?Keep out of the reach of children. ?Store in a refrigerator between 2 and 8 degrees C (36 and 46 degrees F). Do not freeze or shake. Throw away any unused portion if using a single-dose vial. Multi-dose vials can be kept in the refrigerator for up to 21 days after the initial dose. Throw away unused medicine. ?NOTE: This sheet is a summary. It may not cover all possible information. If you have questions about this medicine, talk to your doctor, pharmacist, or health care provider. ?? 2023 Elsevier/Gold Standard (2016-11-29 00:00:00) ? ?

## 2021-11-09 ENCOUNTER — Emergency Department: Payer: Medicare Other

## 2021-11-09 ENCOUNTER — Other Ambulatory Visit: Payer: Self-pay

## 2021-11-09 ENCOUNTER — Telehealth: Payer: Self-pay | Admitting: Hematology and Oncology

## 2021-11-09 ENCOUNTER — Emergency Department
Admission: EM | Admit: 2021-11-09 | Discharge: 2021-11-10 | Disposition: A | Discharge status: Home / Self Care | Payer: Medicare Other | Attending: Emergency Medicine | Admitting: Emergency Medicine

## 2021-11-09 DIAGNOSIS — R0789 Other chest pain: Secondary | ICD-10-CM | POA: Insufficient documentation

## 2021-11-09 DIAGNOSIS — R0602 Shortness of breath: Secondary | ICD-10-CM | POA: Insufficient documentation

## 2021-11-09 DIAGNOSIS — R6889 Other general symptoms and signs: Secondary | ICD-10-CM | POA: Diagnosis not present

## 2021-11-09 DIAGNOSIS — R001 Bradycardia, unspecified: Secondary | ICD-10-CM | POA: Diagnosis not present

## 2021-11-09 DIAGNOSIS — R079 Chest pain, unspecified: Secondary | ICD-10-CM | POA: Diagnosis not present

## 2021-11-09 DIAGNOSIS — X509XXA Other and unspecified overexertion or strenuous movements or postures, initial encounter: Secondary | ICD-10-CM

## 2021-11-09 DIAGNOSIS — X500XXA Overexertion from strenuous movement or load, initial encounter: Secondary | ICD-10-CM | POA: Diagnosis not present

## 2021-11-09 DIAGNOSIS — Z743 Need for continuous supervision: Secondary | ICD-10-CM | POA: Diagnosis not present

## 2021-11-09 LAB — CBC
HCT: 30.8 % — ABNORMAL LOW (ref 36.0–46.0)
Hemoglobin: 9.5 g/dL — ABNORMAL LOW (ref 12.0–15.0)
MCH: 27.6 pg (ref 26.0–34.0)
MCHC: 30.8 g/dL (ref 30.0–36.0)
MCV: 89.5 fL (ref 80.0–100.0)
Platelets: 240 10*3/uL (ref 150–400)
RBC: 3.44 MIL/uL — ABNORMAL LOW (ref 3.87–5.11)
RDW: 14.6 % (ref 11.5–15.5)
WBC: 8.1 10*3/uL (ref 4.0–10.5)
nRBC: 0.2 % (ref 0.0–0.2)

## 2021-11-09 LAB — BASIC METABOLIC PANEL
Anion gap: 5 (ref 5–15)
BUN: 42 mg/dL — ABNORMAL HIGH (ref 8–23)
CO2: 24 mmol/L (ref 22–32)
Calcium: 8.8 mg/dL — ABNORMAL LOW (ref 8.9–10.3)
Chloride: 113 mmol/L — ABNORMAL HIGH (ref 98–111)
Creatinine, Ser: 1.61 mg/dL — ABNORMAL HIGH (ref 0.44–1.00)
GFR, Estimated: 33 mL/min — ABNORMAL LOW (ref >60)
Glucose, Bld: 229 mg/dL — ABNORMAL HIGH (ref 70–99)
Potassium: 3.3 mmol/L — ABNORMAL LOW (ref 3.5–5.1)
Sodium: 142 mmol/L (ref 135–145)

## 2021-11-09 LAB — TROPONIN I (HIGH SENSITIVITY): Troponin I (High Sensitivity): 13 ng/L (ref <18)

## 2021-11-09 NOTE — Telephone Encounter (Signed)
Per 7/31 in basket, pt has been called and confirmed

## 2021-11-09 NOTE — ED Notes (Signed)
Pt assisted to use toilet in rm. Pt able to urinate at this time. Pt ambulatory to bed and placed on cardiac monitor. Call light within reach. Pt has no further needs at this time.

## 2021-11-09 NOTE — ED Triage Notes (Signed)
Pt was at a family function and there was an argument. Pt began walking down the street to get away. Pt began having shortness of breath and chest pain. Pt has hx of stent placement in January.

## 2021-11-10 ENCOUNTER — Other Ambulatory Visit: Payer: Self-pay

## 2021-11-10 DIAGNOSIS — R0789 Other chest pain: Secondary | ICD-10-CM | POA: Diagnosis not present

## 2021-11-10 LAB — TROPONIN I (HIGH SENSITIVITY)
Troponin I (High Sensitivity): 24 ng/L — ABNORMAL HIGH (ref <18)
Troponin I (High Sensitivity): 28 ng/L — ABNORMAL HIGH (ref <18)

## 2021-11-10 MED ORDER — SODIUM CHLORIDE 0.9 % IV BOLUS
500.0000 mL | Freq: Once | INTRAVENOUS | Status: AC
  Administered 2021-11-10: 500 mL via INTRAVENOUS

## 2021-11-10 MED ORDER — ALUM & MAG HYDROXIDE-SIMETH 200-200-20 MG/5ML PO SUSP
30.0000 mL | Freq: Once | ORAL | Status: AC
  Administered 2021-11-10: 30 mL via ORAL
  Filled 2021-11-10: qty 30

## 2021-11-10 MED ORDER — POTASSIUM CHLORIDE CRYS ER 20 MEQ PO TBCR
40.0000 meq | EXTENDED_RELEASE_TABLET | Freq: Once | ORAL | Status: AC
  Administered 2021-11-10: 40 meq via ORAL
  Filled 2021-11-10: qty 2

## 2021-11-10 NOTE — Discharge Instructions (Addendum)

## 2021-11-10 NOTE — ED Provider Notes (Signed)
[facility] Provider Note    Event Date/Time   First MD Initiated Contact with Patient 11/09/21 2355     (approximate)   History   Chest Pain   HPI {Remember to add pertinent medical, surgical, social, and/or OB history to HPI:1} [name] is a 76 y.o. female  ***       Physical Exam   Triage Vital Signs: ED Triage Vitals  Enc Vitals Group     BP 11/09/21 2009 (!) 113/56     Pulse Rate 11/09/21 2009 63     Resp 11/09/21 2009 19     Temp 11/09/21 2009 98.4 F (36.9 C)     Temp Source 11/09/21 2009 Oral     SpO2 11/09/21 2005 97 %     Weight 11/09/21 2009 60.8 kg (134 lb 0.6 oz)     Height 11/09/21 2009 1.6 m ('5\' 3"'$ )     Head Circumference --      Peak Flow --      Pain Score 11/09/21 2009 8     Pain Loc --      Pain Edu? --      Excl. in [place]? --     Most recent vital signs: Vitals:   11/09/21 2005 11/09/21 2009  BP:  (!) 113/56  Pulse:  63  Resp:  19  Temp:  98.4 F (36.9 C)  SpO2: 97% 98%    {Only need to document appropriate and relevant physical exam:1} General: Awake, no distress. *** CV:  Good peripheral perfusion. *** Resp:  Normal effort. *** Abd:  No distention. *** Other:  ***   ED Results / Procedures / Treatments   Labs (all labs ordered are listed, but only abnormal results are displayed) Labs Reviewed  BASIC METABOLIC PANEL - Abnormal; Notable for the following components:      Result Value   Potassium 3.3 (*)    Chloride 113 (*)    Glucose, Bld 229 (*)    BUN 42 (*)    Creatinine, Ser 1.61 (*)    Calcium 8.8 (*)    GFR, Estimated 33 (*)    All other components within normal limits  CBC - Abnormal; Notable for the following components:   RBC 3.44 (*)    Hemoglobin 9.5 (*)    HCT 30.8 (*)    All other components within normal limits  TROPONIN I (HIGH SENSITIVITY)  TROPONIN I (HIGH SENSITIVITY)     EKG  ED ECG REPORT I, Hinda Kehr, the attending physician, personally viewed and  interpreted this ECG.  Date: 11/09/2021 EKG Time: 20: 07 Rate: 64 Rhythm: normal sinus rhythm QRS Axis: normal Intervals: normal ST/T Wave abnormalities: normal Narrative Interpretation: no evidence of acute ischemia    RADIOLOGY I viewed and interpreted the patient's one-view portable chest x-ray.  I see no evidence of pneumonia, pulmonary edema, nor other acute abnormality.  I also read the radiologist's report, which confirmed no acute findings.    PROCEDURES:  Critical Care performed: {CriticalCareYesNo:19197::"Yes, see critical care procedure note(s)","No"}  Procedures   MEDICATIONS ORDERED IN ED: Medications - No data to display   IMPRESSION / MDM / [place] / ED COURSE  I reviewed the triage vital signs and the nursing notes.                              Differential diagnosis includes, but is not limited to, ***  Patient's presentation is most consistent with {EM COPA:27473}  {If the patient is on the monitor, remove the brackets and asterisks on the sentence below and remember to document it as a Procedure as well. Otherwise delete the sentence below:1} {**The patient is on the cardiac monitor to evaluate for evidence of arrhythmia and/or significant heart rate changes.**} {Remember to include, when applicable, any/all of the following data: independent review of imaging independent review of labs (comment specifically on pertinent positives and negatives) review of specific prior hospitalizations, PCP/specialist notes, etc. discuss meds given and prescribed document any discussion with consultants (including hospitalists) any clinical decision tools you used and why (PECARN, NEXUS, etc.) did you consider admitting the patient? document social determinants of health affecting patient's care (homelessness, inability to follow up in a timely fashion, etc) document any pre-existing conditions increasing risk on current visit (e.g. diabetes and HTN  increasing danger of high-risk chest pain/ACS) describes what meds you gave (especially parenteral) and why any other interventions?:1}     FINAL CLINICAL IMPRESSION(S) / ED DIAGNOSES   Final diagnoses:  None     Rx / DC Orders   ED Discharge Orders     None        Note:  This document was prepared using Dragon voice recognition software and may include unintentional dictation errors.

## 2021-11-23 DIAGNOSIS — E118 Type 2 diabetes mellitus with unspecified complications: Secondary | ICD-10-CM | POA: Diagnosis not present

## 2021-11-23 DIAGNOSIS — E7800 Pure hypercholesterolemia, unspecified: Secondary | ICD-10-CM | POA: Diagnosis not present

## 2021-11-23 DIAGNOSIS — I10 Essential (primary) hypertension: Secondary | ICD-10-CM | POA: Diagnosis not present

## 2021-11-25 ENCOUNTER — Encounter (INDEPENDENT_AMBULATORY_CARE_PROVIDER_SITE_OTHER): Payer: Medicare Other | Admitting: Ophthalmology

## 2021-11-26 IMAGING — MG DIGITAL SCREENING BILAT W/ TOMO W/ CAD
8 series · 8 of 24 positions shown · non-contrast
Comparison: Previous exam(s).

CLINICAL DATA: Screening.

EXAM:
DIGITAL SCREENING BILATERAL MAMMOGRAM WITH TOMO AND CAD

[L MLO synth-2D]
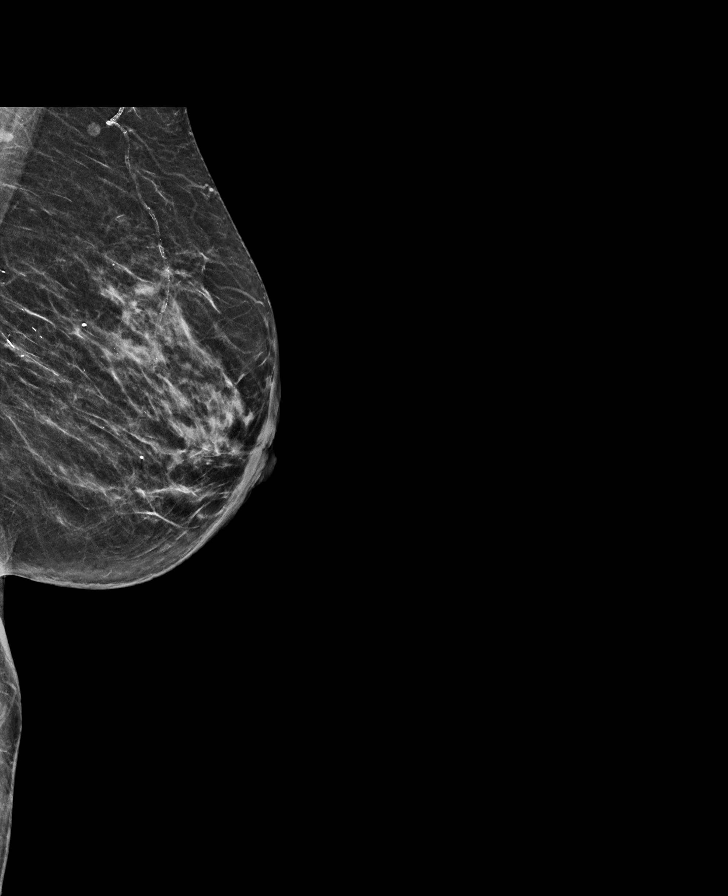

[R MLO synth-2D]
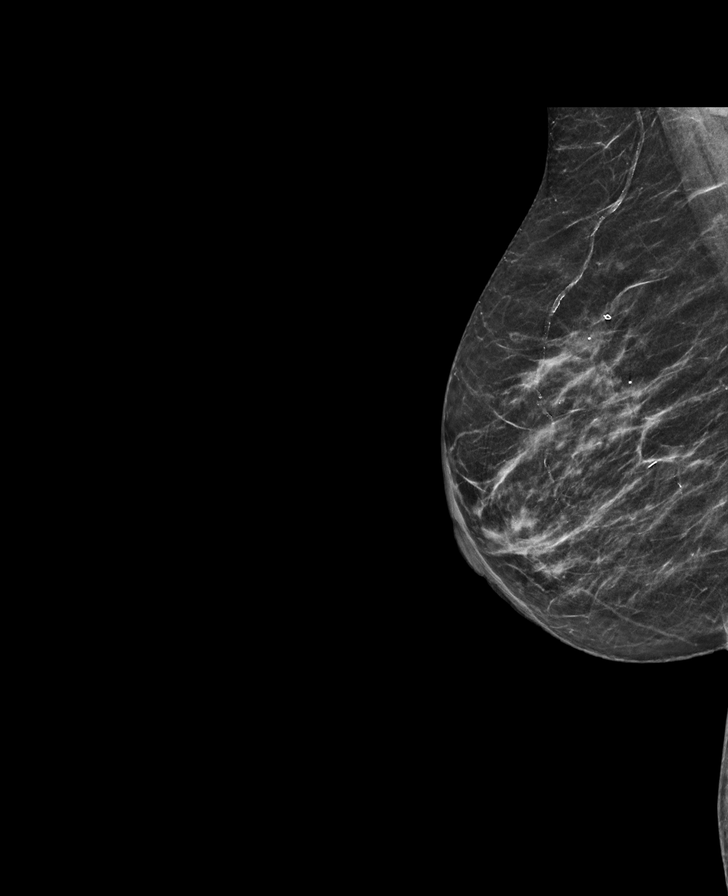

[L CC synth-2D]
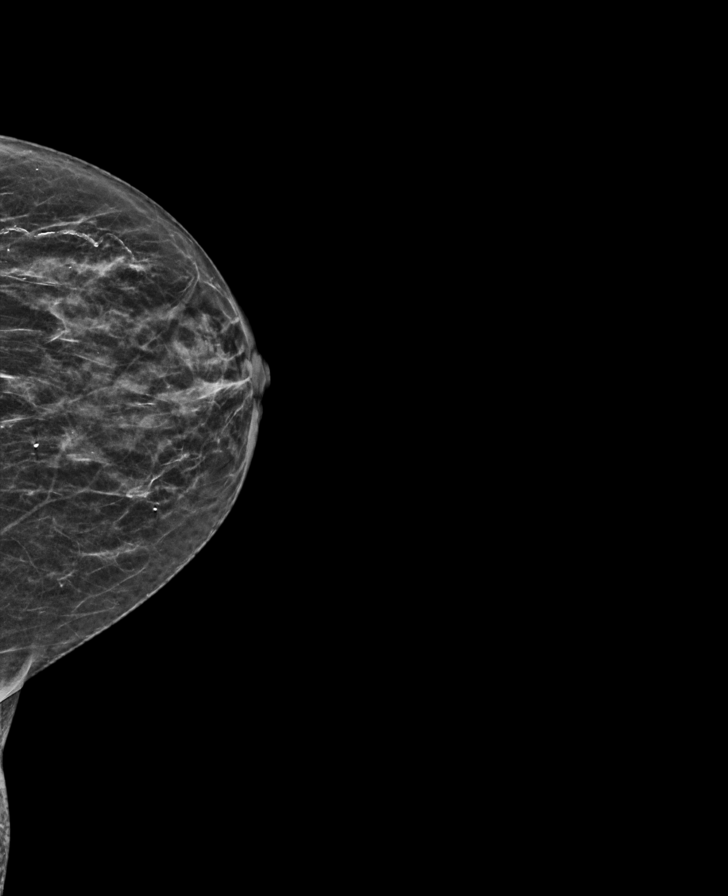

[R CC synth-2D]
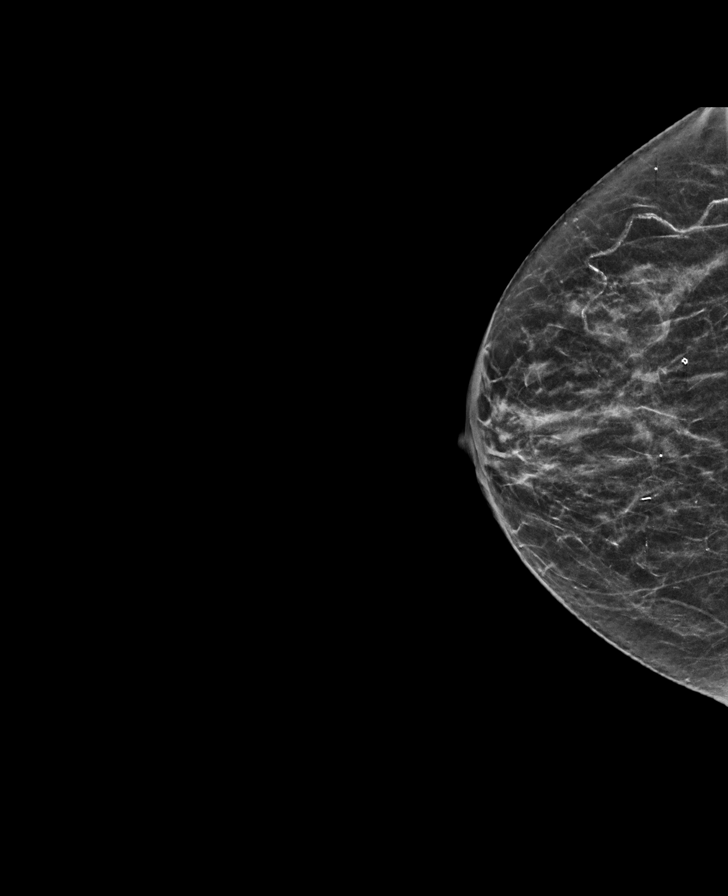

[R CC tomo · tomo slice 27/53.0]
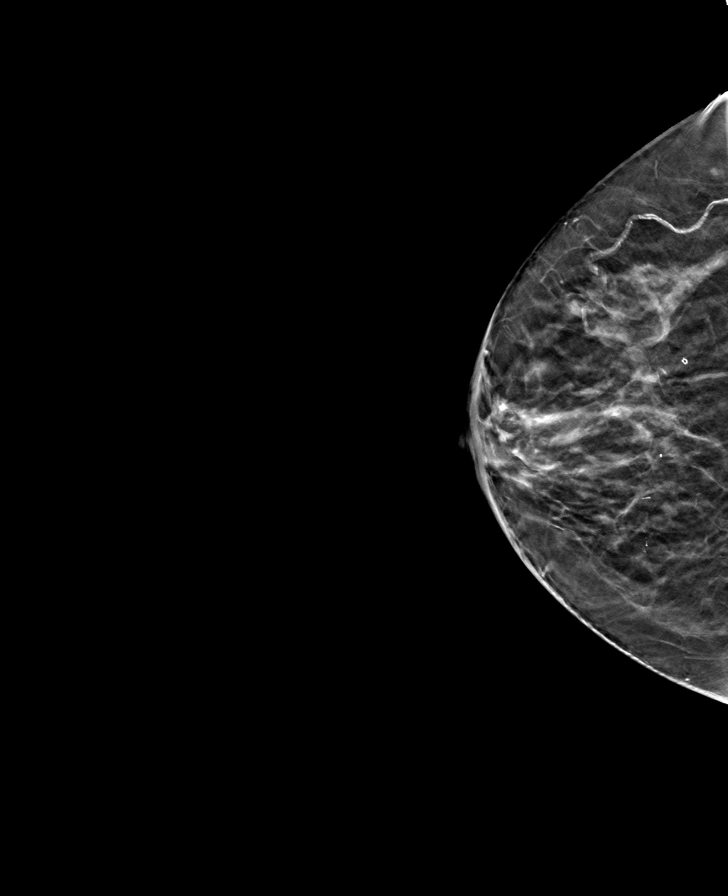

[L CC tomo · tomo slice 24/47.0]
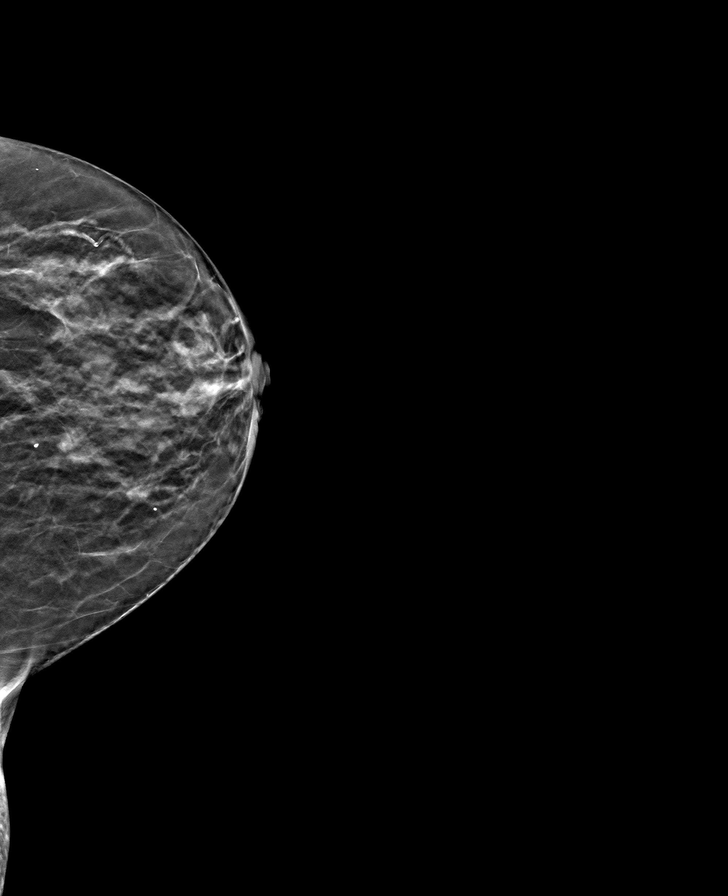

[L MLO tomo · tomo slice 27/53.0]
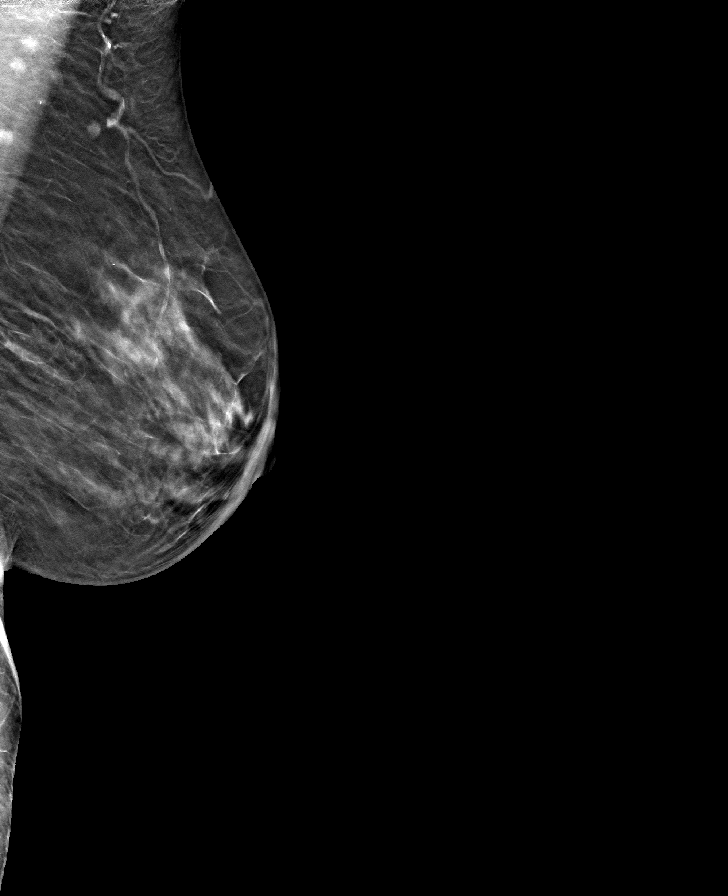

[R MLO tomo · tomo slice 29/56.0]
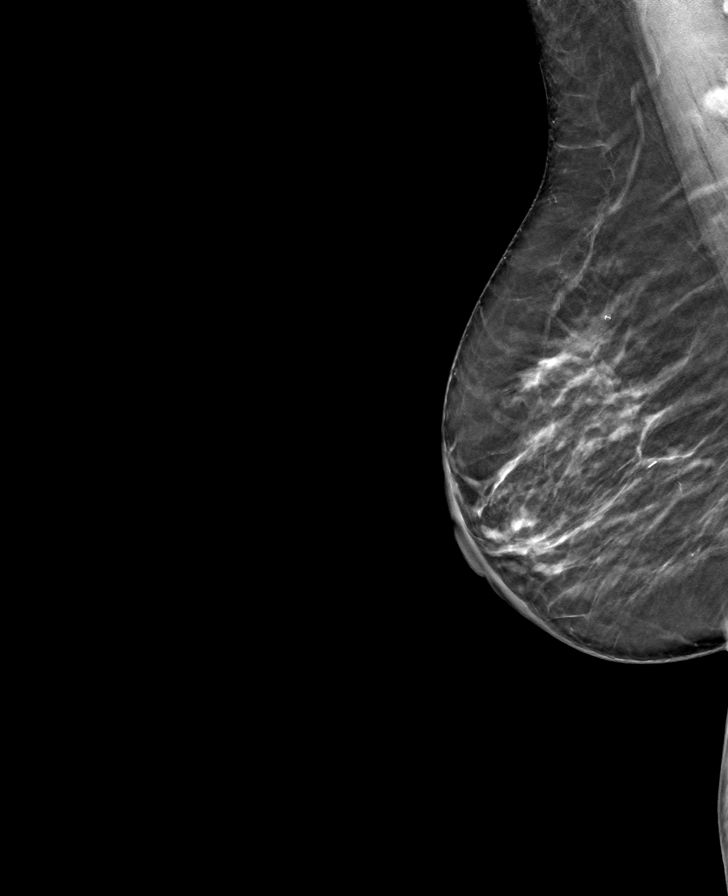

[8 of 24 positions shown; findings below may reference images not displayed]

ACR Breast Density Category c: The breast tissue is heterogeneously
dense, which may obscure small masses.
FINDINGS: There are no findings suspicious for malignancy. Images were
processed with CAD.
IMPRESSION: No mammographic evidence of malignancy. A result letter of this
screening mammogram will be mailed directly to the patient.

RECOMMENDATION:
Screening mammogram in one year. (Code:FT-U-LHB)

BI-RADS CATEGORY  1: Negative.

## 2021-12-02 ENCOUNTER — Other Ambulatory Visit: Payer: Self-pay

## 2021-12-02 ENCOUNTER — Inpatient Hospital Stay: Payer: Medicare Other | Attending: Hematology and Oncology

## 2021-12-02 ENCOUNTER — Inpatient Hospital Stay: Payer: Medicare Other

## 2021-12-02 VITALS — BP 130/56 | HR 52 | Temp 98.5°F | Resp 16

## 2021-12-02 DIAGNOSIS — D631 Anemia in chronic kidney disease: Secondary | ICD-10-CM | POA: Insufficient documentation

## 2021-12-02 DIAGNOSIS — N1832 Chronic kidney disease, stage 3b: Secondary | ICD-10-CM

## 2021-12-02 DIAGNOSIS — D539 Nutritional anemia, unspecified: Secondary | ICD-10-CM

## 2021-12-02 LAB — CBC WITH DIFFERENTIAL/PLATELET
Abs Immature Granulocytes: 0.02 10*3/uL (ref 0.00–0.07)
Basophils Absolute: 0.1 10*3/uL (ref 0.0–0.1)
Basophils Relative: 1 %
Eosinophils Absolute: 0.1 10*3/uL (ref 0.0–0.5)
Eosinophils Relative: 2 %
HCT: 33 % — ABNORMAL LOW (ref 36.0–46.0)
Hemoglobin: 10.8 g/dL — ABNORMAL LOW (ref 12.0–15.0)
Immature Granulocytes: 0 %
Lymphocytes Relative: 34 %
Lymphs Abs: 2.1 10*3/uL (ref 0.7–4.0)
MCH: 28.2 pg (ref 26.0–34.0)
MCHC: 32.7 g/dL (ref 30.0–36.0)
MCV: 86.2 fL (ref 80.0–100.0)
Monocytes Absolute: 0.4 10*3/uL (ref 0.1–1.0)
Monocytes Relative: 7 %
Neutro Abs: 3.5 10*3/uL (ref 1.7–7.7)
Neutrophils Relative %: 56 %
Platelets: 227 10*3/uL (ref 150–400)
RBC: 3.83 MIL/uL — ABNORMAL LOW (ref 3.87–5.11)
RDW: 13.9 % (ref 11.5–15.5)
WBC: 6.2 10*3/uL (ref 4.0–10.5)
nRBC: 0 % (ref 0.0–0.2)

## 2021-12-02 MED ORDER — EPOETIN ALFA 40000 UNIT/ML IJ SOLN
40000.0000 [IU] | Freq: Once | INTRAMUSCULAR | Status: AC
  Administered 2021-12-02: 40000 [IU] via SUBCUTANEOUS
  Filled 2021-12-02: qty 1

## 2021-12-08 ENCOUNTER — Encounter (INDEPENDENT_AMBULATORY_CARE_PROVIDER_SITE_OTHER): Payer: Medicare Other | Admitting: Ophthalmology

## 2021-12-08 DIAGNOSIS — E113512 Type 2 diabetes mellitus with proliferative diabetic retinopathy with macular edema, left eye: Secondary | ICD-10-CM

## 2021-12-08 DIAGNOSIS — H35372 Puckering of macula, left eye: Secondary | ICD-10-CM | POA: Diagnosis not present

## 2021-12-08 DIAGNOSIS — H35033 Hypertensive retinopathy, bilateral: Secondary | ICD-10-CM | POA: Diagnosis not present

## 2021-12-08 DIAGNOSIS — H43813 Vitreous degeneration, bilateral: Secondary | ICD-10-CM

## 2021-12-08 DIAGNOSIS — I10 Essential (primary) hypertension: Secondary | ICD-10-CM

## 2021-12-08 DIAGNOSIS — E113591 Type 2 diabetes mellitus with proliferative diabetic retinopathy without macular edema, right eye: Secondary | ICD-10-CM | POA: Diagnosis not present

## 2021-12-19 NOTE — Progress Notes (Unsigned)
Cardiology Office Note:    Date:  12/21/2021   ID:  [name], Mysliwiec September 11, 1945, MRN [id number]  PCP:  Caren Macadam, [place] Prov[facility] Cardiologist:  Sinclair Grooms, MD  /  Dr. [name] for [place]  Referring MD: Caren Macadam, MD   Chief Complaint  Patient presents with   Hospitalization Follow-up    ER visit for chest pain    History of Present Illness:    [name] is a 76 y.o. female with a hx of CAD, HTN, hyperlipidemia, DM, and PVD.  Heart catheterization 04/17/2018 showed a 90% apical LAD lesion that was treated medically, 65% OM1, 60% mid LCx, 50% mid RCA stenosis.  Chest pain suspected due to apical LAD obstruction.  He was started on beta-blocker and Plavix.  Repeat heart catheterization 01/17/2019 showed the focal 60 to 70% mid RCA stenosis was positive with DFR 0.83 treated with DES.  She was placed on DAPT with aspirin and Plavix.  Echocardiogram revealed LVEF 60-65%, moderately increased LVH, and mild TR.  She has been followed for lifestyle limiting claudication with ABI 0.89 on the left 01/29/2020.  Peripheral angiography 03/23/2020 showed 70% segmental left SFA stenosis treated with atherectomy and drug-coated balloon angioplasty.  She subsequently had repeat peripheral vascular angiogram 05/31/2021 for mid right SFA stenosis treated with atherectomy and drug-coated balloon angioplasty.  These interventions initially resolved her claudication.  She was last seen by Dr. Alvester Chou 06/11/2021 and was doing well but with mild claudication on the left.  Repeat Dopplers in 2022 did show possible restenosis on the left.  Initial plan was to follow Dopplers in 6 months, approximately this month.  She was seen in the ER for chest pain 11/10/21.  Chest pain occurred with walking further than she normally does after an argument with her daughter.  She also reported possibly passing out on the road when she was walking.  EMS brought her to the ER.  Troponin was relatively  flat she was discharged without admission with close cardiology follow-up.  She presents today for follow-up. No further chest pain, but pain in her legs bilaterally - pain seem worse. She is due for repeat dopplers this month. Will schedule this and have a follow up appt with Dr. [name] right after.    Past Medical History:  Diagnosis Date   Allergy    Anemia    Asthma    Blood transfusion without reported diagnosis 12/23/2004   with colon cancer   Brain tumor (benign) ([place])    Colon cancer ([name]) Nov. 27,  2006   COVID-19 04/2019   DEPRESSIVE DISORDER, NOS 06/08/2006   Qualifier: Diagnosis of  By: [name]     Diabetes mellitus    Type 2   Dysphagia 11/17/2014   ENDOSCOPIC IMPRESSION: 1) Single distal esophageal erison - superficial and 5-7 mm. Biopsied 2) S/P fundoplication - GE junction dilated to 20 mm (18-19-20 mm balloon) 3) Otherwise normal except some tortuosity of esophagus RECOMMENDATIONS: 1. Clear liquids until 1100, then soft foods rest of day. Resume prior diet tomorrow. 2. Office will call with results 3. If persistent dysphagia anticipate Ba   Erythropoietin (EPO) stimulating agent anemia management patient 10/01/2020   EXTERNAL HEMORRHOIDS 10/23/2001   Qualifier: Diagnosis of  By: Nolon Rod CMA (AAMA), Robin     Female bladder prolapse    Gastritis    GERD (gastroesophageal reflux disease)    Glaucoma    Hip pain 10/30/2019   History  of colon cancer 06/08/2006   Status post resection (right colectomy) in 2006. Tumor stage was T3 N0 IIA. Received 4 cycles of FOLFOX and 8 cycles of chemotherapy with 5 fluorouracil leucovorin and 5FU from 04/27/2005 through 10/13/2005 Colonoscopy 06/2011 no polyps (suspected polyp was not true polyp) - routine repeat colonoscopy about 06/2016   Hypercholesterolemia    Hypertension    Itching 01/09/2012   For the past 3 weeks she has noticed full body itching.  She has not noticed any specific areas for the itching is worse.  She has tried to  apply triamcinolone and petroleum jelly to reduce the itching.  This does not seem to be effective so far.  She denies right upper quadrant pain and denies any history of illicit drug use.  She has not had issues in the past with bedbugs nor does she have any    Malignant neoplasm of colon ([place]) 06/28/2019   MENINGIOMA 02/07/2007   Qualifier: Diagnosis of  By: [name], HEIDI     Pain in both feet 12/14/2017   Patient reports that she had has had some foot pain bilaterally for the past 7 years.  She reports that it seems to have gotten worse in the past 2 months.  This pain is not burning.  She reports good sensation in both feet.  She sometimes has trouble walking due to the pain.  It is described as though she has a wound on her foot.  She has not had recent trauma to her feet and denies open wounds o   Unstable angina ([place]) 01/15/2019    Past Surgical History:  Procedure Laterality Date   ABDOMINAL AORTOGRAM W/LOWER EXTREMITY Left 03/23/2020   Procedure: ABDOMINAL AORTOGRAM W/LOWER EXTREMITY;  Surgeon: Lorretta Harp, MD;  Location: [facility];  Service: Cardiovascular;  Laterality: Left;   ABDOMINAL HYSTERECTOMY  1976   BALLOON DILATION N/A 08/23/2017   Procedure: BALLOON DILATION;  Surgeon: Milus Banister, MD;  Location: [facility];  Service: Endoscopy;  Laterality: N/A;  Esophageal Diltation   bladder tac     CARDIAC CATHETERIZATION  2005   CATARACT EXTRACTION, BILATERAL     COLONOSCOPY     COLONOSCOPY W/ BIOPSIES     CORONARY STENT INTERVENTION N/A 01/17/2019   Procedure: CORONARY STENT INTERVENTION;  Surgeon: Nelva Bush, MD;  Location: [facility];  Service: Cardiovascular;  Laterality: N/A;   EMBOLIZATION Left 03/23/2020   Procedure: EMBOLIZATION;  Surgeon: Lorretta Harp, MD;  Location: [facility];  Service: Cardiovascular;  Laterality: Left;  SFA AND DCB   ESOPHAGEAL MANOMETRY N/A 03/20/2015   Procedure: ESOPHAGEAL MANOMETRY (EM);  Surgeon:  Gatha Mayer, MD;  Location: [facility];  Service: Endoscopy;  Laterality: N/A;   ESOPHAGOGASTRODUODENOSCOPY (EGD) WITH PROPOFOL N/A 08/23/2017   Procedure: ESOPHAGOGASTRODUODENOSCOPY (EGD) WITH PROPOFOL;  Surgeon: Milus Banister, MD;  Location: [facility];  Service: Endoscopy;  Laterality: N/A;   EYE SURGERY     mole removed left eye   INTERSTIM IMPLANT PLACEMENT  08/25/2016   Medtronic Bladder control device: Operator: Bjorn Loser, MD ([place])   INTRAVASCULAR PRESSURE WIRE/FFR STUDY N/A 01/17/2019   Procedure: INTRAVASCULAR PRESSURE WIRE/FFR STUDY;  Surgeon: Nelva Bush, MD;  Location: [facility];  Service: Cardiovascular;  Laterality: N/A;   LAPAROSCOPIC NISSEN FUNDOPLICATION  8416   LEFT HEART CATH AND CORONARY ANGIOGRAPHY N/A 04/17/2018   Procedure: LEFT HEART CATH AND CORONARY ANGIOGRAPHY;  Surgeon: Belva Crome, MD;  Location: [facility]  INVASIVE [facility];  Service: Cardiovascular;  Laterality: N/A;   LEFT HEART CATH AND CORONARY ANGIOGRAPHY N/A 01/17/2019   Procedure: LEFT HEART CATH AND CORONARY ANGIOGRAPHY;  Surgeon: Nelva Bush, MD;  Location: [facility];  Service: Cardiovascular;  Laterality: N/A;   LOWER EXTREMITY ANGIOGRAPHY  05/31/2021   Procedure: Lower Extremity Angiography;  Surgeon: Lorretta Harp, MD;  Location: [facility];  Service: Cardiovascular;;   PERIPHERAL VASCULAR ATHERECTOMY  05/31/2021   Procedure: PERIPHERAL VASCULAR ATHERECTOMY;  Surgeon: Lorretta Harp, MD;  Location: [facility];  Service: Cardiovascular;;  RT SFA   repair prolapsedbladder     RIGHT COLECTOMY  03/07/2005   TMJ ARTHROPLASTY     UPPER GASTROINTESTINAL ENDOSCOPY      Current Medications: Current Meds  Medication Sig   amLODipine (NORVASC) 5 MG tablet Take 5 mg by mouth every morning.   aspirin 81 MG chewable tablet Chew 81 mg by mouth daily.   clopidogrel (PLAVIX) 75 MG tablet TAKE 1 TABLET(75 MG) BY MOUTH DAILY   dapagliflozin propanediol  (FARXIGA) 10 MG TABS tablet Take 10 mg by mouth daily.   famotidine (PEPCID) 20 MG tablet TAKE 1 TABLET(20 MG) BY MOUTH TWICE DAILY AS NEEDED FOR HEARTBURN OR INDIGESTION   glipiZIDE (GLUCOTROL) 5 MG tablet Take 5 mg by mouth daily before breakfast.   isosorbide mononitrate (IMDUR) 60 MG 24 hr tablet Take 60 mg by mouth daily.   meclizine (ANTIVERT) 25 MG tablet SMARTSIG:1 Tablet(s) By Mouth Every 12 Hours PRN   polyethylene glycol powder (GLYCOLAX/MIRALAX) 17 GM/SCOOP powder Take 17 g by mouth daily. (Patient taking differently: Take 17 g by mouth daily as needed for moderate constipation.)   prednisoLONE acetate (PRED FORTE) 1 % ophthalmic suspension Place 1 drop into the left eye.   quinapril-hydrochlorothiazide (ACCURETIC) 20-12.5 MG tablet TAKE 1 TABLET BY MOUTH DAILY   rosuvastatin (CRESTOR) 20 MG tablet TAKE 1 TABLET(20 MG) BY MOUTH DAILY   timolol (TIMOPTIC) 0.5 % ophthalmic solution Place 1 drop into both eyes daily.   [DISCONTINUED] nitroGLYCERIN (NITROSTAT) 0.4 MG SL tablet Place 1 tablet (0.4 mg total) under the tongue every 5 (five) minutes as needed for chest pain.   Current Facility-Administered Medications for the 12/21/21 encounter (Office Visit) with Ledora Bottcher, PA  Medication   sodium chloride flush (NS) 0.9 % injection 3 mL     Allergies:   Metformin and related   Social History   Socioeconomic History   Marital status: Legally Separated    Spouse name: Not on file   Number of children: 2   Years of education: Not on file   Highest education level: Not on file  Occupational History   Not on file  Tobacco Use   Smoking status: Never   Smokeless tobacco: Never  Vaping Use   Vaping Use: Never used  Substance and Sexual Activity   Alcohol use: No   Drug use: No   Sexual activity: Not on file  Other Topics Concern   Not on file  Social History Narrative   Not on file   Social Determinants of Health   Financial Resource Strain: Not on file  Food  Insecurity: No Food Insecurity (10/22/2021)   Hunger Vital Sign    Worried About Running Out of Food in the Last Year: Never true    Ran Out of Food in the Last Year: Never true  Transportation Needs: No Transportation Needs (10/22/2021)   PRAPARE - Transportation  Lack of Transportation (Medical): No    Lack of Transportation (Non-Medical): No  Physical Activity: Not on file  Stress: Not on file  Social Connections: Not on file     Family History: The patient's family history includes Bone cancer in her father; Diabetes in her daughter; Heart attack in her daughter; Hypertension in her daughter; Lymphoma in an other family member; Suicidality in her son. There is no history of Colon cancer or Stomach cancer.  ROS:   Please see the history of present illness.     All other systems reviewed and are negative.  EKGs/Labs/Other Studies Reviewed:    The following studies were reviewed today:  LEA dopplers 06/2021: Widely patent RSFA after intervention. Repeat  6 months   EKG:  EKG is ordered today.  The ekg ordered today demonstrates sinus bradycardia HR 54  Recent Labs: 07/29/2021: ALT 13; Magnesium 2.6 11/09/2021: BUN 42; Creatinine, Ser 1.61; Potassium 3.3; Sodium 142 12/02/2021: Hemoglobin 10.8; Platelets 227  Recent Lipid Panel    Component Value Date/Time   CHOL 83 (L) 12/08/2020 1051   TRIG 95 12/08/2020 1051   HDL 42 12/08/2020 1051   CHOLHDL 2.0 12/08/2020 1051   CHOLHDL 3.8 07/20/2015 1641   VLDL 74 (H) 07/20/2015 1641   LDLCALC 23 12/08/2020 1051     Risk Assessment/Calculations:                Physical Exam:    VS:  BP 118/60 (Cuff Size: Normal)   Pulse (!) 54   Ht '5\' 4"'$  (1.626 m)   Wt 132 lb (59.9 kg)   SpO2 98%   BMI 22.66 kg/m     Wt Readings from Last 3 Encounters:  12/21/21 132 lb (59.9 kg)  11/09/21 134 lb 0.6 oz (60.8 kg)  07/29/21 134 lb (60.8 kg)     GEN:  Well nourished, well developed in no acute distress HEENT: Normal NECK: No JVD;  No carotid bruits LYMPHATICS: No lymphadenopathy CARDIAC: RRR, no murmurs, rubs, gallops RESPIRATORY:  Clear to auscultation without rales, wheezing or rhonchi  ABDOMEN: Soft, non-tender, non-distended MUSCULOSKELETAL:  No edema; No deformity  SKIN: Warm and dry NEUROLOGIC:  Alert and oriented x 3 PSYCHIATRIC:  Normal affect   ASSESSMENT:    1. Syncope and collapse   2. Chest pain of unknown etiology   3. CAD S/P percutaneous coronary angioplasty   4. PAD (peripheral artery disease) ([place])   5. Claudication in peripheral vascular disease ([place])   6. Hyperlipidemia associated with type 2 diabetes mellitus ([place])    PLAN:    In order of problems listed above:  Chest pain No further chest pain. EKG stable HS troponin flat, inconsistent with ACS Suspect this was due to stress and heat   Syncope Suspect vagal-mediated - following argument, was "hot and tired" after walking She does not drive but has a license. I advised no driving for 6 months after syncope.    CAD s/p DES-RCA 01/2019 Conitnue ASA, plavix, statin, and farxiga No BB given bradycardia   Hypertension Amlodipine, imdur, quinapril-HCTZ,    Hyperlipidemia with LDL goal less than 70 LDL was 28 Given DM, CAD, and PAD, would opt to get LDL closer to 55 On 20 mg crestor A1c 7.1%   PAD with claudication S/p atherectomy and balloon angioplasty to bilateral SFAs Possible restenosis of the left SFA We will repeat Dopplers this month   As above, dopplers then appt with Dr. [name].   Medication Adjustments/Labs and Tests Ordered:  Current medicines are reviewed at length with the patient today.  Concerns regarding medicines are outlined above.  Orders Placed This Encounter  Procedures   EKG 12-Lead   Meds ordered this encounter  Medications   nitroGLYCERIN (NITROSTAT) 0.4 MG SL tablet    Sig: Place 1 tablet (0.4 mg total) under the tongue every 5 (five) minutes as needed for chest pain.    Dispense:  25  tablet    Refill:  11    Patient Instructions  Medication Instructions:  Your physician recommends that you continue on your current medications as directed. Please refer to the Current Medication list given to you today.   *If you need a refill on your cardiac medications before your next appointment, please call your pharmacy*   Lab Work NONE ordered at this time of appointment   If you have labs (blood work) drawn today and your tests are completely normal, you will receive your results only by: [place] (if you have MyChart) OR A paper copy in the mail If you have any lab test that is abnormal or we need to change your treatment, we will call you to review the results.   Testing/Procedures Schedule LEAs (previously ordered)  Follow-Up: At [facility], you and your health needs are our priority.  As part of our continuing mission to provide you with exceptional heart care, we have created designated Provider Care Teams.  These Care Teams include your primary Cardiologist (physician) and Advanced Practice Providers (APPs -  Physician Assistants and Nurse Practitioners) who all work together to provide you with the care you need, when you need it.  We recommend signing up for the patient portal called "MyChart".  Sign up information is provided on this After Visit Summary.  MyChart is used to connect with patients for Virtual Visits (Telemedicine).  Patients are able to view lab/test results, encounter notes, upcoming appointments, etc.  Non-urgent messages can be sent to your provider as well.   To learn more about what you can do with MyChart, go to [email or web address].    Your next appointment:    Next available   The format for your next appointment:   In Person  Provider:   Sinclair Grooms, MD     Other Instructions   Important Information About [place]         Signed, Ledora Bottcher, [place]  12/21/2021 4:51 PM    [place]

## 2021-12-21 ENCOUNTER — Ambulatory Visit: Payer: Medicare Other | Attending: Physician Assistant | Admitting: Physician Assistant

## 2021-12-21 ENCOUNTER — Encounter: Payer: Self-pay | Admitting: Physician Assistant

## 2021-12-21 ENCOUNTER — Other Ambulatory Visit: Payer: Self-pay | Admitting: Interventional Cardiology

## 2021-12-21 VITALS — BP 118/60 | HR 54 | Ht 64.0 in | Wt 132.0 lb

## 2021-12-21 DIAGNOSIS — E1169 Type 2 diabetes mellitus with other specified complication: Secondary | ICD-10-CM

## 2021-12-21 DIAGNOSIS — I2510 Atherosclerotic heart disease of native coronary artery without angina pectoris: Secondary | ICD-10-CM

## 2021-12-21 DIAGNOSIS — E785 Hyperlipidemia, unspecified: Secondary | ICD-10-CM | POA: Diagnosis not present

## 2021-12-21 DIAGNOSIS — R079 Chest pain, unspecified: Secondary | ICD-10-CM | POA: Diagnosis not present

## 2021-12-21 DIAGNOSIS — R55 Syncope and collapse: Secondary | ICD-10-CM

## 2021-12-21 DIAGNOSIS — I739 Peripheral vascular disease, unspecified: Secondary | ICD-10-CM

## 2021-12-21 DIAGNOSIS — Z9861 Coronary angioplasty status: Secondary | ICD-10-CM | POA: Diagnosis not present

## 2021-12-21 MED ORDER — NITROGLYCERIN 0.4 MG SL SUBL: 0.4000 mg | SUBLINGUAL_TABLET | SUBLINGUAL | 11 refills | Status: DC | PRN

## 2021-12-21 NOTE — Patient Instructions (Signed)
Medication Instructions:  Your physician recommends that you continue on your current medications as directed. Please refer to the Current Medication list given to you today.   *If you need a refill on your cardiac medications before your next appointment, please call your pharmacy*   Lab Work NONE ordered at this time of appointment   If you have labs (blood work) drawn today and your tests are completely normal, you will receive your results only by: [place] (if you have MyChart) OR A paper copy in the mail If you have any lab test that is abnormal or we need to change your treatment, we will call you to review the results.   Testing/Procedures Schedule LEAs (previously ordered)  Follow-Up: At [facility], you and your health needs are our priority.  As part of our continuing mission to provide you with exceptional heart care, we have created designated Provider Care Teams.  These Care Teams include your primary Cardiologist (physician) and Advanced Practice Providers (APPs -  Physician Assistants and Nurse Practitioners) who all work together to provide you with the care you need, when you need it.  We recommend signing up for the patient portal called "MyChart".  Sign up information is provided on this After Visit Summary.  MyChart is used to connect with patients for Virtual Visits (Telemedicine).  Patients are able to view lab/test results, encounter notes, upcoming appointments, etc.  Non-urgent messages can be sent to your provider as well.   To learn more about what you can do with MyChart, go to [email or web address].    Your next appointment:    Next available   The format for your next appointment:   In Person  Provider:   Sinclair Grooms, MD     Other Instructions   Important Information About [place]

## 2021-12-22 DIAGNOSIS — I10 Essential (primary) hypertension: Secondary | ICD-10-CM | POA: Diagnosis not present

## 2021-12-22 DIAGNOSIS — E7800 Pure hypercholesterolemia, unspecified: Secondary | ICD-10-CM | POA: Diagnosis not present

## 2021-12-22 DIAGNOSIS — E118 Type 2 diabetes mellitus with unspecified complications: Secondary | ICD-10-CM | POA: Diagnosis not present

## 2021-12-30 ENCOUNTER — Inpatient Hospital Stay: Payer: Medicare Other | Attending: Hematology and Oncology

## 2021-12-30 ENCOUNTER — Telehealth: Payer: Self-pay

## 2021-12-30 ENCOUNTER — Other Ambulatory Visit: Payer: Self-pay | Admitting: Hematology and Oncology

## 2021-12-30 ENCOUNTER — Inpatient Hospital Stay: Payer: Medicare Other

## 2021-12-30 NOTE — Telephone Encounter (Signed)
Called and left a message asking her to call the office back to reschedule today's missed appts.

## 2022-01-03 ENCOUNTER — Ambulatory Visit (HOSPITAL_COMMUNITY)
Admission: RE | Admit: 2022-01-03 | Discharge: 2022-01-03 | Disposition: A | Discharge status: Home / Self Care | Payer: Medicare Other | Source: Ambulatory Visit | Attending: Internal Medicine | Admitting: Internal Medicine

## 2022-01-03 DIAGNOSIS — I739 Peripheral vascular disease, unspecified: Secondary | ICD-10-CM | POA: Insufficient documentation

## 2022-01-06 ENCOUNTER — Encounter (INDEPENDENT_AMBULATORY_CARE_PROVIDER_SITE_OTHER): Payer: Medicare Other | Admitting: Ophthalmology

## 2022-01-06 DIAGNOSIS — E113512 Type 2 diabetes mellitus with proliferative diabetic retinopathy with macular edema, left eye: Secondary | ICD-10-CM | POA: Diagnosis not present

## 2022-01-06 DIAGNOSIS — H35033 Hypertensive retinopathy, bilateral: Secondary | ICD-10-CM

## 2022-01-06 DIAGNOSIS — H43813 Vitreous degeneration, bilateral: Secondary | ICD-10-CM | POA: Diagnosis not present

## 2022-01-06 DIAGNOSIS — I10 Essential (primary) hypertension: Secondary | ICD-10-CM

## 2022-01-06 DIAGNOSIS — E113591 Type 2 diabetes mellitus with proliferative diabetic retinopathy without macular edema, right eye: Secondary | ICD-10-CM | POA: Diagnosis not present

## 2022-01-19 ENCOUNTER — Other Ambulatory Visit: Payer: Self-pay | Admitting: Interventional Cardiology

## 2022-01-23 NOTE — Progress Notes (Unsigned)
Cardiology Office Note:    Date:  01/25/2022   ID:  Stanton Kidney L. Marzetta, Lanza 1945-06-23, MRN [id number]  PCP:  Caren Macadam, MD  Cardiologist:  Sinclair Grooms, MD   Referring MD: Caren Macadam, MD   Chief Complaint  Patient presents with   Coronary Artery Disease    History of Present Illness:    [name] is a 76 y.o. female with a hx of  multiple cardiac risk factors including type 2 diabetes, hypertension, hyperlipidemia, CAD with angina pectoris (cath 04/17/18 - 90% apical LAD, 50-70% Cfx, RCA, & PDA), and prior brain tumor, COVID-19 disease in January 2021, LVEF 60% 4098 with diastolic dysfunction, PAD followed by Dr. [name] related to bilateral superficial femoral artery obstructive disease although she continues to have some left greater than right claudication and progressing bilaterally.   She is having difficulty with ambulation.  She has PAD.  Recent studies revealed no severe obstruction.  Her major problem now is that when she ambulates she is somewhat off balance and she has had at least 3 falls.  States that she can be walking and the next thing she knows she will be on the ground.  She also had an episode where she ended up on the floor from a sitting position.  She does not believe she is passing out.  This most recent episode related to falling out of the chair was witnessed by grandchildren.  There is not close enough observation to determine if she had transient loss of consciousness.  Past Medical History:  Diagnosis Date   Allergy    Anemia    Asthma    Blood transfusion without reported diagnosis 12/23/2004   with colon cancer   Brain tumor (benign) ([place])    Colon cancer ([place]) Nov. 27,  2006   COVID-19 04/2019   DEPRESSIVE DISORDER, NOS 06/08/2006   Qualifier: Diagnosis of  By: [name]     Diabetes mellitus    Type 2   Dysphagia 11/17/2014   ENDOSCOPIC IMPRESSION: 1) Single distal esophageal erison - superficial and 5-7 mm. Biopsied 2) S/P  fundoplication - GE junction dilated to 20 mm (18-19-20 mm balloon) 3) Otherwise normal except some tortuosity of esophagus RECOMMENDATIONS: 1. Clear liquids until 1100, then soft foods rest of day. Resume prior diet tomorrow. 2. Office will call with results 3. If persistent dysphagia anticipate Ba   Erythropoietin (EPO) stimulating agent anemia management patient 10/01/2020   EXTERNAL HEMORRHOIDS 10/23/2001   Qualifier: Diagnosis of  By: Nolon Rod CMA (AAMA), Robin     Female bladder prolapse    Gastritis    GERD (gastroesophageal reflux disease)    Glaucoma    Hip pain 10/30/2019   History of colon cancer 06/08/2006   Status post resection (right colectomy) in 2006. Tumor stage was T3 N0 IIA. Received 4 cycles of FOLFOX and 8 cycles of chemotherapy with 5 fluorouracil leucovorin and 5FU from 04/27/2005 through 10/13/2005 Colonoscopy 06/2011 no polyps (suspected polyp was not true polyp) - routine repeat colonoscopy about 06/2016   Hypercholesterolemia    Hypertension    Itching 01/09/2012   For the past 3 weeks she has noticed full body itching.  She has not noticed any specific areas for the itching is worse.  She has tried to apply triamcinolone and petroleum jelly to reduce the itching.  This does not seem to be effective so far.  She denies right upper quadrant pain and denies any history of illicit drug use.  She has not had issues in the past with bedbugs nor does she have any    Malignant neoplasm of colon ([place]) 06/28/2019   MENINGIOMA 02/07/2007   Qualifier: Diagnosis of  By: [name], HEIDI     Pain in both feet 12/14/2017   Patient reports that she had has had some foot pain bilaterally for the past 7 years.  She reports that it seems to have gotten worse in the past 2 months.  This pain is not burning.  She reports good sensation in both feet.  She sometimes has trouble walking due to the pain.  It is described as though she has a wound on her foot.  She has not had recent trauma to her feet  and denies open wounds o   Unstable angina ([place]) 01/15/2019    Past Surgical History:  Procedure Laterality Date   ABDOMINAL AORTOGRAM W/LOWER EXTREMITY Left 03/23/2020   Procedure: ABDOMINAL AORTOGRAM W/LOWER EXTREMITY;  Surgeon: Lorretta Harp, MD;  Location: [facility];  Service: Cardiovascular;  Laterality: Left;   ABDOMINAL HYSTERECTOMY  1976   BALLOON DILATION N/A 08/23/2017   Procedure: BALLOON DILATION;  Surgeon: Milus Banister, MD;  Location: [facility];  Service: Endoscopy;  Laterality: N/A;  Esophageal Diltation   bladder tac     CARDIAC CATHETERIZATION  2005   CATARACT EXTRACTION, BILATERAL     COLONOSCOPY     COLONOSCOPY W/ BIOPSIES     CORONARY STENT INTERVENTION N/A 01/17/2019   Procedure: CORONARY STENT INTERVENTION;  Surgeon: Nelva Bush, MD;  Location: [facility];  Service: Cardiovascular;  Laterality: N/A;   EMBOLIZATION Left 03/23/2020   Procedure: EMBOLIZATION;  Surgeon: Lorretta Harp, MD;  Location: [place] [facility];  Service: Cardiovascular;  Laterality: Left;  SFA AND DCB   ESOPHAGEAL MANOMETRY N/A 03/20/2015   Procedure: ESOPHAGEAL MANOMETRY (EM);  Surgeon: Gatha Mayer, MD;  Location: [facility];  Service: Endoscopy;  Laterality: N/A;   ESOPHAGOGASTRODUODENOSCOPY (EGD) WITH PROPOFOL N/A 08/23/2017   Procedure: ESOPHAGOGASTRODUODENOSCOPY (EGD) WITH PROPOFOL;  Surgeon: Milus Banister, MD;  [facility]: [facility];  Service: Endoscopy;  Laterality: N/A;   EYE SURGERY     mole removed left eye   INTERSTIM IMPLANT PLACEMENT  08/25/2016   Medtronic Bladder control device: Operator: Bjorn Loser, MD ([place])   INTRAVASCULAR PRESSURE WIRE/FFR STUDY N/A 01/17/2019   Procedure: INTRAVASCULAR PRESSURE WIRE/FFR STUDY;  Surgeon: Nelva Bush, MD;  Location: [place] [facility];  Service: Cardiovascular;  Laterality: N/A;   LAPAROSCOPIC NISSEN FUNDOPLICATION  6599   LEFT HEART CATH AND CORONARY ANGIOGRAPHY N/A 04/17/2018    Procedure: LEFT HEART CATH AND CORONARY ANGIOGRAPHY;  Surgeon: Belva Crome, MD;  Location: [facility];  Service: Cardiovascular;  Laterality: N/A;   LEFT HEART CATH AND CORONARY ANGIOGRAPHY N/A 01/17/2019   Procedure: LEFT HEART CATH AND CORONARY ANGIOGRAPHY;  Surgeon: Nelva Bush, MD;  Location: [facility];  Service: Cardiovascular;  Laterality: N/A;   LOWER EXTREMITY ANGIOGRAPHY  05/31/2021   Procedure: Lower Extremity Angiography;  Surgeon: Lorretta Harp, MD;  Location: [facility];  Service: Cardiovascular;;   PERIPHERAL VASCULAR ATHERECTOMY  05/31/2021   Procedure: PERIPHERAL VASCULAR ATHERECTOMY;  Surgeon: Lorretta Harp, MD;  Location: [place] [facility];  Service: Cardiovascular;;  RT SFA   repair prolapsedbladder     RIGHT COLECTOMY  03/07/2005   TMJ ARTHROPLASTY     UPPER GASTROINTESTINAL ENDOSCOPY      Current  Medications: Current Meds  Medication Sig   amLODipine (NORVASC) 5 MG tablet TAKE ONE TABLET BY MOUTH EVERY MORNING   aspirin 81 MG chewable tablet Chew 81 mg by mouth daily.   clopidogrel (PLAVIX) 75 MG tablet TAKE ONE TABLET BY MOUTH EVERY MORNING   dapagliflozin propanediol (FARXIGA) 10 MG TABS tablet Take 10 mg by mouth daily.   famotidine (PEPCID) 20 MG tablet TAKE 1 TABLET(20 MG) BY MOUTH TWICE DAILY AS NEEDED FOR HEARTBURN OR INDIGESTION   glipiZIDE (GLUCOTROL) 5 MG tablet Take 5 mg by mouth daily before breakfast.   isosorbide mononitrate (IMDUR) 60 MG 24 hr tablet Take 60 mg by mouth daily.   meclizine (ANTIVERT) 25 MG tablet SMARTSIG:1 Tablet(s) By Mouth Every 12 Hours PRN   polyethylene glycol powder (GLYCOLAX/MIRALAX) 17 GM/SCOOP powder Take 17 g by mouth daily. (Patient taking differently: Take 17 g by mouth daily as needed for moderate constipation.)   prednisoLONE acetate (PRED FORTE) 1 % ophthalmic suspension Place 1 drop into the left eye.   quinapril-hydrochlorothiazide (ACCURETIC) 20-12.5 MG tablet TAKE ONE TABLET BY  MOUTH EVERY MORNING   rosuvastatin (CRESTOR) 20 MG tablet TAKE ONE TABLET BY MOUTH EVERY MORNING   timolol (TIMOPTIC) 0.5 % ophthalmic solution Place 1 drop into both eyes daily.   [DISCONTINUED] nitroGLYCERIN (NITROSTAT) 0.4 MG SL tablet Place 1 tablet (0.4 mg total) under the tongue every 5 (five) minutes as needed for chest pain.   Current Facility-Administered Medications for the 01/25/22 encounter (Office Visit) with Belva Crome, MD  Medication   sodium chloride flush (NS) 0.9 % injection 3 mL     Allergies:   Metformin and related   Social History   Socioeconomic History   Marital status: Legally Separated    Spouse name: Not on file   Number of children: 2   Years of education: Not on file   Highest education level: Not on file  Occupational History   Not on file  Tobacco Use   Smoking status: Never   Smokeless tobacco: Never  Vaping Use   Vaping Use: Never used  Substance and Sexual Activity   Alcohol use: No   Drug use: No   Sexual activity: Not on file  Other Topics Concern   Not on file  Social History Narrative   Not on file   Social Determinants of Health   Financial Resource Strain: Not on file  Food Insecurity: No Food Insecurity (10/22/2021)   Hunger Vital Sign    Worried About Running Out of Food in the Last Year: Never true    Ran Out of Food in the Last Year: Never true  Transportation Needs: No Transportation Needs (10/22/2021)   PRAPARE - Hydrologist (Medical): No    Lack of Transportation (Non-Medical): No  Physical Activity: Not on file  Stress: Not on file  Social Connections: Not on file     Family History: The patient's family history includes Bone cancer in her father; Diabetes in her daughter; Heart attack in her daughter; Hypertension in her daughter; Lymphoma in an other family member; Suicidality in her son. There is no history of Colon cancer or Stomach cancer.  ROS:   Please see the history of  present illness.    Episodes of falling and difficulty with gait all other systems reviewed and are negative.  EKGs/Labs/Other Studies Reviewed:    The following studies were reviewed today: Bilateral lower extremity Doppler study January 03, 2022: Summary:  Right:  Mild progression is noted compared to previous study.   Heterogeneous plaque throughout.  30-49% stenosis in the proximal SFA, distal to ostium, high end range, VR  1.9.  Essentially stable, diffuse, mildly elevated velocities throughout the mid  SFA consistent with 1-49% stenosis, s/p atherectomy and angioplasty.  30-49% stenosis in the distal SFA/proximal popliteal artery.  Diffuse, mildly elevated velocities throughout the popliteal artery  consistent with 30-49% stenosis.   Left: No significant change as compared to previous study.   Heterogeneous plaque throughout.  30-49% stenosis in the proximal CFA.  Tandem lesions in the proximal SFA distal to ostium; essentially stable  50-74% stenosis in the proximal SFA.  Essentially stable, diffuse, mildly elevated velocities throughout the mid  SFA consistent with 1-49% stenosis, s/p atherectomy and angioplasty.  30-49% stenosis in the proximal and mid SFA.      See table(s) above for measurements and observations.  See ABI report.  Suggest follow up study in 12 months, or vascular consult if clinically  indicated.    EKG:  EKG no new data  Recent Labs: 07/29/2021: ALT 13; Magnesium 2.6 11/09/2021: BUN 42; Creatinine, Ser 1.61; Potassium 3.3; Sodium 142 12/02/2021: Hemoglobin 10.8; Platelets 227  Recent Lipid Panel    Component Value Date/Time   CHOL 83 (L) 12/08/2020 1051   TRIG 95 12/08/2020 1051   HDL 42 12/08/2020 1051   CHOLHDL 2.0 12/08/2020 1051   CHOLHDL 3.8 07/20/2015 1641   VLDL 74 (H) 07/20/2015 1641   LDLCALC 23 12/08/2020 1051    Physical Exam:    VS:  BP (!) 126/52   Pulse 61   Ht '5\' 4"'$  (1.626 m)   Wt 132 lb 12.8 oz (60.2 kg)   SpO2 98%    BMI 22.80 kg/m     Wt Readings from Last 3 Encounters:  01/25/22 132 lb 12.8 oz (60.2 kg)  12/21/21 132 lb (59.9 kg)  11/09/21 134 lb 0.6 oz (60.8 kg)     GEN: Looks reasonably well. No acute distress HEENT: Normal NECK: No JVD. LYMPHATICS: No lymphadenopathy CARDIAC: No murmur. RRR no gallop, or edema. VASCULAR: Diminished pedal pulses pulses. No bruits. RESPIRATORY:  Clear to auscultation without rales, wheezing or rhonchi  ABDOMEN: Soft, non-tender, non-distended, No pulsatile mass, MUSCULOSKELETAL: No deformity  SKIN: Warm and dry NEUROLOGIC:  Alert and oriented x 3 PSYCHIATRIC:  Normal affect   ASSESSMENT:    1. CAD S/P percutaneous coronary angioplasty   2. Impaired ambulation   3. Claudication in peripheral vascular disease ([place])   4. Hyperlipidemia associated with type 2 diabetes mellitus ([place])   5. PAD (peripheral artery disease) ([place])   6. Essential hypertension   7. Controlled type 2 diabetes mellitus without complication, with long-term current use of insulin ([place])   8. Chest pain of unknown etiology    PLAN:    In order of problems listed above:  She is not currently having angina and I suspect it is because she is not able to ambulate very far because of imbalance. There may be a neurologic component associated with the impaired ambulation this leading to falls.  Also the episode when she fell out of her seat raises a question of cerebral hypoperfusion.  I will refer her for neurological evaluation because of progressive difficulty with gait.  We will also do a 2-week monitor to exclude rhythm disturbance that could be causing transient syncope and falls. See above recent lower extremity Doppler study. Continue high intensity statin therapy. Dr. Quay Burow follows  her PAD and will be seeing her in 1 year.   We will let Dr. [name] decide if he will follow her as her cardiologist for general needs and peripheral artery disease or if she should also  establish with a general cardiologist.  Plan today neurology consult concerning gait abnormality. 2-week monitor to exclude a rhythm disturbance that could be causing syncope.    Medication Adjustments/Labs and Tests Ordered: Current medicines are reviewed at length with the patient today.  Concerns regarding medicines are outlined above.  Orders Placed This Encounter  Procedures   Ambulatory referral to Neurology   LONG TERM MONITOR (3-14 DAYS)   Meds ordered this encounter  Medications   nitroGLYCERIN (NITROSTAT) 0.4 MG SL tablet    Sig: Place 1 tablet (0.4 mg total) under the tongue every 5 (five) minutes as needed for chest pain.    Dispense:  25 tablet    Refill:  5    Patient Instructions  Medication Instructions:  Your physician recommends that you continue on your current medications as directed. Please refer to the Current Medication list given to you today.  *If you need a refill on your cardiac medications before your next appointment, please call your pharmacy*  Lab Work: NONE  Testing/Procedures: Your physician has requested you wear a Zio heart monitor for 2 weeks (14 days). The monitor will be mailed to your home with instructions on how to apply the monitor and how to return it when finished. Please allow 2 weeks after returning the heart monitor before our office calls you with the results.  Follow-Up: At [facility], you and your health needs are our priority.  As part of our continuing mission to provide you with exceptional heart care, we have created designated Provider Care Teams.  These Care Teams include your primary Cardiologist (physician) and Advanced Practice Providers (APPs -  Physician Assistants and Nurse Practitioners) who all work together to provide you with the care you need, when you need it.  Your next appointment:   1 year(s)  The format for your next appointment:   In Person  Provider:   Quay Burow, MD  Other  Instructions We have referred you to [facility] to see a neurologist for frequent falls/abnormal gait. Their office will call you to schedule an appointment. Their phone number is [phone].  Important Information About Sugar         Signed, Sinclair Grooms, MD  01/25/2022 3:41 PM    [facility]

## 2022-01-24 DIAGNOSIS — I10 Essential (primary) hypertension: Secondary | ICD-10-CM | POA: Diagnosis not present

## 2022-01-24 DIAGNOSIS — E7800 Pure hypercholesterolemia, unspecified: Secondary | ICD-10-CM | POA: Diagnosis not present

## 2022-01-24 DIAGNOSIS — E113553 Type 2 diabetes mellitus with stable proliferative diabetic retinopathy, bilateral: Secondary | ICD-10-CM | POA: Diagnosis not present

## 2022-01-25 ENCOUNTER — Other Ambulatory Visit: Payer: Self-pay | Admitting: Interventional Cardiology

## 2022-01-25 ENCOUNTER — Ambulatory Visit (INDEPENDENT_AMBULATORY_CARE_PROVIDER_SITE_OTHER): Payer: Medicare Other

## 2022-01-25 ENCOUNTER — Ambulatory Visit: Payer: Medicare Other | Attending: Interventional Cardiology | Admitting: Interventional Cardiology

## 2022-01-25 ENCOUNTER — Encounter: Payer: Self-pay | Admitting: Interventional Cardiology

## 2022-01-25 VITALS — BP 126/52 | HR 61 | Ht 64.0 in | Wt 132.8 lb

## 2022-01-25 DIAGNOSIS — E119 Type 2 diabetes mellitus without complications: Secondary | ICD-10-CM

## 2022-01-25 DIAGNOSIS — R55 Syncope and collapse: Secondary | ICD-10-CM

## 2022-01-25 DIAGNOSIS — Z9861 Coronary angioplasty status: Secondary | ICD-10-CM | POA: Diagnosis not present

## 2022-01-25 DIAGNOSIS — I10 Essential (primary) hypertension: Secondary | ICD-10-CM | POA: Diagnosis not present

## 2022-01-25 DIAGNOSIS — I2510 Atherosclerotic heart disease of native coronary artery without angina pectoris: Secondary | ICD-10-CM

## 2022-01-25 DIAGNOSIS — R262 Difficulty in walking, not elsewhere classified: Secondary | ICD-10-CM

## 2022-01-25 DIAGNOSIS — Z794 Long term (current) use of insulin: Secondary | ICD-10-CM

## 2022-01-25 DIAGNOSIS — I739 Peripheral vascular disease, unspecified: Secondary | ICD-10-CM | POA: Diagnosis not present

## 2022-01-25 DIAGNOSIS — R296 Repeated falls: Secondary | ICD-10-CM

## 2022-01-25 DIAGNOSIS — E1169 Type 2 diabetes mellitus with other specified complication: Secondary | ICD-10-CM | POA: Diagnosis not present

## 2022-01-25 DIAGNOSIS — E785 Hyperlipidemia, unspecified: Secondary | ICD-10-CM | POA: Diagnosis not present

## 2022-01-25 DIAGNOSIS — R079 Chest pain, unspecified: Secondary | ICD-10-CM | POA: Diagnosis not present

## 2022-01-25 MED ORDER — NITROGLYCERIN 0.4 MG SL SUBL: 0.4000 mg | SUBLINGUAL_TABLET | SUBLINGUAL | 5 refills | Status: DC | PRN

## 2022-01-25 NOTE — Patient Instructions (Signed)
Medication Instructions:  Your physician recommends that you continue on your current medications as directed. Please refer to the Current Medication list given to you today.  *If you need a refill on your cardiac medications before your next appointment, please call your pharmacy*  Lab Work: NONE  Testing/Procedures: Your physician has requested you wear a Zio heart monitor for 2 weeks (14 days). The monitor will be mailed to your home with instructions on how to apply the monitor and how to return it when finished. Please allow 2 weeks after returning the heart monitor before our office calls you with the results.  Follow-Up: At [facility], you and your health needs are our priority.  As part of our continuing mission to provide you with exceptional heart care, we have created designated Provider Care Teams.  These Care Teams include your primary Cardiologist (physician) and Advanced Practice Providers (APPs -  Physician Assistants and Nurse Practitioners) who all work together to provide you with the care you need, when you need it.  Your next appointment:   1 year(s)  The format for your next appointment:   In Person  Provider:   Quay Burow, MD  Other Instructions We have referred you to [facility] to see a neurologist for frequent falls/abnormal gait. Their office will call you to schedule an appointment. Their phone number is [phone].  Important Information About Sugar

## 2022-01-25 NOTE — Progress Notes (Unsigned)
Enrolled for [employer] to mail a ZIO XT long term holter monitor to the patients address on file.  

## 2022-01-27 ENCOUNTER — Inpatient Hospital Stay: Payer: Medicare Other

## 2022-01-27 ENCOUNTER — Inpatient Hospital Stay: Payer: Medicare Other | Admitting: Hematology and Oncology

## 2022-01-29 DIAGNOSIS — R262 Difficulty in walking, not elsewhere classified: Secondary | ICD-10-CM | POA: Diagnosis not present

## 2022-01-29 DIAGNOSIS — R296 Repeated falls: Secondary | ICD-10-CM

## 2022-01-29 DIAGNOSIS — R55 Syncope and collapse: Secondary | ICD-10-CM | POA: Diagnosis not present

## 2022-02-03 ENCOUNTER — Inpatient Hospital Stay: Payer: Medicare Other | Admitting: Hematology and Oncology

## 2022-02-03 ENCOUNTER — Inpatient Hospital Stay: Payer: Medicare Other

## 2022-02-03 ENCOUNTER — Inpatient Hospital Stay: Payer: Medicare Other | Attending: Hematology and Oncology

## 2022-02-03 ENCOUNTER — Inpatient Hospital Stay (HOSPITAL_BASED_OUTPATIENT_CLINIC_OR_DEPARTMENT_OTHER): Payer: Medicare Other | Admitting: Hematology and Oncology

## 2022-02-03 ENCOUNTER — Other Ambulatory Visit: Payer: Self-pay

## 2022-02-03 ENCOUNTER — Encounter: Payer: Self-pay | Admitting: Hematology and Oncology

## 2022-02-03 VITALS — BP 155/55 | HR 63 | Temp 97.6°F | Resp 18 | Ht 64.0 in | Wt 134.8 lb

## 2022-02-03 DIAGNOSIS — E1159 Type 2 diabetes mellitus with other circulatory complications: Secondary | ICD-10-CM

## 2022-02-03 DIAGNOSIS — N1832 Chronic kidney disease, stage 3b: Secondary | ICD-10-CM

## 2022-02-03 DIAGNOSIS — D539 Nutritional anemia, unspecified: Secondary | ICD-10-CM

## 2022-02-03 DIAGNOSIS — I129 Hypertensive chronic kidney disease with stage 1 through stage 4 chronic kidney disease, or unspecified chronic kidney disease: Secondary | ICD-10-CM | POA: Insufficient documentation

## 2022-02-03 DIAGNOSIS — Z23 Encounter for immunization: Secondary | ICD-10-CM | POA: Insufficient documentation

## 2022-02-03 DIAGNOSIS — E1122 Type 2 diabetes mellitus with diabetic chronic kidney disease: Secondary | ICD-10-CM | POA: Insufficient documentation

## 2022-02-03 DIAGNOSIS — I152 Hypertension secondary to endocrine disorders: Secondary | ICD-10-CM | POA: Diagnosis not present

## 2022-02-03 DIAGNOSIS — D631 Anemia in chronic kidney disease: Secondary | ICD-10-CM | POA: Insufficient documentation

## 2022-02-03 LAB — CBC WITH DIFFERENTIAL/PLATELET
Abs Immature Granulocytes: 0.02 10*3/uL (ref 0.00–0.07)
Basophils Absolute: 0.1 10*3/uL (ref 0.0–0.1)
Basophils Relative: 1 %
Eosinophils Absolute: 0.2 10*3/uL (ref 0.0–0.5)
Eosinophils Relative: 2 %
HCT: 28.5 % — ABNORMAL LOW (ref 36.0–46.0)
Hemoglobin: 9.3 g/dL — ABNORMAL LOW (ref 12.0–15.0)
Immature Granulocytes: 0 %
Lymphocytes Relative: 26 %
Lymphs Abs: 2 10*3/uL (ref 0.7–4.0)
MCH: 28.6 pg (ref 26.0–34.0)
MCHC: 32.6 g/dL (ref 30.0–36.0)
MCV: 87.7 fL (ref 80.0–100.0)
Monocytes Absolute: 0.5 10*3/uL (ref 0.1–1.0)
Monocytes Relative: 6 %
Neutro Abs: 5 10*3/uL (ref 1.7–7.7)
Neutrophils Relative %: 65 %
Platelets: 196 10*3/uL (ref 150–400)
RBC: 3.25 MIL/uL — ABNORMAL LOW (ref 3.87–5.11)
RDW: 14.5 % (ref 11.5–15.5)
WBC: 7.7 10*3/uL (ref 4.0–10.5)
nRBC: 0 % (ref 0.0–0.2)

## 2022-02-03 MED ORDER — EPOETIN ALFA-EPBX 40000 UNIT/ML IJ SOLN
40000.0000 [IU] | Freq: Once | INTRAMUSCULAR | Status: AC
  Administered 2022-02-03: 40000 [IU] via SUBCUTANEOUS
  Filled 2022-02-03: qty 1

## 2022-02-03 MED ORDER — INFLUENZA VAC A&B SA ADJ QUAD 0.5 ML IM PRSY
0.5000 mL | PREFILLED_SYRINGE | Freq: Once | INTRAMUSCULAR | Status: AC
  Administered 2022-02-03: 0.5 mL via INTRAMUSCULAR
  Filled 2022-02-03: qty 0.5

## 2022-02-03 NOTE — Progress Notes (Signed)
[place] OFFICE PROGRESS NOTE  Caren Macadam, MD  ASSESSMENT & PLAN:  Deficiency anemia Overall, she has anemia of chronic renal failure She tolerated increased dose of Procrit 40,000 units; based on the trend of his recent blood work, she will get an injection every 5 to 6 weeks I plan to see her again in 6 months for further follow-up  Chronic kidney disease, stage 3b ([place]) She has intermittent acute on chronic renal failure We discussed the importance of close monitoring and follow-up by nephrologist along with risk factor modifications Renal function is stable so far  Hypertension associated with diabetes ([place]) Her blood pressure is elevated today but in general, her blood pressure is normal She will continue medical management  No orders of the defined types were placed in this encounter.   The total time spent in the appointment was 25 minutes encounter with patients including review of chart and various tests results, discussions about plan of care and coordination of care plan   All questions were answered. The patient knows to call the clinic with any problems, questions or concerns. No barriers to learning was detected.    Heath Lark, MD 10/26/202312:36 PM  INTERVAL HISTORY: [name] 76 y.o. female returns for treatment follow-up on erythropoietin stimulating agents She tolerated injection well The patient denies any recent signs or symptoms of bleeding such as spontaneous epistaxis, hematuria or hematochezia.  SUMMARY OF HEMATOLOGIC HISTORY: The patient was seen by multiple different physicians in the past She was originally seen by Dr. Ralene Ok, and then Dr. Juliann Mule, and last seen here in 2016 by Dr. Benay Spice She has remote history of colon cancer diagnosed in 2006, stage II (T3 N0), status post a right colectomy 03/07/2005. She received adjuvant FOLFOX for 4 cycles followed by 8 cycles of 5-FU/leucovorin.  She had imaging study with CT  scan of the abdomen and pelvis in 2019 which show no evidence of recurrence.  Her last colonoscopy was on July 14, 2016 which show no evidence of disease.  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  No other abnormalities were noted She also have history of meningioma and follow with neurosurgery for periodic imaging study.  Her last MRI of the brain was in Aug 11, 2015 which show stable meningioma  She was found to have abnormal CBC from recent blood work.  On review of her blood work, it is noted that she has progressive anemia over the last few years, low was around 8.8 In July of this year, she had iron studies which showed normal iron storage.  She is noted to have progressive renal disease. Starting around 2018, she had progressive renal dysfunction, with serum creatinine fluctuate usually under 2 but in January of this year, her creatinine went up to as high as 2.2. Estimated EGFR put her at chronic kidney disease stage III.  She has multiple cardiovascular risk factors including poorly controlled diabetes, peripheral vascular disease, chronic kidney disease, coronary artery disease and hypertension  She denies recent chest pain on exertion, shortness of breath on minimal exertion, pre-syncopal episodes, or palpitations. She had not noticed any recent bleeding such as epistaxis, hematuria or hematochezia The patient denies over the counter NSAID ingestion. She is on antiplatelets agents.  She denies any pica and eats a variety of diet. She had received blood transfusion when she was diagnosed with colon cancer Her granddaughter noticed  recurrent falls.  She denies generalized weakness.  She denies peripheral neuropathy from prior treatment or diabetes She is started on Epoetin injection for anemia chronic kidney disease on February 10, 2020   I have reviewed the past medical history, past surgical history, social  history and family history with the patient and they are unchanged from previous note.  ALLERGIES:  is allergic to metformin and related.  MEDICATIONS:  Current Outpatient Medications  Medication Sig Dispense Refill   amLODipine (NORVASC) 5 MG tablet TAKE ONE TABLET BY MOUTH EVERY MORNING 90 tablet 2   aspirin 81 MG chewable tablet Chew 81 mg by mouth daily.     clopidogrel (PLAVIX) 75 MG tablet TAKE ONE TABLET BY MOUTH EVERY MORNING 90 tablet 2   dapagliflozin propanediol (FARXIGA) 10 MG TABS tablet Take 10 mg by mouth daily.     famotidine (PEPCID) 20 MG tablet TAKE 1 TABLET(20 MG) BY MOUTH TWICE DAILY AS NEEDED FOR HEARTBURN OR INDIGESTION 90 tablet 2   glipiZIDE (GLUCOTROL) 5 MG tablet Take 5 mg by mouth daily before breakfast.     isosorbide mononitrate (IMDUR) 60 MG 24 hr tablet Take 60 mg by mouth daily.     meclizine (ANTIVERT) 25 MG tablet SMARTSIG:1 Tablet(s) By Mouth Every 12 Hours PRN     nitroGLYCERIN (NITROSTAT) 0.4 MG SL tablet Place 1 tablet (0.4 mg total) under the tongue every 5 (five) minutes as needed for chest pain. 25 tablet 5   polyethylene glycol powder (GLYCOLAX/MIRALAX) 17 GM/SCOOP powder Take 17 g by mouth daily. (Patient taking differently: Take 17 g by mouth daily as needed for moderate constipation.) 3350 g 1   prednisoLONE acetate (PRED FORTE) 1 % ophthalmic suspension Place 1 drop into the left eye.     quinapril-hydrochlorothiazide (ACCURETIC) 20-12.5 MG tablet TAKE ONE TABLET BY MOUTH EVERY MORNING 90 tablet 3   rosuvastatin (CRESTOR) 20 MG tablet TAKE ONE TABLET BY MOUTH EVERY MORNING 90 tablet 3   timolol (TIMOPTIC) 0.5 % ophthalmic solution Place 1 drop into both eyes daily.     Current Facility-Administered Medications  Medication Dose Route Frequency Provider Last Rate Last Admin   sodium chloride flush (NS) 0.9 % injection 3 mL  3 mL Intravenous Q12H Lorretta Harp, MD         REVIEW OF SYSTEMS:   Constitutional: Denies fevers, chills or night  sweats Eyes: Denies blurriness of vision Ears, nose, mouth, throat, and face: Denies mucositis or sore throat Respiratory: Denies cough, dyspnea or wheezes Cardiovascular: Denies palpitation, chest discomfort or lower extremity swelling Gastrointestinal:  Denies nausea, heartburn or change in bowel habits Skin: Denies abnormal skin rashes Lymphatics: Denies new lymphadenopathy or easy bruising Neurological:Denies numbness, tingling or new weaknesses Behavioral/Psych: Mood is stable, no new changes  All other systems were reviewed with the patient and are negative.  PHYSICAL EXAMINATION: ECOG PERFORMANCE STATUS: 1 - Symptomatic but completely ambulatory  Vitals:   02/03/22 1204  BP: (!) 155/55  Pulse: 63  Resp: 18  Temp: 97.6 F (36.4 C)  SpO2: 98%   Filed Weights   02/03/22 1204  Weight: 134 lb 12.8 oz (61.1 kg)    GENERAL:alert, no distress and comfortable NEURO: alert & oriented x 3 with fluent speech, no focal motor/sensory deficits  LABORATORY DATA:  I have reviewed the data as listed     Component Value Date/Time   NA 142 11/09/2021 2017   NA 149 (H) 05/19/2021 1508   NA 143 05/15/2014 1154  K 3.3 (L) 11/09/2021 2017   K 3.6 05/15/2014 1154   CL 113 (H) 11/09/2021 2017   CL 101 05/11/2012 1544   CO2 24 11/09/2021 2017   CO2 24 05/15/2014 1154   GLUCOSE 229 (H) 11/09/2021 2017   GLUCOSE 143 (H) 05/15/2014 1154   GLUCOSE 272 (H) 05/11/2012 1544   BUN 42 (H) 11/09/2021 2017   BUN 37 (H) 05/19/2021 1508   BUN 17.3 05/15/2014 1154   CREATININE 1.61 (H) 11/09/2021 2017   CREATININE 1.48 (H) 10/28/2020 1208   CREATININE 1.06 (H) 07/20/2015 1641   CREATININE 1.0 05/15/2014 1154   CALCIUM 8.8 (L) 11/09/2021 2017   CALCIUM 9.6 05/15/2014 1154   PROT 7.4 07/29/2021 2210   PROT 6.8 12/08/2020 1051   PROT 7.0 05/15/2014 1154   ALBUMIN 3.9 07/29/2021 2210   ALBUMIN 4.2 12/08/2020 1051   ALBUMIN 3.7 05/15/2014 1154   AST 23 07/29/2021 2210   AST 19 10/28/2020  1208   AST 13 05/15/2014 1154   ALT 13 07/29/2021 2210   ALT 15 10/28/2020 1208   ALT 9 05/15/2014 1154   ALKPHOS 63 07/29/2021 2210   ALKPHOS 94 05/15/2014 1154   BILITOT 0.5 07/29/2021 2210   BILITOT 0.6 12/08/2020 1051   BILITOT 0.7 10/28/2020 1208   BILITOT 0.68 05/15/2014 1154   GFRNONAA 33 (L) 11/09/2021 2017   GFRNONAA 37 (L) 10/28/2020 1208   GFRNONAA 54 (L) 07/20/2015 1641   GFRAA 36 (L) 04/09/2020 1024   GFRAA 62 07/20/2015 1641    No results found for: "SPEP", "UPEP"  Lab Results  Component Value Date   WBC 7.7 02/03/2022   NEUTROABS 5.0 02/03/2022   HGB 9.3 (L) 02/03/2022   HCT 28.5 (L) 02/03/2022   MCV 87.7 02/03/2022   PLT 196 02/03/2022      Chemistry      Component Value Date/Time   NA 142 11/09/2021 2017   NA 149 (H) 05/19/2021 1508   NA 143 05/15/2014 1154   K 3.3 (L) 11/09/2021 2017   K 3.6 05/15/2014 1154   CL 113 (H) 11/09/2021 2017   CL 101 05/11/2012 1544   CO2 24 11/09/2021 2017   CO2 24 05/15/2014 1154   BUN 42 (H) 11/09/2021 2017   BUN 37 (H) 05/19/2021 1508   BUN 17.3 05/15/2014 1154   CREATININE 1.61 (H) 11/09/2021 2017   CREATININE 1.48 (H) 10/28/2020 1208   CREATININE 1.06 (H) 07/20/2015 1641   CREATININE 1.0 05/15/2014 1154      Component Value Date/Time   CALCIUM 8.8 (L) 11/09/2021 2017   CALCIUM 9.6 05/15/2014 1154   ALKPHOS 63 07/29/2021 2210   ALKPHOS 94 05/15/2014 1154   AST 23 07/29/2021 2210   AST 19 10/28/2020 1208   AST 13 05/15/2014 1154   ALT 13 07/29/2021 2210   ALT 15 10/28/2020 1208   ALT 9 05/15/2014 1154   BILITOT 0.5 07/29/2021 2210   BILITOT 0.6 12/08/2020 1051   BILITOT 0.7 10/28/2020 1208   BILITOT 0.68 05/15/2014 1154

## 2022-02-03 NOTE — Assessment & Plan Note (Signed)
Overall, she has anemia of chronic renal failure She tolerated increased dose of Procrit 40,000 units; based on the trend of his recent blood work, she will get an injection every 5 to 6 weeks I plan to see her again in 6 months for further follow-up

## 2022-02-03 NOTE — Assessment & Plan Note (Signed)
Her blood pressure is elevated today but in general, her blood pressure is normal She will continue medical management

## 2022-02-03 NOTE — Assessment & Plan Note (Signed)
She has intermittent acute on chronic renal failure We discussed the importance of close monitoring and follow-up by nephrologist along with risk factor modifications Renal function is stable so far

## 2022-02-03 NOTE — Patient Instructions (Signed)
Epoetin Alfa injection ?What is this medication? ?EPOETIN ALFA (e POE e tin AL fa) helps your body make more red blood cells. This medicine is used to treat anemia caused by chronic kidney disease, cancer chemotherapy, or HIV-therapy. It may also be used before surgery if you have anemia. ?This medicine may be used for other purposes; ask your health care provider or pharmacist if you have questions. ?COMMON BRAND NAME(S): Epogen, Procrit, Retacrit ?What should I tell my care team before I take this medication? ?They need to know if you have any of these conditions: ?cancer ?heart disease ?high blood pressure ?history of blood clots ?history of stroke ?low levels of folate, iron, or vitamin B12 in the blood ?seizures ?an unusual or allergic reaction to erythropoietin, albumin, benzyl alcohol, hamster proteins, other medicines, foods, dyes, or preservatives ?pregnant or trying to get pregnant ?breast-feeding ?How should I use this medication? ?This medicine is for injection into a vein or under the skin. It is usually given by a health care professional in a hospital or clinic setting. ?If you get this medicine at home, you will be taught how to prepare and give this medicine. Use exactly as directed. Take your medicine at regular intervals. Do not take your medicine more often than directed. ?It is important that you put your used needles and syringes in a special sharps container. Do not put them in a trash can. If you do not have a sharps container, call your pharmacist or healthcare provider to get one. ?A special MedGuide will be given to you by the pharmacist with each prescription and refill. Be sure to read this information carefully each time. ?Talk to your pediatrician regarding the use of this medicine in children. While this drug may be prescribed for selected conditions, precautions do apply. ?Overdosage: If you think you have taken too much of this medicine contact a poison control center or emergency  room at once. ?NOTE: This medicine is only for you. Do not share this medicine with others. ?What if I miss a dose? ?If you miss a dose, take it as soon as you can. If it is almost time for your next dose, take only that dose. Do not take double or extra doses. ?What may interact with this medication? ?Interactions have not been studied. ?This list may not describe all possible interactions. Give your health care provider a list of all the medicines, herbs, non-prescription drugs, or dietary supplements you use. Also tell them if you smoke, drink alcohol, or use illegal drugs. Some items may interact with your medicine. ?What should I watch for while using this medication? ?Your condition will be monitored carefully while you are receiving this medicine. ?You may need blood work done while you are taking this medicine. ?This medicine may cause a decrease in vitamin B6. You should make sure that you get enough vitamin B6 while you are taking this medicine. Discuss the foods you eat and the vitamins you take with your health care professional. ?What side effects may I notice from receiving this medication? ?Side effects that you should report to your doctor or health care professional as soon as possible: ?allergic reactions like skin rash, itching or hives, swelling of the face, lips, or tongue ?seizures ?signs and symptoms of a blood clot such as breathing problems; changes in vision; chest pain; severe, sudden headache; pain, swelling, warmth in the leg; trouble speaking; sudden numbness or weakness of the face, arm or leg ?signs and symptoms of a stroke like   changes in vision; confusion; trouble speaking or understanding; severe headaches; sudden numbness or weakness of the face, arm or leg; trouble walking; dizziness; loss of balance or coordination ?Side effects that usually do not require medical attention (report to your doctor or health care professional if they continue or are  bothersome): ?chills ?cough ?dizziness ?fever ?headaches ?joint pain ?muscle cramps ?muscle pain ?nausea, vomiting ?pain, redness, or irritation at site where injected ?This list may not describe all possible side effects. Call your doctor for medical advice about side effects. You may report side effects to FDA at 1-800-FDA-1088. ?Where should I keep my medication? ?Keep out of the reach of children. ?Store in a refrigerator between 2 and 8 degrees C (36 and 46 degrees F). Do not freeze or shake. Throw away any unused portion if using a single-dose vial. Multi-dose vials can be kept in the refrigerator for up to 21 days after the initial dose. Throw away unused medicine. ?NOTE: This sheet is a summary. It may not cover all possible information. If you have questions about this medicine, talk to your doctor, pharmacist, or health care provider. ?? 2023 Elsevier/Gold Standard (2016-11-29 00:00:00) ? ?

## 2022-02-04 ENCOUNTER — Encounter (INDEPENDENT_AMBULATORY_CARE_PROVIDER_SITE_OTHER): Payer: Medicare Other | Admitting: Ophthalmology

## 2022-02-04 DIAGNOSIS — E113512 Type 2 diabetes mellitus with proliferative diabetic retinopathy with macular edema, left eye: Secondary | ICD-10-CM | POA: Diagnosis not present

## 2022-02-04 DIAGNOSIS — E113591 Type 2 diabetes mellitus with proliferative diabetic retinopathy without macular edema, right eye: Secondary | ICD-10-CM | POA: Diagnosis not present

## 2022-02-04 DIAGNOSIS — H43813 Vitreous degeneration, bilateral: Secondary | ICD-10-CM | POA: Diagnosis not present

## 2022-02-04 DIAGNOSIS — H35372 Puckering of macula, left eye: Secondary | ICD-10-CM | POA: Diagnosis not present

## 2022-02-04 DIAGNOSIS — H35033 Hypertensive retinopathy, bilateral: Secondary | ICD-10-CM | POA: Diagnosis not present

## 2022-02-04 DIAGNOSIS — I10 Essential (primary) hypertension: Secondary | ICD-10-CM | POA: Diagnosis not present

## 2022-02-16 DIAGNOSIS — R296 Repeated falls: Secondary | ICD-10-CM | POA: Diagnosis not present

## 2022-02-16 DIAGNOSIS — R55 Syncope and collapse: Secondary | ICD-10-CM | POA: Diagnosis not present

## 2022-02-17 ENCOUNTER — Other Ambulatory Visit: Payer: Self-pay | Admitting: Interventional Cardiology

## 2022-02-17 DIAGNOSIS — I25118 Atherosclerotic heart disease of native coronary artery with other forms of angina pectoris: Secondary | ICD-10-CM | POA: Diagnosis not present

## 2022-02-17 DIAGNOSIS — I10 Essential (primary) hypertension: Secondary | ICD-10-CM | POA: Diagnosis not present

## 2022-02-17 DIAGNOSIS — E113553 Type 2 diabetes mellitus with stable proliferative diabetic retinopathy, bilateral: Secondary | ICD-10-CM | POA: Diagnosis not present

## 2022-02-17 DIAGNOSIS — E7800 Pure hypercholesterolemia, unspecified: Secondary | ICD-10-CM | POA: Diagnosis not present

## 2022-02-22 DIAGNOSIS — I739 Peripheral vascular disease, unspecified: Secondary | ICD-10-CM | POA: Diagnosis not present

## 2022-02-22 DIAGNOSIS — Z0000 Encounter for general adult medical examination without abnormal findings: Secondary | ICD-10-CM | POA: Diagnosis not present

## 2022-02-22 DIAGNOSIS — I25118 Atherosclerotic heart disease of native coronary artery with other forms of angina pectoris: Secondary | ICD-10-CM | POA: Diagnosis not present

## 2022-02-22 DIAGNOSIS — Z1211 Encounter for screening for malignant neoplasm of colon: Secondary | ICD-10-CM | POA: Diagnosis not present

## 2022-02-22 DIAGNOSIS — Z23 Encounter for immunization: Secondary | ICD-10-CM | POA: Diagnosis not present

## 2022-02-22 DIAGNOSIS — E7800 Pure hypercholesterolemia, unspecified: Secondary | ICD-10-CM | POA: Diagnosis not present

## 2022-02-22 DIAGNOSIS — I10 Essential (primary) hypertension: Secondary | ICD-10-CM | POA: Diagnosis not present

## 2022-02-22 DIAGNOSIS — E118 Type 2 diabetes mellitus with unspecified complications: Secondary | ICD-10-CM | POA: Diagnosis not present

## 2022-02-22 DIAGNOSIS — R2681 Unsteadiness on feet: Secondary | ICD-10-CM | POA: Diagnosis not present

## 2022-02-22 DIAGNOSIS — N1832 Chronic kidney disease, stage 3b: Secondary | ICD-10-CM | POA: Diagnosis not present

## 2022-02-23 ENCOUNTER — Other Ambulatory Visit: Payer: Self-pay | Admitting: Family Medicine

## 2022-02-23 DIAGNOSIS — R2681 Unsteadiness on feet: Secondary | ICD-10-CM

## 2022-02-28 ENCOUNTER — Telehealth: Payer: Self-pay | Admitting: Interventional Cardiology

## 2022-02-28 MED ORDER — LOSARTAN POTASSIUM-HCTZ 50-12.5 MG PO TABS: 1.0000 | ORAL_TABLET | Freq: Every day | ORAL | 3 refills | Status: DC

## 2022-02-28 NOTE — Telephone Encounter (Signed)
Pt c/o medication issue:  1. Name of Medication:  quinapril-hydrochlorothiazide (ACCURETIC) 20-12.5 MG tablet   2. How are you currently taking this medication (dosage and times per day)? Not currently taking   3. Are you having a reaction (difficulty breathing--STAT)? No   4. What is your medication issue? Pharmacy is calling stating they are not able to get this medication due to not being able to find it anywhere so they are requesting an alternative be prescribed.

## 2022-02-28 NOTE — Telephone Encounter (Signed)
Left detailed message for patient regarding medication change.  Per Dr. Tamala Julian: Faythe Ghee to switch to Losartan Hctz 50/12.5 mg daily   Medication list updated.  Provided office number for callback to discuss medication change.  Spoke with Arielle at [facility] and informed her of alternative medication. Rx sent. Arielle verbalized understanding and expressed appreciation for follow-up.

## 2022-03-07 ENCOUNTER — Ambulatory Visit (INDEPENDENT_AMBULATORY_CARE_PROVIDER_SITE_OTHER): Payer: Medicare Other | Admitting: Neurology

## 2022-03-07 ENCOUNTER — Encounter: Payer: Self-pay | Admitting: Neurology

## 2022-03-07 VITALS — BP 120/53 | HR 61 | Ht 63.0 in | Wt 132.4 lb

## 2022-03-07 DIAGNOSIS — R42 Dizziness and giddiness: Secondary | ICD-10-CM | POA: Diagnosis not present

## 2022-03-07 DIAGNOSIS — R296 Repeated falls: Secondary | ICD-10-CM

## 2022-03-07 DIAGNOSIS — R269 Unspecified abnormalities of gait and mobility: Secondary | ICD-10-CM | POA: Diagnosis not present

## 2022-03-07 NOTE — Progress Notes (Signed)
Subjective:    Patient ID: [name] is a 76 y.o. female.  HPI    Star Age, MD, PhD [facility] [address] [place], [place] [address]  Dear Dr. Tamala Julian,  I saw your patient, [name], upon your kind request in my neurologic clinic today for initial consultation of her balance problems and falls.  The patient is accompanied by her daughter today.  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  Symptoms started in August but are gradual.  She has had lightheadedness but not all the time.  She denies any spinning sensation.  Sometimes she has a headache after she receives her shot for left eye retinopathy.  She does have a bladder stimulator in place and is supposed to see her nephrologist.  She has chronic kidney disease.  She tries to hydrate well, estimates that she drinks about 3-4 bottles of water per day, 16.9 ounce size.  She drinks caffeine in the form of coffee, 1 or 2 cups in the mornings, no alcohol.  She is a non-smoker.  She denies any sudden onset of one-sided weakness or numbness or tingling or droopy face or slurring of speech.  Her falls typically occur when she is upright and walking.  She does not actually pass out as far she knows.  Sometimes she feels that she is going to fall and usually it is a feeling of falling forward.  She has not used a cane or walker.  I reviewed your office note from10/17/2023. She had a Zio patch  recently from 01/29/2022 through 02/12/2022.  She had several runs of SVT, she had ventricular bigeminy and trigeminy, no evidence of A-fib. Her primary care physician has ordered a brain MRI but I am not sure if she can have an MRI as she has a bladder stimulator.  She has not yet heard about scheduling the MRI.  Her Past Medical History Is Significant For: Past Medical History:  Diagnosis Date   Allergy    Anemia    Asthma    Blood transfusion without reported diagnosis 12/23/2004   with colon cancer   Brain tumor (benign) ([facility]    Colon cancer ([facility] 03/07/2005   COVID-19 04/2019   DEPRESSIVE DISORDER, NOS 06/08/2006   Qualifier: Diagnosis of  By: OSamara Snide    Diabetes mellitus    Type 2   Dysphagia 11/17/2014   ENDOSCOPIC IMPRESSION: 1) Single distal esophageal erison - superficial and 5-7 mm. Biopsied 2) S/P fundoplication - GE junction dilated to 20 mm (18-19-20 mm balloon) 3) Otherwise normal except some tortuosity of esophagus RECOMMENDATIONS: 1. Clear liquids until 1100, then soft foods rest of day. Resume prior diet tomorrow. 2. Office will call with results 3. If persistent dysphagia anticipate Ba   Erythropoietin (EPO) stimulating agent anemia management patient 10/01/2020   EXTERNAL HEMORRHOIDS 10/23/2001   Qualifier: Diagnosis of  By: SNolon RodCMA (AAMA), Robin     Female bladder prolapse    Gastritis    GERD (gastroesophageal reflux disease)    Glaucoma    Hip pain 10/30/2019  History of colon cancer 06/08/2006   Status post resection (right colectomy) in 2006. Tumor stage was T3 N0 IIA. Received 4 cycles of FOLFOX and 8 cycles of chemotherapy with 5 fluorouracil leucovorin and 5FU from 04/27/2005 through 10/13/2005 Colonoscopy 06/2011 no polyps (suspected polyp was not true polyp) - routine repeat colonoscopy about 06/2016   Hypercholesterolemia    Hypertension    Itching 01/09/2012   For the past 3 weeks she has noticed full body itching.  She has not noticed any  specific areas for the itching is worse.  She has tried to apply triamcinolone and petroleum jelly to reduce the itching.  This does not seem to be effective so far.  She denies right upper quadrant pain and denies any history of illicit drug use.  She has not had issues in the past with bedbugs nor does she have any    Malignant neoplasm of colon ([facility]) 06/28/2019   MENINGIOMA 02/07/2007   Qualifier: Diagnosis of  By: [name], HEIDI     Pain in both feet 12/14/2017   Patient reports that she had has had some foot pain bilaterally for the past 7 years.  She reports that it seems to have gotten worse in the past 2 months.  This pain is not burning.  She reports good sensation in both feet.  She sometimes has trouble walking due to the pain.  It is described as though she has a wound on her foot.  She has not had recent trauma to her feet and denies open wounds o   Unstable angina ([facility]) 01/15/2019   Vertigo    Pt Reported    Her Past Surgical History Is Significant For: Past Surgical History:  Procedure Laterality Date   ABDOMINAL AORTOGRAM W/LOWER EXTREMITY Left 03/23/2020   Procedure: ABDOMINAL AORTOGRAM W/LOWER EXTREMITY;  Surgeon: Lorretta Harp, MD;  Location: [facility];  Service: Cardiovascular;  Laterality: Left;   ABDOMINAL HYSTERECTOMY  1976   BALLOON DILATION N/A 08/23/2017   Procedure: BALLOON DILATION;  Surgeon: Milus Banister, MD;  Location: [facility];  Service: Endoscopy;  Laterality: N/A;  Esophageal Diltation   bladder tac     CARDIAC CATHETERIZATION  2005   CATARACT EXTRACTION, BILATERAL     COLONOSCOPY     COLONOSCOPY W/ BIOPSIES     CORONARY STENT INTERVENTION N/A 01/17/2019   Procedure: CORONARY STENT INTERVENTION;  Surgeon: Nelva Bush, MD;  Location: [facility];  Service: Cardiovascular;  Laterality: N/A;   EMBOLIZATION Left 03/23/2020   Procedure: EMBOLIZATION;  Surgeon: Lorretta Harp, MD;  Location: [facility];  Service:  Cardiovascular;  Laterality: Left;  SFA AND DCB   ESOPHAGEAL MANOMETRY N/A 03/20/2015   Procedure: ESOPHAGEAL MANOMETRY (EM);  Surgeon: Gatha Mayer, MD;  Location: [facility];  Service: Endoscopy;  Laterality: N/A;   ESOPHAGOGASTRODUODENOSCOPY (EGD) WITH PROPOFOL N/A 08/23/2017   Procedure: ESOPHAGOGASTRODUODENOSCOPY (EGD) WITH PROPOFOL;  Surgeon: Milus Banister, MD;  Location: [facility];  Service: Endoscopy;  Laterality: N/A;   EYE SURGERY     mole removed left eye   INTERSTIM IMPLANT PLACEMENT  08/25/2016   Medtronic Bladder control device: Operator: Bjorn Loser, MD ([place])   INTRAVASCULAR PRESSURE WIRE/FFR STUDY N/A 01/17/2019   Procedure: INTRAVASCULAR PRESSURE WIRE/FFR STUDY;  Surgeon: Nelva Bush, MD;  Location: [facility];  Service: Cardiovascular;  Laterality: N/A;   LAPAROSCOPIC NISSEN FUNDOPLICATION  6720   LEFT HEART CATH AND CORONARY ANGIOGRAPHY N/A 04/17/2018  Procedure: LEFT HEART CATH AND CORONARY ANGIOGRAPHY;  Surgeon: Belva Crome, MD;  Location: [facility];  Service: Cardiovascular;  Laterality: N/A;   LEFT HEART CATH AND CORONARY ANGIOGRAPHY N/A 01/17/2019   Procedure: LEFT HEART CATH AND CORONARY ANGIOGRAPHY;  Surgeon: Nelva Bush, MD;  Location: [facility];  Service: Cardiovascular;  Laterality: N/A;   LOWER EXTREMITY ANGIOGRAPHY  05/31/2021   Procedure: Lower Extremity Angiography;  Surgeon: Lorretta Harp, MD;  Location: [facility];  Service: Cardiovascular;;   PERIPHERAL VASCULAR ATHERECTOMY  05/31/2021   Procedure: PERIPHERAL VASCULAR ATHERECTOMY;  Surgeon: Lorretta Harp, MD;  Location: [facility];  Service: Cardiovascular;;  RT SFA   repair prolapsedbladder     RIGHT COLECTOMY  03/07/2005   TMJ ARTHROPLASTY     UPPER GASTROINTESTINAL ENDOSCOPY      Her Family History Is Significant For: Family History  Problem Relation Age of Onset   Bone cancer Father    Diabetes Father    Hypotension  Father    Kidney failure Father    Diabetes Daughter    Hypertension Daughter    Heart attack Daughter    Suicidality Son        deceased   Lymphoma Other    Colon cancer Neg Hx    Stomach cancer Neg Hx     Her Social History Is Significant For: Social History   Socioeconomic History   Marital status: Legally Separated    Spouse name: Not on file   Number of children: 2   Years of education: Not on file   Highest education level: Not on file  Occupational History   Not on file  Tobacco Use   Smoking status: Never   Smokeless tobacco: Never  Vaping Use   Vaping Use: Never used  Substance and Sexual Activity   Alcohol use: No   Drug use: No   Sexual activity: Not on file  Other Topics Concern   Not on file  Social History Narrative   Right Handed   1-2 Cups of Coffee   Social Determinants of Health   Financial Resource Strain: Not on file  Food Insecurity: No Food Insecurity (10/22/2021)   Hunger Vital Sign    Worried About Running Out of Food in the Last Year: Never true    Ran Out of Food in the Last Year: Never true  Transportation Needs: No Transportation Needs (10/22/2021)   PRAPARE - Hydrologist (Medical): No    Lack of Transportation (Non-Medical): No  Physical Activity: Not on file  Stress: Not on file  Social Connections: Not on file    Her Allergies Are:  Allergies  Allergen Reactions   Metformin And Related Diarrhea  :   Her Current Medications Are:  Outpatient Encounter Medications as of 03/07/2022  Medication Sig   amLODipine (NORVASC) 5 MG tablet TAKE ONE TABLET BY MOUTH EVERY MORNING   aspirin 81 MG chewable tablet Chew 81 mg by mouth daily.   clopidogrel (PLAVIX) 75 MG tablet TAKE ONE TABLET BY MOUTH EVERY MORNING   dapagliflozin propanediol (FARXIGA) 10 MG TABS tablet Take 10 mg by mouth daily.   famotidine (PEPCID) 20 MG tablet TAKE 1 TABLET(20 MG) BY MOUTH TWICE DAILY AS NEEDED FOR HEARTBURN OR INDIGESTION    glipiZIDE (GLUCOTROL) 5 MG tablet Take 5 mg by mouth daily before breakfast.   isosorbide mononitrate (IMDUR) 60 MG 24 hr tablet TAKE ONE TABLET BY MOUTH  ONCE DAILY   losartan-hydrochlorothiazide (HYZAAR) 50-12.5 MG tablet Take 1 tablet by mouth daily.   meclizine (ANTIVERT) 25 MG tablet SMARTSIG:1 Tablet(s) By Mouth Every 12 Hours PRN   nitroGLYCERIN (NITROSTAT) 0.4 MG SL tablet Place 1 tablet (0.4 mg total) under the tongue every 5 (five) minutes as needed for chest pain.   prednisoLONE acetate (PRED FORTE) 1 % ophthalmic suspension Place 1 drop into the left eye.   rosuvastatin (CRESTOR) 20 MG tablet TAKE ONE TABLET BY MOUTH EVERY MORNING   timolol (TIMOPTIC) 0.5 % ophthalmic solution Place 1 drop into both eyes daily.   polyethylene glycol powder (GLYCOLAX/MIRALAX) 17 GM/SCOOP powder Take 17 g by mouth daily. (Patient not taking: Reported on 03/07/2022)   Facility-Administered Encounter Medications as of 03/07/2022  Medication   sodium chloride flush (NS) 0.9 % injection 3 mL  :   Review of Systems:  Out of a complete 14 point review of systems, all are reviewed and negative with the exception of these symptoms as listed below:  Review of Systems  Neurological:        Pt is here with her Daughter. Pt is here because she is having Falls. Pt has been diagnosed with a brain tumor in 2008.No new falls within the last 30 days. Pt states she [place] unaware of what happens when she falls.  Pt has fallen 4x since August.     Objective:  Neurological Exam  Physical Exam Physical Examination:   Vitals:   03/07/22 1454  BP: (!) 120/53  Pulse: 61   Orthostatic testing she has mild symptomatic lightheadedness upon sitting up from the lying position, blood pressure lying down 115/59 with a pulse of 57, sitting 104/54 with a pulse of 67, standing 116/56 with a pulse of 65.  She does not have any further lightheadedness upon standing, no vertiginous symptoms.  General Examination: The patient  is a very pleasant 76 y.o. female in no acute distress. She appears well-developed and well-nourished and well groomed.   HEENT: Normocephalic, atraumatic, pupils are equal, round and reactive to light, extraocular tracking is good without limitation to gaze excursion or nystagmus noted. Hearing is grossly intact. Face is symmetric with questionable mild facial masking noted.  She has no lip, neck or jaw tremor.  She may have mild nuchal rigidity, no carotid bruits.  Oropharynx exam reveals: mild mouth dryness, adequate dental hygiene with partial dentures on top. Tongue protrudes centrally and palate elevates symmetrically.   Chest: Clear to auscultation without wheezing, rhonchi or crackles noted.  Heart: S1+S2+0, regular and normal without murmurs, rubs or gallops noted.   Abdomen: Soft, non-tender and non-distended.  Extremities: There is no pitting edema in the distal lower extremities bilaterally.   Skin: Warm and dry without trophic changes noted.   Musculoskeletal: exam reveals no obvious joint deformities.   Neurologically:  Mental status: The patient is awake, alert and oriented in all 4 spheres. Her immediate and remote memory, attention, language skills and fund of knowledge are appropriate. There is no evidence of aphasia, agnosia, apraxia or anomia. Speech is clear with normal prosody and enunciation. Thought process is linear. Mood is normal and affect is normal.  Cranial nerves II - XII are as described above under HEENT exam.  Motor exam: Normal bulk, strength and tone is noted. There is no obvious action or resting tremor.  Fine motor skills and coordination: She seems to have mild difficulty with finger taps and hand movements and rapid alternating patting, no obvious lateralization,  she has mild difficulty with foot taps bilaterally, without lateralization. Cerebellar testing: No dysmetria or intention tremor. There is no truncal or gait ataxia.  Sensory exam: intact to  light touch in the upper and lower extremities.  Gait, station and balance: She stands with difficulty no assistance, posture is age-appropriate.  She walks with mild insecurity, slightly wide-based, mild decrease in arm swing bilaterally, she does not have any telltale shuffling.  She walks without a walking aid.  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  She has had no obvious sudden trigger, no telltale symptoms of stroke, no severe headaches, examination findings are not telltale for a obvious neurodegenerative condition but she has some softer findings, including mild facial masking, mild nuchal rigidity, mildly decreased arm swing bilaterally, raising the question of parkinsonism but I am not convinced she has idiopathic Parkinson's disease.  She did not have any obvious orthostatic hypotension.  We will proceed with an EEG although her symptoms description is not classic for seizures.  We will await her brain MRI if she can have 1.  It was ordered by her primary care.  I was considering a DaTscan with her but her creatinine level is elevated and I do not think it is safe for [place] to proceed with a brain DaTscan.  For now, we will await EEG results and MRI brain results and plan a follow-up after.  She is advised to start using a cane for gait safety or consider using a  walker.  I answered her questions today and the patient and  her daughter were in agreement.   Thank you very much for allowing me to participate in the care of this nice patient. If I can be of any further assistance to you please do not hesitate to call me at [phone].  Sincerely,   Star Age, MD, PhD

## 2022-03-07 NOTE — Patient Instructions (Addendum)
Unfortunately, dizziness and falls without obvious cause are common complaints but often not due to a primary neurological reason or single underlying medical problem. Often, there a combination of factors, that result in dizziness and/or fall. This includes blood pressure fluctuations, medication side effects, blood sugar fluctuations, stress, vertigo, poor sleep with sleep deprivation, dehydration, and electrolyte disturbance or other metabolic and endocrinological reasons, meaning hormone related problems such as thyroid dysfunction.  Your primary care doctor ordered a brain MRI. I am not sure, you can have an MRI as you have a bladder stimulator. Please check with your urologist first and then check with your PCP about the status of the MRI, if you can have it.   We will investigate things further with an EEG (brainwave test), which we will schedule in this office. We will call you with the results.  I recommend you consider using a walker for safety.

## 2022-03-09 ENCOUNTER — Encounter (INDEPENDENT_AMBULATORY_CARE_PROVIDER_SITE_OTHER): Payer: Medicare Other | Admitting: Ophthalmology

## 2022-03-10 ENCOUNTER — Inpatient Hospital Stay: Payer: 59

## 2022-03-16 ENCOUNTER — Encounter (INDEPENDENT_AMBULATORY_CARE_PROVIDER_SITE_OTHER): Payer: Medicare Other | Admitting: Ophthalmology

## 2022-03-16 DIAGNOSIS — E113512 Type 2 diabetes mellitus with proliferative diabetic retinopathy with macular edema, left eye: Secondary | ICD-10-CM

## 2022-03-16 DIAGNOSIS — I10 Essential (primary) hypertension: Secondary | ICD-10-CM

## 2022-03-16 DIAGNOSIS — H43813 Vitreous degeneration, bilateral: Secondary | ICD-10-CM

## 2022-03-16 DIAGNOSIS — E113591 Type 2 diabetes mellitus with proliferative diabetic retinopathy without macular edema, right eye: Secondary | ICD-10-CM | POA: Diagnosis not present

## 2022-03-16 DIAGNOSIS — H35033 Hypertensive retinopathy, bilateral: Secondary | ICD-10-CM | POA: Diagnosis not present

## 2022-03-17 ENCOUNTER — Inpatient Hospital Stay: Payer: Medicare Other | Attending: Hematology and Oncology

## 2022-03-17 ENCOUNTER — Other Ambulatory Visit: Payer: Self-pay

## 2022-03-17 ENCOUNTER — Inpatient Hospital Stay: Payer: Medicare Other

## 2022-03-17 VITALS — BP 131/51 | HR 60 | Temp 98.4°F | Resp 16

## 2022-03-17 DIAGNOSIS — N1832 Chronic kidney disease, stage 3b: Secondary | ICD-10-CM

## 2022-03-17 DIAGNOSIS — D539 Nutritional anemia, unspecified: Secondary | ICD-10-CM

## 2022-03-17 DIAGNOSIS — D631 Anemia in chronic kidney disease: Secondary | ICD-10-CM | POA: Insufficient documentation

## 2022-03-17 LAB — CBC WITH DIFFERENTIAL/PLATELET
Abs Immature Granulocytes: 0.03 10*3/uL (ref 0.00–0.07)
Basophils Absolute: 0 10*3/uL (ref 0.0–0.1)
Basophils Relative: 1 %
Eosinophils Absolute: 0.2 10*3/uL (ref 0.0–0.5)
Eosinophils Relative: 2 %
HCT: 29.9 % — ABNORMAL LOW (ref 36.0–46.0)
Hemoglobin: 9.6 g/dL — ABNORMAL LOW (ref 12.0–15.0)
Immature Granulocytes: 0 %
Lymphocytes Relative: 30 %
Lymphs Abs: 2.6 10*3/uL (ref 0.7–4.0)
MCH: 28.9 pg (ref 26.0–34.0)
MCHC: 32.1 g/dL (ref 30.0–36.0)
MCV: 90.1 fL (ref 80.0–100.0)
Monocytes Absolute: 0.7 10*3/uL (ref 0.1–1.0)
Monocytes Relative: 8 %
Neutro Abs: 5 10*3/uL (ref 1.7–7.7)
Neutrophils Relative %: 59 %
Platelets: 221 10*3/uL (ref 150–400)
RBC: 3.32 MIL/uL — ABNORMAL LOW (ref 3.87–5.11)
RDW: 14.3 % (ref 11.5–15.5)
WBC: 8.4 10*3/uL (ref 4.0–10.5)
nRBC: 0 % (ref 0.0–0.2)

## 2022-03-17 MED ORDER — EPOETIN ALFA-EPBX 40000 UNIT/ML IJ SOLN
40000.0000 [IU] | Freq: Once | INTRAMUSCULAR | Status: AC
  Administered 2022-03-17: 40000 [IU] via SUBCUTANEOUS
  Filled 2022-03-17: qty 1

## 2022-03-17 NOTE — Patient Instructions (Signed)

## 2022-03-21 DIAGNOSIS — D631 Anemia in chronic kidney disease: Secondary | ICD-10-CM | POA: Diagnosis not present

## 2022-03-21 DIAGNOSIS — E559 Vitamin D deficiency, unspecified: Secondary | ICD-10-CM | POA: Diagnosis not present

## 2022-03-21 DIAGNOSIS — C189 Malignant neoplasm of colon, unspecified: Secondary | ICD-10-CM | POA: Diagnosis not present

## 2022-03-21 DIAGNOSIS — I129 Hypertensive chronic kidney disease with stage 1 through stage 4 chronic kidney disease, or unspecified chronic kidney disease: Secondary | ICD-10-CM | POA: Diagnosis not present

## 2022-03-21 DIAGNOSIS — N1832 Chronic kidney disease, stage 3b: Secondary | ICD-10-CM | POA: Diagnosis not present

## 2022-03-22 DIAGNOSIS — I10 Essential (primary) hypertension: Secondary | ICD-10-CM | POA: Diagnosis not present

## 2022-03-22 DIAGNOSIS — E7800 Pure hypercholesterolemia, unspecified: Secondary | ICD-10-CM | POA: Diagnosis not present

## 2022-03-22 DIAGNOSIS — I25118 Atherosclerotic heart disease of native coronary artery with other forms of angina pectoris: Secondary | ICD-10-CM | POA: Diagnosis not present

## 2022-03-22 DIAGNOSIS — E113553 Type 2 diabetes mellitus with stable proliferative diabetic retinopathy, bilateral: Secondary | ICD-10-CM | POA: Diagnosis not present

## 2022-03-23 ENCOUNTER — Encounter: Payer: Self-pay | Admitting: Internal Medicine

## 2022-03-28 ENCOUNTER — Telehealth: Payer: Self-pay | Admitting: *Deleted

## 2022-03-28 NOTE — Telephone Encounter (Signed)
   Pre-operative Risk Assessment    Patient Name: [name]  DOB: Jul 07, 1945 MRN: [id number]     Request for Surgical Clearance    Procedure:  Dental Extraction - Amount of Teeth to be Pulled:  5 TEETH SURGICALLY EXTRACTED, REMOVE PALATAL TORUS  Date of Surgery:  Clearance TBD                                 Surgeon:  DR. Eulah Citizen Surgeon's Group or Practice Name:  [facility], DMD Phone number:  [phone] Fax number:  [phone]   Type of Clearance Requested:   - Medical  - Pharmacy:  Hold Aspirin and Clopidogrel (Plavix)     Type of Anesthesia:  General    Additional requests/questions:    Jiles Prows   03/28/2022, 4:18 PM

## 2022-03-30 NOTE — Telephone Encounter (Signed)
Hi Dr. Tamala Julian,  Patient is planning on having 5 teeth surgically extracted and removal of palatal torus sometime soon (date TBD) and is being asked to hold Aspirin and Plavix. She has a history of CAD and PAD. It looks like she has been on DAPT since 2020 when cath showed 90% stenosis of apical LAD, 50-70% stenosis of distal LCX, 50-60% of mid RCA, and 60-70% of diffuse mid PDA segment. She did not undergo PCI at that time due to distal location of LAD disease and moderate disease elsewhere. She has had multiple PV interventions, most recently atherectomy followed by drug-coated balloon angioplasty of right SFA.   Given this does not exactly following our pre-op algorithm for management of DAPT, can you please comment on how long patient can hold both Aspirin and Plavix for prior to dental procedure? Please route response back to P CV DIV PREOP.  Thank you! Gibbs Naugle

## 2022-04-01 ENCOUNTER — Ambulatory Visit
Admission: RE | Admit: 2022-04-01 | Discharge: 2022-04-01 | Disposition: A | Discharge status: Home / Self Care | Payer: Medicare Other | Source: Ambulatory Visit | Attending: Family Medicine | Admitting: Family Medicine

## 2022-04-01 DIAGNOSIS — D329 Benign neoplasm of meninges, unspecified: Secondary | ICD-10-CM | POA: Diagnosis not present

## 2022-04-01 DIAGNOSIS — R2681 Unsteadiness on feet: Secondary | ICD-10-CM

## 2022-04-01 DIAGNOSIS — R269 Unspecified abnormalities of gait and mobility: Secondary | ICD-10-CM | POA: Diagnosis not present

## 2022-04-13 ENCOUNTER — Encounter (INDEPENDENT_AMBULATORY_CARE_PROVIDER_SITE_OTHER): Payer: Medicare Other | Admitting: Ophthalmology

## 2022-04-13 DIAGNOSIS — H43813 Vitreous degeneration, bilateral: Secondary | ICD-10-CM

## 2022-04-13 DIAGNOSIS — H35033 Hypertensive retinopathy, bilateral: Secondary | ICD-10-CM | POA: Diagnosis not present

## 2022-04-13 DIAGNOSIS — E113591 Type 2 diabetes mellitus with proliferative diabetic retinopathy without macular edema, right eye: Secondary | ICD-10-CM | POA: Diagnosis not present

## 2022-04-13 DIAGNOSIS — E113512 Type 2 diabetes mellitus with proliferative diabetic retinopathy with macular edema, left eye: Secondary | ICD-10-CM | POA: Diagnosis not present

## 2022-04-13 DIAGNOSIS — I10 Essential (primary) hypertension: Secondary | ICD-10-CM | POA: Diagnosis not present

## 2022-04-14 ENCOUNTER — Inpatient Hospital Stay: Payer: 59 | Attending: Hematology and Oncology

## 2022-04-14 ENCOUNTER — Ambulatory Visit (INDEPENDENT_AMBULATORY_CARE_PROVIDER_SITE_OTHER): Payer: Medicare Other | Admitting: Neurology

## 2022-04-14 ENCOUNTER — Other Ambulatory Visit: Payer: Self-pay

## 2022-04-14 ENCOUNTER — Inpatient Hospital Stay: Payer: 59

## 2022-04-14 VITALS — BP 132/55 | HR 61 | Temp 98.4°F | Resp 16

## 2022-04-14 DIAGNOSIS — R410 Disorientation, unspecified: Secondary | ICD-10-CM | POA: Diagnosis not present

## 2022-04-14 DIAGNOSIS — D631 Anemia in chronic kidney disease: Secondary | ICD-10-CM | POA: Diagnosis not present

## 2022-04-14 DIAGNOSIS — R269 Unspecified abnormalities of gait and mobility: Secondary | ICD-10-CM

## 2022-04-14 DIAGNOSIS — R296 Repeated falls: Secondary | ICD-10-CM

## 2022-04-14 DIAGNOSIS — D539 Nutritional anemia, unspecified: Secondary | ICD-10-CM

## 2022-04-14 DIAGNOSIS — R42 Dizziness and giddiness: Secondary | ICD-10-CM

## 2022-04-14 DIAGNOSIS — N1832 Chronic kidney disease, stage 3b: Secondary | ICD-10-CM

## 2022-04-14 LAB — CBC WITH DIFFERENTIAL/PLATELET
Abs Immature Granulocytes: 0.03 10*3/uL (ref 0.00–0.07)
Basophils Absolute: 0.1 10*3/uL (ref 0.0–0.1)
Basophils Relative: 1 %
Eosinophils Absolute: 0.2 10*3/uL (ref 0.0–0.5)
Eosinophils Relative: 2 %
HCT: 33.3 % — ABNORMAL LOW (ref 36.0–46.0)
Hemoglobin: 10.6 g/dL — ABNORMAL LOW (ref 12.0–15.0)
Immature Granulocytes: 0 %
Lymphocytes Relative: 23 %
Lymphs Abs: 1.9 10*3/uL (ref 0.7–4.0)
MCH: 28.3 pg (ref 26.0–34.0)
MCHC: 31.8 g/dL (ref 30.0–36.0)
MCV: 89 fL (ref 80.0–100.0)
Monocytes Absolute: 0.4 10*3/uL (ref 0.1–1.0)
Monocytes Relative: 5 %
Neutro Abs: 5.8 10*3/uL (ref 1.7–7.7)
Neutrophils Relative %: 69 %
Platelets: 272 10*3/uL (ref 150–400)
RBC: 3.74 MIL/uL — ABNORMAL LOW (ref 3.87–5.11)
RDW: 14.2 % (ref 11.5–15.5)
WBC: 8.3 10*3/uL (ref 4.0–10.5)
nRBC: 0 % (ref 0.0–0.2)

## 2022-04-14 MED ORDER — EPOETIN ALFA-EPBX 40000 UNIT/ML IJ SOLN
40000.0000 [IU] | Freq: Once | INTRAMUSCULAR | Status: AC
  Administered 2022-04-14: 40000 [IU] via SUBCUTANEOUS
  Filled 2022-04-14: qty 1

## 2022-04-14 NOTE — Telephone Encounter (Signed)
   Name: [name]. [place]  DOB: 1945/05/27  MRN: [id number]  Primary Cardiologist: Sinclair Grooms, MD   Preoperative team, please contact this patient and set up a phone call appointment for further preoperative risk assessment. Please obtain consent and complete medication review. Thank you for your help.  I confirm that guidance regarding antiplatelet and oral anticoagulation therapy has been completed and, if necessary, noted below.  Per Dr. Tamala Julian: Hold ASA and plavix 5-7 days and resume when safe after the Dental work. She will need to ask the dentist when she should resume.    Ledora Bottcher, PA 04/14/2022, 4:18 PM [place]

## 2022-04-14 NOTE — Patient Instructions (Signed)

## 2022-04-15 ENCOUNTER — Telehealth: Payer: Self-pay | Admitting: *Deleted

## 2022-04-15 NOTE — Telephone Encounter (Signed)
Left message for the pt to call the office to schedule a tele pre op appt 

## 2022-04-15 NOTE — Telephone Encounter (Signed)
Pt has been scheduled for a tele pre op appt 04/19/22. Med rec and consent are done.     Patient Consent for Virtual Visit        [name] has provided verbal consent on 04/15/2022 for a virtual visit (video or telephone).   CONSENT FOR VIRTUAL VISIT FOR:  [name]  By participating in this virtual visit I agree to the following:  I hereby voluntarily request, consent and authorize [facility] and its employed or contracted physicians, physician assistants, nurse practitioners or other licensed health care professionals (the Practitioner), to provide me with telemedicine health care services (the "Services") as deemed necessary by the treating Practitioner. I acknowledge and consent to receive the Services by the Practitioner via telemedicine. I understand that the telemedicine visit will involve communicating with the Practitioner through live audiovisual communication technology and the disclosure of certain medical information by electronic transmission. I acknowledge that I have been given the opportunity to request an in-person assessment or other available alternative prior to the telemedicine visit and am voluntarily participating in the telemedicine visit.  I understand that I have the right to withhold or withdraw my consent to the use of telemedicine in the course of my care at any time, without affecting my right to future care or treatment, and that the Practitioner or I may terminate the telemedicine visit at any time. I understand that I have the right to inspect all information obtained and/or recorded in the course of the telemedicine visit and may receive copies of available information for a reasonable fee.  I understand that some of the potential risks of receiving the Services via telemedicine include:  Delay or interruption in medical evaluation due to technological equipment failure or disruption; Information transmitted may not be sufficient (e.g. poor  resolution of images) to allow for appropriate medical decision making by the Practitioner; and/or  In rare instances, security protocols could fail, causing a breach of personal health information.  Furthermore, I acknowledge that it is my responsibility to provide information about my medical history, conditions and care that is complete and accurate to the best of my ability. I acknowledge that Practitioner's advice, recommendations, and/or decision may be based on factors not within their control, such as incomplete or inaccurate data provided by me or distortions of diagnostic images or specimens that may result from electronic transmissions. I understand that the practice of medicine is not an exact science and that Practitioner makes no warranties or guarantees regarding treatment outcomes. I acknowledge that a copy of this consent can be made available to me via my patient portal ([facility]), or I can request a printed copy by calling the office of [facility].    I understand that my insurance will be billed for this visit.   I have read or had this consent read to me. I understand the contents of this consent, which adequately explains the benefits and risks of the Services being provided via telemedicine.  I have been provided ample opportunity to ask questions regarding this consent and the Services and have had my questions answered to my satisfaction. I give my informed consent for the services to be provided through the use of telemedicine in my medical care

## 2022-04-15 NOTE — Telephone Encounter (Signed)
Pt has been scheduled for a tele pre op appt 04/19/22. Med rec and consent are done.

## 2022-04-19 ENCOUNTER — Encounter: Payer: Self-pay | Admitting: Hematology and Oncology

## 2022-04-19 ENCOUNTER — Ambulatory Visit: Payer: Medicare Other | Attending: Internal Medicine | Admitting: Physician Assistant

## 2022-04-19 DIAGNOSIS — Z01810 Encounter for preprocedural cardiovascular examination: Secondary | ICD-10-CM

## 2022-04-19 NOTE — Progress Notes (Signed)
Virtual Visit via Telephone Note   Because of [name]'s co-morbid illnesses, she is at least at moderate risk for complications without adequate follow up.  This format is felt to be most appropriate for this patient at this time.  The patient did not have access to video technology/had technical difficulties with video requiring transitioning to audio format only (telephone).  All issues noted in this document were discussed and addressed.  No physical exam could be performed with this format.  Please refer to the patient's chart for her consent to telehealth for [facility].  Evaluation Performed:  Preoperative cardiovascular risk assessment _____________   Date:  04/19/2022   Patient ID:  [name], [name] 07/29/1945, MRN [id number] Patient Location:  Home Provider location:   Office  Primary Care Provider:  Caren Macadam, MD Primary Cardiologist:  Sinclair Grooms, MD  Chief Complaint / Patient Profile   77 y.o. y/o female with a h/o CAD with angina pectoralis (cath 04/17/2018 with 90% apical LAD, 50 to 70% CFX, RCA, and PDA), diabetes melitis type II, hypertension, hyperlipidemia, prior brain tumor, COVID-19 disease January 2021, LVEF 60% 9379 with diastolic dysfunction, and PAD followed by Dr. Alvester Chou related to bilateral superficial femoral artery obstructive disease who is pending dental extraction  and presents today for telephonic preoperative cardiovascular risk assessment.  History of Present Illness    [name] is a 77 y.o. female who presents via Engineer, civil (consulting) for a telehealth visit today.  Pt was last seen in cardiology clinic on 01/25/2022 by Dr. Tamala Julian.  At that time [name] was doing well other than an episode of falling to the floor from a sitting position without passing out.  She is under close observation and does not drive due to this incident.  The patient is now pending procedure as outlined above. Since her last visit, she  feels that she is stable from a cardiac standpoint.  No chest pain or shortness of breath.  No palpitations.  No episodes of syncope.  Her daughter is there to help and helps her with taking care of her home and any driving to appointments.  She does have difficulty walking 1-2 blocks due to her knees giving out.  However, she is able to do a flight of stairs slowly and could do some light to moderate household tasks if needed.  For this reason she scored a 4.73 METS on the DASI.  This exceeds the 4 METS minimum requirement.  Hold ASA and plavix 5-7 days and resume when safe after the Dental work. She will need to ask the dentist when she should resume.    No antibiotics needed for SBE prophylaxis.  Past Medical History    Past Medical History:  Diagnosis Date   Allergy    Anemia    Asthma    Blood transfusion without reported diagnosis 12/23/2004   with colon cancer   Brain tumor (benign) ([place])    Colon cancer ([place]) 03/07/2005   COVID-19 04/2019   DEPRESSIVE DISORDER, NOS 06/08/2006   Qualifier: Diagnosis of  By: [name]     Diabetes mellitus    Type 2   Dysphagia 11/17/2014   ENDOSCOPIC IMPRESSION: 1) Single distal esophageal erison - superficial and 5-7 mm. Biopsied 2) S/P fundoplication - GE junction dilated to 20 mm (18-19-20 mm balloon) 3) Otherwise normal except some tortuosity of esophagus RECOMMENDATIONS: 1. Clear liquids until 1100, then soft foods rest of day. Resume prior  diet tomorrow. 2. Office will call with results 3. If persistent dysphagia anticipate Ba   Erythropoietin (EPO) stimulating agent anemia management patient 10/01/2020   EXTERNAL HEMORRHOIDS 10/23/2001   Qualifier: Diagnosis of  By: Nolon Rod CMA (AAMA), Robin     Female bladder prolapse    Gastritis    GERD (gastroesophageal reflux disease)    Glaucoma    Hip pain 10/30/2019   History of colon cancer 06/08/2006   Status post resection (right colectomy) in 2006. Tumor stage was T3 N0 IIA. Received 4  cycles of FOLFOX and 8 cycles of chemotherapy with 5 fluorouracil leucovorin and 5FU from 04/27/2005 through 10/13/2005 Colonoscopy 06/2011 no polyps (suspected polyp was not true polyp) - routine repeat colonoscopy about 06/2016   Hypercholesterolemia    Hypertension    Itching 01/09/2012   For the past 3 weeks she has noticed full body itching.  She has not noticed any specific areas for the itching is worse.  She has tried to apply triamcinolone and petroleum jelly to reduce the itching.  This does not seem to be effective so far.  She denies right upper quadrant pain and denies any history of illicit drug use.  She has not had issues in the past with bedbugs nor does she have any    Malignant neoplasm of colon ([place]) 06/28/2019   MENINGIOMA 02/07/2007   Qualifier: Diagnosis of  By: [name], HEIDI     Pain in both feet 12/14/2017   Patient reports that she had has had some foot pain bilaterally for the past 7 years.  She reports that it seems to have gotten worse in the past 2 months.  This pain is not burning.  She reports good sensation in both feet.  She sometimes has trouble walking due to the pain.  It is described as though she has a wound on her foot.  She has not had recent trauma to her feet and denies open wounds o   Unstable angina ([place]) 01/15/2019   Vertigo    Pt Reported   Past Surgical History:  Procedure Laterality Date   ABDOMINAL AORTOGRAM W/LOWER EXTREMITY Left 03/23/2020   Procedure: ABDOMINAL AORTOGRAM W/LOWER EXTREMITY;  Surgeon: Lorretta Harp, MD;  Location: [place] [facility];  Service: Cardiovascular;  Laterality: Left;   ABDOMINAL HYSTERECTOMY  1976   BALLOON DILATION N/A 08/23/2017   Procedure: BALLOON DILATION;  Surgeon: Milus Banister, MD;  Location: [facility];  Service: Endoscopy;  Laterality: N/A;  Esophageal Diltation   bladder tac     CARDIAC CATHETERIZATION  2005   CATARACT EXTRACTION, BILATERAL     COLONOSCOPY     COLONOSCOPY W/ BIOPSIES      CORONARY STENT INTERVENTION N/A 01/17/2019   Procedure: CORONARY STENT INTERVENTION;  Surgeon: Nelva Bush, MD;  Location: [place] [facility];  Service: Cardiovascular;  Laterality: N/A;   EMBOLIZATION Left 03/23/2020   Procedure: EMBOLIZATION;  Surgeon: Lorretta Harp, MD;  Location: [place] [facility];  Service: Cardiovascular;  Laterality: Left;  SFA AND DCB   ESOPHAGEAL MANOMETRY N/A 03/20/2015   Procedure: ESOPHAGEAL MANOMETRY (EM);  Surgeon: Gatha Mayer, MD;  Location: [facility];  Service: Endoscopy;  Laterality: N/A;   ESOPHAGOGASTRODUODENOSCOPY (EGD) WITH PROPOFOL N/A 08/23/2017   Procedure: ESOPHAGOGASTRODUODENOSCOPY (EGD) WITH PROPOFOL;  Surgeon: Milus Banister, MD;  Location: [facility];  Service: Endoscopy;  Laterality: N/A;   EYE SURGERY     mole removed left eye   INTERSTIM IMPLANT PLACEMENT  08/25/2016  Medtronic Bladder control device: Operator: Bjorn Loser, MD ([place])   INTRAVASCULAR PRESSURE WIRE/FFR STUDY N/A 01/17/2019   Procedure: INTRAVASCULAR PRESSURE WIRE/FFR STUDY;  Surgeon: Nelva Bush, MD;  Location: [place] [facility];  Service: Cardiovascular;  Laterality: N/A;   LAPAROSCOPIC NISSEN FUNDOPLICATION  7902   LEFT HEART CATH AND CORONARY ANGIOGRAPHY N/A 04/17/2018   Procedure: LEFT HEART CATH AND CORONARY ANGIOGRAPHY;  Surgeon: Belva Crome, MD;  Location: [place] [facility];  Service: Cardiovascular;  Laterality: N/A;   LEFT HEART CATH AND CORONARY ANGIOGRAPHY N/A 01/17/2019   Procedure: LEFT HEART CATH AND CORONARY ANGIOGRAPHY;  Surgeon: Nelva Bush, MD;  Location: [place] [facility];  Service: Cardiovascular;  Laterality: N/A;   LOWER EXTREMITY ANGIOGRAPHY  05/31/2021   Procedure: Lower Extremity Angiography;  Surgeon: Lorretta Harp, MD;  Location: [place] [facility];  Service: Cardiovascular;;   PERIPHERAL VASCULAR ATHERECTOMY  05/31/2021   Procedure: PERIPHERAL VASCULAR ATHERECTOMY;  Surgeon: Lorretta Harp, MD;  Location: [place] [facility];  Service: Cardiovascular;;  RT SFA   repair prolapsedbladder     RIGHT COLECTOMY  03/07/2005   TMJ ARTHROPLASTY     UPPER GASTROINTESTINAL ENDOSCOPY      Allergies  Allergies  Allergen Reactions   Metformin And Related Diarrhea    Home Medications    Prior to Admission medications   Medication Sig Start Date End Date Taking? Authorizing Provider  amLODipine (NORVASC) 5 MG tablet TAKE ONE TABLET BY MOUTH EVERY MORNING 12/21/21   Belva Crome, MD  aspirin 81 MG chewable tablet Chew 81 mg by mouth daily.    [provider]  clopidogrel (PLAVIX) 75 MG tablet TAKE ONE TABLET BY MOUTH EVERY MORNING 12/21/21   Belva Crome, MD  dapagliflozin propanediol (FARXIGA) 10 MG TABS tablet Take 10 mg by mouth daily.    [provider]  famotidine (PEPCID) 20 MG tablet TAKE 1 TABLET(20 MG) BY MOUTH TWICE DAILY AS NEEDED FOR HEARTBURN OR INDIGESTION 09/02/20   [name], MD  glipiZIDE (GLUCOTROL XL) 10 MG 24 hr tablet Take 10 mg by mouth daily. 03/29/22   [provider]  glipiZIDE (GLUCOTROL XL) 5 MG 24 hr tablet Take 5 mg by mouth daily. 01/28/22   [provider]  glipiZIDE (GLUCOTROL) 5 MG tablet Take 5 mg by mouth daily before breakfast.    [provider]  isosorbide mononitrate (IMDUR) 60 MG 24 hr tablet TAKE ONE TABLET BY MOUTH ONCE DAILY 02/18/22   Belva Crome, MD  losartan-hydrochlorothiazide (HYZAAR) 50-12.5 MG tablet Take 1 tablet by mouth daily. 02/28/22   Belva Crome, MD  meclizine (ANTIVERT) 25 MG tablet SMARTSIG:1 Tablet(s) By Mouth Every 12 Hours PRN 08/30/21   [provider]  nitroGLYCERIN (NITROSTAT) 0.4 MG SL tablet Place 1 tablet (0.4 mg total) under the tongue every 5 (five) minutes as needed for chest pain. 01/25/22   Belva Crome, MD  polyethylene glycol powder [facility]) 17 GM/SCOOP powder Take 17 g by mouth daily. Patient not taking: Reported on 03/07/2022 10/01/20   McDiarmid, Blane Ohara, MD   prednisoLONE acetate (PRED FORTE) 1 % ophthalmic suspension Place 1 drop into the left eye. 09/05/21   [provider]  PRODIGY NO CODING BLOOD GLUC test strip TESTING ONCE DAILY. 03/07/22   [provider]  Prodigy Twist Top Lancets 28G MISC TESTING ONCE DAILY. 03/06/22   [provider]  rosuvastatin (CRESTOR) 20 MG tablet TAKE ONE TABLET BY MOUTH EVERY MORNING  01/19/22   Belva Crome, MD  rosuvastatin (CRESTOR) 5 MG tablet Take 5 mg by mouth daily. 03/29/22   [provider]  timolol (TIMOPTIC) 0.5 % ophthalmic solution Place 1 drop into both eyes daily. 09/23/21   [provider]  trimethoprim-polymyxin b (POLYTRIM) ophthalmic solution SMARTSIG:In Eye(s) 03/16/22   [provider]    Physical Exam    Vital Signs:  [name] does not have vital signs available for review today.  Given telephonic nature of communication, physical exam is limited. AAOx3. NAD. Normal affect.  Speech and respirations are unlabored.  Accessory Clinical Findings    None  Assessment & Plan    1.  Preoperative Cardiovascular Risk Assessment:  [name] perioperative risk of a major cardiac event is 0.9% according to the Revised Cardiac Risk Index (RCRI).  Therefore, she is at low risk for perioperative complications.   Her functional capacity is poor at 4.73 METs according to the [place] Activity Status Index (DASI). Recommendations: According to ACC/AHA guidelines, no further cardiovascular testing needed.  The patient may proceed to surgery at acceptable risk.   Antiplatelet and/or Anticoagulation Recommendations: Aspirin can be held for 5-7 days prior to her surgery.  Please resume Aspirin post operatively when it is felt to be safe from a bleeding standpoint.  Clopidogrel (Plavix) can be held for 5-7 days prior to her surgery and resumed as soon as possible post op.  The patient was advised that if she develops new symptoms prior to surgery  to contact our office to arrange for a follow-up visit, and she verbalized understanding.  No antibiotics needed for SBE prophylaxis.   A copy of this note will be routed to requesting surgeon.  Time:   Today, I have spent 10 minutes with the patient with telehealth technology discussing medical history, symptoms, and management plan.     Elgie Collard, PA-C  04/19/2022, 3:04 PM

## 2022-04-19 NOTE — Procedures (Signed)
    History:  77 year old woman with recurrent falls  EEG classification:  Awake and asleep  Description of the recording: The background rhythms of this recording consists of a fairly well modulated medium amplitude background activity of 8-9 Hz. As the record progresses, the patient initially is in the waking state, but appears to enter the early stage II sleep during the recording, with rudimentary sleep spindles and vertex sharp wave activity seen. During the wakeful state, photic stimulation is performed, and no abnormal responses were seen. Hyperventilation was also performed, no abnormal response seen. No epileptiform discharges seen during this recording. There was no focal slowing.   Abnormality: None   Impression: This is a normal EEG recording in the waking and sleeping state. No evidence of interictal epileptiform discharges. Normal EEGs, however, do not rule out epilepsy.    Alric Ran, MD [facility]

## 2022-04-20 DIAGNOSIS — I10 Essential (primary) hypertension: Secondary | ICD-10-CM | POA: Diagnosis not present

## 2022-04-20 DIAGNOSIS — E113553 Type 2 diabetes mellitus with stable proliferative diabetic retinopathy, bilateral: Secondary | ICD-10-CM | POA: Diagnosis not present

## 2022-04-20 DIAGNOSIS — I25118 Atherosclerotic heart disease of native coronary artery with other forms of angina pectoris: Secondary | ICD-10-CM | POA: Diagnosis not present

## 2022-04-20 DIAGNOSIS — E7800 Pure hypercholesterolemia, unspecified: Secondary | ICD-10-CM | POA: Diagnosis not present

## 2022-04-27 ENCOUNTER — Encounter: Payer: Self-pay | Admitting: Hematology and Oncology

## 2022-04-28 ENCOUNTER — Encounter: Payer: Self-pay | Admitting: Hematology and Oncology

## 2022-04-29 NOTE — Progress Notes (Unsigned)
05/05/2022 [name] [id number] 02-Mar-1946  Referring provider: Caren Macadam, MD Primary GI doctor: Dr. Carlean Purl  ASSESSMENT AND PLAN:   Iron deficiency anemia, unspecified iron deficiency anemia type Likely from CKD stage 3b, last iron was unremarkable Will recheck CBC, iron/ferritin With history of colon cancer and dysphagia, will plan on EGD with dilatation and colon to evaluate anemia, dysphagia  History of colon cancer  07/14/2016 colonoscopy Dr. Carlean Purl: End-to-side ileocolonic anastomosis characterized by healthy-appearing mucosa, otherwise normal exam.   Repeat recommended in 5 years. 07/2021 Will do bedside commode for prepping, per patient request  Esophageal dysphagia with GERD 08/2017 EGD  with dilation to 20 mm of the GE junction status post fundoplication.  Get on pepcid daily, may need PPI Will plan on EGD with dilatation  CAD S/P percutaneous coronary angioplasty, PAD s/p RSFA 05/2021 No chest pain, no SOB.  Patient told to hold her Plavix for 5 days prior to time of procedure. Will instruct when and how to resume after procedure. We will communicate with her prescribing physician Dr. Tamala Julian to ensure that holding his Plavix is acceptable. We discussed the risk, benefits and alternatives to colonoscopy/endoscopy.  We also discussed the low but real risk of cardiovascular event such as heart attack, stroke, embolism, thrombosis or ischemia/infarct of other organs off Plavix and explained the need to seek urgent help if this occurs.  She is agreeable and wishes to proceed.  Chronic kidney disease, stage 3b ([place]) Had some hypokalemia last check in Aug, will recheck prior to endoscopic evaluation   Patient Care Team: Caren Macadam, MD as PCP - General (Family Medicine) Belva Crome, MD as PCP - Cardiology (Cardiology) Calvert Cantor, MD (Ophthalmology)  HISTORY OF PRESENT ILLNESS: 77 y.o. female with a past medical history of CAD with angina, type 2  diabetes, hypertension tension, hyperlipidemia, prior meningioma 2008, L the EF 60% 6073 with diastolic dysfunction, PAD s/p atherectomy RSFA 05/2021 on plavix and ASA, history of GERD s/p fundoplication, history of esophageal dilitation, history of colon cancer s/p right colectomy 2006 with chemo, and others listed below presents to discuss a colonoscopy.  09/10/2015 EGD Dr. Carlean Purl: Esophageal stenosis the GE junction, mild, dilated to 20 mm, Nissen fundoplication was found with an intact wrap, otherwise normal exam 07/14/2016 colonoscopy Dr. Carlean Purl: End-to-side ileocolonic anastomosis characterized by healthy-appearing mucosa, otherwise normal exam.   Repeat recommended in 5 years. 07/2021 08/2017 EGD  with dilation to 20 mm of the GE junction status post fundoplication.   Daughter [name] is with the patient, provides some of the history.  She has GERD badly, she is on pepcid as needed.  Patient denies dysphagia to food, but can have her pills get stuck.  Has lower AB pain/cramping, can be fore BM or worse with spicy foods.  She states she is having constipation since last year, will feel the need to have BM but will not be able to go. Hard stools that can "break loose" and become soft.  No melena, no hematochezia.  Can go 3-4 days or even up to a week without a BM.  Dr. Tamala Julian is retiring end of this month, going to be seeing Dr. [name]. Denies chest pain, SOB.  No NSAIDS, no ETOH. Denies smoking.   CBC  04/14/2022  HGB 10.6 MCV 89.0 without evidence of anemia WBC 8.3 Platelets 272 Anemia panel 09/09/2021  Iron 87 Ferritin 41  09/09/2021  B12 327 Kidney function 11/09/2021  BUN 42 Cr 1.61  GFR 33  Potassium 3.3   LFTs 07/29/2021  AST 23 ALT 13 Alkphos 63 TBili 0.5   She  reports that she has never smoked. She has never used smokeless tobacco. She reports that she does not drink alcohol and does not use drugs.  Current Medications:   Current Outpatient Medications (Endocrine & Metabolic):     dapagliflozin propanediol (FARXIGA) 10 MG TABS tablet, Take 10 mg by mouth daily.   glipiZIDE (GLUCOTROL XL) 10 MG 24 hr tablet, Take 10 mg by mouth daily.   glipiZIDE (GLUCOTROL XL) 5 MG 24 hr tablet, Take 5 mg by mouth daily.   glipiZIDE (GLUCOTROL) 5 MG tablet, Take 5 mg by mouth daily before breakfast.   Current Outpatient Medications (Cardiovascular):    amLODipine (NORVASC) 5 MG tablet, TAKE ONE TABLET BY MOUTH EVERY MORNING   losartan-hydrochlorothiazide (HYZAAR) 50-12.5 MG tablet, Take 1 tablet by mouth daily.   nitroGLYCERIN (NITROSTAT) 0.4 MG SL tablet, Place 1 tablet (0.4 mg total) under the tongue every 5 (five) minutes as needed for chest pain.   rosuvastatin (CRESTOR) 5 MG tablet, Take 5 mg by mouth daily.   isosorbide mononitrate (IMDUR) 60 MG 24 hr tablet, TAKE ONE TABLET BY MOUTH ONCE DAILY   rosuvastatin (CRESTOR) 20 MG tablet, TAKE ONE TABLET BY MOUTH EVERY MORNING (Patient not taking: Reported on 05/05/2022)     Current Outpatient Medications (Analgesics):    aspirin 81 MG chewable tablet, Chew 81 mg by mouth daily.   Current Outpatient Medications (Hematological):    clopidogrel (PLAVIX) 75 MG tablet, TAKE ONE TABLET BY MOUTH EVERY MORNING   Current Outpatient Medications (Other):    famotidine (PEPCID) 20 MG tablet, TAKE 1 TABLET(20 MG) BY MOUTH TWICE DAILY AS NEEDED FOR HEARTBURN OR INDIGESTION   meclizine (ANTIVERT) 25 MG tablet, SMARTSIG:1 Tablet(s) By Mouth Every 12 Hours PRN   polyethylene glycol powder (GLYCOLAX/MIRALAX) 17 GM/SCOOP powder, Take 17 g by mouth daily.   prednisoLONE acetate (PRED FORTE) 1 % ophthalmic suspension, Place 1 drop into the left eye.   PRODIGY NO CODING BLOOD GLUC test strip, TESTING ONCE DAILY.   Prodigy Twist Top Lancets 28G MISC, TESTING ONCE DAILY.   timolol (TIMOPTIC) 0.5 % ophthalmic solution, Place 1 drop into both eyes daily.   trimethoprim-polymyxin b (POLYTRIM) ophthalmic solution, SMARTSIG:In Eye(s)  Current  Facility-Administered Medications (Other):    sodium chloride flush (NS) 0.9 % injection 3 mL  Medical History:  Past Medical History:  Diagnosis Date   Allergy    Anemia    Asthma    Blood transfusion without reported diagnosis 12/23/2004   with colon cancer   Brain tumor (benign) ([place])    Colon cancer ([place]) 03/07/2005   COVID-19 04/2019   DEPRESSIVE DISORDER, NOS 06/08/2006   Qualifier: Diagnosis of  By: [name]     Diabetes mellitus    Type 2   Dysphagia 11/17/2014   ENDOSCOPIC IMPRESSION: 1) Single distal esophageal erison - superficial and 5-7 mm. Biopsied 2) S/P fundoplication - GE junction dilated to 20 mm (18-19-20 mm balloon) 3) Otherwise normal except some tortuosity of esophagus RECOMMENDATIONS: 1. Clear liquids until 1100, then soft foods rest of day. Resume prior diet tomorrow. 2. Office will call with results 3. If persistent dysphagia anticipate Ba   Erythropoietin (EPO) stimulating agent anemia management patient 10/01/2020   EXTERNAL HEMORRHOIDS 10/23/2001   Qualifier: Diagnosis of  By: Nolon Rod CMA (AAMA), Robin     Female bladder prolapse    Gastritis    GERD (gastroesophageal  reflux disease)    Glaucoma    Hip pain 10/30/2019   History of colon cancer 06/08/2006   Status post resection (right colectomy) in 2006. Tumor stage was T3 N0 IIA. Received 4 cycles of FOLFOX and 8 cycles of chemotherapy with 5 fluorouracil leucovorin and 5FU from 04/27/2005 through 10/13/2005 Colonoscopy 06/2011 no polyps (suspected polyp was not true polyp) - routine repeat colonoscopy about 06/2016   Hypercholesterolemia    Hypertension    Itching 01/09/2012   For the past 3 weeks she has noticed full body itching.  She has not noticed any specific areas for the itching is worse.  She has tried to apply triamcinolone and petroleum jelly to reduce the itching.  This does not seem to be effective so far.  She denies right upper quadrant pain and denies any history of illicit drug use.   She has not had issues in the past with bedbugs nor does she have any    Malignant neoplasm of colon ([place]) 06/28/2019   MENINGIOMA 02/07/2007   Qualifier: Diagnosis of  By: [name], HEIDI     Pain in both feet 12/14/2017   Patient reports that she had has had some foot pain bilaterally for the past 7 years.  She reports that it seems to have gotten worse in the past 2 months.  This pain is not burning.  She reports good sensation in both feet.  She sometimes has trouble walking due to the pain.  It is described as though she has a wound on her foot.  She has not had recent trauma to her feet and denies open wounds o   Unstable angina ([place]) 01/15/2019   Vertigo    Pt Reported   Allergies:  Allergies  Allergen Reactions   Metformin And Related Diarrhea     Surgical History:  She  has a past surgical history that includes Right colectomy (03/07/2005); Cardiac catheterization (2005); TMJ Arthroplasty; Laparoscopic Nissen fundoplication (1017); repair prolapsedbladder; Abdominal hysterectomy (1976); Colonoscopy w/ biopsies; Upper gastrointestinal endoscopy; bladder tac; Esophageal manometry (N/A, 03/20/2015); Cataract extraction, bilateral; Eye surgery; Esophagogastroduodenoscopy (egd) with propofol (N/A, 08/23/2017); Balloon dilation (N/A, 08/23/2017); LEFT HEART CATH AND CORONARY ANGIOGRAPHY (N/A, 04/17/2018); LEFT HEART CATH AND CORONARY ANGIOGRAPHY (N/A, 01/17/2019); INTRAVASCULAR PRESSURE WIRE/FFR STUDY (N/A, 01/17/2019); CORONARY STENT INTERVENTION (N/A, 01/17/2019); Colonoscopy; ABDOMINAL AORTOGRAM W/LOWER EXTREMITY (Left, 03/23/2020); EMBOLIZATION (Left, 03/23/2020); Interstim Implant placement (08/25/2016); Lower Extremity Angiography (05/31/2021); and PERIPHERAL VASCULAR ATHERECTOMY (05/31/2021). Family History:  Her family history includes Bone cancer in her father; Diabetes in her daughter and father; Heart attack in her daughter; Hypertension in her daughter; Hypotension in her father;  Kidney failure in her father; Lymphoma in an other family member; Suicidality in her son.  REVIEW OF SYSTEMS  : All other systems reviewed and negative except where noted in the History of Present Illness.  PHYSICAL EXAM: BP 118/60   Pulse (!) 57   Ht '5\' 3"'$  (1.6 m)   Wt 134 lb (60.8 kg)   SpO2 98%   BMI 23.74 kg/m  General:   Pleasant, well developed female in no acute distress Head:   Normocephalic and atraumatic. Eyes:  sclerae anicteric,conjunctive pink  Heart:   regular rate and rhythm Pulm:  Clear anteriorly; no wheezing Abdomen:   Soft, Obese AB, Active bowel sounds. No tenderness, well healed vertical lower AB scar, large fibrosis below it, No organomegaly appreciated. Rectal: Not evaluated Extremities:  Without edema. Msk: Symmetrical without gross deformities. Peripheral pulses intact.  Neurologic:  Alert  and  oriented x4;  No focal deficits.  Skin:   Dry and intact without significant lesions or rashes. Psychiatric:  Cooperative. Normal mood and affect.  RELEVANT LABS AND IMAGING: CBC    Component Value Date/Time   WBC 8.3 04/14/2022 1135   RBC 3.74 (L) 04/14/2022 1135   HGB 10.6 (L) 04/14/2022 1135   HGB 9.8 (L) 05/19/2021 1508   HGB 12.1 05/15/2014 1153   HCT 33.3 (L) 04/14/2022 1135   HCT 30.5 (L) 05/19/2021 1508   HCT 36.8 05/15/2014 1153   PLT 272 04/14/2022 1135   PLT 235 05/19/2021 1508   MCV 89.0 04/14/2022 1135   MCV 88 05/19/2021 1508   MCV 86.6 05/15/2014 1153   MCH 28.3 04/14/2022 1135   MCHC 31.8 04/14/2022 1135   RDW 14.2 04/14/2022 1135   RDW 14.5 05/19/2021 1508   RDW 13.4 05/15/2014 1153   LYMPHSABS 1.9 04/14/2022 1135   LYMPHSABS 3.0 05/15/2014 1153   MONOABS 0.4 04/14/2022 1135   MONOABS 0.6 05/15/2014 1153   EOSABS 0.2 04/14/2022 1135   EOSABS 0.1 05/15/2014 1153   BASOSABS 0.1 04/14/2022 1135   BASOSABS 0.0 05/15/2014 1153    CMP     Component Value Date/Time   NA 142 11/09/2021 2017   NA 149 (H) 05/19/2021 1508   NA 143  05/15/2014 1154   K 3.3 (L) 11/09/2021 2017   K 3.6 05/15/2014 1154   CL 113 (H) 11/09/2021 2017   CL 101 05/11/2012 1544   CO2 24 11/09/2021 2017   CO2 24 05/15/2014 1154   GLUCOSE 229 (H) 11/09/2021 2017   GLUCOSE 143 (H) 05/15/2014 1154   GLUCOSE 272 (H) 05/11/2012 1544   BUN 42 (H) 11/09/2021 2017   BUN 37 (H) 05/19/2021 1508   BUN 17.3 05/15/2014 1154   CREATININE 1.61 (H) 11/09/2021 2017   CREATININE 1.48 (H) 10/28/2020 1208   CREATININE 1.06 (H) 07/20/2015 1641   CREATININE 1.0 05/15/2014 1154   CALCIUM 8.8 (L) 11/09/2021 2017   CALCIUM 9.6 05/15/2014 1154   PROT 7.4 07/29/2021 2210   PROT 6.8 12/08/2020 1051   PROT 7.0 05/15/2014 1154   ALBUMIN 3.9 07/29/2021 2210   ALBUMIN 4.2 12/08/2020 1051   ALBUMIN 3.7 05/15/2014 1154   AST 23 07/29/2021 2210   AST 19 10/28/2020 1208   AST 13 05/15/2014 1154   ALT 13 07/29/2021 2210   ALT 15 10/28/2020 1208   ALT 9 05/15/2014 1154   ALKPHOS 63 07/29/2021 2210   ALKPHOS 94 05/15/2014 1154   BILITOT 0.5 07/29/2021 2210   BILITOT 0.6 12/08/2020 1051   BILITOT 0.7 10/28/2020 1208   BILITOT 0.68 05/15/2014 1154   GFRNONAA 33 (L) 11/09/2021 2017   GFRNONAA 37 (L) 10/28/2020 1208   GFRNONAA 54 (L) 07/20/2015 1641   GFRAA 36 (L) 04/09/2020 1024   GFRAA 62 07/20/2015 [place] Dawnna Gritz, PA-C 11:36 AM

## 2022-05-04 ENCOUNTER — Encounter (INDEPENDENT_AMBULATORY_CARE_PROVIDER_SITE_OTHER): Payer: Medicare Other | Admitting: Ophthalmology

## 2022-05-05 ENCOUNTER — Ambulatory Visit (INDEPENDENT_AMBULATORY_CARE_PROVIDER_SITE_OTHER): Payer: 59 | Admitting: Physician Assistant

## 2022-05-05 ENCOUNTER — Other Ambulatory Visit (INDEPENDENT_AMBULATORY_CARE_PROVIDER_SITE_OTHER): Payer: 59

## 2022-05-05 ENCOUNTER — Encounter: Payer: Self-pay | Admitting: Hematology and Oncology

## 2022-05-05 ENCOUNTER — Encounter: Payer: Self-pay | Admitting: Physician Assistant

## 2022-05-05 ENCOUNTER — Encounter: Payer: Medicare Other | Admitting: Internal Medicine

## 2022-05-05 ENCOUNTER — Telehealth: Payer: Self-pay

## 2022-05-05 VITALS — BP 118/60 | HR 57 | Ht 63.0 in | Wt 134.0 lb

## 2022-05-05 DIAGNOSIS — N1832 Chronic kidney disease, stage 3b: Secondary | ICD-10-CM

## 2022-05-05 DIAGNOSIS — Z85038 Personal history of other malignant neoplasm of large intestine: Secondary | ICD-10-CM

## 2022-05-05 DIAGNOSIS — I2510 Atherosclerotic heart disease of native coronary artery without angina pectoris: Secondary | ICD-10-CM | POA: Diagnosis not present

## 2022-05-05 DIAGNOSIS — D509 Iron deficiency anemia, unspecified: Secondary | ICD-10-CM

## 2022-05-05 DIAGNOSIS — K219 Gastro-esophageal reflux disease without esophagitis: Secondary | ICD-10-CM | POA: Diagnosis not present

## 2022-05-05 DIAGNOSIS — R1319 Other dysphagia: Secondary | ICD-10-CM

## 2022-05-05 DIAGNOSIS — Z9861 Coronary angioplasty status: Secondary | ICD-10-CM | POA: Diagnosis not present

## 2022-05-05 LAB — CBC WITH DIFFERENTIAL/PLATELET
Basophils Absolute: 0.1 10*3/uL (ref 0.0–0.1)
Basophils Relative: 1.3 % (ref 0.0–3.0)
Eosinophils Absolute: 0.2 10*3/uL (ref 0.0–0.7)
Eosinophils Relative: 2.4 % (ref 0.0–5.0)
HCT: 36.2 % (ref 36.0–46.0)
Hemoglobin: 11.9 g/dL — ABNORMAL LOW (ref 12.0–15.0)
Lymphocytes Relative: 28.9 % (ref 12.0–46.0)
Lymphs Abs: 2.7 10*3/uL (ref 0.7–4.0)
MCHC: 33 g/dL (ref 30.0–36.0)
MCV: 85.3 fl (ref 78.0–100.0)
Monocytes Absolute: 0.5 10*3/uL (ref 0.1–1.0)
Monocytes Relative: 5.5 % (ref 3.0–12.0)
Neutro Abs: 5.7 10*3/uL (ref 1.4–7.7)
Neutrophils Relative %: 61.9 % (ref 43.0–77.0)
Platelets: 221 10*3/uL (ref 150.0–400.0)
RBC: 4.24 Mil/uL (ref 3.87–5.11)
RDW: 15.2 % (ref 11.5–15.5)
WBC: 9.2 10*3/uL (ref 4.0–10.5)

## 2022-05-05 LAB — IBC + FERRITIN
Ferritin: 21.7 ng/mL (ref 10.0–291.0)
Iron: 84 ug/dL (ref 42–145)
Saturation Ratios: 21.3 % (ref 20.0–50.0)
TIBC: 394.8 ug/dL (ref 250.0–450.0)
Transferrin: 282 mg/dL (ref 212.0–360.0)

## 2022-05-05 LAB — COMPREHENSIVE METABOLIC PANEL
ALT: 10 U/L (ref 0–35)
AST: 15 U/L (ref 0–37)
Albumin: 4.5 g/dL (ref 3.5–5.2)
Alkaline Phosphatase: 69 U/L (ref 39–117)
BUN: 39 mg/dL — ABNORMAL HIGH (ref 6–23)
CO2: 27 mEq/L (ref 19–32)
Calcium: 10.1 mg/dL (ref 8.4–10.5)
Chloride: 104 mEq/L (ref 96–112)
Creatinine, Ser: 1.79 mg/dL — ABNORMAL HIGH (ref 0.40–1.20)
GFR: 27.38 mL/min — ABNORMAL LOW (ref >60.00)
Glucose, Bld: 102 mg/dL — ABNORMAL HIGH (ref 70–99)
Potassium: 3.7 mEq/L (ref 3.5–5.1)
Sodium: 141 mEq/L (ref 135–145)
Total Bilirubin: 0.6 mg/dL (ref 0.2–1.2)
Total Protein: 8 g/dL (ref 6.0–8.3)

## 2022-05-05 NOTE — Telephone Encounter (Signed)
[facility] Pre-operative Risk Assessment     Request for surgical clearance:     Endoscopy Procedure  What type of surgery is being performed?     EGD/Colon  When is this surgery scheduled?     05/17/22  What type of clearance is required ?   Pharmacy  Are there any medications that need to be held prior to surgery and how long? Plavix x 5 days  Practice name and name of physician performing surgery?      [facility]  What is your office phone and fax number?      Phone- [phone]  [phone]  Anesthesia type (None, local, MAC, general) ?       MAC

## 2022-05-05 NOTE — Telephone Encounter (Signed)
   Patient Name: [name]  DOB: May 14, 1945 MRN: [id number]  Primary Cardiologist: Sinclair Grooms, MD  Chart reviewed as part of pre-operative protocol coverage. Pre-op clearance already addressed by colleagues in earlier phone notes. To summarize recommendations:  -Just cleared by myself for dental work earlier this month. She can hold plavix x 5 days prior to her procedure. Please restart when medically safe to do so.   Will route this bundled recommendation to requesting provider via Epic fax function and remove from pre-op pool. Please call with questions.  Elgie Collard, PA-C 05/05/2022, 12:07 PM

## 2022-05-05 NOTE — Telephone Encounter (Signed)
Informed patient he can hold his Plavix 5 days prior to her procedure. Patient verbalized understanding.

## 2022-05-05 NOTE — Patient Instructions (Addendum)
Your provider has requested that you go to the basement level for lab work before leaving today. Press "B" on the elevator. The lab is located at the first door on the left as you exit the elevator.  Take the pepcid daily Avoid spicy and acidic foods Avoid fatty foods Limit your intake of coffee, tea, alcohol, and carbonated drinks Work to maintain a healthy weight Keep the head of the bed elevated at least 3 inches with blocks or a wedge pillow if you are having any nighttime symptoms Stay upright for 2 hours after eating Avoid meals and snacks three to four hours before bedtime  You have been scheduled for an endoscopy and colonoscopy. Please follow the written instructions given to you at your visit today. Please pick up your prep supplies at the pharmacy within the next 1-3 days. If you use inhalers (even only as needed), please bring them with you on the day of your procedure.  Due to recent changes in healthcare laws, you may see the results of your imaging and laboratory studies on MyChart before your provider has had a chance to review them.  We understand that in some cases there may be results that are confusing or concerning to you. Not all laboratory results come back in the same time frame and the provider may be waiting for multiple results in order to interpret others.  Please give [place] 48 hours in order for your provider to thoroughly review all the results before contacting the office for clarification of your results.   The [facility] providers would like to encourage you to use [facility] to communicate with providers for non-urgent requests or questions.  Due to long hold times on the telephone, sending your provider a message by [facility] may be a faster and more efficient way to get a response.  Please allow 48 business hours for a response.  Please remember that this is for non-urgent requests.

## 2022-05-06 ENCOUNTER — Ambulatory Visit: Payer: Medicare Other | Admitting: Physician Assistant

## 2022-05-09 ENCOUNTER — Encounter: Payer: Self-pay | Admitting: Internal Medicine

## 2022-05-11 ENCOUNTER — Encounter (INDEPENDENT_AMBULATORY_CARE_PROVIDER_SITE_OTHER): Payer: 59 | Admitting: Ophthalmology

## 2022-05-11 ENCOUNTER — Encounter (INDEPENDENT_AMBULATORY_CARE_PROVIDER_SITE_OTHER): Payer: Medicare Other | Admitting: Ophthalmology

## 2022-05-11 DIAGNOSIS — I10 Essential (primary) hypertension: Secondary | ICD-10-CM | POA: Diagnosis not present

## 2022-05-11 DIAGNOSIS — H43813 Vitreous degeneration, bilateral: Secondary | ICD-10-CM | POA: Diagnosis not present

## 2022-05-11 DIAGNOSIS — H35033 Hypertensive retinopathy, bilateral: Secondary | ICD-10-CM | POA: Diagnosis not present

## 2022-05-11 DIAGNOSIS — E113591 Type 2 diabetes mellitus with proliferative diabetic retinopathy without macular edema, right eye: Secondary | ICD-10-CM | POA: Diagnosis not present

## 2022-05-11 DIAGNOSIS — E113512 Type 2 diabetes mellitus with proliferative diabetic retinopathy with macular edema, left eye: Secondary | ICD-10-CM | POA: Diagnosis not present

## 2022-05-17 ENCOUNTER — Encounter: Payer: Self-pay | Admitting: Internal Medicine

## 2022-05-17 ENCOUNTER — Ambulatory Visit (AMBULATORY_SURGERY_CENTER): Payer: 59 | Admitting: Internal Medicine

## 2022-05-17 VITALS — BP 129/54 | HR 56 | Temp 95.3°F | Resp 9 | Ht 63.0 in | Wt 134.0 lb

## 2022-05-17 DIAGNOSIS — Z789 Other specified health status: Secondary | ICD-10-CM

## 2022-05-17 DIAGNOSIS — K219 Gastro-esophageal reflux disease without esophagitis: Secondary | ICD-10-CM | POA: Diagnosis not present

## 2022-05-17 DIAGNOSIS — D125 Benign neoplasm of sigmoid colon: Secondary | ICD-10-CM

## 2022-05-17 DIAGNOSIS — R1319 Other dysphagia: Secondary | ICD-10-CM

## 2022-05-17 DIAGNOSIS — Z85038 Personal history of other malignant neoplasm of large intestine: Secondary | ICD-10-CM

## 2022-05-17 DIAGNOSIS — Z08 Encounter for follow-up examination after completed treatment for malignant neoplasm: Secondary | ICD-10-CM

## 2022-05-17 HISTORY — DX: Other specified health status: Z78.9

## 2022-05-17 MED ORDER — SODIUM CHLORIDE 0.9 % IV SOLN: 500.0000 mL | Freq: Once | INTRAVENOUS | Status: DC

## 2022-05-17 NOTE — Progress Notes (Signed)
Vss nad trans to pacu °

## 2022-05-17 NOTE — Progress Notes (Signed)
History and Physical Interval Note:  05/17/2022 3:35 PM  [name]  has presented today for endoscopic procedure(s), with the diagnosis of  Encounter Diagnoses  Name Primary?   History of colon cancer Yes   Esophageal dysphagia    Gastroesophageal reflux disease, unspecified whether esophagitis present   .  The various methods of evaluation and treatment have been discussed with the patient and/or family. After consideration of risks, benefits and other options for treatment, the patient has consented to  the endoscopic procedure(s).   The patient's history has been reviewed, patient examined, no change in status, stable for endoscopic procedure(s).  I have reviewed the patient's chart and labs.  Questions were answered to the patient's satisfaction.     Gatha Mayer, MD, [facility]

## 2022-05-17 NOTE — Op Note (Signed)
[facility] Patient Name: [name] Procedure Date: 05/17/2022 3:26 PM MRN: [id number] Endoscopist: Gatha Mayer , MD, [id number] Age: 77 Referring MD:  Date of Birth: Mar 23, 1946 Gender: Female Account #: [id number] Procedure:                Upper GI endoscopy Indications:              Dysphagia Medicines:                Monitored Anesthesia Care Procedure:                Pre-Anesthesia Assessment:                           - Prior to the procedure, a History and Physical                            was performed, and patient medications and                            allergies were reviewed. The patient's tolerance of                            previous anesthesia was also reviewed. The risks                            and benefits of the procedure and the sedation                            options and risks were discussed with the patient.                            All questions were answered, and informed consent                            was obtained. Prior Anticoagulants: The patient                            last took Plavix (clopidogrel) 5 days prior to the                            procedure. ASA Grade Assessment: III - A patient                            with severe systemic disease. After reviewing the                            risks and benefits, the patient was deemed in                            satisfactory condition to undergo the procedure.                           After obtaining informed consent, the endoscope was  passed under direct vision. Throughout the                            procedure, the patient's blood pressure, pulse, and                            oxygen saturations were monitored continuously. The                            GIF [id number] was introduced through the                            mouth, and advanced to the second part of duodenum.                            The upper GI endoscopy was  accomplished without                            difficulty. The patient tolerated the procedure                            well. Scope In: Scope Out: Findings:                 Evidence of a Nissen fundoplication was found in                            the cardia. The wrap appeared intact. This was                            traversed. A TTS dilator was passed through the                            scope. Dilation with an 18-19-20 mm balloon dilator                            was performed to 20 mm. The dilation site was                            examined and showed no change. Estimated blood                            loss: none.                           The exam was otherwise without abnormality.                           The cardia and gastric fundus were otherwise normal                            on retroflexion. Complications:            No immediate complications. Estimated Blood Loss:     Estimated blood loss: none. Impression:               -  A Nissen fundoplication was found. The wrap                            appears intact. Dilated.                           - The examination was otherwise normal.                           - No specimens collected. Recommendation:           - Patient has a contact number available for                            emergencies. The signs and symptoms of potential                            delayed complications were discussed with the                            patient. Return to normal activities tomorrow.                            Written discharge instructions were provided to the                            patient.                           - Resume previous diet.                           - Continue present medications.                           - See the other procedure note for documentation of                            additional recommendations. Gatha Mayer, MD 05/17/2022 4:31:18 PM This report has been signed electronically.

## 2022-05-17 NOTE — Progress Notes (Signed)
Called to room to assist during endoscopic procedure.  Patient ID and intended procedure confirmed with present staff. Received instructions for my participation in the procedure from the performing physician.  

## 2022-05-17 NOTE — Op Note (Signed)
[place] Patient Name: [name] Procedure Date: 05/17/2022 3:26 PM MRN: [id number] Endoscopist: Gatha Mayer , MD, [id number] Age: 77 Referring MD:  Date of Birth: 08-20-1945 Gender: Female Account #: [id number] Procedure:                Colonoscopy Indications:              High risk colon cancer surveillance: Personal                            history of colon cancer, Last colonoscopy: 2018 Medicines:                Monitored Anesthesia Care Procedure:                Pre-Anesthesia Assessment:                           - Prior to the procedure, a History and Physical                            was performed, and patient medications and                            allergies were reviewed. The patient's tolerance of                            previous anesthesia was also reviewed. The risks                            and benefits of the procedure and the sedation                            options and risks were discussed with the patient.                            All questions were answered, and informed consent                            was obtained. Prior Anticoagulants: The patient                            last took Plavix (clopidogrel) 5 days prior to the                            procedure. ASA Grade Assessment: III - A patient                            with severe systemic disease. After reviewing the                            risks and benefits, the patient was deemed in                            satisfactory condition to undergo the procedure.  After obtaining informed consent, the colonoscope                            was passed under direct vision. Throughout the                            procedure, the patient's blood pressure, pulse, and                            oxygen saturations were monitored continuously. The                            Olympus PCF-H190DL (#[id number]) Colonoscope was                             introduced through the anus and advanced to the the                            ileocolonic anastomosis. The colonoscopy was                            performed without difficulty. The patient tolerated                            the procedure well. The quality of the bowel                            preparation was adequate. The rectum and                            Ileocolonic anastomsis areas were photographed. Scope In: 4:09:35 PM Scope Out: 4:21:10 PM Scope Withdrawal Time: 0 hours 7 minutes 40 seconds  Total Procedure Duration: 0 hours 11 minutes 35 seconds  Findings:                 Hemorrhoids were found on perianal exam.                           A 7 mm polyp was found in the sigmoid colon. The                            polyp was semi-pedunculated. The polyp was removed                            with a cold snare. Resection and retrieval were                            complete. Verification of patient identification                            for the specimen was done. Estimated blood loss was                            minimal. To prevent bleeding after the polypectomy,  two hemostatic clips were successfully placed.                            There was no bleeding at the end of the procedure.                           There was evidence of a prior end-to-side                            ileo-colonic anastomosis in the transverse colon.                            This was patent and was characterized by healthy                            appearing mucosa.                           The exam was otherwise without abnormality on                            direct and retroflexion views. Complications:            No immediate complications. Estimated Blood Loss:     Estimated blood loss was minimal. Impression:               - Hemorrhoids found on perianal exam.                           - One 7 mm polyp in the sigmoid colon, removed with                             a cold snare. Resected and retrieved. Clips were                            placed.                           - Patent end-to-side ileo-colonic anastomosis,                            characterized by healthy appearing mucosa.                           - The examination was otherwise normal on direct                            and retroflexion views. Recommendation:           - Patient has a contact number available for                            emergencies. The signs and symptoms of potential                            delayed complications were discussed with the  patient. Return to normal activities tomorrow.                            Written discharge instructions were provided to the                            patient.                           - Resume previous diet.                           - Continue present medications.                           - Resume Plavix (clopidogrel) at prior dose in 3                            days.                           - Await pathology results.                           - No recommendation at this time regarding repeat                            colonoscopy due to age. Gatha Mayer, MD 05/17/2022 4:37:21 PM This report has been signed electronically.

## 2022-05-17 NOTE — Patient Instructions (Addendum)
Resume previous diet.  Continue present medications.  Resume Plavix at prior dose in 3 days.  No repeat colonoscopy due to age unless concerns arise.  YOU HAD AN ENDOSCOPIC PROCEDURE TODAY AT [facility]:   Refer to the procedure report that was given to you for any specific questions about what was found during the examination.  If the procedure report does not answer your questions, please call your gastroenterologist to clarify.  If you requested that your care partner not be given the details of your procedure findings, then the procedure report has been included in a sealed envelope for you to review at your convenience later.  YOU SHOULD EXPECT: Some feelings of bloating in the abdomen. Passage of more gas than usual.  Walking can help get rid of the air that was put into your GI tract during the procedure and reduce the bloating. If you had a lower endoscopy (such as a colonoscopy or flexible sigmoidoscopy) you may notice spotting of blood in your stool or on the toilet paper. If you underwent a bowel prep for your procedure, you may not have a normal bowel movement for a few days.  Please Note:  You might notice some irritation and congestion in your nose or some drainage.  This is from the oxygen used during your procedure.  There is no need for concern and it should clear up in a day or so.  SYMPTOMS TO REPORT IMMEDIATELY:  Following lower endoscopy (colonoscopy or flexible sigmoidoscopy):  Excessive amounts of blood in the stool  Significant tenderness or worsening of abdominal pains  Swelling of the abdomen that is new, acute  Fever of 100F or higher  Following upper endoscopy (EGD)  Vomiting of blood or coffee ground material  New chest pain or pain under the shoulder blades  Painful or persistently difficult swallowing  New shortness of breath  Fever of 100F or higher  Black, tarry-looking stools  For urgent or emergent issues, a gastroenterologist can  be reached at any hour by calling [phone]. Do not use MyChart messaging for urgent concerns.    DIET:  We do recommend a small meal at first, but then you may proceed to your regular diet.  Drink plenty of fluids but you should avoid alcoholic beverages for 24 hours.  ACTIVITY:  You should plan to take it easy for the rest of today and you should NOT DRIVE or use heavy machinery until tomorrow (because of the sedation medicines used during the test).    FOLLOW UP: Our staff will call the number listed on your records the next business day following your procedure.  We will call around 7:15- 8:00 am to check on you and address any questions or concerns that you may have regarding the information given to you following your procedure. If we do not reach you, we will leave a message.     If any biopsies were taken you will be contacted by phone or by letter within the next 1-3 weeks.  Please call us at [phone] if you have not heard about the biopsies in 3 weeks.    SIGNATURES/CONFIDENTIALITY: You and/or your care partner have signed paperwork which will be entered into your electronic medical record.  These signatures attest to the fact that that the information above on your After Visit Summary has been reviewed and is understood.  Full responsibility of the confidentiality of this discharge information lies with you and/or your care-partner.I stretched the esophagus and  stomach junction again, like before. I hope that helps you swallow better.  There was one polyp in the colon - small and benign. I removed it.  I will have it analyzed, I doubt you will need to come back for a routine repeat colonoscopy.  I appreciate the opportunity to care for you. Gatha Mayer, MD, [facility]

## 2022-05-18 ENCOUNTER — Telehealth: Payer: Self-pay

## 2022-05-18 NOTE — Telephone Encounter (Signed)
  Follow up Call-     05/17/2022    2:50 PM  Call back number  Post procedure Call Back phone  # [phone]  Permission to leave phone message Yes     Patient questions:  Do you have a fever, pain , or abdominal swelling? No. Pain Score  0 *  Have you tolerated food without any problems? Yes.    Have you been able to return to your normal activities? Yes.    Do you have any questions about your discharge instructions: Diet   No. Medications  No. Follow up visit  No.  Do you have questions or concerns about your Care? No.  Actions: * If pain score is 4 or above: No action needed, pain <4.

## 2022-05-19 ENCOUNTER — Inpatient Hospital Stay: Payer: 59 | Attending: Hematology and Oncology

## 2022-05-19 ENCOUNTER — Other Ambulatory Visit: Payer: Self-pay

## 2022-05-19 ENCOUNTER — Inpatient Hospital Stay: Payer: 59

## 2022-05-19 VITALS — BP 146/66 | HR 46 | Temp 98.2°F | Resp 16

## 2022-05-19 DIAGNOSIS — D631 Anemia in chronic kidney disease: Secondary | ICD-10-CM | POA: Insufficient documentation

## 2022-05-19 DIAGNOSIS — N1832 Chronic kidney disease, stage 3b: Secondary | ICD-10-CM | POA: Diagnosis not present

## 2022-05-19 DIAGNOSIS — D539 Nutritional anemia, unspecified: Secondary | ICD-10-CM

## 2022-05-19 LAB — CBC WITH DIFFERENTIAL/PLATELET
Abs Immature Granulocytes: 0.03 10*3/uL (ref 0.00–0.07)
Basophils Absolute: 0 10*3/uL (ref 0.0–0.1)
Basophils Relative: 1 %
Eosinophils Absolute: 0.2 10*3/uL (ref 0.0–0.5)
Eosinophils Relative: 2 %
HCT: 34.3 % — ABNORMAL LOW (ref 36.0–46.0)
Hemoglobin: 11.2 g/dL — ABNORMAL LOW (ref 12.0–15.0)
Immature Granulocytes: 0 %
Lymphocytes Relative: 34 %
Lymphs Abs: 2.7 10*3/uL (ref 0.7–4.0)
MCH: 28.4 pg (ref 26.0–34.0)
MCHC: 32.7 g/dL (ref 30.0–36.0)
MCV: 86.8 fL (ref 80.0–100.0)
Monocytes Absolute: 0.6 10*3/uL (ref 0.1–1.0)
Monocytes Relative: 8 %
Neutro Abs: 4.6 10*3/uL (ref 1.7–7.7)
Neutrophils Relative %: 55 %
Platelets: 242 10*3/uL (ref 150–400)
RBC: 3.95 MIL/uL (ref 3.87–5.11)
RDW: 14.1 % (ref 11.5–15.5)
WBC: 8.2 10*3/uL (ref 4.0–10.5)
nRBC: 0 % (ref 0.0–0.2)

## 2022-05-19 MED ORDER — EPOETIN ALFA-EPBX 40000 UNIT/ML IJ SOLN: 40000.0000 [IU] | Freq: Once | INTRAMUSCULAR | Status: DC

## 2022-05-23 ENCOUNTER — Encounter: Payer: Self-pay | Admitting: Internal Medicine

## 2022-05-25 DIAGNOSIS — E118 Type 2 diabetes mellitus with unspecified complications: Secondary | ICD-10-CM | POA: Diagnosis not present

## 2022-05-25 DIAGNOSIS — I25118 Atherosclerotic heart disease of native coronary artery with other forms of angina pectoris: Secondary | ICD-10-CM | POA: Diagnosis not present

## 2022-05-25 DIAGNOSIS — R269 Unspecified abnormalities of gait and mobility: Secondary | ICD-10-CM | POA: Diagnosis not present

## 2022-05-25 DIAGNOSIS — I739 Peripheral vascular disease, unspecified: Secondary | ICD-10-CM | POA: Diagnosis not present

## 2022-05-25 DIAGNOSIS — H548 Legal blindness, as defined in USA: Secondary | ICD-10-CM | POA: Diagnosis not present

## 2022-05-25 DIAGNOSIS — R54 Age-related physical debility: Secondary | ICD-10-CM | POA: Diagnosis not present

## 2022-05-25 DIAGNOSIS — I10 Essential (primary) hypertension: Secondary | ICD-10-CM | POA: Diagnosis not present

## 2022-05-25 DIAGNOSIS — Z9181 History of falling: Secondary | ICD-10-CM | POA: Diagnosis not present

## 2022-05-25 DIAGNOSIS — E7800 Pure hypercholesterolemia, unspecified: Secondary | ICD-10-CM | POA: Diagnosis not present

## 2022-06-02 ENCOUNTER — Other Ambulatory Visit: Payer: Self-pay

## 2022-06-02 DIAGNOSIS — I10 Essential (primary) hypertension: Secondary | ICD-10-CM

## 2022-06-06 ENCOUNTER — Ambulatory Visit: Admit: 2022-06-06 | Payer: Medicare Other | Admitting: Oral Surgery

## 2022-06-06 ENCOUNTER — Telehealth: Payer: Self-pay | Admitting: *Deleted

## 2022-06-06 SURGERY — DENTAL RESTORATION/EXTRACTIONS
Anesthesia: General

## 2022-06-06 NOTE — Progress Notes (Signed)
  Care Coordination  Outreach Note  06/06/2022 Name: [name]. Mcpherson MRN: [id number] DOB: [birth year]   Care Coordination Outreach Attempts: An unsuccessful telephone outreach was attempted today to offer the patient information about available care coordination services as a benefit of their health plan.   Follow Up Plan:  Additional outreach attempts will be made to offer the patient care coordination information and services.   Encounter Outcome:  No Answer  [name]  Direct Dial: [phone]

## 2022-06-07 NOTE — Progress Notes (Signed)
  Care Coordination  Outreach Note  06/07/2022 Name: [name]. Scardino MRN: [id number] DOB: [birth year]   Care Coordination Outreach Attempts: A second unsuccessful outreach was attempted today to offer the patient with information about available care coordination services as a benefit of their health plan.     Follow Up Plan:  Additional outreach attempts will be made to offer the patient care coordination information and services.   Encounter Outcome:  No Answer  [place]  Direct Dial: [phone]

## 2022-06-08 ENCOUNTER — Encounter (INDEPENDENT_AMBULATORY_CARE_PROVIDER_SITE_OTHER): Payer: 59 | Admitting: Ophthalmology

## 2022-06-08 DIAGNOSIS — I10 Essential (primary) hypertension: Secondary | ICD-10-CM

## 2022-06-08 DIAGNOSIS — E7800 Pure hypercholesterolemia, unspecified: Secondary | ICD-10-CM | POA: Diagnosis not present

## 2022-06-08 DIAGNOSIS — H43813 Vitreous degeneration, bilateral: Secondary | ICD-10-CM

## 2022-06-08 DIAGNOSIS — H35033 Hypertensive retinopathy, bilateral: Secondary | ICD-10-CM | POA: Diagnosis not present

## 2022-06-08 DIAGNOSIS — E113553 Type 2 diabetes mellitus with stable proliferative diabetic retinopathy, bilateral: Secondary | ICD-10-CM | POA: Diagnosis not present

## 2022-06-08 DIAGNOSIS — I25118 Atherosclerotic heart disease of native coronary artery with other forms of angina pectoris: Secondary | ICD-10-CM | POA: Diagnosis not present

## 2022-06-08 DIAGNOSIS — E113591 Type 2 diabetes mellitus with proliferative diabetic retinopathy without macular edema, right eye: Secondary | ICD-10-CM | POA: Diagnosis not present

## 2022-06-08 DIAGNOSIS — E113512 Type 2 diabetes mellitus with proliferative diabetic retinopathy with macular edema, left eye: Secondary | ICD-10-CM | POA: Diagnosis not present

## 2022-06-10 NOTE — Progress Notes (Signed)
  Care Coordination  Outreach Note  06/10/2022 Name: [name]. [name] MRN: [id number] DOB: [birth year]   Care Coordination Outreach Attempts: A third unsuccessful outreach was attempted today to offer the patient with information about available care coordination services as a benefit of their health plan.   Follow Up Plan:  No further outreach attempts will be made at this time. We have been unable to contact the patient to offer or enroll patient in care coordination services  Encounter Outcome:  No Answer  [place]: [phone]

## 2022-06-21 DIAGNOSIS — I25118 Atherosclerotic heart disease of native coronary artery with other forms of angina pectoris: Secondary | ICD-10-CM | POA: Diagnosis not present

## 2022-06-21 DIAGNOSIS — E7800 Pure hypercholesterolemia, unspecified: Secondary | ICD-10-CM | POA: Diagnosis not present

## 2022-06-21 DIAGNOSIS — E113553 Type 2 diabetes mellitus with stable proliferative diabetic retinopathy, bilateral: Secondary | ICD-10-CM | POA: Diagnosis not present

## 2022-06-21 DIAGNOSIS — I10 Essential (primary) hypertension: Secondary | ICD-10-CM | POA: Diagnosis not present

## 2022-06-23 ENCOUNTER — Inpatient Hospital Stay: Payer: 59 | Attending: Hematology and Oncology

## 2022-06-23 ENCOUNTER — Inpatient Hospital Stay: Payer: 59

## 2022-06-23 VITALS — BP 136/55 | HR 57 | Temp 98.2°F | Resp 16

## 2022-06-23 DIAGNOSIS — D539 Nutritional anemia, unspecified: Secondary | ICD-10-CM

## 2022-06-23 DIAGNOSIS — N1832 Chronic kidney disease, stage 3b: Secondary | ICD-10-CM | POA: Diagnosis not present

## 2022-06-23 DIAGNOSIS — D631 Anemia in chronic kidney disease: Secondary | ICD-10-CM | POA: Diagnosis not present

## 2022-06-23 LAB — CBC WITH DIFFERENTIAL/PLATELET
Abs Immature Granulocytes: 0.04 10*3/uL (ref 0.00–0.07)
Basophils Absolute: 0.1 10*3/uL (ref 0.0–0.1)
Basophils Relative: 1 %
Eosinophils Absolute: 0.3 10*3/uL (ref 0.0–0.5)
Eosinophils Relative: 3 %
HCT: 33 % — ABNORMAL LOW (ref 36.0–46.0)
Hemoglobin: 10.5 g/dL — ABNORMAL LOW (ref 12.0–15.0)
Immature Granulocytes: 0 %
Lymphocytes Relative: 24 %
Lymphs Abs: 2.3 10*3/uL (ref 0.7–4.0)
MCH: 28.8 pg (ref 26.0–34.0)
MCHC: 31.8 g/dL (ref 30.0–36.0)
MCV: 90.4 fL (ref 80.0–100.0)
Monocytes Absolute: 0.9 10*3/uL (ref 0.1–1.0)
Monocytes Relative: 10 %
Neutro Abs: 5.9 10*3/uL (ref 1.7–7.7)
Neutrophils Relative %: 62 %
Platelets: 224 10*3/uL (ref 150–400)
RBC: 3.65 MIL/uL — ABNORMAL LOW (ref 3.87–5.11)
RDW: 14.4 % (ref 11.5–15.5)
WBC: 9.5 10*3/uL (ref 4.0–10.5)
nRBC: 0 % (ref 0.0–0.2)

## 2022-06-23 MED ORDER — EPOETIN ALFA-EPBX 40000 UNIT/ML IJ SOLN
40000.0000 [IU] | Freq: Once | INTRAMUSCULAR | Status: AC
  Administered 2022-06-23: 40000 [IU] via SUBCUTANEOUS
  Filled 2022-06-23: qty 1

## 2022-06-23 NOTE — Patient Instructions (Signed)

## 2022-06-27 DIAGNOSIS — R531 Weakness: Secondary | ICD-10-CM | POA: Diagnosis not present

## 2022-06-27 DIAGNOSIS — M79662 Pain in left lower leg: Secondary | ICD-10-CM | POA: Diagnosis not present

## 2022-06-27 DIAGNOSIS — R262 Difficulty in walking, not elsewhere classified: Secondary | ICD-10-CM | POA: Diagnosis not present

## 2022-06-29 DIAGNOSIS — M79662 Pain in left lower leg: Secondary | ICD-10-CM | POA: Diagnosis not present

## 2022-06-29 DIAGNOSIS — R531 Weakness: Secondary | ICD-10-CM | POA: Diagnosis not present

## 2022-06-29 DIAGNOSIS — R262 Difficulty in walking, not elsewhere classified: Secondary | ICD-10-CM | POA: Diagnosis not present

## 2022-07-04 DIAGNOSIS — M79662 Pain in left lower leg: Secondary | ICD-10-CM | POA: Diagnosis not present

## 2022-07-04 DIAGNOSIS — R531 Weakness: Secondary | ICD-10-CM | POA: Diagnosis not present

## 2022-07-04 DIAGNOSIS — R262 Difficulty in walking, not elsewhere classified: Secondary | ICD-10-CM | POA: Diagnosis not present

## 2022-07-04 NOTE — H&P (Signed)
Patient: [name]  PID: [id number]  DOB: [birth year]  SEX: Female   Patient referred by Voncille Lo, DDS for extraction remaining upper teeth, removal palatal torus.  CC: No pain.  Past Medical History:  Asthma, HTN, Unstable angina,  Diabetes, Kidney Trouble, Glaucoma, Meningioma, GERD,     Medications: Amlodipine, Clopidogrel, Famotidine, Farxiga, Glipizide, Isorbide, Losartan, Nitroglycerin, Rosuvastatin    Allergies:     Metformin    Surgeries:   Colon surgery, Hysterectomy, Peripheral Vascular Atherectomy 05/31/2021     Social History       Smoking: n           Alcohol:n Drug use:n                             Exam: BMI 24. Large maxillary palatal torus. Teeth # 2, 4, 11, 13, 15, with recession, decay.  No purulence, edema, fluctuance, trismus. Oral cancer screening negative. Pharynx clear. No lymphadenopathy.  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  Assessment: ASA . Non-restorable               Plan: 1. MD clearance obtained  2. Extraction Teeth # 2, 4, 11, 13, 15.  Removal Palatal torus.  Hospital Day surgery.                 Rx: n               Risks and complications explained. Questions answered.   Gae Bon, DMD

## 2022-07-06 ENCOUNTER — Encounter (HOSPITAL_COMMUNITY): Payer: Self-pay | Admitting: Oral Surgery

## 2022-07-06 ENCOUNTER — Other Ambulatory Visit: Payer: Self-pay

## 2022-07-06 NOTE — Progress Notes (Addendum)
SDW call  PCP - Dr. Caren Macadam  Cardiologist - Dr. Daneen Schick III Oncologist: Dr. Heath Lark   PPM/ICD - No Pacemaker or defibrillator. Patient does have a [employer] bladder control Implant. Patient has lost her remote.  Device Orders -  Rep Notified -    Chest x-ray - 11/09/2021 EKG -  12/21/2021 Stress Test -  ECHO - 01/17/2019 Cardiac Cath - 01/17/2019 Cardiology clearance for surgery received 05/05/2022  Sleep Study/sleep apnea/CPAP: No  Type II Diabetic Fasting Blood sugar range:  110-116 How often check sugars: once a day   Blood Thinner Instructions: Hold Plavix 5 days prior to surgery.  Last dose should be 07/02/2022.  Patient states her last dose was 06/29/2022 Aspirin Instructions: Hold starting today, 07/06/2022 if not already done so.    ERAS Protcol - No, NPO PRE-SURGERY Ensure or G2- No   COVID TEST- n/a     Anesthesia review:  Yes. HTN, DM, unstable angina, brain tumor, colon CA, difficult EV start, on EPO, Cardiology clearance received 05/05/2022    Patient denies shortness of breath, fever, cough and chest pain over the phone call    Your procedure is scheduled on Friday, July 08, 2022  Report to [facility] Main Entrance "A" at  0630  A.M., then check in with the Admitting office.  Call this number if you have problems the morning of surgery:  [phone]   If you have any questions prior to your surgery date call [phone]: Open Monday-Friday 8am-4pm If you experience any cold or flu symptoms such as cough, fever, chills, shortness of breath, etc. between now and your scheduled surgery, please notify us at the above number     Remember:  Do not eat or drink after midnight the night before your surgery   Take these medicines the morning of surgery with A SIP OF WATER:  Amlodipine, isosorbide, pred forte eye drops, crestor, timoptic eye drops  As needed: Pepcid, antivert  As of today, STOP taking any Aspirin (unless otherwise instructed by your  surgeon) Aleve, Naproxen, Ibuprofen, Motrin, Advil, Goody's, BC's, all herbal medications, fish oil, and all vitamins.   WHAT DO I DO ABOUT MY DIABETES MEDICATION?   Do not take oral diabetes medicines (pills) the morning of surgery.     THE MORNING OF SURGERY, Hold morning dose of Glipizide.          Hold Farxiga 72 hours prior to surgery, last dose to be July 04, 2022.  Patient states she stopped this months ago.   The day of surgery, do not take other diabetes injectables, including Byetta (exenatide), Bydureon (exenatide ER), Victoza (liraglutide), or Trulicity (dulaglutide).  If your CBG is greater than 220 mg/dL, you may take  of your sliding scale (correction) dose of insulin.   HOW TO MANAGE YOUR DIABETES BEFORE AND AFTER SURGERY  Why is it important to control my blood sugar before and after surgery? Improving blood sugar levels before and after surgery helps healing and can limit problems. A way of improving blood sugar control is eating a healthy diet by:  Eating less sugar and carbohydrates  Increasing activity/exercise  Talking with your doctor about reaching your blood sugar goals High blood sugars (greater than 180 mg/dL) can raise your risk of infections and slow your recovery, so you will need to focus on controlling your diabetes during the weeks before surgery. Make sure that the doctor who takes care of your diabetes knows about your planned surgery including the date  and location.  How do I manage my blood sugar before surgery? Check your blood sugar at least 4 times a day, starting 2 days before surgery, to make sure that the level is not too high or low.  Check your blood sugar the morning of your surgery when you wake up and every 2 hours until you get to the Short Stay unit.  If your blood sugar is less than 70 mg/dL, you will need to treat for low blood sugar: Do not take insulin. Treat a low blood sugar (less than 70 mg/dL) with  cup of clear juice  (cranberry or apple), 4 glucose tablets, OR glucose gel. Recheck blood sugar in 15 minutes after treatment (to make sure it is greater than 70 mg/dL). If your blood sugar is not greater than 70 mg/dL on recheck, call [phone] for further instructions. Report your blood sugar to the short stay nurse when you get to Short Stay.  If you are admitted to the hospital after surgery: Your blood sugar will be checked by the staff and you will probably be given insulin after surgery (instead of oral diabetes medicines) to make sure you have good blood sugar levels. The goal for blood sugar control after surgery is 80-180 mg/dL.

## 2022-07-07 ENCOUNTER — Encounter (HOSPITAL_COMMUNITY): Payer: Self-pay | Admitting: Oral Surgery

## 2022-07-07 NOTE — Anesthesia Preprocedure Evaluation (Addendum)
Anesthesia Evaluation  Patient identified by MRN, date of birth, ID band Patient awake    Reviewed: Allergy & Precautions, NPO status , Patient's Chart, lab work & pertinent test results  Airway Mallampati: II  TM Distance: >3 FB Neck ROM: Full    Dental  (+) Poor Dentition, Missing,    Pulmonary    breath sounds clear to auscultation       Cardiovascular hypertension, Pt. on medications + CAD and + Cardiac Stents   Rhythm:Regular Rate:Normal     Neuro/Psych    GI/Hepatic   Endo/Other  diabetes, Type 2, Oral Hypoglycemic Agents    Renal/GU Renal InsufficiencyRenal diseaseCKD     Musculoskeletal   Abdominal Normal abdominal exam  (+)   Peds  Hematology   Anesthesia Other Findings   Reproductive/Obstetrics                             Anesthesia Physical Anesthesia Plan  ASA: III  Anesthesia Plan: General   Post-op Pain Management:    Induction: Intravenous  PONV Risk Score and Plan: Propofol infusion  Airway Management Planned: Nasal ETT  Additional Equipment: None  Intra-op Plan:   Post-operative Plan: Extubation in OR  Informed Consent: I have reviewed the patients History and Physical, chart, labs and discussed the procedure including the risks, benefits and alternatives for the proposed anesthesia with the patient or authorized representative who has indicated his/her understanding and acceptance.     Dental advisory given  Plan Discussed with: CRNA  Anesthesia Plan Comments: (PAT note written 07/07/2022 by Myra Gianotti, PA-C.  )        Anesthesia Quick Evaluation

## 2022-07-07 NOTE — Progress Notes (Signed)
Anesthesia Chart Review: [name]  Case: [id number] Date/Time: 07/08/22 0827   Procedure: DENTAL RESTORATION/EXTRACTIONS   Anesthesia type: General   Pre-op diagnosis: nonrestorable   Location: [facility] OR ROOM 04 / [place] [facility]   Surgeons: Diona Browner, DMD       DISCUSSION: Patient is a 77 year old female scheduled for the above procedure.  History includes never smoker, HTN, CAD (angina, s/p DES mid RCA, medical therapy for residual LAD, CX disease 01/17/19), PAD (right SFA stent 05/31/21, left SFA atherectomy/PTA 03/23/20), DM2, CKD, GERD (Nissen fundoplication, intact, dilated 05/17/22), right para falcine meningioma (stable, 9 mm 04/01/22 brain MRI), asthma, anemia, colon cancer (right colectomy 03/07/05), DIFFICULT IV access. She reported history of a Medtronic bladder stimulator, but has lost the remote.   Preoperative cardiology input outlined at 04/19/22 televisit by Nicholes Rough, PA-C: "Pt was last seen in cardiology clinic on 01/25/2022 by Dr. Tamala Julian.  At that time [facility] L. Zens was doing well other than an episode of falling to the floor from a sitting position without passing out.  She is under close observation and does not drive due to this incident.  The patient is now pending procedure as outlined above. Since her last visit, she feels that she is stable from a cardiac standpoint.  No chest pain or shortness of breath.  No palpitations.  No episodes of syncope.  Her daughter is there to help and helps her with taking care of her home and any driving to appointments.  She does have difficulty walking 1-2 blocks due to her knees giving out.  However, she is able to do a flight of stairs slowly and could do some light to moderate household tasks if needed.  For this reason she scored a 4.73 METS on the DASI.  This exceeds the 4 METS minimum requirement.   Hold ASA and plavix 5-7 days and resume when safe after the Dental work. She will need to ask the dentist when she should resume.     No  antibiotics needed for SBE prophylaxis". She reported last Plavix 06/29/22.   Anesthesia team to evaluate on the day of procedure. She had a CBC on 06/23/22 showing overall stable H/H 10.5/33.0. BMET from 05/05/22 showed Cr 1.79 which appears within her range (1.61-1.78 since 06/01/21 in [facility]).    VS: Ht 5\' 3"  (1.6 m)   Wt 61.7 kg   BMI 24.09 kg/m  BP Readings from Last 3 Encounters:  06/23/22 (!) 136/55  05/19/22 (!) 146/66  05/17/22 (!) 129/54   Pulse Readings from Last 3 Encounters:  06/23/22 (!) 57  05/19/22 (!) 46  05/17/22 (!) 56     PROVIDERS: Caren Macadam, MD is PCP  Quay Burow, MD is PV cardiologist Daneen Schick, MD is primary cardiologist (recently retired) Star Age, MD is neurologist Heath Lark, MD is Jannifer Rodney, MD is GI. S/p EGD and colonoscopy on 05/17/22. Healthy appearing ileo-colonic anastomosis. Resection 7 mm sigmoid polyp. - Notes suggest she is followed by nephrology.   LABS: Most recent labs in [facility] include: Lab Results  Component Value Date   WBC 9.5 06/23/2022   HGB 10.5 (L) 06/23/2022   HCT 33.0 (L) 06/23/2022   PLT 224 06/23/2022   GLUCOSE 102 (H) 05/05/2022   ALT 10 05/05/2022   AST 15 05/05/2022   NA 141 05/05/2022   K 3.7 05/05/2022   CL 104 05/05/2022   CREATININE 1.79 (H) 05/05/2022   BUN 39 (H) 05/05/2022   CO2 27 05/05/2022  EEG 04/14/22: Impression: This is a normal EEG recording in the waking and sleeping state. No evidence of interictal epileptiform discharges. Normal EEGs, however, do not rule out epilepsy.    IMAGES: MRI Brain 04/01/22: IMPRESSION: 1. No acute intracranial abnormality or focal lesion to explain gait instability. 2. Slight progression of predominantly periventricular T2 hyperintensities bilaterally, mildly advanced for age. This likely reflects the sequela of chronic microvascular ischemia. 3. Stable 9 mm right para falcine meningioma.   1V PCXR 11/09/21: FINDINGS: Cardiac shadow is within  normal limits. The lungs are well aerated bilaterally. No focal infiltrate or effusion is seen. No bony abnormality is noted. IMPRESSION: No active disease.    EKG: 12/21/21: SB at 54 bpm   CV: Long term monitor 01/29/22 - 02/12/22:   Normal sinus rhythm with average HR 62 bpm   Eleven SVT episodes with longest lasting 11 beats and fastest 184 bpm.   PAC and PVC burden < 1%.   One triggered event did not associate with arrhythmia     Echo 01/17/19: IMPRESSIONS   1. Left ventricular ejection fraction, by visual estimation, is 60 to  65%. The left ventricle has normal function. Normal left ventricular size.  There is moderately increased left ventricular hypertrophy. The left  ventricular hypertrophy involves  basal-septum walls.   2. Global right ventricle has normal systolic function.The right  ventricular size is normal. No increase in right ventricular wall  thickness.   3. Left atrial size was normal.   4. Right atrial size was normal.   5. The mitral valve is normal in structure. No evidence of mitral valve  regurgitation. No evidence of mitral stenosis.   6. The tricuspid valve is normal in structure. Tricuspid valve  regurgitation is mild.   7. The aortic valve is normal in structure. Aortic valve regurgitation  was not visualized by color flow Doppler. Structurally normal aortic  valve, with no evidence of sclerosis or stenosis.   8. The pulmonic valve was normal in structure. Pulmonic valve  regurgitation is not visualized by color flow Doppler.   9. Normal pulmonary artery systolic pressure.  10. The inferior vena cava is normal in size with greater than 50%  respiratory variability, suggesting right atrial pressure of 3 mmHg.  11. Elevated left ventricular end-diastolic pressure.  12. Left ventricular diastolic Doppler parameters are indeterminate  pattern of LV diastolic filling.    Cardiac cath/PCI 01/17/19: onclusions: Multivessel CAD, including 90% apical  LAD stenosis, 50-60% mid/distal LCx lesion (DFR 0.97) and diffuse RCA disease with focal 60-70% mid vessel stenosis (DFR 0.83).  Overall appearance is similar to the prior catheterization from 04/2018. Normal left ventricular filling pressure. Successful DFR-guided PCI to the mid RCA using Synergy 2.5 x 16 mm drug-eluting stent with 0% residual stenosis and TIMI-3 flow.   Recommendations: Continue medical therapy for apical LAD and non-obstructive LCx disease. Aggressive secondary prevention. Continue up to 12 months of dual antiplatelet therapy with aspirin and clopidogrel, as tolerated in the setting of chronic anemia. Obtain echo to reassess LV function.    Past Medical History:  Diagnosis Date   Allergy    Anemia    Asthma    Blood transfusion without reported diagnosis 12/23/2004   with colon cancer   Brain tumor (benign) ([place])    Colon cancer ([place]) 03/07/2005   COVID-19 04/2019   DEPRESSIVE DISORDER, NOS 06/08/2006   Qualifier: Diagnosis of  By: [name]     Diabetes mellitus    Type 2  Difficult intravenous access 05/17/2022   Dysphagia 11/17/2014   ENDOSCOPIC IMPRESSION: 1) Single distal esophageal erison - superficial and 5-7 mm. Biopsied 2) S/P fundoplication - GE junction dilated to 20 mm (18-19-20 mm balloon) 3) Otherwise normal except some tortuosity of esophagus RECOMMENDATIONS: 1. Clear liquids until 1100, then soft foods rest of day. Resume prior diet tomorrow. 2. Office will call with results 3. If persistent dysphagia anticipate Ba   Erythropoietin (EPO) stimulating agent anemia management patient 10/01/2020   EXTERNAL HEMORRHOIDS 10/23/2001   Qualifier: Diagnosis of  By: Nolon Rod CMA (AAMA), Robin     Female bladder prolapse    Gastritis    GERD (gastroesophageal reflux disease)    Glaucoma    Hip pain 10/30/2019   History of colon cancer 06/08/2006   Status post resection (right colectomy) in 2006. Tumor stage was T3 N0 IIA. Received 4 cycles of FOLFOX and  8 cycles of chemotherapy with 5 fluorouracil leucovorin and 5FU from 04/27/2005 through 10/13/2005 Colonoscopy 06/2011 no polyps (suspected polyp was not true polyp) - routine repeat colonoscopy about 06/2016   Hypercholesterolemia    Hypertension    Itching 01/09/2012   For the past 3 weeks she has noticed full body itching.  She has not noticed any specific areas for the itching is worse.  She has tried to apply triamcinolone and petroleum jelly to reduce the itching.  This does not seem to be effective so far.  She denies right upper quadrant pain and denies any history of illicit drug use.  She has not had issues in the past with bedbugs nor does she have any    Malignant neoplasm of colon ([place]) 06/28/2019   MENINGIOMA 02/07/2007   Qualifier: Diagnosis of  By: [name], HEIDI     Pain in both feet 12/14/2017   Patient reports that she had has had some foot pain bilaterally for the past 7 years.  She reports that it seems to have gotten worse in the past 2 months.  This pain is not burning.  She reports good sensation in both feet.  She sometimes has trouble walking due to the pain.  It is described as though she has a wound on her foot.  She has not had recent trauma to her feet and denies open wounds o   Unstable angina ([place]) 01/15/2019   Vertigo    Pt Reported    Past Surgical History:  Procedure Laterality Date   ABDOMINAL AORTOGRAM W/LOWER EXTREMITY Left 03/23/2020   Procedure: ABDOMINAL AORTOGRAM W/LOWER EXTREMITY;  Surgeon: Lorretta Harp, MD;  Location: [place] [facility];  Service: Cardiovascular;  Laterality: Left;   ABDOMINAL HYSTERECTOMY  1976   BALLOON DILATION N/A 08/23/2017   Procedure: BALLOON DILATION;  Surgeon: Milus Banister, MD;  Location: [facility];  Service: Endoscopy;  Laterality: N/A;  Esophageal Diltation   bladder tac     CARDIAC CATHETERIZATION  2005   CATARACT EXTRACTION, BILATERAL     COLONOSCOPY     COLONOSCOPY W/ BIOPSIES     CORONARY STENT  INTERVENTION N/A 01/17/2019   Procedure: CORONARY STENT INTERVENTION;  Surgeon: Nelva Bush, MD;  Location: [place] [facility];  Service: Cardiovascular;  Laterality: N/A;   EMBOLIZATION Left 03/23/2020   Procedure: EMBOLIZATION;  Surgeon: Lorretta Harp, MD;  Location: [place] [facility];  Service: Cardiovascular;  Laterality: Left;  SFA AND DCB   ESOPHAGEAL MANOMETRY N/A 03/20/2015   Procedure: ESOPHAGEAL MANOMETRY (EM);  Surgeon: Gatha Mayer, MD;  Location: [facility];  Service: Endoscopy;  Laterality: N/A;   ESOPHAGOGASTRODUODENOSCOPY (EGD) WITH PROPOFOL N/A 08/23/2017   Procedure: ESOPHAGOGASTRODUODENOSCOPY (EGD) WITH PROPOFOL;  Surgeon: Milus Banister, MD;  Location: [facility];  Service: Endoscopy;  Laterality: N/A;   EYE SURGERY     mole removed left eye   INTERSTIM IMPLANT PLACEMENT  08/25/2016   Medtronic Bladder control device: Operator: Bjorn Loser, MD ([place])   INTRAVASCULAR PRESSURE WIRE/FFR STUDY N/A 01/17/2019   Procedure: INTRAVASCULAR PRESSURE WIRE/FFR STUDY;  Surgeon: Nelva Bush, MD;  Location: [place] [facility];  Service: Cardiovascular;  Laterality: N/A;   LAPAROSCOPIC NISSEN FUNDOPLICATION  0000000   LEFT HEART CATH AND CORONARY ANGIOGRAPHY N/A 04/17/2018   Procedure: LEFT HEART CATH AND CORONARY ANGIOGRAPHY;  Surgeon: Belva Crome, MD;  Location: [place] [facility];  Service: Cardiovascular;  Laterality: N/A;   LEFT HEART CATH AND CORONARY ANGIOGRAPHY N/A 01/17/2019   Procedure: LEFT HEART CATH AND CORONARY ANGIOGRAPHY;  Surgeon: Nelva Bush, MD;  Location: [place] [facility];  Service: Cardiovascular;  Laterality: N/A;   LOWER EXTREMITY ANGIOGRAPHY  05/31/2021   Procedure: Lower Extremity Angiography;  Surgeon: Lorretta Harp, MD;  Location: [place] [facility];  Service: Cardiovascular;;   PERIPHERAL VASCULAR ATHERECTOMY  05/31/2021   Procedure: PERIPHERAL VASCULAR ATHERECTOMY;  Surgeon: Lorretta Harp, MD;  Location: [place] [facility]  [facility];  Service: Cardiovascular;;  RT SFA   repair prolapsedbladder     RIGHT COLECTOMY  03/07/2005   TMJ ARTHROPLASTY     UPPER GASTROINTESTINAL ENDOSCOPY      MEDICATIONS:  sodium chloride flush (NS) 0.9 % injection 3 mL    amLODipine (NORVASC) 5 MG tablet   aspirin 81 MG chewable tablet   clopidogrel (PLAVIX) 75 MG tablet   famotidine (PEPCID) 20 MG tablet   glipiZIDE (GLUCOTROL XL) 10 MG 24 hr tablet   GLYXAMBI 25-5 MG TABS   isosorbide mononitrate (IMDUR) 60 MG 24 hr tablet   losartan-hydrochlorothiazide (HYZAAR) 50-12.5 MG tablet   meclizine (ANTIVERT) 25 MG tablet   nitroGLYCERIN (NITROSTAT) 0.4 MG SL tablet   polyethylene glycol powder (GLYCOLAX/MIRALAX) 17 GM/SCOOP powder   prednisoLONE acetate (PRED FORTE) 1 % ophthalmic suspension   rosuvastatin (CRESTOR) 20 MG tablet   PRODIGY NO CODING BLOOD GLUC test strip   Prodigy Twist Top Lancets 28G MISC    Myra Gianotti, PA-C Surgical Short Stay/Anesthesiology [facility] Phone [phone] [facility] Phone [phone] 07/07/2022 12:57 PM

## 2022-07-08 ENCOUNTER — Ambulatory Visit (HOSPITAL_COMMUNITY)
Admission: RE | Admit: 2022-07-08 | Discharge: 2022-07-08 | Disposition: A | Discharge status: Home / Self Care | Payer: 59 | Attending: Oral Surgery | Admitting: Oral Surgery

## 2022-07-08 ENCOUNTER — Ambulatory Visit (HOSPITAL_BASED_OUTPATIENT_CLINIC_OR_DEPARTMENT_OTHER): Payer: 59 | Admitting: Vascular Surgery

## 2022-07-08 ENCOUNTER — Other Ambulatory Visit: Payer: Self-pay

## 2022-07-08 ENCOUNTER — Ambulatory Visit (HOSPITAL_COMMUNITY): Payer: 59 | Admitting: Vascular Surgery

## 2022-07-08 ENCOUNTER — Encounter (HOSPITAL_COMMUNITY): Payer: Self-pay | Admitting: Oral Surgery

## 2022-07-08 ENCOUNTER — Encounter (HOSPITAL_COMMUNITY)
Admission: RE | Disposition: A | Discharge status: Home / Self Care | Payer: Self-pay | Source: Home / Self Care | Attending: Oral Surgery

## 2022-07-08 DIAGNOSIS — I129 Hypertensive chronic kidney disease with stage 1 through stage 4 chronic kidney disease, or unspecified chronic kidney disease: Secondary | ICD-10-CM | POA: Diagnosis not present

## 2022-07-08 DIAGNOSIS — M270 Developmental disorders of jaws: Secondary | ICD-10-CM | POA: Insufficient documentation

## 2022-07-08 DIAGNOSIS — I10 Essential (primary) hypertension: Secondary | ICD-10-CM

## 2022-07-08 DIAGNOSIS — J45909 Unspecified asthma, uncomplicated: Secondary | ICD-10-CM | POA: Insufficient documentation

## 2022-07-08 DIAGNOSIS — E7800 Pure hypercholesterolemia, unspecified: Secondary | ICD-10-CM | POA: Insufficient documentation

## 2022-07-08 DIAGNOSIS — I1310 Hypertensive heart and chronic kidney disease without heart failure, with stage 1 through stage 4 chronic kidney disease, or unspecified chronic kidney disease: Secondary | ICD-10-CM | POA: Diagnosis not present

## 2022-07-08 DIAGNOSIS — Z79899 Other long term (current) drug therapy: Secondary | ICD-10-CM | POA: Insufficient documentation

## 2022-07-08 DIAGNOSIS — K0850 Unsatisfactory restoration of tooth, unspecified: Secondary | ICD-10-CM

## 2022-07-08 DIAGNOSIS — E119 Type 2 diabetes mellitus without complications: Secondary | ICD-10-CM | POA: Insufficient documentation

## 2022-07-08 DIAGNOSIS — N189 Chronic kidney disease, unspecified: Secondary | ICD-10-CM | POA: Diagnosis not present

## 2022-07-08 DIAGNOSIS — E1122 Type 2 diabetes mellitus with diabetic chronic kidney disease: Secondary | ICD-10-CM | POA: Insufficient documentation

## 2022-07-08 DIAGNOSIS — I25110 Atherosclerotic heart disease of native coronary artery with unstable angina pectoris: Secondary | ICD-10-CM | POA: Diagnosis not present

## 2022-07-08 DIAGNOSIS — Z955 Presence of coronary angioplasty implant and graft: Secondary | ICD-10-CM

## 2022-07-08 DIAGNOSIS — I082 Rheumatic disorders of both aortic and tricuspid valves: Secondary | ICD-10-CM | POA: Diagnosis not present

## 2022-07-08 DIAGNOSIS — I2510 Atherosclerotic heart disease of native coronary artery without angina pectoris: Secondary | ICD-10-CM

## 2022-07-08 DIAGNOSIS — C189 Malignant neoplasm of colon, unspecified: Secondary | ICD-10-CM | POA: Insufficient documentation

## 2022-07-08 DIAGNOSIS — K0889 Other specified disorders of teeth and supporting structures: Secondary | ICD-10-CM | POA: Diagnosis not present

## 2022-07-08 DIAGNOSIS — Z7984 Long term (current) use of oral hypoglycemic drugs: Secondary | ICD-10-CM | POA: Diagnosis not present

## 2022-07-08 DIAGNOSIS — K056 Periodontal disease, unspecified: Secondary | ICD-10-CM

## 2022-07-08 DIAGNOSIS — K219 Gastro-esophageal reflux disease without esophagitis: Secondary | ICD-10-CM | POA: Insufficient documentation

## 2022-07-08 DIAGNOSIS — K029 Dental caries, unspecified: Secondary | ICD-10-CM

## 2022-07-08 HISTORY — PX: TOOTH EXTRACTION: SHX859

## 2022-07-08 HISTORY — DX: Chronic kidney disease, unspecified: N18.9

## 2022-07-08 LAB — BASIC METABOLIC PANEL
Anion gap: 11 (ref 5–15)
BUN: 40 mg/dL — ABNORMAL HIGH (ref 8–23)
CO2: 23 mmol/L (ref 22–32)
Calcium: 9.6 mg/dL (ref 8.9–10.3)
Chloride: 107 mmol/L (ref 98–111)
Creatinine, Ser: 2.18 mg/dL — ABNORMAL HIGH (ref 0.44–1.00)
GFR, Estimated: 23 mL/min — ABNORMAL LOW (ref >60)
Glucose, Bld: 167 mg/dL — ABNORMAL HIGH (ref 70–99)
Potassium: 3.3 mmol/L — ABNORMAL LOW (ref 3.5–5.1)
Sodium: 141 mmol/L (ref 135–145)

## 2022-07-08 LAB — GLUCOSE, CAPILLARY
Glucose-Capillary: 135 mg/dL — ABNORMAL HIGH (ref 70–99)
Glucose-Capillary: 129 mg/dL — ABNORMAL HIGH (ref 70–99)
Glucose-Capillary: 212 mg/dL — ABNORMAL HIGH (ref 70–99)

## 2022-07-08 SURGERY — DENTAL RESTORATION/EXTRACTIONS
Anesthesia: General | Site: Mouth

## 2022-07-08 MED ORDER — DEXAMETHASONE SODIUM PHOSPHATE 10 MG/ML IJ SOLN
INTRAMUSCULAR | Status: AC
  Filled 2022-07-08: qty 1

## 2022-07-08 MED ORDER — DEXAMETHASONE SODIUM PHOSPHATE 10 MG/ML IJ SOLN
INTRAMUSCULAR | Status: DC | PRN
  Administered 2022-07-08: 4 mg via INTRAVENOUS

## 2022-07-08 MED ORDER — LIDOCAINE-EPINEPHRINE 2 %-1:100000 IJ SOLN
INTRAMUSCULAR | Status: AC
  Filled 2022-07-08: qty 1

## 2022-07-08 MED ORDER — ONDANSETRON HCL 4 MG/2ML IJ SOLN
INTRAMUSCULAR | Status: AC
  Filled 2022-07-08: qty 2

## 2022-07-08 MED ORDER — SUGAMMADEX SODIUM 200 MG/2ML IV SOLN
INTRAVENOUS | Status: DC | PRN
  Administered 2022-07-08: 60 mg via INTRAVENOUS
  Administered 2022-07-08: 120 mg via INTRAVENOUS

## 2022-07-08 MED ORDER — PHENYLEPHRINE 80 MCG/ML (10ML) SYRINGE FOR IV PUSH (FOR BLOOD PRESSURE SUPPORT)
PREFILLED_SYRINGE | INTRAVENOUS | Status: AC
  Filled 2022-07-08: qty 10

## 2022-07-08 MED ORDER — PROPOFOL 10 MG/ML IV BOLUS
INTRAVENOUS | Status: DC | PRN
  Administered 2022-07-08: 150 mg via INTRAVENOUS

## 2022-07-08 MED ORDER — PROPOFOL 10 MG/ML IV BOLUS
INTRAVENOUS | Status: AC
  Filled 2022-07-08: qty 20

## 2022-07-08 MED ORDER — SODIUM CHLORIDE 0.9 % IR SOLN
Status: DC | PRN
  Administered 2022-07-08: 500 mL

## 2022-07-08 MED ORDER — FENTANYL CITRATE (PF) 250 MCG/5ML IJ SOLN
INTRAMUSCULAR | Status: DC | PRN
  Administered 2022-07-08: 100 ug via INTRAVENOUS

## 2022-07-08 MED ORDER — ACETAMINOPHEN 325 MG PO TABS: 325.0000 mg | ORAL_TABLET | ORAL | Status: DC | PRN

## 2022-07-08 MED ORDER — ROCURONIUM BROMIDE 10 MG/ML (PF) SYRINGE
PREFILLED_SYRINGE | INTRAVENOUS | Status: DC | PRN
  Administered 2022-07-08: 50 mg via INTRAVENOUS

## 2022-07-08 MED ORDER — FENTANYL CITRATE (PF) 250 MCG/5ML IJ SOLN
INTRAMUSCULAR | Status: AC
  Filled 2022-07-08: qty 5

## 2022-07-08 MED ORDER — OXYMETAZOLINE HCL 0.05 % NA SOLN
NASAL | Status: AC
  Filled 2022-07-08: qty 30

## 2022-07-08 MED ORDER — CEFAZOLIN SODIUM-DEXTROSE 2-4 GM/100ML-% IV SOLN
2.0000 g | INTRAVENOUS | Status: AC
  Administered 2022-07-08: 2 g via INTRAVENOUS

## 2022-07-08 MED ORDER — ACETAMINOPHEN 160 MG/5ML PO SOLN: 325.0000 mg | ORAL | Status: DC | PRN

## 2022-07-08 MED ORDER — PROPOFOL 1000 MG/100ML IV EMUL
INTRAVENOUS | Status: AC
  Filled 2022-07-08: qty 100

## 2022-07-08 MED ORDER — FENTANYL CITRATE (PF) 100 MCG/2ML IJ SOLN: 25.0000 ug | INTRAMUSCULAR | Status: DC | PRN

## 2022-07-08 MED ORDER — LIDOCAINE-EPINEPHRINE 2 %-1:100000 IJ SOLN
INTRAMUSCULAR | Status: DC | PRN
  Administered 2022-07-08: 20 mL via INTRADERMAL

## 2022-07-08 MED ORDER — LIDOCAINE 2% (20 MG/ML) 5 ML SYRINGE
INTRAMUSCULAR | Status: DC | PRN
  Administered 2022-07-08: 60 mg via INTRAVENOUS

## 2022-07-08 MED ORDER — INSULIN ASPART 100 UNIT/ML IJ SOLN: 0.0000 [IU] | INTRAMUSCULAR | Status: DC | PRN

## 2022-07-08 MED ORDER — ONDANSETRON HCL 4 MG/2ML IJ SOLN
INTRAMUSCULAR | Status: DC | PRN
  Administered 2022-07-08: 4 mg via INTRAVENOUS

## 2022-07-08 MED ORDER — HYDROCODONE-ACETAMINOPHEN 5-325 MG PO TABS: 1.0000 | ORAL_TABLET | Freq: Four times a day (QID) | ORAL | 0 refills | Status: DC | PRN

## 2022-07-08 MED ORDER — ORAL CARE MOUTH RINSE: 15.0000 mL | Freq: Once | OROMUCOSAL | Status: AC

## 2022-07-08 MED ORDER — OXYCODONE HCL 5 MG PO TABS: 5.0000 mg | ORAL_TABLET | Freq: Once | ORAL | Status: DC | PRN

## 2022-07-08 MED ORDER — OXYCODONE HCL 5 MG/5ML PO SOLN: 5.0000 mg | Freq: Once | ORAL | Status: DC | PRN

## 2022-07-08 MED ORDER — ACETAMINOPHEN 10 MG/ML IV SOLN: 1000.0000 mg | Freq: Once | INTRAVENOUS | Status: DC | PRN

## 2022-07-08 MED ORDER — LIDOCAINE 2% (20 MG/ML) 5 ML SYRINGE
INTRAMUSCULAR | Status: AC
  Filled 2022-07-08: qty 5

## 2022-07-08 MED ORDER — PHENYLEPHRINE HCL (PRESSORS) 10 MG/ML IV SOLN
INTRAVENOUS | Status: DC | PRN
  Administered 2022-07-08 (×2): 80 ug via INTRAVENOUS

## 2022-07-08 MED ORDER — ROCURONIUM BROMIDE 10 MG/ML (PF) SYRINGE
PREFILLED_SYRINGE | INTRAVENOUS | Status: AC
  Filled 2022-07-08: qty 10

## 2022-07-08 MED ORDER — CEFAZOLIN SODIUM-DEXTROSE 2-4 GM/100ML-% IV SOLN
INTRAVENOUS | Status: AC
  Filled 2022-07-08: qty 100

## 2022-07-08 MED ORDER — ONDANSETRON HCL 4 MG/2ML IJ SOLN: 4.0000 mg | Freq: Once | INTRAMUSCULAR | Status: DC | PRN

## 2022-07-08 MED ORDER — 0.9 % SODIUM CHLORIDE (POUR BTL) OPTIME
TOPICAL | Status: DC | PRN
  Administered 2022-07-08: 1000 mL

## 2022-07-08 MED ORDER — CHLORHEXIDINE GLUCONATE 0.12 % MT SOLN: 15.0000 mL | Freq: Once | OROMUCOSAL | Status: AC

## 2022-07-08 MED ORDER — OXYMETAZOLINE HCL 0.05 % NA SOLN
NASAL | Status: DC | PRN
  Administered 2022-07-08 (×2): 2 via NASAL

## 2022-07-08 MED ORDER — LACTATED RINGERS IV SOLN: INTRAVENOUS | Status: DC

## 2022-07-08 MED ORDER — PROPOFOL 500 MG/50ML IV EMUL
INTRAVENOUS | Status: DC | PRN
  Administered 2022-07-08: 25 ug/kg/min via INTRAVENOUS

## 2022-07-08 MED ORDER — CHLORHEXIDINE GLUCONATE 0.12 % MT SOLN
OROMUCOSAL | Status: AC
  Administered 2022-07-08: 15 mL via OROMUCOSAL
  Filled 2022-07-08: qty 15

## 2022-07-08 SURGICAL SUPPLY — 37 items
BAG COUNTER SPONGE SURGICOUNT (BAG) IMPLANT
BAG SPNG CNTER NS LX DISP (BAG)
BLADE SURG 15 STRL LF DISP TIS (BLADE) ×1 IMPLANT
BLADE SURG 15 STRL SS (BLADE) ×1
BUR CROSS CUT FISSURE 1.6 (BURR) ×1 IMPLANT
BUR EGG ELITE 4.0 (BURR) ×1 IMPLANT
CANISTER SUCT 3000ML PPV (MISCELLANEOUS) ×1 IMPLANT
COVER SURGICAL LIGHT HANDLE (MISCELLANEOUS) ×1 IMPLANT
GAUZE PACKING FOLDED 2  STR (GAUZE/BANDAGES/DRESSINGS) ×1
GAUZE PACKING FOLDED 2 STR (GAUZE/BANDAGES/DRESSINGS) ×1 IMPLANT
GLOVE BIO SURGEON STRL SZ 6.5 (GLOVE) IMPLANT
GLOVE BIO SURGEON STRL SZ7 (GLOVE) IMPLANT
GLOVE BIO SURGEON STRL SZ8 (GLOVE) ×1 IMPLANT
GLOVE BIOGEL PI IND STRL 6.5 (GLOVE) IMPLANT
GLOVE BIOGEL PI IND STRL 7.0 (GLOVE) IMPLANT
GOWN STRL REUS W/ TWL LRG LVL3 (GOWN DISPOSABLE) ×1 IMPLANT
GOWN STRL REUS W/ TWL XL LVL3 (GOWN DISPOSABLE) ×1 IMPLANT
GOWN STRL REUS W/TWL LRG LVL3 (GOWN DISPOSABLE) ×1
GOWN STRL REUS W/TWL XL LVL3 (GOWN DISPOSABLE) ×1
IV NS 1000ML (IV SOLUTION) ×1
IV NS 1000ML BAXH (IV SOLUTION) ×1 IMPLANT
KIT BASIN OR (CUSTOM PROCEDURE TRAY) ×1 IMPLANT
KIT TURNOVER KIT B (KITS) ×1 IMPLANT
NDL HYPO 25GX1X1/2 BEV (NEEDLE) ×2 IMPLANT
NEEDLE HYPO 25GX1X1/2 BEV (NEEDLE) ×1 IMPLANT
NS IRRIG 1000ML POUR BTL (IV SOLUTION) ×1 IMPLANT
PAD ARMBOARD 7.5X6 YLW CONV (MISCELLANEOUS) ×1 IMPLANT
SLEEVE IRRIGATION ELITE 7 (MISCELLANEOUS) ×1 IMPLANT
SPIKE FLUID TRANSFER (MISCELLANEOUS) ×1 IMPLANT
SPONGE SURGIFOAM ABS GEL 12-7 (HEMOSTASIS) IMPLANT
SUT CHROMIC 3 0 PS 2 (SUTURE) ×1 IMPLANT
SUT CHROMIC 4 0 PS 5 (SUTURE) IMPLANT
SYR BULB IRRIG 60ML STRL (SYRINGE) ×1 IMPLANT
SYR CONTROL 10ML LL (SYRINGE) ×1 IMPLANT
TRAY ENT MC OR (CUSTOM PROCEDURE TRAY) ×1 IMPLANT
TUBING IRRIGATION (MISCELLANEOUS) ×1 IMPLANT
YANKAUER SUCT BULB TIP NO VENT (SUCTIONS) ×1 IMPLANT

## 2022-07-08 NOTE — Op Note (Signed)
07/08/2022  10:18 AM  PATIENT:  Breawna L. Hoskinson  77 y.o. female  PRE-OPERATIVE DIAGNOSIS:  nonrestorable TEETH NUMBER TWO, FOUR, ELEVEN, THIRTEEN, FIFTEEN, secondary to dental caries and periodontal disease. Maxillary  PALATAL TORUS POST-OPERATIVE DIAGNOSIS:  SAME  PROCEDURE:  Procedure(s): EXTRACTION TEETH NUMBER TWO, FOUR, ELEVEN, THIRTEEN, FIFTEEN, AND REMOVAL PALATAL TORUS  SURGEON:  Surgeon(s): Diona Browner, DMD  ANESTHESIA:   local and general  EBL:  minimal  DRAINS: none   SPECIMEN:  No Specimen  COUNTS:  YES  PLAN OF CARE: Discharge to home after PACU  PATIENT DISPOSITION:  PACU - hemodynamically stable.   PROCEDURE DETAILS: Dictation [id number]  Gae Bon, DMD 07/08/2022 10:18 AM

## 2022-07-08 NOTE — Transfer of Care (Signed)
Immediate Anesthesia Transfer of Care Note  Patient: [name]  Procedure(s) Performed: EXTRACTION TEETH NUMBER TWO, FOUR, ELEVEN, THIRTEEN, FIFTEEN, AVEOLOPLASTY, AND REMOVAL PALATAL TORUS (Mouth)  Patient Location: PACU  Anesthesia Type:General  Level of Consciousness: awake and sedated  Airway & Oxygen Therapy: Patient Spontanous Breathing and Patient connected to nasal cannula oxygen  Post-op Assessment: Report given to RN and Post -op Vital signs reviewed and stable  Post vital signs: Reviewed and stable  Last Vitals:  Vitals Value Taken Time  BP 123/44 07/08/22 1030  Temp 36.4 C 07/08/22 1030  Pulse 54 07/08/22 1032  Resp 21 07/08/22 1032  SpO2 100 % 07/08/22 1032  Vitals shown include unvalidated device data.  Last Pain:  Vitals:   07/08/22 1030  TempSrc:   PainSc: Asleep         Complications: No notable events documented.

## 2022-07-08 NOTE — Anesthesia Postprocedure Evaluation (Signed)
Anesthesia Post Note  Patient: [name]  Procedure(s) Performed: EXTRACTION TEETH NUMBER TWO, FOUR, ELEVEN, THIRTEEN, FIFTEEN, AVEOLOPLASTY, AND REMOVAL PALATAL TORUS (Mouth)     Patient location during evaluation: PACU Anesthesia Type: General Level of consciousness: awake Pain management: pain level controlled Vital Signs Assessment: post-procedure vital signs reviewed and stable Respiratory status: spontaneous breathing Cardiovascular status: stable Postop Assessment: no apparent nausea or vomiting Anesthetic complications: no  No notable events documented.  Last Vitals:  Vitals:   07/08/22 1045 07/08/22 1100  BP: (!) 117/47 (!) 114/50  Pulse: (!) 58 60  Resp: 20 18  Temp:  36.4 C  SpO2: 100% 97%    Last Pain:  Vitals:   07/08/22 1100  TempSrc:   PainSc: 0-No pain                 John F Salome Arnt

## 2022-07-08 NOTE — H&P (Signed)
H&P documentation  -History and Physical Reviewed  -Patient has been re-examined  -No change in the plan of care  [name]  

## 2022-07-08 NOTE — Op Note (Signed)
[name], PERISHO MEDICAL RECORD NO: [id number] ACCOUNT NO: [id number] DATE OF BIRTH: 01-20-1946 FACILITY: [facility] LOCATION: [facility] PHYSICIAN: Gae Bon, DDS  Operative Report   DATE OF PROCEDURE: 07/08/2022  PREOPERATIVE DIAGNOSIS:  Nonrestorable teeth 2, 4, 11, 13, 15 secondary to dental caries and periodontal disease, maxillary palatal torus.  POSTOPERATIVE DIAGNOSIS:  Nonrestorable teeth 2, 4, 11, 13, 15 secondary to dental caries and periodontal disease, maxillary palatal torus.  PROCEDURE:  Extraction teeth numbers 2, 4, 11, 13, 15 and removal of palatal torus.  SURGEON:  Gae Bon, DDS  ANESTHESIA:  General, nasal intubation, Dr. Jillyn Hidden attending.  DESCRIPTION OF PROCEDURE:  The patient was taken to the operating room and placed on the table in supine position.  General anesthesia was administered and nasal endotracheal tube was placed and secured.  The eyes were protected.  The patient was draped  for surgery.  A timeout was performed.  Posterior pharynx was suctioned and a throat pack was placed.  2% lidocaine 1:100,000 epinephrine was infiltrated buccally and palatally around the teeth removed and then greater palatine nerve blocks and incisive  foramen nerve block.  A bite block was placed on the right side of the mouth.  A 15 blade was used to make an incision around teeth numbers 11, 13, and 15. The teeth were elevated easily and removed from the mouth with the dental forceps.  Then, the bite  block was placed in the other side of the mouth and the incision was created around teeth numbers 2 and 4.  The periosteum was reflected.  The teeth were elevated and removed from the mouth with the dental forceps.  The sockets were all curetted,  irrigated and then attention was turned to the palatal torus.  A longitudinal incision was created from the posterior aspect of the torus at the hard and soft palate junction along the midline towards the incisive papilla.  A  double Y was placed, one at  each end and then the periosteum was reflected with the Soil scientist.  Then, the Seldin retractor was used to retract the tissues to avoid damage and then the Stryker handpiece was used to make a longitudinal incision through the midline from posterior  to anterior and then some horizontal incisions were created dividing the torus into thirds on the right and left side.  Then, the osteotome and mallet were used to cleave the torus from the palatal bone.  Then, the remaining bony irregularity was  smoothed with the Stryker handpiece and fissure bur to smooth the midportion of the palate.  Then, the tissue was trimmed to allow for primary closure as there was abundance of tissue due to the large nature of the torus. The area was irrigated and then  closed with 4-0 chromic and 3-0 chromic.  Then, the oral cavity was irrigated and suctioned.  The throat pack was removed.  The patient was left under care of anesthesia for extubation and transported to recovery room with plans for discharge home  through day surgery.  ESTIMATED BLOOD LOSS:  Minimal.  COMPLICATIONS:  None.  SPECIMENS:  None.   MUK D: 07/08/2022 10:22:38 am T: 07/08/2022 10:58:00 am  JOB: [id number]

## 2022-07-08 NOTE — Anesthesia Procedure Notes (Signed)
Procedure Name: Intubation Date/Time: 07/08/2022 9:34 AM  Performed by: Reggie Pile, CRNAPre-anesthesia Checklist: Patient identified, Emergency Drugs available, Suction available and Patient being monitored Patient Re-evaluated:Patient Re-evaluated prior to induction Oxygen Delivery Method: Circle system utilized Preoxygenation: Pre-oxygenation with 100% oxygen Induction Type: IV induction Ventilation: Mask ventilation without difficulty Laryngoscope Size: Mac, Glidescope and 3 Grade View: Grade I Nasal Tubes: Nasal prep performed and Nasal Rae Tube size: 6.0 mm Number of attempts: 2 Placement Confirmation: ETT inserted through vocal cords under direct vision, positive ETCO2 and breath sounds checked- equal and bilateral Secured at: 24 cm Tube secured with: Tape Dental Injury: Teeth and Oropharynx as per pre-operative assessment  Comments: Afrin spray x 2 preop. Both nares, left nare dilated with nasal airways. 26, 28, 30.

## 2022-07-13 ENCOUNTER — Encounter (INDEPENDENT_AMBULATORY_CARE_PROVIDER_SITE_OTHER): Payer: 59 | Admitting: Ophthalmology

## 2022-07-13 DIAGNOSIS — H4312 Vitreous hemorrhage, left eye: Secondary | ICD-10-CM | POA: Diagnosis not present

## 2022-07-13 DIAGNOSIS — H35033 Hypertensive retinopathy, bilateral: Secondary | ICD-10-CM

## 2022-07-13 DIAGNOSIS — I10 Essential (primary) hypertension: Secondary | ICD-10-CM

## 2022-07-13 DIAGNOSIS — H43813 Vitreous degeneration, bilateral: Secondary | ICD-10-CM | POA: Diagnosis not present

## 2022-07-13 DIAGNOSIS — E113591 Type 2 diabetes mellitus with proliferative diabetic retinopathy without macular edema, right eye: Secondary | ICD-10-CM | POA: Diagnosis not present

## 2022-07-13 DIAGNOSIS — E113512 Type 2 diabetes mellitus with proliferative diabetic retinopathy with macular edema, left eye: Secondary | ICD-10-CM

## 2022-07-20 DIAGNOSIS — I10 Essential (primary) hypertension: Secondary | ICD-10-CM | POA: Diagnosis not present

## 2022-07-20 DIAGNOSIS — E113553 Type 2 diabetes mellitus with stable proliferative diabetic retinopathy, bilateral: Secondary | ICD-10-CM | POA: Diagnosis not present

## 2022-07-20 DIAGNOSIS — E7800 Pure hypercholesterolemia, unspecified: Secondary | ICD-10-CM | POA: Diagnosis not present

## 2022-07-20 DIAGNOSIS — I25118 Atherosclerotic heart disease of native coronary artery with other forms of angina pectoris: Secondary | ICD-10-CM | POA: Diagnosis not present

## 2022-07-28 ENCOUNTER — Inpatient Hospital Stay: Payer: 59

## 2022-07-28 ENCOUNTER — Encounter: Payer: Self-pay | Admitting: Hematology and Oncology

## 2022-07-28 ENCOUNTER — Other Ambulatory Visit: Payer: Self-pay

## 2022-07-28 ENCOUNTER — Inpatient Hospital Stay: Payer: 59 | Attending: Hematology and Oncology | Admitting: Hematology and Oncology

## 2022-07-28 VITALS — BP 133/46 | HR 64 | Temp 98.1°F | Resp 18

## 2022-07-28 VITALS — BP 139/56 | HR 69 | Temp 97.9°F | Wt 134.2 lb

## 2022-07-28 DIAGNOSIS — I129 Hypertensive chronic kidney disease with stage 1 through stage 4 chronic kidney disease, or unspecified chronic kidney disease: Secondary | ICD-10-CM | POA: Diagnosis not present

## 2022-07-28 DIAGNOSIS — E1159 Type 2 diabetes mellitus with other circulatory complications: Secondary | ICD-10-CM

## 2022-07-28 DIAGNOSIS — N1832 Chronic kidney disease, stage 3b: Secondary | ICD-10-CM | POA: Insufficient documentation

## 2022-07-28 DIAGNOSIS — D539 Nutritional anemia, unspecified: Secondary | ICD-10-CM | POA: Diagnosis not present

## 2022-07-28 DIAGNOSIS — I152 Hypertension secondary to endocrine disorders: Secondary | ICD-10-CM | POA: Diagnosis not present

## 2022-07-28 DIAGNOSIS — E1122 Type 2 diabetes mellitus with diabetic chronic kidney disease: Secondary | ICD-10-CM | POA: Insufficient documentation

## 2022-07-28 DIAGNOSIS — D631 Anemia in chronic kidney disease: Secondary | ICD-10-CM | POA: Insufficient documentation

## 2022-07-28 LAB — CBC WITH DIFFERENTIAL/PLATELET
Abs Immature Granulocytes: 0.08 10*3/uL — ABNORMAL HIGH (ref 0.00–0.07)
Basophils Absolute: 0.1 10*3/uL (ref 0.0–0.1)
Basophils Relative: 1 %
Eosinophils Absolute: 0.2 10*3/uL (ref 0.0–0.5)
Eosinophils Relative: 2 %
HCT: 31 % — ABNORMAL LOW (ref 36.0–46.0)
Hemoglobin: 10.1 g/dL — ABNORMAL LOW (ref 12.0–15.0)
Immature Granulocytes: 1 %
Lymphocytes Relative: 18 %
Lymphs Abs: 2 10*3/uL (ref 0.7–4.0)
MCH: 28.9 pg (ref 26.0–34.0)
MCHC: 32.6 g/dL (ref 30.0–36.0)
MCV: 88.6 fL (ref 80.0–100.0)
Monocytes Absolute: 0.7 10*3/uL (ref 0.1–1.0)
Monocytes Relative: 6 %
Neutro Abs: 8.1 10*3/uL — ABNORMAL HIGH (ref 1.7–7.7)
Neutrophils Relative %: 72 %
Platelets: 289 10*3/uL (ref 150–400)
RBC: 3.5 MIL/uL — ABNORMAL LOW (ref 3.87–5.11)
RDW: 14.6 % (ref 11.5–15.5)
WBC: 11.2 10*3/uL — ABNORMAL HIGH (ref 4.0–10.5)
nRBC: 0 % (ref 0.0–0.2)

## 2022-07-28 MED ORDER — EPOETIN ALFA-EPBX 40000 UNIT/ML IJ SOLN
40000.0000 [IU] | Freq: Once | INTRAMUSCULAR | Status: AC
  Administered 2022-07-28: 40000 [IU] via SUBCUTANEOUS
  Filled 2022-07-28: qty 1

## 2022-07-28 NOTE — Assessment & Plan Note (Signed)
Overall, she has anemia of chronic renal failure She tolerated increased dose of Procrit 40,000 units; based on the trend of his recent blood work, she will get an injection about once a month I plan to see her again in 12 months for further follow-up

## 2022-07-28 NOTE — Assessment & Plan Note (Signed)
Her blood pressure is stable She will continue medical management

## 2022-07-28 NOTE — Patient Instructions (Signed)

## 2022-07-28 NOTE — Progress Notes (Signed)
[facility] OFFICE PROGRESS NOTE  Aliene Beams, MD  ASSESSMENT & PLAN:  Deficiency anemia Overall, she has anemia of chronic renal failure She tolerated increased dose of Procrit 40,000 units; based on the trend of his recent blood work, she will get an injection about once a month I plan to see her again in 12 months for further follow-up  Hypertension associated with diabetes (HCC) Her blood pressure is stable She will continue medical management  Orders Placed This Encounter  Procedures   Iron and Iron Binding Capacity ([facility] only)    Standing Status:   Future    Standing Expiration Date:   07/28/2023   Ferritin    Standing Status:   Future    Standing Expiration Date:   07/28/2023   Vitamin B12    Standing Status:   Future    Standing Expiration Date:   07/28/2023    The total time spent in the appointment was 20 minutes encounter with patients including review of chart and various tests results, discussions about plan of care and coordination of care plan   All questions were answered. The patient knows to call the clinic with any problems, questions or concerns. No barriers to learning was detected.    Artis Delay, MD 4/18/202412:36 PM  INTERVAL HISTORY: [name]. [name] 77 y.o. female returns for further follow-up for anemia chronic kidney disease She is doing well She had recent dental extraction and tolerated that fine No recent bleeding Denies side effects from erythropoietin stimulating agents  SUMMARY OF HEMATOLOGIC HISTORY:  The patient was seen by multiple different physicians in the past She was originally seen by Dr. Arline Asp, and then Dr. Rosie Fate, and last seen here in 2016 by Dr. Truett Perna She has remote history of colon cancer diagnosed in 2006, stage II (T3 N0), status post a right colectomy 03/07/2005. She received adjuvant FOLFOX for 4 cycles followed by 8 cycles of 5-FU/leucovorin.  She had imaging study with CT scan of the abdomen  and pelvis in 2019 which show no evidence of recurrence.  Her last colonoscopy was on July 14, 2016 which show no evidence of disease.  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  No other abnormalities were noted She also have history of meningioma and follow with neurosurgery for periodic imaging study.  Her last MRI of the brain was in Aug 11, 2015 which show stable meningioma  She was found to have abnormal CBC from recent blood work.  On review of her blood work, it is noted that she has progressive anemia over the last few years, low was around 8.8 In July of this year, she had iron studies which showed normal iron storage.  She is noted to have progressive renal disease. Starting around 2018, she had progressive renal dysfunction, with serum creatinine fluctuate usually under 2 but in January of this year, her creatinine went up to as high as 2.2. Estimated EGFR put her at chronic kidney disease stage III.  She has multiple cardiovascular risk factors including poorly controlled diabetes, peripheral vascular disease, chronic kidney disease, coronary artery disease and hypertension  She denies recent chest pain on exertion, shortness of breath on minimal exertion, pre-syncopal episodes, or palpitations. She had not noticed any recent bleeding such as epistaxis, hematuria or hematochezia The patient denies over the counter NSAID ingestion. She is on antiplatelets agents.  She denies any pica and  eats a variety of diet. She had received blood transfusion when she was diagnosed with colon cancer Her granddaughter noticed recurrent falls.  She denies generalized weakness.  She denies peripheral neuropathy from prior treatment or diabetes She is started on Epoetin injection for anemia chronic kidney disease on February 10, 2020  I have reviewed the past medical history, past surgical history, social history and family  history with the patient and they are unchanged from previous note.  ALLERGIES:  is allergic to metformin and related.  MEDICATIONS:  Current Outpatient Medications  Medication Sig Dispense Refill   amLODipine (NORVASC) 5 MG tablet TAKE ONE TABLET BY MOUTH EVERY MORNING 90 tablet 2   aspirin 81 MG chewable tablet Chew 81 mg by mouth daily.     clopidogrel (PLAVIX) 75 MG tablet TAKE ONE TABLET BY MOUTH EVERY MORNING 90 tablet 2   famotidine (PEPCID) 20 MG tablet TAKE 1 TABLET(20 MG) BY MOUTH TWICE DAILY AS NEEDED FOR HEARTBURN OR INDIGESTION 90 tablet 2   glipiZIDE (GLUCOTROL XL) 10 MG 24 hr tablet Take 10 mg by mouth daily.     GLYXAMBI 25-5 MG TABS Take 1 tablet by mouth every morning.     HYDROcodone-acetaminophen (NORCO) 5-325 MG tablet Take 1 tablet by mouth every 6 (six) hours as needed for moderate pain. 12 tablet 0   isosorbide mononitrate (IMDUR) 60 MG 24 hr tablet TAKE ONE TABLET BY MOUTH ONCE DAILY 90 tablet 3   losartan-hydrochlorothiazide (HYZAAR) 50-12.5 MG tablet Take 1 tablet by mouth daily. 90 tablet 3   meclizine (ANTIVERT) 25 MG tablet SMARTSIG:1 Tablet(s) By Mouth Every 12 Hours PRN     nitroGLYCERIN (NITROSTAT) 0.4 MG SL tablet Place 1 tablet (0.4 mg total) under the tongue every 5 (five) minutes as needed for chest pain. 25 tablet 5   polyethylene glycol powder (GLYCOLAX/MIRALAX) 17 GM/SCOOP powder Take 17 g by mouth daily. (Patient taking differently: Take 17 g by mouth daily as needed for mild constipation or moderate constipation.) 3350 g 1   prednisoLONE acetate (PRED FORTE) 1 % ophthalmic suspension Place 1 drop into the left eye.     PRODIGY NO CODING BLOOD GLUC test strip TESTING ONCE DAILY.     Prodigy Twist Top Lancets 28G MISC TESTING ONCE DAILY.     rosuvastatin (CRESTOR) 20 MG tablet TAKE ONE TABLET BY MOUTH EVERY MORNING (Patient taking differently: Take 5 mg by mouth every morning.) 90 tablet 3   Current Facility-Administered Medications  Medication Dose  Route Frequency Provider Last Rate Last Admin   sodium chloride flush (NS) 0.9 % injection 3 mL  3 mL Intravenous Q12H Runell Gess, MD       Facility-Administered Medications Ordered in Other Visits  Medication Dose Route Frequency Provider Last Rate Last Admin   epoetin alfa-epbx (RETACRIT) injection 40,000 Units  40,000 Units Subcutaneous Once Artis Delay, MD         REVIEW OF SYSTEMS:   Constitutional: Denies fevers, chills or night sweats Eyes: Denies blurriness of vision Ears, nose, mouth, throat, and face: Denies mucositis or sore throat Respiratory: Denies cough, dyspnea or wheezes Cardiovascular: Denies palpitation, chest discomfort or lower extremity swelling Gastrointestinal:  Denies nausea, heartburn or change in bowel habits Skin: Denies abnormal skin rashes Lymphatics: Denies new lymphadenopathy or easy bruising Neurological:Denies numbness, tingling or new weaknesses Behavioral/Psych: Mood is stable, no new changes  All other systems were reviewed with the patient and are negative.  PHYSICAL EXAMINATION: ECOG PERFORMANCE STATUS: 0 - Asymptomatic  Vitals:   07/28/22 1157  BP: (!) 139/56  Pulse: 69  Temp: 97.9 F (36.6 C)  SpO2: 100%   Filed Weights   07/28/22 1157  Weight: 134 lb 3.2 oz (60.9 kg)    GENERAL:alert, no distress and comfortable  NEURO: alert & oriented x 3 with fluent speech, no focal motor/sensory deficits  LABORATORY DATA:  I have reviewed the data as listed     Component Value Date/Time   NA 141 07/08/2022 0711   NA 149 (H) 05/19/2021 1508   NA 143 05/15/2014 1154   K 3.3 (L) 07/08/2022 0711   K 3.6 05/15/2014 1154   CL 107 07/08/2022 0711   CL 101 05/11/2012 1544   CO2 23 07/08/2022 0711   CO2 24 05/15/2014 1154   GLUCOSE 167 (H) 07/08/2022 0711   GLUCOSE 143 (H) 05/15/2014 1154   GLUCOSE 272 (H) 05/11/2012 1544   BUN 40 (H) 07/08/2022 0711   BUN 37 (H) 05/19/2021 1508   BUN 17.3 05/15/2014 1154   CREATININE 2.18 (H)  07/08/2022 0711   CREATININE 1.48 (H) 10/28/2020 1208   CREATININE 1.06 (H) 07/20/2015 1641   CREATININE 1.0 05/15/2014 1154   CALCIUM 9.6 07/08/2022 0711   CALCIUM 9.6 05/15/2014 1154   PROT 8.0 05/05/2022 1215   PROT 6.8 12/08/2020 1051   PROT 7.0 05/15/2014 1154   ALBUMIN 4.5 05/05/2022 1215   ALBUMIN 4.2 12/08/2020 1051   ALBUMIN 3.7 05/15/2014 1154   AST 15 05/05/2022 1215   AST 19 10/28/2020 1208   AST 13 05/15/2014 1154   ALT 10 05/05/2022 1215   ALT 15 10/28/2020 1208   ALT 9 05/15/2014 1154   ALKPHOS 69 05/05/2022 1215   ALKPHOS 94 05/15/2014 1154   BILITOT 0.6 05/05/2022 1215   BILITOT 0.6 12/08/2020 1051   BILITOT 0.7 10/28/2020 1208   BILITOT 0.68 05/15/2014 1154   GFRNONAA 23 (L) 07/08/2022 0711   GFRNONAA 37 (L) 10/28/2020 1208   GFRNONAA 54 (L) 07/20/2015 1641   GFRAA 36 (L) 04/09/2020 1024   GFRAA 62 07/20/2015 1641    No results found for: "SPEP", "UPEP"  Lab Results  Component Value Date   WBC 11.2 (H) 07/28/2022   NEUTROABS 8.1 (H) 07/28/2022   HGB 10.1 (L) 07/28/2022   HCT 31.0 (L) 07/28/2022   MCV 88.6 07/28/2022   PLT 289 07/28/2022      Chemistry      Component Value Date/Time   NA 141 07/08/2022 0711   NA 149 (H) 05/19/2021 1508   NA 143 05/15/2014 1154   K 3.3 (L) 07/08/2022 0711   K 3.6 05/15/2014 1154   CL 107 07/08/2022 0711   CL 101 05/11/2012 1544   CO2 23 07/08/2022 0711   CO2 24 05/15/2014 1154   BUN 40 (H) 07/08/2022 0711   BUN 37 (H) 05/19/2021 1508   BUN 17.3 05/15/2014 1154   CREATININE 2.18 (H) 07/08/2022 0711   CREATININE 1.48 (H) 10/28/2020 1208   CREATININE 1.06 (H) 07/20/2015 1641   CREATININE 1.0 05/15/2014 1154      Component Value Date/Time   CALCIUM 9.6 07/08/2022 0711   CALCIUM 9.6 05/15/2014 1154   ALKPHOS 69 05/05/2022 1215   ALKPHOS 94 05/15/2014 1154   AST 15 05/05/2022 1215   AST 19 10/28/2020 1208   AST 13 05/15/2014 1154   ALT 10 05/05/2022 1215   ALT 15 10/28/2020 1208   ALT 9 05/15/2014  1154   BILITOT 0.6 05/05/2022 1215  BILITOT 0.6 12/08/2020 1051   BILITOT 0.7 10/28/2020 1208   BILITOT 0.68 05/15/2014 1154

## 2022-08-10 ENCOUNTER — Encounter (INDEPENDENT_AMBULATORY_CARE_PROVIDER_SITE_OTHER): Payer: 59 | Admitting: Ophthalmology

## 2022-08-10 DIAGNOSIS — E113512 Type 2 diabetes mellitus with proliferative diabetic retinopathy with macular edema, left eye: Secondary | ICD-10-CM | POA: Diagnosis not present

## 2022-08-10 DIAGNOSIS — E113591 Type 2 diabetes mellitus with proliferative diabetic retinopathy without macular edema, right eye: Secondary | ICD-10-CM

## 2022-08-10 DIAGNOSIS — H43813 Vitreous degeneration, bilateral: Secondary | ICD-10-CM | POA: Diagnosis not present

## 2022-08-10 DIAGNOSIS — I10 Essential (primary) hypertension: Secondary | ICD-10-CM | POA: Diagnosis not present

## 2022-08-10 DIAGNOSIS — H35033 Hypertensive retinopathy, bilateral: Secondary | ICD-10-CM | POA: Diagnosis not present

## 2022-08-18 DIAGNOSIS — E113553 Type 2 diabetes mellitus with stable proliferative diabetic retinopathy, bilateral: Secondary | ICD-10-CM | POA: Diagnosis not present

## 2022-08-18 DIAGNOSIS — I25118 Atherosclerotic heart disease of native coronary artery with other forms of angina pectoris: Secondary | ICD-10-CM | POA: Diagnosis not present

## 2022-08-18 DIAGNOSIS — I10 Essential (primary) hypertension: Secondary | ICD-10-CM | POA: Diagnosis not present

## 2022-08-18 DIAGNOSIS — E7800 Pure hypercholesterolemia, unspecified: Secondary | ICD-10-CM | POA: Diagnosis not present

## 2022-08-26 ENCOUNTER — Inpatient Hospital Stay: Payer: 59

## 2022-08-26 ENCOUNTER — Inpatient Hospital Stay: Payer: 59 | Attending: Hematology and Oncology

## 2022-08-26 ENCOUNTER — Other Ambulatory Visit: Payer: Self-pay

## 2022-08-26 VITALS — BP 127/50 | HR 57 | Temp 98.6°F | Resp 16

## 2022-08-26 DIAGNOSIS — D539 Nutritional anemia, unspecified: Secondary | ICD-10-CM

## 2022-08-26 DIAGNOSIS — N1832 Chronic kidney disease, stage 3b: Secondary | ICD-10-CM

## 2022-08-26 DIAGNOSIS — D631 Anemia in chronic kidney disease: Secondary | ICD-10-CM | POA: Insufficient documentation

## 2022-08-26 LAB — CBC WITH DIFFERENTIAL/PLATELET
Abs Immature Granulocytes: 0.04 10*3/uL (ref 0.00–0.07)
Basophils Absolute: 0 10*3/uL (ref 0.0–0.1)
Basophils Relative: 1 %
Eosinophils Absolute: 0.1 10*3/uL (ref 0.0–0.5)
Eosinophils Relative: 2 %
HCT: 31.8 % — ABNORMAL LOW (ref 36.0–46.0)
Hemoglobin: 10.2 g/dL — ABNORMAL LOW (ref 12.0–15.0)
Immature Granulocytes: 1 %
Lymphocytes Relative: 25 %
Lymphs Abs: 2 10*3/uL (ref 0.7–4.0)
MCH: 28.3 pg (ref 26.0–34.0)
MCHC: 32.1 g/dL (ref 30.0–36.0)
MCV: 88.3 fL (ref 80.0–100.0)
Monocytes Absolute: 0.5 10*3/uL (ref 0.1–1.0)
Monocytes Relative: 7 %
Neutro Abs: 5.3 10*3/uL (ref 1.7–7.7)
Neutrophils Relative %: 64 %
Platelets: 237 10*3/uL (ref 150–400)
RBC: 3.6 MIL/uL — ABNORMAL LOW (ref 3.87–5.11)
RDW: 14.1 % (ref 11.5–15.5)
WBC: 8.1 10*3/uL (ref 4.0–10.5)
nRBC: 0 % (ref 0.0–0.2)

## 2022-08-26 MED ORDER — EPOETIN ALFA-EPBX 40000 UNIT/ML IJ SOLN
40000.0000 [IU] | Freq: Once | INTRAMUSCULAR | Status: AC
  Administered 2022-08-26: 40000 [IU] via SUBCUTANEOUS
  Filled 2022-08-26: qty 1

## 2022-08-26 NOTE — Patient Instructions (Signed)

## 2022-09-07 ENCOUNTER — Encounter (INDEPENDENT_AMBULATORY_CARE_PROVIDER_SITE_OTHER): Payer: 59 | Admitting: Ophthalmology

## 2022-09-07 DIAGNOSIS — Z794 Long term (current) use of insulin: Secondary | ICD-10-CM

## 2022-09-07 DIAGNOSIS — H35033 Hypertensive retinopathy, bilateral: Secondary | ICD-10-CM

## 2022-09-07 DIAGNOSIS — I10 Essential (primary) hypertension: Secondary | ICD-10-CM | POA: Diagnosis not present

## 2022-09-07 DIAGNOSIS — E113512 Type 2 diabetes mellitus with proliferative diabetic retinopathy with macular edema, left eye: Secondary | ICD-10-CM

## 2022-09-07 DIAGNOSIS — E113591 Type 2 diabetes mellitus with proliferative diabetic retinopathy without macular edema, right eye: Secondary | ICD-10-CM

## 2022-09-07 DIAGNOSIS — H43813 Vitreous degeneration, bilateral: Secondary | ICD-10-CM

## 2022-09-16 DIAGNOSIS — I10 Essential (primary) hypertension: Secondary | ICD-10-CM | POA: Diagnosis not present

## 2022-09-16 DIAGNOSIS — E7800 Pure hypercholesterolemia, unspecified: Secondary | ICD-10-CM | POA: Diagnosis not present

## 2022-09-16 DIAGNOSIS — I25118 Atherosclerotic heart disease of native coronary artery with other forms of angina pectoris: Secondary | ICD-10-CM | POA: Diagnosis not present

## 2022-09-16 DIAGNOSIS — E113553 Type 2 diabetes mellitus with stable proliferative diabetic retinopathy, bilateral: Secondary | ICD-10-CM | POA: Diagnosis not present

## 2022-09-21 ENCOUNTER — Other Ambulatory Visit: Payer: Self-pay

## 2022-09-21 DIAGNOSIS — R079 Chest pain, unspecified: Secondary | ICD-10-CM

## 2022-09-21 MED ORDER — NITROGLYCERIN 0.4 MG SL SUBL: 0.4000 mg | SUBLINGUAL_TABLET | SUBLINGUAL | 7 refills | Status: DC | PRN

## 2022-09-21 MED ORDER — CLOPIDOGREL BISULFATE 75 MG PO TABS: ORAL_TABLET | ORAL | 2 refills | Status: DC

## 2022-09-21 MED ORDER — AMLODIPINE BESYLATE 5 MG PO TABS: 5.0000 mg | ORAL_TABLET | Freq: Every morning | ORAL | 2 refills | Status: DC

## 2022-09-21 NOTE — Addendum Note (Signed)
Addended by: Margaret Pyle D on: 09/21/2022 12:42 PM   Modules accepted: Orders

## 2022-09-21 NOTE — Addendum Note (Signed)
Addended by: Margaret Pyle D on: 09/21/2022 12:41 PM   Modules accepted: Orders

## 2022-09-23 DIAGNOSIS — N1832 Chronic kidney disease, stage 3b: Secondary | ICD-10-CM | POA: Diagnosis not present

## 2022-09-27 ENCOUNTER — Inpatient Hospital Stay: Payer: 59 | Attending: Hematology and Oncology

## 2022-09-27 ENCOUNTER — Inpatient Hospital Stay: Payer: 59

## 2022-09-27 ENCOUNTER — Other Ambulatory Visit: Payer: Self-pay

## 2022-09-27 VITALS — BP 140/48 | HR 48 | Temp 98.1°F | Resp 16

## 2022-09-27 DIAGNOSIS — N1832 Chronic kidney disease, stage 3b: Secondary | ICD-10-CM | POA: Insufficient documentation

## 2022-09-27 DIAGNOSIS — D539 Nutritional anemia, unspecified: Secondary | ICD-10-CM

## 2022-09-27 DIAGNOSIS — D631 Anemia in chronic kidney disease: Secondary | ICD-10-CM | POA: Diagnosis not present

## 2022-09-27 LAB — CBC WITH DIFFERENTIAL/PLATELET
Abs Immature Granulocytes: 0.03 10*3/uL (ref 0.00–0.07)
Basophils Absolute: 0 10*3/uL (ref 0.0–0.1)
Basophils Relative: 0 %
Eosinophils Absolute: 0.2 10*3/uL (ref 0.0–0.5)
Eosinophils Relative: 3 %
HCT: 32.5 % — ABNORMAL LOW (ref 36.0–46.0)
Hemoglobin: 10.5 g/dL — ABNORMAL LOW (ref 12.0–15.0)
Immature Granulocytes: 0 %
Lymphocytes Relative: 27 %
Lymphs Abs: 2.2 10*3/uL (ref 0.7–4.0)
MCH: 28.8 pg (ref 26.0–34.0)
MCHC: 32.3 g/dL (ref 30.0–36.0)
MCV: 89.3 fL (ref 80.0–100.0)
Monocytes Absolute: 0.5 10*3/uL (ref 0.1–1.0)
Monocytes Relative: 6 %
Neutro Abs: 5.2 10*3/uL (ref 1.7–7.7)
Neutrophils Relative %: 64 %
Platelets: 254 10*3/uL (ref 150–400)
RBC: 3.64 MIL/uL — ABNORMAL LOW (ref 3.87–5.11)
RDW: 14.6 % (ref 11.5–15.5)
WBC: 8.1 10*3/uL (ref 4.0–10.5)
nRBC: 0 % (ref 0.0–0.2)

## 2022-09-27 LAB — FERRITIN: Ferritin: 29 ng/mL (ref 11–307)

## 2022-09-27 LAB — IRON AND IRON BINDING CAPACITY (CC-WL,HP ONLY)
Iron: 92 ug/dL (ref 28–170)
Saturation Ratios: 26 % (ref 10.4–31.8)
TIBC: 351 ug/dL (ref 250–450)
UIBC: 259 ug/dL (ref 148–442)

## 2022-09-27 LAB — VITAMIN B12: Vitamin B-12: 236 pg/mL (ref 180–914)

## 2022-09-27 MED ORDER — EPOETIN ALFA-EPBX 40000 UNIT/ML IJ SOLN
40000.0000 [IU] | Freq: Once | INTRAMUSCULAR | Status: AC
  Administered 2022-09-27: 40000 [IU] via SUBCUTANEOUS
  Filled 2022-09-27: qty 1

## 2022-10-03 DIAGNOSIS — E559 Vitamin D deficiency, unspecified: Secondary | ICD-10-CM | POA: Diagnosis not present

## 2022-10-03 DIAGNOSIS — D631 Anemia in chronic kidney disease: Secondary | ICD-10-CM | POA: Diagnosis not present

## 2022-10-03 DIAGNOSIS — E1122 Type 2 diabetes mellitus with diabetic chronic kidney disease: Secondary | ICD-10-CM | POA: Diagnosis not present

## 2022-10-03 DIAGNOSIS — N1832 Chronic kidney disease, stage 3b: Secondary | ICD-10-CM | POA: Diagnosis not present

## 2022-10-03 DIAGNOSIS — I129 Hypertensive chronic kidney disease with stage 1 through stage 4 chronic kidney disease, or unspecified chronic kidney disease: Secondary | ICD-10-CM | POA: Diagnosis not present

## 2022-10-04 DIAGNOSIS — I10 Essential (primary) hypertension: Secondary | ICD-10-CM | POA: Diagnosis not present

## 2022-10-04 DIAGNOSIS — E113553 Type 2 diabetes mellitus with stable proliferative diabetic retinopathy, bilateral: Secondary | ICD-10-CM | POA: Diagnosis not present

## 2022-10-04 DIAGNOSIS — I25118 Atherosclerotic heart disease of native coronary artery with other forms of angina pectoris: Secondary | ICD-10-CM | POA: Diagnosis not present

## 2022-10-04 DIAGNOSIS — H548 Legal blindness, as defined in USA: Secondary | ICD-10-CM | POA: Diagnosis not present

## 2022-10-04 DIAGNOSIS — E1165 Type 2 diabetes mellitus with hyperglycemia: Secondary | ICD-10-CM | POA: Diagnosis not present

## 2022-10-04 DIAGNOSIS — E1151 Type 2 diabetes mellitus with diabetic peripheral angiopathy without gangrene: Secondary | ICD-10-CM | POA: Diagnosis not present

## 2022-10-04 DIAGNOSIS — H6121 Impacted cerumen, right ear: Secondary | ICD-10-CM | POA: Diagnosis not present

## 2022-10-04 DIAGNOSIS — E7800 Pure hypercholesterolemia, unspecified: Secondary | ICD-10-CM | POA: Diagnosis not present

## 2022-10-04 DIAGNOSIS — E1122 Type 2 diabetes mellitus with diabetic chronic kidney disease: Secondary | ICD-10-CM | POA: Diagnosis not present

## 2022-10-04 DIAGNOSIS — N184 Chronic kidney disease, stage 4 (severe): Secondary | ICD-10-CM | POA: Diagnosis not present

## 2022-10-04 DIAGNOSIS — D320 Benign neoplasm of cerebral meninges: Secondary | ICD-10-CM | POA: Diagnosis not present

## 2022-10-05 ENCOUNTER — Encounter (INDEPENDENT_AMBULATORY_CARE_PROVIDER_SITE_OTHER): Payer: 59 | Admitting: Ophthalmology

## 2022-10-05 DIAGNOSIS — Z794 Long term (current) use of insulin: Secondary | ICD-10-CM

## 2022-10-05 DIAGNOSIS — H43813 Vitreous degeneration, bilateral: Secondary | ICD-10-CM | POA: Diagnosis not present

## 2022-10-05 DIAGNOSIS — E113591 Type 2 diabetes mellitus with proliferative diabetic retinopathy without macular edema, right eye: Secondary | ICD-10-CM | POA: Diagnosis not present

## 2022-10-05 DIAGNOSIS — H35033 Hypertensive retinopathy, bilateral: Secondary | ICD-10-CM

## 2022-10-05 DIAGNOSIS — I10 Essential (primary) hypertension: Secondary | ICD-10-CM

## 2022-10-05 DIAGNOSIS — E113512 Type 2 diabetes mellitus with proliferative diabetic retinopathy with macular edema, left eye: Secondary | ICD-10-CM | POA: Diagnosis not present

## 2022-10-27 ENCOUNTER — Inpatient Hospital Stay: Payer: 59 | Attending: Hematology and Oncology

## 2022-10-27 ENCOUNTER — Inpatient Hospital Stay: Payer: 59

## 2022-10-27 ENCOUNTER — Other Ambulatory Visit: Payer: Self-pay

## 2022-10-27 DIAGNOSIS — N1832 Chronic kidney disease, stage 3b: Secondary | ICD-10-CM | POA: Insufficient documentation

## 2022-10-27 DIAGNOSIS — D631 Anemia in chronic kidney disease: Secondary | ICD-10-CM | POA: Diagnosis not present

## 2022-10-27 DIAGNOSIS — D539 Nutritional anemia, unspecified: Secondary | ICD-10-CM

## 2022-10-27 LAB — CBC WITH DIFFERENTIAL/PLATELET
Abs Immature Granulocytes: 0.02 10*3/uL (ref 0.00–0.07)
Basophils Absolute: 0 10*3/uL (ref 0.0–0.1)
Basophils Relative: 1 %
Eosinophils Absolute: 0.2 10*3/uL (ref 0.0–0.5)
Eosinophils Relative: 3 %
HCT: 35.2 % — ABNORMAL LOW (ref 36.0–46.0)
Hemoglobin: 11.4 g/dL — ABNORMAL LOW (ref 12.0–15.0)
Immature Granulocytes: 0 %
Lymphocytes Relative: 27 %
Lymphs Abs: 2 10*3/uL (ref 0.7–4.0)
MCH: 28.6 pg (ref 26.0–34.0)
MCHC: 32.4 g/dL (ref 30.0–36.0)
MCV: 88.4 fL (ref 80.0–100.0)
Monocytes Absolute: 0.5 10*3/uL (ref 0.1–1.0)
Monocytes Relative: 7 %
Neutro Abs: 4.6 10*3/uL (ref 1.7–7.7)
Neutrophils Relative %: 62 %
Platelets: 221 10*3/uL (ref 150–400)
RBC: 3.98 MIL/uL (ref 3.87–5.11)
RDW: 14.8 % (ref 11.5–15.5)
WBC: 7.3 10*3/uL (ref 4.0–10.5)
nRBC: 0 % (ref 0.0–0.2)

## 2022-10-27 NOTE — Progress Notes (Signed)
Patient here for retacrit injection, Hgb 11.4 today. Labs printed off and given to patient. Patient verbalized understanding.

## 2022-11-02 ENCOUNTER — Encounter (INDEPENDENT_AMBULATORY_CARE_PROVIDER_SITE_OTHER): Payer: 59 | Admitting: Ophthalmology

## 2022-11-02 DIAGNOSIS — E113512 Type 2 diabetes mellitus with proliferative diabetic retinopathy with macular edema, left eye: Secondary | ICD-10-CM | POA: Diagnosis not present

## 2022-11-02 DIAGNOSIS — H35033 Hypertensive retinopathy, bilateral: Secondary | ICD-10-CM | POA: Diagnosis not present

## 2022-11-02 DIAGNOSIS — Z794 Long term (current) use of insulin: Secondary | ICD-10-CM

## 2022-11-02 DIAGNOSIS — H43813 Vitreous degeneration, bilateral: Secondary | ICD-10-CM

## 2022-11-02 DIAGNOSIS — I10 Essential (primary) hypertension: Secondary | ICD-10-CM

## 2022-11-02 DIAGNOSIS — E113591 Type 2 diabetes mellitus with proliferative diabetic retinopathy without macular edema, right eye: Secondary | ICD-10-CM

## 2022-11-28 ENCOUNTER — Inpatient Hospital Stay: Payer: 59 | Attending: Hematology and Oncology

## 2022-11-28 ENCOUNTER — Inpatient Hospital Stay: Payer: 59

## 2022-11-28 VITALS — BP 128/57 | HR 55 | Temp 98.0°F | Resp 16

## 2022-11-28 DIAGNOSIS — D631 Anemia in chronic kidney disease: Secondary | ICD-10-CM | POA: Insufficient documentation

## 2022-11-28 DIAGNOSIS — N1832 Chronic kidney disease, stage 3b: Secondary | ICD-10-CM

## 2022-11-28 DIAGNOSIS — D539 Nutritional anemia, unspecified: Secondary | ICD-10-CM

## 2022-11-28 LAB — CBC WITH DIFFERENTIAL/PLATELET
Abs Immature Granulocytes: 0.03 10*3/uL (ref 0.00–0.07)
Basophils Absolute: 0 10*3/uL (ref 0.0–0.1)
Basophils Relative: 1 %
Eosinophils Absolute: 0.1 10*3/uL (ref 0.0–0.5)
Eosinophils Relative: 2 %
HCT: 28.7 % — ABNORMAL LOW (ref 36.0–46.0)
Hemoglobin: 9.4 g/dL — ABNORMAL LOW (ref 12.0–15.0)
Immature Granulocytes: 0 %
Lymphocytes Relative: 25 %
Lymphs Abs: 2.1 10*3/uL (ref 0.7–4.0)
MCH: 29.2 pg (ref 26.0–34.0)
MCHC: 32.8 g/dL (ref 30.0–36.0)
MCV: 89.1 fL (ref 80.0–100.0)
Monocytes Absolute: 0.5 10*3/uL (ref 0.1–1.0)
Monocytes Relative: 6 %
Neutro Abs: 5.5 10*3/uL (ref 1.7–7.7)
Neutrophils Relative %: 66 %
Platelets: 203 10*3/uL (ref 150–400)
RBC: 3.22 MIL/uL — ABNORMAL LOW (ref 3.87–5.11)
RDW: 14.3 % (ref 11.5–15.5)
WBC: 8.4 10*3/uL (ref 4.0–10.5)
nRBC: 0 % (ref 0.0–0.2)

## 2022-11-28 MED ORDER — EPOETIN ALFA-EPBX 40000 UNIT/ML IJ SOLN
40000.0000 [IU] | Freq: Once | INTRAMUSCULAR | Status: AC
  Administered 2022-11-28: 40000 [IU] via SUBCUTANEOUS
  Filled 2022-11-28: qty 1

## 2022-11-28 NOTE — Patient Instructions (Signed)

## 2022-11-30 ENCOUNTER — Encounter (INDEPENDENT_AMBULATORY_CARE_PROVIDER_SITE_OTHER): Payer: 59 | Admitting: Ophthalmology

## 2022-11-30 DIAGNOSIS — H35033 Hypertensive retinopathy, bilateral: Secondary | ICD-10-CM

## 2022-11-30 DIAGNOSIS — H35372 Puckering of macula, left eye: Secondary | ICD-10-CM

## 2022-11-30 DIAGNOSIS — E113512 Type 2 diabetes mellitus with proliferative diabetic retinopathy with macular edema, left eye: Secondary | ICD-10-CM

## 2022-11-30 DIAGNOSIS — E113591 Type 2 diabetes mellitus with proliferative diabetic retinopathy without macular edema, right eye: Secondary | ICD-10-CM | POA: Diagnosis not present

## 2022-11-30 DIAGNOSIS — Z794 Long term (current) use of insulin: Secondary | ICD-10-CM

## 2022-11-30 DIAGNOSIS — H43813 Vitreous degeneration, bilateral: Secondary | ICD-10-CM | POA: Diagnosis not present

## 2022-11-30 DIAGNOSIS — I10 Essential (primary) hypertension: Secondary | ICD-10-CM

## 2022-12-28 ENCOUNTER — Encounter: Payer: Self-pay | Admitting: Hematology and Oncology

## 2022-12-28 ENCOUNTER — Inpatient Hospital Stay: Payer: 59

## 2022-12-28 ENCOUNTER — Inpatient Hospital Stay: Payer: 59 | Attending: Hematology and Oncology

## 2022-12-28 ENCOUNTER — Encounter (INDEPENDENT_AMBULATORY_CARE_PROVIDER_SITE_OTHER): Payer: 59 | Admitting: Ophthalmology

## 2022-12-28 VITALS — BP 126/47 | HR 55 | Temp 97.7°F | Resp 16

## 2022-12-28 DIAGNOSIS — D539 Nutritional anemia, unspecified: Secondary | ICD-10-CM

## 2022-12-28 DIAGNOSIS — D631 Anemia in chronic kidney disease: Secondary | ICD-10-CM | POA: Diagnosis not present

## 2022-12-28 DIAGNOSIS — N1832 Chronic kidney disease, stage 3b: Secondary | ICD-10-CM | POA: Insufficient documentation

## 2022-12-28 LAB — CBC WITH DIFFERENTIAL/PLATELET
Abs Immature Granulocytes: 0.07 10*3/uL (ref 0.00–0.07)
Basophils Absolute: 0.1 10*3/uL (ref 0.0–0.1)
Basophils Relative: 1 %
Eosinophils Absolute: 0.1 10*3/uL (ref 0.0–0.5)
Eosinophils Relative: 1 %
HCT: 31.5 % — ABNORMAL LOW (ref 36.0–46.0)
Hemoglobin: 10.4 g/dL — ABNORMAL LOW (ref 12.0–15.0)
Immature Granulocytes: 1 %
Lymphocytes Relative: 27 %
Lymphs Abs: 2.3 10*3/uL (ref 0.7–4.0)
MCH: 29.7 pg (ref 26.0–34.0)
MCHC: 33 g/dL (ref 30.0–36.0)
MCV: 90 fL (ref 80.0–100.0)
Monocytes Absolute: 0.6 10*3/uL (ref 0.1–1.0)
Monocytes Relative: 7 %
Neutro Abs: 5.5 10*3/uL (ref 1.7–7.7)
Neutrophils Relative %: 63 %
Platelets: 206 10*3/uL (ref 150–400)
RBC: 3.5 MIL/uL — ABNORMAL LOW (ref 3.87–5.11)
RDW: 14 % (ref 11.5–15.5)
WBC: 8.6 10*3/uL (ref 4.0–10.5)
nRBC: 0 % (ref 0.0–0.2)

## 2022-12-28 MED ORDER — EPOETIN ALFA-EPBX 40000 UNIT/ML IJ SOLN
40000.0000 [IU] | Freq: Once | INTRAMUSCULAR | Status: AC
  Administered 2022-12-28: 40000 [IU] via SUBCUTANEOUS
  Filled 2022-12-28: qty 1

## 2022-12-29 ENCOUNTER — Encounter (INDEPENDENT_AMBULATORY_CARE_PROVIDER_SITE_OTHER): Payer: 59 | Admitting: Ophthalmology

## 2022-12-29 DIAGNOSIS — Z794 Long term (current) use of insulin: Secondary | ICD-10-CM

## 2022-12-29 DIAGNOSIS — E113591 Type 2 diabetes mellitus with proliferative diabetic retinopathy without macular edema, right eye: Secondary | ICD-10-CM | POA: Diagnosis not present

## 2022-12-29 DIAGNOSIS — H35033 Hypertensive retinopathy, bilateral: Secondary | ICD-10-CM | POA: Diagnosis not present

## 2022-12-29 DIAGNOSIS — I10 Essential (primary) hypertension: Secondary | ICD-10-CM

## 2022-12-29 DIAGNOSIS — H43813 Vitreous degeneration, bilateral: Secondary | ICD-10-CM | POA: Diagnosis not present

## 2022-12-29 DIAGNOSIS — E113512 Type 2 diabetes mellitus with proliferative diabetic retinopathy with macular edema, left eye: Secondary | ICD-10-CM

## 2022-12-31 ENCOUNTER — Emergency Department (HOSPITAL_COMMUNITY)
Admission: EM | Admit: 2022-12-31 | Discharge: 2022-12-31 | Disposition: A | Discharge status: Home / Self Care | Payer: 59 | Attending: Emergency Medicine | Admitting: Emergency Medicine

## 2022-12-31 ENCOUNTER — Encounter (HOSPITAL_COMMUNITY): Payer: Self-pay

## 2022-12-31 ENCOUNTER — Other Ambulatory Visit: Payer: Self-pay

## 2022-12-31 DIAGNOSIS — I10 Essential (primary) hypertension: Secondary | ICD-10-CM | POA: Insufficient documentation

## 2022-12-31 DIAGNOSIS — H5712 Ocular pain, left eye: Secondary | ICD-10-CM | POA: Diagnosis not present

## 2022-12-31 DIAGNOSIS — H43392 Other vitreous opacities, left eye: Secondary | ICD-10-CM | POA: Diagnosis not present

## 2022-12-31 DIAGNOSIS — Z79899 Other long term (current) drug therapy: Secondary | ICD-10-CM | POA: Insufficient documentation

## 2022-12-31 DIAGNOSIS — H53132 Sudden visual loss, left eye: Secondary | ICD-10-CM | POA: Diagnosis not present

## 2022-12-31 DIAGNOSIS — Z7982 Long term (current) use of aspirin: Secondary | ICD-10-CM | POA: Insufficient documentation

## 2022-12-31 DIAGNOSIS — Z7902 Long term (current) use of antithrombotics/antiplatelets: Secondary | ICD-10-CM | POA: Diagnosis not present

## 2022-12-31 DIAGNOSIS — E113513 Type 2 diabetes mellitus with proliferative diabetic retinopathy with macular edema, bilateral: Secondary | ICD-10-CM | POA: Diagnosis not present

## 2022-12-31 DIAGNOSIS — I2510 Atherosclerotic heart disease of native coronary artery without angina pectoris: Secondary | ICD-10-CM | POA: Diagnosis not present

## 2022-12-31 DIAGNOSIS — H5462 Unqualified visual loss, left eye, normal vision right eye: Secondary | ICD-10-CM | POA: Diagnosis not present

## 2022-12-31 DIAGNOSIS — Z7984 Long term (current) use of oral hypoglycemic drugs: Secondary | ICD-10-CM | POA: Diagnosis not present

## 2022-12-31 DIAGNOSIS — E119 Type 2 diabetes mellitus without complications: Secondary | ICD-10-CM | POA: Diagnosis not present

## 2022-12-31 DIAGNOSIS — H1132 Conjunctival hemorrhage, left eye: Secondary | ICD-10-CM | POA: Diagnosis not present

## 2022-12-31 DIAGNOSIS — Z961 Presence of intraocular lens: Secondary | ICD-10-CM | POA: Diagnosis not present

## 2022-12-31 MED ORDER — OXYCODONE-ACETAMINOPHEN 5-325 MG PO TABS: 1.0000 | ORAL_TABLET | ORAL | 0 refills | Status: DC | PRN

## 2022-12-31 NOTE — Discharge Instructions (Signed)
You are leaving AGAINST MEDICAL ADVICE before we were able to get in touch with ophthalmology to give [place] official's for your eye injury and pain with vision loss.  Please call to try to follow-up with them soon as possible.  Please use pain medicine help with discomfort now.

## 2022-12-31 NOTE — ED Provider Notes (Signed)
[place] EMERGENCY DEPARTMENT AT [facility] Provider Note   CSN: [id number] Arrival date & time: 12/31/22  1250     History  No chief complaint on file.   Dorlene L. Roskelley is a 77 y.o. female.  The history is provided by the patient, a relative and medical records. No language interpreter was used.  Eye Pain The current episode started more than 2 days ago. The problem occurs constantly. The problem has not changed since onset.Pertinent negatives include no chest pain, no abdominal pain, no headaches and no shortness of breath. Nothing aggravates the symptoms. Nothing relieves the symptoms. She has tried nothing for the symptoms. The treatment provided no relief.       Home Medications Prior to Admission medications   Medication Sig Start Date End Date Taking? Authorizing Provider  amLODipine (NORVASC) 5 MG tablet Take 1 tablet (5 mg total) by mouth every morning. 09/21/22   Sharlene Dory, PA-C  aspirin 81 MG chewable tablet Chew 81 mg by mouth daily.    [provider]  clopidogrel (PLAVIX) 75 MG tablet TAKE ONE TABLET BY MOUTH EVERY MORNING 09/21/22   Sharlene Dory, PA-C  famotidine (PEPCID) 20 MG tablet TAKE 1 TABLET(20 MG) BY MOUTH TWICE DAILY AS NEEDED FOR HEARTBURN OR INDIGESTION 09/02/20   Mirian Mo, MD  glipiZIDE (GLUCOTROL XL) 10 MG 24 hr tablet Take 10 mg by mouth daily. 03/29/22   [provider]  GLYXAMBI 25-5 MG TABS Take 1 tablet by mouth every morning. 06/28/22   [provider]  HYDROcodone-acetaminophen (NORCO) 5-325 MG tablet Take 1 tablet by mouth every 6 (six) hours as needed for moderate pain. 07/08/22   Ocie Doyne, DMD  isosorbide mononitrate (IMDUR) 60 MG 24 hr tablet TAKE ONE TABLET BY MOUTH ONCE DAILY 02/18/22   Lyn Records, MD  losartan-hydrochlorothiazide (HYZAAR) 50-12.5 MG tablet Take 1 tablet by mouth daily. 02/28/22   Lyn Records, MD  meclizine (ANTIVERT) 25 MG tablet SMARTSIG:1 Tablet(s) By Mouth Every  12 Hours PRN 08/30/21   [provider]  nitroGLYCERIN (NITROSTAT) 0.4 MG SL tablet Place 1 tablet (0.4 mg total) under the tongue every 5 (five) minutes as needed for chest pain. 09/21/22   Sharlene Dory, PA-C  polyethylene glycol powder (GLYCOLAX/MIRALAX) 17 GM/SCOOP powder Take 17 g by mouth daily. Patient taking differently: Take 17 g by mouth daily as needed for mild constipation or moderate constipation. 10/01/20   McDiarmid, Leighton Roach, MD  prednisoLONE acetate (PRED FORTE) 1 % ophthalmic suspension Place 1 drop into the left eye. 09/05/21   [provider]  PRODIGY NO CODING BLOOD GLUC test strip TESTING ONCE DAILY. 03/07/22   [provider]  Prodigy Twist Top Lancets 28G MISC TESTING ONCE DAILY. 03/06/22   [provider]  rosuvastatin (CRESTOR) 20 MG tablet TAKE ONE TABLET BY MOUTH EVERY MORNING Patient taking differently: Take 5 mg by mouth every morning. 01/19/22   Lyn Records, MD      Allergies    Metformin and related    Review of Systems   Review of Systems  Constitutional:  Negative for chills, fatigue and fever.  HENT:  Negative for congestion.   Eyes:  Positive for pain, redness and visual disturbance. Negative for photophobia.  Respiratory:  Negative for cough, chest tightness and shortness of breath.   Cardiovascular:  Negative for chest pain and palpitations.  Gastrointestinal:  Negative for abdominal pain, constipation, diarrhea, nausea and vomiting.  Genitourinary:  Negative for dysuria.  Musculoskeletal:  Negative for back pain and neck pain.  Skin:  Negative for rash and wound.  Neurological:  Negative for headaches.  Psychiatric/Behavioral:  Negative for agitation.   All other systems reviewed and are negative.   Physical Exam Updated Vital Signs BP (!) 181/64 (BP Location: Right Arm)   Pulse (!) 53   Temp 98.3 F (36.8 C) (Oral)   Resp 16   Ht 5\' 3"  (1.6 m)   Wt 60.9 kg   SpO2 100%   BMI 23.78 kg/m  Physical  Exam Vitals and nursing note reviewed.  Constitutional:      General: She is not in acute distress.    Appearance: She is well-developed.  HENT:     Head: Atraumatic.     Nose: No congestion or rhinorrhea.     Mouth/Throat:     Mouth: Mucous membranes are moist.  Eyes:     Extraocular Movements: Extraocular movements intact.     Conjunctiva/sclera:     Left eye: Hemorrhage present.     Comments: Patient has no vision out of her left eye.  Could not see bright light.  No consensual photophobia.  Pupil did not respond either to direct or consensual light.  Cardiovascular:     Rate and Rhythm: Normal rate and regular rhythm.     Heart sounds: No murmur heard. Pulmonary:     Effort: Pulmonary effort is normal. No respiratory distress.     Breath sounds: Normal breath sounds. No wheezing, rhonchi or rales.  Chest:     Chest wall: No tenderness.  Abdominal:     General: Abdomen is flat.     Palpations: Abdomen is soft.     Tenderness: There is no abdominal tenderness.  Musculoskeletal:        General: No swelling.     Cervical back: Neck supple. No tenderness.  Skin:    General: Skin is warm and dry.     Capillary Refill: Capillary refill takes less than 2 seconds.  Neurological:     Mental Status: She is alert.  Psychiatric:        Mood and Affect: Mood normal.     ED Results / Procedures / Treatments   Labs (all labs ordered are listed, but only abnormal results are displayed) Labs Reviewed - No data to display  EKG None  Radiology No results found.  Procedures Procedures    Medications Ordered in ED Medications - No data to display  ED Course/ Medical Decision Making/ A&P                                 Medical Decision Making Risk Prescription drug management.    Timisha L. Vickrey is a 77 y.o. female with a past medical history significant for hypertension, hyperlipidemia, diabetes, CAD, GERD, and left eye cataract getting chronic injections into her  left eye with ophthalmology who presents with worsening left eye pain and decreased vision.  According to patient, she was seen by Dr. Alan Mulder with ophthalmology on Thursday and had her scheduled eye injection to help with her cataract.  She reports that she normally has just a vague blurry film over the left eye but she can see fairly well.  She says that after the procedure, she started having pain in her left eye that is new.  She normally does not have a pain.  She reports there started  to show some conjunctival hemorrhage that gradually has encompassed all of the sclera around the left eye.  She is still able to move it but is still having severe pain in her left eye is now a 9 out of 10 in severity.  She reports she has no vision in the left eye when normally she can see.  She tried to call ophthalmology several times they report and has not been able to get in touch with anyone so she came here for evaluation.  Denies any other trauma.  Denies other headaches.  Denies any nausea, vomiting, constipation, diarrhea, or urinary changes.  She has never had this happen before.  She is on Plavix but is not on other anticoagulation.  On exam, the patient has had appears to be subconjunctival hemorrhage encasing the left eye.  I did not see evidence of hyphema but her pupil was about 3 mm and nonreactive.  There was no consensual photophobia but the left eye did not seem to contract with light in the right eye.  She is having  pain in the eye but with concern for perforation did not want to press on it or check pressure initially until speaking with ophthalmology.  Extraocular movements were intact however.  She did not respond to any light and could not see light coming out of left eye whatsoever.  Will call ophthalmology to discuss management.  5:21 PM Still have not yet heard from ophthalmology.  Patient reports she does not want to wait.  She would like to go home.  They requested I call them with the  ophthalmology recommendation so I will try to do that.  The daughter cell phone is [phone].  Will call in a prescription for some pain medicine and they need to get in touch with the ophthalmology team if we cannot hear from them today.  5:49 PM Spoke with ophthalmology after they were repaged.  They report that they did not get any initial pages and he called promptly after he got this new page.  He will call the daughter and family to discuss with them and make a plan from there.  She has already left here AMA.        Final Clinical Impression(s) / ED Diagnoses Final diagnoses:  Vision loss of left eye  Left eye pain    Clinical Impression: 1. Vision loss of left eye   2. Left eye pain     Disposition: Patient left AMA while we were waiting consult from ophthalmology to discuss a plan.  This note was prepared with assistance of Conservation officer, historic buildings. Occasional wrong-word or sound-a-like substitutions may have occurred due to the inherent limitations of voice recognition software.     Chestine Belknap, Canary Brim, MD 12/31/22 2022

## 2022-12-31 NOTE — ED Triage Notes (Signed)
Pt states unable to see out of left eye since Thursday. Pt got a injection in her left eye at the eye dr on Thursday and granddaughter states pt left eye has been having the subconjunctival hemorrhage worse. Pt's entire sclera has blood in it.

## 2023-01-13 ENCOUNTER — Encounter: Payer: Self-pay | Admitting: Hematology and Oncology

## 2023-01-25 ENCOUNTER — Other Ambulatory Visit: Payer: Self-pay | Admitting: *Deleted

## 2023-01-25 ENCOUNTER — Telehealth: Payer: Self-pay | Admitting: Cardiovascular Disease

## 2023-01-25 MED ORDER — LOSARTAN POTASSIUM-HCTZ 50-12.5 MG PO TABS: 1.0000 | ORAL_TABLET | Freq: Every day | ORAL | 1 refills | Status: DC

## 2023-01-25 MED ORDER — ISOSORBIDE MONONITRATE ER 60 MG PO TB24: 60.0000 mg | ORAL_TABLET | Freq: Every day | ORAL | 1 refills | Status: DC

## 2023-01-25 NOTE — Telephone Encounter (Signed)
*  STAT* If patient is at the pharmacy, call can be transferred to refill team.   1. Which medications need to be refilled? (please list name of each medication and dose if known) losartan-hydrochlorothiazide (HYZAAR) 50-12.5 MG tablet ; isosorbide mononitrate (IMDUR) 60 MG 24 hr tablet    2. Would you like to learn more about the convenience, safety, & potential cost savings by using the [facility]? No      3. Are you open to using the [facility] (Type [facility]. No ).   4. Which pharmacy/location (including street and city if local pharmacy) is medication to be sent to? [facility] - [address] [place] - [address]    5. Do they need a 30 day or 90 day supply? 90

## 2023-01-27 ENCOUNTER — Inpatient Hospital Stay: Payer: 59

## 2023-01-27 ENCOUNTER — Inpatient Hospital Stay: Payer: 59 | Attending: Hematology and Oncology

## 2023-01-27 DIAGNOSIS — D631 Anemia in chronic kidney disease: Secondary | ICD-10-CM | POA: Insufficient documentation

## 2023-01-27 DIAGNOSIS — N1832 Chronic kidney disease, stage 3b: Secondary | ICD-10-CM | POA: Diagnosis not present

## 2023-01-27 DIAGNOSIS — D539 Nutritional anemia, unspecified: Secondary | ICD-10-CM

## 2023-01-27 LAB — CBC WITH DIFFERENTIAL/PLATELET
Abs Immature Granulocytes: 0.02 10*3/uL (ref 0.00–0.07)
Basophils Absolute: 0 10*3/uL (ref 0.0–0.1)
Basophils Relative: 0 %
Eosinophils Absolute: 0.1 10*3/uL (ref 0.0–0.5)
Eosinophils Relative: 2 %
HCT: 36.1 % (ref 36.0–46.0)
Hemoglobin: 11.5 g/dL — ABNORMAL LOW (ref 12.0–15.0)
Immature Granulocytes: 0 %
Lymphocytes Relative: 22 %
Lymphs Abs: 1.7 10*3/uL (ref 0.7–4.0)
MCH: 28 pg (ref 26.0–34.0)
MCHC: 31.9 g/dL (ref 30.0–36.0)
MCV: 88 fL (ref 80.0–100.0)
Monocytes Absolute: 0.5 10*3/uL (ref 0.1–1.0)
Monocytes Relative: 6 %
Neutro Abs: 5.3 10*3/uL (ref 1.7–7.7)
Neutrophils Relative %: 70 %
Platelets: 216 10*3/uL (ref 150–400)
RBC: 4.1 MIL/uL (ref 3.87–5.11)
RDW: 13.5 % (ref 11.5–15.5)
WBC: 7.6 10*3/uL (ref 4.0–10.5)
nRBC: 0 % (ref 0.0–0.2)

## 2023-01-27 NOTE — Progress Notes (Signed)
HOLD epo injection today per parameters.

## 2023-02-02 ENCOUNTER — Encounter (INDEPENDENT_AMBULATORY_CARE_PROVIDER_SITE_OTHER): Payer: 59 | Admitting: Ophthalmology

## 2023-02-02 DIAGNOSIS — E113591 Type 2 diabetes mellitus with proliferative diabetic retinopathy without macular edema, right eye: Secondary | ICD-10-CM

## 2023-02-02 DIAGNOSIS — H35033 Hypertensive retinopathy, bilateral: Secondary | ICD-10-CM

## 2023-02-02 DIAGNOSIS — I10 Essential (primary) hypertension: Secondary | ICD-10-CM | POA: Diagnosis not present

## 2023-02-02 DIAGNOSIS — E113512 Type 2 diabetes mellitus with proliferative diabetic retinopathy with macular edema, left eye: Secondary | ICD-10-CM | POA: Diagnosis not present

## 2023-02-02 DIAGNOSIS — H43813 Vitreous degeneration, bilateral: Secondary | ICD-10-CM

## 2023-02-02 DIAGNOSIS — Z794 Long term (current) use of insulin: Secondary | ICD-10-CM | POA: Diagnosis not present

## 2023-02-27 ENCOUNTER — Encounter: Payer: Self-pay | Admitting: Hematology and Oncology

## 2023-02-27 ENCOUNTER — Inpatient Hospital Stay: Payer: 59 | Attending: Hematology and Oncology

## 2023-02-27 ENCOUNTER — Inpatient Hospital Stay: Payer: 59

## 2023-02-27 ENCOUNTER — Other Ambulatory Visit: Payer: Self-pay

## 2023-02-27 VITALS — BP 142/46 | HR 54 | Temp 98.1°F | Resp 18

## 2023-02-27 DIAGNOSIS — N1832 Chronic kidney disease, stage 3b: Secondary | ICD-10-CM | POA: Insufficient documentation

## 2023-02-27 DIAGNOSIS — D539 Nutritional anemia, unspecified: Secondary | ICD-10-CM

## 2023-02-27 DIAGNOSIS — D631 Anemia in chronic kidney disease: Secondary | ICD-10-CM | POA: Insufficient documentation

## 2023-02-27 LAB — CBC WITH DIFFERENTIAL/PLATELET
Abs Immature Granulocytes: 0.03 10*3/uL (ref 0.00–0.07)
Basophils Absolute: 0.1 10*3/uL (ref 0.0–0.1)
Basophils Relative: 1 %
Eosinophils Absolute: 0.1 10*3/uL (ref 0.0–0.5)
Eosinophils Relative: 1 %
HCT: 33.5 % — ABNORMAL LOW (ref 36.0–46.0)
Hemoglobin: 10.6 g/dL — ABNORMAL LOW (ref 12.0–15.0)
Immature Granulocytes: 0 %
Lymphocytes Relative: 24 %
Lymphs Abs: 1.9 10*3/uL (ref 0.7–4.0)
MCH: 28.6 pg (ref 26.0–34.0)
MCHC: 31.6 g/dL (ref 30.0–36.0)
MCV: 90.3 fL (ref 80.0–100.0)
Monocytes Absolute: 0.5 10*3/uL (ref 0.1–1.0)
Monocytes Relative: 6 %
Neutro Abs: 5.5 10*3/uL (ref 1.7–7.7)
Neutrophils Relative %: 68 %
Platelets: 206 10*3/uL (ref 150–400)
RBC: 3.71 MIL/uL — ABNORMAL LOW (ref 3.87–5.11)
RDW: 14 % (ref 11.5–15.5)
WBC: 8.1 10*3/uL (ref 4.0–10.5)
nRBC: 0 % (ref 0.0–0.2)

## 2023-02-27 MED ORDER — EPOETIN ALFA-EPBX 40000 UNIT/ML IJ SOLN
40000.0000 [IU] | Freq: Once | INTRAMUSCULAR | Status: AC
  Administered 2023-02-27: 40000 [IU] via SUBCUTANEOUS
  Filled 2023-02-27: qty 1

## 2023-02-28 DIAGNOSIS — E1122 Type 2 diabetes mellitus with diabetic chronic kidney disease: Secondary | ICD-10-CM | POA: Diagnosis not present

## 2023-02-28 DIAGNOSIS — Z0000 Encounter for general adult medical examination without abnormal findings: Secondary | ICD-10-CM | POA: Diagnosis not present

## 2023-02-28 DIAGNOSIS — N184 Chronic kidney disease, stage 4 (severe): Secondary | ICD-10-CM | POA: Diagnosis not present

## 2023-02-28 DIAGNOSIS — I10 Essential (primary) hypertension: Secondary | ICD-10-CM | POA: Diagnosis not present

## 2023-02-28 DIAGNOSIS — I739 Peripheral vascular disease, unspecified: Secondary | ICD-10-CM | POA: Diagnosis not present

## 2023-02-28 DIAGNOSIS — Z23 Encounter for immunization: Secondary | ICD-10-CM | POA: Diagnosis not present

## 2023-02-28 DIAGNOSIS — E7800 Pure hypercholesterolemia, unspecified: Secondary | ICD-10-CM | POA: Diagnosis not present

## 2023-03-02 ENCOUNTER — Encounter (INDEPENDENT_AMBULATORY_CARE_PROVIDER_SITE_OTHER): Payer: 59 | Admitting: Ophthalmology

## 2023-03-06 ENCOUNTER — Encounter (INDEPENDENT_AMBULATORY_CARE_PROVIDER_SITE_OTHER): Payer: 59 | Admitting: Ophthalmology

## 2023-03-06 DIAGNOSIS — H43813 Vitreous degeneration, bilateral: Secondary | ICD-10-CM

## 2023-03-06 DIAGNOSIS — Z794 Long term (current) use of insulin: Secondary | ICD-10-CM | POA: Diagnosis not present

## 2023-03-06 DIAGNOSIS — E113591 Type 2 diabetes mellitus with proliferative diabetic retinopathy without macular edema, right eye: Secondary | ICD-10-CM

## 2023-03-06 DIAGNOSIS — H35033 Hypertensive retinopathy, bilateral: Secondary | ICD-10-CM

## 2023-03-06 DIAGNOSIS — I10 Essential (primary) hypertension: Secondary | ICD-10-CM | POA: Diagnosis not present

## 2023-03-06 DIAGNOSIS — E113512 Type 2 diabetes mellitus with proliferative diabetic retinopathy with macular edema, left eye: Secondary | ICD-10-CM | POA: Diagnosis not present

## 2023-03-29 ENCOUNTER — Inpatient Hospital Stay: Payer: 59

## 2023-03-29 ENCOUNTER — Inpatient Hospital Stay: Payer: 59 | Attending: Hematology and Oncology

## 2023-03-29 VITALS — BP 129/50 | HR 50 | Temp 97.8°F | Resp 16

## 2023-03-29 DIAGNOSIS — D539 Nutritional anemia, unspecified: Secondary | ICD-10-CM

## 2023-03-29 DIAGNOSIS — D631 Anemia in chronic kidney disease: Secondary | ICD-10-CM | POA: Insufficient documentation

## 2023-03-29 DIAGNOSIS — N1832 Chronic kidney disease, stage 3b: Secondary | ICD-10-CM | POA: Diagnosis not present

## 2023-03-29 LAB — CBC WITH DIFFERENTIAL/PLATELET
Abs Immature Granulocytes: 0.03 10*3/uL (ref 0.00–0.07)
Basophils Absolute: 0 10*3/uL (ref 0.0–0.1)
Basophils Relative: 1 %
Eosinophils Absolute: 0.1 10*3/uL (ref 0.0–0.5)
Eosinophils Relative: 2 %
HCT: 33.7 % — ABNORMAL LOW (ref 36.0–46.0)
Hemoglobin: 10.8 g/dL — ABNORMAL LOW (ref 12.0–15.0)
Immature Granulocytes: 0 %
Lymphocytes Relative: 27 %
Lymphs Abs: 2 10*3/uL (ref 0.7–4.0)
MCH: 28.2 pg (ref 26.0–34.0)
MCHC: 32 g/dL (ref 30.0–36.0)
MCV: 88 fL (ref 80.0–100.0)
Monocytes Absolute: 0.5 10*3/uL (ref 0.1–1.0)
Monocytes Relative: 7 %
Neutro Abs: 4.5 10*3/uL (ref 1.7–7.7)
Neutrophils Relative %: 63 %
Platelets: 211 10*3/uL (ref 150–400)
RBC: 3.83 MIL/uL — ABNORMAL LOW (ref 3.87–5.11)
RDW: 14 % (ref 11.5–15.5)
WBC: 7.2 10*3/uL (ref 4.0–10.5)
nRBC: 0 % (ref 0.0–0.2)

## 2023-03-29 MED ORDER — EPOETIN ALFA-EPBX 40000 UNIT/ML IJ SOLN
40000.0000 [IU] | Freq: Once | INTRAMUSCULAR | Status: AC
Start: 2023-03-29 — End: 2023-03-29
  Administered 2023-03-29: 40000 [IU] via SUBCUTANEOUS
  Filled 2023-03-29: qty 1

## 2023-04-03 ENCOUNTER — Encounter (INDEPENDENT_AMBULATORY_CARE_PROVIDER_SITE_OTHER): Payer: 59 | Admitting: Ophthalmology

## 2023-04-03 DIAGNOSIS — H35033 Hypertensive retinopathy, bilateral: Secondary | ICD-10-CM | POA: Diagnosis not present

## 2023-04-03 DIAGNOSIS — Z794 Long term (current) use of insulin: Secondary | ICD-10-CM | POA: Diagnosis not present

## 2023-04-03 DIAGNOSIS — H43813 Vitreous degeneration, bilateral: Secondary | ICD-10-CM

## 2023-04-03 DIAGNOSIS — E113591 Type 2 diabetes mellitus with proliferative diabetic retinopathy without macular edema, right eye: Secondary | ICD-10-CM | POA: Diagnosis not present

## 2023-04-03 DIAGNOSIS — I10 Essential (primary) hypertension: Secondary | ICD-10-CM | POA: Diagnosis not present

## 2023-04-03 DIAGNOSIS — E113512 Type 2 diabetes mellitus with proliferative diabetic retinopathy with macular edema, left eye: Secondary | ICD-10-CM

## 2023-04-07 DIAGNOSIS — I129 Hypertensive chronic kidney disease with stage 1 through stage 4 chronic kidney disease, or unspecified chronic kidney disease: Secondary | ICD-10-CM | POA: Diagnosis not present

## 2023-04-07 DIAGNOSIS — E559 Vitamin D deficiency, unspecified: Secondary | ICD-10-CM | POA: Diagnosis not present

## 2023-04-07 DIAGNOSIS — N1832 Chronic kidney disease, stage 3b: Secondary | ICD-10-CM | POA: Diagnosis not present

## 2023-04-07 DIAGNOSIS — E1122 Type 2 diabetes mellitus with diabetic chronic kidney disease: Secondary | ICD-10-CM | POA: Diagnosis not present

## 2023-04-07 DIAGNOSIS — D631 Anemia in chronic kidney disease: Secondary | ICD-10-CM | POA: Diagnosis not present

## 2023-04-07 DIAGNOSIS — E1129 Type 2 diabetes mellitus with other diabetic kidney complication: Secondary | ICD-10-CM | POA: Diagnosis not present

## 2023-04-07 DIAGNOSIS — N189 Chronic kidney disease, unspecified: Secondary | ICD-10-CM | POA: Diagnosis not present

## 2023-04-28 ENCOUNTER — Encounter: Payer: Self-pay | Admitting: Hematology and Oncology

## 2023-04-28 ENCOUNTER — Inpatient Hospital Stay: Payer: 59

## 2023-04-28 ENCOUNTER — Inpatient Hospital Stay: Payer: 59 | Attending: Hematology and Oncology

## 2023-04-28 VITALS — BP 140/57 | HR 53 | Temp 98.0°F | Resp 18

## 2023-04-28 DIAGNOSIS — D631 Anemia in chronic kidney disease: Secondary | ICD-10-CM | POA: Insufficient documentation

## 2023-04-28 DIAGNOSIS — N1832 Chronic kidney disease, stage 3b: Secondary | ICD-10-CM | POA: Diagnosis not present

## 2023-04-28 DIAGNOSIS — D539 Nutritional anemia, unspecified: Secondary | ICD-10-CM

## 2023-04-28 LAB — CBC WITH DIFFERENTIAL/PLATELET
Abs Immature Granulocytes: 0.03 10*3/uL (ref 0.00–0.07)
Basophils Absolute: 0.1 10*3/uL (ref 0.0–0.1)
Basophils Relative: 1 %
Eosinophils Absolute: 0.1 10*3/uL (ref 0.0–0.5)
Eosinophils Relative: 2 %
HCT: 34.6 % — ABNORMAL LOW (ref 36.0–46.0)
Hemoglobin: 11.1 g/dL — ABNORMAL LOW (ref 12.0–15.0)
Immature Granulocytes: 0 %
Lymphocytes Relative: 31 %
Lymphs Abs: 2.3 10*3/uL (ref 0.7–4.0)
MCH: 29.1 pg (ref 26.0–34.0)
MCHC: 32.1 g/dL (ref 30.0–36.0)
MCV: 90.6 fL (ref 80.0–100.0)
Monocytes Absolute: 0.5 10*3/uL (ref 0.1–1.0)
Monocytes Relative: 7 %
Neutro Abs: 4.5 10*3/uL (ref 1.7–7.7)
Neutrophils Relative %: 59 %
Platelets: 190 10*3/uL (ref 150–400)
RBC: 3.82 MIL/uL — ABNORMAL LOW (ref 3.87–5.11)
RDW: 13.9 % (ref 11.5–15.5)
Smear Review: NORMAL
WBC: 7.6 10*3/uL (ref 4.0–10.5)
nRBC: 0 % (ref 0.0–0.2)

## 2023-04-28 MED ORDER — EPOETIN ALFA-EPBX 40000 UNIT/ML IJ SOLN: 40000.0000 [IU] | Freq: Once | INTRAMUSCULAR | Status: DC

## 2023-04-28 NOTE — Progress Notes (Signed)
Pt. Hgb was recorded as 11.1. Per pharmacy and perimeters injection was held. Provider notified.

## 2023-05-01 ENCOUNTER — Encounter (INDEPENDENT_AMBULATORY_CARE_PROVIDER_SITE_OTHER): Payer: 59 | Admitting: Ophthalmology

## 2023-05-01 DIAGNOSIS — I10 Essential (primary) hypertension: Secondary | ICD-10-CM

## 2023-05-01 DIAGNOSIS — Z794 Long term (current) use of insulin: Secondary | ICD-10-CM

## 2023-05-01 DIAGNOSIS — H43813 Vitreous degeneration, bilateral: Secondary | ICD-10-CM | POA: Diagnosis not present

## 2023-05-01 DIAGNOSIS — H35033 Hypertensive retinopathy, bilateral: Secondary | ICD-10-CM | POA: Diagnosis not present

## 2023-05-01 DIAGNOSIS — E113512 Type 2 diabetes mellitus with proliferative diabetic retinopathy with macular edema, left eye: Secondary | ICD-10-CM | POA: Diagnosis not present

## 2023-05-01 DIAGNOSIS — E113591 Type 2 diabetes mellitus with proliferative diabetic retinopathy without macular edema, right eye: Secondary | ICD-10-CM

## 2023-05-05 ENCOUNTER — Other Ambulatory Visit: Payer: Self-pay | Admitting: Physician Assistant

## 2023-05-05 DIAGNOSIS — R079 Chest pain, unspecified: Secondary | ICD-10-CM

## 2023-05-08 ENCOUNTER — Other Ambulatory Visit: Payer: Self-pay

## 2023-05-23 ENCOUNTER — Other Ambulatory Visit: Payer: Self-pay | Admitting: Family Medicine

## 2023-05-23 DIAGNOSIS — W19XXXA Unspecified fall, initial encounter: Secondary | ICD-10-CM

## 2023-05-23 DIAGNOSIS — N184 Chronic kidney disease, stage 4 (severe): Secondary | ICD-10-CM | POA: Diagnosis not present

## 2023-05-23 DIAGNOSIS — E113591 Type 2 diabetes mellitus with proliferative diabetic retinopathy without macular edema, right eye: Secondary | ICD-10-CM | POA: Diagnosis not present

## 2023-05-23 DIAGNOSIS — D631 Anemia in chronic kidney disease: Secondary | ICD-10-CM | POA: Diagnosis not present

## 2023-05-23 DIAGNOSIS — N1832 Chronic kidney disease, stage 3b: Secondary | ICD-10-CM | POA: Diagnosis not present

## 2023-05-23 DIAGNOSIS — R413 Other amnesia: Secondary | ICD-10-CM | POA: Diagnosis not present

## 2023-05-23 DIAGNOSIS — R296 Repeated falls: Secondary | ICD-10-CM | POA: Diagnosis not present

## 2023-05-24 ENCOUNTER — Ambulatory Visit
Admission: RE | Admit: 2023-05-24 | Discharge: 2023-05-24 | Disposition: A | Discharge status: Home / Self Care | Payer: 59 | Source: Ambulatory Visit | Attending: Family Medicine | Admitting: Family Medicine

## 2023-05-24 DIAGNOSIS — S0990XA Unspecified injury of head, initial encounter: Secondary | ICD-10-CM | POA: Diagnosis not present

## 2023-05-24 DIAGNOSIS — W19XXXA Unspecified fall, initial encounter: Secondary | ICD-10-CM

## 2023-05-29 ENCOUNTER — Encounter (INDEPENDENT_AMBULATORY_CARE_PROVIDER_SITE_OTHER): Payer: 59 | Admitting: Ophthalmology

## 2023-05-29 DIAGNOSIS — Z794 Long term (current) use of insulin: Secondary | ICD-10-CM

## 2023-05-29 DIAGNOSIS — E113591 Type 2 diabetes mellitus with proliferative diabetic retinopathy without macular edema, right eye: Secondary | ICD-10-CM

## 2023-05-29 DIAGNOSIS — H43813 Vitreous degeneration, bilateral: Secondary | ICD-10-CM

## 2023-05-29 DIAGNOSIS — H35033 Hypertensive retinopathy, bilateral: Secondary | ICD-10-CM

## 2023-05-29 DIAGNOSIS — E113512 Type 2 diabetes mellitus with proliferative diabetic retinopathy with macular edema, left eye: Secondary | ICD-10-CM | POA: Diagnosis not present

## 2023-05-29 DIAGNOSIS — I10 Essential (primary) hypertension: Secondary | ICD-10-CM | POA: Diagnosis not present

## 2023-05-30 ENCOUNTER — Inpatient Hospital Stay: Payer: 59 | Attending: Hematology and Oncology

## 2023-05-30 ENCOUNTER — Inpatient Hospital Stay: Payer: 59

## 2023-05-30 VITALS — BP 148/44 | HR 50 | Temp 98.1°F | Resp 18

## 2023-05-30 DIAGNOSIS — D631 Anemia in chronic kidney disease: Secondary | ICD-10-CM | POA: Insufficient documentation

## 2023-05-30 DIAGNOSIS — N1832 Chronic kidney disease, stage 3b: Secondary | ICD-10-CM

## 2023-05-30 DIAGNOSIS — D539 Nutritional anemia, unspecified: Secondary | ICD-10-CM

## 2023-05-30 LAB — CBC WITH DIFFERENTIAL/PLATELET
Abs Immature Granulocytes: 0.04 10*3/uL (ref 0.00–0.07)
Basophils Absolute: 0 10*3/uL (ref 0.0–0.1)
Basophils Relative: 0 %
Eosinophils Absolute: 0.1 10*3/uL (ref 0.0–0.5)
Eosinophils Relative: 1 %
HCT: 31.8 % — ABNORMAL LOW (ref 36.0–46.0)
Hemoglobin: 10.2 g/dL — ABNORMAL LOW (ref 12.0–15.0)
Immature Granulocytes: 1 %
Lymphocytes Relative: 29 %
Lymphs Abs: 2.2 10*3/uL (ref 0.7–4.0)
MCH: 29.3 pg (ref 26.0–34.0)
MCHC: 32.1 g/dL (ref 30.0–36.0)
MCV: 91.4 fL (ref 80.0–100.0)
Monocytes Absolute: 0.5 10*3/uL (ref 0.1–1.0)
Monocytes Relative: 7 %
Neutro Abs: 4.6 10*3/uL (ref 1.7–7.7)
Neutrophils Relative %: 62 %
Platelets: 216 10*3/uL (ref 150–400)
RBC: 3.48 MIL/uL — ABNORMAL LOW (ref 3.87–5.11)
RDW: 13.9 % (ref 11.5–15.5)
WBC: 7.5 10*3/uL (ref 4.0–10.5)
nRBC: 0 % (ref 0.0–0.2)

## 2023-05-30 MED ORDER — EPOETIN ALFA-EPBX 40000 UNIT/ML IJ SOLN
40000.0000 [IU] | Freq: Once | INTRAMUSCULAR | Status: AC
  Administered 2023-05-30: 40000 [IU] via SUBCUTANEOUS
  Filled 2023-05-30: qty 1

## 2023-06-05 ENCOUNTER — Other Ambulatory Visit: Payer: Self-pay | Admitting: Physician Assistant

## 2023-06-05 DIAGNOSIS — R079 Chest pain, unspecified: Secondary | ICD-10-CM

## 2023-06-19 ENCOUNTER — Other Ambulatory Visit: Payer: Self-pay | Admitting: Physician Assistant

## 2023-06-19 DIAGNOSIS — R079 Chest pain, unspecified: Secondary | ICD-10-CM

## 2023-06-26 ENCOUNTER — Encounter (INDEPENDENT_AMBULATORY_CARE_PROVIDER_SITE_OTHER): Payer: 59 | Admitting: Ophthalmology

## 2023-06-26 DIAGNOSIS — Z794 Long term (current) use of insulin: Secondary | ICD-10-CM

## 2023-06-26 DIAGNOSIS — H35033 Hypertensive retinopathy, bilateral: Secondary | ICD-10-CM

## 2023-06-26 DIAGNOSIS — H35372 Puckering of macula, left eye: Secondary | ICD-10-CM

## 2023-06-26 DIAGNOSIS — H43813 Vitreous degeneration, bilateral: Secondary | ICD-10-CM | POA: Diagnosis not present

## 2023-06-26 DIAGNOSIS — E113512 Type 2 diabetes mellitus with proliferative diabetic retinopathy with macular edema, left eye: Secondary | ICD-10-CM

## 2023-06-26 DIAGNOSIS — I10 Essential (primary) hypertension: Secondary | ICD-10-CM

## 2023-06-26 DIAGNOSIS — E113591 Type 2 diabetes mellitus with proliferative diabetic retinopathy without macular edema, right eye: Secondary | ICD-10-CM

## 2023-06-27 ENCOUNTER — Inpatient Hospital Stay: Payer: 59

## 2023-06-27 ENCOUNTER — Inpatient Hospital Stay: Payer: 59 | Attending: Hematology and Oncology

## 2023-06-27 ENCOUNTER — Inpatient Hospital Stay (HOSPITAL_BASED_OUTPATIENT_CLINIC_OR_DEPARTMENT_OTHER): Payer: 59 | Admitting: Hematology and Oncology

## 2023-06-27 VITALS — BP 149/54 | HR 58 | Temp 98.2°F | Resp 18 | Ht 63.0 in | Wt 128.8 lb

## 2023-06-27 DIAGNOSIS — I129 Hypertensive chronic kidney disease with stage 1 through stage 4 chronic kidney disease, or unspecified chronic kidney disease: Secondary | ICD-10-CM | POA: Diagnosis not present

## 2023-06-27 DIAGNOSIS — D539 Nutritional anemia, unspecified: Secondary | ICD-10-CM

## 2023-06-27 DIAGNOSIS — N1832 Chronic kidney disease, stage 3b: Secondary | ICD-10-CM | POA: Insufficient documentation

## 2023-06-27 DIAGNOSIS — D631 Anemia in chronic kidney disease: Secondary | ICD-10-CM | POA: Diagnosis not present

## 2023-06-27 LAB — CBC WITH DIFFERENTIAL/PLATELET
Abs Immature Granulocytes: 0.06 10*3/uL (ref 0.00–0.07)
Basophils Absolute: 0.1 10*3/uL (ref 0.0–0.1)
Basophils Relative: 0 %
Eosinophils Absolute: 0.1 10*3/uL (ref 0.0–0.5)
Eosinophils Relative: 1 %
HCT: 36.4 % (ref 36.0–46.0)
Hemoglobin: 11.5 g/dL — ABNORMAL LOW (ref 12.0–15.0)
Immature Granulocytes: 1 %
Lymphocytes Relative: 15 %
Lymphs Abs: 1.8 10*3/uL (ref 0.7–4.0)
MCH: 29.5 pg (ref 26.0–34.0)
MCHC: 31.6 g/dL (ref 30.0–36.0)
MCV: 93.3 fL (ref 80.0–100.0)
Monocytes Absolute: 0.8 10*3/uL (ref 0.1–1.0)
Monocytes Relative: 7 %
Neutro Abs: 8.7 10*3/uL — ABNORMAL HIGH (ref 1.7–7.7)
Neutrophils Relative %: 76 %
Platelets: 174 10*3/uL (ref 150–400)
RBC: 3.9 MIL/uL (ref 3.87–5.11)
RDW: 13.5 % (ref 11.5–15.5)
WBC: 11.5 10*3/uL — ABNORMAL HIGH (ref 4.0–10.5)
nRBC: 0 % (ref 0.0–0.2)

## 2023-06-27 NOTE — Assessment & Plan Note (Addendum)
 Overall, she has anemia of chronic renal failure With increased dose of ESA to 40,000 units, she has skip several doses  Her hemoglobin is greater than 11 She will skip injection today She has skipped several injection over the past 12 months Based on the trend of her recent blood work, I plan to reduce the dose to 20,000 units once a month I plan to repeat iron studies, B12, folate and erythropoietin level again next month I plan to see her again in 6 months for further follow-up

## 2023-06-27 NOTE — Progress Notes (Signed)
 [facility] OFFICE PROGRESS NOTE  Aliene Beams, MD  ASSESSMENT & PLAN:  Assessment & Plan Deficiency anemia Overall, she has anemia of chronic renal failure With increased dose of ESA to 40,000 units, she has skip several doses  Her hemoglobin is greater than 11 She will skip injection today She has skipped several injection over the past 12 months Based on the trend of her recent blood work, I plan to reduce the dose to 20,000 units once a month I plan to repeat iron studies, B12, folate and erythropoietin level again next month I plan to see her again in 6 months for further follow-up    Orders Placed This Encounter  Procedures   Iron and Iron Binding Capacity ([facility] only)    Standing Status:   Future    Expected Date:   07/28/2023    Expiration Date:   06/26/2024   Erythropoietin    Standing Status:   Future    Expected Date:   07/28/2023    Expiration Date:   06/26/2024   Ferritin    Standing Status:   Future    Expected Date:   07/28/2023    Expiration Date:   06/26/2024   Basic Metabolic Panel - Cancer Center Only    Standing Status:   Future    Expected Date:   07/28/2023    Expiration Date:   06/26/2024   Vitamin B12    Standing Status:   Future    Expected Date:   07/28/2023    Expiration Date:   06/26/2024   Folate RBC    Standing Status:   Future    Expected Date:   07/28/2023    Expiration Date:   06/26/2024   CBC with Differential/Platelet    Standing Status:   Standing    Number of Occurrences:   22    Expiration Date:   06/26/2024    INTERVAL HISTORY: Patient returns for recurrent anemia Symptoms of anemia includes fatigue We reviewed CBC results  SUMMARY OF HEMATOLOGIC HISTORY:  The patient was seen by multiple different physicians in the past She was originally seen by Dr. Arline Asp, and then Dr. Rosie Fate, and last seen here in 2016 by Dr. Truett Perna She has remote history of colon cancer diagnosed in 2006, stage II (T3 N0), status post  a right colectomy 03/07/2005. She received adjuvant FOLFOX for 4 cycles followed by 8 cycles of 5-FU/leucovorin.  She had imaging study with CT scan of the abdomen and pelvis in 2019 which show no evidence of recurrence.  Her last colonoscopy was on July 14, 2016 which show no evidence of disease.  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  No other abnormalities were noted She also have history of meningioma and follow with neurosurgery for periodic imaging study.  Her last MRI of the brain was in Aug 11, 2015 which show stable meningioma  She was found to have abnormal CBC from recent blood work.  On review of her blood work, it is noted that she has progressive anemia over the last few years, low was around 8.8 In July of this year, she had iron studies which showed normal iron storage.  She is noted to have progressive renal disease. Starting around 2018, she had progressive renal dysfunction, with serum creatinine fluctuate usually under 2 but in January of this year, her creatinine went up to as high as  2.2. Estimated EGFR put her at chronic kidney disease stage III.  She has multiple cardiovascular risk factors including poorly controlled diabetes, peripheral vascular disease, chronic kidney disease, coronary artery disease and hypertension  She denies recent chest pain on exertion, shortness of breath on minimal exertion, pre-syncopal episodes, or palpitations. She had not noticed any recent bleeding such as epistaxis, hematuria or hematochezia The patient denies over the counter NSAID ingestion. She is on antiplatelets agents.  She denies any pica and eats a variety of diet. She had received blood transfusion when she was diagnosed with colon cancer Her granddaughter noticed recurrent falls.  She denies generalized weakness.  She denies peripheral neuropathy from prior treatment or diabetes She is started on Epoetin  injection for anemia chronic kidney disease on February 10, 2020   Vitals:   06/27/23 1204  BP: (!) 149/54  Pulse: (!) 58  Resp: 18  Temp: 98.2 F (36.8 C)  SpO2: 100%

## 2023-07-04 ENCOUNTER — Other Ambulatory Visit: Payer: Self-pay | Admitting: Cardiovascular Disease

## 2023-07-04 ENCOUNTER — Other Ambulatory Visit: Payer: Self-pay | Admitting: Physician Assistant

## 2023-07-06 NOTE — Telephone Encounter (Signed)
 This pt of Dr. Allyson Sabal is passed her 3rd attempt. Does Dr. Allyson Sabal want to refill? Please advise

## 2023-07-06 NOTE — Telephone Encounter (Signed)
 Same Pt of Dr. Allyson Sabal. Asking for another refill.

## 2023-07-28 ENCOUNTER — Inpatient Hospital Stay: Payer: 59

## 2023-07-28 ENCOUNTER — Inpatient Hospital Stay: Payer: 59 | Admitting: Hematology and Oncology

## 2023-07-28 ENCOUNTER — Inpatient Hospital Stay: Payer: 59 | Attending: Hematology and Oncology

## 2023-07-28 VITALS — BP 130/54 | HR 54 | Temp 97.8°F | Resp 16

## 2023-07-28 DIAGNOSIS — D631 Anemia in chronic kidney disease: Secondary | ICD-10-CM | POA: Insufficient documentation

## 2023-07-28 DIAGNOSIS — D539 Nutritional anemia, unspecified: Secondary | ICD-10-CM

## 2023-07-28 DIAGNOSIS — N1832 Chronic kidney disease, stage 3b: Secondary | ICD-10-CM | POA: Insufficient documentation

## 2023-07-28 LAB — VITAMIN B12: Vitamin B-12: 265 pg/mL (ref 180–914)

## 2023-07-28 LAB — CBC WITH DIFFERENTIAL/PLATELET
Abs Immature Granulocytes: 0.02 10*3/uL (ref 0.00–0.07)
Basophils Absolute: 0 10*3/uL (ref 0.0–0.1)
Basophils Relative: 0 %
Eosinophils Absolute: 0.1 10*3/uL (ref 0.0–0.5)
Eosinophils Relative: 2 %
HCT: 32.1 % — ABNORMAL LOW (ref 36.0–46.0)
Hemoglobin: 10.7 g/dL — ABNORMAL LOW (ref 12.0–15.0)
Immature Granulocytes: 0 %
Lymphocytes Relative: 23 %
Lymphs Abs: 1.8 10*3/uL (ref 0.7–4.0)
MCH: 29.6 pg (ref 26.0–34.0)
MCHC: 33.3 g/dL (ref 30.0–36.0)
MCV: 88.7 fL (ref 80.0–100.0)
Monocytes Absolute: 0.5 10*3/uL (ref 0.1–1.0)
Monocytes Relative: 6 %
Neutro Abs: 5.3 10*3/uL (ref 1.7–7.7)
Neutrophils Relative %: 69 %
Platelets: 178 10*3/uL (ref 150–400)
RBC: 3.62 MIL/uL — ABNORMAL LOW (ref 3.87–5.11)
RDW: 13.3 % (ref 11.5–15.5)
WBC: 7.7 10*3/uL (ref 4.0–10.5)
nRBC: 0 % (ref 0.0–0.2)

## 2023-07-28 LAB — BASIC METABOLIC PANEL - CANCER CENTER ONLY
Anion gap: 8 (ref 5–15)
BUN: 35 mg/dL — ABNORMAL HIGH (ref 8–23)
CO2: 26 mmol/L (ref 22–32)
Calcium: 10 mg/dL (ref 8.9–10.3)
Chloride: 107 mmol/L (ref 98–111)
Creatinine: 1.59 mg/dL — ABNORMAL HIGH (ref 0.44–1.00)
GFR, Estimated: 33 mL/min — ABNORMAL LOW (ref >60)
Glucose, Bld: 155 mg/dL — ABNORMAL HIGH (ref 70–99)
Potassium: 3.3 mmol/L — ABNORMAL LOW (ref 3.5–5.1)
Sodium: 141 mmol/L (ref 135–145)

## 2023-07-28 LAB — IRON AND IRON BINDING CAPACITY (CC-WL,HP ONLY)
Iron: 71 ug/dL (ref 28–170)
Saturation Ratios: 21 % (ref 10.4–31.8)
TIBC: 333 ug/dL (ref 250–450)
UIBC: 262 ug/dL (ref 148–442)

## 2023-07-28 LAB — FERRITIN: Ferritin: 75 ng/mL (ref 11–307)

## 2023-07-28 MED ORDER — EPOETIN ALFA-EPBX 10000 UNIT/ML IJ SOLN
20000.0000 [IU] | Freq: Once | INTRAMUSCULAR | Status: AC
  Administered 2023-07-28: 20000 [IU] via SUBCUTANEOUS

## 2023-07-28 MED ORDER — EPOETIN ALFA-EPBX 20000 UNIT/ML IJ SOLN
20000.0000 [IU] | Freq: Once | INTRAMUSCULAR | Status: DC
Start: 2023-07-28 — End: 2023-07-28

## 2023-07-28 MED ORDER — EPOETIN ALFA-EPBX 10000 UNIT/ML IJ SOLN: 20000.0000 [IU] | Freq: Once | INTRAMUSCULAR | Status: DC

## 2023-07-29 LAB — ERYTHROPOIETIN: Erythropoietin: 10.2 m[IU]/mL (ref 2.6–18.5)

## 2023-07-31 LAB — FOLATE RBC
Folate, Hemolysate: 333 ng/mL
Folate, RBC: 1025 ng/mL (ref >498)
Hematocrit: 32.5 % — ABNORMAL LOW (ref 34.0–46.6)

## 2023-08-02 ENCOUNTER — Other Ambulatory Visit: Payer: Self-pay | Admitting: Cardiovascular Disease

## 2023-08-03 ENCOUNTER — Encounter (INDEPENDENT_AMBULATORY_CARE_PROVIDER_SITE_OTHER): Admitting: Ophthalmology

## 2023-08-03 DIAGNOSIS — Z794 Long term (current) use of insulin: Secondary | ICD-10-CM | POA: Diagnosis not present

## 2023-08-03 DIAGNOSIS — I10 Essential (primary) hypertension: Secondary | ICD-10-CM

## 2023-08-03 DIAGNOSIS — H35033 Hypertensive retinopathy, bilateral: Secondary | ICD-10-CM | POA: Diagnosis not present

## 2023-08-03 DIAGNOSIS — E113512 Type 2 diabetes mellitus with proliferative diabetic retinopathy with macular edema, left eye: Secondary | ICD-10-CM

## 2023-08-03 DIAGNOSIS — H43813 Vitreous degeneration, bilateral: Secondary | ICD-10-CM

## 2023-08-03 DIAGNOSIS — E113591 Type 2 diabetes mellitus with proliferative diabetic retinopathy without macular edema, right eye: Secondary | ICD-10-CM

## 2023-08-25 ENCOUNTER — Inpatient Hospital Stay: Attending: Hematology and Oncology

## 2023-08-25 ENCOUNTER — Inpatient Hospital Stay

## 2023-08-25 VITALS — BP 152/64 | Temp 97.7°F | Resp 16

## 2023-08-25 DIAGNOSIS — D631 Anemia in chronic kidney disease: Secondary | ICD-10-CM | POA: Insufficient documentation

## 2023-08-25 DIAGNOSIS — N1832 Chronic kidney disease, stage 3b: Secondary | ICD-10-CM

## 2023-08-25 DIAGNOSIS — D539 Nutritional anemia, unspecified: Secondary | ICD-10-CM

## 2023-08-25 LAB — CBC WITH DIFFERENTIAL/PLATELET
Abs Immature Granulocytes: 0.04 10*3/uL (ref 0.00–0.07)
Basophils Absolute: 0 10*3/uL (ref 0.0–0.1)
Basophils Relative: 1 %
Eosinophils Absolute: 0.1 10*3/uL (ref 0.0–0.5)
Eosinophils Relative: 2 %
HCT: 32.1 % — ABNORMAL LOW (ref 36.0–46.0)
Hemoglobin: 10.7 g/dL — ABNORMAL LOW (ref 12.0–15.0)
Immature Granulocytes: 1 %
Lymphocytes Relative: 28 %
Lymphs Abs: 2.2 10*3/uL (ref 0.7–4.0)
MCH: 29.6 pg (ref 26.0–34.0)
MCHC: 33.3 g/dL (ref 30.0–36.0)
MCV: 88.7 fL (ref 80.0–100.0)
Monocytes Absolute: 0.7 10*3/uL (ref 0.1–1.0)
Monocytes Relative: 9 %
Neutro Abs: 4.7 10*3/uL (ref 1.7–7.7)
Neutrophils Relative %: 59 %
Platelets: 210 10*3/uL (ref 150–400)
RBC: 3.62 MIL/uL — ABNORMAL LOW (ref 3.87–5.11)
RDW: 13.2 % (ref 11.5–15.5)
WBC: 7.8 10*3/uL (ref 4.0–10.5)
nRBC: 0 % (ref 0.0–0.2)

## 2023-08-25 MED ORDER — EPOETIN ALFA-EPBX 20000 UNIT/ML IJ SOLN
20000.0000 [IU] | Freq: Once | INTRAMUSCULAR | Status: AC
  Administered 2023-08-25: 20000 [IU] via SUBCUTANEOUS
  Filled 2023-08-25: qty 1

## 2023-08-30 ENCOUNTER — Encounter (INDEPENDENT_AMBULATORY_CARE_PROVIDER_SITE_OTHER): Admitting: Ophthalmology

## 2023-08-30 DIAGNOSIS — H35033 Hypertensive retinopathy, bilateral: Secondary | ICD-10-CM | POA: Diagnosis not present

## 2023-08-30 DIAGNOSIS — H43813 Vitreous degeneration, bilateral: Secondary | ICD-10-CM

## 2023-08-30 DIAGNOSIS — I10 Essential (primary) hypertension: Secondary | ICD-10-CM

## 2023-08-30 DIAGNOSIS — H35372 Puckering of macula, left eye: Secondary | ICD-10-CM | POA: Diagnosis not present

## 2023-08-30 DIAGNOSIS — E113512 Type 2 diabetes mellitus with proliferative diabetic retinopathy with macular edema, left eye: Secondary | ICD-10-CM

## 2023-08-30 DIAGNOSIS — E113591 Type 2 diabetes mellitus with proliferative diabetic retinopathy without macular edema, right eye: Secondary | ICD-10-CM

## 2023-09-22 ENCOUNTER — Inpatient Hospital Stay: Attending: Hematology and Oncology

## 2023-09-22 ENCOUNTER — Inpatient Hospital Stay

## 2023-09-22 DIAGNOSIS — D631 Anemia in chronic kidney disease: Secondary | ICD-10-CM | POA: Insufficient documentation

## 2023-09-22 DIAGNOSIS — N1832 Chronic kidney disease, stage 3b: Secondary | ICD-10-CM | POA: Insufficient documentation

## 2023-09-26 ENCOUNTER — Inpatient Hospital Stay

## 2023-09-26 ENCOUNTER — Telehealth: Payer: Self-pay

## 2023-09-26 DIAGNOSIS — N1832 Chronic kidney disease, stage 3b: Secondary | ICD-10-CM | POA: Diagnosis not present

## 2023-09-26 DIAGNOSIS — D539 Nutritional anemia, unspecified: Secondary | ICD-10-CM

## 2023-09-26 DIAGNOSIS — D631 Anemia in chronic kidney disease: Secondary | ICD-10-CM | POA: Diagnosis not present

## 2023-09-26 LAB — CBC WITH DIFFERENTIAL/PLATELET
Abs Immature Granulocytes: 0.02 10*3/uL (ref 0.00–0.07)
Basophils Absolute: 0.1 10*3/uL (ref 0.0–0.1)
Basophils Relative: 1 %
Eosinophils Absolute: 0.2 10*3/uL (ref 0.0–0.5)
Eosinophils Relative: 3 %
HCT: 35.5 % — ABNORMAL LOW (ref 36.0–46.0)
Hemoglobin: 11.5 g/dL — ABNORMAL LOW (ref 12.0–15.0)
Immature Granulocytes: 0 %
Lymphocytes Relative: 25 %
Lymphs Abs: 1.8 10*3/uL (ref 0.7–4.0)
MCH: 28.5 pg (ref 26.0–34.0)
MCHC: 32.4 g/dL (ref 30.0–36.0)
MCV: 88.1 fL (ref 80.0–100.0)
Monocytes Absolute: 0.5 10*3/uL (ref 0.1–1.0)
Monocytes Relative: 7 %
Neutro Abs: 4.8 10*3/uL (ref 1.7–7.7)
Neutrophils Relative %: 64 %
Platelets: 168 10*3/uL (ref 150–400)
RBC: 4.03 MIL/uL (ref 3.87–5.11)
RDW: 13.3 % (ref 11.5–15.5)
WBC: 7.5 10*3/uL (ref 4.0–10.5)
nRBC: 0 % (ref 0.0–0.2)

## 2023-09-26 NOTE — Progress Notes (Signed)
 Hgb 11.5 today. Injection held per parameters. Cornelius Dill, RN notified. Pt notified, provided with copy of labs. No distress noted.

## 2023-09-26 NOTE — Telephone Encounter (Signed)
 Attempted to call [name] and received a message phone out of service. Called and left a message for granddaughter to call the office so we update phone # for [facility].  Dr. Marton Sleeper canceled August appts for lab and injection

## 2023-09-27 ENCOUNTER — Encounter (INDEPENDENT_AMBULATORY_CARE_PROVIDER_SITE_OTHER): Admitting: Ophthalmology

## 2023-09-27 DIAGNOSIS — H43813 Vitreous degeneration, bilateral: Secondary | ICD-10-CM | POA: Diagnosis not present

## 2023-09-27 DIAGNOSIS — E113513 Type 2 diabetes mellitus with proliferative diabetic retinopathy with macular edema, bilateral: Secondary | ICD-10-CM

## 2023-09-27 DIAGNOSIS — Z794 Long term (current) use of insulin: Secondary | ICD-10-CM | POA: Diagnosis not present

## 2023-09-27 DIAGNOSIS — H35033 Hypertensive retinopathy, bilateral: Secondary | ICD-10-CM | POA: Diagnosis not present

## 2023-09-27 DIAGNOSIS — I10 Essential (primary) hypertension: Secondary | ICD-10-CM

## 2023-09-28 ENCOUNTER — Encounter: Payer: Self-pay | Admitting: Hematology and Oncology

## 2023-09-29 ENCOUNTER — Encounter (HOSPITAL_COMMUNITY): Payer: Self-pay

## 2023-09-29 ENCOUNTER — Ambulatory Visit (HOSPITAL_COMMUNITY): Admission: EM | Admit: 2023-09-29 | Discharge: 2023-09-29 | Disposition: A | Discharge status: Home / Self Care

## 2023-09-29 DIAGNOSIS — Z760 Encounter for issue of repeat prescription: Secondary | ICD-10-CM

## 2023-09-29 DIAGNOSIS — R079 Chest pain, unspecified: Secondary | ICD-10-CM

## 2023-09-29 DIAGNOSIS — I160 Hypertensive urgency: Secondary | ICD-10-CM

## 2023-09-29 MED ORDER — AMLODIPINE BESYLATE 5 MG PO TABS: 5.0000 mg | ORAL_TABLET | Freq: Every day | ORAL | 10 refills | Status: AC

## 2023-09-29 MED ORDER — ROSUVASTATIN CALCIUM 5 MG PO TABS: 5.0000 mg | ORAL_TABLET | Freq: Every morning | ORAL | 1 refills | Status: AC

## 2023-09-29 MED ORDER — GLIPIZIDE ER 10 MG PO TB24: 10.0000 mg | ORAL_TABLET | Freq: Every day | ORAL | 1 refills | Status: AC

## 2023-09-29 MED ORDER — PRODIGY TWIST TOP LANCETS 28G MISC
1.0000 | Freq: Every day | 1 refills | Status: AC | PRN
Start: 2023-09-29 — End: ?

## 2023-09-29 MED ORDER — LOSARTAN POTASSIUM-HCTZ 50-12.5 MG PO TABS: 1.0000 | ORAL_TABLET | Freq: Every day | ORAL | 1 refills | Status: DC

## 2023-09-29 MED ORDER — NITROGLYCERIN 0.4 MG SL SUBL: 0.4000 mg | SUBLINGUAL_TABLET | SUBLINGUAL | 0 refills | Status: AC | PRN

## 2023-09-29 MED ORDER — PRODIGY NO CODING BLOOD GLUC VI STRP: ORAL_STRIP | 1 refills | Status: AC

## 2023-09-29 MED ORDER — GLYXAMBI 25-5 MG PO TABS: 1.0000 | ORAL_TABLET | Freq: Every morning | ORAL | 1 refills | Status: AC

## 2023-09-29 NOTE — Discharge Instructions (Addendum)
  1. Hypertensive urgency (Primary) - ED EKG completed in UC shows sinus rhythm with second-degree AV block, with a ventricular rate of 49 bpm, no STEMI.  Possible inferior lateral ischemia. - Recheck vitals repeat blood pressure is 153/68, no significant concern for hypertensive emergency. - amLODipine  (NORVASC ) 5 MG tablet; Take 1 tablet (5 mg total) by mouth daily.  - losartan -hydrochlorothiazide  (HYZAAR) 50-12.5 MG tablet; Take 1 tablet by mouth daily.   2. Medication refill - glipiZIDE  (GLUCOTROL  XL) 10 MG 24 hr tablet; Take 1 tablet (10 mg total) by mouth daily.  - GLYXAMBI 25-5 MG TABS; Take 1 tablet by mouth every morning.   - rosuvastatin  (CRESTOR ) 5 MG tablet; Take 1 tablet (5 mg total) by mouth every morning.  - PRODIGY NO CODING BLOOD GLUC test strip; Use as instructed  Dispense: 100 each; Refill: 1 - Prodigy Twist Top Lancets 28G MISC; 1 each by Other route daily as needed.  Dispense: 100 each; Refill: 1 - nitroGLYCERIN  (NITROSTAT ) 0.4 MG SL tablet; Place 1 tablet (0.4 mg total) under the tongue every 5 (five) minutes as needed for chest pain.   -Continue to monitor symptoms for any change in severity if there is any escalation of current symptoms or development of new symptoms, such as chest pain, shortness of breath, weakness, dizziness, altered mental status, confusion, intractable nausea and vomiting. Follow-up in ER for further evaluation and management.

## 2023-09-29 NOTE — ED Provider Notes (Signed)
 [facility]  Note:  This document was prepared using Dragon voice recognition software and may include unintentional dictation errors.  MRN: [id number] DOB: 1945/10/17  Subjective:   [name] is a 78 y.o. female presenting for high blood pressure, dizziness, blurred vision, headaches x 3 to 4 days.  Patient reports that her primary care provider retired she has not get appointment with a new provider yet.  Patient has run out of her blood pressure medications approximately 6 months ago as well as a lot of other previously prescribed medications.  Patient is here for evaluation of blood pressure and refill of blood pressure medications along with other medications.  Patient denies any chest pain, shortness of breath, weakness, nausea/vomiting.   Current Facility-Administered Medications:    sodium chloride  flush (NS) 0.9 % injection 3 mL, 3 mL, Intravenous, Q12H, Avanell Leigh, MD  Current Outpatient Medications:    aspirin  81 MG chewable tablet, Chew 81 mg by mouth daily., Disp: , Rfl:    clopidogrel  (PLAVIX ) 75 MG tablet, Take 1 tablet (75 mg total) by mouth every morning., Disp: 15 tablet, Rfl: 0   famotidine  (PEPCID ) 20 MG tablet, TAKE 1 TABLET(20 MG) BY MOUTH TWICE DAILY AS NEEDED FOR HEARTBURN OR INDIGESTION, Disp: 90 tablet, Rfl: 2   isosorbide  mononitrate (IMDUR ) 60 MG 24 hr tablet, Take 1 tablet (60 mg total) by mouth daily. <PLEASE MAKE APPOINTMENT FOR REFILLS>, Disp: 15 tablet, Rfl: 0   meclizine (ANTIVERT) 25 MG tablet, SMARTSIG:1 Tablet(s) By Mouth Every 12 Hours PRN, Disp: , Rfl:    polyethylene glycol powder (GLYCOLAX /MIRALAX ) 17 GM/SCOOP powder, Take 17 g by mouth daily. (Patient taking differently: Take 17 g by mouth daily as needed for mild constipation or moderate constipation.), Disp: 3350 g, Rfl: 1   prednisoLONE acetate (PRED FORTE) 1 % ophthalmic suspension, Place 1 drop into the left eye., Disp: , Rfl:    amLODipine  (NORVASC ) 5 MG tablet, Take 1  tablet (5 mg total) by mouth daily. <PLEASE MAKE APPOINTMENT FOR REFILLS>, Disp: 15 tablet, Rfl: 10   glipiZIDE  (GLUCOTROL  XL) 10 MG 24 hr tablet, Take 1 tablet (10 mg total) by mouth daily., Disp: 30 tablet, Rfl: 1   GLYXAMBI 25-5 MG TABS, Take 1 tablet by mouth every morning., Disp: 30 tablet, Rfl: 1   losartan -hydrochlorothiazide  (HYZAAR) 50-12.5 MG tablet, Take 1 tablet by mouth daily. <PLEASE MAKE APPOINTMENT FOR REFILLS>, Disp: 30 tablet, Rfl: 1   nitroGLYCERIN  (NITROSTAT ) 0.4 MG SL tablet, Place 1 tablet (0.4 mg total) under the tongue every 5 (five) minutes as needed for chest pain., Disp: 12 tablet, Rfl: 0   PRODIGY NO CODING BLOOD GLUC test strip, Use as instructed, Disp: 100 each, Rfl: 1   Prodigy Twist Top Lancets 28G MISC, 1 each by Other route daily as needed., Disp: 100 each, Rfl: 1   rosuvastatin  (CRESTOR ) 5 MG tablet, Take 1 tablet (5 mg total) by mouth every morning., Disp: 30 tablet, Rfl: 1   Allergies  Allergen Reactions   Metformin And Related Diarrhea    Past Medical History:  Diagnosis Date   Allergy    Anemia    Asthma    Blood transfusion without reported diagnosis 12/23/2004   with colon cancer   Brain tumor (benign) (HCC)    CKD (chronic kidney disease)    Colon cancer (HCC) 03/07/2005   COVID-19 04/2019   DEPRESSIVE DISORDER, NOS 06/08/2006   Qualifier: Diagnosis of  By: Winona Haw     Diabetes mellitus  Type 2   Difficult intravenous access 05/17/2022   Dysphagia 11/17/2014   ENDOSCOPIC IMPRESSION: 1) Single distal esophageal erison - superficial and 5-7 mm. Biopsied 2) S/P fundoplication - GE junction dilated to 20 mm (18-19-20 mm balloon) 3) Otherwise normal except some tortuosity of esophagus RECOMMENDATIONS: 1. Clear liquids until 1100, then soft foods rest of day. Resume prior diet tomorrow. 2. Office will call with results 3. If persistent dysphagia anticipate Ba   Erythropoietin  (EPO) stimulating agent anemia management patient 10/01/2020    EXTERNAL HEMORRHOIDS 10/23/2001   Qualifier: Diagnosis of  By: Tereasa Felty CMA (AAMA), Robin     Female bladder prolapse    Gastritis    GERD (gastroesophageal reflux disease)    Glaucoma    Hip pain 10/30/2019   History of colon cancer 06/08/2006   Status post resection (right colectomy) in 2006. Tumor stage was T3 N0 IIA. Received 4 cycles of FOLFOX and 8 cycles of chemotherapy with 5 fluorouracil leucovorin and 5FU from 04/27/2005 through 10/13/2005 Colonoscopy 06/2011 no polyps (suspected polyp was not true polyp) - routine repeat colonoscopy about 06/2016   Hypercholesterolemia    Hypertension    Itching 01/09/2012   For the past 3 weeks she has noticed full body itching.  She has not noticed any specific areas for the itching is worse.  She has tried to apply triamcinolone  and petroleum jelly to reduce the itching.  This does not seem to be effective so far.  She denies right upper quadrant pain and denies any history of illicit drug use.  She has not had issues in the past with bedbugs nor does she have any    Malignant neoplasm of colon (HCC) 06/28/2019   MENINGIOMA 02/07/2007   Qualifier: Diagnosis of  By: Scotty Cyphers MD, HEIDI     Pain in both feet 12/14/2017   Patient reports that she had has had some foot pain bilaterally for the past 7 years.  She reports that it seems to have gotten worse in the past 2 months.  This pain is not burning.  She reports good sensation in both feet.  She sometimes has trouble walking due to the pain.  It is described as though she has a wound on her foot.  She has not had recent trauma to her feet and denies open wounds o   Unstable angina (HCC) 01/15/2019   Vertigo    Pt Reported     Past Surgical History:  Procedure Laterality Date   ABDOMINAL AORTOGRAM W/LOWER EXTREMITY Left 03/23/2020   Procedure: ABDOMINAL AORTOGRAM W/LOWER EXTREMITY;  Surgeon: Avanell Leigh, MD;  Location: [facility];  Service: Cardiovascular;  Laterality: Left;    ABDOMINAL HYSTERECTOMY  1976   BALLOON DILATION N/A 08/23/2017   Procedure: BALLOON DILATION;  Surgeon: Janel Medford, MD;  Location: [facility];  Service: Endoscopy;  Laterality: N/A;  Esophageal Diltation   bladder tac     CARDIAC CATHETERIZATION  2005   CATARACT EXTRACTION, BILATERAL     COLONOSCOPY     COLONOSCOPY W/ BIOPSIES     CORONARY PRESSURE/FFR STUDY N/A 01/17/2019   Procedure: INTRAVASCULAR PRESSURE WIRE/FFR STUDY;  Surgeon: Sammy Crisp, MD;  Location: [facility];  Service: Cardiovascular;  Laterality: N/A;   CORONARY STENT INTERVENTION N/A 01/17/2019   Procedure: CORONARY STENT INTERVENTION;  Surgeon: Sammy Crisp, MD;  Location: [facility];  Service: Cardiovascular;  Laterality: N/A;   EMBOLIZATION (CATH LAB) Left 03/23/2020   Procedure: EMBOLIZATION;  Surgeon: Katheryne Pane,  Frederico Jan, MD;  Location: [facility];  Service: Cardiovascular;  Laterality: Left;  SFA AND DCB   ESOPHAGEAL MANOMETRY N/A 03/20/2015   Procedure: ESOPHAGEAL MANOMETRY (EM);  Surgeon: Kenney Peacemaker, MD;  Location: [facility];  Service: Endoscopy;  Laterality: N/A;   ESOPHAGOGASTRODUODENOSCOPY (EGD) WITH PROPOFOL  N/A 08/23/2017   Procedure: ESOPHAGOGASTRODUODENOSCOPY (EGD) WITH PROPOFOL ;  Surgeon: Janel Medford, MD;  Location: [facility];  Service: Endoscopy;  Laterality: N/A;   EYE SURGERY     mole removed left eye   INTERSTIM IMPLANT PLACEMENT  08/25/2016   [employer]tronic Bladder control device: Operator: Erman Hayward, MD ([place])   LAPAROSCOPIC NISSEN FUNDOPLICATION  1997   LEFT HEART CATH AND CORONARY ANGIOGRAPHY N/A 04/17/2018   Procedure: LEFT HEART CATH AND CORONARY ANGIOGRAPHY;  Surgeon: Arty Binning, MD;  Location: [facility];  Service: Cardiovascular;  Laterality: N/A;   LEFT HEART CATH AND CORONARY ANGIOGRAPHY N/A 01/17/2019   Procedure: LEFT HEART CATH AND CORONARY ANGIOGRAPHY;  Surgeon: Sammy Crisp, MD;  Location: [facility];  Service:  Cardiovascular;  Laterality: N/A;   LOWER EXTREMITY ANGIOGRAPHY  05/31/2021   Procedure: Lower Extremity Angiography;  Surgeon: Avanell Leigh, MD;  Location: [facility] INVASIVE CV LAB;  Service: Cardiovascular;;   PERIPHERAL VASCULAR ATHERECTOMY  05/31/2021   Procedure: PERIPHERAL VASCULAR ATHERECTOMY;  Surgeon: Avanell Leigh, MD;  Location: [facility] INVAS[facility] CV LAB;  Service: Cardiovascular;;  RT SFA   repair prolapsedbladder     RIGHT COLECTOMY  03/07/2005   TMJ ARTHROPLASTY     TOOTH EXTRACTION N/A 07/08/2022   Procedure: EXTRACTION TEETH NUMBER TWO, FOUR, ELEVEN, THIRTEEN, FIFTEEN, AVEOLOPLASTY, AND REMOVAL PALATAL TORUS;  Surgeon: Ascencion Lava, DMD;  Location: [facility];  Service: Oral Surgery;  Laterality: N/A;   UPPER GASTROINTESTINAL ENDOSCOPY      Family History  Problem Relation Age of Onset   Bone cancer Father    Diabetes Father    Hypotension Father    Kidney failure Father    Diabetes Daughter    Hypertension Daughter    Heart attack Daughter    Suicidality Son        deceased   Lymphoma Other    Colon cancer Neg Hx    Stomach cancer Neg Hx    Rectal cancer Neg Hx    Esophageal cancer Neg Hx     Social History   Tobacco Use   Smoking status: Never   Smokeless tobacco: Never  Vaping Use   Vaping status: Never Used  Substance Use Topics   Alcohol use: No   Drug use: No    ROS Refer to HPI for ROS details.  Objective:   Vitals: BP (!) 153/68 (BP Location: Left Arm)   Pulse (!) 50   Temp 98.3 F (36.8 C) (Oral)   Resp 16   Ht 5' 3 (1.6 m)   Wt 120 lb (54.4 kg)   SpO2 98%   BMI 21.26 kg/m   Physical Exam Vitals and nursing note reviewed.  Constitutional:      General: She is not in acute distress.    Appearance: Normal appearance. She is well-developed. She is not ill-appearing, toxic-appearing or diaphoretic.  HENT:     Head: Normocephalic and atraumatic.   Cardiovascular:     Rate and Rhythm: Regular rhythm. Bradycardia present.     Heart sounds:  Normal heart sounds. No murmur heard. Pulmonary:     Effort: Pulmonary effort is normal. No respiratory distress.  Breath sounds: Normal breath sounds. No stridor. No wheezing, rhonchi or rales.  Chest:     Chest wall: No tenderness.   Skin:    General: Skin is warm and dry.   Neurological:     General: No focal deficit present.     Mental Status: She is alert and oriented to person, place, and time.   Psychiatric:        Mood and Affect: Mood normal.        Behavior: Behavior normal.     Procedures  No results found for this or any previous visit (from the past 24 hours).  No results found.   Assessment and Plan :     Discharge Instructions       1. Hypertensive urgency (Primary) - ED EKG completed in UC shows sinus rhythm with second-degree AV block, with a ventricular rate of 49 bpm, no STEMI.  Possible inferior lateral ischemia. - Recheck vitals repeat blood pressure is 153/68, no significant concern for hypertensive emergency. - amLODipine  (NORVASC ) 5 MG tablet; Take 1 tablet (5 mg total) by mouth daily.  - losartan -hydrochlorothiazide  (HYZAAR) 50-12.5 MG tablet; Take 1 tablet by mouth daily.   2. Medication refill - glipiZIDE  (GLUCOTROL  XL) 10 MG 24 hr tablet; Take 1 tablet (10 mg total) by mouth daily.  - GLYXAMBI 25-5 MG TABS; Take 1 tablet by mouth every morning.   - rosuvastatin  (CRESTOR ) 5 MG tablet; Take 1 tablet (5 mg total) by mouth every morning.  - PRODIGY NO CODING BLOOD GLUC test strip; Use as instructed  Dispense: 100 each; Refill: 1 - Prodigy Twist Top Lancets 28G MISC; 1 each by Other route daily as needed.  Dispense: 100 each; Refill: 1 - nitroGLYCERIN  (NITROSTAT ) 0.4 MG SL tablet; Place 1 tablet (0.4 mg total) under the tongue every 5 (five) minutes as needed for chest pain.   -Continue to monitor symptoms for any change in severity if there is any escalation of current symptoms or development of new symptoms, such as chest pain, shortness of  breath, weakness, dizziness, altered mental status, confusion, intractable nausea and vomiting. Follow-up in ER for further evaluation and management.      Acadia Thammavong B Charonda Hefter   Gino Garrabrant, [place], [place] 09/29/23 1830

## 2023-09-29 NOTE — ED Triage Notes (Addendum)
 Chief Complaint: Elevated BP, vision changes, dizziness, feeling swimmy headed, and headaches. Patient has been out of her medications as her primary has retired. Patient has not gotten a new provider yet.   Sick exposure: No  Onset: 4 days ago   Prescriptions or OTC medications tried: No   with no relief  New foods, medications, or products: No  Recent Travel: No

## 2023-10-05 DIAGNOSIS — N1832 Chronic kidney disease, stage 3b: Secondary | ICD-10-CM | POA: Diagnosis not present

## 2023-10-20 ENCOUNTER — Inpatient Hospital Stay

## 2023-10-20 ENCOUNTER — Telehealth: Payer: Self-pay

## 2023-10-20 ENCOUNTER — Inpatient Hospital Stay: Attending: Hematology and Oncology

## 2023-10-20 DIAGNOSIS — D631 Anemia in chronic kidney disease: Secondary | ICD-10-CM | POA: Insufficient documentation

## 2023-10-20 DIAGNOSIS — D539 Nutritional anemia, unspecified: Secondary | ICD-10-CM

## 2023-10-20 DIAGNOSIS — N1832 Chronic kidney disease, stage 3b: Secondary | ICD-10-CM | POA: Insufficient documentation

## 2023-10-20 LAB — CBC WITH DIFFERENTIAL/PLATELET
Abs Immature Granulocytes: 0.02 K/uL (ref 0.00–0.07)
Basophils Absolute: 0 K/uL (ref 0.0–0.1)
Basophils Relative: 0 %
Eosinophils Absolute: 0.1 K/uL (ref 0.0–0.5)
Eosinophils Relative: 2 %
HCT: 36 % (ref 36.0–46.0)
Hemoglobin: 11.8 g/dL — ABNORMAL LOW (ref 12.0–15.0)
Immature Granulocytes: 0 %
Lymphocytes Relative: 24 %
Lymphs Abs: 1.8 K/uL (ref 0.7–4.0)
MCH: 28.9 pg (ref 26.0–34.0)
MCHC: 32.8 g/dL (ref 30.0–36.0)
MCV: 88 fL (ref 80.0–100.0)
Monocytes Absolute: 0.6 K/uL (ref 0.1–1.0)
Monocytes Relative: 8 %
Neutro Abs: 4.8 K/uL (ref 1.7–7.7)
Neutrophils Relative %: 66 %
Platelets: 203 K/uL (ref 150–400)
RBC: 4.09 MIL/uL (ref 3.87–5.11)
RDW: 13.6 % (ref 11.5–15.5)
WBC: 7.3 K/uL (ref 4.0–10.5)
nRBC: 0 % (ref 0.0–0.2)

## 2023-10-20 NOTE — Progress Notes (Unsigned)
 Pt's Hgb 11.8 today. Retacrit  held per parameters. Lab results and schedules printed and provided to pt. Pt verbalized understanding.

## 2023-10-20 NOTE — Telephone Encounter (Signed)
 Patient's hemoglobin is 11.8 today.  Patient does not require epoetin  injection per provider parameters.  Flush RN made aware and patient updated.  Appointment canceled.

## 2023-10-25 ENCOUNTER — Encounter (INDEPENDENT_AMBULATORY_CARE_PROVIDER_SITE_OTHER): Admitting: Ophthalmology

## 2023-10-25 DIAGNOSIS — H35033 Hypertensive retinopathy, bilateral: Secondary | ICD-10-CM

## 2023-10-25 DIAGNOSIS — H43813 Vitreous degeneration, bilateral: Secondary | ICD-10-CM

## 2023-10-25 DIAGNOSIS — I10 Essential (primary) hypertension: Secondary | ICD-10-CM | POA: Diagnosis not present

## 2023-10-25 DIAGNOSIS — E113591 Type 2 diabetes mellitus with proliferative diabetic retinopathy without macular edema, right eye: Secondary | ICD-10-CM | POA: Diagnosis not present

## 2023-10-25 DIAGNOSIS — E113512 Type 2 diabetes mellitus with proliferative diabetic retinopathy with macular edema, left eye: Secondary | ICD-10-CM

## 2023-10-25 DIAGNOSIS — Z794 Long term (current) use of insulin: Secondary | ICD-10-CM | POA: Diagnosis not present

## 2023-10-27 DIAGNOSIS — I25118 Atherosclerotic heart disease of native coronary artery with other forms of angina pectoris: Secondary | ICD-10-CM | POA: Diagnosis not present

## 2023-10-27 DIAGNOSIS — E118 Type 2 diabetes mellitus with unspecified complications: Secondary | ICD-10-CM | POA: Diagnosis not present

## 2023-10-27 DIAGNOSIS — I739 Peripheral vascular disease, unspecified: Secondary | ICD-10-CM | POA: Diagnosis not present

## 2023-10-27 DIAGNOSIS — N1832 Chronic kidney disease, stage 3b: Secondary | ICD-10-CM | POA: Diagnosis not present

## 2023-10-27 DIAGNOSIS — Z7409 Other reduced mobility: Secondary | ICD-10-CM | POA: Diagnosis not present

## 2023-10-27 DIAGNOSIS — E538 Deficiency of other specified B group vitamins: Secondary | ICD-10-CM | POA: Diagnosis not present

## 2023-10-27 DIAGNOSIS — E7800 Pure hypercholesterolemia, unspecified: Secondary | ICD-10-CM | POA: Diagnosis not present

## 2023-10-27 DIAGNOSIS — I10 Essential (primary) hypertension: Secondary | ICD-10-CM | POA: Diagnosis not present

## 2023-10-27 DIAGNOSIS — K219 Gastro-esophageal reflux disease without esophagitis: Secondary | ICD-10-CM | POA: Diagnosis not present

## 2023-10-31 ENCOUNTER — Encounter (HOSPITAL_COMMUNITY): Payer: Self-pay | Admitting: *Deleted

## 2023-10-31 ENCOUNTER — Emergency Department (HOSPITAL_COMMUNITY)

## 2023-10-31 ENCOUNTER — Other Ambulatory Visit: Payer: Self-pay

## 2023-10-31 ENCOUNTER — Emergency Department (HOSPITAL_COMMUNITY)
Admission: EM | Admit: 2023-10-31 | Discharge: 2023-10-31 | Disposition: A | Discharge status: Home / Self Care | Attending: Emergency Medicine | Admitting: Emergency Medicine

## 2023-10-31 DIAGNOSIS — Z7984 Long term (current) use of oral hypoglycemic drugs: Secondary | ICD-10-CM | POA: Insufficient documentation

## 2023-10-31 DIAGNOSIS — S0990XA Unspecified injury of head, initial encounter: Secondary | ICD-10-CM | POA: Diagnosis not present

## 2023-10-31 DIAGNOSIS — D649 Anemia, unspecified: Secondary | ICD-10-CM | POA: Insufficient documentation

## 2023-10-31 DIAGNOSIS — R410 Disorientation, unspecified: Secondary | ICD-10-CM | POA: Diagnosis not present

## 2023-10-31 DIAGNOSIS — E1122 Type 2 diabetes mellitus with diabetic chronic kidney disease: Secondary | ICD-10-CM | POA: Diagnosis not present

## 2023-10-31 DIAGNOSIS — Z7901 Long term (current) use of anticoagulants: Secondary | ICD-10-CM | POA: Insufficient documentation

## 2023-10-31 DIAGNOSIS — I129 Hypertensive chronic kidney disease with stage 1 through stage 4 chronic kidney disease, or unspecified chronic kidney disease: Secondary | ICD-10-CM | POA: Insufficient documentation

## 2023-10-31 DIAGNOSIS — N189 Chronic kidney disease, unspecified: Secondary | ICD-10-CM | POA: Diagnosis not present

## 2023-10-31 DIAGNOSIS — E11649 Type 2 diabetes mellitus with hypoglycemia without coma: Secondary | ICD-10-CM | POA: Insufficient documentation

## 2023-10-31 DIAGNOSIS — J45909 Unspecified asthma, uncomplicated: Secondary | ICD-10-CM | POA: Diagnosis not present

## 2023-10-31 DIAGNOSIS — E162 Hypoglycemia, unspecified: Secondary | ICD-10-CM

## 2023-10-31 DIAGNOSIS — Z79899 Other long term (current) drug therapy: Secondary | ICD-10-CM | POA: Diagnosis not present

## 2023-10-31 DIAGNOSIS — Z7982 Long term (current) use of aspirin: Secondary | ICD-10-CM | POA: Insufficient documentation

## 2023-10-31 DIAGNOSIS — Z85038 Personal history of other malignant neoplasm of large intestine: Secondary | ICD-10-CM | POA: Diagnosis not present

## 2023-10-31 DIAGNOSIS — G9389 Other specified disorders of brain: Secondary | ICD-10-CM | POA: Diagnosis not present

## 2023-10-31 DIAGNOSIS — I499 Cardiac arrhythmia, unspecified: Secondary | ICD-10-CM | POA: Diagnosis not present

## 2023-10-31 DIAGNOSIS — R404 Transient alteration of awareness: Secondary | ICD-10-CM | POA: Diagnosis not present

## 2023-10-31 DIAGNOSIS — R4182 Altered mental status, unspecified: Secondary | ICD-10-CM | POA: Diagnosis present

## 2023-10-31 LAB — HEPATIC FUNCTION PANEL
ALT: 20 U/L (ref 0–44)
AST: 33 U/L (ref 15–41)
Albumin: 3.4 g/dL — ABNORMAL LOW (ref 3.5–5.0)
Alkaline Phosphatase: 62 U/L (ref 38–126)
Bilirubin, Direct: 0.2 mg/dL (ref 0.0–0.2)
Indirect Bilirubin: 0.7 mg/dL (ref 0.3–0.9)
Total Bilirubin: 0.9 mg/dL (ref 0.0–1.2)
Total Protein: 7 g/dL (ref 6.5–8.1)

## 2023-10-31 LAB — CBC WITH DIFFERENTIAL/PLATELET
Abs Immature Granulocytes: 0.01 K/uL (ref 0.00–0.07)
Basophils Absolute: 0 K/uL (ref 0.0–0.1)
Basophils Relative: 0 %
Eosinophils Absolute: 0 K/uL (ref 0.0–0.5)
Eosinophils Relative: 0 %
HCT: 36.5 % (ref 36.0–46.0)
Hemoglobin: 11.6 g/dL — ABNORMAL LOW (ref 12.0–15.0)
Immature Granulocytes: 0 %
Lymphocytes Relative: 14 %
Lymphs Abs: 1.2 K/uL (ref 0.7–4.0)
MCH: 28.9 pg (ref 26.0–34.0)
MCHC: 31.8 g/dL (ref 30.0–36.0)
MCV: 91 fL (ref 80.0–100.0)
Monocytes Absolute: 0.5 K/uL (ref 0.1–1.0)
Monocytes Relative: 6 %
Neutro Abs: 6.9 K/uL (ref 1.7–7.7)
Neutrophils Relative %: 80 %
Platelets: 208 K/uL (ref 150–400)
RBC: 4.01 MIL/uL (ref 3.87–5.11)
RDW: 13.3 % (ref 11.5–15.5)
WBC: 8.7 K/uL (ref 4.0–10.5)
nRBC: 0 % (ref 0.0–0.2)

## 2023-10-31 LAB — URINALYSIS, W/ REFLEX TO CULTURE (INFECTION SUSPECTED)
Bilirubin Urine: NEGATIVE
Glucose, UA: >=500 mg/dL — AB
Ketones, ur: NEGATIVE mg/dL
Leukocytes,Ua: NEGATIVE
Nitrite: NEGATIVE
Protein, ur: 30 mg/dL — AB
Specific Gravity, Urine: 1.014 (ref 1.005–1.030)
pH: 5 (ref 5.0–8.0)

## 2023-10-31 LAB — VITAMIN B12: Vitamin B-12: 1197 pg/mL — ABNORMAL HIGH (ref 180–914)

## 2023-10-31 LAB — CBG MONITORING, ED
Glucose-Capillary: 101 mg/dL — ABNORMAL HIGH (ref 70–99)
Glucose-Capillary: 442 mg/dL — ABNORMAL HIGH (ref 70–99)
Glucose-Capillary: 84 mg/dL (ref 70–99)

## 2023-10-31 LAB — BASIC METABOLIC PANEL WITH GFR
Anion gap: 10 (ref 5–15)
BUN: 14 mg/dL (ref 8–23)
CO2: 26 mmol/L (ref 22–32)
Calcium: 9.9 mg/dL (ref 8.9–10.3)
Chloride: 107 mmol/L (ref 98–111)
Creatinine, Ser: 1.14 mg/dL — ABNORMAL HIGH (ref 0.44–1.00)
GFR, Estimated: 50 mL/min — ABNORMAL LOW (ref >60)
Glucose, Bld: 120 mg/dL — ABNORMAL HIGH (ref 70–99)
Potassium: 3.5 mmol/L (ref 3.5–5.1)
Sodium: 143 mmol/L (ref 135–145)

## 2023-10-31 LAB — TSH: TSH: 0.293 u[IU]/mL — ABNORMAL LOW (ref 0.350–4.500)

## 2023-10-31 LAB — AMMONIA: Ammonia: 20 umol/L (ref 9–35)

## 2023-10-31 LAB — I-STAT CG4 LACTIC ACID, ED: Lactic Acid, Venous: 1.3 mmol/L (ref 0.5–1.9)

## 2023-10-31 LAB — T4, FREE: Free T4: 0.9 ng/dL (ref 0.61–1.12)

## 2023-10-31 NOTE — ED Notes (Signed)
 Registration finished at [facility]. Pt reports I feel normal, like I could go home.

## 2023-10-31 NOTE — ED Triage Notes (Signed)
 BIB [facility] from facility, new to facility, LKN 1930 last night, EMS called for AMS, confusion and lethargy. BS 41 initially. EMS gave pt pepsi, then glucogon, then oral glucose. Unable to establish IV, attempted x3.VSS. SB 50 on monitor. BS up to 55 after pepsi, then up to 98. BS dropped to 77 on arrival at 1845. Then given oral glucose at 1850 by EMS. Pt more alert, NAD, calm, interactive, appropriate, skin W&D. A&Ox4.

## 2023-10-31 NOTE — ED Notes (Signed)
 Pt had to use bedpan on arrival.  IV established, blood and urine sent to lab, EDP in to see, at [facility] Family at [facility].

## 2023-10-31 NOTE — ED Provider Notes (Incomplete)
 [place] [facility]MENT AT [facility] Provider Note   CSN: [id number] Arrival date & time: 10/31/23  1644     Patient presents with: Hypoglycemia   [name] is a 78 y.o. female.  {Add pertinent medical, surgical, social history, OB history to HPI:32947} HPI     78 year old female with a history of type 2 diabetes, history of colon cancer, hypertension, hyperlipidemia, meningioma who presents with concern for altered mental status, found to have hypoglycemia with EMS, in the setting of a few days of increased confusion.  Daughter reports that over the last 3 months, she has noted that her mother has been forgetful at times, leaving on the stove.  She had been living with her daughter or the patient's granddaughter, however she wants to get her own place and was just moved into assisted living facility this Saturday.  Reports she administered her own medications prior to the move and continues to do so now the appended independent living facility.  Reports that she had a good meal last night for dinner, told her daughter before bed and had a banana as she took her diabetes medications.  She does not remember taking her diabetes medications this morning.  Remembers trying to get out of bed, and scooting herself to the edge of bed but being unable to stand up and walk once she was there.  She slid down to the floor.  She did not have a significant fall or hit her head.  Reports she did fall a week ago.  When her daughter could not get in touch with her she sent her granddaughter over who found the patient sitting on the floor next to her bed.  When EMS arrived, they found her glucose to be 40.  They gave her oral glucose and it came up to 55, they gave her glucagon and more oral glucose and it went up to the 80s.  She denies any fever, nausea, vomiting, diarrhea, black or bloody stools, change in medications, chest pain, shortness of breath, abdominal pain,  headache.  Daughter reports over the last couple of days she has been more confused, noting that she had left the water running in the sink, left the stove on, and had urinated next to the toilet with toilet paper sitting on the floor    Past Medical History:  Diagnosis Date   Allergy    Anemia    Asthma    Blood transfusion without reported diagnosis 12/23/2004   with colon cancer   Brain tumor (benign) (HCC)    CKD (chronic kidney disease)    Colon cancer (HCC) 03/07/2005   COVID-19 04/2019   DEPRESSIVE DISORDER, NOS 06/08/2006   Qualifier: Diagnosis of  By: Manford Longs     Diabetes mellitus    Type 2   Difficult intravenous access 05/17/2022   Dysphagia 11/17/2014   ENDOSCOPIC IMPRESSION: 1) Single distal esophageal erison - superficial and 5-7 mm. Biopsied 2) S/P fundoplication - GE junction dilated to 20 mm (18-19-20 mm balloon) 3) Otherwise normal except some tortuosity of esophagus RECOMMENDATIONS: 1. Clear liquids until 1100, then soft foods rest of day. Resume prior diet tomorrow. 2. Office will call with results 3. If persistent dysphagia anticipate Ba   Erythropoietin  (EPO) stimulating agent anemia management patient 10/01/2020   EXTERNAL HEMORRHOIDS 10/23/2001   Qualifier: Diagnosis of  By: Bettie CMA (AAMA), Robin     Female bladder prolapse    Gastritis    GERD (gastroesophageal reflux disease)  Glaucoma    Hip pain 10/30/2019   History of colon cancer 06/08/2006   Status post resection (right colectomy) in 2006. Tumor stage was T3 N0 IIA. Received 4 cycles of FOLFOX and 8 cycles of chemotherapy with 5 fluorouracil leucovorin and 5FU from 04/27/2005 through 10/13/2005 Colonoscopy 06/2011 no polyps (suspected polyp was not true polyp) - routine repeat colonoscopy about 06/2016   Hypercholesterolemia    Hypertension    Itching 01/09/2012   For the past 3 weeks she has noticed full body itching.  She has not noticed any specific areas for the itching is worse.  She  has tried to apply triamcinolone  and petroleum jelly to reduce the itching.  This does not seem to be effective so far.  She denies right upper quadrant pain and denies any history of illicit drug use.  She has not had issues in the past with bedbugs nor does she have any    Malignant neoplasm of colon (HCC) 06/28/2019   MENINGIOMA 02/07/2007   Qualifier: Diagnosis of  By: ROJELIO MD, HEIDI     Pain in both feet 12/14/2017   Patient reports that she had has had some foot pain bilaterally for the past 7 years.  She reports that it seems to have gotten worse in the past 2 months.  This pain is not burning.  She reports good sensation in both feet.  She sometimes has trouble walking due to the pain.  It is described as though she has a wound on her foot.  She has not had recent trauma to her feet and denies open wounds o   Unstable angina (HCC) 01/15/2019   Vertigo    Pt Reported     Prior to Admission medications   Medication Sig Start Date End Date Taking? Authorizing Provider  amLODipine  (NORVASC ) 5 MG tablet Take 1 tablet (5 mg total) by mouth daily. <PLEASE MAKE APPOINTMENT FOR REFILLS> 09/29/23   Reddick, Ethel B, NP  aspirin  81 MG chewable tablet Chew 81 mg by mouth daily.    [provider]  clopidogrel  (PLAVIX ) 75 MG tablet Take 1 tablet (75 mg total) by mouth every morning. 06/21/23   Lucien Orren SAILOR, PA-C  famotidine  (PEPCID ) 20 MG tablet TAKE 1 TABLET(20 MG) BY MOUTH TWICE DAILY AS NEEDED FOR HEARTBURN OR INDIGESTION 09/02/20   Dempsey Coy, MD  glipiZIDE  (GLUCOTROL  XL) 10 MG 24 hr tablet Take 1 tablet (10 mg total) by mouth daily. 09/29/23   Reddick, Johnathan B, NP  GLYXAMBI  25-5 MG TABS Take 1 tablet by mouth every morning. 09/29/23   Reddick, Johnathan B, NP  isosorbide  mononitrate (IMDUR ) 60 MG 24 hr tablet Take 1 tablet (60 mg total) by mouth daily. <PLEASE MAKE APPOINTMENT FOR REFILLS> 07/12/23   Court Dorn PARAS, MD  losartan -hydrochlorothiazide  (HYZAAR) 50-12.5 MG tablet  Take 1 tablet by mouth daily. <PLEASE MAKE APPOINTMENT FOR REFILLS> 09/29/23   Reddick, Johnathan B, NP  meclizine (ANTIVERT) 25 MG tablet SMARTSIG:1 Tablet(s) By Mouth Every 12 Hours PRN 08/30/21   [provider]  nitroGLYCERIN  (NITROSTAT ) 0.4 MG SL tablet Place 1 tablet (0.4 mg total) under the tongue every 5 (five) minutes as needed for chest pain. 09/29/23   Reddick, Johnathan B, NP  polyethylene glycol powder (GLYCOLAX /MIRALAX ) 17 GM/SCOOP powder Take 17 g by mouth daily. Patient taking differently: Take 17 g by mouth daily as needed for mild constipation or moderate constipation. 10/01/20   McDiarmid, Todd D, MD  prednisoLONE acetate (PRED FORTE) 1 % ophthalmic  suspension Place 1 drop into the left eye. 09/05/21   [provider]  PRODIGY NO CODING BLOOD GLUC test strip Use as instructed 09/29/23   Reddick, Johnathan B, NP  Prodigy Twist Top Lancets 28G MISC 1 each by Other route daily as needed. 09/29/23   Reddick, Johnathan B, NP  rosuvastatin  (CRESTOR ) 5 MG tablet Take 1 tablet (5 mg total) by mouth every morning. 09/29/23   Reddick, Johnathan B, NP    Allergies: Metformin and related    Review of Systems  Updated Vital Signs BP (!) 180/50 (BP Location: Right Arm)   Pulse 64   Temp 97.6 F (36.4 C) (Oral)   Resp 12   SpO2 100%   Physical Exam  (all labs ordered are listed, but only abnormal results are displayed) Labs Reviewed  CBG MONITORING, ED - Abnormal; Notable for the following components:      Result Value   Glucose-Capillary 101 (*)    All other components within normal limits  BASIC METABOLIC PANEL WITH GFR    EKG: None  Radiology: No results found.  {Document cardiac monitor, telemetry assessment procedure when appropriate:32947} Procedures   Medications Ordered in the ED - No data to display    {Click here for ABCD2, HEART and other calculators REFRESH Note before signing:1}                                78 year old female with a  history of type 2 diabetes, history of colon cancer, hypertension, hyperlipidemia, meningioma who presents with concern for altered mental status, found to have hypoglycemia with EMS, in the setting of a few days of increased confusion.  {Document critical care time when appropriate  Document review of labs and clinical decision tools ie CHADS2VASC2, etc  Document your independent review of radiology images and any outside records  Document your discussion with family members, caretakers and with consultants  Document social determinants of health affecting pt's care  Document your decision making why or why not admission, treatments were needed:32947:::1}   Final diagnoses:  None    ED Discharge Orders     None

## 2023-11-02 LAB — I-STAT CG4 LACTIC ACID, ED: Lactic Acid, Venous: 1.8 mmol/L (ref 0.5–1.9)

## 2023-11-03 ENCOUNTER — Ambulatory Visit: Admitting: Family Medicine

## 2023-11-03 NOTE — Progress Notes (Signed)
 Error

## 2023-11-08 DIAGNOSIS — I10 Essential (primary) hypertension: Secondary | ICD-10-CM | POA: Diagnosis not present

## 2023-11-08 DIAGNOSIS — E118 Type 2 diabetes mellitus with unspecified complications: Secondary | ICD-10-CM | POA: Diagnosis not present

## 2023-11-08 DIAGNOSIS — E7800 Pure hypercholesterolemia, unspecified: Secondary | ICD-10-CM | POA: Diagnosis not present

## 2023-11-17 ENCOUNTER — Inpatient Hospital Stay

## 2023-11-22 ENCOUNTER — Encounter (INDEPENDENT_AMBULATORY_CARE_PROVIDER_SITE_OTHER): Admitting: Ophthalmology

## 2023-11-22 DIAGNOSIS — H43813 Vitreous degeneration, bilateral: Secondary | ICD-10-CM | POA: Diagnosis not present

## 2023-11-22 DIAGNOSIS — I10 Essential (primary) hypertension: Secondary | ICD-10-CM | POA: Diagnosis not present

## 2023-11-22 DIAGNOSIS — Z794 Long term (current) use of insulin: Secondary | ICD-10-CM

## 2023-11-22 DIAGNOSIS — E113512 Type 2 diabetes mellitus with proliferative diabetic retinopathy with macular edema, left eye: Secondary | ICD-10-CM | POA: Diagnosis not present

## 2023-11-22 DIAGNOSIS — H35033 Hypertensive retinopathy, bilateral: Secondary | ICD-10-CM

## 2023-11-22 DIAGNOSIS — E113591 Type 2 diabetes mellitus with proliferative diabetic retinopathy without macular edema, right eye: Secondary | ICD-10-CM

## 2023-12-15 ENCOUNTER — Encounter: Payer: Self-pay | Admitting: Cardiovascular Disease

## 2023-12-15 ENCOUNTER — Other Ambulatory Visit: Payer: Self-pay | Admitting: Hematology and Oncology

## 2023-12-15 ENCOUNTER — Inpatient Hospital Stay: Attending: Hematology and Oncology | Admitting: Hematology and Oncology

## 2023-12-15 ENCOUNTER — Telehealth: Payer: Self-pay

## 2023-12-15 ENCOUNTER — Inpatient Hospital Stay

## 2023-12-15 ENCOUNTER — Encounter: Payer: Self-pay | Admitting: Hematology and Oncology

## 2023-12-15 VITALS — BP 160/63 | HR 59 | Temp 97.9°F | Resp 18 | Ht 63.0 in | Wt 121.0 lb

## 2023-12-15 DIAGNOSIS — Z9049 Acquired absence of other specified parts of digestive tract: Secondary | ICD-10-CM | POA: Diagnosis not present

## 2023-12-15 DIAGNOSIS — I129 Hypertensive chronic kidney disease with stage 1 through stage 4 chronic kidney disease, or unspecified chronic kidney disease: Secondary | ICD-10-CM | POA: Diagnosis not present

## 2023-12-15 DIAGNOSIS — E1122 Type 2 diabetes mellitus with diabetic chronic kidney disease: Secondary | ICD-10-CM | POA: Diagnosis not present

## 2023-12-15 DIAGNOSIS — D539 Nutritional anemia, unspecified: Secondary | ICD-10-CM | POA: Diagnosis not present

## 2023-12-15 DIAGNOSIS — K222 Esophageal obstruction: Secondary | ICD-10-CM | POA: Insufficient documentation

## 2023-12-15 DIAGNOSIS — Z85038 Personal history of other malignant neoplasm of large intestine: Secondary | ICD-10-CM | POA: Diagnosis not present

## 2023-12-15 DIAGNOSIS — I152 Hypertension secondary to endocrine disorders: Secondary | ICD-10-CM

## 2023-12-15 DIAGNOSIS — E1159 Type 2 diabetes mellitus with other circulatory complications: Secondary | ICD-10-CM

## 2023-12-15 DIAGNOSIS — I2510 Atherosclerotic heart disease of native coronary artery without angina pectoris: Secondary | ICD-10-CM | POA: Insufficient documentation

## 2023-12-15 DIAGNOSIS — D631 Anemia in chronic kidney disease: Secondary | ICD-10-CM | POA: Diagnosis not present

## 2023-12-15 DIAGNOSIS — I160 Hypertensive urgency: Secondary | ICD-10-CM | POA: Diagnosis not present

## 2023-12-15 DIAGNOSIS — N1832 Chronic kidney disease, stage 3b: Secondary | ICD-10-CM | POA: Diagnosis not present

## 2023-12-15 DIAGNOSIS — Z79899 Other long term (current) drug therapy: Secondary | ICD-10-CM | POA: Insufficient documentation

## 2023-12-15 LAB — CBC WITH DIFFERENTIAL/PLATELET
Abs Immature Granulocytes: 0.03 K/uL (ref 0.00–0.07)
Basophils Absolute: 0 K/uL (ref 0.0–0.1)
Basophils Relative: 1 %
Eosinophils Absolute: 0 K/uL (ref 0.0–0.5)
Eosinophils Relative: 1 %
HCT: 32.3 % — ABNORMAL LOW (ref 36.0–46.0)
Hemoglobin: 10.4 g/dL — ABNORMAL LOW (ref 12.0–15.0)
Immature Granulocytes: 1 %
Lymphocytes Relative: 24 %
Lymphs Abs: 1.6 K/uL (ref 0.7–4.0)
MCH: 29 pg (ref 26.0–34.0)
MCHC: 32.2 g/dL (ref 30.0–36.0)
MCV: 90 fL (ref 80.0–100.0)
Monocytes Absolute: 0.4 K/uL (ref 0.1–1.0)
Monocytes Relative: 6 %
Neutro Abs: 4.5 K/uL (ref 1.7–7.7)
Neutrophils Relative %: 67 %
Platelets: 190 K/uL (ref 150–400)
RBC: 3.59 MIL/uL — ABNORMAL LOW (ref 3.87–5.11)
RDW: 13.1 % (ref 11.5–15.5)
WBC: 6.7 K/uL (ref 4.0–10.5)
nRBC: 0 % (ref 0.0–0.2)

## 2023-12-15 MED ORDER — ISOSORBIDE MONONITRATE ER 60 MG PO TB24
60.0000 mg | ORAL_TABLET | Freq: Every day | ORAL | 0 refills | Status: DC
Start: 2023-12-15 — End: 2024-01-16

## 2023-12-15 MED ORDER — LOSARTAN POTASSIUM-HCTZ 50-12.5 MG PO TABS: 1.0000 | ORAL_TABLET | Freq: Every day | ORAL | 0 refills | Status: AC

## 2023-12-15 NOTE — Telephone Encounter (Signed)
 As per Dr. Lonn, called [facility] Dr. Dorn Dames to see if they will send a letter out to contact patient to have her call in to make an appointment with them to follow up on blood pressure medications. Spoke with Jasmine she stated she would have a letter sent out to the patients home. Patients contact number is to her daughter and she is not answering calls from the Cardiology office, this is the reason for the letter being sent out to the patient.

## 2023-12-15 NOTE — Assessment & Plan Note (Addendum)
 The patient was not able to get her medications refill through her cardiologist office due to lack of appointments Apparently, the cardiologist office has been trying to get hold of the patient and was not able to do so I investigated further and it appears that the listed phone number is her daughters number and she has not been answering her phone I advised the patient to call her daughter to make sure that she is able to get her appointment scheduled I will enlist the help from my medical assistant to contact the cardiologist office I refilled 2 of her blood pressure medications today but I am clear to tell the patient that she would not get refills from me and it would only be for 1 month supply until she establish her care back with her cardiologist If not, she can get refills through her primary care doctor

## 2023-12-15 NOTE — Progress Notes (Signed)
 [facility] OFFICE PROGRESS NOTE  Rolinda Millman, MD  ASSESSMENT & PLAN:  Assessment & Plan Deficiency anemia Overall, she has anemia of chronic renal failure She has skipped several injection over the past 12 months Based on the trend of her recent blood work, I plan to reduce the dose to 10,000 units once a month She is due for injection today but due to hypertension, I will skip her treatment and I will see her again in a few months for further follow-up Hypertension associated with diabetes (HCC) The patient was not able to get her medications refill through her cardiologist office due to lack of appointments Apparently, the cardiologist office has been trying to get hold of the patient and was not able to do so I investigated further and it appears that the listed phone number is her daughters number and she has not been answering her phone I advised the patient to call her daughter to make sure that she is able to get her appointment scheduled I will enlist the help from my medical assistant to contact the cardiologist office I refilled 2 of her blood pressure medications today but I am clear to tell the patient that she would not get refills from me and it would only be for 1 month supply until she establish her care back with her cardiologist If not, she can get refills through her primary care doctor    No orders of the defined types were placed in this encounter.   INTERVAL HISTORY: Patient returns for recurrent anemia Symptoms of anemia includes none We reviewed CBC results today The patient appears to have elevated blood pressure and she informed me she had ran out of her prescription of antihypertensive more than a month ago  SUMMARY OF HEMATOLOGIC HISTORY:  The patient was seen by multiple different physicians in the past She was originally seen by Dr. Townsend, and then Dr. Niki, and last seen here in 2016 by Dr. Cloretta She has remote history of  colon cancer diagnosed in 2006, stage II (T3 N0), status post a right colectomy 03/07/2005. She received adjuvant FOLFOX for 4 cycles followed by 8 cycles of 5-FU/leucovorin.  She had imaging study with CT scan of the abdomen and pelvis in 2019 which show no evidence of recurrence.  Her last colonoscopy was on July 14, 2016 which show no evidence of disease.  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  No other abnormalities were noted She also have history of meningioma and follow with neurosurgery for periodic imaging study.  Her last MRI of the brain was in Aug 11, 2015 which show stable meningioma  She was found to have abnormal CBC from recent blood work.  On review of her blood work, it is noted that she has progressive anemia over the last few years, low was around 8.8 In July of this year, she had iron studies which showed normal iron storage.  She is noted to have progressive renal disease. Starting around 2018, she had progressive renal dysfunction, with serum creatinine fluctuate usually under 2 but in January of this year, her creatinine went up to as high as 2.2. Estimated EGFR put her at chronic kidney disease stage III.  She has multiple cardiovascular risk factors including poorly controlled diabetes, peripheral vascular disease, chronic kidney disease, coronary artery disease and hypertension  She denies recent chest pain on exertion, shortness of  breath on minimal exertion, pre-syncopal episodes, or palpitations. She had not noticed any recent bleeding such as epistaxis, hematuria or hematochezia The patient denies over the counter NSAID ingestion. She is on antiplatelets agents.  She denies any pica and eats a variety of diet. She had received blood transfusion when she was diagnosed with colon cancer Her granddaughter noticed recurrent falls.  She denies generalized weakness.  She denies peripheral  neuropathy from prior treatment or diabetes She is started on Epoetin  injection for anemia chronic kidney disease on February 10, 2020   Lab Results  Component Value Date   VITAMINB12 1,197 (H) 10/31/2023   FERRITIN 75 07/28/2023   HGB 10.4 (L) 12/15/2023   RBC 3.59 (L) 12/15/2023   Vitals:   12/15/23 1020  BP: (!) 160/63  Pulse: (!) 59  Resp: 18  Temp: 97.9 F (36.6 C)  SpO2: 100%

## 2023-12-15 NOTE — Assessment & Plan Note (Addendum)
 Overall, she has anemia of chronic renal failure She has skipped several injection over the past 12 months Based on the trend of her recent blood work, I plan to reduce the dose to 10,000 units once a month She is due for injection today but due to hypertension, I will skip her treatment and I will see her again in a few months for further follow-up

## 2023-12-20 ENCOUNTER — Encounter (INDEPENDENT_AMBULATORY_CARE_PROVIDER_SITE_OTHER): Admitting: Ophthalmology

## 2023-12-20 DIAGNOSIS — Z794 Long term (current) use of insulin: Secondary | ICD-10-CM

## 2023-12-20 DIAGNOSIS — E113591 Type 2 diabetes mellitus with proliferative diabetic retinopathy without macular edema, right eye: Secondary | ICD-10-CM | POA: Diagnosis not present

## 2023-12-20 DIAGNOSIS — I10 Essential (primary) hypertension: Secondary | ICD-10-CM | POA: Diagnosis not present

## 2023-12-20 DIAGNOSIS — E113512 Type 2 diabetes mellitus with proliferative diabetic retinopathy with macular edema, left eye: Secondary | ICD-10-CM

## 2023-12-20 DIAGNOSIS — H35033 Hypertensive retinopathy, bilateral: Secondary | ICD-10-CM | POA: Diagnosis not present

## 2023-12-20 DIAGNOSIS — H43813 Vitreous degeneration, bilateral: Secondary | ICD-10-CM | POA: Diagnosis not present

## 2023-12-29 DIAGNOSIS — Z7409 Other reduced mobility: Secondary | ICD-10-CM | POA: Diagnosis not present

## 2023-12-29 DIAGNOSIS — E538 Deficiency of other specified B group vitamins: Secondary | ICD-10-CM | POA: Diagnosis not present

## 2023-12-29 DIAGNOSIS — I25118 Atherosclerotic heart disease of native coronary artery with other forms of angina pectoris: Secondary | ICD-10-CM | POA: Diagnosis not present

## 2023-12-29 DIAGNOSIS — E113512 Type 2 diabetes mellitus with proliferative diabetic retinopathy with macular edema, left eye: Secondary | ICD-10-CM | POA: Diagnosis not present

## 2023-12-29 DIAGNOSIS — E7800 Pure hypercholesterolemia, unspecified: Secondary | ICD-10-CM | POA: Diagnosis not present

## 2023-12-29 DIAGNOSIS — Z23 Encounter for immunization: Secondary | ICD-10-CM | POA: Diagnosis not present

## 2023-12-29 DIAGNOSIS — I10 Essential (primary) hypertension: Secondary | ICD-10-CM | POA: Diagnosis not present

## 2023-12-29 DIAGNOSIS — I739 Peripheral vascular disease, unspecified: Secondary | ICD-10-CM | POA: Diagnosis not present

## 2024-01-01 ENCOUNTER — Telehealth: Payer: Self-pay

## 2024-01-01 DIAGNOSIS — E113512 Type 2 diabetes mellitus with proliferative diabetic retinopathy with macular edema, left eye: Secondary | ICD-10-CM

## 2024-01-03 ENCOUNTER — Telehealth: Payer: Self-pay | Admitting: *Deleted

## 2024-01-03 NOTE — Progress Notes (Signed)
 Complex Care Management Note  Care Guide Note 01/03/2024 Name: [name]. Meany MRN: [id number] DOB: 1945/10/21  [name] is a 78 y.o. year old female who sees Hagler, Vernell, MD for primary care. I reached out to [facility] by phone today to offer complex care management services.  [name] was given information about Complex Care Management services today including:   The Complex Care Management services include support from the care team which includes your Nurse Care Manager, Clinical Social Worker, or Pharmacist.  The Complex Care Management team is here to help remove barriers to the health concerns and goals most important to you. Complex Care Management services are voluntary, and the patient may decline or stop services at any time by request to their care team member.   Complex Care Management Consent Status: Patient agreed to services and verbal consent obtained.   Follow up plan:  Telephone appointment with complex care management team member scheduled for:  10/1  Encounter Outcome:  Patient Scheduled  Harlene Satterfield  [facility]  [facility], [facility] Guide  Direct Dial: [phone]  Fax [phone]

## 2024-01-08 ENCOUNTER — Encounter: Payer: Self-pay | Admitting: Cardiovascular Disease

## 2024-01-08 ENCOUNTER — Ambulatory Visit: Attending: Cardiovascular Disease | Admitting: Cardiovascular Disease

## 2024-01-08 VITALS — BP 132/56 | HR 59 | Ht 61.0 in | Wt 123.0 lb

## 2024-01-08 DIAGNOSIS — I739 Peripheral vascular disease, unspecified: Secondary | ICD-10-CM

## 2024-01-08 NOTE — Patient Instructions (Signed)
 Medication Instructions:  Your physician recommends that you continue on your current medications as directed. Please refer to the Current Medication list given to you today.  *If you need a refill on your cardiac medications before your next appointment, please call your pharmacy*  Testing/Procedures: Your physician has requested that you have a lower extremity arterial duplex. During this test, ultrasound is used to evaluate arterial blood flow in the legs. Allow one hour for this exam. There are no restrictions or special instructions. This will take place at [address] 4th floor  Please note: We ask at that you not bring children with you during ultrasound (echo/ vascular) testing. Due to room size and safety concerns, children are not allowed in the ultrasound rooms during exams. Our front office staff cannot provide observation of children in our lobby area while testing is being conducted. An adult accompanying a patient to their appointment will only be allowed in the ultrasound room at the discretion of the ultrasound technician under special circumstances. We apologize for any inconvenience.   Your physician has requested that you have an ankle brachial index (ABI). During this test an ultrasound and blood pressure cuff are used to evaluate the arteries that supply the arms and legs with blood. Allow thirty minutes for this exam. There are no restrictions or special instructions. This will take place at [address], 4th floor   Please note: We ask at that you not bring children with you during ultrasound (echo/ vascular) testing. Due to room size and safety concerns, children are not allowed in the ultrasound rooms during exams. Our front office staff cannot provide observation of children in our lobby area while testing is being conducted. An adult accompanying a patient to their appointment will only be allowed in the ultrasound room at the discretion of the ultrasound  technician under special circumstances. We apologize for any inconvenience.   Follow-Up: At [facility], you and your health needs are our priority.  As part of our continuing mission to provide you with exceptional heart care, our providers are all part of one team.  This team includes your primary Cardiologist (physician) and Advanced Practice Providers or APPs (Physician Assistants and Nurse Practitioners) who all work together to provide you with the care you need, when you need it.  Your next appointment:   12 month(s)  Provider:   Dorn Lesches, MD    We recommend signing up for the patient portal called MyChart.  Sign up information is provided on this After Visit Summary.  MyChart is used to connect with patients for Virtual Visits (Telemedicine).  Patients are able to view lab/test results, encounter notes, upcoming appointments, etc.  Non-urgent messages can be sent to your provider as well.   To learn more about what you can do with MyChart, go to [email or web address].

## 2024-01-08 NOTE — Progress Notes (Signed)
 01/08/2024 [name]   04-19-1945  [id number]  Primary Physician Anne Millman, MD Primary Cardiologist: Anne JINNY Lesches MD FACP, Jette, [place], FSCAI  HPI:  [name]. [name] is a 78 y.o.   mildly overweight married African-American female mother of 1 living daughter (son committed suicide), grandmother to 6 grandchildren referred by Dr. Claudene, her cardiologist, for peripheral vascular evaluation.  I last saw her in the office 06/11/2021.  She is accompanied by her daughter [name] today.  In her younger years she worked for the [employer] and then for [employer].  Her risk factors include treated hypertension, diabetes and hyperlipidemia.  She is never had a heart attack or stroke.  She has had a diagnostic cath performed 04/17/2018 that showed some mild CAD.  She had COVID-19 back in January of this year recovered.  She does complain of lifestyle limiting claudication with Dopplers that were performed 01/29/2020 revealing a right ABI of 1.09 and a left of 0.89 with high-frequency signals in both SFAs.  I performed peripheral angiography on her 03/23/2020 revealing 70% segmental mid left SFA stenosis.  I performed [facility] 1 directional atherectomy followed by drug-coated balloon angioplasty with excellent angiographic and clinical result.  Her most recent Doppler studies performed 12/16/2020 revealed a right ABI of 0.97 and a left of 1.01 with a widely patent left SFA.  She wishes to proceed with right SFA intervention.  I performed peripheral vascular angiogram and intervention on her 05/31/2021 of her mid right SFA.  She did have an occluded anterior tibial on that side.  I performed directional atherectomy followed by drug-coated balloon angioplasty with excellent result.  Her follow-up lower extremity arterial Doppler studies performed 06/10/2021 showed marked improvement.  Her claudication on the right side has resolved.    Since I saw her 2-1/2 years ago she has remained  stable.  She denies chest pain, shortness of breath or claudication.  Her last Doppler studies performed 01/03/2022 revealed widely patent SFAs bilaterally.   Current Meds  Medication Sig   amLODipine  (NORVASC ) 5 MG tablet Take 1 tablet (5 mg total) by mouth daily. <PLEASE MAKE APPOINTMENT FOR REFILLS>   aspirin  81 MG chewable tablet Chew 81 mg by mouth daily.   clopidogrel  (PLAVIX ) 75 MG tablet Take 1 tablet (75 mg total) by mouth every morning.   famotidine  (PEPCID ) 20 MG tablet TAKE 1 TABLET(20 MG) BY MOUTH TWICE DAILY AS NEEDED FOR HEARTBURN OR INDIGESTION   GLYXAMBI  25-5 MG TABS Take 1 tablet by mouth every morning.   isosorbide  mononitrate (IMDUR ) 60 MG 24 hr tablet Take 1 tablet (60 mg total) by mouth daily. <PLEASE MAKE APPOINTMENT FOR REFILLS>   losartan -hydrochlorothiazide  (HYZAAR) 50-12.5 MG tablet Take 1 tablet by mouth daily. Please do not send refills to me she needs cardiology appointment   meclizine (ANTIVERT) 25 MG tablet SMARTSIG:1 Tablet(s) By Mouth Every 12 Hours PRN   nitroGLYCERIN  (NITROSTAT ) 0.4 MG SL tablet Place 1 tablet (0.4 mg total) under the tongue every 5 (five) minutes as needed for chest pain.   polyethylene glycol powder (GLYCOLAX /MIRALAX ) 17 GM/SCOOP powder Take 17 g by mouth daily.   PRODIGY NO CODING BLOOD GLUC test strip Use as instructed   Prodigy Twist Top Lancets 28G MISC 1 each by Other route daily as needed.   rosuvastatin  (CRESTOR ) 5 MG tablet Take 1 tablet (5 mg total) by mouth every morning.     Allergies  Allergen Reactions   Metformin And Related Diarrhea  Social History   Socioeconomic History   Marital status: Legally Separated    Spouse name: Not on file   Number of children: 2   Years of education: Not on file   Highest education level: Not on file  Occupational History   Not on file  Tobacco Use   Smoking status: Never   Smokeless tobacco: Never  Vaping Use   Vaping status: Never Used  Substance and Sexual Activity    Alcohol use: No   Drug use: No   Sexual activity: Not on file  Other Topics Concern   Not on file  Social History Narrative   Right Handed   1-2 Cups of Coffee   Social Drivers of Health   Financial Resource Strain: Not on file  Food Insecurity: No Food Insecurity (10/22/2021)   Hunger Vital Sign    Worried About Running Out of Food in the Last Year: Never true    Ran Out of Food in the Last Year: Never true  Transportation Needs: No Transportation Needs (10/22/2021)   PRAPARE - Administrator, Civil Service (Medical): No    Lack of Transportation (Non-Medical): No  Physical Activity: Not on file  Stress: Not on file  Social Connections: Not on file  Intimate Partner Violence: Not At Risk (10/22/2021)   Humiliation, Afraid, Rape, and Kick questionnaire    Fear of Current or Ex-Partner: No    Emotionally Abused: No    Physically Abused: No    Sexually Abused: No     Review of Systems: General: negative for chills, fever, night sweats or weight changes.  Cardiovascular: negative for chest pain, dyspnea on exertion, edema, orthopnea, palpitations, paroxysmal nocturnal dyspnea or shortness of breath Dermatological: negative for rash Respiratory: negative for cough or wheezing Urologic: negative for hematuria Abdominal: negative for nausea, vomiting, diarrhea, bright red blood per rectum, melena, or hematemesis Neurologic: negative for visual changes, syncope, or dizziness All other systems reviewed and are otherwise negative except as noted above.    Blood pressure (!) 132/56, pulse (!) 59, height 5' 1 (1.549 m), weight 123 lb (55.8 kg), SpO2 99%.  General appearance: alert and no distress Neck: no adenopathy, no carotid bruit, no JVD, supple, symmetrical, trachea midline, and thyroid  not enlarged, symmetric, no tenderness/mass/nodules Lungs: clear to auscultation bilaterally Heart: regular rate and rhythm, S1, S2 normal, no murmur, click, rub or  gallop Extremities: extremities normal, atraumatic, no cyanosis or edema Pulses: 2+ and symmetric Skin: Skin color, texture, turgor normal. No rashes or lesions Neurologic: Grossly normal  EKG not performed today      ASSESSMENT AND PLAN:   Peripheral arterial disease Ms. Larios returns today for follow-up of PAD.  I last saw her 2-1/2 years ago.  I performed bilateral SFA intervention on her 03/24/2019 one of her mid left SFA and 05/31/2021 of her mid right SFA.  Her last lower extremity arterial Doppler studies performed 01/03/2022 revealed widely patent SFA sites with a right ABI of 0.91 and left of 0.80.  She denies claudication.  Will repeat lower extremity arterial Doppler studies.     Anne DOROTHA Lesches MD FACP,FACC,FAHA, [facility] 01/08/2024 2:50 PM

## 2024-01-08 NOTE — Assessment & Plan Note (Signed)
 [name] returns today for follow-up of PAD.  I last saw her 2-1/2 years ago.  I performed bilateral SFA intervention on her 03/24/2019 one of her mid left SFA and 05/31/2021 of her mid right SFA.  Her last lower extremity arterial Doppler studies performed 01/03/2022 revealed widely patent SFA sites with a right ABI of 0.91 and left of 0.80.  She denies claudication.  Will repeat lower extremity arterial Doppler studies.

## 2024-01-10 ENCOUNTER — Other Ambulatory Visit: Payer: Self-pay

## 2024-01-10 NOTE — Patient Outreach (Signed)
 Contacted [name], daughter to patient [name].  Denies needs today, states Dr. Rolinda ordered a talking glucometer and they have already picked it up.  Patient has already been connected to [facility] case manager, someone will be coming to their home to visit, so declines a conference call with [facility]  Discussed [employer] money, they are aware to use the money before it expires, they were actually out shopping today to use the [employer] funds.  No other concerns today.

## 2024-01-16 ENCOUNTER — Other Ambulatory Visit: Payer: Self-pay | Admitting: Hematology and Oncology

## 2024-01-17 ENCOUNTER — Encounter (INDEPENDENT_AMBULATORY_CARE_PROVIDER_SITE_OTHER): Admitting: Ophthalmology

## 2024-01-17 DIAGNOSIS — E113512 Type 2 diabetes mellitus with proliferative diabetic retinopathy with macular edema, left eye: Secondary | ICD-10-CM

## 2024-01-17 DIAGNOSIS — H35033 Hypertensive retinopathy, bilateral: Secondary | ICD-10-CM

## 2024-01-17 DIAGNOSIS — H43813 Vitreous degeneration, bilateral: Secondary | ICD-10-CM

## 2024-01-17 DIAGNOSIS — Z794 Long term (current) use of insulin: Secondary | ICD-10-CM | POA: Diagnosis not present

## 2024-01-17 DIAGNOSIS — E113591 Type 2 diabetes mellitus with proliferative diabetic retinopathy without macular edema, right eye: Secondary | ICD-10-CM | POA: Diagnosis not present

## 2024-01-17 DIAGNOSIS — I10 Essential (primary) hypertension: Secondary | ICD-10-CM | POA: Diagnosis not present

## 2024-01-22 ENCOUNTER — Ambulatory Visit: Payer: Self-pay | Admitting: Cardiovascular Disease

## 2024-01-22 ENCOUNTER — Ambulatory Visit (HOSPITAL_COMMUNITY)
Admission: RE | Admit: 2024-01-22 | Discharge: 2024-01-22 | Disposition: A | Discharge status: Home / Self Care | Source: Ambulatory Visit | Attending: Cardiovascular Disease | Admitting: Cardiovascular Disease

## 2024-01-22 ENCOUNTER — Ambulatory Visit (HOSPITAL_BASED_OUTPATIENT_CLINIC_OR_DEPARTMENT_OTHER)
Admission: RE | Admit: 2024-01-22 | Discharge: 2024-01-22 | Disposition: A | Discharge status: Home / Self Care | Source: Ambulatory Visit | Attending: Cardiovascular Disease | Admitting: Cardiovascular Disease

## 2024-01-22 DIAGNOSIS — I739 Peripheral vascular disease, unspecified: Secondary | ICD-10-CM | POA: Insufficient documentation

## 2024-01-22 LAB — VAS US ABI WITH/WO TBI
Left ABI: 0.89
Right ABI: 1.01

## 2024-02-14 ENCOUNTER — Encounter (INDEPENDENT_AMBULATORY_CARE_PROVIDER_SITE_OTHER): Admitting: Ophthalmology

## 2024-02-14 DIAGNOSIS — E113591 Type 2 diabetes mellitus with proliferative diabetic retinopathy without macular edema, right eye: Secondary | ICD-10-CM

## 2024-02-14 DIAGNOSIS — I10 Essential (primary) hypertension: Secondary | ICD-10-CM

## 2024-02-14 DIAGNOSIS — H35033 Hypertensive retinopathy, bilateral: Secondary | ICD-10-CM

## 2024-02-14 DIAGNOSIS — H43813 Vitreous degeneration, bilateral: Secondary | ICD-10-CM

## 2024-02-14 DIAGNOSIS — E113512 Type 2 diabetes mellitus with proliferative diabetic retinopathy with macular edema, left eye: Secondary | ICD-10-CM | POA: Diagnosis not present

## 2024-02-14 DIAGNOSIS — Z794 Long term (current) use of insulin: Secondary | ICD-10-CM | POA: Diagnosis not present

## 2024-03-13 ENCOUNTER — Encounter (INDEPENDENT_AMBULATORY_CARE_PROVIDER_SITE_OTHER): Admitting: Ophthalmology

## 2024-03-13 DIAGNOSIS — Z794 Long term (current) use of insulin: Secondary | ICD-10-CM

## 2024-03-13 DIAGNOSIS — I10 Essential (primary) hypertension: Secondary | ICD-10-CM

## 2024-03-13 DIAGNOSIS — H43813 Vitreous degeneration, bilateral: Secondary | ICD-10-CM

## 2024-03-13 DIAGNOSIS — H35033 Hypertensive retinopathy, bilateral: Secondary | ICD-10-CM

## 2024-03-13 DIAGNOSIS — E113591 Type 2 diabetes mellitus with proliferative diabetic retinopathy without macular edema, right eye: Secondary | ICD-10-CM

## 2024-03-13 DIAGNOSIS — H4312 Vitreous hemorrhage, left eye: Secondary | ICD-10-CM

## 2024-03-13 DIAGNOSIS — E113512 Type 2 diabetes mellitus with proliferative diabetic retinopathy with macular edema, left eye: Secondary | ICD-10-CM | POA: Diagnosis not present

## 2024-03-15 ENCOUNTER — Inpatient Hospital Stay

## 2024-03-15 ENCOUNTER — Inpatient Hospital Stay: Attending: Hematology and Oncology

## 2024-03-15 ENCOUNTER — Inpatient Hospital Stay: Admitting: Hematology and Oncology

## 2024-03-21 ENCOUNTER — Inpatient Hospital Stay: Attending: Hematology and Oncology

## 2024-03-21 ENCOUNTER — Encounter: Payer: Self-pay | Admitting: Hematology and Oncology

## 2024-03-21 ENCOUNTER — Inpatient Hospital Stay: Admitting: Hematology and Oncology

## 2024-03-21 ENCOUNTER — Inpatient Hospital Stay

## 2024-03-21 VITALS — BP 146/51 | HR 59 | Temp 98.9°F | Resp 17 | Ht 61.0 in | Wt 139.4 lb

## 2024-03-21 DIAGNOSIS — D631 Anemia in chronic kidney disease: Secondary | ICD-10-CM | POA: Diagnosis present

## 2024-03-21 DIAGNOSIS — D539 Nutritional anemia, unspecified: Secondary | ICD-10-CM

## 2024-03-21 DIAGNOSIS — N1832 Chronic kidney disease, stage 3b: Secondary | ICD-10-CM

## 2024-03-21 LAB — CBC WITH DIFFERENTIAL/PLATELET
Abs Immature Granulocytes: 0.05 K/uL (ref 0.00–0.07)
Basophils Absolute: 0 K/uL (ref 0.0–0.1)
Basophils Relative: 0 %
Eosinophils Absolute: 0.1 K/uL (ref 0.0–0.5)
Eosinophils Relative: 2 %
HCT: 30.3 % — ABNORMAL LOW (ref 36.0–46.0)
Hemoglobin: 9.8 g/dL — ABNORMAL LOW (ref 12.0–15.0)
Immature Granulocytes: 1 %
Lymphocytes Relative: 25 %
Lymphs Abs: 1.7 K/uL (ref 0.7–4.0)
MCH: 29.1 pg (ref 26.0–34.0)
MCHC: 32.3 g/dL (ref 30.0–36.0)
MCV: 89.9 fL (ref 80.0–100.0)
Monocytes Absolute: 0.6 K/uL (ref 0.1–1.0)
Monocytes Relative: 8 %
Neutro Abs: 4.5 K/uL (ref 1.7–7.7)
Neutrophils Relative %: 64 %
Platelets: 185 K/uL (ref 150–400)
RBC: 3.37 MIL/uL — ABNORMAL LOW (ref 3.87–5.11)
RDW: 13.2 % (ref 11.5–15.5)
WBC: 7 K/uL (ref 4.0–10.5)
nRBC: 0 % (ref 0.0–0.2)

## 2024-03-21 MED ADMIN — Epoetin Alfa-epbx Inj 10000 Unit/ML: 10000 [IU] | SUBCUTANEOUS | NDC 00069130801

## 2024-03-21 MED FILL — Epoetin Alfa-epbx Inj 10000 Unit/ML: 10000.0000 [IU] | INTRAMUSCULAR | Qty: 1 | Status: AC

## 2024-03-21 NOTE — Progress Notes (Signed)
° °  [facility] OFFICE PROGRESS NOTE  Anne Millman, MD  ASSESSMENT & PLAN:  Assessment & Plan Deficiency anemia Overall, she has anemia of chronic renal failure She has skipped several injection over the past 12 months Based on the trend of her recent blood work, I plan to reduce the dose to 10,000 units once every 55-month  She is doing well and has no side effects from the injections    No orders of the defined types were placed in this encounter.   INTERVAL HISTORY: Patient returns for recurrent anemia Symptoms of anemia includes none We reviewed CBC result today  SUMMARY OF HEMATOLOGIC HISTORY:  The patient was seen by multiple different physicians in the past She was originally seen by Dr. Townsend, and then Dr. Niki, and last seen here in 2016 by Dr. Cloretta She has remote history of colon cancer diagnosed in 2006, stage II (T3 N0), status post a right colectomy 03/07/2005. She received adjuvant FOLFOX for 4 cycles followed by 8 cycles of 5-FU/leucovorin.  She had imaging study with CT scan of the abdomen and pelvis in 2019 which show no evidence of recurrence.  Her last colonoscopy was on July 14, 2016 which show no evidence of disease.  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  No other abnormalities were noted She also have history of meningioma and follow with neurosurgery for periodic imaging study.  Her last MRI of the brain was in Aug 11, 2015 which show stable meningioma  She was found to have abnormal CBC from recent blood work.  On review of her blood work, it is noted that she has progressive anemia over the last few years, low was around 8.8 In July of this year, she had iron studies which showed normal iron storage.  She is noted to have progressive renal disease. Starting around 2018, she had progressive renal dysfunction, with serum creatinine fluctuate usually under 2  but in January of this year, her creatinine went up to as high as 2.2. Estimated EGFR put her at chronic kidney disease stage III.  She has multiple cardiovascular risk factors including poorly controlled diabetes, peripheral vascular disease, chronic kidney disease, coronary artery disease and hypertension  She denies recent chest pain on exertion, shortness of breath on minimal exertion, pre-syncopal episodes, or palpitations. She had not noticed any recent bleeding such as epistaxis, hematuria or hematochezia The patient denies over the counter NSAID ingestion. She is on antiplatelets agents.  She denies any pica and eats a variety of diet. She had received blood transfusion when she was diagnosed with colon cancer Her granddaughter noticed recurrent falls.  She denies generalized weakness.  She denies peripheral neuropathy from prior treatment or diabetes She is started on Epoetin  injection for anemia chronic kidney disease on February 10, 2020  Lab Results  Component Value Date   VITAMINB12 1,197 (H) 10/31/2023   FERRITIN 75 07/28/2023   HGB 9.8 (L) 03/21/2024   RBC 3.37 (L) 03/21/2024   Vitals:   03/21/24 0927 03/21/24 0932  BP: (!) 151/45 (!) 146/51  Pulse: (!) 59   Resp: 17   Temp: 98.9 F (37.2 C)   SpO2: 100%

## 2024-03-21 NOTE — Assessment & Plan Note (Addendum)
 Overall, she has anemia of chronic renal failure She has skipped several injection over the past 12 months Based on the trend of her recent blood work, I plan to reduce the dose to 10,000 units once every 22-month  She is doing well and has no side effects from the injections

## 2024-04-17 ENCOUNTER — Encounter (INDEPENDENT_AMBULATORY_CARE_PROVIDER_SITE_OTHER): Admitting: Ophthalmology

## 2024-04-17 DIAGNOSIS — Z794 Long term (current) use of insulin: Secondary | ICD-10-CM | POA: Diagnosis not present

## 2024-04-17 DIAGNOSIS — E113512 Type 2 diabetes mellitus with proliferative diabetic retinopathy with macular edema, left eye: Secondary | ICD-10-CM | POA: Diagnosis not present

## 2024-04-17 DIAGNOSIS — E113591 Type 2 diabetes mellitus with proliferative diabetic retinopathy without macular edema, right eye: Secondary | ICD-10-CM | POA: Diagnosis not present

## 2024-04-17 DIAGNOSIS — H35033 Hypertensive retinopathy, bilateral: Secondary | ICD-10-CM | POA: Diagnosis not present

## 2024-04-17 DIAGNOSIS — H43813 Vitreous degeneration, bilateral: Secondary | ICD-10-CM

## 2024-04-17 DIAGNOSIS — I10 Essential (primary) hypertension: Secondary | ICD-10-CM | POA: Diagnosis not present

## 2024-05-15 ENCOUNTER — Encounter (INDEPENDENT_AMBULATORY_CARE_PROVIDER_SITE_OTHER): Admitting: Ophthalmology

## 2024-05-15 DIAGNOSIS — Z794 Long term (current) use of insulin: Secondary | ICD-10-CM | POA: Diagnosis not present

## 2024-05-15 DIAGNOSIS — H43813 Vitreous degeneration, bilateral: Secondary | ICD-10-CM

## 2024-05-15 DIAGNOSIS — E113591 Type 2 diabetes mellitus with proliferative diabetic retinopathy without macular edema, right eye: Secondary | ICD-10-CM | POA: Diagnosis not present

## 2024-05-15 DIAGNOSIS — E113512 Type 2 diabetes mellitus with proliferative diabetic retinopathy with macular edema, left eye: Secondary | ICD-10-CM

## 2024-05-15 DIAGNOSIS — I10 Essential (primary) hypertension: Secondary | ICD-10-CM

## 2024-05-15 DIAGNOSIS — H35033 Hypertensive retinopathy, bilateral: Secondary | ICD-10-CM

## 2024-06-12 ENCOUNTER — Encounter (INDEPENDENT_AMBULATORY_CARE_PROVIDER_SITE_OTHER): Admitting: Ophthalmology

## 2024-06-20 ENCOUNTER — Inpatient Hospital Stay: Attending: Hematology and Oncology

## 2024-06-20 ENCOUNTER — Inpatient Hospital Stay

## 2024-06-20 ENCOUNTER — Inpatient Hospital Stay: Admitting: Hematology and Oncology
